# Patient Record
Sex: Female | Born: 1955 | Race: White | Hispanic: No | Marital: Single | Smoking: Never smoker
Health system: Southern US, Community
[De-identification: ages and names within clinical notes are randomized; demographics above are authoritative.]

## PROBLEM LIST (undated history)

## (undated) DIAGNOSIS — C801 Malignant (primary) neoplasm, unspecified: Secondary | ICD-10-CM

## (undated) DIAGNOSIS — R011 Cardiac murmur, unspecified: Secondary | ICD-10-CM

## (undated) DIAGNOSIS — IMO0001 Reserved for inherently not codable concepts without codable children: Secondary | ICD-10-CM

## (undated) DIAGNOSIS — IMO0002 Reserved for concepts with insufficient information to code with codable children: Secondary | ICD-10-CM

## (undated) DIAGNOSIS — N939 Abnormal uterine and vaginal bleeding, unspecified: Secondary | ICD-10-CM

## (undated) DIAGNOSIS — D219 Benign neoplasm of connective and other soft tissue, unspecified: Secondary | ICD-10-CM

## (undated) DIAGNOSIS — D649 Anemia, unspecified: Secondary | ICD-10-CM

## (undated) HISTORY — DX: Abnormal uterine and vaginal bleeding, unspecified: N93.9

## (undated) HISTORY — DX: Reserved for concepts with insufficient information to code with codable children: IMO0002

## (undated) HISTORY — DX: Anemia, unspecified: D64.9

## (undated) HISTORY — DX: Benign neoplasm of connective and other soft tissue, unspecified: D21.9

## (undated) HISTORY — DX: Cardiac murmur, unspecified: R01.1

## (undated) HISTORY — DX: Reserved for inherently not codable concepts without codable children: IMO0001

---

## 2002-11-13 ENCOUNTER — Other Ambulatory Visit: Admission: RE | Admit: 2002-11-13 | Discharge: 2002-11-13 | Payer: Self-pay | Admitting: Obstetrics and Gynecology

## 2004-01-20 ENCOUNTER — Other Ambulatory Visit: Admission: RE | Admit: 2004-01-20 | Discharge: 2004-01-20 | Payer: Self-pay | Admitting: Obstetrics and Gynecology

## 2005-02-03 ENCOUNTER — Other Ambulatory Visit: Admission: RE | Admit: 2005-02-03 | Discharge: 2005-02-03 | Payer: Self-pay | Admitting: Obstetrics and Gynecology

## 2007-10-08 ENCOUNTER — Encounter (INDEPENDENT_AMBULATORY_CARE_PROVIDER_SITE_OTHER): Payer: Self-pay | Admitting: *Deleted

## 2007-10-08 ENCOUNTER — Ambulatory Visit (HOSPITAL_COMMUNITY): Admission: RE | Admit: 2007-10-08 | Discharge: 2007-10-08 | Payer: Self-pay | Admitting: *Deleted

## 2009-06-06 HISTORY — PX: LAPAROSCOPIC GASTRIC SLEEVE RESECTION: SHX5895

## 2010-07-22 ENCOUNTER — Ambulatory Visit: Payer: Self-pay | Admitting: Bariatrics

## 2010-07-22 DIAGNOSIS — R9431 Abnormal electrocardiogram [ECG] [EKG]: Secondary | ICD-10-CM

## 2010-10-19 NOTE — Op Note (Signed)
NAMESURI, TAFOLLA NO.:  [id number]   MEDICAL RECORD NO.:  [id number]          PATIENT TYPE:  AMB   LOCATION:  ENDO                         FACILITY:  [facility]   PHYSICIAN:  Georgiana Spinner, M.D.    DATE OF BIRTH:  01/24/1956   DATE OF PROCEDURE:  10/08/2007  DATE OF DISCHARGE:                               OPERATIVE REPORT   PROCEDURE:  Colonoscopy.   INDICATIONS:  Colon cancer screening.   ANESTHESIA:  Fentanyl 75 mcg, Versed 5 mg.   PROCEDURE:  With the patient mildly sedated in the left lateral  decubitus position the Pentax videoscopic colonoscope was inserted in  the rectum and passed under direct vision with pressure applied to reach  the cecum, identified by ileocecal valve and base of cecum, both of  which were photographed. From this point the colonoscope was slowly  withdrawn, taking circumferential views of colonic mucosa, stopping only  at approximately 25 cm from the anal verge at which point polyps were  seen, photographed and removed.  Using hot biopsy forceps technique,  setting of 20/150 blended current.  We then withdrew all the way to the  rectum which appeared normal on direct and showed hemorrhoids on  retroflexed view. The endoscope was straightened and withdrawn.  The  patient's vital signs, pulse oximeter remained stable.  The patient  tolerated procedure well without apparent complications.   FINDINGS:  Small polyp at 25 cm from the anal verge.  Internal  hemorrhoids, otherwise unremarkable exam.   PLAN:  Await biopsy report.  The patient will call me for results and  follow-up with me as an outpatient.           ______________________________  Georgiana Spinner, M.D.     GMO/MEDQ  D:  10/08/2007  T:  10/08/2007  Job:  [id number]

## 2010-12-22 ENCOUNTER — Ambulatory Visit: Payer: Self-pay | Admitting: Bariatrics

## 2010-12-28 ENCOUNTER — Inpatient Hospital Stay: Payer: Self-pay | Admitting: Bariatrics

## 2010-12-31 LAB — PATHOLOGY REPORT

## 2011-01-24 ENCOUNTER — Ambulatory Visit: Payer: Self-pay | Admitting: Physician Assistant

## 2011-02-05 ENCOUNTER — Ambulatory Visit: Payer: Self-pay | Admitting: Physician Assistant

## 2011-07-19 ENCOUNTER — Other Ambulatory Visit: Payer: Self-pay | Admitting: Internal Medicine

## 2011-08-18 ENCOUNTER — Other Ambulatory Visit: Payer: Self-pay | Admitting: Dermatology

## 2014-06-19 ENCOUNTER — Encounter: Payer: Self-pay | Admitting: Certified Nurse Midwife

## 2014-06-19 ENCOUNTER — Ambulatory Visit (INDEPENDENT_AMBULATORY_CARE_PROVIDER_SITE_OTHER): Payer: Federal, State, Local not specified - PPO | Admitting: Certified Nurse Midwife

## 2014-06-19 VITALS — BP 110/70 | HR 68 | Resp 16 | Ht 60.75 in | Wt 230.0 lb

## 2014-06-19 DIAGNOSIS — Z124 Encounter for screening for malignant neoplasm of cervix: Secondary | ICD-10-CM

## 2014-06-19 DIAGNOSIS — N852 Hypertrophy of uterus: Secondary | ICD-10-CM

## 2014-06-19 DIAGNOSIS — Z01419 Encounter for gynecological examination (general) (routine) without abnormal findings: Secondary | ICD-10-CM

## 2014-06-19 DIAGNOSIS — N950 Postmenopausal bleeding: Secondary | ICD-10-CM

## 2014-06-19 NOTE — Progress Notes (Signed)
Reviewed personally.  M. Suzanne Dalis Beers, MD.  

## 2014-06-19 NOTE — Patient Instructions (Signed)

## 2014-06-19 NOTE — Progress Notes (Signed)
59 y.o. G0P0000 Single  Caucasian Fe here to establish care and for problem of vaginal bleeding post menopausal and for annual exam. Patient was menopausal at age 28. Patient had gastric sleeve at age 52 and had vaginal bleeding off and on 2-3 times yearly in the past 2 years. In 2015 she had slight spotting periodic, but in 9/15 passed a large clot, with bleeding and painful occurrence with one week duration of bleeding. No bleeding since that time. Sees Dr.Pang yearly and labs. Last visit 12/15, all normal.  History of small fibroid years ago. Previous patient of Dr. Quincy Simmonds.  Patient's last menstrual period was 02/04/2014.          Sexually active: No.  The current method of family planning is abstinence.    Exercising: No.  exercise Smoker:  no  Health Maintenance: Pap:  Unsure  5- 6 years ago? MMG:  2011? Colonoscopy:  2012 normal BMD:   unsure TDaP:  2012 Labs: none Self breast exam: not done   reports that she has never smoked. She does not have any smokeless tobacco history on file. She reports that she does not drink alcohol or use illicit drugs.  Past Medical History  Diagnosis Date  . Abnormal uterine bleeding   . Anemia   . Fibroid   . Heart murmur     under 5 yrs of age    Past Surgical History  Procedure Laterality Date  . Laparoscopic gastric sleeve resection      Current Outpatient Prescriptions  Medication Sig Dispense Refill  . meloxicam (MOBIC) 15 MG tablet   3  . hydrocortisone 2.5 % cream      No current facility-administered medications for this visit.    History reviewed. No pertinent family history.  ROS:  Pertinent items are noted in HPI.  Otherwise, a comprehensive ROS was negative.  Exam:   BP 110/70 mmHg  Pulse 68  Resp 16  Ht 5' 0.75" (1.543 m)  Wt 230 lb (104.327 kg)  BMI 43.82 kg/m2  LMP 02/04/2014 Height: 5' 0.75" (154.3 cm) Ht Readings from Last 3 Encounters:  06/19/14 5' 0.75" (1.543 m)    General appearance: alert, cooperative  and appears stated age Head: Normocephalic, without obvious abnormality, atraumatic Neck: no adenopathy, supple, symmetrical, trachea midline and thyroid normal to inspection and palpation Lungs: clear to auscultation bilaterally Breasts: normal appearance, no masses or tenderness, No nipple retraction or dimpling, No nipple discharge or bleeding, No axillary or supraclavicular adenopathy, scar in from left axilla to shoulder due to [facility] revision per patient Heart: regular rate and rhythm Abdomen: very firm to hard with mass slightly mobile, non tender Extremities: extremities normal, atraumatic, no cyanosis or edema Skin: Skin color, texture, turgor normal. No rashes or lesions Lymph nodes: Cervical, supraclavicular, and axillary nodes normal. No abnormal inguinal nodes palpated Neurologic: Grossly normal   Pelvic: External genitalia:  no lesions              Urethra:  normal appearing urethra with no masses, tenderness or lesions              Bartholin's and Skene's: normal                 Vagina: normal appearing vagina with normal color and discharge, no lesions              Cervix: anterior, non tender, no lesions, bleeding with pap  Pap taken: Yes.   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 Discussed enlarge uterus can be a concern with possible fibroid, she had history of years ago,  uterine mass or concerns with cancer. Patient aware could be uterine cancer, "because someone at work has it". Discussed need for PUS and endometrial biopsy. Patient agreeable, but would like to see Dr. Quincy Simmonds who she has past relationship with. Discussed with patient she will be called with insurance information and scheduled.  Encouraged to keep up with any other bleeding episodes and if they are excessive needs to call. Questions answered at length.  Continue follow up as indicated.  Pap smear taken today with HPVHR   counseled on breast self exam, mammography screening stressed and information given to schedule. menopause, adequate intake of calcium and vitamin D, diet and exercise  return annually or prn  An After Visit Summary was printed and given to the patient.

## 2014-06-20 ENCOUNTER — Other Ambulatory Visit: Payer: Self-pay

## 2014-06-20 ENCOUNTER — Telehealth: Payer: Self-pay | Admitting: Certified Nurse Midwife

## 2014-06-20 DIAGNOSIS — N950 Postmenopausal bleeding: Secondary | ICD-10-CM

## 2014-06-20 NOTE — Telephone Encounter (Signed)
Spoke with patient. Advised that per benefit quote received, she will be responsible to $150 copay when she comes in for PUS/EMB. Patient agreeable. Scheduled procedure. Advised patient of 72 hour cancellation policy and $732 cancellation fee. Patient agreeable.

## 2014-06-22 NOTE — Progress Notes (Signed)
I recommend proceeding with a CT scan beginning of this week due to her 34-36 week size mass.  If there are signs of malignancy, we will refer immediately to GYN ONC.  Cc- Dr. Edwinna Areola.

## 2014-06-23 ENCOUNTER — Other Ambulatory Visit: Payer: Self-pay | Admitting: *Deleted

## 2014-06-23 ENCOUNTER — Telehealth: Payer: Self-pay | Admitting: *Deleted

## 2014-06-23 ENCOUNTER — Other Ambulatory Visit: Payer: Self-pay | Admitting: Pediatrics

## 2014-06-23 DIAGNOSIS — R1900 Intra-abdominal and pelvic swelling, mass and lump, unspecified site: Secondary | ICD-10-CM

## 2014-06-23 LAB — IPS PAP TEST WITH HPV

## 2014-06-23 NOTE — Telephone Encounter (Signed)
Per Dr Sabra Heck, recommend patient had CT of abdomen and pelvis with contrast this week prior to pelvic ultrasound. Call to [facility], spoke to [place], appointment scheduled for Wednesday, 06-25-14 at 2:10 pm at [place] location. Will need to go by their office to pick up contrast.   Do you want to speak to the patient about this recommendation?

## 2014-06-23 NOTE — Telephone Encounter (Signed)
Imaging hold placed.

## 2014-06-23 NOTE — Telephone Encounter (Signed)
Reviewed with Debbi. Notify patient of [name] recommendation and appointment. Call to patient. Notified of recommendation and appointment information as well as instruction to pick up contrast. Patient agreeable.  Routing to provider for final review. Patient agreeable to disposition. Will close encounter   cc: Dr Mercy Moore Fast RN for imaging holds.

## 2014-06-25 ENCOUNTER — Telehealth: Payer: Self-pay | Admitting: *Deleted

## 2014-06-25 ENCOUNTER — Ambulatory Visit
Admission: RE | Admit: 2014-06-25 | Discharge: 2014-06-25 | Disposition: A | Discharge status: Home / Self Care | Payer: Federal, State, Local not specified - PPO | Source: Ambulatory Visit | Attending: Obstetrics & Gynecology | Admitting: Obstetrics & Gynecology

## 2014-06-25 DIAGNOSIS — R1900 Intra-abdominal and pelvic swelling, mass and lump, unspecified site: Secondary | ICD-10-CM

## 2014-06-25 MED ORDER — IOHEXOL 300 MG/ML  SOLN
125.0000 mL | Freq: Once | INTRAMUSCULAR | Status: AC | PRN
  Administered 2014-06-25: 125 mL via INTRAVENOUS

## 2014-06-25 NOTE — Telephone Encounter (Signed)
Patient had CT scan of abdomen and pelvic today and they are calling report which is now available in EPIC.  Patient is scheduled for ultrasound with endometrial biopsy next week 07-03-14 with Dr Sabra Heck. Will route call to Dr Sabra Heck for review but also to dr Quincy Simmonds to review this afternoon since Dr Sabra Heck is out of office today. Please advise on next step.

## 2014-06-25 NOTE — Telephone Encounter (Signed)
Opened in error. See previous encounter.  Routing to provider for final review. Patient agreeable to disposition. Will close encounter

## 2014-06-25 NOTE — Telephone Encounter (Signed)
Please have patient come in this week for an endometrial biopsy.

## 2014-06-25 NOTE — Telephone Encounter (Addendum)
Call to patient. Advised we have had cancellation and would like to move up her procedure scheuled for next week.  Endometrial biopsy scheduled for tomorrow 06-26-14 at 1045 with Dr Sabra Heck. Patient is agreeable.  Instructed to take Motrin 800 mg one hour prior with food. She states she does not have Motrin, only has ASA, advised not to take ASA but that we can give her Motrin when she arrives. Per Dr Quincy Simmonds, Dr Sabra Heck to determine if PUS is needed. For now, plan on endometrial biopsy and discussion of results. Appointments for 07-03-14 cancelled. No results given to patient. She did not ask any questions.   Routing to provider for final review. Patient agreeable to disposition. Will close encounter

## 2014-06-26 ENCOUNTER — Ambulatory Visit (INDEPENDENT_AMBULATORY_CARE_PROVIDER_SITE_OTHER): Payer: Federal, State, Local not specified - PPO | Admitting: Obstetrics & Gynecology

## 2014-06-26 VITALS — BP 124/80 | HR 60 | Resp 16 | Wt 228.0 lb

## 2014-06-26 DIAGNOSIS — N852 Hypertrophy of uterus: Secondary | ICD-10-CM

## 2014-06-26 DIAGNOSIS — R9389 Abnormal findings on diagnostic imaging of other specified body structures: Secondary | ICD-10-CM

## 2014-06-26 DIAGNOSIS — R938 Abnormal findings on diagnostic imaging of other specified body structures: Secondary | ICD-10-CM

## 2014-06-26 DIAGNOSIS — N950 Postmenopausal bleeding: Secondary | ICD-10-CM

## 2014-06-30 ENCOUNTER — Telehealth: Payer: Self-pay | Admitting: *Deleted

## 2014-06-30 DIAGNOSIS — N852 Hypertrophy of uterus: Secondary | ICD-10-CM

## 2014-06-30 NOTE — Telephone Encounter (Signed)
-----   Message from Lyman Speller, MD sent at 06/30/2014  9:31 AM EST ----- Please call pt.  Biopsy was non diagnostic.  Needs to be repeated.  If cannot get enough tissue, will need to do D&C but I would like to try in office one more time.  Thanks.

## 2014-06-30 NOTE — Telephone Encounter (Addendum)
Reviewed with Dr Sabra Heck. Call to patient and notified of results (see result note). Repeat Endometrial biopsy scheduled for 07-01-14 with Dr Sabra Heck. Patient declined appointment today since she is on her way into work. Reminded of instructions to take Motrin 800 mg one hour prior to procedure with food.

## 2014-07-01 ENCOUNTER — Ambulatory Visit (INDEPENDENT_AMBULATORY_CARE_PROVIDER_SITE_OTHER): Payer: Federal, State, Local not specified - PPO | Admitting: Obstetrics & Gynecology

## 2014-07-01 VITALS — BP 118/80 | HR 64 | Resp 16 | Wt 230.4 lb

## 2014-07-01 DIAGNOSIS — N852 Hypertrophy of uterus: Secondary | ICD-10-CM

## 2014-07-01 DIAGNOSIS — N950 Postmenopausal bleeding: Secondary | ICD-10-CM

## 2014-07-03 ENCOUNTER — Telehealth: Payer: Self-pay | Admitting: Certified Nurse Midwife

## 2014-07-03 ENCOUNTER — Other Ambulatory Visit: Payer: Federal, State, Local not specified - PPO | Admitting: Obstetrics & Gynecology

## 2014-07-03 ENCOUNTER — Encounter: Payer: Self-pay | Admitting: Obstetrics & Gynecology

## 2014-07-03 ENCOUNTER — Other Ambulatory Visit: Payer: Federal, State, Local not specified - PPO

## 2014-07-03 NOTE — Telephone Encounter (Signed)
Dr.Miller please review and advise results from 07/01/2014.

## 2014-07-03 NOTE — Telephone Encounter (Signed)
Pt is calling to see if her results are in.

## 2014-07-03 NOTE — Progress Notes (Signed)
Very nice 59 yo G0 SWF here for repeat endometrial biopsy due to inadequate tissue in sample obatined 06/26/14.  Procedure discussed again with pt.  All questions answered.  Reports she has bleed a little over the weekend since last biopsy but has done well and wasn't really concerned with the bleeding.  Verbal and written consent obtained.    Procedure:  Speculum placed.  Cervix visualized and cleansed with betadine prep.  A single toothed tenaculum was applied to the anterior lip of the cervix.  Endometrial pipelle was advanced through the cervix into the endometrial cavity without difficulty.  Pipelle passed to 9cm.  Suction applied and pipelle removed with blood in pipelle.  It did appear there was good tissue in this, however.  Pt did have a fair amount of bleeding after first pass with biopsy pipelle.  Then second pipelle obatined with syringe on the end.  Passed a second time with suction applied.  10cc blood/tissue obtained.  This was put with first sample and sent for pathology analysis.    Pt did experience significant bleeding after biopsy.  She was watched and eventually bleeding subsided.  Pt was asymptomatic throughout procedure and pt tolerated procedure well.  Assessment:  Enlarged uterus, PMP bleeding, thickened endometrium, one prior biopsy with inadequate tissue  Plan:  Biopsy pending.  If not enough tissue, will plan D&C in OR.

## 2014-07-03 NOTE — Telephone Encounter (Signed)
Call to patient. Advised of results of endometrial biopsy as directed by Dr. Sabra Heck and recommendation for [facility] at hospital. Patient feels her body has "woke up" after being on Depo-Provera and she has just had a normal period. Advised that evaluation of endometrial lining will indicate hormonal imbalance versus abnormal cells. Advised postmenopausal bleeding is always considered abnormal until proven otherwise and abnormal cells must be ruled out. Offered to proceed with 07-07-14 if desired and patient is agreeable.  Office surgical instruction sheet reviewed with patient. Unable to mail printed copy since procedure is Monday.  Routing to provider for final review. Patient agreeable to disposition. Will close encounter

## 2014-07-03 NOTE — Telephone Encounter (Signed)
Return call to patient, VM confirm phone number. Left message to call back and ask for [facility]. Results reviewed with Dr Sabra Heck. Endometrial biopsy non-diagnostic. Recommends proceed with D&C.

## 2014-07-03 NOTE — Progress Notes (Signed)
Subjective:     Patient ID: Erin Santiago, female   DOB: [birth year], 59 y.o.   MRN: [id number]  HPI Very nice 59 yo G0 SWF here for endometrial biopsy and disucssion of CT scan obtained 06/25/14.  Pt recently seen by NP, Debbi Hollice Espy, who noted enlarged uterus on physical exam.  Due to size felt by NP, felt CT was better test for additional evaluation, instead of PUS.  Pt has intermittent bleeding off and on over last two years.  In 9/15, she did have heavier bleeding and passed a large clot.  Has not had any evaluation.  Pt reports she has several theories about her bleeding and discussed those with me today, including possible yeast as a cause.  Reviewed CT with pt which showed enlarged 16 x 13 x 14cm uterus without distinct mass.  No adnexal lesions noted.  Bilateral pelvic sidewall adenopathy noted.  Cystic/solid masses within endometrial cavity noted.  Possibly submucosal fibroid, due to cervical stenosis, or endometrial cancer.D/W pt uterus is enlarged and endometrium appears abnormal and we need to proceed with biopsy.  She is comfortable with this.  Denies pelvic pain, pressure, urinary symptoms, bowel changes.  She has had weight loss due to a gastric sleeve but nothing else.    Pt does not appear/act worried at all by the above.  Review of Systems  All other systems reviewed and are negative.      Objective:   Physical Exam  Constitutional: She is oriented to person, place, and time. She appears well-developed and well-nourished.  Cardiovascular: Normal rate and regular rhythm.   Pulmonary/Chest: Effort normal and breath sounds normal.  Abdominal: Soft. Bowel sounds are normal. She exhibits mass (about 18 week sized uterus). She exhibits no distension. There is no tenderness. There is no rebound and no guarding.  Genitourinary: There is no rash, tenderness, lesion or injury on the right labia. There is no rash, tenderness, lesion or injury on the left labia. Uterus is not enlarged (about  18 weeks, mobile), not fixed and not tender. Cervix exhibits no motion tenderness and no discharge. Right adnexum displays no mass and no tenderness. Left adnexum displays no mass and no tenderness. There is bleeding (light, dark) in the vagina.  Musculoskeletal: Normal range of motion.  Lymphadenopathy:       Right: No inguinal adenopathy present.       Left: No inguinal adenopathy present.  Neurological: She is alert and oriented to person, place, and time.  Skin: Skin is warm and dry.   Endometrial biopsy recommended.  Discussed with patient.  Verbal and written consent obtained.   Procedure:  Speculum placed.  Cervix visualized and cleansed with betadine prep.  A single toothed tenaculum was applied to the anterior lip of the cervix.  Milex dilator required to dilate cervix initially as endometrial pipelle would not pass.  Blood noted with dilation of cervix, immediately.  Then, endometrial pipelle was advanced through the cervix into the endometrial cavity without difficulty.  Pipelle passed to 9cm.  Suction applied and pipelle removed with blood noted in sample.  Repeat biopsy obtained.  Pt did have a little bleeding with procedure that stopped quickly.   obtained.  Tenculum removed.  No bleeding noted.  Patient tolerated procedure well.      Assessment:     Enlarged uterus, endometrial mass, PMP bleeding     Plan:     Biopsy pending.  Pt will be called with results.

## 2014-07-04 ENCOUNTER — Inpatient Hospital Stay (HOSPITAL_COMMUNITY)
Admission: EM | Admit: 2014-07-04 | Discharge: 2014-07-05 | DRG: 761 | Disposition: A | Discharge status: Home / Self Care | Payer: Federal, State, Local not specified - PPO | Attending: Internal Medicine | Admitting: Internal Medicine

## 2014-07-04 ENCOUNTER — Ambulatory Visit (INDEPENDENT_AMBULATORY_CARE_PROVIDER_SITE_OTHER): Payer: Federal, State, Local not specified - PPO | Admitting: Obstetrics & Gynecology

## 2014-07-04 ENCOUNTER — Encounter (HOSPITAL_COMMUNITY): Payer: Self-pay | Admitting: Emergency Medicine

## 2014-07-04 ENCOUNTER — Telehealth: Payer: Self-pay | Admitting: Obstetrics & Gynecology

## 2014-07-04 ENCOUNTER — Encounter: Payer: Self-pay | Admitting: Obstetrics & Gynecology

## 2014-07-04 ENCOUNTER — Telehealth: Payer: Self-pay | Admitting: *Deleted

## 2014-07-04 DIAGNOSIS — D219 Benign neoplasm of connective and other soft tissue, unspecified: Secondary | ICD-10-CM | POA: Insufficient documentation

## 2014-07-04 DIAGNOSIS — N939 Abnormal uterine and vaginal bleeding, unspecified: Secondary | ICD-10-CM | POA: Diagnosis present

## 2014-07-04 DIAGNOSIS — M1990 Unspecified osteoarthritis, unspecified site: Secondary | ICD-10-CM | POA: Diagnosis present

## 2014-07-04 DIAGNOSIS — N852 Hypertrophy of uterus: Secondary | ICD-10-CM | POA: Diagnosis present

## 2014-07-04 DIAGNOSIS — D649 Anemia, unspecified: Secondary | ICD-10-CM | POA: Diagnosis present

## 2014-07-04 DIAGNOSIS — N950 Postmenopausal bleeding: Secondary | ICD-10-CM | POA: Diagnosis present

## 2014-07-04 DIAGNOSIS — Z90710 Acquired absence of both cervix and uterus: Secondary | ICD-10-CM

## 2014-07-04 DIAGNOSIS — D72829 Elevated white blood cell count, unspecified: Secondary | ICD-10-CM | POA: Insufficient documentation

## 2014-07-04 DIAGNOSIS — D62 Acute posthemorrhagic anemia: Secondary | ICD-10-CM | POA: Insufficient documentation

## 2014-07-04 LAB — CBC WITH DIFFERENTIAL/PLATELET
BASOS ABS: 0.1 10*3/uL (ref 0.0–0.1)
BASOS PCT: 0 % (ref 0–1)
EOS ABS: 0.6 10*3/uL (ref 0.0–0.7)
EOS PCT: 5 % (ref 0–5)
HCT: 36.9 % (ref 36.0–46.0)
Hemoglobin: 11.8 g/dL — ABNORMAL LOW (ref 12.0–15.0)
Lymphocytes Relative: 18 % (ref 12–46)
Lymphs Abs: 2.2 10*3/uL (ref 0.7–4.0)
MCH: 27.6 pg (ref 26.0–34.0)
MCHC: 32 g/dL (ref 30.0–36.0)
MCV: 86.2 fL (ref 78.0–100.0)
Monocytes Absolute: 0.7 10*3/uL (ref 0.1–1.0)
Monocytes Relative: 6 % (ref 3–12)
NEUTROS PCT: 71 % (ref 43–77)
Neutro Abs: 8.6 10*3/uL — ABNORMAL HIGH (ref 1.7–7.7)
PLATELETS: 312 10*3/uL (ref 150–400)
RBC: 4.28 MIL/uL (ref 3.87–5.11)
RDW: 14.6 % (ref 11.5–15.5)
WBC: 12.1 10*3/uL — ABNORMAL HIGH (ref 4.0–10.5)

## 2014-07-04 LAB — BASIC METABOLIC PANEL
ANION GAP: 7 (ref 5–15)
BUN: 18 mg/dL (ref 6–23)
CHLORIDE: 107 mmol/L (ref 96–112)
CO2: 26 mmol/L (ref 19–32)
CREATININE: 0.77 mg/dL (ref 0.50–1.10)
Calcium: 9 mg/dL (ref 8.4–10.5)
GFR calc Af Amer: >90 mL/min (ref >90)
GFR calc non Af Amer: >90 mL/min (ref >90)
GLUCOSE: 94 mg/dL (ref 70–99)
POTASSIUM: 3.7 mmol/L (ref 3.5–5.1)
SODIUM: 140 mmol/L (ref 135–145)

## 2014-07-04 LAB — PROTIME-INR
INR: 1.07 (ref 0.00–1.49)
PROTHROMBIN TIME: 14 s (ref 11.6–15.2)

## 2014-07-04 LAB — APTT: aPTT: 31 seconds (ref 24–37)

## 2014-07-04 MED ORDER — HEPARIN SODIUM (PORCINE) 5000 UNIT/ML IJ SOLN
5000.0000 [IU] | Freq: Three times a day (TID) | INTRAMUSCULAR | Status: DC
  Administered 2014-07-04 – 2014-07-05 (×2): 5000 [IU] via SUBCUTANEOUS
  Filled 2014-07-04 (×5): qty 1

## 2014-07-04 MED ORDER — HYDROCORTISONE 2.5 % RE CREA
1.0000 "application " | TOPICAL_CREAM | RECTAL | Status: DC | PRN
  Filled 2014-07-04: qty 28.35

## 2014-07-04 MED ORDER — SODIUM CHLORIDE 0.9 % IV SOLN
INTRAVENOUS | Status: DC
  Administered 2014-07-04: 22:00:00 via INTRAVENOUS

## 2014-07-04 MED ORDER — GUAIFENESIN 100 MG/5ML PO SYRP
100.0000 mg | ORAL_SOLUTION | Freq: Four times a day (QID) | ORAL | Status: DC | PRN
  Filled 2014-07-04: qty 5

## 2014-07-04 MED ORDER — ONDANSETRON HCL 4 MG PO TABS: 4.0000 mg | ORAL_TABLET | Freq: Four times a day (QID) | ORAL | Status: DC | PRN

## 2014-07-04 MED ORDER — ONDANSETRON HCL 4 MG/2ML IJ SOLN: 4.0000 mg | Freq: Four times a day (QID) | INTRAMUSCULAR | Status: DC | PRN

## 2014-07-04 NOTE — ED Notes (Signed)
Called to give report nurse unavailable will call back.  

## 2014-07-04 NOTE — H&P (Signed)
[facility] History and Physical  Erin Santiago WJX:[id number] DOB: 10-Feb-1956 DOA: 07/04/2014  Referring physician: ED physician PCP: Tommy Medal, MD  Specialists:   Chief Complaint: Vaginal bleeding  HPI: Erin Santiago is a 59 y.o. female with past medical history of anemia, fibroid, who presents with vaginal bleeding.  Patient reports that he had menopause at age of 30. After that she had vaginal bleeding in 02/2014, but did not seek for treatment. She noticed that her uterus is enlarged in 05/2014. She has had two attempted biopsies by Dr. Sabra Heck on 06/26/14 and 07/01/14, but both times did have not enough tissues collected to be diagnostic. She also had CT-abd/pelvis which was worrisome for endometrial carcinoma. Dr. Sabra Heck spoke with Dr. Denman George- rad/onc who recommended she comes to [facility] ED to be admitted by [facility]. She is no longer bleeding. She has mild abdominal cramp, but no nausea, vomiting, diarrhea, abdominal pain. She reports that her body weight increased slightly recently. Patient has mild cough with clear mucus production. No chest pain or shortness of breath. Patient denies fever, chills, fatigue, headaches, dysuria, urgency, frequency, hematuria, skin rashes, joint pain or leg swelling.  Work up in the ED demonstrates leukocytosis with WBC 12.1. Temperature normal. Hemoglobin 11.8. Patient is admitted to inpatient for the fusion to treatment. Dr. Denman George was consulted.  Review of Systems: As presented in the history of presenting illness, rest negative.  Where does patient live?  At home Can patient participate in ADLs? Yes  Allergy: No Known Allergies  Past Medical History  Diagnosis Date  . Abnormal uterine bleeding   . Anemia   . Fibroid   . Heart murmur     under 5 yrs of age    Past Surgical History  Procedure Laterality Date  . Laparoscopic gastric sleeve resection      Social History:  reports that she has never smoked. She does not have any  smokeless tobacco history on file. She reports that she does not drink alcohol or use illicit drugs.  Family History:  Family History  Problem Relation Age of Onset  . Hypertension Mother   . Diabetes Mother   . Thyroid disease Father   . Heart attack Father   . Other Father     enlarged heart     Prior to Admission medications   Medication Sig Start Date End Date Taking? Authorizing Provider  GUAIFENESIN PO Take 2 tablets by mouth 2 (two) times daily as needed (cough and cold).    Yes Historical Provider, MD  hydrocortisone 2.5 % cream Apply 1 application topically as needed (itch).  05/19/14  Yes Historical Provider, MD  ibuprofen (ADVIL,MOTRIN) 200 MG tablet Take 600 mg by mouth every 6 (six) hours as needed for moderate pain.   Yes Historical Provider, MD  meloxicam (MOBIC) 15 MG tablet Take 15 mg by mouth daily.  06/09/14  Yes Historical Provider, MD    Physical Exam: Filed Vitals:   07/04/14 1818 07/04/14 2051 07/04/14 2300 07/05/14 0509  BP: 139/73 127/83 125/60 139/81  Pulse: 84 78 71 72  Temp:  98.6 F (37 C) 98.8 F (37.1 C) 98.5 F (36.9 C)  TempSrc:  Oral Oral Oral  Resp: 16 16 16 16   SpO2: 96% 97% 97% 94%   General: Not in acute distress HEENT:       Eyes: PERRL, EOMI, no scleral icterus       ENT: No discharge from the ears and nose, no pharynx injection, no tonsillar enlargement.  Neck: No JVD, no bruit, no mass felt. Cardiac: S1/S2, RRR, No murmurs, No gallops or rubs Pulm: Good air movement bilaterally. Clear to auscultation bilaterally. No rales, wheezing, rhonchi or rubs. Abd: Soft, mildly distended, nontender, no rebound pain, no organomegaly, BS present Ext: No edema bilaterally. 2+DP/PT pulse bilaterally Musculoskeletal: No joint deformities, erythema, or stiffness, ROM full Skin: No rashes.  Neuro: Alert and oriented X3, cranial nerves II-XII grossly intact, muscle strength 5/5 in all extremeties, sensation to light touch intact.  Psych:  Patient is not psychotic, no suicidal or hemocidal ideation.  Labs on Admission:  Basic Metabolic Panel:  Recent Labs Lab 07/04/14 1833  NA 140  K 3.7  CL 107  CO2 26  GLUCOSE 94  BUN 18  CREATININE 0.77  CALCIUM 9.0   Liver Function Tests: No results for input(s): AST, ALT, ALKPHOS, BILITOT, PROT, ALBUMIN in the last 168 hours. No results for input(s): LIPASE, AMYLASE in the last 168 hours. No results for input(s): AMMONIA in the last 168 hours. CBC:  Recent Labs Lab 07/04/14 1833  WBC 12.1*  NEUTROABS 8.6*  HGB 11.8*  HCT 36.9  MCV 86.2  PLT 312   Cardiac Enzymes: No results for input(s): CKTOTAL, CKMB, CKMBINDEX, TROPONINI in the last 168 hours.  BNP (last 3 results) No results for input(s): PROBNP in the last 8760 hours. CBG: No results for input(s): GLUCAP in the last 168 hours.  Radiological Exams on Admission: No results found.  EKG: not done  Assessment/Plan Principal Problem:   Vaginal bleeding Active Problems:   Anemia   Fibroid   Postmenopausal vaginal bleeding  Postmenopausal pulse vaginal bleeding: She has had two attempted biopsies by Dr. Sabra Heck on 06/26/14 and 07/01/14, but both times did have not enough tissues collected to be diagnostic. She also had CT-abd/pelvis which was worrisome for endometrial carcinoma. Dr. Denman George was consulted. Hemoglobin 11.8. Hemodynamically stable. The patient is not actively bleeding currently. -will admit med-surg  Bed -NPO - NS: 75 cc/h -get INR/PTT -CBC daily -type and screen -follow up Dr. Alan Ripper recommendation.   Anemia: likely due to vaginal bleeding -check anemia panel  Indeterminate punctate (approximately 3 mm) nodule within in the left upper lobe: Showed on CT- abdomen/pelvis. -Patient has a low risk, no follow-up is needed usually if she did not have cancer, but given her current possible endometrial cancer, may need to rule out metastases. -follow up Dr. Alan Ripper recommendation.    DVT ppx: SQ  Heparin  Code Status: Full code Family Communication: Yes, patient's   friend    at bed side Disposition Plan: Admit to inpatient   Date of Service 07/05/2014    Ivor Costa [facility] Pager [phone]  If 7PM-7AM, please contact night-coverage [email or web address] Password [email or web address] 07/05/2014, 5:28 AM

## 2014-07-04 NOTE — Telephone Encounter (Signed)
Call from patient in waiting room of [facility] emergency department. States she has still not been seen, there are four patients ahead of her and multiple patients continued to arrive. She feels bleeding has slowed and she is wants to leave. Would like to proceed with outpatient D&C on Monday at [facility] as previously scheduled. Advised this is not recommended. Bleeding has likely slowed due to her decreased activity from sitting in waiting room. As soon as she goes home and resumes normal activity, bleeding will may become heavy again and she would have to return to ED and begin wait all over. We also don't want this to become an emergency situation. Stressed importance to stay at ED for evaluation and begin treatment plan as discussed by Dr Sabra Heck. Patient states she understands and says she will try to be more patient. Rolla Etienne, RN present in my office for phone call.  Routing to provider for final review. Patient agreeable to disposition. Will close encounter

## 2014-07-04 NOTE — Telephone Encounter (Signed)
Patient has a D&C scheduled Monday 07/07/14 and has bleeding, please call ASAP. Last seen 07/01/14. (No chart)

## 2014-07-04 NOTE — ED Notes (Addendum)
Patient c/o vaginal bleeding (going through 3-4 pads every 2 hours, has subsided at this time). Denies dizziness. Had vaginal bleeding in September ("last real period") and has not had another occurrence until now. Has had two attempted biopsies (unable to acquire enough tissue). Also has uterine enlargement. MD referred patient here and is "worried about uterine cancer." Wants radiology-oncology and OB-GYN to be consulted. No other complaints/concerns.

## 2014-07-04 NOTE — Telephone Encounter (Signed)
Spoke with patient. Patient states that she was told to go to the hospital. Is currently waiting for a ride at work. Advised patient that I have spoken with Dr.Miller and she recommends that the patient come directly to our office for evaluation. Patient is agreeable. Patient will head directly to our office. Will be here within the next hour. Placed patient on the schedule for 07/04/2014 at 1:30pm with Dr.Miller as a work in. [place] aware.  Routing to provider for final review. Patient agreeable to disposition. Will close encounter

## 2014-07-04 NOTE — Telephone Encounter (Signed)
Spoke with patient. Patient states that she is on the other line with the [facility] who is advising her to go to the emergency room. Patient states she has been bleeding heavily since 10 am with lots of blood clots. "I am having to change my pad every twenty minutes. She told me to go to the hospital and they may to the surgery today. I do not want to lose her call. Can I call you back?" Patient will return call after speaking with hospital.

## 2014-07-04 NOTE — Progress Notes (Signed)
59 yo G0 SWF with enlarged 18 week sized uterus and PMP bleeding with CT findings concerning for endometrial cancer presents to office with significant bleeding.  Attempt at biopsy was made both 06/26/14 and then again on 1/26.  Both specimens were non-diagnostic due to so much blood.  Pt has had some spotting since last attempted biopsy but today bleeding significantly increased and is now very heavy.  She is passing clots and changing large maxi pad every hour.  Not lightheaded or dizzy.  No SOB.  No palpitations.  Pt very nonchalant about how much bleeding she is having.  Significant CT findings from 06/25/14 showed:  Marked expansion of the endometrial canal by a complex, partially cystic, partially solid apparently endometrial based mass - while potentially representative of a solitary submucosal fibroid or retained blood products as a sequela of chronic cervical stenosis, the overall appearance is worrisome for endometrial carcinoma. Further evaluation with endometrial biopsy and/or pelvic MRI is recommended. 2. Bilateral pelvic sidewall adenopathy, while potentially reactive due to patient body habitus, metastatic disease is not excluded on the basis of this examination. Otherwise, no evidence of metastatic disease to the abdomen or pelvis. 3. Indeterminate punctate (approximately 3 mm) nodule within in the left upper lobe. If the patient is at high risk for bronchogenic carcinoma, follow-up chest CT at 1 year is recommended. If the patient is at low risk, no follow-up is needed.  Hb, fingerstick: 12.7 today.  Exam:  WNWD, obese WF, NAD CV:  Normal rate Lungs: CTA Gyn: NAEFG but significant blood present.  With placement of speculum, blood fills speculum.  Dark and with clots.  Speculum removed.  Called gyn/onc--Dr. Denman George who advised sending pt to [facility] ER for admission through [facility] and consultations to gyn/onc and radiation oncology.  Pt is a non-surgical candidate  and will need radiation for treatment.  [place] [facility] and spoke with triage RN to provide information.

## 2014-07-04 NOTE — ED Provider Notes (Signed)
CSN: [id number]     Arrival date & time 07/04/14  1405 History   First MD Initiated Contact with Patient 07/04/14 1719     Chief Complaint  Patient presents with  . Vaginal Bleeding     (Consider location/radiation/quality/duration/timing/severity/associated sxs/prior Treatment) HPI    PCP: PANG,RICHARD, MD Blood pressure 149/97, pulse 100, temperature 98.1 F (36.7 C), temperature source Oral, resp. rate 17, last menstrual period 02/04/2014, SpO2 100 %.  Erin Santiago is a 59 y.o.female with a significant PMH of abnormal uterine bleeding, anemia, fibroid, heart murmur presents to the ER with complaints of vaginal bleeding. She was seen by Lyman Speller, MD today regarding the bleeding. At this time the patient was actively passing blood. They are concerned that she may have uterine cancer due to enlarged uterus. She has had two attempted biopsies but both times not enough tissue was collected to be diagnostic. Her MD says she is not a surgical candidate, the patient request I perform hysterectomy. Dr. Sabra Heck spoke with Dr. Denman George- rad/onc who recommended she comes to [facility] ED to be admitted by [facility]. She is no longer bleeding. 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 Otherwise, declines currently having any medical complaints.    Past Medical History  Diagnosis Date  . Abnormal uterine bleeding   . Anemia   . Fibroid   . Heart murmur     under 5 yrs of age   Past Surgical History  Procedure Laterality Date  . Laparoscopic gastric sleeve resection     Family History  Problem Relation Age of Onset  . Hypertension Mother   . Diabetes Mother   . Thyroid disease Father   . Heart attack Father   . Other Father     enlarged heart   History  Substance Use Topics  . Smoking status: Never Smoker   . Smokeless tobacco: Not on file  . Alcohol Use: No   OB History    Gravida Para Term Preterm AB TAB SAB Ectopic  Multiple Living   0 0 0 0 0 0 0 0 0 0      Review of Systems  10 Systems reviewed and are negative for acute change except as noted in the HPI.    Allergies  Review of patient's allergies indicates no known allergies.  Home Medications   Prior to Admission medications   Medication Sig Start Date End Date Taking? Authorizing Provider  GUAIFENESIN PO Take 2 tablets by mouth 2 (two) times daily as needed (cough and cold).    Yes Historical Provider, MD  hydrocortisone 2.5 % cream Apply 1 application topically as needed (itch).  05/19/14  Yes Historical Provider, MD  ibuprofen (ADVIL,MOTRIN) 200 MG tablet Take 600 mg by mouth every 6 (six) hours as needed for moderate pain.   Yes Historical Provider, MD  meloxicam (MOBIC) 15 MG tablet Take 15 mg by mouth daily.  06/09/14  Yes Historical Provider, MD   BP 139/73 mmHg  Pulse 84  Temp(Src) 98.1 F (36.7 C) (Oral)  Resp 16  SpO2 96%  LMP 02/04/2014 Physical Exam  Constitutional: She appears well-developed and well-nourished. No distress.  HENT:  Head: Normocephalic and atraumatic.  Eyes: Pupils are equal, round, and reactive to light.  Neck: Normal range of motion. Neck supple.  Cardiovascular: Normal rate and regular rhythm.   Pulmonary/Chest: Effort normal.  Abdominal: Soft.  Neurological: She is alert.  Skin: Skin is  warm and dry.  Nursing note and vitals reviewed.   ED Course  Procedures (including critical care time) Labs Review Labs Reviewed  CBC WITH DIFFERENTIAL/PLATELET - Abnormal; Notable for the following:    WBC 12.1 (*)    Hemoglobin 11.8 (*)    Neutro Abs 8.6 (*)    All other components within normal limits  BASIC METABOLIC PANEL    Imaging Review No results found.   EKG Interpretation None      MDM   Final diagnoses:  Vaginal bleeding    7:45 pm I spoke directly with Dr. Everitt Amber regarding the patient, she does want admission for this patient. Her CT scan is highly suggestive of metastatic  uterine cancer. She will see her in the morning. Will admit when labs result. Patient agreeable to the plan and the admission.  Patient has remained stable in the ED with a hemoglobin of 12.1 and WNL vital signs.   8:12 PM- Dr. Ivor Costa with [facility] has agreed to admit patient, [facility] admits, medsurg  Filed Vitals:   07/04/14 1818  BP: 139/73  Pulse: 84  Temp:   Resp: [address], PA-C 07/04/14 2013  Linus Mako, PA-C 07/04/14 2023  Dorie Rank, MD 07/05/14 [phone]

## 2014-07-05 DIAGNOSIS — D62 Acute posthemorrhagic anemia: Secondary | ICD-10-CM | POA: Insufficient documentation

## 2014-07-05 DIAGNOSIS — C541 Malignant neoplasm of endometrium: Secondary | ICD-10-CM

## 2014-07-05 DIAGNOSIS — D72829 Elevated white blood cell count, unspecified: Secondary | ICD-10-CM

## 2014-07-05 LAB — BASIC METABOLIC PANEL
Anion gap: 7 (ref 5–15)
BUN: 16 mg/dL (ref 6–23)
CALCIUM: 8.5 mg/dL (ref 8.4–10.5)
CO2: 26 mmol/L (ref 19–32)
Chloride: 108 mmol/L (ref 96–112)
Creatinine, Ser: 0.79 mg/dL (ref 0.50–1.10)
GFR, EST NON AFRICAN AMERICAN: 89 mL/min — AB (ref >90)
Glucose, Bld: 101 mg/dL — ABNORMAL HIGH (ref 70–99)
Potassium: 4.1 mmol/L (ref 3.5–5.1)
Sodium: 141 mmol/L (ref 135–145)

## 2014-07-05 LAB — CBC
HCT: 33.6 % — ABNORMAL LOW (ref 36.0–46.0)
Hemoglobin: 10.6 g/dL — ABNORMAL LOW (ref 12.0–15.0)
MCH: 27.4 pg (ref 26.0–34.0)
MCHC: 31.5 g/dL (ref 30.0–36.0)
MCV: 86.8 fL (ref 78.0–100.0)
Platelets: 288 10*3/uL (ref 150–400)
RBC: 3.87 MIL/uL (ref 3.87–5.11)
RDW: 15 % (ref 11.5–15.5)
WBC: 8.8 10*3/uL (ref 4.0–10.5)

## 2014-07-05 LAB — TYPE AND SCREEN
ABO/RH(D): O POS
Antibody Screen: NEGATIVE

## 2014-07-05 LAB — ABO/RH: ABO/RH(D): O POS

## 2014-07-05 MED ORDER — FERROUS SULFATE 325 (65 FE) MG PO TABS: 325.0000 mg | ORAL_TABLET | Freq: Every day | ORAL | Status: DC

## 2014-07-05 NOTE — Progress Notes (Signed)
Pt discharged to home. DC instructions given . No concerns voiced. Prescription x 1 given for iron. Left unit in wheelchair pushed by this rn. Left in good condition. Picked up by caucasian female in red SUV. Vwilliams,rn.

## 2014-07-05 NOTE — Consult Note (Signed)
Consult Note: Gyn-Onc  Consult was requested by Dr. Blaine Hamper for the evaluation of Erin Santiago 59 y.o. female with heavy postmenopausal vaginal bleeding, uterine   CC:  Chief Complaint  Patient presents with  . Vaginal Bleeding    Assessment/Plan:  Erin. Erin Santiago  is a 60 y.o.  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  I performed a history, physical examination, and personally reviewed the patient's imaging films including the CT scan of the abdomen and pelvis.   I discussed with Erin Santiago that we do not have histologic confirmation of the cancer which is likely secondary to the biopsies yielding only necrotic tissue which is not uncommon in the setting of such a large mass. However, given our high suspicion from imaging findngs and the fact that she has such significant bleeding, I am recommending with proceeding with surgical intervention (total abdominal hysterectomy, BSO) prior to establishing that diagnosis. A frozen section can be performed on the endometrium at the time of hysterectomy to confirm the diagnosis, and, if confirmed, a debulking versus biopsy of lymph nodes can be attempted. I discussed that due to the bulky size of her uterus she is not a candidate for minimally invasive approaches, and will require a midline vertical laparotomy. I discussed operative risks including  bleeding, infection, damage to internal organs (such as bladder,ureters, bowels), blood clot, reoperation and rehospitalization. I discussed that she is at increased risk for these complications due to her morbid obesity, particularly for the risk of wound infection which can be as high as 50% after "open" hysterectomy.  Due to her recent bleeding history, I am recommending we expedite surgery sooner than can be accomplished here in [place], and I will explore options to have her hysterectomy be performed at [facility].   I believe her bleeding is stable and her hemoglobin and vitals are stable  enough for her to be discharged to home today. I recommend she be discharged on iron replacement therapy. We will notify the patient of her surgical appointments and plan by phone. Our office contact information is:   Dr. Everitt Amber at [phone]  HPI: Erin Santiago is a 59 year old G0 morbidly obese woman who is seen in consultation at the request of Dr Blaine Hamper and Dr Edwinna Areola for heavy vaginal bleeding, grossly enlarged uterus and lymphadenopathy.  The patient reports vaginal bleeding since September 2015. She has a remote history of menorrhagia (treated with depoprovera) but after natural menopause (at age 28) had no bleeding until last year. In January, 2016 she sought consultation with Dr Sabra Heck for the bleeding. Dr Sabra Heck performed 2 office biopsy procedures in order to establish a diagnosis of cancer, however, the biopsies were nondiagnostic.   A CT of the chest, abdomen and pelvis was performed on 06/25/14:  The endometrial canal is markedly expanded with an expansile ill-defined apparent endometrial based mass which is complex, partially cystic with peripheral enhancing nodular tissue and central calcifications. The uterus is enlarged measuring approximately 16.4 x 13.5 x 14.4 cm, though a definitive uterine mass is not identified. No discrete adnexal lesion. No free fluid in the pelvic cul-de-sac. Normal appearance of the urinary bladder given degree Distention. Bilateral pelvic sidewall adenopathy with index right iliac chain lymph node measuring 1 cm in greatest short axis diameter and an index left iliac chain lymph node measuring 1 cm. Shotty bilateral inguinal lymph nodes are individually not enlarged by size criteria and presumably reactive secondary to patient body Habitus. No definite  retroperitoneal or mesenteric adenopathy with scattered shot porta hepatis lymph nodes are presumably reactive in etiology with index porta hepatis node measuring 1.3 cm in greatest short axis diameter.  Scattered shotty retroperitoneal lymph nodes are individually not enlarged by size criteria with index left-sided periaortic lymph node measuring 0.7 cm. Left upper lobe noncalcified nodule (53mm)  The most recent biopsy was performed on 07/01/13 and after this biopsy the patient's light vaginal bleeding became much heavier, with her passing large clots and flooding pads. She was seen and evaluated by Dr Sabra Heck on 07/04/13 who recommended she be admitted for observation and management. In the subsequent 12 hours the patient's bleeding spontaneously improved to very light flow. Her Hb was relatively stable at 11.6-10.6. She has normal vital signs with no hemodynamic compromise and is asymptomatic for anemia.   She denies pain, change in bowel habit, LE edema.  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 She has never been pregnant. Her only major surgery was a gastric sleeve (laparoscopic) 3 years ago after which she lost 50lbs then plateaued. She is otherwise healthy with no DM, HTN, CAD (she had cardiac testing before her bariatric surgery). 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  Interval History: Her bleeding has substantially spontaneously lessened.   Current Meds: No medication comments found.   Medication List    ASK your doctor about these medications        GUAIFENESIN PO  Take 2 tablets by mouth 2 (two) times daily as needed (cough and cold).     hydrocortisone 2.5 % cream  Apply 1 application topically as needed (itch).     ibuprofen 200 MG tablet  Commonly known as:  ADVIL,MOTRIN  Take 600 mg by mouth every 6 (six) hours as needed for moderate pain.     meloxicam 15 MG tablet  Commonly known as:  MOBIC  Take 15 mg by mouth daily.        Allergy: No Known Allergies  Social Hx:   History   Social History  . Marital Status: Single    Spouse Name: N/A    Number of Children: N/A  . Years of  Education: N/A   Occupational History  . Not on file.   Social History Main Topics  . Smoking status: Never Smoker   . Smokeless tobacco: Not on file  . Alcohol Use: No  . Drug Use: No  . Sexual Activity: No   Other Topics Concern  . Not on file   Social History Narrative    Past Surgical Hx:  Past Surgical History  Procedure Laterality Date  . Laparoscopic gastric sleeve resection      Past Medical Hx:  Past Medical History  Diagnosis Date  . Abnormal uterine bleeding   . Anemia   . Fibroid   . Heart murmur     under 5 yrs of age    Past Gynecological History:  Menorrhagia, menopause age 79. G0.  Patient's last menstrual period was 02/04/2014.  Family Hx:  Family History  Problem Relation Age of Onset  . Hypertension Mother   . Diabetes Mother   . Thyroid disease Father   . Heart attack Father   . Other Father     enlarged heart    Review of Systems:  Constitutional  Feels well,    ENT Normal appearing ears and nares bilaterally Skin/Breast  No rash, sores, jaundice, itching, dryness  Cardiovascular  No chest pain, shortness of breath, or edema  Pulmonary  No cough or wheeze.  Gastro Intestinal  No nausea, vomitting, or diarrhoea. No bright red blood per rectum, no abdominal pain, change in bowel movement, or constipation.  Genito Urinary  No frequency, urgency, dysuria, see HPI Musculo Skeletal  No myalgia, arthralgia, joint swelling or pain  Neurologic  No weakness, numbness, change in gait,  Psychology  No depression, anxiety, insomnia.   Vitals:  Blood pressure 139/81, pulse 72, temperature 98.5 F (36.9 C), temperature source Oral, resp. rate 16, last menstrual period 02/04/2014, SpO2 94 %.  Physical Exam: WD in NAD Neck  Supple NROM, without any enlargements.  Lymph Node Survey No cervical supraclavicular or inguinal adenopathy Cardiovascular  Pulse normal rate, regularity and rhythm. S1 and S2 normal.  Lungs  Clear to  auscultation bilateraly, without wheezes/crackles/rhonchi. Good air movement.  Skin  No rash/lesions/breakdown  Psychiatry  Alert and oriented to person, place, and time  Abdomen  Normoactive bowel sounds, abdomen soft, non-tender and obese without evidence of hernia. Palpable uterine fundus above the level of the umbilicus.  Back No CVA tenderness Genito Urinary  Vulva/vagina: Normal external female genitalia.  No lesions. No discharge or bleeding.  Bladder/urethra:  No lesions or masses, well supported bladder  Vagina: palpably normal (speculum not used)  Cervix: small, smooth, no palpable lesions or involvement  Uterus:  Large - extends above the pelvis, fills abdomen to above umbilcus. Regular shape. Minimally mobile.  Adnexa: no discrete palpable masses. Rectal  Good tone, no masses no cul de sac nodularity.  Extremities  No bilateral cyanosis, clubbing or edema.  CBC    Component Value Date/Time   WBC 8.8 07/05/2014 0551   RBC 3.87 07/05/2014 0551   HGB 10.6* 07/05/2014 0551   HCT 33.6* 07/05/2014 0551   PLT 288 07/05/2014 0551   MCV 86.8 07/05/2014 0551   MCH 27.4 07/05/2014 0551   MCHC 31.5 07/05/2014 0551   RDW 15.0 07/05/2014 0551   LYMPHSABS 2.2 07/04/2014 1833   MONOABS 0.7 07/04/2014 1833   EOSABS 0.6 07/04/2014 1833   BASOSABS 0.1 07/04/2014 1833      Donaciano Eva, MD   07/05/2014, 11:21 AM   (Phone) [phone]

## 2014-07-05 NOTE — Discharge Instructions (Signed)
Abnormal Uterine Bleeding Abnormal uterine bleeding can affect women at various stages in life, including teenagers, women in their reproductive years, pregnant women, and women who have reached menopause. Several kinds of uterine bleeding are considered abnormal, including:  Bleeding or spotting between periods.   Bleeding after sexual intercourse.   Bleeding that is heavier or more than normal.   Periods that last longer than usual.  Bleeding after menopause.  Many cases of abnormal uterine bleeding are minor and simple to treat, while others are more serious. Any type of abnormal bleeding should be evaluated by your health care provider. Treatment will depend on the cause of the bleeding. HOME CARE INSTRUCTIONS Monitor your condition for any changes. The following actions may help to alleviate any discomfort you are experiencing:  Avoid the use of tampons and douches as directed by your health care provider.  Change your pads frequently. You should get regular pelvic exams and Pap tests. Keep all follow-up appointments for diagnostic tests as directed by your health care provider.  SEEK MEDICAL CARE IF:   Your bleeding lasts more than 1 week.   You feel dizzy at times.  SEEK IMMEDIATE MEDICAL CARE IF:   You pass out.   You are changing pads every 15 to 30 minutes.   You have abdominal pain.  You have a fever.   You become sweaty or weak.   You are passing large blood clots from the vagina.   You start to feel nauseous and vomit. MAKE SURE YOU:   Understand these instructions.  Will watch your condition.  Will get help right away if you are not doing well or get worse. Document Released: 05/23/2005 Document Revised: 05/28/2013 Document Reviewed: 12/20/2012 Exit[employer] Patient Information 2015 [employer]. This information is not intended to replace advice given to you by your health care provider. Make sure you discuss any questions you have with your  health care provider.  

## 2014-07-05 NOTE — Discharge Summary (Signed)
Physician Discharge Summary  Erin Santiago IOX:[id number] DOB: 1955-10-01 DOA: 07/04/2014  PCP: Tommy Medal, MD  Admit date: 07/04/2014 Discharge date: 07/05/2014  Recommendations for Outpatient Follow-up:  Patient arranged to have surgery at [facility] on Monday, Feb 8 with Dr. Marlaine Hind. [facility] to contact the patient about pre-op appt.  Discharge Diagnoses:  Principal Problem:   Postmenopausal vaginal bleeding Active Problems:   Anemia    Discharge Condition: stable   Diet recommendation: as tolerated   History of present illness:  59 y.o. female with past medical history of anemia, fibroid, who presented with vaginal bleeding. Her initial post menopausal bleeding was in 02/2014 at which time she did not seek medical care. In 05/2014 she noticed her uterus was more distended. She has had two attempted biopsies by Dr. Sabra Heck on 06/26/14 and 07/01/14, but both times did have not enough tissues collected to be diagnostic. Patient also had CT-abd/pelvis which was worrisome for endometrial carcinoma.  On admission, her bleeding has resolved. She did have mild abdominal tenderness to palpation but otherwise she felt ok and wanted to be discharged home.    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  - based on imaging studies there is a high suspicion for endometrial carcinoma and recommendation is to proceed with surgical intervention (total abdominal hysterectomy, BSO)  - Patient arranged to have surgery at [facility] on Monday, Feb 8 with Dr. Marlaine Hind. [facility] to contact the patient about pre-op appt.  SignedLeisa Lenz, MD  [facility] 07/05/2014, 1:08 PM  Pager #: [phone]  Procedures: None    Consultations: GYN surgery / oncology Dr. Everitt Amber  Discharge Exam: Filed Vitals:   07/05/14 0509  BP: 139/81  Pulse: 72  Temp: 98.5 F (36.9 C)  Resp: 16   Filed Vitals:   07/04/14 1818 07/04/14 2051 07/04/14 2300 07/05/14 0509  BP: 139/73 127/83 125/60 139/81  Pulse: 84 78 71 72  Temp:  98.6 F (37 C) 98.8 F (37.1 C) 98.5 F (36.9 C)  TempSrc:  Oral Oral Oral  Resp: 16 16 16 16   SpO2: 96% 97% 97% 94%    General: Pt is alert, follows commands appropriately, not in acute distress Cardiovascular: Regular rate and rhythm, S1/S2 +, no murmurs Respiratory: Clear to auscultation bilaterally, no wheezing, no crackles, no rhonchi Abdominal: md abdominal pain, non distended, bowel sounds +, no guarding Extremities: no edema, no cyanosis, pulses palpable bilaterally DP and PT Neuro: Grossly nonfocal  Discharge Instructions  Discharge Instructions    Call MD for:  difficulty breathing, headache or visual disturbances    Complete by:  As directed      Call MD for:  persistant dizziness or light-headedness    Complete by:  As directed      Call MD for:  persistant nausea and vomiting    Complete by:  As directed      Call MD for:  severe uncontrolled pain    Complete by:  As directed      Diet - low sodium heart healthy    Complete by:  As directed      Discharge instructions    Complete by:  As directed   Please follow up with Dr. Denman George for surgery per  their recommendations.     Increase activity slowly    Complete by:  As directed             Medication List    TAKE these medications        ferrous sulfate 325 (65 FE) MG tablet  Take 1 tablet (325 mg total) by mouth daily with breakfast.     GUAIFENESIN PO  Take 2 tablets by mouth 2 (two) times daily as needed (cough and cold).     hydrocortisone 2.5 % cream  Apply 1 application topically as needed (itch).     ibuprofen 200 MG tablet  Commonly known as:  ADVIL,MOTRIN  Take 600 mg by mouth every 6  (six) hours as needed for moderate pain.     meloxicam 15 MG tablet  Commonly known as:  MOBIC  Take 15 mg by mouth daily.           Follow-up Information    Follow up with Donaciano Eva, MD.   Specialty:  Obstetrics and Gynecology   Contact information:   [place] [address] [phone]        The results of significant diagnostics from this hospitalization (including imaging, microbiology, ancillary and laboratory) are listed below for reference.    Significant Diagnostic Studies: Ct Abdomen Pelvis W Contrast  06/25/2014   CLINICAL DATA:  Pelvic/uterine mass felt on physical examination. Vaginal bleeding for 4 months. Remote history of gastric sleeve surgery (2011).  EXAM: CT ABDOMEN AND PELVIS WITH CONTRAST  TECHNIQUE: Multidetector CT imaging of the abdomen and pelvis was performed using the standard protocol following bolus administration of intravenous contrast.  CONTRAST:  161mL OMNIPAQUE IOHEXOL 300 MG/ML  SOLN  COMPARISON:  None  FINDINGS: The endometrial canal is markedly expanded with an expansile ill-defined apparent endometrial based mass which is complex, partially cystic with peripheral enhancing nodular tissue and central calcifications. The uterus is enlarged measuring approximately 16.4 x 13.5 x 14.4 cm (as measured in greatest oblique sagittal - image 92, series 41 and axial- image 72, series 2) dimensions respectively, though a definitive uterine mass is not identified. No discrete adnexal lesion. No free fluid in the pelvic cul-de-sac. Normal appearance of the urinary bladder given degree distention.  Bilateral pelvic sidewall adenopathy with index right iliac chain lymph node measuring 1 cm in greatest short axis diameter (image 78, series 2 an index left iliac chain lymph node measuring 1 cm (image 77).  Shotty bilateral inguinal lymph nodes are individually not enlarged by size criteria and presumably reactive secondary to patient body habitus.   No definite retroperitoneal or mesenteric adenopathy with scattered shot porta hepatis lymph nodes are presumably reactive in etiology with index porta hepatis node measuring 1.3 cm in greatest short axis diameter (image 35, series 2). Scattered shotty retroperitoneal lymph nodes are individually not enlarged by size criteria with index left-sided periaortic lymph node measuring 0.7 cm (image 52, series 2).  ---------------------------------------------------------  Normal hepatic contour. There is a minimal amount of focal fatty infiltration adjacent to the fissure for ligamentum teres. No discrete hepatic lesions. Normal appearance of the gallbladder given degree of distention. No intra or extrahepatic biliary duct dilatation. No ascites.  There is symmetric enhancement and excretion of the bilateral kidneys. No definite renal stones on this postcontrast examination. No discrete renal lesions. No urinary obstruction or perinephric stranding. There is a very minimal amount of grossly symmetric bilateral perinephric stranding, likely body habitus related. Normal appearance of the  bilateral adrenal glands, pancreas and spleen.  Post surgical repair of the gastric fundus compatible with provided history of gastric sleeve surgery. Ingestion enteric contrast extends to the level of the splenic flexure of the colon. Scattered minimal colonic diverticulosis without evidence of diverticulitis. The bowel is otherwise normal in course and caliber without wall thickening or evidence of obstruction. Normal appearance of the appendix. No pneumoperitoneum, pneumatosis or portal venous gas.  Scattered minimal atherosclerotic plaque within a mildly tortuous but normal caliber abdominal aorta. The major branch vessels of the abdominal aorta appear widely patent on this non CTA examination.  Limited visualization of the lower thorax demonstrates minimal ground-glass atelectasis within the imaged bilateral lung bases. No focal  airspace opacities. No pleural effusion.  Note is made of a punctate (approximately 3 mm) noncalcified nodule within in the caudal aspect of the left upper lobe (image 1, series 3).  Normal heart size. Calcifications within the mitral valve annulus. No pericardial effusion.  No acute or aggressive osseus abnormalities. Mild to moderate multilevel lumbar spine DDD, worse at L2-L3 with disc space height loss, endplate irregularity and sclerosis.  A solitary shotty subcutaneous presumed lymph node is noted about the left flank measuring 0.6 cm in diameter (image 40, series 2), presumably reactive in etiology. Regional soft tissues appear otherwise normal.  IMPRESSION: 1. Marked expansion of the endometrial canal by a complex, partially cystic, partially solid apparently endometrial based mass - while potentially representative of a solitary submucosal fibroid or retained blood products as a sequela of chronic cervical stenosis, the overall appearance is worrisome for endometrial carcinoma. Further evaluation with endometrial biopsy and/or pelvic MRI is recommended. 2. Bilateral pelvic sidewall adenopathy, while potentially reactive due to patient body habitus, metastatic disease is not excluded on the basis of this examination. Otherwise, no evidence of metastatic disease to the abdomen or pelvis. 3. Indeterminate punctate (approximately 3 mm) nodule within in the left upper lobe. If the patient is at high risk for bronchogenic carcinoma, follow-up chest CT at 1 year is recommended. If the patient is at low risk, no follow-up is needed. This recommendation follows the consensus statement: Guidelines for Management of Small Pulmonary Nodules Detected on CT Scans: A Statement from the [employer] as published in Radiology 2005; 237:395-400. These results will be called to the ordering clinician or representative by the Radiologist Assistant, and communication documented in the PACS or zVision Dashboard.    Electronically Signed   By: Sandi Mariscal M.D.   On: 06/25/2014 15:53    Microbiology: No results found for this or any previous visit (from the past 240 hour(s)).   Labs: Basic Metabolic Panel:  Recent Labs Lab 07/04/14 1833 07/05/14 0551  NA 140 141  K 3.7 4.1  CL 107 108  CO2 26 26  GLUCOSE 94 101*  BUN 18 16  CREATININE 0.77 0.79  CALCIUM 9.0 8.5   Liver Function Tests: No results for input(s): AST, ALT, ALKPHOS, BILITOT, PROT, ALBUMIN in the last 168 hours. No results for input(s): LIPASE, AMYLASE in the last 168 hours. No results for input(s): AMMONIA in the last 168 hours. CBC:  Recent Labs Lab 07/04/14 1833 07/05/14 0551  WBC 12.1* 8.8  NEUTROABS 8.6*  --   HGB 11.8* 10.6*  HCT 36.9 33.6*  MCV 86.2 86.8  PLT 312 288   Cardiac Enzymes: No results for input(s): CKTOTAL, CKMB, CKMBINDEX, TROPONINI in the last 168 hours. BNP: BNP (last 3 results) No results for input(s): PROBNP in the last  8760 hours. CBG: No results for input(s): GLUCAP in the last 168 hours.  Time coordinating discharge: Over 30 minutes

## 2014-07-07 ENCOUNTER — Ambulatory Visit (HOSPITAL_COMMUNITY)
Admission: RE | Admit: 2014-07-07 | Payer: Federal, State, Local not specified - PPO | Source: Ambulatory Visit | Admitting: Obstetrics & Gynecology

## 2014-07-07 ENCOUNTER — Encounter (HOSPITAL_COMMUNITY): Admission: RE | Payer: Self-pay | Source: Ambulatory Visit

## 2014-07-07 HISTORY — PX: ABDOMINAL HYSTERECTOMY: SHX81

## 2014-07-07 SURGERY — DILATION AND CURETTAGE
Anesthesia: Choice

## 2014-07-08 ENCOUNTER — Encounter: Payer: Self-pay | Admitting: Gynecologic Oncology

## 2014-07-09 NOTE — Progress Notes (Signed)
Patient arranged to have surgery at [facility] on Monday, Feb 8 with Dr. Marlaine Hind.  [facility] to contact the patient about pre-op appt.

## 2014-07-23 ENCOUNTER — Ambulatory Visit: Payer: Federal, State, Local not specified - PPO | Attending: Gynecologic Oncology | Admitting: Gynecologic Oncology

## 2014-07-23 ENCOUNTER — Encounter: Payer: Self-pay | Admitting: Gynecologic Oncology

## 2014-07-23 VITALS — BP 131/74 | HR 75 | Temp 97.8°F | Resp 18 | Ht 60.75 in | Wt 228.6 lb

## 2014-07-23 DIAGNOSIS — C541 Malignant neoplasm of endometrium: Secondary | ICD-10-CM | POA: Insufficient documentation

## 2014-07-23 DIAGNOSIS — Z4802 Encounter for removal of sutures: Secondary | ICD-10-CM | POA: Diagnosis not present

## 2014-07-23 DIAGNOSIS — Z90722 Acquired absence of ovaries, bilateral: Secondary | ICD-10-CM | POA: Insufficient documentation

## 2014-07-23 DIAGNOSIS — Z90710 Acquired absence of both cervix and uterus: Secondary | ICD-10-CM | POA: Insufficient documentation

## 2014-07-23 DIAGNOSIS — Z79891 Long term (current) use of opiate analgesic: Secondary | ICD-10-CM | POA: Insufficient documentation

## 2014-07-23 DIAGNOSIS — Z7901 Long term (current) use of anticoagulants: Secondary | ICD-10-CM | POA: Diagnosis not present

## 2014-07-23 NOTE — Patient Instructions (Signed)
Please call for any issues with your incision.  Continue using your binder.  Plan to follow up on Monday with Dr. Denman George

## 2014-07-23 NOTE — Progress Notes (Signed)
Follow Up Note: Gyn-Onc  Erin Santiago 59 y.o. female  CC:  Chief Complaint  Patient presents with  . Routine Post Op    Follow up    HPI:  Erin Santiago is a 59 year old female initially seen by Dr. Denman George inpatient with heavy vaginal bleeding, grossly enlarged uterus and lymphadenopathy. The patient reports vaginal bleeding since September 2015. She has a remote history of menorrhagia and had no bleeding until last year. In January of this year she sought consultation for the bleeding and underwent a biopsy, however the biopsies were nondiagnostic. A CT scan was done which showed an enlarged uterus as well as bilateral sidewall adenopathy. In addition, there was some lymph nodes that were thought to be reactive in the left peri-aortic region as well as in the porta hepatis region.  On 07/14/14, she underwent a total abdominal hysterectomy, bilateral salpingo-oophorectomy, pelvic and para-aortic LN sampling/debulking, omental biopsy at [facility] by Dr. Marlaine Hind.  Operative findings include: 20wk size bulky but mobile uterus. Normal tubes, ovaries. Multiple enlarged pelvic LN palpated. Smaller firm <1cm PA nodes. 1cm firm omental nodule. Normal small and large bowel. No ascites, carcinomatosis. Intra-operative frozen section consistent with sarcoma with no epithelial component identified.  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  Histologic grade: High grade Tumor site: Posterior myometrium Tumor size (gross): 18 cm in greatest dimension  Serosal involvement: Not involved, tumor is 1 mm from the serosal surface Cervical involvement: Not involved Other involved sites: No other sites are involved Cervical margin and distance: Widely negative, at least 3.2 cm  Lymphovascular space invasion: Present   Regional lymph nodes (from parts C-E): Total number involved: 8  Total number examined: 45 Additional pathologic findings:  - Endometrium: Inactive appearing endometrium with cystic atrophy - Right ovary: Epithelial inclusion cysts with associated calcifications and physiologic changes - Left ovary: Epithelial inclusion cysts with associated calcifications and physiologic changes  - Right fallopian tube: Unremarkable fallopian tube with paratubal endosalpingiosis and W[place]ard nests (see comment) - Left fallopian tube: No specific pathologic abnormality  B: Cervix, trachelectomy  - Chronic cervicitis with squamous metaplasia and reactive epithelial changes  - No malignancy identified C: Lymph node, right pelvic, removal  - Two of thirteen lymph nodes positive for metastatic sarcoma (2/13) D: Lymph node, left pelvic, removal  - Six of twenty four lymph nodes positive for metastatic sarcoma (6/24) E: Lymph node, periaortic, removal  - Eight lymph nodes negative for metastatic sarcoma (0/8) F: Omentum, biopsy  - Omentum with no malignancy identified  Comment: Histologic examination reveals a large tumor arising in the posterior myometrium and composed of cells with diffuse moderate to severe atypia. Tumor cells are arranged in short fascicles, in storiform pattern, and in solid sheets. Necrosis is prominent and up to 26 mitotic figures/10 HPF are identified. Focal smooth muscle differentiation and myxoid change are identified morphologically (immunostains were not performed on the block with morphologic smooth muscle differentiation). Immunohistochemical stains were performed on block A12 in an attempt to further characterize the sarcoma. Tumor cells are strongly and diffusely positive for CD10, scattered cells are positive for smooth muscle actin, and scattered cells are positive for desmin. Tumor cells are negative for myogenin and cytokeratin AE1/AE3. Morphology and immunostaining pattern are  consistent with a high grade endometrial stromal sarcoma.  A cluster of benign glands, some with tubal  epithelium, surrounded by dense stroma are seen in the right paratubal tissue. Immunostain for CD10 is negative in the stroma and glands are thought to represent endosalpingiosis.   Interval History:  She presents today for staple removal.  She has an appointment with Dr. Denman George on Monday but she wanted to have her staples out sooner.  She reports doing well since surgery.  Tolerating diet with no nausea or emesis.  Ambulating without difficulty.  Voiding without difficulty and passing flatus.  Bowels moving adequately.  Minimal pain reported.  No vaginal bleeding or spotting reported.  She is worried about her incision opening up when her staples are removed.  No other concerns voiced.     Review of Systems  Constitutional: Feels well.  No fever, chills, early satiety, unintentional weight loss or gain.    Cardiovascular: No chest pain, shortness of breath, or edema.  Pulmonary: No cough or wheeze.  Gastrointestinal: No nausea, vomiting, or diarrhea. No bright red blood per rectum or change in bowel movement.  Genitourinary: No frequency, urgency, or dysuria. No vaginal bleeding or discharge.  Musculoskeletal: No myalgia or joint pain. Neurologic: No weakness, numbness, or change in gait.  Psychology: No depression, anxiety, or insomnia.  Current Meds:  Outpatient Encounter Prescriptions as of 07/23/2014  Medication Sig  . docusate sodium (COLACE) 100 MG capsule Take 100 mg by mouth.  . enoxaparin (LOVENOX) 40 MG/0.4ML injection Inject 40 mg into the skin.  . ferrous sulfate 325 (65 FE) MG tablet Take 1 tablet (325 mg total) by mouth daily with breakfast.  . GUAIFENESIN PO Take 2 tablets by mouth 2 (two) times daily as needed (cough and cold).   . hydrocortisone 2.5 % cream Apply 1 application topically as needed (itch).   Marland Kitchen ibuprofen (ADVIL,MOTRIN) 800 MG tablet Take 800 mg by mouth.  .  meloxicam (MOBIC) 15 MG tablet Take 15 mg by mouth daily.   Marland Kitchen oxyCODONE-acetaminophen (PERCOCET/ROXICET) 5-325 MG per tablet Take by mouth.  . [DISCONTINUED] ibuprofen (ADVIL,MOTRIN) 200 MG tablet Take 600 mg by mouth every 6 (six) hours as needed for moderate pain.    Allergy: No Known Allergies  Social Hx:   History   Social History  . Marital Status: Single    Spouse Name: N/A  . Number of Children: N/A  . Years of Education: N/A   Occupational History  . Not on file.   Social History Main Topics  . Smoking status: Never Smoker   . Smokeless tobacco: Not on file  . Alcohol Use: No  . Drug Use: No  . Sexual Activity: No   Other Topics Concern  . Not on file   Social History Narrative    Past Surgical Hx:  Past Surgical History  Procedure Laterality Date  . Laparoscopic gastric sleeve resection    . Abdominal hysterectomy  07/2014    at [facility]    Past Medical Hx:  Past Medical History  Diagnosis Date  . Abnormal uterine bleeding   . Anemia   . Fibroid   . Heart murmur     under 5 yrs of age    Family Hx:  Family History  Problem Relation Age of Onset  . Hypertension Mother   . Diabetes Mother   . Thyroid disease Father   . Heart attack Father   . Other Father     enlarged heart    Vitals:  Blood pressure 131/74, pulse 75, temperature 97.8 F (36.6 C), resp. rate 18, height 5' 0.75" (  1.543 m), weight 228 lb 9.6 oz (103.692 kg), last menstrual period 02/04/2014.  Physical Exam:  General: Well developed, well nourished female in no acute distress. Alert and oriented x 3.  Cardiovascular: Regular rate and rhythm. S1 and S2 normal.  Lungs: Clear to auscultation bilaterally. No wheezes/crackles/rhonchi noted.  Skin: No rashes or lesions present.  Scattered ecchymosis from lovenox injections. Back: No CVA tenderness.  Abdomen: Abdomen soft, non-tender and obese. Active bowel sounds in all quadrants. 25 staples removed without difficulty.  No drainage or  erythema present.  Incision intact with no evidence of wound separation after patient ambulating and getting dressed.  1/2 inch steri strips applied. Extremities: No bilateral cyanosis, edema, or clubbing.   Assessment/Plan:  59 year old s/p total abdominal hysterectomy, bilateral salpingo-oophorectomy, pelvic and para-aortic LN sampling/debulking, omental biopsy at [facility] by Dr. 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  She is to keep her appointment with Dr. Denman George on Monday to discuss pathology and recommendations.  She is doing well post-operatively.  Incision care discussed.  Reportable signs and symptoms reviewed.  She is advised to call for any questions or concerns.     CROSS, MELISSA DEAL, NP 07/23/2014, 1:25 PM

## 2014-07-28 ENCOUNTER — Encounter: Payer: Self-pay | Admitting: Gynecologic Oncology

## 2014-07-28 ENCOUNTER — Telehealth: Payer: Self-pay | Admitting: Gynecologic Oncology

## 2014-07-28 ENCOUNTER — Ambulatory Visit: Payer: Federal, State, Local not specified - PPO | Attending: Gynecologic Oncology | Admitting: Gynecologic Oncology

## 2014-07-28 VITALS — BP 130/60 | HR 75 | Temp 98.4°F | Resp 20 | Ht 60.0 in | Wt 227.2 lb

## 2014-07-28 DIAGNOSIS — C775 Secondary and unspecified malignant neoplasm of intrapelvic lymph nodes: Secondary | ICD-10-CM | POA: Insufficient documentation

## 2014-07-28 DIAGNOSIS — Z90710 Acquired absence of both cervix and uterus: Secondary | ICD-10-CM | POA: Insufficient documentation

## 2014-07-28 DIAGNOSIS — Z48816 Encounter for surgical aftercare following surgery on the genitourinary system: Secondary | ICD-10-CM | POA: Insufficient documentation

## 2014-07-28 DIAGNOSIS — Z483 Aftercare following surgery for neoplasm: Secondary | ICD-10-CM

## 2014-07-28 DIAGNOSIS — C541 Malignant neoplasm of endometrium: Secondary | ICD-10-CM | POA: Insufficient documentation

## 2014-07-28 DIAGNOSIS — Z90722 Acquired absence of ovaries, bilateral: Secondary | ICD-10-CM | POA: Diagnosis not present

## 2014-07-28 NOTE — Telephone Encounter (Signed)
, °

## 2014-07-28 NOTE — Patient Instructions (Signed)
Scheduling will call you with your appoint to see Dr. Marko Plume

## 2014-07-28 NOTE — Progress Notes (Signed)
POSTOPERATIVE FOLLOWUP VISIT  HPI:  Erin Santiago is a 59 y.o. year old [id number] initially seen in consultation on 07/05/14, referred by Dr Edwinna Areola for vaginal bleeding after biopsy.  She then underwent an exploratory laparotomy and TAH, BSO, pelvic and para-aortic lymphadenectomy and omentectomy on 10/11/30 without complications.  Her surgery was performed by [name] at [facility]. Her postoperative course was uncomplicated.  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 LVSI was present. The cervical margin and cervix were uninvolved. 8 of 45 lymph nodes (in the bilateral pelvic basins) were positive for metastatic disease. The para-aortic nodes were negative (8 sampled). The omentum was negative for tumor.  She is seen today for a postoperative check and to discuss her pathology results and ongoing plan.  Since discharge from the hospital, she is feeling well.  She has improving appetite, normal bowel and bladder function, and pain controlled with minimal PO medication. She has no other complaints today.    Review of systems: Constitutional:  She has no weight gain or weight loss. She has no fever or chills. Eyes: No blurred vision Ears, Nose, Mouth, Throat: No dizziness, headaches or changes in hearing. No mouth sores. Cardiovascular: No chest pain, palpitations or edema. Respiratory:  No shortness of breath, wheezing or cough Gastrointestinal: She has normal bowel movements without diarrhea or constipation. She denies any nausea or vomiting. She denies blood in her stool or heart burn. Genitourinary:  She denies pelvic pain, pelvic pressure or changes in her urinary function. She has no hematuria, dysuria, or incontinence. She has no irregular vaginal bleeding or vaginal discharge Musculoskeletal: Denies muscle weakness or joint pains.  Skin:  She has no skin changes, rashes or  itching Neurological:  Denies dizziness or headaches. No neuropathy, no numbness or tingling. Psychiatric:  She denies depression or anxiety. Hematologic/Lymphatic:   No easy bruising or bleeding   Physical Exam: Blood pressure 130/60, pulse 75, temperature 98.4 F (36.9 C), temperature source Oral, resp. rate 20, height 5' (1.524 m), weight 227 lb 3.2 oz (103.057 kg), last menstrual period 02/04/2014. General: Well dressed, well nourished in no apparent distress.   HEENT:  Normocephalic and atraumatic, no lesions.  Extraocular muscles intact. Sclerae anicteric. Pupils equal, round, reactive. No mouth sores or ulcers. Thyroid is normal size, not nodular, midline. Skin:  No lesions or rashes. Breasts:  Soft, symmetric.  No skin or nipple changes.  No palpable LN or masses. Lungs:  Clear to auscultation bilaterally.  No wheezes. Cardiovascular:  Regular rate and rhythm.  No murmurs or rubs. Abdomen:  Soft, nontender, nondistended.  No palpable masses.  No hepatosplenomegaly.  No ascites. Normal bowel sounds.  No hernias.  Incision is healing well. There is mild erythema in the inferior portion, but no drainage or purulence. Genitourinary: Normal EGBUS  Vaginal cuff intact.  No bleeding or discharge.  No cul de sac fullness. Extremities: No cyanosis, clubbing or edema.  No calf tenderness or erythema. No palpable cords. Psychiatric: Mood and affect are appropriate. Neurological: Awake, alert and oriented x 3. Sensation is intact, no neuropathy.  Musculoskeletal: No pain, normal strength and range of motion.  Assessment:    59 y.o. 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   S/p TAH, BSO, lymphadenectomy on 07/14/14.   Plan: - Pathology reports reviewed today - Treatment counseling - I discussed  that her case was reviewed at the [facility] tumor board conference and recommendations for the following were made:  1/  ER/PR assessment of the tumor (pending) 2/ PET CT imaging to rule out distant mets and residual measurable disease. 3/ consideration for systemic adjuvant therapy either  A) hormonal therapy with megace or and AI.  B) cytotoxic chemotherapy with either Gemzaar + Taxotere+ Adriamycin or Ifos/Cis/Doxorubicin. I would favor the former. 4/ consideration for whole pelvic RT after chemotherapy. I am favoring giving cytotoxic chemotherapy over hormonal therapies as first line adjuvant, given that she had such a large burden of primary and metastatic disease and the high grade nature of the cell type. I would consider then placing her on maintenance hormonal therapy (eg megace) after chemotherapy is complete. We will consider the role for WPRT after she has completed adjuvant chemotherapy. If her PET shows distant mets, I believe the toxicity of radiation out weighs the benefits.  We will facilitate a referral to Dr Evlyn Clines to explore her chemotherapy options further. She was given the opportunity to ask questions, which were answered to her satisfaction, and she is agreement with the above mentioned plan of care.  -  Return to clinic after completing chemotherapy for discussion regarding hormonal therapy maintenance vs radiation.  Donaciano Eva, MD

## 2014-07-30 ENCOUNTER — Telehealth: Payer: Self-pay | Admitting: Oncology

## 2014-07-30 NOTE — Telephone Encounter (Signed)
S/W PT IN REF TO NP APPT. ON 08/18/14@11 :00 CHEMO ED-08/11/14@10 :00

## 2014-08-01 ENCOUNTER — Ambulatory Visit (HOSPITAL_COMMUNITY)
Admission: RE | Admit: 2014-08-01 | Discharge: 2014-08-01 | Disposition: A | Discharge status: Home / Self Care | Payer: Federal, State, Local not specified - PPO | Source: Ambulatory Visit | Attending: Gynecologic Oncology | Admitting: Gynecologic Oncology

## 2014-08-01 DIAGNOSIS — C541 Malignant neoplasm of endometrium: Secondary | ICD-10-CM | POA: Insufficient documentation

## 2014-08-01 LAB — GLUCOSE, CAPILLARY: Glucose-Capillary: 83 mg/dL (ref 70–99)

## 2014-08-01 MED ORDER — FLUDEOXYGLUCOSE F - 18 (FDG) INJECTION
12.2000 | Freq: Once | INTRAVENOUS | Status: AC | PRN
  Administered 2014-08-01: 12.2 via INTRAVENOUS

## 2014-08-05 ENCOUNTER — Telehealth: Payer: Self-pay | Admitting: Gynecologic Oncology

## 2014-08-05 NOTE — Telephone Encounter (Signed)
Patient calling requesting PET scan results.  Informed of results and Dr. Serita Grit recommendations to proceed with chemotherapy.  Advised to call for any questions or concerns.

## 2014-08-11 ENCOUNTER — Other Ambulatory Visit: Payer: Federal, State, Local not specified - PPO

## 2014-08-17 ENCOUNTER — Other Ambulatory Visit: Payer: Self-pay | Admitting: Oncology

## 2014-08-17 DIAGNOSIS — C541 Malignant neoplasm of endometrium: Secondary | ICD-10-CM

## 2014-08-18 ENCOUNTER — Telehealth: Payer: Self-pay

## 2014-08-18 ENCOUNTER — Ambulatory Visit: Payer: Federal, State, Local not specified - PPO

## 2014-08-18 ENCOUNTER — Ambulatory Visit (HOSPITAL_BASED_OUTPATIENT_CLINIC_OR_DEPARTMENT_OTHER): Payer: Federal, State, Local not specified - PPO | Admitting: Oncology

## 2014-08-18 ENCOUNTER — Other Ambulatory Visit: Payer: Federal, State, Local not specified - PPO

## 2014-08-18 ENCOUNTER — Ambulatory Visit: Payer: Federal, State, Local not specified - PPO | Admitting: Oncology

## 2014-08-18 ENCOUNTER — Other Ambulatory Visit (HOSPITAL_BASED_OUTPATIENT_CLINIC_OR_DEPARTMENT_OTHER): Payer: Federal, State, Local not specified - PPO

## 2014-08-18 ENCOUNTER — Encounter: Payer: Self-pay | Admitting: Oncology

## 2014-08-18 VITALS — BP 132/63 | HR 75 | Temp 98.2°F | Resp 19 | Ht 60.0 in | Wt 227.9 lb

## 2014-08-18 DIAGNOSIS — I878 Other specified disorders of veins: Secondary | ICD-10-CM

## 2014-08-18 DIAGNOSIS — M170 Bilateral primary osteoarthritis of knee: Secondary | ICD-10-CM

## 2014-08-18 DIAGNOSIS — Z9884 Bariatric surgery status: Secondary | ICD-10-CM

## 2014-08-18 DIAGNOSIS — Z85828 Personal history of other malignant neoplasm of skin: Secondary | ICD-10-CM

## 2014-08-18 DIAGNOSIS — Z6841 Body Mass Index (BMI) 40.0 and over, adult: Secondary | ICD-10-CM

## 2014-08-18 DIAGNOSIS — L659 Nonscarring hair loss, unspecified: Secondary | ICD-10-CM

## 2014-08-18 DIAGNOSIS — D500 Iron deficiency anemia secondary to blood loss (chronic): Secondary | ICD-10-CM

## 2014-08-18 DIAGNOSIS — C541 Malignant neoplasm of endometrium: Secondary | ICD-10-CM

## 2014-08-18 LAB — CBC WITH DIFFERENTIAL/PLATELET
BASO%: 1.1 % (ref 0.0–2.0)
Basophils Absolute: 0.1 10*3/uL (ref 0.0–0.1)
EOS%: 11.7 % — AB (ref 0.0–7.0)
Eosinophils Absolute: 0.7 10*3/uL — ABNORMAL HIGH (ref 0.0–0.5)
HCT: 36.2 % (ref 34.8–46.6)
HGB: 11.3 g/dL — ABNORMAL LOW (ref 11.6–15.9)
LYMPH#: 1.2 10*3/uL (ref 0.9–3.3)
LYMPH%: 19.3 % (ref 14.0–49.7)
MCH: 24.5 pg — AB (ref 25.1–34.0)
MCHC: 31.2 g/dL — ABNORMAL LOW (ref 31.5–36.0)
MCV: 78.7 fL — AB (ref 79.5–101.0)
MONO#: 0.4 10*3/uL (ref 0.1–0.9)
MONO%: 6.8 % (ref 0.0–14.0)
NEUT#: 3.8 10*3/uL (ref 1.5–6.5)
NEUT%: 61.1 % (ref 38.4–76.8)
Platelets: 347 10*3/uL (ref 145–400)
RBC: 4.6 10*6/uL (ref 3.70–5.45)
RDW: 15.2 % — AB (ref 11.2–14.5)
WBC: 6.3 10*3/uL (ref 3.9–10.3)

## 2014-08-18 LAB — COMPREHENSIVE METABOLIC PANEL (CC13)
ALBUMIN: 3.7 g/dL (ref 3.5–5.0)
ALT: 19 U/L (ref 0–55)
ANION GAP: 9 meq/L (ref 3–11)
AST: 20 U/L (ref 5–34)
Alkaline Phosphatase: 64 U/L (ref 40–150)
BUN: 19.9 mg/dL (ref 7.0–26.0)
CO2: 24 mEq/L (ref 22–29)
Calcium: 9.4 mg/dL (ref 8.4–10.4)
Chloride: 106 mEq/L (ref 98–109)
Creatinine: 0.8 mg/dL (ref 0.6–1.1)
EGFR: 81 mL/min/{1.73_m2} — AB (ref >90)
GLUCOSE: 89 mg/dL (ref 70–140)
POTASSIUM: 4.6 meq/L (ref 3.5–5.1)
SODIUM: 139 meq/L (ref 136–145)
Total Bilirubin: 0.92 mg/dL (ref 0.20–1.20)
Total Protein: 7.4 g/dL (ref 6.4–8.3)

## 2014-08-18 LAB — IRON AND TIBC CHCC
%SAT: 6 % — ABNORMAL LOW (ref 21–57)
IRON: 24 ug/dL — AB (ref 41–142)
TIBC: 414 ug/dL (ref 236–444)
UIBC: 390 ug/dL — AB (ref 120–384)

## 2014-08-18 LAB — FERRITIN CHCC: Ferritin: 19 ng/ml (ref 9–269)

## 2014-08-18 MED ORDER — LORAZEPAM 1 MG PO TABS: ORAL_TABLET | ORAL | Status: DC

## 2014-08-18 MED ORDER — DEXAMETHASONE 4 MG PO TABS: ORAL_TABLET | ORAL | Status: DC

## 2014-08-18 MED ORDER — ONDANSETRON 8 MG PO TBDP: 8.0000 mg | ORAL_TABLET | Freq: Three times a day (TID) | ORAL | Status: DC | PRN

## 2014-08-18 MED ORDER — LIDOCAINE-PRILOCAINE 2.5-2.5 % EX CREA: TOPICAL_CREAM | CUTANEOUS | Status: AC

## 2014-08-18 NOTE — Progress Notes (Signed)
[place] NEW PATIENT EVALUATION   Name: Erin Santiago Date: August 18, 2014  MRN: [id number] DOB: May 01, 1956  REFERRING PHYSICIAN: Everitt Amber cc Tommy Medal, MD (PCP); [name]; [name], [name]    REASON FOR REFERRAL: recent diagnosis of high grade endometrial stromal sarcoma, for consideration of adjuvant treatment   HISTORY OF PRESENT ILLNESS:Erin Santiago is a 59 y.o. female who is seen in consultation, alone for visit, at the request of  [name]. Information is from this EMR,  MD review of [facility] records, and history now from patient.  Patient had been menopausal since age 58 until she had what seemed to her to be a menstrual period in Sept 2015, with large blood clot at completion of bleeding stopped then. She continued with lesser bleeding over next several months until she was seen in 06-2014 by [name]. Hemoglobin was 12.7 on 07-04-14. CT CAP 06-25-14 had uterus 16.4 x 13.5 x 14.4 cm with marked expansion of endometrial canal by heterogeneous mass, bilateral pelvic sidewall adenopathy, iliac adenopathy with no definite retroperitoneal or mesenteric adenopathy, no ascites, no liver mets.  Attempted endometrial sampling 06-26-14 and 07-03-14 was nondiagnostic; around that time the patient was passing clots and using one large maxipad hourly. She was seen by [name] 07-05-14, with uterine fundus palpable above umbilicus. She had surgery by [name] at [facility] on 07-14-14, which was exploratory laparotomy with TAH BSO, bilateral total pelvic lymphadenectomy and sampling of aortic and renal nodes. Pathology [facility] [phone]) found high grade endometrial stromal sarcoma with primary 18 cm, 8/45 nodes involved including 2 right pelvic and 6 left pelvic nodes; IHC for ER PR was to be done at [facility]. Surgery was uncomplicated and she was DC home on ~ POD #3 with lovenox x 28 days.  Case was presented at [facility] multidisciplinary conference 07-23-14, with recommendation for  PET CT to evaluate inguinal and portahepatis nodes, and to consider adjuvant gemzar taxotere and possibly follow with 4 cycles of adriamycin, then to consider whole pelvic RT. She saw [name] for post op follow up on 07-28-14, with recommendation for gemzar taxotere and adriamycin, whole pelvic RT after chemo and consideration of Megace maintenance after chemo.  PET 08-01-14 had some uptake in aortocaval and left paraaortic regions. Patient attended chemotherapy education class prior to my visit today.  Patient has been progressively recovering since surgery. She had constipation after surgery, better now with increased fiber in diet including good BM this morning. Appetite is good, no nausea or vomiting now. She denies fever or any bladder symptoms. Incision is healing well and she denies significant abdominal or pelvic pain. 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 She discontinued lovenox herself after ~ 2 weeks, no bleeding, no LE swelling or cords and no other symptoms of concern for blood clots; I discussed rationale for full course of post op lovenox with her.   REVIEW OF SYSTEMS as above, also: No increased SOB, no chest pain. IV access has already been difficult. Arthritis in both knees, with increased pain on right in past several days, no known trauma. Weight down 1 lb with surgery. Stopped caffeine pills after surgery, no frank caffeine withdrawal HA. No other HA. Post lasik surgery, no glasses now. No dental concerns tho does not have dentist. Thyroid has been "low normal". No other respiratory symptoms. No palpitations. Last mammograms were at Dr Elza Rafter previous practice ~ 8 years  ago, no noted changes in breasts. Occasional GERD related to diet. No peripheral neuropathy  Remainder of full 10 point review of systems negative.   ALLERGIES: Review of patient's allergies indicates no known allergies.  PAST MEDICAL/ SURGICAL HISTORY:     G0 Morbid obesity Lasik Gastric banding at [facility] 2012, weight loss 50 lbs afterwards Bilateral knee arthritis ETT prior to gastric bypass Hair transplant surgery No flu vaccine Multiple teeth pulled in childhood, has not gone regularly to dentist recently Skin cancer upper back removed, yearly exams by dermatology  CURRENT MEDICATIONS: reviewed as listed now in EMR. Will send scripts for zofran 8 mg q 8 hr prn, ativan 1 mg q 6 hr prn, decadron 8 mg bid with food x 3 days beginning day prior to taxotere, EMLA. She has ferrous sulfate at home tho has not been taking this recently, will try on empty stomach with OJ.   PHARMACY [facility]   SOCIAL HISTORY:  Originally from [place], [place] 2004. Lives alone in [place] with 40 sheep and 3 llamas. Never smoker tho second hand exposure in childhood. 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  FAMILY HISTORY:  Mother NHL, DM, HTN, died age 31 with decubitus Father smoker and ETOH, first MI age 46, hyperthyroid, died 65 of cardiac disease Sister healthy. One brother may be high functioning autistic (builds satellites) One brother ETOH and pacemaker          PHYSICAL EXAM:  height is 5' (1.524 m) and weight is 227 lb 14.4 oz (103.375 kg). Her oral temperature is 98.2 F (36.8 C). Her blood pressure is 132/63 and her pulse is 75. Her respiration is 19 and oxygen saturation is 100%.  Alert, pleasant, cooperative lady, looks stated age, obese,ambulating with some difficulty due to right knee. Good historian. Wears wig. HEENT:partial alopecia chronic. PERRL, not icteric. Oral mucosa moist and clear, no obvious acute dental problems, posterior pharynx clear. Neck supple without JVD or thyroid mass.  RESPIRATORY:lungs clear to  A and P  CARDIAC/ VASCULAR:heart RRR, no gallop, no murmur appreciated. Peripheral pulses symmetrical and intact.  ABDOMEN:obese, soft, not tender, not obviously distended. Cannot appreciate mass or organomegaly. Bowels sounds present and normally active. Surgical incision entirely closed, no tenderness or erythema.  LYMPH NODES:no cervical, supraclavicular, axillary or appreciable inguinal adenopathy   BREASTS:bilaterally without dominant mass, skin or nipple findings of concern.   NEUROLOGIC: CN, motor, sensory, cerebellar all nonfocal. PSYCH appropriate mood and affect  SKIN: without ecchymoses, rash, petechiae. Peripheral veins difficult to visualize. 2 cm well healed scar right upper back from excision of skin cancer, no evidence of local recurrence.  MUSCULOSKELETAL: LE without edema, cords, tenderness. No clubbing. Muscle mass symmetrical. Back not tender. Right knee without heat or erythema    LABORATORY DATA:  Results for orders placed or performed in visit on 08/18/14 (from the past 48 hour(s))  CBC with Differential     Status: Abnormal   Collection Time: 08/18/14 10:41 AM  Result Value Ref Range   WBC 6.3 3.9 - 10.3 10e3/uL   NEUT# 3.8 1.5 - 6.5 10e3/uL   HGB 11.3 (L) 11.6 - 15.9 g/dL   HCT 36.2 34.8 - 46.6 %   Platelets 347 145 - 400 10e3/uL   MCV 78.7 (L)  79.5 - 101.0 fL   MCH 24.5 (L) 25.1 - 34.0 pg   MCHC 31.2 (L) 31.5 - 36.0 g/dL   RBC 4.60 3.70 - 5.45 10e6/uL   RDW 15.2 (H) 11.2 - 14.5 %   lymph# 1.2 0.9 - 3.3 10e3/uL   MONO# 0.4 0.1 - 0.9 10e3/uL   Eosinophils Absolute 0.7 (H) 0.0 - 0.5 10e3/uL   Basophils Absolute 0.1 0.0 - 0.1 10e3/uL   NEUT% 61.1 38.4 - 76.8 %   LYMPH% 19.3 14.0 - 49.7 %   MONO% 6.8 0.0 - 14.0 %   EOS% 11.7 (H) 0.0 - 7.0 %   BASO% 1.1 0.0 - 2.0 %  Comprehensive metabolic panel (Cmet) - [facility]     Status: Abnormal   Collection Time: 08/18/14 10:41 AM  Result Value Ref Range   Sodium 139 136 - 145 mEq/L   Potassium 4.6 3.5 - 5.1 mEq/L    Chloride 106 98 - 109 mEq/L   CO2 24 22 - 29 mEq/L   Glucose 89 70 - 140 mg/dl   BUN 19.9 7.0 - 26.0 mg/dL   Creatinine 0.8 0.6 - 1.1 mg/dL   Total Bilirubin 0.92 0.20 - 1.20 mg/dL   Alkaline Phosphatase 64 40 - 150 U/L   AST 20 5 - 34 U/L   ALT 19 0 - 55 U/L   Total Protein 7.4 6.4 - 8.3 g/dL   Albumin 3.7 3.5 - 5.0 g/dL   Calcium 9.4 8.4 - 10.4 mg/dL   Anion Gap 9 3 - 11 mEq/L   EGFR 81 (L) >90 ml/min/1.73 m2    Comment: eGFR is calculated using the CKD-EPI Creatinine Equation (2009)  Iron and TIBC [facility]     Status: Abnormal   Collection Time: 08/18/14 10:41 AM  Result Value Ref Range   Iron 24 (L) 41 - 142 ug/dL   TIBC 414 236 - 444 ug/dL   UIBC 390 (H) 120 - 384 ug/dL   %SAT 6 (L) 21 - 57 %  Ferritin     Status: None   Collection Time: 08/18/14 10:41 AM  Result Value Ref Range   Ferritin 19 9 - 269 ng/ml      PATHOLOGY:  Surgical pathology exam2/01/2015  [place]  Specimen  Tissue   Result Narrative  Patient:     TALEA, MANGES MRN:         [id number] DOB (Age):   Aug 27, 1955 (Age: 1) Collected:   07/14/2014  Surgical Pathology Report   Addenda Present  Accession #:   [id number]  Diagnosis: A: Uterus, bilateral tubes and ovaries, hysterectomy and bilateral salpingo-oophorectomy  Histologic type:   Endometrial stromal sarcoma with focal smooth muscle differentiation and focal myxoid       change             Histologic grade:   High grade  Tumor site:   Posterior myometrium  Tumor size (gross):   18 cm in greatest dimension        Serosal involvement:   Not involved, tumor is 1 mm from the serosal surface                  Cervical involvement:   Not involved            Other involved sites:   No other sites are involved            Cervical margin and distance:   Widely negative, at least 3.2 cm  Lymphovascular space invasion:   Present   Regional lymph nodes (from parts C-E):      Total number involved:   8                 Total number examined:   45  Additional pathologic findings:      - Endometrium:   Inactive appearing endometrium with cystic atrophy - Right ovary:   Epithelial inclusion cysts with associated calcifications and physiologic changes - Left ovary:   Epithelial inclusion cysts with associated calcifications and physiologic changes  - Right fallopian tube:   Unremarkable fallopian tube with paratubal endosalpingiosis and Walthard nests (see       comment) - Left fallopian tube:   No specific pathologic abnormality                            AJCC Pathologic Stage:    pT1b  pN1 FIGO (2009 classification) Stage Grouping:    IIIC Note:  This pathologic stage assessment is based on information available at the time of this report, and is subject to change pending clinical review and additional information. Best block for future studies:     A12  B: Cervix, trachelectomy  -  Chronic cervicitis with squamous metaplasia and reactive epithelial changes  -  No malignancy identified  C: Lymph node, right pelvic, removal  -  Two of thirteen lymph nodes positive for metastatic sarcoma (2/13)  D: Lymph node, left pelvic, removal  -  Six of twenty four lymph nodes positive for metastatic sarcoma (6/24)  E: Lymph node, periaortic, removal  -  Eight lymph nodes negative for metastatic sarcoma (0/8)  F: Omentum, biopsy  -  Omentum with no malignancy identified   Comment: Histologic examination reveals a large tumor arising in the posterior myometrium and composed of cells with diffuse moderate to severe atypia.  Tumor cells are arranged in short fascicles, in storiform pattern, and in solid sheets.  Necrosis is prominent and up to 26 mitotic figures/10 HPF are identified.  Focal smooth muscle differentiation and myxoid change are identified morphologically (immunostains were  not performed on the block with morphologic smooth muscle differentiation).  Immunohistochemical stains were performed  on block A12 in an attempt to further characterize the sarcoma.  Tumor cells are strongly and diffusely positive for CD10, scattered cells are positive for smooth muscle actin, and scattered cells are positive for desmin.  Tumor cells are negative for myogenin and cytokeratin AE1/AE3.  Morphology and immunostaining pattern are consistent with a high grade endometrial stromal sarcoma.  A cluster of benign glands, some with tubal epithelium, surrounded by dense stroma are seen in the right paratubal tissue.  Immunostain for CD10 is negative in the stroma and glands are thought to represent endosalpingiosis.   Intraoperative Consult Diagnosis: A frozen section consultation was requested by Dr. Marlaine Santiago in OR #30 at 3:48 pm. Specimen delivery time is 3:53 pm.   FSA1: Uterus, representative sections of mass  - Sarcoma, no epithelial component identified on representative section  - Dr. Rayburn Go concurs  Drs. Nell Range on 07/14/2014 at 4:10 pm   Frozen Section Pathologist: Regis Bill, MD  Clinical History: 59 year old female with uterine mass and pelvic adenopathy, C/F, and concerning for endometrial cancer.   Gross Description: Received are six appropriately labeled containers.  Container A is additionally labeled "uterus, bilateral tubes and ovaries."   Adnexa:      present and attached  Weight:      1780 grams  Shape:      markedly distorted, bulbous  Dimensions:           height: 15.5 cm       anterior to posterior width: 12.0 cm       breadth at fundus: 14 cm  Serosa:     tan/brown, markedly distended, with delicate fibrous adhesions  Cervix:      Submitted as Specimen B.             Endomyometrium:           length of endometrial cavity: 7 cm       width of endometrial cavity at fundus: 5 cm   Tumor findings:                                         dimensions: 18 x 15 x 13 cm                 appearance: Cut section reveals a heterogeneous mass  consisting of approximately 75% necrosis, multifocal calcifications,            hemorrhage, and tan/pink peripheral tissue, which is relatively well circumscribed with smooth/lobulated borders.            location and extent:      posterior myometrium, grossly confined by the serosa   Adnexa:           Right ovary:                dimensions: 2.7 x 2.0 x 1.8 cm            external surface: tan/yellow and cerebriform            cut surface:      tan/pink and homogeneous       Right fallopian tube:                dimensions: 3.5 cm in length, 0.5 cm in diameter            other findings: The lumen is patent, the fimbria are unremarkable. There are multiple clear, fluid-filled paratubal cysts            measuring up to 0.2 cm in greatest dimension.        Left ovary:                dimensions: 3 x 2 x 1.3 cm            external surface: tan/brown, smooth, and cerebriform            cut surface:      tan/yellow and homogeneous       Left fallopian tube:                dimensions: 5.5 cm in length, 0.7 cm in diameter                 other findings: The lumen is patent, the fimbria are unremarkable.   Lymph nodes:     submitted as separate specimens      Addendum The following addendum is issued to report the results of estrogen and progesterone receptor immunohistochemical studies.  Results:  Estrogen receptor (Ventana, clone SP1):  Negative, 0%  Progesterone receptor (Ventana, clone 1E2):  Negative, 0%         RADIOGRAPHY:  EXAM: CT ABDOMEN AND PELVIS WITH CONTRAST   06-25-14  COMPARISON: None  FINDINGS: The endometrial canal is markedly expanded with an expansile ill-defined apparent endometrial based mass which is complex, partially cystic with peripheral enhancing nodular tissue and central calcifications. The uterus is enlarged measuring approximately 16.4 x 13.5 x 14.4 cm (as measured in greatest oblique sagittal - image 92, series 41 and axial- image 72,  series 2) dimensions respectively, though a definitive uterine mass is not identified. No discrete adnexal lesion. No free fluid in the pelvic cul-de-sac. Normal appearance of the urinary bladder given degree distention.  Bilateral pelvic sidewall adenopathy with index right iliac chain lymph node measuring 1 cm in greatest short axis diameter (image 78, series 2 an index left iliac chain lymph node measuring 1 cm (image 77).  Shotty bilateral inguinal lymph nodes are individually not enlarged by size criteria and presumably reactive secondary to patient body habitus.  No definite retroperitoneal or mesenteric adenopathy with scattered shot porta hepatis lymph nodes are presumably reactive in etiology with index porta hepatis node measuring 1.3 cm in greatest short axis diameter (image 35, series 2). Scattered shotty retroperitoneal lymph nodes are individually not enlarged by size criteria with index left-sided periaortic lymph node measuring 0.7 cm (image 52, series 2).  ---------------------------------------------------------  Normal hepatic contour. There is a minimal amount of focal fatty infiltration adjacent to the fissure for ligamentum teres. No discrete hepatic lesions. Normal appearance of the gallbladder given degree of distention. No intra or extrahepatic biliary duct dilatation. No ascites.  There is symmetric enhancement and excretion of the bilateral kidneys. No definite renal stones on this postcontrast examination. No discrete renal lesions. No urinary obstruction or perinephric stranding. There is a very minimal amount of grossly symmetric bilateral perinephric stranding, likely body habitus related. Normal appearance of the bilateral adrenal glands, pancreas and spleen.  Post surgical repair of the gastric fundus compatible with provided history of gastric sleeve surgery. Ingestion enteric contrast extends to the level of the splenic flexure of the  colon. Scattered minimal colonic diverticulosis without evidence of diverticulitis. The bowel is otherwise normal in course and caliber without wall thickening or evidence of obstruction. Normal appearance of the appendix. No pneumoperitoneum, pneumatosis or portal venous gas.  Scattered minimal atherosclerotic plaque within a mildly tortuous but normal caliber abdominal aorta. The major branch vessels of the abdominal aorta appear widely patent on this non CTA examination.  Limited visualization of the lower thorax demonstrates minimal ground-glass atelectasis within the imaged bilateral lung bases. No focal airspace opacities. No pleural effusion.  Note is made of a punctate (approximately 3 mm) noncalcified nodule within in the caudal aspect of the left upper lobe (image 1, series 3).  Normal heart size. Calcifications within the mitral valve annulus. No pericardial effusion.  No acute or aggressive osseus abnormalities. Mild to moderate multilevel lumbar spine DDD, worse at L2-L3 with disc space height loss, endplate irregularity and sclerosis.  A solitary shotty subcutaneous presumed lymph node is noted about the left flank measuring 0.6 cm in diameter (image 40, series 2), presumably reactive in etiology. Regional soft tissues appear otherwise normal.  IMPRESSION: 1. Marked expansion of the endometrial canal by a complex, partially cystic, partially solid apparently endometrial based mass - while potentially representative of a solitary submucosal fibroid or retained blood products as a sequela of chronic cervical stenosis, the overall appearance is worrisome for endometrial carcinoma. Further evaluation with endometrial biopsy and/or pelvic MRI is recommended. 2. Bilateral pelvic sidewall  adenopathy, while potentially reactive due to patient body habitus, metastatic disease is not excluded on the basis of this examination. Otherwise, no evidence of  metastatic disease to the abdomen or pelvis. 3. Indeterminate punctate (approximately 3 mm) nodule within in the left upper lobe. If the patient is at high risk for bronchogenic carcinoma, follow-up chest CT at 1 year is recommended.       NUCLEAR MEDICINE PET SKULL BASE TO THIGH   08-01-2014  COMPARISON: CT abdomen and pelvis dated 06/25/2014.  FINDINGS: NECK  No hypermetabolic lymph nodes in the neck. 1.6 cm right thyroid nodule shows no hypermetabolism on PET imaging.  CHEST  No hypermetabolic mediastinal or hilar nodes. No suspicious pulmonary nodules on the CT scan. There is a 3 mm right upper lobe pulmonary nodule seen on image 67 of series 4. Attention to this region on followup imaging is recommended.  ABDOMEN/PELVIS  9 x 12 mm aortocaval lymph node (image 141 series 4) is hypermetabolic with SUV max = 4.6. 7 x 12 mm left para-aortic lymph node seen on image 140 series 4 is also hypermetabolic with SUV max = 2.8. There is some ill-defined, somewhat diffuse FDG accumulation along both pelvic sidewalls, in the region of previous surgery. This probably represents granulation from the lymphadenectomy, but close attention on followup imaging is recommended.  6.3 x 3.8 cm water density fluid collection along the left pelvic sidewall shows no hypermetabolism and is compatible with postoperative seroma.  Linear uptake along the incision site compatible with granulation/healing of surgical scar.  There is a tiny amount of free fluid in the cul-de-sac.  SKELETON  No focal hypermetabolic activity to suggest skeletal metastasis.  IMPRESSION: Hypermetabolic lymph nodes identified in the lower retroperitoneal space of the abdomen compatible with metastatic disease. There is some increased FDG uptake along both pelvic sidewalls which is ill-defined and somewhat diffuse. Given that no underlying lymph nodes are seen in these regions, the pelvic sidewall  uptake is felt to be related to postsurgical granulation and attention on followup imaging is recommended.  3 mm right upper lobe pulmonary nodule likely reflects scarring. Attention on follow-up recommended to ensure stability        DISCUSSION: All of history reviewed to date, including presentation, work up, surgery done and pathology information, and postoperative recovery. I have explained that this is a high grade, aggressive sarcoma which is not the typical type of endometrial cancer, which is at risk to recur locally or distantly despite extent of surgery done. We have discussed rationale for systemic chemotherapy and recommendations for using chemotherapeutic agents which may have more effectiveness in high grade sarcomas than other chemotherapy generally used for nonsarcomatous endometrial malignancies. We have discussed usual treatment schedule for gemzar and taxotere, and mentioned both adriamycin, megace and radiation, tho have not done detailed discussion of those as yet. We have discussed PAC, which is necessary for gemzar, adriamycin and as her peripheral IV access is poor. She is glad to have PAC placed by IR. We have discussed antiemetics and steroids used around taxotere. I have told her that she should not drive herself home from chemotherapy treatments, or to office if she is drowsy from antiemetics or on steroids. We have discussed follow up blood counts and tolerance between treatments. She understands that she can call office at any time if needed between scheduled appointments.  All questions answered. Verbal consent obtained.    IMPRESSION / PLAN:  1.high grade endometrial stromal sarcoma with bilateral pelvic node involvement  at TAH BSO, pelvic lymphadenectomy and other node sampling at [facility] 07-14-14. She is stable for start of chemotherapy after PAC is placed. She will need echocardiogram for LV function prior to adriamycin (likely q 3 weeks x 4 cycles), which will follow  full course of gemzar taxotere (likely 6 cycles if tolerated). Radiation and maintenance megace would also be considerations after completion of chemotherapy. She prefers treatments on Thursdays, to begin ~ 08-28-14 after PAC placement.  2.poor peripheral IV access: power PAC by IR 3.morbid obesity, BMI 44.6 4.post gastric banding with initial weight loss 50 lbs 5.degenerative arthritis knees: right more symptomatic recently and she will call Dr [name] who has seen her previously. OK for injections as long as counts ok 6.overdue dental care tho no acute problems 7.patient stopped lovenox after ~ 2 of planned 4 week post op course. Clinically no evidence of DVT today 8.chronic alopecia such that hair loss with chemo is not concerning to her 9.no flu vaccine 10.history skin ca, apparently nonmelanoma 11,iron deficiency anemia related to gyn bleeding previously: oral iron and follow  Patient had questions answered to her satisfaction and is in agreement with plan above. She can contact this office for questions or concerns at any time prior to next scheduled visit.  Time spent  55 min , including >50% discussion and coordination of care. Chemo and neulasta orders entered. Financial care notified for prior auth.  Cc Drs Denman George, Josefa Half, MD 08/18/2014 2:13 PM

## 2014-08-18 NOTE — Telephone Encounter (Signed)
-----   Message from Gordy Levan, MD sent at 08/18/2014  2:29 PM EDT ----- Pharmacy   [facility]  [employer] fine  Ondansetron 8 mg q 8 hr prn nausea, will not make drowsy  #30 1 RF   Lorazepam 1 mg   1/2 - 1 SL or po q 6 hr prn nausea. Will make you drowsy.  #20  Decadron 4 mg   2 tablets with food bid x 3 days begin day prior to taxotere (second week of chemo)  #12  EMLA to PAC as directed to numb prior to access  #1 tube  1 RF  thanks

## 2014-08-18 NOTE — Progress Notes (Signed)
Checked in new pt with no financial concerns prior to seeing the dr.  Pt has Erin Santiago's card for any billing questions or concerns.  ° °

## 2014-08-20 ENCOUNTER — Other Ambulatory Visit: Payer: Self-pay | Admitting: Oncology

## 2014-08-20 ENCOUNTER — Telehealth: Payer: Self-pay | Admitting: Oncology

## 2014-08-20 ENCOUNTER — Encounter: Payer: Self-pay | Admitting: Oncology

## 2014-08-20 ENCOUNTER — Telehealth: Payer: Self-pay | Admitting: *Deleted

## 2014-08-20 DIAGNOSIS — D500 Iron deficiency anemia secondary to blood loss (chronic): Secondary | ICD-10-CM | POA: Insufficient documentation

## 2014-08-20 DIAGNOSIS — Z6841 Body Mass Index (BMI) 40.0 and over, adult: Secondary | ICD-10-CM

## 2014-08-20 DIAGNOSIS — Z9884 Bariatric surgery status: Secondary | ICD-10-CM | POA: Insufficient documentation

## 2014-08-20 DIAGNOSIS — I878 Other specified disorders of veins: Secondary | ICD-10-CM | POA: Insufficient documentation

## 2014-08-20 DIAGNOSIS — L659 Nonscarring hair loss, unspecified: Secondary | ICD-10-CM | POA: Insufficient documentation

## 2014-08-20 DIAGNOSIS — M170 Bilateral primary osteoarthritis of knee: Secondary | ICD-10-CM | POA: Insufficient documentation

## 2014-08-20 NOTE — Telephone Encounter (Signed)
Spoke with patient regarding next appointment for 08/28/14. per 3/14 pof f/u to be added after chemo appts scheduled. message to LL/desk nurse regarding missing orders for echo - port and care plan. pt aware I will call back with appointments for echo - port.

## 2014-08-20 NOTE — Telephone Encounter (Signed)
Orders entered for echo and port. Echo sent for pre-auth. Spoke with patient today regarding appointment for port placement for 3/22 @ 12:30pm @ [facility]. Pt aware I will call with echo after pre-auth obtaned.

## 2014-08-20 NOTE — Telephone Encounter (Signed)
Spoke with patient and she is aware of her added appointments and will view them on mychart when she gets home

## 2014-08-20 NOTE — Telephone Encounter (Signed)
Per Vaughan Basta no pre-auth needed for echo. Spoke with patient regarding echo for 3/21 @ 10am @ [facility]. Pt has all other appts per 3/14 pof.

## 2014-08-20 NOTE — Telephone Encounter (Signed)
Patient concerned because she can see she has an appointment for chemotherapy on 08/28/14, however she does not have an appointment to have her PAC put in or her "heart test."   Discussed with Erin Santiago scheduler and with Erin Merl RN for Dr. Marko Plume.  Per Erin Santiago:Patient needs actual order for both PAC and echo. Patient also needs Care Plan done for authorization purposes.  Erin Santiago will work on this, however Dr. Marko Plume is out of the office today so it may be Thursday or Friday before she is able to resolve this.  Called patient back and let her know Erin Santiago will be working on this and it may be Thursday or Friday before she gets a call back.  She appreciated the information and will wait to hear from [place].

## 2014-08-25 ENCOUNTER — Other Ambulatory Visit: Payer: Self-pay | Admitting: Radiology

## 2014-08-25 ENCOUNTER — Telehealth: Payer: Self-pay | Admitting: *Deleted

## 2014-08-25 ENCOUNTER — Ambulatory Visit (HOSPITAL_COMMUNITY)
Admission: RE | Admit: 2014-08-25 | Discharge: 2014-08-25 | Disposition: A | Discharge status: Home / Self Care | Payer: Federal, State, Local not specified - PPO | Source: Ambulatory Visit | Attending: Oncology | Admitting: Oncology

## 2014-08-25 DIAGNOSIS — I340 Nonrheumatic mitral (valve) insufficiency: Secondary | ICD-10-CM | POA: Diagnosis not present

## 2014-08-25 DIAGNOSIS — Z90710 Acquired absence of both cervix and uterus: Secondary | ICD-10-CM | POA: Insufficient documentation

## 2014-08-25 DIAGNOSIS — Z01818 Encounter for other preprocedural examination: Secondary | ICD-10-CM | POA: Diagnosis not present

## 2014-08-25 DIAGNOSIS — I517 Cardiomegaly: Secondary | ICD-10-CM | POA: Diagnosis not present

## 2014-08-25 DIAGNOSIS — C541 Malignant neoplasm of endometrium: Secondary | ICD-10-CM

## 2014-08-25 NOTE — Progress Notes (Signed)
  Echocardiogram 2D Echocardiogram has been performed.  Bobbye Charleston 08/25/2014, 9:56 AM

## 2014-08-25 NOTE — Telephone Encounter (Signed)
Catalina Gravel with [employer] Called to request that the patient's chemotherapy be provided by their mail order pharmacy.  Will take this to the Care Management Department.  Ms. Conception Chancy call back number is [phone] X [phone].

## 2014-08-26 ENCOUNTER — Ambulatory Visit (HOSPITAL_COMMUNITY)
Admission: RE | Admit: 2014-08-26 | Discharge: 2014-08-26 | Disposition: A | Discharge status: Home / Self Care | Payer: Federal, State, Local not specified - PPO | Source: Ambulatory Visit | Attending: Oncology | Admitting: Oncology

## 2014-08-26 ENCOUNTER — Encounter (HOSPITAL_COMMUNITY): Payer: Self-pay

## 2014-08-26 ENCOUNTER — Other Ambulatory Visit: Payer: Self-pay | Admitting: Oncology

## 2014-08-26 DIAGNOSIS — C541 Malignant neoplasm of endometrium: Secondary | ICD-10-CM | POA: Insufficient documentation

## 2014-08-26 DIAGNOSIS — Z7952 Long term (current) use of systemic steroids: Secondary | ICD-10-CM | POA: Insufficient documentation

## 2014-08-26 DIAGNOSIS — Z90710 Acquired absence of both cervix and uterus: Secondary | ICD-10-CM | POA: Diagnosis not present

## 2014-08-26 DIAGNOSIS — Z79899 Other long term (current) drug therapy: Secondary | ICD-10-CM | POA: Insufficient documentation

## 2014-08-26 DIAGNOSIS — Z791 Long term (current) use of non-steroidal anti-inflammatories (NSAID): Secondary | ICD-10-CM | POA: Insufficient documentation

## 2014-08-26 DIAGNOSIS — E669 Obesity, unspecified: Secondary | ICD-10-CM | POA: Diagnosis not present

## 2014-08-26 DIAGNOSIS — Z452 Encounter for adjustment and management of vascular access device: Secondary | ICD-10-CM | POA: Insufficient documentation

## 2014-08-26 LAB — BASIC METABOLIC PANEL
Anion gap: 8 (ref 5–15)
BUN: 24 mg/dL — AB (ref 6–23)
CO2: 27 mmol/L (ref 19–32)
CREATININE: 0.63 mg/dL (ref 0.50–1.10)
Calcium: 9.2 mg/dL (ref 8.4–10.5)
Chloride: 105 mmol/L (ref 96–112)
GFR calc Af Amer: >90 mL/min (ref >90)
GLUCOSE: 85 mg/dL (ref 70–99)
POTASSIUM: 4.4 mmol/L (ref 3.5–5.1)
Sodium: 140 mmol/L (ref 135–145)

## 2014-08-26 LAB — PROTIME-INR
INR: 0.95 (ref 0.00–1.49)
Prothrombin Time: 12.8 seconds (ref 11.6–15.2)

## 2014-08-26 LAB — CBC
HCT: 39.1 % (ref 36.0–46.0)
Hemoglobin: 12.1 g/dL (ref 12.0–15.0)
MCH: 24.7 pg — ABNORMAL LOW (ref 26.0–34.0)
MCHC: 30.9 g/dL (ref 30.0–36.0)
MCV: 80 fL (ref 78.0–100.0)
PLATELETS: 398 10*3/uL (ref 150–400)
RBC: 4.89 MIL/uL (ref 3.87–5.11)
RDW: 15.2 % (ref 11.5–15.5)
WBC: 8 10*3/uL (ref 4.0–10.5)

## 2014-08-26 LAB — APTT: aPTT: 27 seconds (ref 24–37)

## 2014-08-26 MED ORDER — HEPARIN SOD (PORK) LOCK FLUSH 100 UNIT/ML IV SOLN
INTRAVENOUS | Status: AC
  Filled 2014-08-26: qty 5

## 2014-08-26 MED ORDER — MIDAZOLAM HCL 2 MG/2ML IJ SOLN
INTRAMUSCULAR | Status: AC | PRN
  Administered 2014-08-26: 1 mg via INTRAVENOUS
  Administered 2014-08-26: 0.5 mg via INTRAVENOUS
  Administered 2014-08-26: 1 mg via INTRAVENOUS

## 2014-08-26 MED ORDER — MIDAZOLAM HCL 2 MG/2ML IJ SOLN
INTRAMUSCULAR | Status: AC
  Filled 2014-08-26: qty 6

## 2014-08-26 MED ORDER — CEFAZOLIN SODIUM-DEXTROSE 2-3 GM-% IV SOLR
2.0000 g | Freq: Once | INTRAVENOUS | Status: AC
  Administered 2014-08-26: 2 g via INTRAVENOUS

## 2014-08-26 MED ORDER — FENTANYL CITRATE 0.05 MG/ML IJ SOLN
INTRAMUSCULAR | Status: AC | PRN
  Administered 2014-08-26 (×2): 50 ug via INTRAVENOUS
  Administered 2014-08-26: 25 ug via INTRAVENOUS

## 2014-08-26 MED ORDER — CEFAZOLIN SODIUM-DEXTROSE 2-3 GM-% IV SOLR
INTRAVENOUS | Status: AC
  Filled 2014-08-26: qty 50

## 2014-08-26 MED ORDER — LIDOCAINE-EPINEPHRINE 2 %-1:100000 IJ SOLN
INTRAMUSCULAR | Status: AC
  Filled 2014-08-26: qty 1

## 2014-08-26 MED ORDER — FENTANYL CITRATE 0.05 MG/ML IJ SOLN
INTRAMUSCULAR | Status: AC
  Filled 2014-08-26: qty 6

## 2014-08-26 MED ORDER — SODIUM CHLORIDE 0.9 % IV SOLN
Freq: Once | INTRAVENOUS | Status: AC
  Administered 2014-08-26: 13:00:00 via INTRAVENOUS

## 2014-08-26 NOTE — Procedures (Signed)
Placement of right jugular port.  Tip in lower SVC and ready to use.  No immediate complication.   Minimal blood loss.

## 2014-08-26 NOTE — H&P (Signed)
Chief Complaint: "I'm having a port a cath put in"  Referring Physician(s): Livesay,Lennis P  History of Present Illness: Erin Santiago is a 59 y.o. female with recent diagnosis of high grade endometrial stromal sarcoma who presents today for Port-A-Cath placement for chemotherapy.  Past Medical History  Diagnosis Date  . Abnormal uterine bleeding   . Anemia   . Fibroid   . Heart murmur     under 5 yrs of age    Past Surgical History  Procedure Laterality Date  . Laparoscopic gastric sleeve resection    . Abdominal hysterectomy  07/2014    at [facility]    Allergies: Review of patient's allergies indicates no known allergies.  Medications: Prior to Admission medications   Medication Sig Start Date End Date Taking? Authorizing Provider  dexamethasone (DECADRON) 4 MG tablet Take 2 tabs with food twice a day x 3 days beginning day prior to Taxotere(Second week of Chemo) 08/18/14   Lennis Marion Downer, MD  docusate sodium (COLACE) 100 MG capsule Take 100 mg by mouth. 07/17/14   Historical Provider, MD  ferrous sulfate 325 (65 FE) MG tablet Take 1 tablet (325 mg total) by mouth daily with breakfast. 07/05/14   Robbie Lis, MD  hydrocortisone 2.5 % cream Apply 1 application topically as needed (itch).  05/19/14   Historical Provider, MD  ibuprofen (ADVIL,MOTRIN) 800 MG tablet Take 800 mg by mouth. 07/17/14   Historical Provider, MD  lidocaine-prilocaine (EMLA) cream Apply to Porta-cath 1-2 hrs prior to access. Cover with [employer]. 08/18/14   Lennis Marion Downer, MD  LORazepam (ATIVAN) 1 MG tablet Place 1/2-1 tablet under the tongue or swallow every 6 hrs as needed for nausea. Will make you drowsy. 08/18/14   Lennis Marion Downer, MD  meloxicam (MOBIC) 15 MG tablet Take 15 mg by mouth daily.  06/09/14   Historical Provider, MD  ondansetron (ZOFRAN ODT) 8 MG disintegrating tablet Take 1 tablet (8 mg total) by mouth every 8 (eight) hours as needed for nausea or vomiting. 08/18/14   Lennis Marion Downer, MD    oxyCODONE-acetaminophen (PERCOCET/ROXICET) 5-325 MG per tablet Take by mouth. 07/17/14   Historical Provider, MD  Psyllium 28.3 % POWD Take 1 packet by mouth daily.    Historical Provider, MD    Family History  Problem Relation Age of Onset  . Hypertension Mother   . Diabetes Mother   . Thyroid disease Father   . Heart attack Father   . Other Father     enlarged heart    History   Social History  . Marital Status: Single    Spouse Name: N/A  . Number of Children: N/A  . Years of Education: N/A   Social History Main Topics  . Smoking status: Never Smoker   . Smokeless tobacco: Not on file  . Alcohol Use: No  . Drug Use: No  . Sexual Activity: No   Other Topics Concern  . None   Social History Narrative      Review of Systems  Constitutional: Negative for fever and chills.  Respiratory: Negative for shortness of breath.        Occ cough  Cardiovascular: Negative for chest pain.  Gastrointestinal: Negative for nausea, vomiting, abdominal pain and blood in stool.  Genitourinary: Negative for dysuria and hematuria.  Musculoskeletal: Negative for back pain.  Neurological: Negative for headaches.  Hematological: Does not bruise/bleed easily.    Vital Signs: LMP 02/04/2014  blood pressure 159/91, temperature 98.1,  respirations 16, oxygen saturation 100% on room air heart rate 66  Physical Exam  Constitutional: She is oriented to person, place, and time. She appears well-developed and well-nourished.  Cardiovascular: Normal rate and regular rhythm.   Pulmonary/Chest: Effort normal and breath sounds normal.  Abdominal: Soft. Bowel sounds are normal.  obese  Musculoskeletal: Normal range of motion.  Neurological: She is alert and oriented to person, place, and time.    Imaging: Nm Pet Image Initial (pi) Skull Base To Thigh  08/01/2014   CLINICAL DATA:  Initial treatment strategy for high grade endometrial stromal sarcoma. 8 of 45 pelvic lymph nodes were positive  for tumor.  EXAM: NUCLEAR MEDICINE PET SKULL BASE TO THIGH  TECHNIQUE: 12.2 mCi F-18 FDG was injected intravenously. Full-ring PET imaging was performed from the skull base to thigh after the radiotracer. CT data was obtained and used for attenuation correction and anatomic localization.  FASTING BLOOD GLUCOSE:  Value: 83 mg/dl  COMPARISON:  CT abdomen and pelvis dated 06/25/2014.  FINDINGS: NECK  No hypermetabolic lymph nodes in the neck. 1.6 cm right thyroid nodule shows no hypermetabolism on PET imaging.  CHEST  No hypermetabolic mediastinal or hilar nodes. No suspicious pulmonary nodules on the CT scan. There is a 3 mm right upper lobe pulmonary nodule seen on image 67 of series 4. Attention to this region on followup imaging is recommended.  ABDOMEN/PELVIS  9 x 12 mm aortocaval lymph node (image 141 series 4) is hypermetabolic with SUV max = 4.6. 7 x 12 mm left para-aortic lymph node seen on image 140 series 4 is also hypermetabolic with SUV max = 2.8. There is some ill-defined, somewhat diffuse FDG accumulation along both pelvic sidewalls, in the region of previous surgery. This probably represents granulation from the lymphadenectomy, but close attention on followup imaging is recommended.  6.3 x 3.8 cm water density fluid collection along the left pelvic sidewall shows no hypermetabolism and is compatible with postoperative seroma.  Linear uptake along the incision site compatible with granulation/healing of surgical scar.  There is a tiny amount of free fluid in the cul-de-sac.  SKELETON  No focal hypermetabolic activity to suggest skeletal metastasis.  IMPRESSION: Hypermetabolic lymph nodes identified in the lower retroperitoneal space of the abdomen compatible with metastatic disease. There is some increased FDG uptake along both pelvic sidewalls which is ill-defined and somewhat diffuse. Given that no underlying lymph nodes are seen in these regions, the pelvic sidewall uptake is felt to be related to  postsurgical granulation and attention on followup imaging is recommended.  3 mm right upper lobe pulmonary nodule likely reflects scarring. Attention on follow-up recommended to ensure stability.   Electronically Signed   By: Misty Stanley M.D.   On: 08/01/2014 10:17    Labs:  CBC:  Recent Labs  07/04/14 1833 07/05/14 0551 08/18/14 1041 08/26/14 1300  WBC 12.1* 8.8 6.3 8.0  HGB 11.8* 10.6* 11.3* 12.1  HCT 36.9 33.6* 36.2 39.1  PLT 312 288 347 398    COAGS:  Recent Labs  07/04/14 2220  INR 1.07  APTT 31    BMP:  Recent Labs  07/04/14 1833 07/05/14 0551 08/18/14 1041 08/26/14 1300  NA 140 141 139 140  K 3.7 4.1 4.6 4.4  CL 107 108  --  105  CO2 26 26 24 27   GLUCOSE 94 101* 89 85  BUN 18 16 19.9 24*  CALCIUM 9.0 8.5 9.4 9.2  CREATININE 0.77 0.79 0.8 0.63  GFRNONAA >90 89*  --  >  90  GFRAA >90 >90  --  >90    LIVER FUNCTION TESTS:  Recent Labs  08/18/14 1041  BILITOT 0.92  AST 20  ALT 19  ALKPHOS 64  PROT 7.4  ALBUMIN 3.7    TUMOR MARKERS: No results for input(s): AFPTM, CEA, CA199, CHROMGRNA in the last 8760 hours.  Assessment and Plan: Zi Sek is a 59 y.o. female with recent diagnosis of high grade endometrial stromal sarcoma who presents today for Port-A-Cath placement for chemotherapy. Risks and benefits discussed with the patient including, but not limited to bleeding, infection, pneumothorax, or fibrin sheath development and need for additional procedures. All of the patient's questions were answered, patient is agreeable to proceed. Consent signed and in chart.   Signed: Autumn Messing 08/26/2014, 2:20 PM  I spent a total of 20 minutes face to face in clinical consultation, greater than 50% of which was counseling/coordinating care for Port-A-Cath placement

## 2014-08-26 NOTE — Discharge Instructions (Signed)
Leave dressing on for 24 hours.  You may shower after 24 hours.  Please remove the dressing before you shower.    ° °Conscious Sedation, Adult, Care After °Refer to this sheet in the next few weeks. These instructions provide you with information on caring for yourself after your procedure. Your health care provider may also give you more specific instructions. Your treatment has been planned according to current medical practices, but problems sometimes occur. Call your health care provider if you have any problems or questions after your procedure. °WHAT TO EXPECT AFTER THE PROCEDURE  °After your procedure: °· You may feel sleepy, clumsy, and have poor balance for several hours. °· Vomiting may occur if you eat too soon after the procedure. °HOME CARE INSTRUCTIONS °· Do not participate in any activities where you could become injured for at least 24 hours. Do not: °¨ Drive. °¨ Swim. °¨ Ride a bicycle. °¨ Operate heavy machinery. °¨ Blick. °¨ Use power tools. °¨ Climb ladders. °¨ Work from a high place. °· Do not make important decisions or sign legal documents until you are improved. °· If you vomit, drink water, juice, or soup when you can drink without vomiting. Make sure you have little or no nausea before eating solid foods. °· Only take over-the-counter or prescription medicines for pain, discomfort, or fever as directed by your health care provider. °· Make sure you and your family fully understand everything about the medicines given to you, including what side effects may occur. °· You should not drink alcohol, take sleeping pills, or take medicines that cause drowsiness for at least 24 hours. °· If you smoke, do not smoke without supervision. °· If you are feeling better, you may resume normal activities 24 hours after you were sedated. °· Keep all appointments with your health care provider. °SEEK MEDICAL CARE IF: °· Your skin is pale or bluish in color. °· You continue to feel nauseous or vomit. °· Your  pain is getting worse and is not helped by medicine. °· You have bleeding or swelling. °· You are still sleepy or feeling clumsy after 24 hours. °SEEK IMMEDIATE MEDICAL CARE IF: °· You develop a rash. °· You have difficulty breathing. °· You develop any type of allergic problem. °· You have a fever. °MAKE SURE YOU: °· Understand these instructions. °· Will watch your condition. °· Will get help right away if you are not doing well or get worse. °Document Released: 03/13/2013 Document Reviewed: 03/13/2013 °ExitCare® Patient Information ©2015 [employer]. This information is not intended to replace advice given to you by your health care provider. Make sure you discuss any questions you have with your health care provider. °Implanted Port Insertion, Care After °Refer to this sheet in the next few weeks. These instructions provide you with information on caring for yourself after your procedure. Your health care provider may also give you more specific instructions. Your treatment has been planned according to current medical practices, but problems sometimes occur. Call your health care provider if you have any problems or questions after your procedure. °WHAT TO EXPECT AFTER THE PROCEDURE °After your procedure, it is typical to have the following:  °· Discomfort at the port insertion site. Ice packs to the area will help. °· Bruising on the skin over the port. This will subside in 3-4 days. °HOME CARE INSTRUCTIONS °· After your port is placed, you will get a manufacturer's information card. The card has information about your port. Keep this card with you   at all times.   °· Know what kind of port you have. There are many types of ports available.   °· Wear a medical alert bracelet in case of an emergency. This can help alert health care workers that you have a port.   °· The port can stay in for as long as your health care provider believes it is necessary.   °· A home health care nurse may give medicines and take  care of the port.   °· You or a family member can get special training and directions for giving medicine and taking care of the port at home.   °SEEK MEDICAL CARE IF:  °· Your port does not flush or you are unable to get a blood return.   °· You have a fever or chills. °SEEK IMMEDIATE MEDICAL CARE IF: °· You have new fluid or pus coming from your incision.   °· You notice a bad smell coming from your incision site.   °· You have swelling, pain, or more redness at the incision or port site.   °· You have chest pain or shortness of breath. °Document Released: 03/13/2013 Document Revised: 05/28/2013 Document Reviewed: 03/13/2013 °ExitCare® Patient Information ©2015 [employer]. This information is not intended to replace advice given to you by your health care provider. Make sure you discuss any questions you have with your health care provider. ° °

## 2014-08-26 NOTE — Sedation Documentation (Signed)
Heparin flush

## 2014-08-27 ENCOUNTER — Other Ambulatory Visit: Payer: Self-pay | Admitting: *Deleted

## 2014-08-27 DIAGNOSIS — C541 Malignant neoplasm of endometrium: Secondary | ICD-10-CM

## 2014-08-28 ENCOUNTER — Ambulatory Visit (HOSPITAL_BASED_OUTPATIENT_CLINIC_OR_DEPARTMENT_OTHER): Payer: Federal, State, Local not specified - PPO

## 2014-08-28 ENCOUNTER — Other Ambulatory Visit (HOSPITAL_BASED_OUTPATIENT_CLINIC_OR_DEPARTMENT_OTHER): Payer: Federal, State, Local not specified - PPO

## 2014-08-28 DIAGNOSIS — C541 Malignant neoplasm of endometrium: Secondary | ICD-10-CM | POA: Diagnosis not present

## 2014-08-28 DIAGNOSIS — Z5111 Encounter for antineoplastic chemotherapy: Secondary | ICD-10-CM

## 2014-08-28 LAB — CBC WITH DIFFERENTIAL/PLATELET
BASO%: 1 % (ref 0.0–2.0)
Basophils Absolute: 0.1 10*3/uL (ref 0.0–0.1)
EOS%: 6.1 % (ref 0.0–7.0)
Eosinophils Absolute: 0.5 10*3/uL (ref 0.0–0.5)
HEMATOCRIT: 38.2 % (ref 34.8–46.6)
HGB: 11.8 g/dL (ref 11.6–15.9)
LYMPH%: 18.7 % (ref 14.0–49.7)
MCH: 24.3 pg — ABNORMAL LOW (ref 25.1–34.0)
MCHC: 30.9 g/dL — AB (ref 31.5–36.0)
MCV: 78.8 fL — AB (ref 79.5–101.0)
MONO#: 0.5 10*3/uL (ref 0.1–0.9)
MONO%: 6.1 % (ref 0.0–14.0)
NEUT#: 5.2 10*3/uL (ref 1.5–6.5)
NEUT%: 68.1 % (ref 38.4–76.8)
PLATELETS: 351 10*3/uL (ref 145–400)
RBC: 4.85 10*6/uL (ref 3.70–5.45)
RDW: 16.6 % — ABNORMAL HIGH (ref 11.2–14.5)
WBC: 7.6 10*3/uL (ref 3.9–10.3)
lymph#: 1.4 10*3/uL (ref 0.9–3.3)

## 2014-08-28 LAB — COMPREHENSIVE METABOLIC PANEL (CC13)
ALK PHOS: 65 U/L (ref 40–150)
ALT: 12 U/L (ref 0–55)
AST: 14 U/L (ref 5–34)
Albumin: 3.7 g/dL (ref 3.5–5.0)
Anion Gap: 10 mEq/L (ref 3–11)
BILIRUBIN TOTAL: 0.78 mg/dL (ref 0.20–1.20)
BUN: 19.6 mg/dL (ref 7.0–26.0)
CO2: 25 mEq/L (ref 22–29)
CREATININE: 0.7 mg/dL (ref 0.6–1.1)
Calcium: 9 mg/dL (ref 8.4–10.4)
Chloride: 106 mEq/L (ref 98–109)
EGFR: >90 mL/min/{1.73_m2} (ref >90)
GLUCOSE: 110 mg/dL (ref 70–140)
Potassium: 4.2 mEq/L (ref 3.5–5.1)
SODIUM: 141 meq/L (ref 136–145)
Total Protein: 7.2 g/dL (ref 6.4–8.3)

## 2014-08-28 MED ORDER — HEPARIN SOD (PORK) LOCK FLUSH 100 UNIT/ML IV SOLN
500.0000 [IU] | Freq: Once | INTRAVENOUS | Status: AC | PRN
  Administered 2014-08-28: 500 [IU]
  Filled 2014-08-28: qty 5

## 2014-08-28 MED ORDER — SODIUM CHLORIDE 0.9 % IV SOLN
Freq: Once | INTRAVENOUS | Status: AC
  Administered 2014-08-28: 14:00:00 via INTRAVENOUS

## 2014-08-28 MED ORDER — SODIUM CHLORIDE 0.9 % IV SOLN
900.0000 mg/m2 | Freq: Once | INTRAVENOUS | Status: AC
  Administered 2014-08-28: 1900 mg via INTRAVENOUS
  Filled 2014-08-28: qty 49.97

## 2014-08-28 MED ORDER — SODIUM CHLORIDE 0.9 % IV SOLN
Freq: Once | INTRAVENOUS | Status: AC
  Administered 2014-08-28: 14:00:00 via INTRAVENOUS
  Filled 2014-08-28: qty 4

## 2014-08-28 MED ORDER — SODIUM CHLORIDE 0.9 % IJ SOLN
10.0000 mL | INTRAMUSCULAR | Status: DC | PRN
  Administered 2014-08-28: 10 mL
  Filled 2014-08-28: qty 10

## 2014-08-28 NOTE — Patient Instructions (Signed)
[facility] Discharge Instructions for Patients Receiving Chemotherapy  Today you received the following chemotherapy agents Gemzar  To help prevent nausea and vomiting after your treatment, we encourage you to take your nausea medication as prescribed   If you develop nausea and vomiting that is not controlled by your nausea medication, call the clinic.   BELOW ARE SYMPTOMS THAT SHOULD BE REPORTED IMMEDIATELY:  *FEVER GREATER THAN 100.5 F  *CHILLS WITH OR WITHOUT FEVER  NAUSEA AND VOMITING THAT IS NOT CONTROLLED WITH YOUR NAUSEA MEDICATION  *UNUSUAL SHORTNESS OF BREATH  *UNUSUAL BRUISING OR BLEEDING  TENDERNESS IN MOUTH AND THROAT WITH OR WITHOUT PRESENCE OF ULCERS  *URINARY PROBLEMS  *BOWEL PROBLEMS  UNUSUAL RASH Items with * indicate a potential emergency and should be followed up as soon as possible.  Feel free to call the clinic you have any questions or concerns. The clinic phone number is [phone].  Please show the CHEMO ALERT CARD at check-in to the Emergency Department and triage nurse.   

## 2014-08-31 ENCOUNTER — Other Ambulatory Visit: Payer: Self-pay | Admitting: Oncology

## 2014-09-01 ENCOUNTER — Telehealth: Payer: Self-pay | Admitting: Oncology

## 2014-09-01 ENCOUNTER — Encounter: Payer: Self-pay | Admitting: Oncology

## 2014-09-01 ENCOUNTER — Ambulatory Visit (HOSPITAL_BASED_OUTPATIENT_CLINIC_OR_DEPARTMENT_OTHER): Payer: Federal, State, Local not specified - PPO | Admitting: Oncology

## 2014-09-01 VITALS — BP 118/74 | HR 81 | Temp 98.1°F | Resp 18 | Ht 60.0 in | Wt 230.0 lb

## 2014-09-01 DIAGNOSIS — C541 Malignant neoplasm of endometrium: Secondary | ICD-10-CM

## 2014-09-01 DIAGNOSIS — D500 Iron deficiency anemia secondary to blood loss (chronic): Secondary | ICD-10-CM | POA: Diagnosis not present

## 2014-09-01 DIAGNOSIS — Z95828 Presence of other vascular implants and grafts: Secondary | ICD-10-CM

## 2014-09-01 NOTE — Telephone Encounter (Signed)
Appointments made and avs printed for patient °

## 2014-09-01 NOTE — Progress Notes (Signed)
OFFICE PROGRESS NOTE   September 01, 2014   Physicians:Emma Ellyn Hack, Delfino Lovett, MD (PCP); [name]; [name], [name]  INTERVAL HISTORY:  Patient is seen   ONCOLOGIC HISTORY Patient had been menopausal since age 59 until she had what seemed to her to be a menstrual period in Sept 2015, with large blood clot at completion of bleeding stopped then. She continued with lesser bleeding over next several months until she was seen in 06-2014 by [name]. Hemoglobin was 12.7 on 07-04-14. CT CAP 06-25-14 had uterus 16.4 x 13.5 x 14.4 cm with marked expansion of endometrial canal by heterogeneous mass, bilateral pelvic sidewall adenopathy, iliac adenopathy with no definite retroperitoneal or mesenteric adenopathy, no ascites, no liver mets. Attempted endometrial sampling 06-26-14 and 07-03-14 was nondiagnostic; around that time the patient was passing clots and using one large maxipad hourly. She was seen by [name] 07-05-14, with uterine fundus palpable above umbilicus. She had surgery by [name] at [facility] on 07-14-14, which was exploratory laparotomy with TAH BSO, bilateral total pelvic lymphadenectomy and sampling of aortic and renal nodes. Pathology [facility] [phone]) found high grade endometrial stromal sarcoma with primary 18 cm, 8/45 nodes involved including 2 right pelvic and 6 left pelvic nodes; IHC for ER PR was to be done at [facility]. Surgery was uncomplicated and she was DC home on ~ POD #3 with lovenox x 28 days. Case was presented at [facility] multidisciplinary conference 07-23-14, with recommendation for PET CT to evaluate inguinal and portahepatis nodes, and to consider adjuvant gemzar taxotere and possibly follow with 4 cycles of adriamycin, then to consider whole pelvic RT. She saw [name] for post op follow up on 07-28-14, with recommendation for gemzar taxotere and adriamycin, whole pelvic RT after chemo and consideration of Megace maintenance after chemo. PET 08-01-14 had some uptake  in aortocaval and left paraaortic regions.     Review of systems as above, also:  Remainder of 10 point Review of Systems negative.  Objective:  Vital signs in last 24 hours:  BP 118/74 mmHg  Pulse 81  Temp(Src) 98.1 F (36.7 C) (Oral)  Resp 18  Ht 5' (1.524 m)  Wt 230 lb (104.327 kg)  BMI 44.92 kg/m2  LMP 02/04/2014  Alert, oriented and appropriate. Ambulatory without assistance difficulty.  Alopecia  HEENT:PERRL, sclerae not icteric. Oral mucosa moist without lesions, posterior pharynx clear.  Neck supple. No JVD.  Lymphatics:no cervical,suraclavicular, axillary or inguinal adenopathy Resp: clear to auscultation bilaterally and normal percussion bilaterally Cardio: regular rate and rhythm. No gallop. GI: soft, nontender, not distended, no mass or organomegaly. Normally active bowel sounds. Surgical incision not remarkable. Musculoskeletal/ Extremities: without pitting edema, cords, tenderness Neuro: no peripheral neuropathy. Otherwise nonfocal Skin without rash, ecchymosis, petechiae Breasts: without dominant mass, skin or nipple findings. Axillae benign. Portacath-without erythema or tenderness  Lab Results:  Results for orders placed or performed in visit on 08/28/14  CBC with Differential  Result Value Ref Range   WBC 7.6 3.9 - 10.3 10e3/uL   NEUT# 5.2 1.5 - 6.5 10e3/uL   HGB 11.8 11.6 - 15.9 g/dL   HCT 38.2 34.8 - 46.6 %   Platelets 351 145 - 400 10e3/uL   MCV 78.8 (L) 79.5 - 101.0 fL   MCH 24.3 (L) 25.1 - 34.0 pg   MCHC 30.9 (L) 31.5 - 36.0 g/dL   RBC 4.85 3.70 - 5.45 10e6/uL   RDW 16.6 (H) 11.2 - 14.5 %   lymph# 1.4 0.9 -  3.3 10e3/uL   MONO# 0.5 0.1 - 0.9 10e3/uL   Eosinophils Absolute 0.5 0.0 - 0.5 10e3/uL   Basophils Absolute 0.1 0.0 - 0.1 10e3/uL   NEUT% 68.1 38.4 - 76.8 %   LYMPH% 18.7 14.0 - 49.7 %   MONO% 6.1 0.0 - 14.0 %   EOS% 6.1 0.0 - 7.0 %   BASO% 1.0 0.0 - 2.0 %  Comprehensive metabolic panel (Cmet) - [facility]  Result Value Ref Range    Sodium 141 136 - 145 mEq/L   Potassium 4.2 3.5 - 5.1 mEq/L   Chloride 106 98 - 109 mEq/L   CO2 25 22 - 29 mEq/L   Glucose 110 70 - 140 mg/dl   BUN 19.6 7.0 - 26.0 mg/dL   Creatinine 0.7 0.6 - 1.1 mg/dL   Total Bilirubin 0.78 0.20 - 1.20 mg/dL   Alkaline Phosphatase 65 40 - 150 U/L   AST 14 5 - 34 U/L   ALT 12 0 - 55 U/L   Total Protein 7.2 6.4 - 8.3 g/dL   Albumin 3.7 3.5 - 5.0 g/dL   Calcium 9.0 8.4 - 10.4 mg/dL   Anion Gap 10 3 - 11 mEq/L   EGFR >90 >90 ml/min/1.73 m2     Studies/Results:  No results found.  Medications: I have reviewed the patient's current medications.  DISCUSSION  Assessment/Plan:  1.high grade endometrial stromal sarcoma with bilateral pelvic node involvement at TAH BSO, pelvic lymphadenectomy and other node sampling at [facility] 07-14-14. She is stable for start of chemotherapy after PAC is placed. She will need echocardiogram for LV function prior to adriamycin (likely q 3 weeks x 4 cycles), which will follow full course of gemzar taxotere (likely 6 cycles if tolerated). Radiation and maintenance megace would also be considerations after completion of chemotherapy. She prefers treatments on Thursdays, to begin ~ 08-28-14 after PAC placement.  2.poor peripheral IV access: power PAC by IR 3.morbid obesity, BMI 44.6 4.post gastric banding with initial weight loss 50 lbs 5.degenerative arthritis knees: right more symptomatic recently and she will call Dr [name] who has seen her previously. OK for injections as long as counts ok 6.overdue dental care tho no acute problems 7.patient stopped lovenox after ~ 2 of planned 4 week post op course. Clinically no evidence of DVT today 8.chronic alopecia such that hair loss with chemo is not concerning to her 9.no flu vaccine 10.history skin ca, apparently nonmelanoma 11,iron deficiency anemia related to gyn bleeding previously: oral iron and follow      Erin Santiago P, MD   09/01/2014, 9:42 AM

## 2014-09-02 NOTE — Progress Notes (Signed)
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 She had PAC placed by IR on 08-26-14.  Patient did well with day 1 cycle 1 gemzar, and is for day 8 cycle 1 gemzar + taxotere on 09-04-14.Marland Kitchen Plan is ~ 6 cycles of gemzar taxotere, then ~4 cycles of adriamycin, then consider radiation and maintenance megace after completion of chemotherapy.  She had no fever or aches and no nausea since the gemzar. She was constipated for ~ 2 days, better with increased fiber and stool softeners now.   PAC in    [place] Patient had been menopausal since age 63 until she had what seemed to her to be a menstrual period in Sept 2015, with large blood clot at completion of bleeding stopped then. She continued with lesser bleeding over next several months until she was seen in 06-2014 by [name]. Hemoglobin was 12.7 on 07-04-14. CT CAP 06-25-14 had uterus 16.4 x 13.5 x 14.4 cm with marked expansion of endometrial canal by heterogeneous mass, bilateral pelvic sidewall adenopathy, iliac adenopathy with no definite retroperitoneal or mesenteric adenopathy, no ascites, no liver mets. Attempted endometrial sampling 06-26-14 and 07-03-14 was nondiagnostic; around that time the patient was passing clots and using one large maxipad hourly. She was seen by [name] 07-05-14, with uterine fundus palpable above umbilicus. She had surgery by [name] at [facility] on 07-14-14, which was exploratory laparotomy with TAH BSO, bilateral total pelvic lymphadenectomy and sampling of aortic and renal nodes. Pathology [facility] [phone]) found high grade endometrial stromal sarcoma with primary 18 cm, 8/45 nodes involved including 2 right pelvic and 6 left pelvic nodes; IHC for ER PR was to be  done at [facility]. Surgery was uncomplicated and she was DC home on ~ POD #3 with lovenox x 28 days. Case was presented at [facility] multidisciplinary conference 07-23-14, with recommendation for PET CT to evaluate inguinal and portahepatis nodes, and to consider adjuvant gemzar taxotere and possibly follow with 4 cycles of adriamycin, then to consider whole pelvic RT. She saw [name] for post op follow up on 07-28-14, with recommendation for gemzar taxotere and adriamycin, whole pelvic RT after chemo and consideration of Megace maintenance after chemo. PET 08-01-14 had some uptake in aortocaval and left paraaortic regions. SHe had day 1 cycle 1 gemzar taxotere on 08-28-14.     Review of systems as above, also: Knees much better since fluid aspiration from right and steroid shots bilaterally by Dr [name] just prior to start of chemo. Taste ok. No abdominal or pelvic pain. Bladder ok. No SOB or other respiratory symptoms. Remainder of 10 point Review of Systems negative.  Objective:  Vital signs in last 24 hours:  BP 118/74 mmHg  Pulse 81  Temp(Src) 98.1 F (36.7 C) (Oral)  Resp 18  Ht 5' (1.524 m)  Wt 230 lb (104.327 kg)  BMI 44.92 kg/m2  LMP 02/04/2014 Weight is up 3 lbs. Alert, oriented and appropriate. Ambulatory without difficulty, able to get on and off exam table more easily Partial alopecia, which is chronic.  HEENT:PERRL, sclerae not icteric. Oral mucosa moist without lesions, posterior pharynx clear.  Neck supple. No JVD.  Lymphatics:no cervical,supraclavicular or inguinal adenopathy Resp: clear to auscultation bilaterally and normal percussion bilaterally Cardio: regular rate and rhythm.  No gallop. GI: abdomen obese, soft, nontender, not obviously distended, no appreciable mass or organomegaly. Normally active bowel sounds. Surgical incision closed, not tender Musculoskeletal/ Extremities: without pitting edema, cords, tenderness Neuro: no peripheral neuropathy. Otherwise  nonfocal Skin without rash, other ecchymosis, petechiae Portacath-left anterior chest, good position, resolving ecchymosis from placement, surgical glue in place.  Lab Results:  Results for orders placed or performed in visit on 08/28/14  CBC with Differential  Result Value Ref Range   WBC 7.6 3.9 - 10.3 10e3/uL   NEUT# 5.2 1.5 - 6.5 10e3/uL   HGB 11.8 11.6 - 15.9 g/dL   HCT 38.2 34.8 - 46.6 %   Platelets 351 145 - 400 10e3/uL   MCV 78.8 (L) 79.5 - 101.0 fL   MCH 24.3 (L) 25.1 - 34.0 pg   MCHC 30.9 (L) 31.5 - 36.0 g/dL   RBC 4.85 3.70 - 5.45 10e6/uL   RDW 16.6 (H) 11.2 - 14.5 %   lymph# 1.4 0.9 - 3.3 10e3/uL   MONO# 0.5 0.1 - 0.9 10e3/uL   Eosinophils Absolute 0.5 0.0 - 0.5 10e3/uL   Basophils Absolute 0.1 0.0 - 0.1 10e3/uL   NEUT% 68.1 38.4 - 76.8 %   LYMPH% 18.7 14.0 - 49.7 %   MONO% 6.1 0.0 - 14.0 %   EOS% 6.1 0.0 - 7.0 %   BASO% 1.0 0.0 - 2.0 %  Comprehensive metabolic panel (Cmet) - [facility]  Result Value Ref Range   Sodium 141 136 - 145 mEq/L   Potassium 4.2 3.5 - 5.1 mEq/L   Chloride 106 98 - 109 mEq/L   CO2 25 22 - 29 mEq/L   Glucose 110 70 - 140 mg/dl   BUN 19.6 7.0 - 26.0 mg/dL   Creatinine 0.7 0.6 - 1.1 mg/dL   Total Bilirubin 0.78 0.20 - 1.20 mg/dL   Alkaline Phosphatase 65 40 - 150 U/L   AST 14 5 - 34 U/L   ALT 12 0 - 55 U/L   Total Protein 7.2 6.4 - 8.3 g/dL   Albumin 3.7 3.5 - 5.0 g/dL   Calcium 9.0 8.4 - 10.4 mg/dL   Anion Gap 10 3 - 11 mEq/L   EGFR >90 >90 ml/min/1.73 m2     Studies/Results:  Echocardiogram 09-21-2014 with EF 55-60% and no wall motion abnormalities.  Medications: I have reviewed the patient's current medications.Recommended miralax or senokot S daily as needed to keep bowels moving daily. She is taking ferrous sulfate correctly, tolerating without apparent difficulty (tho note constipation this week). Medication interaction search shows minoxidil ok with chemo and antiemetics. Reviewed decadron 8 mg bid with food x 3d beginning day prior  to taxotere. She will have neulasta on 09-06-14, timing due to insurance requirements.   DISCUSSION: She is pleased that she has tolerated treatment well thus far and is in agreement with continuing as planned. She asks if she should resume caffeine tablets to counteract fatigue from chemo; I have told her that it is preferable just to rest when she feels fatigued during treatment rather than large doses of caffeine. Will use ice on fingernails during taxotere infusion. She understands that day 8 of each cycle may have more side effects associated, and knows to call if questions or problems prior to next scheduled visit.   Per notes from financial staff, her insurance has requested that her chemo be provided by their mail order pharmacy. I am not aware that this is acceptable to our pharmacy; appropriate [facility] staff are addressing this  insurance request.  Assessment/Plan:  1.high grade endometrial stromal sarcoma with bilateral pelvic node involvement at TAH BSO, pelvic lymphadenectomy and other node sampling at [facility] 07-14-14.  She will have day 8 cycle 1 gemzar and taxotere on 09-04-14, with neulasta on 09-06-14. I will see her with labs on 09-11-14. 2.PAC in 3.morbid obesity, BMI 44.6 4.post gastric banding with initial weight loss 50 lbs 5.degenerative arthritis knees: symptoms much better with interventions by orthopedics in past week 6.overdue dental care tho no acute problems 7.patient stopped lovenox after ~ 2 of planned 4 week post op course. Clinically no evidence of DVT today 8.chronic alopecia such that hair loss with chemo is not concerning to her 9.no flu vaccine 10.history skin ca, apparently nonmelanoma 11,iron deficiency anemia related to gyn bleeding previously: oral iron and follow   Chemo and neulasta orders confirmed. Time spent 30 min including >50% counseling and coordination of care.     LIVESAY,LENNIS P, MD   09/01/2014, 9:42 AM

## 2014-09-04 ENCOUNTER — Ambulatory Visit (HOSPITAL_BASED_OUTPATIENT_CLINIC_OR_DEPARTMENT_OTHER): Payer: Federal, State, Local not specified - PPO

## 2014-09-04 ENCOUNTER — Other Ambulatory Visit (HOSPITAL_BASED_OUTPATIENT_CLINIC_OR_DEPARTMENT_OTHER): Payer: Federal, State, Local not specified - PPO

## 2014-09-04 DIAGNOSIS — Z5111 Encounter for antineoplastic chemotherapy: Secondary | ICD-10-CM

## 2014-09-04 DIAGNOSIS — C541 Malignant neoplasm of endometrium: Secondary | ICD-10-CM

## 2014-09-04 LAB — CBC WITH DIFFERENTIAL/PLATELET
BASO%: 0.5 % (ref 0.0–2.0)
Basophils Absolute: 0 10*3/uL (ref 0.0–0.1)
EOS%: 0.1 % (ref 0.0–7.0)
Eosinophils Absolute: 0 10*3/uL (ref 0.0–0.5)
HCT: 37 % (ref 34.8–46.6)
HGB: 11.6 g/dL (ref 11.6–15.9)
LYMPH%: 7 % — AB (ref 14.0–49.7)
MCH: 24.6 pg — ABNORMAL LOW (ref 25.1–34.0)
MCHC: 31.4 g/dL — ABNORMAL LOW (ref 31.5–36.0)
MCV: 78.4 fL — ABNORMAL LOW (ref 79.5–101.0)
MONO#: 0.3 10*3/uL (ref 0.1–0.9)
MONO%: 3.3 % (ref 0.0–14.0)
NEUT%: 89.1 % — AB (ref 38.4–76.8)
NEUTROS ABS: 7.9 10*3/uL — AB (ref 1.5–6.5)
PLATELETS: 240 10*3/uL (ref 145–400)
RBC: 4.72 10*6/uL (ref 3.70–5.45)
RDW: 17.1 % — AB (ref 11.2–14.5)
WBC: 8.9 10*3/uL (ref 3.9–10.3)
lymph#: 0.6 10*3/uL — ABNORMAL LOW (ref 0.9–3.3)

## 2014-09-04 LAB — COMPREHENSIVE METABOLIC PANEL (CC13)
ALT: 22 U/L (ref 0–55)
ANION GAP: 12 meq/L — AB (ref 3–11)
AST: 18 U/L (ref 5–34)
Albumin: 3.7 g/dL (ref 3.5–5.0)
Alkaline Phosphatase: 74 U/L (ref 40–150)
BILIRUBIN TOTAL: 0.63 mg/dL (ref 0.20–1.20)
BUN: 22.1 mg/dL (ref 7.0–26.0)
CO2: 22 meq/L (ref 22–29)
Calcium: 9.3 mg/dL (ref 8.4–10.4)
Chloride: 107 mEq/L (ref 98–109)
Creatinine: 0.7 mg/dL (ref 0.6–1.1)
EGFR: >90 mL/min/{1.73_m2} (ref >90)
Glucose: 115 mg/dl (ref 70–140)
POTASSIUM: 4.3 meq/L (ref 3.5–5.1)
Sodium: 140 mEq/L (ref 136–145)
TOTAL PROTEIN: 7.4 g/dL (ref 6.4–8.3)

## 2014-09-04 MED ORDER — SODIUM CHLORIDE 0.9 % IV SOLN
Freq: Once | INTRAVENOUS | Status: AC
  Administered 2014-09-04: 13:00:00 via INTRAVENOUS
  Filled 2014-09-04: qty 8

## 2014-09-04 MED ORDER — SODIUM CHLORIDE 0.9 % IV SOLN
900.0000 mg/m2 | Freq: Once | INTRAVENOUS | Status: AC
  Administered 2014-09-04: 1900 mg via INTRAVENOUS
  Filled 2014-09-04: qty 49.97

## 2014-09-04 MED ORDER — HEPARIN SOD (PORK) LOCK FLUSH 100 UNIT/ML IV SOLN
500.0000 [IU] | Freq: Once | INTRAVENOUS | Status: AC | PRN
  Administered 2014-09-04: 500 [IU]
  Filled 2014-09-04: qty 5

## 2014-09-04 MED ORDER — DOCETAXEL CHEMO INJECTION 160 MG/16ML
98.0000 mg/m2 | Freq: Once | INTRAVENOUS | Status: AC
  Administered 2014-09-04: 200 mg via INTRAVENOUS
  Filled 2014-09-04: qty 20

## 2014-09-04 MED ORDER — SODIUM CHLORIDE 0.9 % IJ SOLN
10.0000 mL | INTRAMUSCULAR | Status: DC | PRN
  Administered 2014-09-04: 10 mL
  Filled 2014-09-04: qty 10

## 2014-09-04 MED ORDER — SODIUM CHLORIDE 0.9 % IV SOLN
Freq: Once | INTRAVENOUS | Status: AC
  Administered 2014-09-04: 13:00:00 via INTRAVENOUS

## 2014-09-04 NOTE — Patient Instructions (Signed)
[facility] Discharge Instructions for Patients Receiving Chemotherapy  Today you received the following chemotherapy agents: Gemzar, Taxotere  To help prevent nausea and vomiting after your treatment, we encourage you to take your nausea medication: Zofran 8 mg every 8 hours as needed.   If you develop nausea and vomiting that is not controlled by your nausea medication, call the clinic.   BELOW ARE SYMPTOMS THAT SHOULD BE REPORTED IMMEDIATELY:  *FEVER GREATER THAN 100.5 F  *CHILLS WITH OR WITHOUT FEVER  NAUSEA AND VOMITING THAT IS NOT CONTROLLED WITH YOUR NAUSEA MEDICATION  *UNUSUAL SHORTNESS OF BREATH  *UNUSUAL BRUISING OR BLEEDING  TENDERNESS IN MOUTH AND THROAT WITH OR WITHOUT PRESENCE OF ULCERS  *URINARY PROBLEMS  *BOWEL PROBLEMS  UNUSUAL RASH Items with * indicate a potential emergency and should be followed up as soon as possible.  Feel free to call the clinic you have any questions or concerns. The clinic phone number is [phone].  Please show the [facility] at check-in to the Emergency Department and triage nurse.

## 2014-09-06 ENCOUNTER — Other Ambulatory Visit: Payer: Self-pay | Admitting: *Deleted

## 2014-09-06 ENCOUNTER — Ambulatory Visit (HOSPITAL_BASED_OUTPATIENT_CLINIC_OR_DEPARTMENT_OTHER): Payer: Federal, State, Local not specified - PPO

## 2014-09-06 ENCOUNTER — Other Ambulatory Visit: Payer: Self-pay | Admitting: Oncology

## 2014-09-06 DIAGNOSIS — C541 Malignant neoplasm of endometrium: Secondary | ICD-10-CM

## 2014-09-06 DIAGNOSIS — Z5189 Encounter for other specified aftercare: Secondary | ICD-10-CM | POA: Diagnosis not present

## 2014-09-06 MED ORDER — MAGIC MOUTHWASH W/LIDOCAINE: ORAL | Status: DC

## 2014-09-06 MED ORDER — PEGFILGRASTIM INJECTION 6 MG/0.6ML ~~LOC~~
6.0000 mg | PREFILLED_SYRINGE | Freq: Once | SUBCUTANEOUS | Status: AC
  Administered 2014-09-06: 6 mg via SUBCUTANEOUS

## 2014-09-06 NOTE — Telephone Encounter (Signed)
Pt came in for neulasta post chemo and stated mouth and throat tenderness.  Per assessment noted areas of redness / no white patches .  Jaymes also states some minimal gum irritation with bleeding.  Plan is magic mouth wash will be called in for symptom management.

## 2014-09-06 NOTE — Progress Notes (Signed)
Pt c/o mouth soreness.  Instructed her to continue using Biotene as needed and called in Rx for Magic Mouthwash to her pharmacy per on call MD.  Pt verbalized understanding on using MMW and also to use Saline Rinses three to four times daily.  Pt verbalized understanding to call if her mouth sores get any worse.

## 2014-09-08 ENCOUNTER — Telehealth: Payer: Self-pay | Admitting: Nurse Practitioner

## 2014-09-08 ENCOUNTER — Other Ambulatory Visit: Payer: Self-pay

## 2014-09-08 ENCOUNTER — Encounter: Payer: Self-pay | Admitting: Nurse Practitioner

## 2014-09-08 ENCOUNTER — Ambulatory Visit (HOSPITAL_BASED_OUTPATIENT_CLINIC_OR_DEPARTMENT_OTHER): Payer: Federal, State, Local not specified - PPO | Admitting: Nurse Practitioner

## 2014-09-08 VITALS — BP 120/64 | HR 81 | Temp 98.7°F | Resp 20 | Wt 231.5 lb

## 2014-09-08 DIAGNOSIS — M791 Myalgia, unspecified site: Secondary | ICD-10-CM

## 2014-09-08 DIAGNOSIS — C541 Malignant neoplasm of endometrium: Secondary | ICD-10-CM

## 2014-09-08 DIAGNOSIS — K1231 Oral mucositis (ulcerative) due to antineoplastic therapy: Secondary | ICD-10-CM

## 2014-09-08 MED ORDER — TRAMADOL HCL 50 MG PO TABS: ORAL_TABLET | ORAL | Status: DC

## 2014-09-08 NOTE — Telephone Encounter (Signed)
Per 04/04 POF pt added on to NP/CB schedule pt in lobby.... KJ

## 2014-09-08 NOTE — Assessment & Plan Note (Signed)
Patient received her first Neulasta injection for growth factor support on Saturday, 09/06/2014.  Within 24 hours patient was experiencing some myalgia to her bilateral lower extremities from her thighs to her feet.  Patient denies any chest pain/pressure, shortness of breath or pain with inspiration.  Patient states that she took her Claritin only once on the day of the Neulasta injection only.  On exam-patient with no obvious tenderness with deep palpation to her bilateral legs; no specific calf tenderness.  There is no erythema, no warmth, no edema, and no red streaks.  All pulses are palpable extremities are warm.  Patient has a history of chronic bilateral knee arthritis; and states that she intends to have bilateral knee surgery when she is finished with all of her cancer treatments.  Advised patient that her bone aches/myalgia is most likely secondary to Neulasta injection.  Advised that she take Claritin for several days surrounding her next Neulasta injection.  Also gave patient a prescription for Ultram.  Patient states she is highly sensitive to most medications-so advised she try taking half of an Ultram tablet to see if this helps with her achiness.  She was also encouraged to drink plenty of fluids and remain as active as possible.

## 2014-09-08 NOTE — Assessment & Plan Note (Signed)
Patient completed cycle 1, day 8 of his gemcitabine/docetaxel chemotherapy regimen on 09/04/2014.  She received her first Neulasta injection on 09/06/2014.  She is scheduled to return for cycle 2, day 1 of the same regimen on 09/18/2014.  Patient is also scheduled for labs and a follow-up visit with Dr. Marko Plume on 09/11/2014.  Patient knows to call in the interim with any new worries or concerns whatsoever.

## 2014-09-08 NOTE — Assessment & Plan Note (Signed)
Patient called the [facility] on-call service the weekend with complaint of chemotherapy-induced mucositis.  She denies any oral lesions; but is complaining of some mild mouth sensitivity.  She was encouraged to continue with Biotene rinses and salt/baking soda rinses.  She was also prescribed Magic mouthwash with lidocaine.  Patient states she has been using the Magic mouthwash as directed; and her mouth sensitivity has greatly improved.

## 2014-09-08 NOTE — Progress Notes (Signed)
SYMPTOM MANAGEMENT CLINIC   HPI: Erin Santiago 59 y.o. 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  Patient is status post hysterectomy in February 2016.  Currently undergoing gemcitabine/docetaxel chemotherapy regimen.  Patient completed her first cycle of chemotherapy on 09/04/2014.  She received her first Neulasta injection for growth factor support on Saturday, 09/06/2014.  Within less than 24 hours patient began experiencing bilateral lower extremity achiness/myalgias.  Patient has a history of chronic bilateral knee pain due to severe arthritis; and states that she plans to have bilateral knee surgery once all of her cancer treatment is completed.  Patient denies any chest pain, chest pressure, shortness breath, or pain with inspiration.  She denies any recent fevers or chills.  Patient also called the cancer Center on call service over the weekend complaining of some very mild mucositis.  She states she has no oral lesions; but her oral mucosa is sensitive.  She was advised at that time to continue with Biotene mouth rinses and baking soda/salt rinses as well.  She was prescribed Magic mouthwash with lidocaine at that time.  Patient states that the Magic mouthwash formula has greatly improved follow for mouth sensitivity.   HPI  ROS  Past Medical History  Diagnosis Date  . Abnormal uterine bleeding   . Anemia   . Fibroid   . Heart murmur     under 5 yrs of age    Past Surgical History  Procedure Laterality Date  . Laparoscopic gastric sleeve resection    . Abdominal hysterectomy  07/2014    at [facility]    has Vaginal bleeding; Anemia; Fibroid; Postmenopausal vaginal bleeding; Acute blood loss anemia; Leukocytosis; Endometrial stromal sarcoma; Morbid obesity with BMI of 40.0-44.9, adult; Poor venous access; Hx of laparoscopic gastric banding; Iron deficiency anemia due to chronic blood loss; Alopecia; Arthritis of both knees; Myalgia; and Mucositis due to chemotherapy on  her problem list.    has No Known Allergies.    Medication List       This list is accurate as of: 09/08/14 10:13 AM.  Always use your most recent med list.               dexamethasone 4 MG tablet  Commonly known as:  DECADRON  Take 2 tabs with food twice a day x 3 days beginning day prior to Taxotere(Second week of Chemo)     docusate sodium 100 MG capsule  Commonly known as:  COLACE  Take 100 mg by mouth 2 (two) times daily.     ferrous sulfate 325 (65 FE) MG tablet  Take 1 tablet (325 mg total) by mouth daily with breakfast.     hydrocortisone 2.5 % cream  Apply 1 application topically as needed (itch).     lidocaine-prilocaine cream  Commonly known as:  EMLA  Apply to Porta-cath 1-2 hrs prior to access. Cover with [employer].     LORazepam 1 MG tablet  Commonly known as:  ATIVAN  Place 1/2-1 tablet under the tongue or swallow every 6 hrs as needed for nausea. Will make you drowsy.     magic mouthwash w/lidocaine Soln  QID prn for mouth/throat irriation- may swish swallow and or spit     meloxicam 15 MG tablet  Commonly known as:  MOBIC  Take 15 mg by mouth daily.     ondansetron 8 MG disintegrating tablet  Commonly known as:  ZOFRAN ODT  Take 1 tablet (8 mg total) by mouth every 8 (eight) hours as  needed for nausea or vomiting.     Psyllium 28.3 % Powd  Take 1 packet by mouth daily.     traMADol 50 MG tablet  Commonly known as:  ULTRAM  May take 25-50 mg PO BID PRN. (1/2 to 1 whole tab)         PHYSICAL EXAMINATION  Oncology Vitals 09/08/2014 09/06/2014 09/04/2014 09/04/2014 09/04/2014 09/04/2014 09/04/2014  Height - - - - - - -  Weight 105.008 kg - - - - - -  Weight (lbs) 231 lbs 8 oz - - - - - -  BMI (kg/m2) - - - - - - -  Temp 98.7 98.2 98.4 97.8 97.9 98.5 97.8  Pulse 81 66 73 82 74 71 73  Resp $Rem'20 18 18 18 18 18 18  'zgTM$ SpO2 - - 100 100 100 100 100  BSA (m2) - - - - - - -   BP Readings from Last 3 Encounters:  09/08/14 120/64  09/06/14 111/63  09/04/14  133/64    Physical Exam  Constitutional: She is oriented to person, place, and time. Vital signs are normal. She appears unhealthy.  Patient is wearing a wig today.  HENT:  Head: Normocephalic.  Mouth/Throat: Oropharynx is clear and moist. No oropharyngeal exudate.  Eyes: Conjunctivae and EOM are normal. Pupils are equal, round, and reactive to light. Right eye exhibits no discharge. Left eye exhibits no discharge. No scleral icterus.  Neck: Normal range of motion. Neck supple. No JVD present. No tracheal deviation present. No thyromegaly present.  Cardiovascular: Normal rate, regular rhythm, normal heart sounds and intact distal pulses.   Pulmonary/Chest: Effort normal and breath sounds normal. No respiratory distress. She has no wheezes. She has no rales. She exhibits no tenderness.  Abdominal: Soft. Bowel sounds are normal. She exhibits no distension and no mass. There is no tenderness. There is no rebound and no guarding.  Musculoskeletal: She exhibits no edema or tenderness.  Patient with no specific tenderness with deep palpation to bilateral legs.  However, patient does walk with an abnormal gait due to her chronic bilateral knee pain from arthritis.  Lymphadenopathy:    She has no cervical adenopathy.  Neurological: She is alert and oriented to person, place, and time.  Skin: Skin is warm and dry. No rash noted. No erythema. No pallor.  New right upper chest Port-A-Cath intact; with healing bruising.  No evidence of infection.  Also, patient has healing bruises to her right forearm from previous IV sticks.  Psychiatric: Affect normal.  Nursing note and vitals reviewed.   LABORATORY DATA:. No visits with results within 3 Day(s) from this visit. Latest known visit with results is:  Appointment on 09/04/2014  Component Date Value Ref Range Status  . WBC 09/04/2014 8.9  3.9 - 10.3 10e3/uL Final  . NEUT# 09/04/2014 7.9* 1.5 - 6.5 10e3/uL Final  . HGB 09/04/2014 11.6  11.6 - 15.9  g/dL Final  . HCT 09/04/2014 37.0  34.8 - 46.6 % Final  . Platelets 09/04/2014 240  145 - 400 10e3/uL Final  . MCV 09/04/2014 78.4* 79.5 - 101.0 fL Final  . MCH 09/04/2014 24.6* 25.1 - 34.0 pg Final  . MCHC 09/04/2014 31.4* 31.5 - 36.0 g/dL Final  . RBC 09/04/2014 4.72  3.70 - 5.45 10e6/uL Final  . RDW 09/04/2014 17.1* 11.2 - 14.5 % Final  . lymph# 09/04/2014 0.6* 0.9 - 3.3 10e3/uL Final  . MONO# 09/04/2014 0.3  0.1 - 0.9 10e3/uL Final  . Eosinophils Absolute  09/04/2014 0.0  0.0 - 0.5 10e3/uL Final  . Basophils Absolute 09/04/2014 0.0  0.0 - 0.1 10e3/uL Final  . NEUT% 09/04/2014 89.1* 38.4 - 76.8 % Final  . LYMPH% 09/04/2014 7.0* 14.0 - 49.7 % Final  . MONO% 09/04/2014 3.3  0.0 - 14.0 % Final  . EOS% 09/04/2014 0.1  0.0 - 7.0 % Final  . BASO% 09/04/2014 0.5  0.0 - 2.0 % Final  . Sodium 09/04/2014 140  136 - 145 mEq/L Final  . Potassium 09/04/2014 4.3  3.5 - 5.1 mEq/L Final  . Chloride 09/04/2014 107  98 - 109 mEq/L Final  . CO2 09/04/2014 22  22 - 29 mEq/L Final  . Glucose 09/04/2014 115  70 - 140 mg/dl Final  . BUN 09/04/2014 22.1  7.0 - 26.0 mg/dL Final  . Creatinine 09/04/2014 0.7  0.6 - 1.1 mg/dL Final  . Total Bilirubin 09/04/2014 0.63  0.20 - 1.20 mg/dL Final  . Alkaline Phosphatase 09/04/2014 74  40 - 150 U/L Final  . AST 09/04/2014 18  5 - 34 U/L Final  . ALT 09/04/2014 22  0 - 55 U/L Final  . Total Protein 09/04/2014 7.4  6.4 - 8.3 g/dL Final  . Albumin 09/04/2014 3.7  3.5 - 5.0 g/dL Final  . Calcium 09/04/2014 9.3  8.4 - 10.4 mg/dL Final  . Anion Gap 09/04/2014 12* 3 - 11 mEq/L Final  . EGFR 09/04/2014 >90  >90 ml/min/1.73 m2 Final   eGFR is calculated using the CKD-EPI Creatinine Equation (2009)     RADIOGRAPHIC STUDIES: No results found.  ASSESSMENT/PLAN:    Myalgia Patient received her first Neulasta injection for growth factor support on Saturday, 09/06/2014.  Within 24 hours patient was experiencing some myalgia to her bilateral lower extremities from her  thighs to her feet.  Patient denies any chest pain/pressure, shortness of breath or pain with inspiration.  Patient states that she took her Claritin only once on the day of the Neulasta injection only.  On exam-patient with no obvious tenderness with deep palpation to her bilateral legs; no specific calf tenderness.  There is no erythema, no warmth, no edema, and no red streaks.  All pulses are palpable extremities are warm.  Patient has a history of chronic bilateral knee arthritis; and states that she intends to have bilateral knee surgery when she is finished with all of her cancer treatments.  Advised patient that her bone aches/myalgia is most likely secondary to Neulasta injection.  Advised that she take Claritin for several days surrounding her next Neulasta injection.  Also gave patient a prescription for Ultram.  Patient states she is highly sensitive to most medications-so advised she try taking half of an Ultram tablet to see if this helps with her achiness.  She was also encouraged to drink plenty of fluids and remain as active as possible.   Mucositis due to chemotherapy Patient called the [facility] on-call service the weekend with complaint of chemotherapy-induced mucositis.  She denies any oral lesions; but is complaining of some mild mouth sensitivity.  She was encouraged to continue with Biotene rinses and salt/baking soda rinses.  She was also prescribed Magic mouthwash with lidocaine.  Patient states she has been using the Magic mouthwash as directed; and her mouth sensitivity has greatly improved.   Endometrial stromal sarcoma Patient completed cycle 1, day 8 of his gemcitabine/docetaxel chemotherapy regimen on 09/04/2014.  She received her first Neulasta injection on 09/06/2014.  She is scheduled to return for cycle  2, day 1 of the same regimen on 09/18/2014.  Patient is also scheduled for labs and a follow-up visit with Dr. Marko Plume on 09/11/2014.  Patient knows to call in  the interim with any new worries or concerns whatsoever.     Patient stated understanding of all instructions; and was in agreement with this plan of care. The patient knows to call the clinic with any problems, questions or concerns.   Review/collaboration with Dr. Marko Plume regarding all aspects of patient's visit today.   Total time spent with patient was 25 minutes;  with greater than 75 percent of that time spent in face to face counseling regarding patient's symptoms,  and coordination of care and follow up.  Disclaimer: This note was dictated with voice recognition software. Similar sounding words can inadvertently be transcribed and may not be corrected upon review.   Drue Second, NP 09/08/2014   Physical

## 2014-09-10 ENCOUNTER — Other Ambulatory Visit (HOSPITAL_BASED_OUTPATIENT_CLINIC_OR_DEPARTMENT_OTHER): Payer: Federal, State, Local not specified - PPO

## 2014-09-10 ENCOUNTER — Encounter (HOSPITAL_COMMUNITY): Payer: Self-pay | Admitting: *Deleted

## 2014-09-10 ENCOUNTER — Inpatient Hospital Stay (HOSPITAL_COMMUNITY)
Admission: AD | Admit: 2014-09-10 | Discharge: 2014-09-12 | DRG: 809 | Disposition: A | Discharge status: Home / Self Care | Payer: Federal, State, Local not specified - PPO | Source: Ambulatory Visit | Attending: Internal Medicine | Admitting: Internal Medicine

## 2014-09-10 ENCOUNTER — Ambulatory Visit (HOSPITAL_BASED_OUTPATIENT_CLINIC_OR_DEPARTMENT_OTHER): Payer: Federal, State, Local not specified - PPO | Admitting: Nurse Practitioner

## 2014-09-10 ENCOUNTER — Inpatient Hospital Stay (HOSPITAL_COMMUNITY): Payer: Federal, State, Local not specified - PPO

## 2014-09-10 ENCOUNTER — Telehealth: Payer: Self-pay | Admitting: *Deleted

## 2014-09-10 ENCOUNTER — Encounter: Payer: Self-pay | Admitting: Nurse Practitioner

## 2014-09-10 VITALS — BP 113/58 | HR 81 | Temp 99.4°F | Resp 18 | Wt 226.9 lb

## 2014-09-10 DIAGNOSIS — D500 Iron deficiency anemia secondary to blood loss (chronic): Secondary | ICD-10-CM | POA: Diagnosis present

## 2014-09-10 DIAGNOSIS — C541 Malignant neoplasm of endometrium: Secondary | ICD-10-CM | POA: Diagnosis present

## 2014-09-10 DIAGNOSIS — D696 Thrombocytopenia, unspecified: Secondary | ICD-10-CM

## 2014-09-10 DIAGNOSIS — C779 Secondary and unspecified malignant neoplasm of lymph node, unspecified: Secondary | ICD-10-CM | POA: Diagnosis present

## 2014-09-10 DIAGNOSIS — K1231 Oral mucositis (ulcerative) due to antineoplastic therapy: Secondary | ICD-10-CM

## 2014-09-10 DIAGNOSIS — Z9884 Bariatric surgery status: Secondary | ICD-10-CM

## 2014-09-10 DIAGNOSIS — R5081 Fever presenting with conditions classified elsewhere: Secondary | ICD-10-CM | POA: Diagnosis present

## 2014-09-10 DIAGNOSIS — M791 Myalgia, unspecified site: Secondary | ICD-10-CM

## 2014-09-10 DIAGNOSIS — E860 Dehydration: Secondary | ICD-10-CM | POA: Insufficient documentation

## 2014-09-10 DIAGNOSIS — Z6841 Body Mass Index (BMI) 40.0 and over, adult: Secondary | ICD-10-CM

## 2014-09-10 DIAGNOSIS — D6959 Other secondary thrombocytopenia: Secondary | ICD-10-CM | POA: Diagnosis present

## 2014-09-10 DIAGNOSIS — R509 Fever, unspecified: Secondary | ICD-10-CM | POA: Diagnosis present

## 2014-09-10 DIAGNOSIS — D709 Neutropenia, unspecified: Secondary | ICD-10-CM

## 2014-09-10 DIAGNOSIS — B370 Candidal stomatitis: Secondary | ICD-10-CM | POA: Diagnosis present

## 2014-09-10 DIAGNOSIS — D701 Agranulocytosis secondary to cancer chemotherapy: Secondary | ICD-10-CM | POA: Diagnosis not present

## 2014-09-10 DIAGNOSIS — M1990 Unspecified osteoarthritis, unspecified site: Secondary | ICD-10-CM | POA: Diagnosis present

## 2014-09-10 DIAGNOSIS — T451X5A Adverse effect of antineoplastic and immunosuppressive drugs, initial encounter: Secondary | ICD-10-CM | POA: Diagnosis present

## 2014-09-10 DIAGNOSIS — Z79899 Other long term (current) drug therapy: Secondary | ICD-10-CM

## 2014-09-10 DIAGNOSIS — R040 Epistaxis: Secondary | ICD-10-CM

## 2014-09-10 HISTORY — DX: Malignant (primary) neoplasm, unspecified: C80.1

## 2014-09-10 LAB — CBC WITH DIFFERENTIAL/PLATELET
BASO%: 0 % (ref 0.0–2.0)
Basophils Absolute: 0 10*3/uL (ref 0.0–0.1)
EOS%: 7.8 % — AB (ref 0.0–7.0)
Eosinophils Absolute: 0.1 10*3/uL (ref 0.0–0.5)
HCT: 34.7 % — ABNORMAL LOW (ref 34.8–46.6)
HEMOGLOBIN: 11.1 g/dL — AB (ref 11.6–15.9)
LYMPH%: 50 % — AB (ref 14.0–49.7)
MCH: 25 pg — AB (ref 25.1–34.0)
MCHC: 32 g/dL (ref 31.5–36.0)
MCV: 78.2 fL — AB (ref 79.5–101.0)
MONO#: 0.2 10*3/uL (ref 0.1–0.9)
MONO%: 17.6 % — AB (ref 0.0–14.0)
NEUT#: 0.3 10*3/uL — CL (ref 1.5–6.5)
NEUT%: 24.6 % — AB (ref 38.4–76.8)
Platelets: 75 10*3/uL — ABNORMAL LOW (ref 145–400)
RBC: 4.44 10*6/uL (ref 3.70–5.45)
RDW: 16.8 % — ABNORMAL HIGH (ref 11.2–14.5)
WBC: 1 10*3/uL — AB (ref 3.9–10.3)
lymph#: 0.5 10*3/uL — ABNORMAL LOW (ref 0.9–3.3)

## 2014-09-10 LAB — URINALYSIS, MICROSCOPIC - CHCC
BLOOD: NEGATIVE
Bilirubin (Urine): NEGATIVE
GLUCOSE UR CHCC: NEGATIVE mg/dL
Ketones: NEGATIVE mg/dL
Leukocyte Esterase: NEGATIVE
NITRITE: NEGATIVE
Protein: NEGATIVE mg/dL
RBC / HPF: NEGATIVE (ref 0–2)
Specific Gravity, Urine: 1.005 (ref 1.003–1.035)
Urobilinogen, UR: 0.2 mg/dL (ref 0.2–1)
pH: 8 (ref 4.6–8.0)

## 2014-09-10 LAB — COMPREHENSIVE METABOLIC PANEL (CC13)
ALT: 18 U/L (ref 0–55)
ANION GAP: 10 meq/L (ref 3–11)
AST: 16 U/L (ref 5–34)
Albumin: 3.2 g/dL — ABNORMAL LOW (ref 3.5–5.0)
Alkaline Phosphatase: 65 U/L (ref 40–150)
BILIRUBIN TOTAL: 1.25 mg/dL — AB (ref 0.20–1.20)
BUN: 12.1 mg/dL (ref 7.0–26.0)
CALCIUM: 9 mg/dL (ref 8.4–10.4)
CHLORIDE: 101 meq/L (ref 98–109)
CO2: 25 meq/L (ref 22–29)
Creatinine: 0.7 mg/dL (ref 0.6–1.1)
Glucose: 101 mg/dl (ref 70–140)
Potassium: 4.1 mEq/L (ref 3.5–5.1)
SODIUM: 136 meq/L (ref 136–145)
TOTAL PROTEIN: 6.7 g/dL (ref 6.4–8.3)

## 2014-09-10 LAB — LACTIC ACID, PLASMA: LACTIC ACID, VENOUS: 0.9 mmol/L (ref 0.5–2.0)

## 2014-09-10 MED ORDER — ONDANSETRON HCL 4 MG PO TABS: 4.0000 mg | ORAL_TABLET | Freq: Four times a day (QID) | ORAL | Status: DC | PRN

## 2014-09-10 MED ORDER — ACETAMINOPHEN 325 MG PO TABS: 650.0000 mg | ORAL_TABLET | Freq: Four times a day (QID) | ORAL | Status: DC | PRN

## 2014-09-10 MED ORDER — SODIUM CHLORIDE 0.9 % IV SOLN
INTRAVENOUS | Status: AC
  Administered 2014-09-10: 22:00:00 via INTRAVENOUS

## 2014-09-10 MED ORDER — VANCOMYCIN HCL IN DEXTROSE 750-5 MG/150ML-% IV SOLN
750.0000 mg | Freq: Two times a day (BID) | INTRAVENOUS | Status: DC
  Administered 2014-09-11 (×2): 750 mg via INTRAVENOUS
  Filled 2014-09-10 (×3): qty 150

## 2014-09-10 MED ORDER — ONDANSETRON HCL 4 MG/2ML IJ SOLN: 4.0000 mg | Freq: Four times a day (QID) | INTRAMUSCULAR | Status: DC | PRN

## 2014-09-10 MED ORDER — BIOTENE DRY MOUTH MT LIQD: 15.0000 mL | OROMUCOSAL | Status: DC | PRN

## 2014-09-10 MED ORDER — TRAMADOL HCL 50 MG PO TABS: 50.0000 mg | ORAL_TABLET | Freq: Four times a day (QID) | ORAL | Status: DC | PRN

## 2014-09-10 MED ORDER — MAGIC MOUTHWASH W/LIDOCAINE
5.0000 mL | Freq: Three times a day (TID) | ORAL | Status: DC
  Administered 2014-09-10 – 2014-09-12 (×5): 5 mL via ORAL
  Filled 2014-09-10 (×7): qty 5

## 2014-09-10 MED ORDER — VANCOMYCIN HCL 10 G IV SOLR
2000.0000 mg | INTRAVENOUS | Status: AC
  Administered 2014-09-10: 2000 mg via INTRAVENOUS
  Filled 2014-09-10: qty 2000

## 2014-09-10 MED ORDER — FERROUS SULFATE 325 (65 FE) MG PO TABS
325.0000 mg | ORAL_TABLET | Freq: Every day | ORAL | Status: DC
  Administered 2014-09-11 – 2014-09-12 (×2): 325 mg via ORAL
  Filled 2014-09-10 (×4): qty 1

## 2014-09-10 MED ORDER — FLUCONAZOLE IN SODIUM CHLORIDE 200-0.9 MG/100ML-% IV SOLN
200.0000 mg | INTRAVENOUS | Status: DC
  Administered 2014-09-10 – 2014-09-11 (×2): 200 mg via INTRAVENOUS
  Filled 2014-09-10 (×2): qty 100

## 2014-09-10 MED ORDER — MELOXICAM 15 MG PO TABS
15.0000 mg | ORAL_TABLET | Freq: Every day | ORAL | Status: DC
  Administered 2014-09-10 – 2014-09-12 (×3): 15 mg via ORAL
  Filled 2014-09-10 (×3): qty 1

## 2014-09-10 MED ORDER — PIPERACILLIN-TAZOBACTAM 3.375 G IVPB
3.3750 g | Freq: Three times a day (TID) | INTRAVENOUS | Status: DC
  Administered 2014-09-10 – 2014-09-12 (×5): 3.375 g via INTRAVENOUS
  Filled 2014-09-10 (×5): qty 50

## 2014-09-10 MED ORDER — ACETAMINOPHEN 650 MG RE SUPP: 650.0000 mg | Freq: Four times a day (QID) | RECTAL | Status: DC | PRN

## 2014-09-10 NOTE — Plan of Care (Signed)
[facility] Admissions paged at 1943 requesting admission orders for patient.  Awaiting confirmation of page and orders to be placed.  Petra Kuba RN

## 2014-09-10 NOTE — Progress Notes (Signed)
SYMPTOM MANAGEMENT CLINIC   HPI: Erin Santiago 59 y.o. 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  Patient is status post hysterectomy in February 2016.  Currently undergoing gemcitabine/docetaxel chemotherapy regimen.  Patient completed her first cycle of chemotherapy on 09/04/2014.  She received her first Neulasta injection for growth factor support on Saturday, 09/06/2014.  Within less than 24 hours patient began experiencing bilateral lower extremity achiness/myalgias.  Patient has a history of chronic bilateral knee pain due to severe arthritis; and states that she plans to have bilateral knee surgery once all of her cancer treatment is completed. Patient states that her myalgias have improved over the last few days.  Patient is also developed some fairly severe chemotherapy-there is no clubbinginduced mucositis in the past few days.  She is complaining of multiple oral lesions that are very tender.  She does feel slightly dehydrated today secondary to poor oral intake due to her mucositis pain.  She has been using the Magic mouthwash with nystatin and lidocaine only once per day.  She also uses Biotene mouth rinse as directed.  Also, patient reports that she awoke in the middle of the night with attempt of 102.1.  Her only new complaint other than mucositis is some very mild new onset nasal congestion.  She reports that she had a brief episode of throat pain; but that is cleared.  Patient states that she took Aleve at 2:00 this afternoon.  Patient denies any nausea, vomiting, diarrhea, or constipation.  Fever     Review of Systems  Constitutional: Positive for fever.    Past Medical History  Diagnosis Date  . Abnormal uterine bleeding   . Anemia   . Fibroid   . Heart murmur     under 5 yrs of age    Past Surgical History  Procedure Laterality Date  . Laparoscopic gastric sleeve resection    . Abdominal hysterectomy  07/2014    at [facility]    has Vaginal bleeding;  Anemia; Fibroid; Postmenopausal vaginal bleeding; Acute blood loss anemia; Leukocytosis; Endometrial stromal sarcoma; Morbid obesity with BMI of 40.0-44.9, adult; Poor venous access; Hx of laparoscopic gastric banding; Iron deficiency anemia due to chronic blood loss; Alopecia; Arthritis of both knees; Myalgia; Mucositis due to chemotherapy; Neutropenic fever; and Thrombocytopenia on her problem list.    has No Known Allergies.    Medication List       This list is accurate as of: 09/10/14  5:08 PM.  Always use your most recent med list.               dexamethasone 4 MG tablet  Commonly known as:  DECADRON  Take 2 tabs with food twice a day x 3 days beginning day prior to Taxotere(Second week of Chemo)     docusate sodium 100 MG capsule  Commonly known as:  COLACE  Take 100 mg by mouth 2 (two) times daily.     ferrous sulfate 325 (65 FE) MG tablet  Take 1 tablet (325 mg total) by mouth daily with breakfast.     hydrocortisone 2.5 % cream  Apply 1 application topically as needed (itch).     lidocaine-prilocaine cream  Commonly known as:  EMLA  Apply to Porta-cath 1-2 hrs prior to access. Cover with [employer].     LORazepam 1 MG tablet  Commonly known as:  ATIVAN  Place 1/2-1 tablet under the tongue or swallow every 6 hrs as needed for nausea. Will make you drowsy.  magic mouthwash w/lidocaine Soln  QID prn for mouth/throat irriation- may swish swallow and or spit     meloxicam 15 MG tablet  Commonly known as:  MOBIC  Take 15 mg by mouth daily.     ondansetron 8 MG disintegrating tablet  Commonly known as:  ZOFRAN ODT  Take 1 tablet (8 mg total) by mouth every 8 (eight) hours as needed for nausea or vomiting.     Psyllium 28.3 % Powd  Take 1 packet by mouth daily.     traMADol 50 MG tablet  Commonly known as:  ULTRAM  May take 25-50 mg PO BID PRN. (1/2 to 1 whole tab)         PHYSICAL EXAMINATION  Oncology Vitals 09/10/2014 09/08/2014 09/06/2014 09/04/2014  09/04/2014 09/04/2014 09/04/2014  Height - - - - - - -  Weight 102.921 kg 105.008 kg - - - - -  Weight (lbs) 226 lbs 14 oz 231 lbs 8 oz - - - - -  BMI (kg/m2) - - - - - - -  Temp 99.4 98.7 98.2 98.4 97.8 97.9 98.5  Pulse 81 81 66 73 82 74 71  Resp _0 SpO2 - - - 100 100 100 100  BSA (m2) - - - - - - -   BP Readings from Last 3 Encounters:  09/10/14 113/58  09/08/14 120/64  09/06/14 111/63    Physical Exam  Constitutional: She is oriented to person, place, and time. Vital signs are normal. She appears unhealthy.  Patient is wearing a wig today.  HENT:  Head: Normocephalic.  Mouth/Throat: No oropharyngeal exudate.  Mild nasal congestion on exam.  Patient with fairly severe mucositis; with increased erythema and edema to entire oral mucosa.  Also has multiple white lesions to the oral mucosa that are quite tender as well.  Eyes: Conjunctivae and EOM are normal. Pupils are equal, round, and reactive to light. Right eye exhibits no discharge. Left eye exhibits no discharge. No scleral icterus.  Neck: Normal range of motion. Neck supple. No JVD present. No tracheal deviation present. No thyromegaly present.  Cardiovascular: Normal rate, regular rhythm, normal heart sounds and intact distal pulses.   Pulmonary/Chest: Effort normal and breath sounds normal. No respiratory distress. She has no wheezes. She has no rales. She exhibits no tenderness.  Abdominal: Soft. Bowel sounds are normal. She exhibits no distension and no mass. There is no tenderness. There is no rebound and no guarding.  Musculoskeletal: She exhibits no edema or tenderness.  Patient with no specific tenderness with deep palpation to bilateral legs.  However, patient does walk with an abnormal gait due to her chronic bilateral knee pain from arthritis.  Lymphadenopathy:    She has no cervical adenopathy.  Neurological: She is alert and oriented to person, place, and time.  Skin: Skin is warm and dry. No  rash noted. No erythema. No pallor.  New right upper chest Port-A-Cath intact; with healing bruising.  No evidence of infection.  Also, patient has healing bruises to her right forearm from previous IV sticks.  Psychiatric: Affect normal.  Nursing note and vitals reviewed.   LABORATORY DATA:. Appointment on 09/10/2014  Component Date Value Ref Range Status  . WBC 09/10/2014 1.0* 3.9 - 10.3 10e3/uL Final  . NEUT# 09/10/2014 0.3* 1.5 - 6.5 10e3/uL Final  . HGB 09/10/2014 11.1* 11.6 - 15.9 g/dL Final  . HCT 09/10/2014 34.7* 34.8 - 46.6 % Final  . Platelets 09/10/2014  75* 145 - 400 10e3/uL Final  . MCV 09/10/2014 78.2* 79.5 - 101.0 fL Final  . MCH 09/10/2014 25.0* 25.1 - 34.0 pg Final  . MCHC 09/10/2014 32.0  31.5 - 36.0 g/dL Final  . RBC 09/10/2014 4.44  3.70 - 5.45 10e6/uL Final  . RDW 09/10/2014 16.8* 11.2 - 14.5 % Final  . lymph# 09/10/2014 0.5* 0.9 - 3.3 10e3/uL Final  . MONO# 09/10/2014 0.2  0.1 - 0.9 10e3/uL Final  . Eosinophils Absolute 09/10/2014 0.1  0.0 - 0.5 10e3/uL Final  . Basophils Absolute 09/10/2014 0.0  0.0 - 0.1 10e3/uL Final  . NEUT% 09/10/2014 24.6* 38.4 - 76.8 % Final  . LYMPH% 09/10/2014 50.0* 14.0 - 49.7 % Final  . MONO% 09/10/2014 17.6* 0.0 - 14.0 % Final  . EOS% 09/10/2014 7.8* 0.0 - 7.0 % Final  . BASO% 09/10/2014 0.0  0.0 - 2.0 % Final  . Sodium 09/10/2014 136  136 - 145 mEq/L Final  . Potassium 09/10/2014 4.1  3.5 - 5.1 mEq/L Final  . Chloride 09/10/2014 101  98 - 109 mEq/L Final  . CO2 09/10/2014 25  22 - 29 mEq/L Final  . Glucose 09/10/2014 101  70 - 140 mg/dl Final  . BUN 09/10/2014 12.1  7.0 - 26.0 mg/dL Final  . Creatinine 09/10/2014 0.7  0.6 - 1.1 mg/dL Final  . Total Bilirubin 09/10/2014 1.25* 0.20 - 1.20 mg/dL Final  . Alkaline Phosphatase 09/10/2014 65  40 - 150 U/L Final  . AST 09/10/2014 16  5 - 34 U/L Final  . ALT 09/10/2014 18  0 - 55 U/L Final  . Total Protein 09/10/2014 6.7  6.4 - 8.3 g/dL Final  . Albumin 09/10/2014 3.2* 3.5 - 5.0  g/dL Final  . Calcium 09/10/2014 9.0  8.4 - 10.4 mg/dL Final  . Anion Gap 09/10/2014 10  3 - 11 mEq/L Final  . EGFR 09/10/2014 >90  >90 ml/min/1.73 m2 Final   eGFR is calculated using the CKD-EPI Creatinine Equation (2009)  . Glucose 09/10/2014 Negative  Negative mg/dL Final  . Bilirubin (Urine) 09/10/2014 Negative  Negative Final  . Ketones 09/10/2014 Negative  Negative mg/dL Final  . Specific Gravity, Urine 09/10/2014 1.005  1.003 - 1.035 Final  . Blood 09/10/2014 Negative  Negative Final  . pH 09/10/2014 8.0  4.6 - 8.0 Final  . Protein 09/10/2014 Negative  Negative- <30 mg/dL Final  . Urobilinogen, UR 09/10/2014 0.2  0.2 - 1 mg/dL Final  . Nitrite 09/10/2014 Negative  Negative Final  . Leukocyte Esterase 09/10/2014 Negative  Negative Final  . RBC / HPF 09/10/2014 Negative  0 - 2 Final  . WBC, UA 09/10/2014 0-2  0 - 2 Final  . Bacteria, UA 09/10/2014 Trace  Negative- Trace Final  . Epithelial Cells 09/10/2014 Moderate  Negative- Few Final     RADIOGRAPHIC STUDIES: No results found.  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  Patient does report one episode of blowing her nose and producing some blood-tinged mucus.  At present, no obvious bleeding noted on exam.  Will continue to monitor closely.   Neutropenic fever Patient reports awakening in the middle the night with a temperature of 101.2.  Patient is complaining of some very mild nasal congestion; but no other new symptoms. Temperature while at the cancer Center today was 99.4. Patient states that she took Aleve at 2 PM today.    Labs today reveal a WBC of 1.0; and an ANC of 0.3.  Urinalysis with no obvious infection.  Urine culture and blood cultures 2 pending results.   On exam-very mild nasal congestion only.  Patient is experiencing fairly severe mucositis; with all oral mucosa red and edematous.  Patient also has multiple white lesions to her oromucosa.  All  oral lesions are very tender.  Fever could be secondary to new onset mild URI symptoms/viral illness; versus mucositis inflammatory response.  Patient will be direct admitted the the hospitalist program for diagnosis of neutropenic fever and mucositis.  Patient also appears fairly dehydrated today.  Brief history report was called to Dr. Algis Liming hospitalist prior to patient being transported to [place] bed per wheelchair via [place].     Myalgia Patient received her first Neulasta injection this past Saturday, 09/06/2014.  She presented to the cancer Center symptom management clinic on Monday, 09/08/2014 with complaint of myalgias to her bilateral lower extremities.  Most likely, some the myalgias was secondary to her Neulasta injection.  Patient states that her myalgias have slightly improved since that time.  Patient takes Ultram on a when necessary basis for her pain.   Mucositis due to chemotherapy Patient completed her first cycle of gemcitabine/docetaxel chemotherapy on 09/04/2014.  Patient has now developed some fairly severe chemotherapy therapy-induced mucositis.  On exam-entire oral mucosa with increased erythema and edema.  She also has multiple white lesions to her oral mucosa that are very tender.  Patient has already been prescribed Magic mouthwash with both nystatin and lidocaine in it; but patient states she's only using the Magic mouthwash typically once per day.  She also uses Biotene mouth rinse as well.   Endometrial stromal sarcoma Patient completed cycle 1, day 8 of his gemcitabine/docetaxel chemotherapy regimen on 09/04/2014.  She received her first Neulasta injection on 09/06/2014.  She is scheduled to return for cycle 2, day 1 of the same regimen on 09/18/2014.  Patient is also scheduled for labs and a follow-up visit with Dr. Marko Plume on 09/11/2014.  Patient knows to call in the interim with any new worries or concerns whatsoever.        Patient  stated understanding of all instructions; and was in agreement with this plan of care. The patient knows to call the clinic with any problems, questions or concerns.   Review/collaboration with Dr. Marko Plume regarding all aspects of patient's visit today.   Total time spent with patient was 40 minutes;  with greater than 75 percent of that time spent in face to face counseling regarding patient's symptoms,  and coordination of care and follow up.  Disclaimer: This note was dictated with voice recognition software. Similar sounding words can inadvertently be transcribed and may not be corrected upon review.   Drue Second, NP 09/10/2014

## 2014-09-10 NOTE — Telephone Encounter (Signed)
Called reporting she "could barely wake up today and still very tired.  "Bad reaction to neulasta injection.  The tylenol made the pain ebb and flow.  I hurt worse with tylenol than without.  Diarrhea yesterday.  My temperature at this time is 101.2.  I took imodium yesterday and no more diarrhea.  I feel terrible like I have a cold or something.  The stuff coming out of my nose is bloody, red.  I stayed out from work.   Will come in to symptom management clinic.  P.O.F. Generated.

## 2014-09-10 NOTE — Assessment & Plan Note (Signed)
Patient completed cycle 1, day 8 of his gemcitabine/docetaxel chemotherapy regimen on 09/04/2014.  She received her first Neulasta injection on 09/06/2014.  She is scheduled to return for cycle 2, day 1 of the same regimen on 09/18/2014.  Patient is also scheduled for labs and a follow-up visit with Dr. Marko Plume on 09/11/2014.  Patient knows to call in the interim with any new worries or concerns whatsoever.

## 2014-09-10 NOTE — H&P (Signed)
PCP: Tommy Medal, MD  Oncology [place]  Chief Complaint: Pain with eating and fever  HPI: Erin Santiago is a 59 y.o. female   has a past medical history of Abnormal uterine bleeding; Anemia; Fibroid; Heart murmur; and Cancer.   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 She have had her first infusion on on 09/04/2014 after her port has been placed. On  09/06/14, she received Neulasta on 04/02 as well. Patient developed chemotherapy related mucositis she has been using Biotine but got worse. She have had some health with Magic Mouth wash. She has not been eating or drinking much and feel dehydrated.  Patietn reports fever up to 101.2 at night. She reports mild sore throat. Denies nausea, vomiting. She have had 2-3 loose BM's. Patient reports stop using magic mouth wash because it made her sleepy but it did seem to help the pain. Patient reports not eating in the past 24 hours but has been drinking water.  Her ANC was noted to be down to 0.3 today and she was sent to [facility] for admission for neutropenic fever.   Hospitalist was called for admission for Neutropenic fever.   Review of Systems:    Pertinent positives include:  Fevers, chills, fatigue, mouth ulcers, diarrhea, Sore throat,   Constitutional:  No weight loss, night sweats, weight loss  HEENT:  No headaches, Difficulty swallowing,Tooth/dental problems, No sneezing, itching, ear ache, nasal congestion, post nasal drip,  Cardio-vascular:  No chest pain, Orthopnea, PND, anasarca, dizziness, palpitations.no Bilateral lower extremity swelling  GI:  No heartburn, indigestion, abdominal pain, nausea, vomiting,  change in bowel habits, loss of appetite, melena, blood in stool, hematemesis Resp:  no shortness of breath at rest. No dyspnea on exertion, No excess mucus, no productive cough, No non-productive cough, No coughing up of blood.No change in color of mucus.No  wheezing. Skin:  no rash or lesions. No jaundice GU:  no dysuria, change in color of urine, no urgency or frequency. No straining to urinate.  No flank pain.  Musculoskeletal:  No joint pain or no joint swelling. No decreased range of motion. No back pain.  Psych:  No change in mood or affect. No depression or anxiety. No memory loss.  Neuro: no localizing neurological complaints, no tingling, no weakness, no double vision, no gait abnormality, no slurred speech, no confusion  Otherwise ROS are negative except for above, 10 systems were reviewed  Past Medical History: Past Medical History  Diagnosis Date  . Abnormal uterine bleeding   . Anemia   . Fibroid   . Heart murmur     under 5 yrs of age  . Cancer     endometrial stromal sarcoma   Past Surgical History  Procedure Laterality Date  . Laparoscopic gastric sleeve resection    . Abdominal hysterectomy  07/2014    at [facility]     Medications: Prior to Admission medications   Medication Sig Start Date End Date Taking? Authorizing Provider  Alum & Mag Hydroxide-Simeth (MAGIC MOUTHWASH W/LIDOCAINE) SOLN QID prn for mouth/throat irriation- may swish swallow and or spit 09/06/14  Yes Chauncey Cruel, MD  dexamethasone (DECADRON) 4 MG tablet Take 2 tabs with food twice a day x 3 days beginning day prior to Taxotere(Second week of Chemo) 08/18/14  Yes Lennis Marion Downer, MD  ferrous sulfate 325 (65 FE) MG tablet Take 1 tablet (325 mg total) by mouth daily with breakfast. 07/05/14  Yes Robbie Lis, MD  hydrocortisone 2.5 % cream Apply 1 application topically as needed (itch/psoriasis.).  05/19/14  Yes Historical Provider, MD  meloxicam (MOBIC) 15 MG tablet Take 15 mg by mouth daily.  06/09/14  Yes Historical Provider, MD  ondansetron (ZOFRAN ODT) 8 MG disintegrating tablet Take 1 tablet (8 mg total) by mouth every 8 (eight) hours as needed for nausea or vomiting. 08/18/14  Yes Lennis Marion Downer, MD  PRESCRIPTION MEDICATION    Yes Historical  Provider, MD  docusate sodium (COLACE) 100 MG capsule Take 100 mg by mouth 2 (two) times daily.  07/17/14   Historical Provider, MD  lidocaine-prilocaine (EMLA) cream Apply to Porta-cath 1-2 hrs prior to access. Cover with [employer]. Patient not taking: Reported on 09/10/2014 08/18/14   Lennis Marion Downer, MD  LORazepam (ATIVAN) 1 MG tablet Place 1/2-1 tablet under the tongue or swallow every 6 hrs as needed for nausea. Will make you drowsy. Patient not taking: Reported on 09/01/2014 08/18/14   Gordy Levan, MD  Psyllium 28.3 % POWD Take 1 packet by mouth daily.    Historical Provider, MD  traMADol Veatrice Bourbon) 50 MG tablet May take 25-50 mg PO BID PRN. (1/2 to 1 whole tab) Patient not taking: Reported on 09/10/2014 09/08/14   Susanne Borders, NP    Allergies:  No Known Allergies  Social History:  Ambulatory  Independently  Lives at home alone,        reports that she has never smoked. She has never used smokeless tobacco. She reports that she does not drink alcohol or use illicit drugs.    Family History: family history includes Diabetes in her mother; Heart attack in her father; Hypertension in her mother; Other in her father; Thyroid disease in her father.    Physical Exam: Patient Vitals for the past 24 hrs:  BP Temp Temp src Pulse Resp SpO2 Height Weight  09/10/14 1754 136/64 mmHg 99.6 F (37.6 C) Oral 84 16 99 % 5' (1.524 m) 102.513 kg (226 lb)    1. General:  in No Acute distress 2. Psychological: Alert and  Oriented 3. Head/ENT:     Dry Mucous Membranes, mucosal ulcerations mildbucal mucosal white patches present                          Head Non traumatic, neck supple                          Normal   Dentition 4. SKIN:  decreased Skin turgor,  Skin clean Dry and intact no rash, slight redness around Port-A-Cath incision no drainage 5. Heart: Regular rate and rhythm no Murmur, Rub or gallop 6. Lungs:  no wheezes mild bibasilar crackles   7. Abdomen: Soft, non-tender, Non  distended 8. Lower extremities: no clubbing, cyanosis, or edema 9. Neurologically Grossly intact, moving all 4 extremities equally 10. MSK: Normal range of motion  body mass index is 44.14 kg/(m^2).   Labs on Admission:   Results for orders placed or performed in visit on 09/10/14 (from the past 24 hour(s))  Urinalysis with microscopic - [facility]     Status: None   Collection Time: 09/10/14  3:10 PM  Result Value Ref Range   Glucose Negative Negative mg/dL   Bilirubin (Urine) Negative Negative   Ketones Negative Negative mg/dL   Specific Gravity, Urine 1.005 1.003 - 1.035   Blood Negative Negative  pH 8.0 4.6 - 8.0   Protein Negative Negative- <30 mg/dL   Urobilinogen, UR 0.2 0.2 - 1 mg/dL   Nitrite Negative Negative   Leukocyte Esterase Negative Negative   RBC / HPF Negative 0 - 2   WBC, UA 0-2 0 - 2   Bacteria, UA Trace Negative- Trace   Epithelial Cells Moderate Negative- Few   Narrative   Performed At:  [facility].               [place], [place] [address]  CBC with Differential     Status: Abnormal   Collection Time: 09/10/14  3:12 PM  Result Value Ref Range   WBC 1.0 (L) 3.9 - 10.3 10e3/uL   NEUT# 0.3 (LL) 1.5 - 6.5 10e3/uL   HGB 11.1 (L) 11.6 - 15.9 g/dL   HCT 34.7 (L) 34.8 - 46.6 %   Platelets 75 (L) 145 - 400 10e3/uL   MCV 78.2 (L) 79.5 - 101.0 fL   MCH 25.0 (L) 25.1 - 34.0 pg   MCHC 32.0 31.5 - 36.0 g/dL   RBC 4.44 3.70 - 5.45 10e6/uL   RDW 16.8 (H) 11.2 - 14.5 %   lymph# 0.5 (L) 0.9 - 3.3 10e3/uL   MONO# 0.2 0.1 - 0.9 10e3/uL   Eosinophils Absolute 0.1 0.0 - 0.5 10e3/uL   Basophils Absolute 0.0 0.0 - 0.1 10e3/uL   NEUT% 24.6 (L) 38.4 - 76.8 %   LYMPH% 50.0 (H) 14.0 - 49.7 %   MONO% 17.6 (H) 0.0 - 14.0 %   EOS% 7.8 (H) 0.0 - 7.0 %   BASO% 0.0 0.0 - 2.0 %   Narrative   Performed At:  [facility]               [address]               [place], [place] [address]  Comprehensive metabolic panel (Cmet) - [facility]      Status: Abnormal   Collection Time: 09/10/14  3:14 PM  Result Value Ref Range   Sodium 136 136 - 145 mEq/L   Potassium 4.1 3.5 - 5.1 mEq/L   Chloride 101 98 - 109 mEq/L   CO2 25 22 - 29 mEq/L   Glucose 101 70 - 140 mg/dl   BUN 12.1 7.0 - 26.0 mg/dL   Creatinine 0.7 0.6 - 1.1 mg/dL   Total Bilirubin 1.25 (H) 0.20 - 1.20 mg/dL   Alkaline Phosphatase 65 40 - 150 U/L   AST 16 5 - 34 U/L   ALT 18 0 - 55 U/L   Total Protein 6.7 6.4 - 8.3 g/dL   Albumin 3.2 (L) 3.5 - 5.0 g/dL   Calcium 9.0 8.4 - 10.4 mg/dL   Anion Gap 10 3 - 11 mEq/L   EGFR >90 >90 ml/min/1.73 m2   Narrative   Performed At:  [facility]               [address]               [place], [place] [address]    UA ordered  No results found for: HGBA1C  Estimated Creatinine Clearance: 81.6 mL/min (by C-G formula based on Cr of 0.7).  BNP (last 3 results) No results for input(s): PROBNP in the last 8760 hours.  Other results:  I have  pearsonaly reviewed this: ECG not obtained  Filed Weights   09/10/14 1754  Weight: 102.513 kg (226 lb)    Cultures: No results found for: SDES, SPECREQUEST, CULT, REPTSTATUS   Radiological Exams on Admission: No results found.  Chart has been reviewed  Assessment/Plan  59 yo F with hx of Endometrial cancer here with dehydration chemo induced mucositis and neutropenic fever.   Present on Admission:  . Endometrial stromal sarcoma - as per oncology. Dr. Sheralyn Boatman is aware of the patient will come to see in the morning. Patient is status post Neulasta. We'll continue to monitor white blood cell count and neutrophils neutropenic percussions  . Mucositis due to chemotherapy - given the fever reveals started on fluconazole per pharmacy to treat for possible thrush, continue Magic well for some biotin supportive management otherwise  . Neutropenic fever - obtain blood cultures, chest x-ray, treated broad-spectrum antibiotic Zosyn and vancomycin mild redness around Port-A-Cath  incision unlikely to be the source . Thrombocytopenia - most likely chemotherapy induced we'll follow this point no indication for transfusion Dehydration - administer IV fluids  Prophylaxis: SCD    CODE STATUS:  FULL CODE   Other plan as per orders.  I have spent a total of 55 min on this admission  Anae Hams 09/10/2014, 8:06 PM  [facility]  Pager [phone]   after 2 AM please page floor coverage PA If 7AM-7PM, please contact the day team taking care of the patient  [email or web address]  Password [email or web address]

## 2014-09-10 NOTE — Assessment & Plan Note (Signed)
Patient reports awakening in the middle the night with a temperature of 101.2.  Patient is complaining of some very mild nasal congestion; but no other new symptoms. Temperature while at the cancer Center today was 99.4. Patient states that she took Aleve at 2 PM today.    Labs today reveal a WBC of 1.0; and an ANC of 0.3.  Urinalysis with no obvious infection.  Urine culture and blood cultures 2 pending results.   On exam-very mild nasal congestion only.  Patient is experiencing fairly severe mucositis; with all oral mucosa red and edematous.  Patient also has multiple white lesions to her oromucosa.  All oral lesions are very tender.  Fever could be secondary to new onset mild URI symptoms/viral illness; versus mucositis inflammatory response.  Patient will be direct admitted the the hospitalist program for diagnosis of neutropenic fever and mucositis.  Patient also appears fairly dehydrated today.  Brief history report was called to Dr. Algis Liming hospitalist prior to patient being transported to [place] bed per wheelchair via [place].

## 2014-09-10 NOTE — Assessment & Plan Note (Signed)
Patient completed her first cycle of gemcitabine/docetaxel chemotherapy on 09/04/2014.  Patient has now developed some fairly severe chemotherapy therapy-induced mucositis.  On exam-entire oral mucosa with increased erythema and edema.  She also has multiple white lesions to her oral mucosa that are very tender.  Patient has already been prescribed Magic mouthwash with both nystatin and lidocaine in it; but patient states she's only using the Magic mouthwash typically once per day.  She also uses Biotene mouth rinse as well.

## 2014-09-10 NOTE — Assessment & Plan Note (Signed)
Patient received her first Neulasta injection this past Saturday, 09/06/2014.  She presented to the cancer Center symptom management clinic on Monday, 09/08/2014 with complaint of myalgias to her bilateral lower extremities.  Most likely, some the myalgias was secondary to her Neulasta injection.  Patient states that her myalgias have slightly improved since that time.  Patient takes Ultram on a when necessary basis for her pain.

## 2014-09-10 NOTE — H&P (Signed)
Received called from [place] to admit Patient Erin Santiago to the hospital due to severe mucositis/thrush (with impaired ability for hydration) and also with neutropenic Fever. Patient with hx of endometrial stromal sarcoma, s/p hysterectomy and just finished first cycle of chemotherapy on 09/04/14. Of Note, she had Lunesta shot given on 09/06/14. Patient presented with an [place] of o.3 and temp of 101.4. Patient also [place] 99357 and Hgb 11.1.Marland KitchenMarland Kitchen Patient accepted for treatment of neutropenic fever. Will be admitted to med-surg, oncology floor ([facility]). Oncology (Dr. Marko Plume will consult on her).   Barton Dubois [phone]

## 2014-09-10 NOTE — Assessment & Plan Note (Signed)
Chemotherapy-induced thrombocytopenia; with platelets down to 75.  Patient does report one episode of blowing her nose and producing some blood-tinged mucus.  At present, no obvious bleeding noted on exam.  Will continue to monitor closely.

## 2014-09-10 NOTE — Progress Notes (Signed)
ANTIBIOTIC CONSULT NOTE - INITIAL  Pharmacy Consult for Vancomycin, Fluconazole Indication: Febrile Neutropenia  No Known Allergies  Patient Measurements: Height: 5' (152.4 cm) Weight: 226 lb (102.513 kg) IBW/kg (Calculated) : 45.5  Vital Signs: Temp: 100 F (37.8 C) (04/06 2020) Temp Source: Oral (04/06 2020) BP: 127/58 mmHg (04/06 2020) Pulse Rate: 90 (04/06 2020) Intake/Output from previous day:    Labs:  Recent Labs  09/10/14 1512 09/10/14 1514  WBC 1.0*  --   HGB 11.1*  --   PLT 75*  --   CREATININE  --  0.7   Estimated Creatinine Clearance: 81.6 mL/min (by C-G formula based on Cr of 0.7). No results for input(s): VANCOTROUGH, VANCOPEAK, VANCORANDOM, GENTTROUGH, GENTPEAK, GENTRANDOM, TOBRATROUGH, TOBRAPEAK, TOBRARND, AMIKACINPEAK, AMIKACINTROU, AMIKACIN in the last 72 hours.   Microbiology: No results found for this or any previous visit (from the past 720 hour(s)).  Medical History: Past Medical History  Diagnosis Date  . Abnormal uterine bleeding   . Anemia   . Fibroid   . Heart murmur     under 5 yrs of age  . Cancer     endometrial stromal sarcoma    Medications:  Scheduled:  . piperacillin-tazobactam (ZOSYN)  IV  3.375 g Intravenous 3 times per day   Infusions:    Assessment: 89 yoF admitted 4/6 with neutropenic fever and thrush.  PMH includes endometrial stromal sarcoma, s/p chemo given most recently on 3/31.  Zosyn per MD dosing.  Pharmacy is consulted to dose Vancomycin and Fluconazole.  4/6 >> Zosyn >> 4/6 >> Vanc >> 4/6 >> Fluconazole >>     Today, 09/10/2014  Tmax 100  WBC: 1, ANC 0.3  Renal: SCr 0.7, CrCl ~ 81 ml/min CG  Blood and urine cultures sent  Goal of Therapy:  Vancomycin trough level 15-20 mcg/ml Appropriate abx dosing, eradication of infection.  Plan:   Fluconazole 200mg  IV q24h  Vancomycin 2g IV once, then 750mg  IV q12h.  Measure Vanc trough at steady state.  Follow up renal fxn, culture results, and  clinical course.   Gretta Arab PharmD, BCPS Pager [phone] 09/10/2014 8:57 PM

## 2014-09-11 ENCOUNTER — Telehealth: Payer: Self-pay | Admitting: Oncology

## 2014-09-11 ENCOUNTER — Other Ambulatory Visit: Payer: Federal, State, Local not specified - PPO

## 2014-09-11 ENCOUNTER — Ambulatory Visit: Payer: Federal, State, Local not specified - PPO | Admitting: Oncology

## 2014-09-11 DIAGNOSIS — C541 Malignant neoplasm of endometrium: Secondary | ICD-10-CM

## 2014-09-11 DIAGNOSIS — D500 Iron deficiency anemia secondary to blood loss (chronic): Secondary | ICD-10-CM

## 2014-09-11 DIAGNOSIS — B370 Candidal stomatitis: Secondary | ICD-10-CM

## 2014-09-11 DIAGNOSIS — R5081 Fever presenting with conditions classified elsewhere: Secondary | ICD-10-CM

## 2014-09-11 DIAGNOSIS — D6959 Other secondary thrombocytopenia: Secondary | ICD-10-CM | POA: Insufficient documentation

## 2014-09-11 DIAGNOSIS — D701 Agranulocytosis secondary to cancer chemotherapy: Secondary | ICD-10-CM

## 2014-09-11 DIAGNOSIS — T451X5A Adverse effect of antineoplastic and immunosuppressive drugs, initial encounter: Secondary | ICD-10-CM

## 2014-09-11 DIAGNOSIS — D6481 Anemia due to antineoplastic chemotherapy: Secondary | ICD-10-CM

## 2014-09-11 LAB — DIFFERENTIAL
BASOS PCT: 1 % (ref 0–1)
Basophils Absolute: 0 10*3/uL (ref 0.0–0.1)
EOS ABS: 0.1 10*3/uL (ref 0.0–0.7)
Eosinophils Relative: 3 % (ref 0–5)
LYMPHS PCT: 28 % (ref 12–46)
Lymphs Abs: 0.6 10*3/uL — ABNORMAL LOW (ref 0.7–4.0)
MONO ABS: 0.5 10*3/uL (ref 0.1–1.0)
Monocytes Relative: 22 % — ABNORMAL HIGH (ref 3–12)
NEUTROS ABS: 1 10*3/uL — AB (ref 1.7–7.7)
Neutrophils Relative %: 46 % (ref 43–77)

## 2014-09-11 LAB — URINALYSIS, ROUTINE W REFLEX MICROSCOPIC
BILIRUBIN URINE: NEGATIVE
Glucose, UA: NEGATIVE mg/dL
Hgb urine dipstick: NEGATIVE
Ketones, ur: 15 mg/dL — AB
Leukocytes, UA: NEGATIVE
Nitrite: NEGATIVE
PROTEIN: NEGATIVE mg/dL
Specific Gravity, Urine: 1.02 (ref 1.005–1.030)
Urobilinogen, UA: 0.2 mg/dL (ref 0.0–1.0)
pH: 5 (ref 5.0–8.0)

## 2014-09-11 LAB — CBC
HCT: 29.8 % — ABNORMAL LOW (ref 36.0–46.0)
HEMOGLOBIN: 9.2 g/dL — AB (ref 12.0–15.0)
MCH: 24.4 pg — ABNORMAL LOW (ref 26.0–34.0)
MCHC: 30.9 g/dL (ref 30.0–36.0)
MCV: 79 fL (ref 78.0–100.0)
PLATELETS: 69 10*3/uL — AB (ref 150–400)
RBC: 3.77 MIL/uL — AB (ref 3.87–5.11)
RDW: 16.8 % — ABNORMAL HIGH (ref 11.5–15.5)
WBC: 2.2 10*3/uL — ABNORMAL LOW (ref 4.0–10.5)

## 2014-09-11 LAB — COMPREHENSIVE METABOLIC PANEL
ALBUMIN: 2.8 g/dL — AB (ref 3.5–5.2)
ALK PHOS: 51 U/L (ref 39–117)
ALT: 16 U/L (ref 0–35)
AST: 18 U/L (ref 0–37)
Anion gap: 9 (ref 5–15)
BILIRUBIN TOTAL: 1.2 mg/dL (ref 0.3–1.2)
BUN: 9 mg/dL (ref 6–23)
CHLORIDE: 103 mmol/L (ref 96–112)
CO2: 24 mmol/L (ref 19–32)
Calcium: 7.8 mg/dL — ABNORMAL LOW (ref 8.4–10.5)
Creatinine, Ser: 0.68 mg/dL (ref 0.50–1.10)
GFR calc non Af Amer: >90 mL/min (ref >90)
Glucose, Bld: 88 mg/dL (ref 70–99)
POTASSIUM: 3.7 mmol/L (ref 3.5–5.1)
Sodium: 136 mmol/L (ref 135–145)
Total Protein: 5.8 g/dL — ABNORMAL LOW (ref 6.0–8.3)

## 2014-09-11 LAB — PHOSPHORUS: PHOSPHORUS: 4 mg/dL (ref 2.3–4.6)

## 2014-09-11 LAB — TSH: TSH: 0.796 u[IU]/mL (ref 0.350–4.500)

## 2014-09-11 LAB — MAGNESIUM: Magnesium: 1.8 mg/dL (ref 1.5–2.5)

## 2014-09-11 LAB — URINE CULTURE

## 2014-09-11 MED ORDER — ENSURE ENLIVE PO LIQD
237.0000 mL | Freq: Two times a day (BID) | ORAL | Status: DC
  Administered 2014-09-11 – 2014-09-12 (×2): 237 mL via ORAL

## 2014-09-11 MED ORDER — SALINE SPRAY 0.65 % NA SOLN
1.0000 | NASAL | Status: DC | PRN
  Administered 2014-09-11: 1 via NASAL
  Filled 2014-09-11 (×2): qty 44

## 2014-09-11 NOTE — Progress Notes (Signed)
[facility] PROGRESS NOTE  Erin Santiago YIR:[id number] DOB: [birth year] DOA: 09/10/2014  PCP: Tommy Medal, MD  Brief HPI: 59 year old Caucasian female with a past medical history of high grade endometrial stromal sarcoma who underwent surgery February. Presented to the hospital with complaints of fever. She was found to be neutropenic. She was admitted to the hospital for further management.  Past medical history:  Past Medical History  Diagnosis Date  . Abnormal uterine bleeding   . Anemia   . Fibroid   . Heart murmur     under 5 yrs of age  . Cancer     endometrial stromal sarcoma    Consultants: Oncology  Procedures: None  Antibiotics: Vancomycin, Zosyn 4/7  Subjective: Patient feels better this morning. Denies any cough. No chest pain or shortness of breath. No nausea, vomiting. No diarrhea.  Objective: Vital Signs  Filed Vitals:   09/10/14 1754 09/10/14 2020 09/11/14 0010 09/11/14 0528  BP: 136/64 127/58 121/60 122/51  Pulse: 84 90 88 82  Temp: 99.6 F (37.6 C) 100 F (37.8 C) 99.8 F (37.7 C) 98.3 F (36.8 C)  TempSrc: Oral Oral Oral Oral  Resp: 16 16 16 16   Height: 5' (1.524 m)     Weight: 102.513 kg (226 lb)     SpO2: 99% 93% 95% 95%    Intake/Output Summary (Last 24 hours) at 09/11/14 0851 Last data filed at 09/11/14 0538  Gross per 24 hour  Intake 1868.33 ml  Output    575 ml  Net 1293.33 ml   Filed Weights   09/10/14 1754  Weight: 102.513 kg (226 lb)    General appearance: alert, cooperative, appears stated age and no distress Head: Normocephalic, without obvious abnormality, atraumatic. Few oral lesions. Resp: clear to auscultation bilaterally Cardio: regular rate and rhythm, S1, S2 normal, no murmur, click, rub or gallop GI: soft, non-tender; bowel sounds normal; no masses,  no organomegaly Extremities: extremities normal, atraumatic, no cyanosis or edema Neurologic: Alert and oriented 3. No focal deficit.  Lab  Results:  Basic Metabolic Panel:  Recent Labs Lab 09/04/14 1138 09/10/14 1514 09/11/14 0530  NA 140 136 136  K 4.3 4.1 3.7  CL  --   --  103  CO2 22 25 24   GLUCOSE 115 101 88  BUN 22.1 12.1 9  CREATININE 0.7 0.7 0.68  CALCIUM 9.3 9.0 7.8*  MG  --   --  1.8  PHOS  --   --  4.0   Liver Function Tests:  Recent Labs Lab 09/04/14 1138 09/10/14 1514 09/11/14 0530  AST 18 16 18   ALT 22 18 16   ALKPHOS 74 65 51  BILITOT 0.63 1.25* 1.2  PROT 7.4 6.7 5.8*  ALBUMIN 3.7 3.2* 2.8*   CBC:  Recent Labs Lab 09/04/14 1138 09/10/14 1512 09/11/14 0530  WBC 8.9 1.0* 2.2*  NEUTROABS 7.9* 0.3* 1.0*  HGB 11.6 11.1* 9.2*  HCT 37.0 34.7* 29.8*  MCV 78.4* 78.2* 79.0  PLT 240 75* 69*    Studies/Results: X-ray Chest Pa And Lateral  09/10/2014   CLINICAL DATA:  Neutropenic fever  EXAM: CHEST  2 VIEW  COMPARISON:  None.  FINDINGS: Linear opacities fairly symmetrically in the lower lungs, most consistent with atelectasis. No definitive cardiomegaly. Negative aortic and hilar contours. A right IJ porta catheter is in good position, tip at the distal SVC.  IMPRESSION: Bibasilar atelectasis.  No convincing pneumonia.   Electronically Signed   By: Monte Fantasia M.D.   On: 09/10/2014  21:16    Medications:  Scheduled: . ferrous sulfate  325 mg Oral Q breakfast  . fluconazole (DIFLUCAN) IV  200 mg Intravenous Q24H  . magic mouthwash w/lidocaine  5 mL Oral TID  . meloxicam  15 mg Oral Daily  . piperacillin-tazobactam (ZOSYN)  IV  3.375 g Intravenous 3 times per day  . vancomycin  750 mg Intravenous Q12H   Continuous:  VZS:[id number] **OR** acetaminophen, antiseptic oral rinse, ondansetron **OR** ondansetron (ZOFRAN) IV, sodium chloride, traMADol  Assessment/Plan:  Active Problems:   Endometrial stromal sarcoma   Mucositis due to chemotherapy   Neutropenic fever   Thrombocytopenia   Dehydration    Neutropenic fever  Cell counts are improving. No clear source for infection.  Blood cultures are pending. Fever appears to be subsiding. Continue broad-spectrum antibiotics for now.   Endometrial stromal sarcoma As per oncology. Dr. Sheralyn Boatman is following. Patient is supposed to start her second cycle of chemotherapy on. Patient is status post Neulasta.   Mucositis due to chemotherapy Continue Diflucan. Along with Magic mouthwash.  Thrombocytopenia Most likely chemotherapy induced. She did have some epistaxis this morning when she was blowing her nose. She was told to avoid doing so. No clear indication for platelet transfusion.   DVT Prophylaxis: SCDs    Code Status: Full code  Family Communication: Discussed with the patient  Disposition Plan: Not ready for discharge.    LOS: 1 day   [facility] Pager [phone] 09/11/2014, 8:51 AM  If 7PM-7AM, please contact night-coverage at [email or web address], password [email or web address]

## 2014-09-11 NOTE — Telephone Encounter (Signed)
Per kristin please cancel todays appointments as she is in the hospital

## 2014-09-11 NOTE — Progress Notes (Signed)
September 11, 2014, 7:49 AM  Hospital day 2 Antibiotics: zosyn, vanc,diflucan all begun 09-10-14 Chemotherapy: day 1 cycle 1 gemzar taxotere on 08-28-14, day 8 cycle 1 on 09-04-14 and neulasta on 09-06-14   Outpatient physicians Everitt Amber, Tommy Medal, MD (PCP); [name]; [name], [name]. L.Mahathi Pokorney  Discussed with oncology NP during [facility] symptom management visit on 09-10-14. EMR reviewed, patient seen and examined now.   Appreciate care from hospitalist team.  Subjective: Feeling a little better today, did sleep last PM, tolerating up to BR this morning. Mouth and throat not as sore, able to drink fluids and ate pancake, little po including fluids for few days PTA. BRB and some clots from nose when she blows - discussed patting nose only/ not blowing with thrombocytopenia. No other bleeding. No shaking chills. Last BM was 09-09-14, diarrhea then and took imodium, perirectal area not uncomfortable. No nausea. Neulasta aches were mostly in lower legs, have resolved. Denies SOB or cough. No bladder symptoms. PAC functioning well, not sore.   PAC in No flu vaccine   ONCOLOGIC HISTORY Patient had been menopausal since age 66 until she had what seemed to her to be a menstrual period in Sept 2015, with large blood clot at completion of bleeding stopped then. She continued with lesser bleeding over next several months until she was seen in 06-2014 by [name]. Hemoglobin was 12.7 on 07-04-14. CT CAP 06-25-14 had uterus 16.4 x 13.5 x 14.4 cm with marked expansion of endometrial canal by heterogeneous mass, bilateral pelvic sidewall adenopathy, iliac adenopathy with no definite retroperitoneal or mesenteric adenopathy, no ascites, no liver mets. Attempted endometrial sampling 06-26-14 and 07-03-14 was nondiagnostic; around that time the patient was passing clots and using one large maxipad hourly. She was seen by [name] 07-05-14, with uterine fundus palpable above umbilicus. She had  surgery by [name] at [facility] on 07-14-14, which was exploratory laparotomy with TAH BSO, bilateral total pelvic lymphadenectomy and sampling of aortic and renal nodes. Pathology [facility] [id number]) found high grade endometrial stromal sarcoma with primary 18 cm, 8/45 nodes involved including 2 right pelvic and 6 left pelvic nodes; IHC for ER PR was to be done at [facility]. Surgery was uncomplicated and she was DC home on ~ POD #3 with lovenox x 28 days. Case was presented at [facility] multidisciplinary conference 07-23-14, with recommendation for PET CT to evaluate inguinal and portahepatis nodes, and to consider adjuvant gemzar taxotere and possibly follow with 4 cycles of adriamycin, then to consider whole pelvic RT. She saw [name] for post op follow up on 07-28-14, with recommendation for gemzar taxotere and adriamycin, whole pelvic RT after chemo and consideration of Megace maintenance after chemo. PET 08-01-14 had some uptake in aortocaval and left paraaortic regions. SHe had day 1 cycle 1 gemzar taxotere on 08-28-14, day 8 cycle 1 on 09-04-14 and neulasta on 09-06-14. Admitted with neutropenic fever on day 14 cycle 1.    Objective: Vital signs in last 24 hours: Blood pressure 122/51, pulse 82, temperature 98.3 F (36.8 C), temperature source Oral, resp. rate 16, height 5' (1.524 m), weight 226 lb (102.513 kg), last menstrual period 02/04/2014, SpO2 95 %.   Intake/Output from previous day: 04/06 0701 - 04/07 0700 In: 1868.3 [P.O.:360; I.V.:908.3; IV Piggyback:600] Out: 575 [Urine:575] Intake/Output this shift:    Physical exam: awake, alert, fully oriented and appropriate, looks mildly ill sitting in bed. Lots of bloody kleenex at bedside, no frank epistaxis now. Respirations  not labored RA. Partial alopecia which is chronic. PERRL, not icteric. Oral mucosa moist, thrush mostly on buccal mucosa now, no ulcerations. Neck supple without JVD. Lungs clear to A and P. Heart RRR no gallop. PAC infusing without  difficulty, insertion site not quite healed, no significant erythema and no tenderness. Resolving ecchymosis right forearm from IV access with PAC placement, no erythema or heat. Abdomen obese, normally active BS, soft, not tender. LE with PAS on, no swelling or tenderness. Moves all extremities easily, speech fluent and appropriate, no focal deficits. Psych appropriate mood and affect  Lab Results:  Recent Labs  09/10/14 1512 09/11/14 0530  WBC 1.0* 2.2*  HGB 11.1* 9.2*  HCT 34.7* 29.8*  PLT 75* 69*  differential in process from blood this AM  BMET  Recent Labs  09/10/14 1514 09/11/14 0530  NA 136 136  K 4.1 3.7  CL  --  103  CO2 25 24  GLUCOSE 101 88  BUN 12.1 9  CREATININE 0.7 0.68  CALCIUM 9.0 7.8*   Remainder of CMET with T prot 5.8, alb 2.8. LFTs all fine  UA on admission not remarkable. Urine and blood cultures sent from [facility] PTA and pending.  Studies/Results: X-ray Chest Pa And Lateral  09/10/2014   CLINICAL DATA:  Neutropenic fever  EXAM: CHEST  2 VIEW  COMPARISON:  None.  FINDINGS: Linear opacities fairly symmetrically in the lower lungs, most consistent with atelectasis. No definitive cardiomegaly. Negative aortic and hilar contours. A right IJ porta catheter is in good position, tip at the distal SVC.  IMPRESSION: Bibasilar atelectasis.  No convincing pneumonia.   Electronically Signed   By: Monte Fantasia M.D.   On: 09/10/2014 21:16     Assessment/Plan: 1.neutropenic fever: counts likely at nadir from first cycle gemzar taxotere, despite neulasta. [place] pending today, with WBC up slightly from admission.  Continue neutropenic precautions until [place] >1.0. Continue broad spectrum antibiotics and diflucan. Follow cultures pending. 2. Chemo thrombocytopenia: no ongoing epistaxis now. Educated on patting nose only, will put saline nose spray at bedside to use frequently while awake. Transfuse platelets if significant bleeding 3.anemia from blood loss with gyn  bleeding and surgery previously, and chemo. Likely some dehydration on admission. Continue oral iron and follow 4.high grade endometrial stromal sarcoma: presented with 4-5 months postmenopausal bleeding in 06-2014, surgery at [facility] 07-14-14, PET with some uptake aortocaval and paraaortic nodes. Plan is gemzar taxotere then adriamycin, pelvic RT and possible megace maintenance. 5.oral thrush: improving already with diflucan, continue. Used steroids around day 8 taxotere 6.PAC in 7.morbid obesity. Post gastric banding  8.overdue dental care without acute symptoms 9.degenerative arthritis knees post injections by orthopedics prior to start of chemo, improved. 10.did not have flu vaccine. Symptoms do not suggest influenza 11.hx nonmelanoma skin cancer   WIll follow with you - thank you!  Please page me between my rounds if I can be of help   Pager [phone]  Elyce Zollinger P

## 2014-09-11 NOTE — Progress Notes (Signed)
PT Cancellation Note  Patient Details Name: Erin Santiago MRN: [id number] DOB: August 05, 1955   Cancelled Treatment:    Reason Eval/Treat Not Completed: Other (comment) (patient just got back to bed.)   [name] 09/11/2014, 5:02 PM

## 2014-09-11 NOTE — Progress Notes (Signed)
INITIAL NUTRITION ASSESSMENT  DOCUMENTATION CODES Per approved criteria  -Morbid Obesity   INTERVENTION: Provide Ensure Enlive po BID, each supplement provides 350 kcal and 20 grams of protein Provided "Muscositis Tips", "Sore Mouth" and "Diarrhea" nutrition therapy handouts. Encourage PO intake  NUTRITION DIAGNOSIS: Inadequate oral intake related to mucositis as evidenced by poor PO intake PTA.   Goal: Pt to meet >/= 90% of their estimated nutrition needs   Monitor:  PO intake, weight, labs, I/O's  Reason for Assessment: Consult for diet education  Admitting Dx: Pain with eating and fever  ASSESSMENT: 59 y.o. female   has a past medical history of Abnormal uterine bleeding; Anemia; Fibroid; Heart murmur; and Cancer. 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 PMHx of gastric sleeve.  Pt reports no changes in appetite as she feels hungry. She has problems tolerating foods in her mouth d/t pain (mucositis). Pt recently had episodes of diarrhea this past week. She continues to have loose stools. Pt has been ordered a soft diet.  Pt states she is tolerating liquids the best. Pt is willing to try Ensure supplements. RD to order. Pt is being provided Magic Mouthwash, states this helps with her mouth pain.  Provided pt with "Mucositis Tips" and "Sore Mouth" nutrition handouts. Also provided, "Diarrhea" nutrition therapy handout as well. Provided examples of soft foods for pt to try and encouraged the consumption of nutritional supplements during hospitalization and after discharge.   Nutrition focused physical exam shows no sign of depletion of muscle mass or body fat.  Labs reviewed: WNL   Height: Ht Readings from Last 1 Encounters:  09/10/14 5' (1.524 m)    Weight: Wt Readings from Last 1 Encounters:  09/10/14 226 lb (102.513 kg)    Ideal Body Weight: 100 lb  % Ideal Body Weight: 226%  Wt Readings from Last 10 Encounters:  09/10/14 226  lb (102.513 kg)  09/10/14 226 lb 14.4 oz (102.921 kg)  09/08/14 231 lb 8 oz (105.008 kg)  09/01/14 230 lb (104.327 kg)  08/18/14 227 lb 14.4 oz (103.375 kg)  07/28/14 227 lb 3.2 oz (103.057 kg)  07/23/14 228 lb 9.6 oz (103.692 kg)  07/01/14 230 lb 6.4 oz (104.509 kg)  06/26/14 228 lb (103.42 kg)  06/19/14 230 lb (104.327 kg)    Usual Body Weight: 230 lb  % Usual Body Weight: 98%  BMI:  Body mass index is 44.14 kg/(m^2).  Estimated Nutritional Needs: Kcal: 2000-2200 Protein: 90-100g Fluid: 2L/day  Skin: intact  Diet Order: DIET SOFT Room service appropriate?: Yes; Fluid consistency:: Thin  EDUCATION NEEDS: -Education needs addressed   Intake/Output Summary (Last 24 hours) at 09/11/14 1515 Last data filed at 09/11/14 1200  Gross per 24 hour  Intake 2608.33 ml  Output    575 ml  Net 2033.33 ml    Last BM: 4/5  Labs:   Recent Labs Lab 09/10/14 1514 09/11/14 0530  NA 136 136  K 4.1 3.7  CL  --  103  CO2 25 24  BUN 12.1 9  CREATININE 0.7 0.68  CALCIUM 9.0 7.8*  MG  --  1.8  PHOS  --  4.0  GLUCOSE 101 88    CBG (last 3)  No results for input(s): GLUCAP in the last 72 hours.  Scheduled Meds: . ferrous sulfate  325 mg Oral Q breakfast  . fluconazole (DIFLUCAN) IV  200 mg Intravenous Q24H  . magic mouthwash w/lidocaine  5 mL Oral TID  .  meloxicam  15 mg Oral Daily  . piperacillin-tazobactam (ZOSYN)  IV  3.375 g Intravenous 3 times per day  . vancomycin  750 mg Intravenous Q12H    Continuous Infusions:   Past Medical History  Diagnosis Date  . Abnormal uterine bleeding   . Anemia   . Fibroid   . Heart murmur     under 5 yrs of age  . Cancer     endometrial stromal sarcoma    Past Surgical History  Procedure Laterality Date  . Laparoscopic gastric sleeve resection    . Abdominal hysterectomy  07/2014    at [facility], [place], [place], LDN Pager: [phone] After Hours Pager: [phone]

## 2014-09-11 NOTE — Evaluation (Signed)
Occupational Therapy Evaluation Patient Details Name: Erin Santiago MRN: [id number] DOB: 07/28/1955 Today's Date: 09/11/2014    History of Present Illness 59 year old Caucasian female with a past medical history of high grade endometrial stromal sarcoma who underwent surgery February. Presented to the hospital with complaints of fever. She was found to be neutropenic. She was admitted to the hospital for further management.   Clinical Impression   Patient evaluated by Occupational Therapy with no further acute OT needs identified. All education has been completed and the patient has no further questions. Pt is modified independent with ADLs.  She feels she is at baseline.  Encouraged her to walk with nursing and to perform ADLs while in hospital to prevent deconditioning  See below for any follow-up Occupational Therapy or equipment needs. OT is signing off. Thank you for this referral.      Follow Up Recommendations  No OT follow up    Equipment Recommendations  None recommended by OT    Recommendations for Other Services       Precautions / Restrictions Precautions Precautions: None Restrictions Weight Bearing Restrictions: No      Mobility Bed Mobility Overal bed mobility: Independent                Transfers Overall transfer level: Modified independent                    Balance Overall balance assessment: No apparent balance deficits (not formally assessed)                                          ADL Overall ADL's : At baseline                                             Vision     Perception     Praxis      Pertinent Vitals/Pain Pain Assessment: No/denies pain     Hand Dominance Right   Extremity/Trunk Assessment Upper Extremity Assessment Upper Extremity Assessment: Overall WFL for tasks assessed   Lower Extremity Assessment Lower Extremity Assessment: Overall WFL for tasks assessed    Cervical / Trunk Assessment Cervical / Trunk Assessment: Normal   Communication Communication Communication: No difficulties   Cognition Arousal/Alertness: Awake/alert Behavior During Therapy: WFL for tasks assessed/performed Overall Cognitive Status: Within Functional Limits for tasks assessed                     General Comments       Exercises       Shoulder Instructions      Home Living Family/patient expects to be discharged to:: Private residence Living Arrangements: Alone Available Help at Discharge: Friend(s);Available PRN/intermittently Type of Home: Mobile home Home Access: Ramped entrance     Home Layout: One level     Bathroom Shower/Tub: Occupational psychologist: Standard     Home Equipment: Shower seat          Prior Functioning/Environment Level of Independence: Independent        Comments: Pt drives, performs all IADLs.  Works full time for [employer], but is out on leave due to chemo. 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     OT Diagnosis:  OT Problem List:     OT Treatment/Interventions:      OT Goals(Current goals can be found in the care plan section) Acute Rehab OT Goals OT Goal Formulation: All assessment and education complete, DC therapy  OT Frequency:     Barriers to D/C:            Co-evaluation              End of Session Nurse Communication: Mobility status  Activity Tolerance: Patient tolerated treatment well Patient left: in chair;with call bell/phone within reach   Time: 1315-1325 OT Time Calculation (min): 10 min Charges:  OT General Charges $OT Visit: 1 Procedure OT Evaluation $Initial OT Evaluation Tier I: 1 Procedure G-Codes:    Jarred Purtee M 2014-09-20, 1:32 PM

## 2014-09-12 ENCOUNTER — Other Ambulatory Visit: Payer: Self-pay | Admitting: Oncology

## 2014-09-12 ENCOUNTER — Telehealth: Payer: Self-pay | Admitting: Oncology

## 2014-09-12 DIAGNOSIS — C541 Malignant neoplasm of endometrium: Secondary | ICD-10-CM

## 2014-09-12 LAB — CBC WITH DIFFERENTIAL/PLATELET
Basophils Absolute: 0 10*3/uL (ref 0.0–0.1)
Basophils Relative: 0 % (ref 0–1)
EOS PCT: 2 % (ref 0–5)
Eosinophils Absolute: 0.1 10*3/uL (ref 0.0–0.7)
HCT: 30.7 % — ABNORMAL LOW (ref 36.0–46.0)
Hemoglobin: 9.5 g/dL — ABNORMAL LOW (ref 12.0–15.0)
Lymphocytes Relative: 15 % (ref 12–46)
Lymphs Abs: 1.1 10*3/uL (ref 0.7–4.0)
MCH: 24.5 pg — ABNORMAL LOW (ref 26.0–34.0)
MCHC: 30.9 g/dL (ref 30.0–36.0)
MCV: 79.1 fL (ref 78.0–100.0)
MONOS PCT: 22 % — AB (ref 3–12)
Monocytes Absolute: 1.6 10*3/uL — ABNORMAL HIGH (ref 0.1–1.0)
Neutro Abs: 4.5 10*3/uL (ref 1.7–7.7)
Neutrophils Relative %: 61 % (ref 43–77)
Platelets: 69 10*3/uL — ABNORMAL LOW (ref 150–400)
RBC: 3.88 MIL/uL (ref 3.87–5.11)
RDW: 16.7 % — ABNORMAL HIGH (ref 11.5–15.5)
WBC: 7.3 10*3/uL (ref 4.0–10.5)
nRBC: 1 /100 WBC — ABNORMAL HIGH

## 2014-09-12 LAB — URINE CULTURE
CULTURE: NO GROWTH
Colony Count: NO GROWTH

## 2014-09-12 LAB — BASIC METABOLIC PANEL
ANION GAP: 11 (ref 5–15)
BUN: 8 mg/dL (ref 6–23)
CO2: 24 mmol/L (ref 19–32)
CREATININE: 0.81 mg/dL (ref 0.50–1.10)
Calcium: 8 mg/dL — ABNORMAL LOW (ref 8.4–10.5)
Chloride: 104 mmol/L (ref 96–112)
GFR calc Af Amer: >90 mL/min (ref >90)
GFR calc non Af Amer: 78 mL/min — ABNORMAL LOW (ref >90)
Glucose, Bld: 174 mg/dL — ABNORMAL HIGH (ref 70–99)
POTASSIUM: 3.8 mmol/L (ref 3.5–5.1)
Sodium: 139 mmol/L (ref 135–145)

## 2014-09-12 MED ORDER — AMOXICILLIN-POT CLAVULANATE 875-125 MG PO TABS
1.0000 | ORAL_TABLET | Freq: Two times a day (BID) | ORAL | Status: DC
  Administered 2014-09-12: 1 via ORAL
  Filled 2014-09-12 (×2): qty 1

## 2014-09-12 MED ORDER — HEPARIN SOD (PORK) LOCK FLUSH 10 UNIT/ML IV SOLN
10.0000 [IU] | Freq: Once | INTRAVENOUS | Status: DC
  Filled 2014-09-12: qty 1

## 2014-09-12 MED ORDER — ACYCLOVIR 200 MG PO CAPS: 200.0000 mg | ORAL_CAPSULE | Freq: Every day | ORAL | Status: DC

## 2014-09-12 MED ORDER — HEPARIN SOD (PORK) LOCK FLUSH 100 UNIT/ML IV SOLN
500.0000 [IU] | INTRAVENOUS | Status: AC | PRN
  Administered 2014-09-12: 500 [IU]
  Filled 2014-09-12: qty 5

## 2014-09-12 MED ORDER — HEPARIN SOD (PORK) LOCK FLUSH 100 UNIT/ML IV SOLN: 500.0000 [IU] | INTRAVENOUS | Status: DC | PRN

## 2014-09-12 MED ORDER — AMOXICILLIN-POT CLAVULANATE 875-125 MG PO TABS: 1.0000 | ORAL_TABLET | Freq: Two times a day (BID) | ORAL | Status: DC

## 2014-09-12 MED ORDER — BIOTENE DRY MOUTH MT LIQD
15.0000 mL | Freq: Four times a day (QID) | OROMUCOSAL | Status: DC
Start: 2014-09-12 — End: 2015-07-09

## 2014-09-12 MED ORDER — FLUCONAZOLE 200 MG PO TABS: 200.0000 mg | ORAL_TABLET | Freq: Every day | ORAL | Status: DC

## 2014-09-12 MED ORDER — FLUCONAZOLE 200 MG PO TABS
200.0000 mg | ORAL_TABLET | Freq: Every day | ORAL | Status: DC
  Filled 2014-09-12: qty 1

## 2014-09-12 MED ORDER — ACYCLOVIR 200 MG PO CAPS
200.0000 mg | ORAL_CAPSULE | Freq: Every day | ORAL | Status: DC
  Administered 2014-09-12: 200 mg via ORAL
  Filled 2014-09-12 (×5): qty 1

## 2014-09-12 MED ORDER — BIOTENE DRY MOUTH MT LIQD
15.0000 mL | Freq: Four times a day (QID) | OROMUCOSAL | Status: DC
  Administered 2014-09-12: 15 mL via OROMUCOSAL

## 2014-09-12 MED ORDER — SALINE SPRAY 0.65 % NA SOLN: 1.0000 | NASAL | Status: DC | PRN

## 2014-09-12 NOTE — Progress Notes (Signed)
September 12, 2014, 7:58 AM  Hospital day 3 Anti-infectives: vanc, zosyn, diflucan; po acyclovir added Chemotherapy: cycle 1 gemzar taxotere given 08-28-14 and 09-04-14, with neulasta on 09-06-14  Outpatient physicians Everitt Amber, Tommy Medal, MD (PCP); [name]; [name], [name]. L.Temesha Queener  Subjective: Feeling better overall. Mouth improved tho areas posterior buccal mucosa still symptomatic such that she is eating soft foods only, throat ok. Blew "yellow and green" from left nares last pm, no epistaxis last pm or today; encouraged saline nose spray q 30-60 min while awake and not blowing while platelets low. No lower respiratory symptoms. Not SOB up in room.No other bleeding. Is hungry.  No bowel movement past 24 hrs, not uncomfortable.      ONCOLOGIC HISTORY Patient had been menopausal since age 41 until she had what seemed to her to be a menstrual period in Sept 2015, with large blood clot at completion of bleeding stopped then. She continued with lesser bleeding over next several months until she was seen in 06-2014 by [name]. Hemoglobin was 12.7 on 07-04-14. CT CAP 06-25-14 had uterus 16.4 x 13.5 x 14.4 cm with marked expansion of endometrial canal by heterogeneous mass, bilateral pelvic sidewall adenopathy, iliac adenopathy with no definite retroperitoneal or mesenteric adenopathy, no ascites, no liver mets. Attempted endometrial sampling 06-26-14 and 07-03-14 was nondiagnostic; around that time the patient was passing clots and using one large maxipad hourly. She was seen by [name] 07-05-14, with uterine fundus palpable above umbilicus. She had surgery by [name] at [facility] on 07-14-14, which was exploratory laparotomy with TAH BSO, bilateral total pelvic lymphadenectomy and sampling of aortic and renal nodes. 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 Surgery was uncomplicated and she was DC home on ~ POD #3 with lovenox x 28 days. Case was presented at [facility] multidisciplinary conference 07-23-14, with recommendation for PET CT to evaluate inguinal and portahepatis nodes, and to consider adjuvant gemzar taxotere and possibly follow with 4 cycles of adriamycin, then to consider whole pelvic RT. She saw [name] for post op follow up on 07-28-14, with recommendation for gemzar taxotere and adriamycin, whole pelvic RT after chemo and consideration of Megace maintenance after chemo. PET 08-01-14 had some uptake in aortocaval and left paraaortic regions. SHe had day 1 cycle 1 gemzar taxotere on 08-28-14, day 8 cycle 1 on 09-04-14 and neulasta on 09-06-14. Admitted with neutropenic fever on day 14 cycle 1, ANC 0.3.    Objective: Vital signs in last 24 hours: Blood pressure 145/75, pulse 78, temperature 98.2 F (36.8 C), temperature source Oral, resp. rate 20, height 5' (1.524 m), weight 226 lb (102.513 kg), last menstrual period 02/04/2014, SpO2 97 %.   Intake/Output from previous day: 04/07 0701 - 04/08 0700 In: 4401 [P.O.:1160; I.V.:1650; IV Piggyback:600] Out: 825 [Urine:825] Intake/Output this shift:    Physical exam: awake, alert, looks better today. PERRL, not icteric. Oral mucosa moist, no thrush visible now, but can tell ulcers on posterior buccal mucosa bilaterally at least 0.5cm diameter. No epistaxis. Lungs clear. PAC accessed, site fine. Heart RRR. Abdomen soft, not tender, quiet. LE no pitting edema. Ecchymosis right forearm unchanged. Moves easily, speech fluent and appropriate.   Lab Results:  Recent Labs  09/11/14 0530 09/12/14 0528  WBC 2.2* 7.3  HGB 9.2* 9.5*  HCT 29.8*  30.7*  PLT 69* 69*   BMET  Recent Labs  09/11/14 0530 09/12/14 0528  NA 136 139  K 3.7 3.8  CL 103 104  CO2 24 24  GLUCOSE 88 174*  BUN 9 8  CREATININE 0.68 0.81  CALCIUM 7.8* 8.0*   Urine culture from admission  negative Blood cultures from admission no growth to date Requested HSV culture of oral lesions  Studies/Results: X-ray Chest Pa And Lateral  09/10/2014   CLINICAL DATA:  Neutropenic fever  EXAM: CHEST  2 VIEW  COMPARISON:  None.  FINDINGS: Linear opacities fairly symmetrically in the lower lungs, most consistent with atelectasis. No definitive cardiomegaly. Negative aortic and hilar contours. A right IJ porta catheter is in good position, tip at the distal SVC.  IMPRESSION: Bibasilar atelectasis.  No convincing pneumonia.   Electronically Signed   By: Monte Fantasia M.D.   On: 09/10/2014 21:16     Assessment/Plan: 1.Neutropenic fever: WBC and ANC recovered. Suggest changing IV antibiotics to po to cover sinuses, to complete course. I will see her back at [facility] and follow up cultures/ recheck labs on 09-15-14. Likely stable for DC later today or in next 24 hrs. Source of fever may have been sinus or the mucositis. Oral candida much better, continue diflucan po to complete 7 days; oral ulcers visible since thrush resolved probably herpetic, culture and added po acyclovir now. 2. Chemo thrombocytopenia: no ongoing epistaxis or other bleeding. Recommended she use saline nose spray every 30-60 min while awake (at bedside). 3.anemia from blood loss with gyn bleeding and surgery previously, and chemo. Likely some dehydration on admission. Continue oral iron and follow 4.high grade endometrial stromal sarcoma: presented with 4-5 months postmenopausal bleeding in 06-2014, surgery at [facility] 07-14-14, PET with some uptake aortocaval and paraaortic nodes. Plan is gemzar taxotere then adriamycin, pelvic RT and possible megace maintenance. 5.PAC in 6.morbid obesity. Post gastric banding  8.overdue dental care. Added biotene mouthwash 8.degenerative arthritis knees post injections by orthopedics prior to start of chemo, improved. 9.did not have flu vaccine. Symptoms do not suggest influenza 10.hx nonmelanoma  skin cancer  Please page me if need to discuss   [phone].  Thank you. Azalyn Sliwa P

## 2014-09-12 NOTE — Plan of Care (Signed)
Problem: Phase II Progression Outcomes Goal: IV changed to normal saline lock Outcome: Completed/Met Date Met:  09/12/14 kvo fluids for meds

## 2014-09-12 NOTE — Progress Notes (Signed)
Patient refusing SCD's. Is up as tol in room. Given IS for use but currently refusing to use it.

## 2014-09-12 NOTE — Discharge Summary (Signed)
[facility]  Physician Discharge Summary   Patient ID: Erin Santiago MRN: [id number] DOB/AGE: 1955-12-17 59 y.o.  Admit date: 09/10/2014 Discharge date: 09/12/2014  PCP: Tommy Medal, MD  DISCHARGE DIAGNOSES:  Active Problems:   Endometrial stromal sarcoma   Mucositis due to chemotherapy   Neutropenic fever   Thrombocytopenia   Dehydration   Chemotherapy induced thrombocytopenia   RECOMMENDATIONS FOR OUTPATIENT FOLLOW UP: 1. Follow-up with Dr. Candace Gallus next week   DISCHARGE CONDITION: fair  Diet recommendation: Soft diet for now  [facility] Weights   09/10/14 1754  Weight: 102.513 kg (226 lb)    INITIAL HISTORY: 59 year old Caucasian female with a past medical history of high grade endometrial stromal sarcoma who underwent surgery February. Presented to the hospital with complaints of fever. She was found to be neutropenic. She was admitted to the hospital for further management.  Consultations:  Oncology  Procedures:  None  HOSPITAL COURSE:   Neutropenic fever  Patient was admitted to the hospital and placed on broad-spectrum antibiotics. She had received Neulasta last week. Her neutrophil count started improving. Today it is greater than 4500. Fever appears to be subsiding. Her UA was unremarkable as was chest x-ray. But she was complaining of pain in the mouth. Oral lesions were noted. There was thrush and possibly superimposed viral infection per oncology. She's been started on Diflucan and acyclovir. She also has some nasal drainage and could have had sinusitis as well. In view of this, she will be discharged on Augmentin. Blood cultures negative so far. Her oncology will pursue this as an outpatient.   Endometrial stromal sarcoma As per oncology. Dr. Sheralyn Boatman will follow patient in her office. Patient is supposed to start her second cycle of chemotherapy on April 14.   Mucositis due to chemotherapy Continue Diflucan and acyclovir. Along with Magic  mouthwash.  Thrombocytopenia Most likely chemotherapy induced. She did have some epistaxis when she was blowing her nose. She was told to avoid doing so. No clear indication for platelet transfusion. No further recurrence. Platelet counts are stable.  Overall improved. Patient is keen on going home. Fevers appears to have subsided. Seen by her oncologist who agrees with discharge. Since patient is improved, she can be discharged today. She will be ambulated prior to discharge.  PERTINENT LABS:  The results of significant diagnostics from this hospitalization (including imaging, microbiology, ancillary and laboratory) are listed below for reference.    Microbiology: Recent Results (from the past 240 hour(s))  Urine Culture     Status: None   Collection Time: 09/10/14  3:10 PM  Result Value Ref Range Status   Urine Culture, Routine Culture, Urine  Final    Comment: Final - ===== COLONY COUNT: ===== 30,000 COLONIES/ML Multiple bacterial morphotypes present, none predominant. Suggest appropriate recollection if  clinically indicated.   Culture, blood (routine x 2)     Status: None (Preliminary result)   Collection Time: 09/10/14  8:40 PM  Result Value Ref Range Status   Specimen Description BLOOD RIGHT HAND  Final   Special Requests BOTTLES DRAWN AEROBIC AND ANAEROBIC 5CC  Final   Culture   Final           BLOOD CULTURE RECEIVED NO GROWTH TO DATE CULTURE WILL BE HELD FOR 5 DAYS BEFORE ISSUING A FINAL NEGATIVE REPORT Performed at [facility]    Report Status PENDING  Incomplete  Culture, blood (routine x 2)     Status: None (Preliminary result)   Collection Time: 09/10/14  8:45 PM  Result Value Ref Range Status   Specimen Description BLOOD LEFT ARM  Final   Special Requests BOTTLES DRAWN AEROBIC AND ANAEROBIC 5CC  Final   Culture   Final           BLOOD CULTURE RECEIVED NO GROWTH TO DATE CULTURE WILL BE HELD FOR 5 DAYS BEFORE ISSUING A FINAL NEGATIVE REPORT Performed at  [facility]    Report Status PENDING  Incomplete     Labs: Basic Metabolic Panel:  Recent Labs Lab 09/10/14 1514 09/11/14 0530 09/12/14 0528  NA 136 136 139  K 4.1 3.7 3.8  CL  --  103 104  CO2 25 24 24   GLUCOSE 101 88 174*  BUN 12.1 9 8   CREATININE 0.7 0.68 0.81  CALCIUM 9.0 7.8* 8.0*  MG  --  1.8  --   PHOS  --  4.0  --    Liver Function Tests:  Recent Labs Lab 09/10/14 1514 09/11/14 0530  AST 16 18  ALT 18 16  ALKPHOS 65 51  BILITOT 1.25* 1.2  PROT 6.7 5.8*  ALBUMIN 3.2* 2.8*   CBC:  Recent Labs Lab 09/10/14 1512 09/11/14 0530 09/12/14 0528  WBC 1.0* 2.2* 7.3  NEUTROABS 0.3* 1.0* 4.5  HGB 11.1* 9.2* 9.5*  HCT 34.7* 29.8* 30.7*  MCV 78.2* 79.0 79.1  PLT 75* 69* 69*    IMAGING STUDIES X-ray Chest Pa And Lateral  09/10/2014   CLINICAL DATA:  Neutropenic fever  EXAM: CHEST  2 VIEW  COMPARISON:  None.  FINDINGS: Linear opacities fairly symmetrically in the lower lungs, most consistent with atelectasis. No definitive cardiomegaly. Negative aortic and hilar contours. A right IJ porta catheter is in good position, tip at the distal SVC.  IMPRESSION: Bibasilar atelectasis.  No convincing pneumonia.   Electronically Signed   By: Monte Fantasia M.D.   On: 09/10/2014 21:16    DISCHARGE EXAMINATION: Filed Vitals:   09/11/14 2050 09/11/14 2147 09/12/14 0245 09/12/14 0456  BP:  134/69 137/88 145/75  Pulse: 88 83 79 78  Temp:  99.3 F (37.4 C) 98.7 F (37.1 C) 98.2 F (36.8 C)  TempSrc:  Oral Oral Oral  Resp:  18 20 20   Height:      Weight:      SpO2:  92%  97%   General appearance: alert, cooperative, appears stated age and no distress Resp: clear to auscultation bilaterally Cardio: regular rate and rhythm, S1, S2 normal, no murmur, click, rub or gallop GI: soft, non-tender; bowel sounds normal; no masses,  no organomegaly  DISPOSITION: Home  Discharge Instructions    Call MD for:  difficulty breathing, headache or visual disturbances     Complete by:  As directed      Call MD for:  extreme fatigue    Complete by:  As directed      Call MD for:  persistant dizziness or light-headedness    Complete by:  As directed      Call MD for:  persistant nausea and vomiting    Complete by:  As directed      Call MD for:  severe uncontrolled pain    Complete by:  As directed      Call MD for:  temperature >100.4    Complete by:  As directed      Discharge instructions    Complete by:  As directed   Please follow up with Dr. Marko Plume next week. Soft diet for now till the lesions in  your mouth are healed.     Increase activity slowly    Complete by:  As directed            ALLERGIES: No Known Allergies   Current Discharge Medication List    START taking these medications   Details  acyclovir (ZOVIRAX) 200 MG capsule Take 1 capsule (200 mg total) by mouth 5 (five) times daily. Qty: 35 capsule, Refills: 0   Associated Diagnoses: Mucositis due to chemotherapy    amoxicillin-clavulanate (AUGMENTIN) 875-125 MG per tablet Take 1 tablet by mouth every 12 (twelve) hours. For 6 days starting tonight. Qty: 12 tablet, Refills: 0    antiseptic oral rinse (BIOTENE) LIQD 15 mLs by Mouth Rinse route 4 (four) times daily. Qty: 237 mL, Refills: 0   Associated Diagnoses: Mucositis due to chemotherapy    fluconazole (DIFLUCAN) 200 MG tablet Take 1 tablet (200 mg total) by mouth daily. Qty: 7 tablet, Refills: 0    sodium chloride (OCEAN) 0.65 % SOLN nasal spray Place 1 spray into both nostrils as needed for congestion. Qty: 30 mL, Refills: 0   Associated Diagnoses: Epistaxis; Chemotherapy induced thrombocytopenia      CONTINUE these medications which have NOT CHANGED   Details  Alum & Mag Hydroxide-Simeth (MAGIC MOUTHWASH W/LIDOCAINE) SOLN QID prn for mouth/throat irriation- may swish swallow and or spit Qty: 240 mL, Refills: 0    dexamethasone (DECADRON) 4 MG tablet Take 2 tabs with food twice a day x 3 days beginning day prior to  Taxotere(Second week of Chemo) Qty: 12 tablet, Refills: 0   Associated Diagnoses: Endometrial stromal sarcoma    ferrous sulfate 325 (65 FE) MG tablet Take 1 tablet (325 mg total) by mouth daily with breakfast. Qty: 30 tablet, Refills: 3    hydrocortisone 2.5 % cream Apply 1 application topically as needed (itch/psoriasis.).     meloxicam (MOBIC) 15 MG tablet Take 15 mg by mouth daily.  Refills: 3    ondansetron (ZOFRAN ODT) 8 MG disintegrating tablet Take 1 tablet (8 mg total) by mouth every 8 (eight) hours as needed for nausea or vomiting. Qty: 30 tablet, Refills: 1   Associated Diagnoses: Endometrial stromal sarcoma    PRESCRIPTION MEDICATION     docusate sodium (COLACE) 100 MG capsule Take 100 mg by mouth 2 (two) times daily.     lidocaine-prilocaine (EMLA) cream Apply to Porta-cath 1-2 hrs prior to access. Cover with [employer]. Qty: 30 g, Refills: 1   Associated Diagnoses: Endometrial stromal sarcoma    LORazepam (ATIVAN) 1 MG tablet Place 1/2-1 tablet under the tongue or swallow every 6 hrs as needed for nausea. Will make you drowsy. Qty: 20 tablet, Refills: 0   Associated Diagnoses: Endometrial stromal sarcoma    Psyllium 28.3 % POWD Take 1 packet by mouth daily.    traMADol (ULTRAM) 50 MG tablet May take 25-50 mg PO BID PRN. (1/2 to 1 whole tab) Qty: 20 tablet, Refills: 0   Associated Diagnoses: Endometrial stromal sarcoma       Follow-up Information    Follow up with Gordy Levan, MD.   Specialty:  Oncology   Why:  on 09/15/14   Contact information:   [address] [place] [id number] [phone]       TOTAL DISCHARGE TIME: 35 minutes  [facility] Pager [phone]  09/12/2014, 12:37 PM

## 2014-09-12 NOTE — Progress Notes (Signed)
PHARMACIST - PHYSICIAN COMMUNICATION DR:   [place] Pink CONCERNING: Antibiotic IV to Oral Route Change Policy  RECOMMENDATION: This patient is receiving Diflucan by the intravenous route.  Based on criteria approved by the Pharmacy and Therapeutics Committee, the antibiotic(s) is/are being converted to the equivalent oral dose form(s).   DESCRIPTION: These criteria include:  Patient being treated for a respiratory tract infection, urinary tract infection, cellulitis or clostridium difficile associated diarrhea if on metronidazole  The patient is not neutropenic and does not exhibit a GI malabsorption state  The patient is eating (either orally or via tube) and/or has been taking other orally administered medications for a least 24 hours  The patient is improving clinically and has a Tmax < 100.5  If you have questions about this conversion, please contact the Pharmacy Department  []   [phone] )  [facility] []   [phone] )  [facility]  []   [phone] )  [facility] [x]   [phone] )  [place], PharmD, [place] Pager [phone] 09/12/2014 10:49 AM

## 2014-09-12 NOTE — Plan of Care (Signed)
Problem: Phase I Progression Outcomes Goal: OOB as tolerated unless otherwise ordered Outcome: Completed/Met Date Met:  09/12/14 OOB In room on own. Hx of arthritis in knees.

## 2014-09-12 NOTE — Discharge Instructions (Signed)
Oral Mucositis Oral mucositis is a mouth condition that may develop from treatment used to cure cancer. With this condition, sores may appear on your lips, gums, tongue,and the roof or floor of your mouth. CAUSES  Oral mucositis can happen to anyone who is being treated with cancer therapies, including:  Cancer drugs (chemotherapy).  Radiation (X-ray or other high-energy rays) for head or neck cancer.  Bone marrow transplants and stem cell transplants. Oralmucositis is not caused by infection. However, the sores can become infected after they form. Infection can make oral mucositis worse. The following factors increase your risk of oral mucositis:  Poor oral hygiene.  Dental problems or oral diseases.  Smoking.  Chewing tobacco.  Drinking alcohol.  Having other diseases such as diabetes, human immunodeficiency virus (HIV), acquired immunodeficiency syndrome (AIDS), or kidney disease.  Not drinking enough water.  Having dentures that do not fit right.  Being a child. Children are more likely than adults to develop oral mucositis, but children usually heal more quickly.  Being elderly. Elderly adults are more likely to develop oral mucositis. SYMPTOMS  Symptoms vary. They may be mild or severe. Symptoms usually show up 7 to 10 days after starting treatment. Symptoms may include:   Sores in the mouth that bleed.  Color changes inside the mouth. Red, shiny areas appear.  White patches or pus in the mouth.  Pain in the mouth and throat.  Pain when talking.  Dryness and a burning feeling in the mouth.  Saliva that is dry and thick.  Trouble eating, drinking, and swallowing.  Weight loss and malnutrition. This happens because eating is a problem. DIAGNOSIS  A caregiver will check your mouth. Then, the condition is given a grade. This grading system will help your caregiver treat your condition:  Grade 1: The inside of the mouth is sore and red.  Grade 2: There  is redness in the mouth. Open sores are present. Swallowing food might be uncomfortable.  Grade 3: There are open sores. The mouth is very red. It is very hard to swallow food.  Grade 4: No food or drink can be swallowed. TREATMENT  Oral mucositis usually heals on its own. Sometimes, changes in the cancer treatment can help. Keeping the mouth as clean and germ free as possible is very important.  Medicine may ease the condition. Different types of medicine may be needed, such as:  An antibiotic to fight infection, if present.  Medicine to help mucosal cells heal more quickly.  A water-based moisturizer for your lips, if they are affected.  Methods to control pain may include:  Keeping your mouth moist. You may suck on ice chips or sugar-free frozen ice pops.  Pain relievers that are swished around in the mouth. They will make the mouth numb to ease the pain (topical anesthetics).  Specific mouth rinses.  Prescribed, medicated gels. The gel coats the mouth. This protects nerve endings and lowers pain.  Narcotic pain medicines. These are strong drugs. They may be used if pain is very bad.  Mouth care can keep the mouth as healthy as possible and help to prevent infection. Mouth care includes:  A dental checkup. Your dental caregiver will make sure you have no teeth problems that could cause infection. Try to have the dental checkup before you begin your treatment for cancer.  Brushing your teeth several times a day. Use a soft toothbrush. Change to a new brush often. Use only gentle toothpastes. Ask your caregiver what product would   be best for you. Make sure that you also floss your teeth.  Rinsing your mouth after every meal. Rinse again at bedtime. Do not use mouthwash that contains alcohol. Ask your caregiver what would be best for you. HOME CARE INSTRUCTIONS  Only take over-the-counter or prescription medicines for pain, discomfort, or fever as directed by your caregiver.  Follow the directions carefully.  Do not smoke.  Do not drink alcohol.  Eat only bland, soft foods until your mouth sores heal. Avoid sugary and acidic foods and drinks.  Ask your caregiver if you should add high-protein shakes to your diet to avoid malnutrition and weight loss.  Drink enough fluids to keep your urine clear or pale yellow.  If you have dentures, take them out often.  Continue to check your mouth every day for any signs of oral mucositis.  Keep all follow-up appointments. SEEK MEDICAL CARE IF:  You notice redness, soreness, or dryness in your mouth.  You have mouth or throat pain that makes it hard to swallow or speak. SEEK IMMEDIATE MEDICAL CARE IF:  Your pain in your mouth or throat gets worse and does not improve with pain medicine.  You have a lot of bleeding in your mouth.  You develop new, open sores in your mouth.  You notice patches of pus forming in your mouth.  You cannot swallow solid food or liquids.  You have a fever. Document Released: 01/07/2011 Document Revised: 08/15/2011 Document Reviewed: 01/07/2011 ExitCare Patient Information 2015 [employer]. This information is not intended to replace advice given to you by your health care provider. Make sure you discuss any questions you have with your health care provider.  

## 2014-09-12 NOTE — Progress Notes (Signed)
PT Cancellation Note  Patient Details Name: Erin Santiago MRN: [id number] DOB: [birth year]   Cancelled Treatment:    Reason Eval/Treat Not Completed: PT screened, no needs identified, will sign off (pt is independent with mobility). Pt agrees that PT isn't needed.    Blondell Reveal Kistler 09/12/2014, 12:03 PM [phone]

## 2014-09-12 NOTE — Telephone Encounter (Signed)
S/w pt confirming labs/ov per 04/08 POF.... Erin Santiago °

## 2014-09-15 ENCOUNTER — Ambulatory Visit (HOSPITAL_BASED_OUTPATIENT_CLINIC_OR_DEPARTMENT_OTHER): Payer: Federal, State, Local not specified - PPO | Admitting: Oncology

## 2014-09-15 ENCOUNTER — Encounter: Payer: Self-pay | Admitting: Oncology

## 2014-09-15 ENCOUNTER — Other Ambulatory Visit (HOSPITAL_BASED_OUTPATIENT_CLINIC_OR_DEPARTMENT_OTHER): Payer: Federal, State, Local not specified - PPO

## 2014-09-15 ENCOUNTER — Telehealth: Payer: Self-pay | Admitting: Oncology

## 2014-09-15 VITALS — BP 129/74 | HR 77 | Temp 98.2°F | Resp 18 | Ht 60.0 in | Wt 227.4 lb

## 2014-09-15 DIAGNOSIS — C775 Secondary and unspecified malignant neoplasm of intrapelvic lymph nodes: Secondary | ICD-10-CM

## 2014-09-15 DIAGNOSIS — Z95828 Presence of other vascular implants and grafts: Secondary | ICD-10-CM

## 2014-09-15 DIAGNOSIS — D709 Neutropenia, unspecified: Secondary | ICD-10-CM

## 2014-09-15 DIAGNOSIS — C541 Malignant neoplasm of endometrium: Secondary | ICD-10-CM

## 2014-09-15 DIAGNOSIS — T451X5A Adverse effect of antineoplastic and immunosuppressive drugs, initial encounter: Secondary | ICD-10-CM

## 2014-09-15 DIAGNOSIS — K1231 Oral mucositis (ulcerative) due to antineoplastic therapy: Secondary | ICD-10-CM | POA: Diagnosis not present

## 2014-09-15 DIAGNOSIS — D6959 Other secondary thrombocytopenia: Secondary | ICD-10-CM

## 2014-09-15 DIAGNOSIS — D500 Iron deficiency anemia secondary to blood loss (chronic): Secondary | ICD-10-CM

## 2014-09-15 DIAGNOSIS — D701 Agranulocytosis secondary to cancer chemotherapy: Secondary | ICD-10-CM

## 2014-09-15 DIAGNOSIS — B370 Candidal stomatitis: Secondary | ICD-10-CM

## 2014-09-15 LAB — COMPREHENSIVE METABOLIC PANEL (CC13)
ALK PHOS: 86 U/L (ref 40–150)
ALT: 24 U/L (ref 0–55)
AST: 29 U/L (ref 5–34)
Albumin: 3.3 g/dL — ABNORMAL LOW (ref 3.5–5.0)
Anion Gap: 10 mEq/L (ref 3–11)
BILIRUBIN TOTAL: 0.43 mg/dL (ref 0.20–1.20)
BUN: 17.3 mg/dL (ref 7.0–26.0)
CALCIUM: 8.9 mg/dL (ref 8.4–10.4)
CO2: 25 mEq/L (ref 22–29)
Chloride: 107 mEq/L (ref 98–109)
Creatinine: 0.8 mg/dL (ref 0.6–1.1)
EGFR: 81 mL/min/{1.73_m2} — ABNORMAL LOW (ref >90)
Glucose: 111 mg/dl (ref 70–140)
Potassium: 4.2 mEq/L (ref 3.5–5.1)
SODIUM: 142 meq/L (ref 136–145)
TOTAL PROTEIN: 6.7 g/dL (ref 6.4–8.3)

## 2014-09-15 LAB — CBC WITH DIFFERENTIAL/PLATELET
BASO%: 0.5 % (ref 0.0–2.0)
Basophils Absolute: 0.1 10*3/uL (ref 0.0–0.1)
EOS%: 0.9 % (ref 0.0–7.0)
Eosinophils Absolute: 0.1 10*3/uL (ref 0.0–0.5)
HCT: 34.4 % — ABNORMAL LOW (ref 34.8–46.6)
HGB: 10.7 g/dL — ABNORMAL LOW (ref 11.6–15.9)
LYMPH%: 10.5 % — ABNORMAL LOW (ref 14.0–49.7)
MCH: 24.7 pg — AB (ref 25.1–34.0)
MCHC: 31.1 g/dL — ABNORMAL LOW (ref 31.5–36.0)
MCV: 79.4 fL — ABNORMAL LOW (ref 79.5–101.0)
MONO#: 0.9 10*3/uL (ref 0.1–0.9)
MONO%: 6.2 % (ref 0.0–14.0)
NEUT#: 12.5 10*3/uL — ABNORMAL HIGH (ref 1.5–6.5)
NEUT%: 81.9 % — ABNORMAL HIGH (ref 38.4–76.8)
Platelets: 220 10*3/uL (ref 145–400)
RBC: 4.33 10*6/uL (ref 3.70–5.45)
RDW: 18 % — ABNORMAL HIGH (ref 11.2–14.5)
WBC: 15.3 10*3/uL — ABNORMAL HIGH (ref 3.9–10.3)
lymph#: 1.6 10*3/uL (ref 0.9–3.3)

## 2014-09-15 MED ORDER — FERROUS FUMARATE 325 (106 FE) MG PO TABS: ORAL_TABLET | ORAL | Status: DC

## 2014-09-15 MED ORDER — FLUCONAZOLE 100 MG PO TABS: ORAL_TABLET | ORAL | Status: DC

## 2014-09-15 NOTE — Patient Instructions (Signed)
You can stop acyclovir after another 1-2 days when the mouth ulcers have improved. You should have a few of these left at home on hand, to start if ulcers in mouth reoccur.  Stop Augmentin antibiotic now, as all cultures were negative in hospital  Continue biotene 3 x daily thru rest of chemo  Prescription to your pharmacy for diflucan (fluconazole) 100 mg to take one daily each day that you take steroids around taxotere chemotherapy, to decrease chance of thrush.  We will let your pharmacy know to give you either Hemocyte or the same iron in over the counter form as ferrous fumarate. Best to take iron on empty stomach with OJ or with Vitamin C tablet.   We will use the OnPro injector for neulasta shot 24 hrs after taxotere.

## 2014-09-15 NOTE — Progress Notes (Signed)
Enrolled pt in the Neulasta First Step program.  Faxed signed form and activated card today.  °

## 2014-09-15 NOTE — Progress Notes (Signed)
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 Cycle 1 gemzar taxotere was complicated by neutropenic fever day 14 despite neulasta (given day 10 due to insurance constraints)  She was hospitalized for the neutropenic fever from 4-6 thru 09-12-14; ANC was 0.3 and temperature 101. Source of fever appeared to be oral mucositis, clinically candida and HSV; blood and urine culture were negative. She was also thrombocytopenic without bleeding.  Mucositis has continued to improve since hospital discharge 2 days ago, tho still soreness posterior buccal areas bilaterally. She is tolerating soft foods and liquids without difficulty. She did not fill prescription for diflucan at hospital DC, has continued biotene mouthwash and oral acyclovir. She has also been on augmentin, which will be stopped now as cultures all negative. Sinus congestion has resolved. She has had no further fever and no chills. Bowels moved x 2 today, loose but not watery and she will let us know promptly if diarrhea. She is otherwise feeling much better, with no new or different concerns.  PAC in Has not had flu vaccine  She will need to remain out of work for duration of chemotherapy. Note written for her employer now, may need additional paperwork done.   ONCOLOGIC HISTORY Patient had been menopausal since age 24 until she had what seemed to her to be a menstrual period in Sept 2015, with large blood clot at completion of bleeding stopped then. She continued with lesser bleeding over next several months until she was seen in 06-2014 by [name]. Hemoglobin was 12.7 on 07-04-14. CT CAP 06-25-14 had uterus 16.4 x 13.5 x 14.4 cm with marked expansion of endometrial canal by heterogeneous mass,  bilateral pelvic sidewall adenopathy, iliac adenopathy with no definite retroperitoneal or mesenteric adenopathy, no ascites, no liver mets. Attempted endometrial sampling 06-26-14 and 07-03-14 was nondiagnostic; around that time the patient was passing clots and using one large maxipad hourly. She was seen by [name] 07-05-14, with uterine fundus palpable above umbilicus. She had surgery by [name] at [facility] on 07-14-14, which was exploratory laparotomy with TAH BSO, bilateral total pelvic lymphadenectomy and sampling of aortic and renal nodes. Pathology [facility] [phone]) found high grade endometrial stromal sarcoma with primary 18 cm, 8/45 nodes involved including 2 right pelvic and 6 left pelvic nodes; IHC for ER PR was to be done at [facility]. Surgery was uncomplicated and she was DC home on ~ POD #3 with lovenox x 28 days. Case was presented at [facility] multidisciplinary conference 07-23-14, with recommendation for PET CT to evaluate inguinal and portahepatis nodes, and to consider adjuvant gemzar taxotere and possibly follow with 4 cycles of adriamycin, then to consider whole pelvic RT. She saw [name] for post op follow up on 07-28-14, with recommendation for gemzar taxotere and adriamycin, whole pelvic RT after chemo and consideration of Megace maintenance after chemo. PET 08-01-14 had some uptake in aortocaval and left paraaortic regions. She had day 1 cycle 1 gemzar taxotere on 08-28-14, day 8 cycle 1 on 09-04-14 and neulasta on 09-06-14. Admitted with neutropenic fever on day 14 cycle 1.   Review of systems as above, also: No nausea or vomiting. No increased SOB or chest pain. Minimal LE swelling, no tenderness. Bladder ok. Throat not sore. No epistaxis, bruising, other bleeding. Remainder  of 10 point Review of Systems negative.  Objective:  Vital signs in last 24 hours:  BP 129/74 mmHg  Pulse 77  Temp(Src) 98.2 F (36.8 C) (Oral)  Resp 18  Ht 5' (1.524 m)  Wt 227 lb 6.4 oz (103.148 kg)  BMI 44.41  kg/m2  LMP 02/04/2014 Weight down 3 lbs Alert, oriented and appropriate. Ambulatory without assistance, some difficulty getting on and off exam table due to knees.  Partial alopecia  HEENT:PERRL, sclerae not icteric. Oral mucosa moist without thrush, superficial ulcerations posterior buccal mucosa bilaterally much improved but still ) .05 cm diameter bilaterally, posterior pharynx clear. No nasal congestion or bleeding. Neck supple. No JVD.  Lymphatics:no cervical,supraclavicular adenopathy Resp: clear to auscultation bilaterally and normal percussion bilaterally Cardio: regular rate and rhythm. No gallop. GI: abdomen obese soft, nontender, not distended, no mass or organomegaly. Normally active bowel sounds. Surgical incision not remarkable. Musculoskeletal/ Extremities: without pitting edema, cords, tenderness Neuro: no change peripheral neuropathy. Otherwise nonfocal. Psych appropriate mood and affect Skin without rash, ecchymosis, petechiae Portacath-without erythema or tenderness  Lab Results:  Results for orders placed or performed in visit on 09/15/14  CBC with Differential  Result Value Ref Range   WBC 15.3 (H) 3.9 - 10.3 10e3/uL   NEUT# 12.5 (H) 1.5 - 6.5 10e3/uL   HGB 10.7 (L) 11.6 - 15.9 g/dL   HCT 34.4 (L) 34.8 - 46.6 %   Platelets 220 145 - 400 10e3/uL   MCV 79.4 (L) 79.5 - 101.0 fL   MCH 24.7 (L) 25.1 - 34.0 pg   MCHC 31.1 (L) 31.5 - 36.0 g/dL   RBC 4.33 3.70 - 5.45 10e6/uL   RDW 18.0 (H) 11.2 - 14.5 %   lymph# 1.6 0.9 - 3.3 10e3/uL   MONO# 0.9 0.1 - 0.9 10e3/uL   Eosinophils Absolute 0.1 0.0 - 0.5 10e3/uL   Basophils Absolute 0.1 0.0 - 0.1 10e3/uL   NEUT% 81.9 (H) 38.4 - 76.8 %   LYMPH% 10.5 (L) 14.0 - 49.7 %   MONO% 6.2 0.0 - 14.0 %   EOS% 0.9 0.0 - 7.0 %   BASO% 0.5 0.0 - 2.0 %  Comprehensive metabolic panel (Cmet) - [facility]  Result Value Ref Range   Sodium 142 136 - 145 mEq/L   Potassium 4.2 3.5 - 5.1 mEq/L   Chloride 107 98 - 109 mEq/L   CO2 25 22 - 29  mEq/L   Glucose 111 70 - 140 mg/dl   BUN 17.3 7.0 - 26.0 mg/dL   Creatinine 0.8 0.6 - 1.1 mg/dL   Total Bilirubin 0.43 0.20 - 1.20 mg/dL   Alkaline Phosphatase 86 40 - 150 U/L   AST 29 5 - 34 U/L   ALT 24 0 - 55 U/L   Total Protein 6.7 6.4 - 8.3 g/dL   Albumin 3.3 (L) 3.5 - 5.0 g/dL   Calcium 8.9 8.4 - 10.4 mg/dL   Anion Gap 10 3 - 11 mEq/L   EGFR 81 (L) >90 ml/min/1.73 m2    Blood and urine cultures from 4-6 noted, negative HSV culture not done day of DC in hospital as ordered.  Studies/Results:  No results found.  Medications: I have reviewed the patient's current medications. WIll change oral iron to either Hemocyte or ferrous fumarate one daily on empty stomach with OJ or Vit C  DISCUSSION: Continue biotene for duration of chemo. Will use diflucan 100 mg daily on days of steroids around taxotere. Will take acyclovir for ~ 48 hrs  more, then have ~ 3 tablets remaining which she can start and call if mucositis recurs. Discussed using OnPro injector for neulasta so that administration can be at 24 hrs post day 8 rather than 48 hrs: patient agrees to [employer].   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 Cycle 1 given 3-24 and 09-04-14 with neulasta 09-06-14, then admitted with neutropenic fever day 14. Counts recovered rapidly, stable for day  1 cycle 2 on 09-18-14. WIll use OnPro neulasta home injection if possible, so that it is given on day 9. Interventions for mucositis as above. I will see her with day 8 treatment. 2. Mucositis: candida and clinically HSV. Continue biotene, ~ 48 hrs more acyclovir, then diflucan with steroids around day 8 and other as above. 3.morbid obesity, BMI 44.6 4.post gastric banding with initial weight loss 50 lbs 5.degenerative arthritis knees: symptoms better with interventions by orthopedics prior to start of chemo 6.overdue dental  care tho no acute problems 7.patient stopped lovenox after ~ 2 of planned 4 week post op course. Clinically no evidence of DVT today 8.chronic alopecia such that hair loss with chemo is not concerning to her 9.no flu vaccine 10.history skin ca, apparently nonmelanoma 11,iron deficiency anemia related to gyn bleeding previously: not tolerating ferrous sulfate. Will try ferrous fumarate/ Hemocyte. 12.PAC in  Chemo orders confirmed, ONPro requested.Patient understands all instructions and is in agreement with plans. Time spent 30 min including >50% counseling and coordination of care.   LIVESAY,LENNIS P, MD   09/15/2014, 7:30 PM

## 2014-09-15 NOTE — Telephone Encounter (Signed)
per pof to sch pt appt-gave pt copy of sch °

## 2014-09-16 ENCOUNTER — Other Ambulatory Visit: Payer: Self-pay | Admitting: Oncology

## 2014-09-16 DIAGNOSIS — D701 Agranulocytosis secondary to cancer chemotherapy: Secondary | ICD-10-CM | POA: Insufficient documentation

## 2014-09-16 DIAGNOSIS — Z95828 Presence of other vascular implants and grafts: Secondary | ICD-10-CM | POA: Insufficient documentation

## 2014-09-16 DIAGNOSIS — C541 Malignant neoplasm of endometrium: Secondary | ICD-10-CM

## 2014-09-16 DIAGNOSIS — B370 Candidal stomatitis: Secondary | ICD-10-CM | POA: Insufficient documentation

## 2014-09-16 DIAGNOSIS — T451X5A Adverse effect of antineoplastic and immunosuppressive drugs, initial encounter: Secondary | ICD-10-CM

## 2014-09-16 LAB — CULTURE, BLOOD (SINGLE)

## 2014-09-16 MED ORDER — DEXAMETHASONE 4 MG PO TABS: ORAL_TABLET | ORAL | Status: DC

## 2014-09-17 LAB — CULTURE, BLOOD (ROUTINE X 2)
CULTURE: NO GROWTH
Culture: NO GROWTH

## 2014-09-18 ENCOUNTER — Ambulatory Visit (HOSPITAL_BASED_OUTPATIENT_CLINIC_OR_DEPARTMENT_OTHER): Payer: Federal, State, Local not specified - PPO

## 2014-09-18 ENCOUNTER — Other Ambulatory Visit (HOSPITAL_BASED_OUTPATIENT_CLINIC_OR_DEPARTMENT_OTHER): Payer: Federal, State, Local not specified - PPO

## 2014-09-18 ENCOUNTER — Other Ambulatory Visit (HOSPITAL_COMMUNITY)
Admission: AD | Admit: 2014-09-18 | Discharge: 2014-09-18 | Disposition: A | Discharge status: Home / Self Care | Payer: Federal, State, Local not specified - PPO | Source: Ambulatory Visit | Attending: Oncology | Admitting: Oncology

## 2014-09-18 VITALS — BP 120/56 | HR 72 | Temp 98.6°F | Resp 18

## 2014-09-18 DIAGNOSIS — Z5111 Encounter for antineoplastic chemotherapy: Secondary | ICD-10-CM

## 2014-09-18 DIAGNOSIS — C541 Malignant neoplasm of endometrium: Secondary | ICD-10-CM

## 2014-09-18 LAB — COMPREHENSIVE METABOLIC PANEL
ALBUMIN: 3.8 g/dL (ref 3.5–5.2)
ALK PHOS: 82 U/L (ref 39–117)
ALT: 18 U/L (ref 0–35)
AST: 21 U/L (ref 0–37)
Anion gap: 5 (ref 5–15)
BILIRUBIN TOTAL: 0.3 mg/dL (ref 0.3–1.2)
BUN: 21 mg/dL (ref 6–23)
CHLORIDE: 108 mmol/L (ref 96–112)
CO2: 26 mmol/L (ref 19–32)
Calcium: 8.8 mg/dL (ref 8.4–10.5)
Creatinine, Ser: 0.76 mg/dL (ref 0.50–1.10)
GFR calc Af Amer: >90 mL/min (ref >90)
GFR calc non Af Amer: >90 mL/min (ref >90)
Glucose, Bld: 99 mg/dL (ref 70–99)
Potassium: 4.7 mmol/L (ref 3.5–5.1)
SODIUM: 139 mmol/L (ref 135–145)
Total Protein: 7.4 g/dL (ref 6.0–8.3)

## 2014-09-18 LAB — CBC WITH DIFFERENTIAL/PLATELET
BASO%: 0.7 % (ref 0.0–2.0)
BASOS ABS: 0.1 10*3/uL (ref 0.0–0.1)
EOS%: 0.4 % (ref 0.0–7.0)
Eosinophils Absolute: 0.1 10*3/uL (ref 0.0–0.5)
HCT: 34.4 % — ABNORMAL LOW (ref 34.8–46.6)
HEMOGLOBIN: 10.7 g/dL — AB (ref 11.6–15.9)
LYMPH%: 5.3 % — ABNORMAL LOW (ref 14.0–49.7)
MCH: 24.1 pg — ABNORMAL LOW (ref 25.1–34.0)
MCHC: 31.1 g/dL — ABNORMAL LOW (ref 31.5–36.0)
MCV: 77.4 fL — AB (ref 79.5–101.0)
MONO#: 0.3 10*3/uL (ref 0.1–0.9)
MONO%: 2.2 % (ref 0.0–14.0)
NEUT%: 91.4 % — ABNORMAL HIGH (ref 38.4–76.8)
NEUTROS ABS: 14 10*3/uL — AB (ref 1.5–6.5)
PLATELETS: 549 10*3/uL — AB (ref 145–400)
RBC: 4.44 10*6/uL (ref 3.70–5.45)
RDW: 18.9 % — AB (ref 11.2–14.5)
WBC: 15.4 10*3/uL — ABNORMAL HIGH (ref 3.9–10.3)
lymph#: 0.8 10*3/uL — ABNORMAL LOW (ref 0.9–3.3)

## 2014-09-18 MED ORDER — SODIUM CHLORIDE 0.9 % IV SOLN
900.0000 mg/m2 | Freq: Once | INTRAVENOUS | Status: AC
  Administered 2014-09-18: 1900 mg via INTRAVENOUS
  Filled 2014-09-18: qty 49.97

## 2014-09-18 MED ORDER — SODIUM CHLORIDE 0.9 % IV SOLN
Freq: Once | INTRAVENOUS | Status: AC
  Administered 2014-09-18: 12:00:00 via INTRAVENOUS
  Filled 2014-09-18: qty 4

## 2014-09-18 MED ORDER — SODIUM CHLORIDE 0.9 % IJ SOLN
10.0000 mL | INTRAMUSCULAR | Status: DC | PRN
  Administered 2014-09-18: 10 mL
  Filled 2014-09-18: qty 10

## 2014-09-18 MED ORDER — HEPARIN SOD (PORK) LOCK FLUSH 100 UNIT/ML IV SOLN
500.0000 [IU] | Freq: Once | INTRAVENOUS | Status: AC | PRN
  Administered 2014-09-18: 500 [IU]
  Filled 2014-09-18: qty 5

## 2014-09-18 MED ORDER — SODIUM CHLORIDE 0.9 % IV SOLN
Freq: Once | INTRAVENOUS | Status: AC
  Administered 2014-09-18: 12:00:00 via INTRAVENOUS

## 2014-09-18 NOTE — Patient Instructions (Signed)
[facility] Discharge Instructions for Patients Receiving Chemotherapy  Today you received the following chemotherapy agents Gemzar  To help prevent nausea and vomiting after your treatment, we encourage you to take your nausea medication as prescribed   If you develop nausea and vomiting that is not controlled by your nausea medication, call the clinic.   BELOW ARE SYMPTOMS THAT SHOULD BE REPORTED IMMEDIATELY:  *FEVER GREATER THAN 100.5 F  *CHILLS WITH OR WITHOUT FEVER  NAUSEA AND VOMITING THAT IS NOT CONTROLLED WITH YOUR NAUSEA MEDICATION  *UNUSUAL SHORTNESS OF BREATH  *UNUSUAL BRUISING OR BLEEDING  TENDERNESS IN MOUTH AND THROAT WITH OR WITHOUT PRESENCE OF ULCERS  *URINARY PROBLEMS  *BOWEL PROBLEMS  UNUSUAL RASH Items with * indicate a potential emergency and should be followed up as soon as possible.  Feel free to call the clinic you have any questions or concerns. The clinic phone number is [phone].  Please show the CHEMO ALERT CARD at check-in to the Emergency Department and triage nurse.   

## 2014-09-21 ENCOUNTER — Other Ambulatory Visit: Payer: Self-pay | Admitting: Oncology

## 2014-09-25 ENCOUNTER — Telehealth: Payer: Self-pay | Admitting: Oncology

## 2014-09-25 ENCOUNTER — Ambulatory Visit (HOSPITAL_BASED_OUTPATIENT_CLINIC_OR_DEPARTMENT_OTHER): Payer: Federal, State, Local not specified - PPO

## 2014-09-25 ENCOUNTER — Ambulatory Visit (HOSPITAL_BASED_OUTPATIENT_CLINIC_OR_DEPARTMENT_OTHER): Payer: Federal, State, Local not specified - PPO | Admitting: Oncology

## 2014-09-25 ENCOUNTER — Encounter: Payer: Self-pay | Admitting: Oncology

## 2014-09-25 ENCOUNTER — Other Ambulatory Visit (HOSPITAL_BASED_OUTPATIENT_CLINIC_OR_DEPARTMENT_OTHER): Payer: Federal, State, Local not specified - PPO

## 2014-09-25 VITALS — BP 134/73 | HR 77 | Temp 98.6°F | Resp 20 | Ht 60.0 in | Wt 228.5 lb

## 2014-09-25 DIAGNOSIS — D701 Agranulocytosis secondary to cancer chemotherapy: Secondary | ICD-10-CM

## 2014-09-25 DIAGNOSIS — Z5111 Encounter for antineoplastic chemotherapy: Secondary | ICD-10-CM

## 2014-09-25 DIAGNOSIS — D500 Iron deficiency anemia secondary to blood loss (chronic): Secondary | ICD-10-CM

## 2014-09-25 DIAGNOSIS — K1231 Oral mucositis (ulcerative) due to antineoplastic therapy: Secondary | ICD-10-CM

## 2014-09-25 DIAGNOSIS — C541 Malignant neoplasm of endometrium: Secondary | ICD-10-CM

## 2014-09-25 DIAGNOSIS — T451X5A Adverse effect of antineoplastic and immunosuppressive drugs, initial encounter: Secondary | ICD-10-CM

## 2014-09-25 DIAGNOSIS — Z95828 Presence of other vascular implants and grafts: Secondary | ICD-10-CM

## 2014-09-25 DIAGNOSIS — D6959 Other secondary thrombocytopenia: Secondary | ICD-10-CM

## 2014-09-25 LAB — COMPREHENSIVE METABOLIC PANEL (CC13)
ALBUMIN: 3.5 g/dL (ref 3.5–5.0)
ALK PHOS: 73 U/L (ref 40–150)
ALT: 24 U/L (ref 0–55)
ANION GAP: 13 meq/L — AB (ref 3–11)
AST: 23 U/L (ref 5–34)
BILIRUBIN TOTAL: 0.43 mg/dL (ref 0.20–1.20)
BUN: 22.2 mg/dL (ref 7.0–26.0)
CO2: 20 meq/L — AB (ref 22–29)
Calcium: 9.1 mg/dL (ref 8.4–10.4)
Chloride: 107 mEq/L (ref 98–109)
Creatinine: 0.7 mg/dL (ref 0.6–1.1)
EGFR: 90 mL/min/{1.73_m2} — AB (ref >90)
GLUCOSE: 130 mg/dL (ref 70–140)
Potassium: 4.2 mEq/L (ref 3.5–5.1)
Sodium: 140 mEq/L (ref 136–145)
Total Protein: 7.1 g/dL (ref 6.4–8.3)

## 2014-09-25 LAB — CBC WITH DIFFERENTIAL/PLATELET
BASO%: 0.1 % (ref 0.0–2.0)
BASOS ABS: 0 10*3/uL (ref 0.0–0.1)
EOS%: 0 % (ref 0.0–7.0)
Eosinophils Absolute: 0 10*3/uL (ref 0.0–0.5)
HCT: 33.3 % — ABNORMAL LOW (ref 34.8–46.6)
HEMOGLOBIN: 10.7 g/dL — AB (ref 11.6–15.9)
LYMPH#: 0.6 10*3/uL — AB (ref 0.9–3.3)
LYMPH%: 6.1 % — AB (ref 14.0–49.7)
MCH: 25.3 pg (ref 25.1–34.0)
MCHC: 32.1 g/dL (ref 31.5–36.0)
MCV: 78.7 fL — AB (ref 79.5–101.0)
MONO#: 0.4 10*3/uL (ref 0.1–0.9)
MONO%: 3.8 % (ref 0.0–14.0)
NEUT#: 9.1 10*3/uL — ABNORMAL HIGH (ref 1.5–6.5)
NEUT%: 90 % — AB (ref 38.4–76.8)
PLATELETS: 445 10*3/uL — AB (ref 145–400)
RBC: 4.23 10*6/uL (ref 3.70–5.45)
RDW: 18.3 % — ABNORMAL HIGH (ref 11.2–14.5)
WBC: 10.1 10*3/uL (ref 3.9–10.3)

## 2014-09-25 MED ORDER — SODIUM CHLORIDE 0.9 % IJ SOLN
10.0000 mL | INTRAMUSCULAR | Status: DC | PRN
  Administered 2014-09-25: 10 mL
  Filled 2014-09-25: qty 10

## 2014-09-25 MED ORDER — SODIUM CHLORIDE 0.9 % IV SOLN
Freq: Once | INTRAVENOUS | Status: AC
  Administered 2014-09-25: 14:00:00 via INTRAVENOUS
  Filled 2014-09-25: qty 8

## 2014-09-25 MED ORDER — SODIUM CHLORIDE 0.9 % IV SOLN
Freq: Once | INTRAVENOUS | Status: AC
  Administered 2014-09-25: 13:00:00 via INTRAVENOUS

## 2014-09-25 MED ORDER — GEMCITABINE HCL CHEMO INJECTION 1 GM/26.3ML
900.0000 mg/m2 | Freq: Once | INTRAVENOUS | Status: AC
  Administered 2014-09-25: 1900 mg via INTRAVENOUS
  Filled 2014-09-25: qty 49.97

## 2014-09-25 MED ORDER — HEPARIN SOD (PORK) LOCK FLUSH 100 UNIT/ML IV SOLN
500.0000 [IU] | Freq: Once | INTRAVENOUS | Status: AC | PRN
  Administered 2014-09-25: 500 [IU]
  Filled 2014-09-25: qty 5

## 2014-09-25 MED ORDER — PEGFILGRASTIM 6 MG/0.6ML ~~LOC~~ PSKT
6.0000 mg | PREFILLED_SYRINGE | Freq: Once | SUBCUTANEOUS | Status: AC
Start: 2014-09-25 — End: 2014-09-25
  Administered 2014-09-25: 6 mg via SUBCUTANEOUS
  Filled 2014-09-25: qty 0.6

## 2014-09-25 MED ORDER — DOCETAXEL CHEMO INJECTION 160 MG/16ML
95.0000 mg/m2 | Freq: Once | INTRAVENOUS | Status: AC
  Administered 2014-09-25: 200 mg via INTRAVENOUS
  Filled 2014-09-25: qty 20

## 2014-09-25 NOTE — Patient Instructions (Signed)
Start diflucan (fluconazole) 100 mg daily beginning today, for at least 3 days. If mouth sores by end of 3 days, call Dr Marko Plume and continue diflucan.  Start acyclovir (viral medicine) 200 mg 5x daily and call if any mouth sores begin  Continue biotene  Call tomorrow if any questions or concerns about the OnPro injector   With your first cycle of chemo, your blood counts were at their lowest on the Wednesday after gemzar taxotere. Call if you are very tired, if any mouth sores or if any fever (>=100.5) next week before scheduled visit.  [phone]

## 2014-09-25 NOTE — Patient Instructions (Signed)
[facility] Discharge Instructions for Patients Receiving Chemotherapy  Today you received the following chemotherapy agents: Gemzar and Taxotere.   To help prevent nausea and vomiting after your treatment, we encourage you to take your nausea medication as directed.    If you develop nausea and vomiting that is not controlled by your nausea medication, call the clinic.   BELOW ARE SYMPTOMS THAT SHOULD BE REPORTED IMMEDIATELY:  *FEVER GREATER THAN 100.5 F  *CHILLS WITH OR WITHOUT FEVER  NAUSEA AND VOMITING THAT IS NOT CONTROLLED WITH YOUR NAUSEA MEDICATION  *UNUSUAL SHORTNESS OF BREATH  *UNUSUAL BRUISING OR BLEEDING  TENDERNESS IN MOUTH AND THROAT WITH OR WITHOUT PRESENCE OF ULCERS  *URINARY PROBLEMS  *BOWEL PROBLEMS  UNUSUAL RASH Items with * indicate a potential emergency and should be followed up as soon as possible.  Feel free to call the clinic you have any questions or concerns. The clinic phone number is [phone].  Please show the [place] at check-in to the Emergency Department and triage nurse.

## 2014-09-25 NOTE — Progress Notes (Signed)
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 Cycle 1 was complicated by neutropenic fever (ANC 0.3 on day 14 despite neulasta). She is due day 8 cycle 2 today.  She will use OnPro for neulasta beginning present cycle 2, which will allow administration 24 hours earlier than we are able to give at this offic   Patient tolerated day 1 cycle 2 with no significant problems. 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  Mouth has felt "essentially normal" for past 2 days. She is using biotene mouthwash regularly, forgot to start diflucan with decadron but will do that today, has acyclovir on hand if any mucositis begins. She has had no nausea at all. Bowels are moving regularly. She has begun oral iron, has found preparation of ferrous fumarate that includes vit C. No bleeding. No fever. Slight SOB with exertion just after chemo, none now.   PAC in No flu vaccine   ONCOLOGIC HISTORY Patient had been menopausal since age 6 until she had what seemed to her to be a menstrual period in Sept 2015, with large blood clot at completion of bleeding stopped then. She continued with lesser bleeding over next several months until she was seen in 06-2014 by [name]. Hemoglobin was 12.7 on 07-04-14. CT CAP 06-25-14 had uterus 16.4 x 13.5 x 14.4 cm with marked expansion of endometrial canal by heterogeneous mass, bilateral pelvic sidewall adenopathy, iliac adenopathy with no definite retroperitoneal or mesenteric adenopathy, no ascites, no liver mets. Attempted endometrial sampling  06-26-14 and 07-03-14 was nondiagnostic; around that time the patient was passing clots and using one large maxipad hourly. She was seen by [name] 07-05-14, with uterine fundus palpable above umbilicus. She had surgery by [name] at [facility] on 07-14-14, which was exploratory laparotomy with TAH BSO, bilateral total pelvic lymphadenectomy and sampling of aortic and renal nodes. Pathology [facility] [phone]) found high grade endometrial stromal sarcoma with primary 18 cm, 8/45 nodes involved including 2 right pelvic and 6 left pelvic nodes; IHC for ER PR was to be done at [facility]. Surgery was uncomplicated and she was DC home on ~ POD #3 with lovenox x 28 days. Case was presented at [facility] multidisciplinary conference 07-23-14, with recommendation for PET CT to evaluate inguinal and portahepatis nodes, and to consider adjuvant gemzar taxotere and possibly follow with 4 cycles of adriamycin, then to consider whole pelvic RT. She saw [name] for post op follow up on 07-28-14, with recommendation for gemzar taxotere and adriamycin, whole pelvic RT after chemo and consideration of Megace maintenance after chemo. PET 08-01-14 had some uptake in aortocaval and left paraaortic regions. She had day 1 cycle 1 gemzar taxotere on 08-28-14, day 8 cycle 1 on 09-04-14 and neulasta on 09-06-14. Admitted with neutropenic fever on day 14 cycle 1.       Review of systems as above, also: No abdominal or pelvic pain. No increased swelling LE. Bladder ok. Appetite fine since mucositis resolved Remainder of 10 point Review of Systems negative.  Objective:  Vital signs in last 24 hours:  BP 134/73 mmHg  Pulse 77  Temp(Src) 98.6 F (37 C) (Oral)  Resp 20  Ht 5' (1.524 m)  Wt 228 lb 8 oz (103.647 kg)  BMI 44.63 kg/m2  SpO2 100%  LMP 02/04/2014  Alert, oriented and appropriate. Ambulatory with usual difficulty due to chronic knee problems. Alopecia  HEENT:PERRL, sclerae not icteric. Oral mucosa moist with minimal irritation  upper posterior buccal mucosa bilaterally without ulcerations or thrush, posterior pharynx clear.  Neck supple. No JVD.  Lymphatics:no cervical,supraclavicular or inguinal adenopathy Resp: clear to auscultation bilaterally and normal percussion bilaterally Cardio: regular rate and rhythm. No gallop. GI: abdomen obese, soft, nontender, not distended, no mass or organomegaly. Normally active bowel sounds. Surgical incision not remarkable. Musculoskeletal/ Extremities: without pitting edema, cords, tenderness Neuro: no peripheral neuropathy. Otherwise nonfocal Skin without rash, ecchymosis, petechiae  Portacath-without erythema or tenderness  Lab Results:  Results for orders placed or performed in visit on 09/25/14  CBC with Differential  Result Value Ref Range   WBC 10.1 3.9 - 10.3 10e3/uL   NEUT# 9.1 (H) 1.5 - 6.5 10e3/uL   HGB 10.7 (L) 11.6 - 15.9 g/dL   HCT 33.3 (L) 34.8 - 46.6 %   Platelets 445 (H) 145 - 400 10e3/uL   MCV 78.7 (L) 79.5 - 101.0 fL   MCH 25.3 25.1 - 34.0 pg   MCHC 32.1 31.5 - 36.0 g/dL   RBC 4.23 3.70 - 5.45 10e6/uL   RDW 18.3 (H) 11.2 - 14.5 %   lymph# 0.6 (L) 0.9 - 3.3 10e3/uL   MONO# 0.4 0.1 - 0.9 10e3/uL   Eosinophils Absolute 0.0 0.0 - 0.5 10e3/uL   Basophils Absolute 0.0 0.0 - 0.1 10e3/uL   NEUT% 90.0 (H) 38.4 - 76.8 %   LYMPH% 6.1 (L) 14.0 - 49.7 %   MONO% 3.8 0.0 - 14.0 %   EOS% 0.0 0.0 - 7.0 %   BASO% 0.1 0.0 - 2.0 %    CMET not remarkable  Studies/Results:  No results found.  Medications: I have reviewed the patient's current medications. Begin diflucan today for at least 3 days. Continue biotene regularly. Begin acyclovir and call if any mucositis. She will use OnPro for neulasta, which will allow that administration 24 hours earlier than we are able to give at this office.  DISCUSSION:Discussed timing of neutropenia cycle 1, meds as above. She knows to call next week if any problems prior to scheduled visit with labs on 4-28 (day 15). She is  to call if ANY concerns about the OnPro injector as she must have neulasta  Patient is concerned about thyroid disease, may want to see endocrinologist. PCP Dr Minna Antis is no longer in [place]; she has not been reassigned to another physician at that office.   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 Cycle 1 given 3-24 and 09-04-14 with neulasta 09-06-14, then admitted with neutropenic fever day 14. Will have day8 cycle 2 today, with OnPro neulasta home injection so that is given on day 9. I will see her with labs 4-28. 2. Mucositis with neutropenic fever cycle 1: candida and clinically HSV. Continue biotene,  diflucan with steroids around day 8 and other as above. 3.morbid obesity, BMI 44.6 4.post gastric banding with initial weight loss 50 lbs 5.degenerative arthritis knees: symptoms better with interventions by orthopedics prior to start of chemo 6.overdue dental care, no acute problems 7.patient stopped postop lovenox after ~ 2  of planned 4 week post op course. 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 Chemo and OnPro orders confirmed. Time spent 25 min including >50% counseling and coordination of care.   Gordy Levan, MD   09/25/2014, 12:29 PM

## 2014-09-25 NOTE — Telephone Encounter (Signed)
Appointments made and avs will be printed for patient in chemo °

## 2014-09-27 ENCOUNTER — Ambulatory Visit: Payer: Federal, State, Local not specified - PPO

## 2014-09-27 ENCOUNTER — Encounter: Payer: Self-pay | Admitting: Oncology

## 2014-09-27 ENCOUNTER — Other Ambulatory Visit: Payer: Self-pay | Admitting: Oncology

## 2014-09-27 DIAGNOSIS — Z6841 Body Mass Index (BMI) 40.0 and over, adult: Principal | ICD-10-CM

## 2014-09-29 ENCOUNTER — Telehealth: Payer: Self-pay | Admitting: Oncology

## 2014-09-29 NOTE — Telephone Encounter (Signed)
Faxed pt medical records to Dr. Mindi Curling [phone]

## 2014-09-29 NOTE — Telephone Encounter (Signed)
per pof to sch pt appt-referral-sent email to [place] records to fax to [phone]

## 2014-10-01 ENCOUNTER — Other Ambulatory Visit: Payer: Self-pay | Admitting: Oncology

## 2014-10-02 ENCOUNTER — Encounter: Payer: Self-pay | Admitting: Oncology

## 2014-10-02 ENCOUNTER — Ambulatory Visit (HOSPITAL_BASED_OUTPATIENT_CLINIC_OR_DEPARTMENT_OTHER): Payer: Federal, State, Local not specified - PPO | Admitting: Oncology

## 2014-10-02 ENCOUNTER — Telehealth: Payer: Self-pay | Admitting: Oncology

## 2014-10-02 ENCOUNTER — Other Ambulatory Visit (HOSPITAL_BASED_OUTPATIENT_CLINIC_OR_DEPARTMENT_OTHER): Payer: Federal, State, Local not specified - PPO

## 2014-10-02 VITALS — BP 133/70 | HR 99 | Temp 98.7°F | Resp 18 | Ht 60.0 in | Wt 225.7 lb

## 2014-10-02 DIAGNOSIS — D6959 Other secondary thrombocytopenia: Secondary | ICD-10-CM

## 2014-10-02 DIAGNOSIS — T451X5A Adverse effect of antineoplastic and immunosuppressive drugs, initial encounter: Secondary | ICD-10-CM

## 2014-10-02 DIAGNOSIS — C775 Secondary and unspecified malignant neoplasm of intrapelvic lymph nodes: Secondary | ICD-10-CM

## 2014-10-02 DIAGNOSIS — K1231 Oral mucositis (ulcerative) due to antineoplastic therapy: Secondary | ICD-10-CM

## 2014-10-02 DIAGNOSIS — C541 Malignant neoplasm of endometrium: Secondary | ICD-10-CM

## 2014-10-02 DIAGNOSIS — D701 Agranulocytosis secondary to cancer chemotherapy: Secondary | ICD-10-CM

## 2014-10-02 DIAGNOSIS — D500 Iron deficiency anemia secondary to blood loss (chronic): Secondary | ICD-10-CM

## 2014-10-02 DIAGNOSIS — Z95828 Presence of other vascular implants and grafts: Secondary | ICD-10-CM

## 2014-10-02 LAB — CBC WITH DIFFERENTIAL/PLATELET
BASO%: 0.6 % (ref 0.0–2.0)
Basophils Absolute: 0.1 10*3/uL (ref 0.0–0.1)
EOS%: 1.1 % (ref 0.0–7.0)
Eosinophils Absolute: 0.2 10*3/uL (ref 0.0–0.5)
HCT: 33.6 % — ABNORMAL LOW (ref 34.8–46.6)
HGB: 10.6 g/dL — ABNORMAL LOW (ref 11.6–15.9)
LYMPH%: 7.4 % — ABNORMAL LOW (ref 14.0–49.7)
MCH: 24.1 pg — ABNORMAL LOW (ref 25.1–34.0)
MCHC: 31.5 g/dL (ref 31.5–36.0)
MCV: 76.5 fL — ABNORMAL LOW (ref 79.5–101.0)
MONO#: 0.7 10*3/uL (ref 0.1–0.9)
MONO%: 4.1 % (ref 0.0–14.0)
NEUT#: 15.6 10*3/uL — ABNORMAL HIGH (ref 1.5–6.5)
NEUT%: 86.8 % — AB (ref 38.4–76.8)
Platelets: 91 10*3/uL — ABNORMAL LOW (ref 145–400)
RBC: 4.39 10*6/uL (ref 3.70–5.45)
RDW: 21.3 % — ABNORMAL HIGH (ref 11.2–14.5)
WBC: 18 10*3/uL — ABNORMAL HIGH (ref 3.9–10.3)
lymph#: 1.3 10*3/uL (ref 0.9–3.3)

## 2014-10-02 LAB — COMPREHENSIVE METABOLIC PANEL (CC13)
ALBUMIN: 3.3 g/dL — AB (ref 3.5–5.0)
ALT: 43 U/L (ref 0–55)
ANION GAP: 16 meq/L — AB (ref 3–11)
AST: 55 U/L — ABNORMAL HIGH (ref 5–34)
Alkaline Phosphatase: 131 U/L (ref 40–150)
BILIRUBIN TOTAL: 0.76 mg/dL (ref 0.20–1.20)
BUN: 10.9 mg/dL (ref 7.0–26.0)
CO2: 21 meq/L — AB (ref 22–29)
Calcium: 9.7 mg/dL (ref 8.4–10.4)
Chloride: 102 mEq/L (ref 98–109)
Creatinine: 0.9 mg/dL (ref 0.6–1.1)
EGFR: 75 mL/min/{1.73_m2} — ABNORMAL LOW (ref >90)
Glucose: 159 mg/dl — ABNORMAL HIGH (ref 70–140)
POTASSIUM: 4.2 meq/L (ref 3.5–5.1)
Sodium: 140 mEq/L (ref 136–145)
TOTAL PROTEIN: 6.6 g/dL (ref 6.4–8.3)

## 2014-10-02 MED ORDER — ACYCLOVIR 200 MG PO CAPS: 200.0000 mg | ORAL_CAPSULE | Freq: Three times a day (TID) | ORAL | Status: DC

## 2014-10-02 NOTE — Telephone Encounter (Signed)
gave and printed appt sched and for pt for May and JUNE

## 2014-10-02 NOTE — Progress Notes (Signed)
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 Cycle 1 was complicated by neutropenic fever day 14 with ANC 0.3, this despite neulasta given day 10 (= 48 hrs after day 8 taxotere, due to insurance constraints), with severe mucositis and some diarrhea then.  Patient is not feeling badly today. OnPro injector worked well at 24 hrs. She had aches on 4-25, improved with tramadol. Bowels moved 1-2z yesterday and x1 today somewhat loose but no frank diarrhea. Mouth has some mild discomfort, no esophagitis; she took diflucan ~ days of steroids around taxotere and is using biotene, but has not resumed acyclovir. She is SOB with exertion such as carrying hay bale to feed animals. She has erythema on face and rash first fingers and thumbs bilaterally just today, was outdoors with cap yesterday. She is tolerating Hemocyte well. She is somewhat fatigued, but able to do errands etc yesterday. Slight blood from nose tho tries not to blow, not using saline nose spray optimally- discussed.    PAC in No flu vaccine    ONCOLOGIC HISTORY Patient had been menopausal since age 41 until she had what seemed to her to be a menstrual period in Sept 2015, with large blood clot at completion of bleeding stopped then. She continued with lesser bleeding over next several months until she was seen in 06-2014 by [name]. Hemoglobin was 12.7 on 07-04-14. CT CAP 06-25-14 had uterus 16.4 x 13.5 x 14.4 cm with marked expansion of endometrial canal by heterogeneous mass, bilateral pelvic sidewall adenopathy, iliac adenopathy with no definite retroperitoneal or mesenteric adenopathy, no  ascites, no liver mets. Attempted endometrial sampling 06-26-14 and 07-03-14 was nondiagnostic; around that time the patient was passing clots and using one large maxipad hourly. She was seen by [name] 07-05-14, with uterine fundus palpable above umbilicus. She had surgery by [name] at [facility] on 07-14-14, which was exploratory laparotomy with TAH BSO, bilateral total pelvic lymphadenectomy and sampling of aortic and renal nodes. Pathology [facility] [phone]) found high grade endometrial stromal sarcoma with primary 18 cm, 8/45 nodes involved including 2 right pelvic and 6 left pelvic nodes; IHC for ER PR was to be done at [facility]. Surgery was uncomplicated and she was DC home on ~ POD #3 with lovenox x 28 days. Case was presented at [facility] multidisciplinary conference 07-23-14, with recommendation for PET CT to evaluate inguinal and portahepatis nodes, and to consider adjuvant gemzar taxotere and possibly follow with 4 cycles of adriamycin, then to consider whole pelvic RT. She saw [name] for post op follow up on 07-28-14, with recommendation for gemzar taxotere and adriamycin, whole pelvic RT after chemo and consideration of Megace maintenance after chemo. PET 08-01-14 had some uptake in aortocaval and left paraaortic regions. She had day 1 cycle 1 gemzar taxotere on 08-28-14, day 8 cycle 1 on 09-04-14 and neulasta on 09-06-14. Admitted with neutropenic fever on day 14 cycle 1, with oral mucositis and some diarrhea.    Review of systems as above, also: No bleeding. No cough or chest pain. Is eating and drinking fluids. No LE swelling. Chronic arthritis symptoms unchanged. Voiding ok. No abdominal or pelvic pain. No problems  with PAC Remainder of 10 point Review of Systems negative.  Objective:  Vital signs in last 24 hours:  BP 133/70 mmHg  Pulse 99  Temp(Src) 98.7 F (37.1 C) (Oral)  Resp 18  Ht 5' (1.524 m)  Wt 225 lb 11.2 oz (102.377 kg)  BMI 44.08 kg/m2  LMP 02/04/2014 Weight down 2.5 lbs Alert,  oriented and appropriate. Ambulatory without assistance.  Alopecia  HEENT:PERRL, sclerae not icteric. Oral mucosa moist without thrush, does have recurrent ulcerations on buccal mucosa at upper posterior molars bilaterally ~ 1.5 x 1 cm, posterior pharynx clear.  Neck supple. No JVD.  Lymphatics:no cervical,supraclavicular,  or inguinal adenopathy Resp: clear to auscultation bilaterally and normal percussion bilaterally Cardio: regular rate and rhythm. No gallop. GI: abdomen obese,, soft, nontender, not obviously distended, no mass or organomegaly. Normally active bowel sounds. Surgical incision not remarkable. Musculoskeletal/ Extremities: without pitting edema, cords, tenderness Neuro: no peripheral neuropathy. Otherwise nonfocal. PSYCH appropriate mood and affect Skin Face diffusely erythematous without rash, somewhat dry thruout, appears to be sunburn. Scalp without erythema. Facing sides of first fingers and thumbs bilaterally more darkly erythematous with scattered erythema dorsum of hands between 1st and thumb otherwise skin without ecchymosis, petechiae Portacath-without erythema or tenderness  Lab Results:  Results for orders placed or performed in visit on 10/02/14  CBC with Differential  Result Value Ref Range   WBC 18.0 (H) 3.9 - 10.3 10e3/uL   NEUT# 15.6 (H) 1.5 - 6.5 10e3/uL   HGB 10.6 (L) 11.6 - 15.9 g/dL   HCT 33.6 (L) 34.8 - 46.6 %   Platelets 91 (L) 145 - 400 10e3/uL   MCV 76.5 (L) 79.5 - 101.0 fL   MCH 24.1 (L) 25.1 - 34.0 pg   MCHC 31.5 31.5 - 36.0 g/dL   RBC 4.39 3.70 - 5.45 10e6/uL   RDW 21.3 (H) 11.2 - 14.5 %   lymph# 1.3 0.9 - 3.3 10e3/uL   MONO# 0.7 0.1 - 0.9 10e3/uL   Eosinophils Absolute 0.2 0.0 - 0.5 10e3/uL   Basophils Absolute 0.1 0.0 - 0.1 10e3/uL   NEUT% 86.8 (H) 38.4 - 76.8 %   LYMPH% 7.4 (L) 14.0 - 49.7 %   MONO% 4.1 0.0 - 14.0 %   EOS% 1.1 0.0 - 7.0 %   BASO% 0.6 0.0 - 2.0 %  Comprehensive metabolic panel (Cmet) - [facility]  Result Value Ref Range    Sodium 140 136 - 145 mEq/L   Potassium 4.2 3.5 - 5.1 mEq/L   Chloride 102 98 - 109 mEq/L   CO2 21 (L) 22 - 29 mEq/L   Glucose 159 (H) 70 - 140 mg/dl   BUN 10.9 7.0 - 26.0 mg/dL   Creatinine 0.9 0.6 - 1.1 mg/dL   Total Bilirubin 0.76 0.20 - 1.20 mg/dL   Alkaline Phosphatase 131 40 - 150 U/L   AST 55 (H) 5 - 34 U/L   ALT 43 0 - 55 U/L   Total Protein 6.6 6.4 - 8.3 g/dL   Albumin 3.3 (L) 3.5 - 5.0 g/dL   Calcium 9.7 8.4 - 10.4 mg/dL   Anion Gap 16 (H) 3 - 11 mEq/L   EGFR 75 (L) >90 ml/min/1.73 m2     Iron studies 08-18-14 serum iron 24 and %sat 6  CBC results noted, including chemo thrombocytopenia   Studies/Results:  No results found.  Medications: I have reviewed the patient's current medications. She is to resume acyclovir tid now and continue biotene. Continue ferrous fumarate with Vit  C  DISCUSSION: all of interval history reviewed. Increase treatment for mucositis as noted. Counts may still drop, but better thus far with neulasta given by OnPro on day 9 instead of at office on day 10.Increase interventions for mucositis. Hypoallergenic lotion to face, hydrocortisone cream to hands sparingly bid; sun block when outdoors + hat. Call if symptoms of infection, worsening of mucositis, marked fatigue prior to next scheduled visit.  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 Cycle 1 given 3-24 and 09-04-14 with neulasta 09-06-14, then admitted with neutropenic fever day 14. Cycle 2 given 4-14 and 4-21 with neulasta by OnPro on 4-22, counts ok day 15 today.  I will see her with day 1 cycle 3 on 10-09-14. 2. Mucositis with neutropenic fever cycle 1: candida and clinically HSV. Continue biotene,resume acyclovir now with tid dosing,  use diflucan with steroids around day 8. 3.morbid obesity, BMI 44.6 4.post gastric banding with initial weight loss 50  lbs 5.degenerative arthritis knees: symptoms better with interventions by orthopedics prior to start of chemo 6.sunburn on face and contact rash on hands: plan as above. 7.patient stopped postop lovenox after ~ 2 of planned 4 week post op course. 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 Consider IV iron if worsening anemia. Chemo thrombocytopenia. 12.PAC in  She knows to call if worsening mucositis, fever, significant bleeding, significant fatigue prior to next scheduled visit. Time spent 25 min including >50% counseling and coordination of care.    Erin Santiago P, MD   10/02/2014, 3:31 PM

## 2014-10-08 ENCOUNTER — Other Ambulatory Visit: Payer: Self-pay | Admitting: Oncology

## 2014-10-09 ENCOUNTER — Encounter: Payer: Self-pay | Admitting: Oncology

## 2014-10-09 ENCOUNTER — Ambulatory Visit (HOSPITAL_BASED_OUTPATIENT_CLINIC_OR_DEPARTMENT_OTHER): Payer: Federal, State, Local not specified - PPO

## 2014-10-09 ENCOUNTER — Other Ambulatory Visit (HOSPITAL_BASED_OUTPATIENT_CLINIC_OR_DEPARTMENT_OTHER): Payer: Federal, State, Local not specified - PPO

## 2014-10-09 ENCOUNTER — Telehealth: Payer: Self-pay | Admitting: Oncology

## 2014-10-09 ENCOUNTER — Ambulatory Visit (HOSPITAL_BASED_OUTPATIENT_CLINIC_OR_DEPARTMENT_OTHER): Payer: Federal, State, Local not specified - PPO | Admitting: Oncology

## 2014-10-09 VITALS — BP 134/64 | HR 82 | Temp 98.2°F | Resp 18 | Ht 60.0 in | Wt 233.1 lb

## 2014-10-09 DIAGNOSIS — D701 Agranulocytosis secondary to cancer chemotherapy: Secondary | ICD-10-CM

## 2014-10-09 DIAGNOSIS — Z5111 Encounter for antineoplastic chemotherapy: Secondary | ICD-10-CM | POA: Diagnosis not present

## 2014-10-09 DIAGNOSIS — D6959 Other secondary thrombocytopenia: Secondary | ICD-10-CM

## 2014-10-09 DIAGNOSIS — K1231 Oral mucositis (ulcerative) due to antineoplastic therapy: Secondary | ICD-10-CM

## 2014-10-09 DIAGNOSIS — D500 Iron deficiency anemia secondary to blood loss (chronic): Secondary | ICD-10-CM

## 2014-10-09 DIAGNOSIS — Z95828 Presence of other vascular implants and grafts: Secondary | ICD-10-CM

## 2014-10-09 DIAGNOSIS — C541 Malignant neoplasm of endometrium: Secondary | ICD-10-CM

## 2014-10-09 DIAGNOSIS — C775 Secondary and unspecified malignant neoplasm of intrapelvic lymph nodes: Secondary | ICD-10-CM | POA: Diagnosis not present

## 2014-10-09 DIAGNOSIS — R234 Changes in skin texture: Secondary | ICD-10-CM

## 2014-10-09 DIAGNOSIS — T451X5A Adverse effect of antineoplastic and immunosuppressive drugs, initial encounter: Secondary | ICD-10-CM

## 2014-10-09 DIAGNOSIS — R239 Unspecified skin changes: Secondary | ICD-10-CM

## 2014-10-09 LAB — COMPREHENSIVE METABOLIC PANEL (CC13)
ALK PHOS: 120 U/L (ref 40–150)
ALT: 23 U/L (ref 0–55)
AST: 29 U/L (ref 5–34)
Albumin: 3.4 g/dL — ABNORMAL LOW (ref 3.5–5.0)
Anion Gap: 11 mEq/L (ref 3–11)
BUN: 12.4 mg/dL (ref 7.0–26.0)
CALCIUM: 8.5 mg/dL (ref 8.4–10.4)
CO2: 22 mEq/L (ref 22–29)
Chloride: 109 mEq/L (ref 98–109)
Creatinine: 0.8 mg/dL (ref 0.6–1.1)
EGFR: 83 mL/min/{1.73_m2} — ABNORMAL LOW (ref >90)
Glucose: 105 mg/dl (ref 70–140)
POTASSIUM: 4.1 meq/L (ref 3.5–5.1)
SODIUM: 143 meq/L (ref 136–145)
Total Bilirubin: 0.63 mg/dL (ref 0.20–1.20)
Total Protein: 6.6 g/dL (ref 6.4–8.3)

## 2014-10-09 LAB — CBC WITH DIFFERENTIAL/PLATELET
BASO%: 0.5 % (ref 0.0–2.0)
Basophils Absolute: 0.1 10*3/uL (ref 0.0–0.1)
EOS ABS: 0.3 10*3/uL (ref 0.0–0.5)
EOS%: 1.3 % (ref 0.0–7.0)
HEMATOCRIT: 34.6 % — AB (ref 34.8–46.6)
HGB: 10.9 g/dL — ABNORMAL LOW (ref 11.6–15.9)
LYMPH%: 7.5 % — ABNORMAL LOW (ref 14.0–49.7)
MCH: 25.2 pg (ref 25.1–34.0)
MCHC: 31.5 g/dL (ref 31.5–36.0)
MCV: 80.1 fL (ref 79.5–101.0)
MONO#: 1.1 10*3/uL — AB (ref 0.1–0.9)
MONO%: 4.9 % (ref 0.0–14.0)
NEUT#: 20 10*3/uL — ABNORMAL HIGH (ref 1.5–6.5)
NEUT%: 85.8 % — ABNORMAL HIGH (ref 38.4–76.8)
PLATELETS: 343 10*3/uL (ref 145–400)
RBC: 4.32 10*6/uL (ref 3.70–5.45)
RDW: 22.7 % — AB (ref 11.2–14.5)
WBC: 23.3 10*3/uL — AB (ref 3.9–10.3)
lymph#: 1.8 10*3/uL (ref 0.9–3.3)

## 2014-10-09 MED ORDER — HEPARIN SOD (PORK) LOCK FLUSH 100 UNIT/ML IV SOLN
500.0000 [IU] | Freq: Once | INTRAVENOUS | Status: AC | PRN
  Administered 2014-10-09: 500 [IU]
  Filled 2014-10-09: qty 5

## 2014-10-09 MED ORDER — COLD PACK MISC ONCOLOGY
1.0000 | Freq: Once | Status: DC | PRN
  Filled 2014-10-09: qty 1

## 2014-10-09 MED ORDER — SODIUM CHLORIDE 0.9 % IV SOLN
900.0000 mg/m2 | Freq: Once | INTRAVENOUS | Status: AC
  Administered 2014-10-09: 1900 mg via INTRAVENOUS
  Filled 2014-10-09: qty 49.97

## 2014-10-09 MED ORDER — SODIUM CHLORIDE 0.9 % IV SOLN
Freq: Once | INTRAVENOUS | Status: AC
  Administered 2014-10-09: 12:00:00 via INTRAVENOUS

## 2014-10-09 MED ORDER — SODIUM CHLORIDE 0.9 % IJ SOLN
10.0000 mL | INTRAMUSCULAR | Status: DC | PRN
  Administered 2014-10-09: 10 mL
  Filled 2014-10-09: qty 10

## 2014-10-09 MED ORDER — SODIUM CHLORIDE 0.9 % IV SOLN
Freq: Once | INTRAVENOUS | Status: AC
  Administered 2014-10-09: 12:00:00 via INTRAVENOUS
  Filled 2014-10-09: qty 4

## 2014-10-09 NOTE — Telephone Encounter (Signed)
Gave avs & calendar for May & June °

## 2014-10-09 NOTE — Progress Notes (Signed)
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 Cycle 1 was complicated by neutropenic fever despite neulasta on day 3 (timing dictated by insurance); counts maintained thru cycle 2 with OnPro injector administering neulasta on day 2.  Patient has felt generally well in past several days, tho she is fatigued with increased exertion. She had one day with no oral ulcers and may have had some improvement in mouth/ definitely had improvement in nose with acyclovir after day 8 cycle 2. I have asked her to continue the acyclovir tid for duration of chemo. The skin reaction face and hands seems to be chemo related rather than sun or contact, as areas are desquamating now; will use ice packs to periorbital areas during chemo and have given samples of aquaphor. She has no new or different pain, bowels ok, no bleeding, no fever, no increased lacrimation.    PAC in No flu vaccine    ONCOLOGIC HISTORY Patient had been menopausal since age 59 until she had what seemed to her to be a menstrual period in Sept 2015, with large blood clot at completion of bleeding stopped then. She continued with lesser bleeding over next several months until she was seen in 06-2014 by [name]. Hemoglobin was 12.7 on 07-04-14. CT CAP 06-25-14 had uterus 16.4 x 13.5 x 14.4 cm with marked expansion of endometrial canal by heterogeneous mass, bilateral pelvic sidewall adenopathy, iliac adenopathy with no definite retroperitoneal or mesenteric adenopathy, no ascites, no liver mets. Attempted endometrial sampling 06-26-14 and 07-03-14 was nondiagnostic; around that time the patient was passing clots and using one large maxipad hourly. She was seen by [name]  07-05-14, with uterine fundus palpable above umbilicus. She had surgery by [name] at [facility] on 07-14-14, which was exploratory laparotomy with TAH BSO, bilateral total pelvic lymphadenectomy and sampling of aortic and renal nodes. 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 Surgery was uncomplicated and she was DC home on ~ POD #3 with lovenox x 28 days. Case was presented at [facility] multidisciplinary conference 07-23-14, with recommendation for PET CT to evaluate inguinal and portahepatis nodes, and to consider adjuvant gemzar taxotere and possibly follow with 4 cycles of adriamycin, then to consider whole pelvic RT. She saw [name] for post op follow up on 07-28-14, with recommendation for gemzar taxotere and adriamycin, whole pelvic RT after chemo and consideration of Megace maintenance after chemo. PET 08-01-14 had some uptake in aortocaval and left paraaortic regions. She had day 1 cycle 1 gemzar taxotere on 08-28-14, day 8 cycle 1 on 09-04-14 and neulasta on 09-06-14. Admitted with neutropenic fever on day 14 cycle 1, with oral mucositis and some diarrhea. Counts maintained cycle 2 using OnPro neulasta day 2.    Review of systems as above, also: No fever or symptoms of infection. No swelling LE. Blister on right 5th toe. No nail changes. No problems with PAC.  Remainder of 10 point Review of Systems negative.  Objective:  Vital signs in last 24 hours:  BP 134/64 mmHg  Pulse 82  Temp(Src) 98.2 F (36.8 C) (  Oral)  Resp 18  Ht 5' (1.524 m)  Wt 233 lb 1.6 oz (105.733 kg)  BMI 45.52 kg/m2  LMP 02/04/2014 Weight up 8 lbs. Alert, oriented and appropriate. Ambulatory without assistance difficulty.  Alopecia  HEENT:PERRL, sclerae not icteric. Oral mucosa moist, small areas of ulceration posterior buccal mucosa bilaterally, posterior pharynx clear.   Neck supple. No JVD.  Lymphatics:no cervical,supraclavicular adenopathy Resp: clear to auscultation bilaterally and normal percussion bilaterally Cardio: regular rate and rhythm. No gallop. GI: abdomen obese, soft, nontender, not distended, no appreciable mass or organomegaly. Normally active bowel sounds. Surgical incision not remarkable. Musculoskeletal/ Extremities: without pitting edema, cords, tenderness Neuro: no peripheral neuropathy. Otherwise nonfocal Skin without ecchymosis, petechiae. Minimal erythema face with dry desquamation, not nearly as pronounced in periorbital areas as at last visit. Blister broken at right 5th toe, does not appear infected. Nails without changes of taxotere. Thick desquamation now in area of previous rash at thumbs and first fingers bilaterally  Portacath-without erythema or tenderness  Lab Results:  Results for orders placed or performed in visit on 10/09/14  CBC with Differential  Result Value Ref Range   WBC 23.3 (H) 3.9 - 10.3 10e3/uL   NEUT# 20.0 (H) 1.5 - 6.5 10e3/uL   HGB 10.9 (L) 11.6 - 15.9 g/dL   HCT 34.6 (L) 34.8 - 46.6 %   Platelets 343 145 - 400 10e3/uL   MCV 80.1 79.5 - 101.0 fL   MCH 25.2 25.1 - 34.0 pg   MCHC 31.5 31.5 - 36.0 g/dL   RBC 4.32 3.70 - 5.45 10e6/uL   RDW 22.7 (H) 11.2 - 14.5 %   lymph# 1.8 0.9 - 3.3 10e3/uL   MONO# 1.1 (H) 0.1 - 0.9 10e3/uL   Eosinophils Absolute 0.3 0.0 - 0.5 10e3/uL   Basophils Absolute 0.1 0.0 - 0.1 10e3/uL   NEUT% 85.8 (H) 38.4 - 76.8 %   LYMPH% 7.5 (L) 14.0 - 49.7 %   MONO% 4.9 0.0 - 14.0 %   EOS% 1.3 0.0 - 7.0 %   BASO% 0.5 0.0 - 2.0 %  Comprehensive metabolic panel (Cmet) - [facility]  Result Value Ref Range   Sodium 143 136 - 145 mEq/L   Potassium 4.1 3.5 - 5.1 mEq/L   Chloride 109 98 - 109 mEq/L   CO2 22 22 - 29 mEq/L   Glucose 105 70 - 140 mg/dl   BUN 12.4 7.0 - 26.0 mg/dL   Creatinine 0.8 0.6 - 1.1 mg/dL   Total Bilirubin 0.63 0.20 - 1.20 mg/dL   Alkaline Phosphatase 120 40 - 150 U/L    AST 29 5 - 34 U/L   ALT 23 0 - 55 U/L   Total Protein 6.6 6.4 - 8.3 g/dL   Albumin 3.4 (L) 3.5 - 5.0 g/dL   Calcium 8.5 8.4 - 10.4 mg/dL   Anion Gap 11 3 - 11 mEq/L   EGFR 83 (L) >90 ml/min/1.73 m2     Studies/Results:  No results found.  Medications: I have reviewed the patient's current medications. She will continue acyclovir tid thru duration of chemo. Saline nose spray helpful, continue. Use antibiotic ointment to left 5th toe.   DISCUSSION Ice packs to periorbital areas with each chemo and also to hands at least with taxotere.  May be best to repeat scans after 4 cycles of gem taxotere given problems associated with treatment thus far.  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 Cycle 1 gemzar taxotere begun 08-28-14, course to date as noted. For day 1 cycle 3 today. She will have day 8 on 10-16-14 as long as [place] >=1.5 and plt >=100k; she needs OnPro injection on day 9. Ice packs with taxotere. I will see her with labs on 10-23-14.  2. Mucositis with neutropenic fever cycle 1: candida and clinically HSV. Continue biotene, acyclovir tid, use diflucan with steroids around day 8. This has not fully resolved. 3.morbid obesity, BMI 44.6 4.post gastric banding with initial weight loss 50 lbs 5.degenerative arthritis knees: symptoms better with interventions by orthopedics prior to start of chemo 6.skin reaction hands and face seems to be from taxotere: plan as above 7.patient stopped postop lovenox after ~ 2 of planned 4 week post op course. 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 Consider IV iron if worsening anemia. Chemo thrombocytopenia. 12.PAC in    All questions answered. Patient is in agreement with plans above and with continuing treatment. She will call if concerns prior to next scheduled visit. Chemo orders confirmed. Time spent 25 min including >50% counseling and coordination of care.   LIVESAY,LENNIS P, MD   10/09/2014, 11:26 AM

## 2014-10-09 NOTE — Patient Instructions (Signed)
[facility] Discharge Instructions for Patients Receiving Chemotherapy  Today you received the following chemotherapy agents Gemzar  To help prevent nausea and vomiting after your treatment, we encourage you to take your nausea medication as directed   If you develop nausea and vomiting that is not controlled by your nausea medication, call the clinic.   BELOW ARE SYMPTOMS THAT SHOULD BE REPORTED IMMEDIATELY:  *FEVER GREATER THAN 100.5 F  *CHILLS WITH OR WITHOUT FEVER  NAUSEA AND VOMITING THAT IS NOT CONTROLLED WITH YOUR NAUSEA MEDICATION  *UNUSUAL SHORTNESS OF BREATH  *UNUSUAL BRUISING OR BLEEDING  TENDERNESS IN MOUTH AND THROAT WITH OR WITHOUT PRESENCE OF ULCERS  *URINARY PROBLEMS  *BOWEL PROBLEMS  UNUSUAL RASH Items with * indicate a potential emergency and should be followed up as soon as possible.  Feel free to call the clinic you have any questions or concerns. The clinic phone number is [phone].    

## 2014-10-10 DIAGNOSIS — T451X5A Adverse effect of antineoplastic and immunosuppressive drugs, initial encounter: Secondary | ICD-10-CM | POA: Insufficient documentation

## 2014-10-10 DIAGNOSIS — R234 Changes in skin texture: Secondary | ICD-10-CM

## 2014-10-16 ENCOUNTER — Emergency Department (HOSPITAL_COMMUNITY)
Admission: EM | Admit: 2014-10-16 | Discharge: 2014-10-16 | Disposition: A | Discharge status: Home / Self Care | Payer: Federal, State, Local not specified - PPO | Attending: Emergency Medicine | Admitting: Emergency Medicine

## 2014-10-16 ENCOUNTER — Encounter (HOSPITAL_COMMUNITY): Payer: Self-pay

## 2014-10-16 ENCOUNTER — Other Ambulatory Visit: Payer: Self-pay | Admitting: Oncology

## 2014-10-16 ENCOUNTER — Other Ambulatory Visit (HOSPITAL_BASED_OUTPATIENT_CLINIC_OR_DEPARTMENT_OTHER): Payer: Federal, State, Local not specified - PPO

## 2014-10-16 ENCOUNTER — Ambulatory Visit: Payer: Federal, State, Local not specified - PPO

## 2014-10-16 ENCOUNTER — Encounter: Payer: Self-pay | Admitting: Oncology

## 2014-10-16 DIAGNOSIS — D6959 Other secondary thrombocytopenia: Secondary | ICD-10-CM

## 2014-10-16 DIAGNOSIS — H539 Unspecified visual disturbance: Secondary | ICD-10-CM

## 2014-10-16 DIAGNOSIS — D701 Agranulocytosis secondary to cancer chemotherapy: Secondary | ICD-10-CM | POA: Diagnosis not present

## 2014-10-16 DIAGNOSIS — Z86018 Personal history of other benign neoplasm: Secondary | ICD-10-CM | POA: Diagnosis not present

## 2014-10-16 DIAGNOSIS — C775 Secondary and unspecified malignant neoplasm of intrapelvic lymph nodes: Secondary | ICD-10-CM

## 2014-10-16 DIAGNOSIS — D649 Anemia, unspecified: Secondary | ICD-10-CM | POA: Insufficient documentation

## 2014-10-16 DIAGNOSIS — Z79899 Other long term (current) drug therapy: Secondary | ICD-10-CM | POA: Insufficient documentation

## 2014-10-16 DIAGNOSIS — Z8542 Personal history of malignant neoplasm of other parts of uterus: Secondary | ICD-10-CM | POA: Insufficient documentation

## 2014-10-16 DIAGNOSIS — C541 Malignant neoplasm of endometrium: Secondary | ICD-10-CM

## 2014-10-16 DIAGNOSIS — Z8742 Personal history of other diseases of the female genital tract: Secondary | ICD-10-CM | POA: Insufficient documentation

## 2014-10-16 DIAGNOSIS — R011 Cardiac murmur, unspecified: Secondary | ICD-10-CM | POA: Diagnosis not present

## 2014-10-16 DIAGNOSIS — H538 Other visual disturbances: Secondary | ICD-10-CM | POA: Insufficient documentation

## 2014-10-16 DIAGNOSIS — D500 Iron deficiency anemia secondary to blood loss (chronic): Secondary | ICD-10-CM

## 2014-10-16 DIAGNOSIS — Z791 Long term (current) use of non-steroidal anti-inflammatories (NSAID): Secondary | ICD-10-CM | POA: Insufficient documentation

## 2014-10-16 LAB — CBC WITH DIFFERENTIAL/PLATELET
BASO%: 0.1 % (ref 0.0–2.0)
Basophils Absolute: 0 10*3/uL (ref 0.0–0.1)
EOS ABS: 0 10*3/uL (ref 0.0–0.5)
EOS%: 0.1 % (ref 0.0–7.0)
HCT: 30.9 % — ABNORMAL LOW (ref 34.8–46.6)
HGB: 9.9 g/dL — ABNORMAL LOW (ref 11.6–15.9)
LYMPH%: 5.7 % — AB (ref 14.0–49.7)
MCH: 25.1 pg (ref 25.1–34.0)
MCHC: 32 g/dL (ref 31.5–36.0)
MCV: 78.2 fL — AB (ref 79.5–101.0)
MONO#: 0.3 10*3/uL (ref 0.1–0.9)
MONO%: 2.8 % (ref 0.0–14.0)
NEUT%: 91.3 % — ABNORMAL HIGH (ref 38.4–76.8)
NEUTROS ABS: 9.2 10*3/uL — AB (ref 1.5–6.5)
PLATELETS: 269 10*3/uL (ref 145–400)
RBC: 3.95 10*6/uL (ref 3.70–5.45)
RDW: 22.3 % — ABNORMAL HIGH (ref 11.2–14.5)
WBC: 10.1 10*3/uL (ref 3.9–10.3)
lymph#: 0.6 10*3/uL — ABNORMAL LOW (ref 0.9–3.3)

## 2014-10-16 LAB — COMPREHENSIVE METABOLIC PANEL (CC13)
ALK PHOS: 92 U/L (ref 40–150)
ALT: 60 U/L — ABNORMAL HIGH (ref 0–55)
AST: 51 U/L — ABNORMAL HIGH (ref 5–34)
Albumin: 3.4 g/dL — ABNORMAL LOW (ref 3.5–5.0)
Anion Gap: 9 mEq/L (ref 3–11)
BUN: 17.9 mg/dL (ref 7.0–26.0)
CHLORIDE: 108 meq/L (ref 98–109)
CO2: 24 meq/L (ref 22–29)
Calcium: 9 mg/dL (ref 8.4–10.4)
Creatinine: 0.8 mg/dL (ref 0.6–1.1)
EGFR: 80 mL/min/{1.73_m2} — ABNORMAL LOW (ref >90)
Glucose: 172 mg/dl — ABNORMAL HIGH (ref 70–140)
POTASSIUM: 4.1 meq/L (ref 3.5–5.1)
SODIUM: 141 meq/L (ref 136–145)
Total Bilirubin: 0.74 mg/dL (ref 0.20–1.20)
Total Protein: 6.7 g/dL (ref 6.4–8.3)

## 2014-10-16 MED ORDER — ATROPINE SULFATE 1 % OP SOLN
1.0000 [drp] | Freq: Once | OPHTHALMIC | Status: AC
  Administered 2014-10-16: 1 [drp] via OPHTHALMIC
  Filled 2014-10-16: qty 2

## 2014-10-16 MED ORDER — TETRACAINE HCL 0.5 % OP SOLN
1.0000 [drp] | Freq: Once | OPHTHALMIC | Status: AC
  Administered 2014-10-16: 1 [drp] via OPHTHALMIC
  Filled 2014-10-16: qty 2

## 2014-10-16 MED ORDER — HOMATROPINE HBR 2 % OP SOLN
1.0000 [drp] | Freq: Once | OPHTHALMIC | Status: DC
  Filled 2014-10-16: qty 5

## 2014-10-16 NOTE — ED Notes (Signed)
Pharmacy called regarding eyes drops

## 2014-10-16 NOTE — Progress Notes (Signed)
Medical Oncology  Patient at [facility] for day 8 cycle 3 gemzar taxotere. Reported to RN visual changes since ~ 10-13-14, with "halo" effect and flashing lights, tho she feels these are improving. No HA, no prior similar symptoms. She is known to Dr Lucita Ferrara for ophthalmology. Will hold chemo today. Needs to be seen as soon as possible by ophthalmology. RN to send information to that office.  Will not give additional chemo until that evaluation is complete and I see her back as scheduled on 10-23-14.  Godfrey Pick, MD

## 2014-10-16 NOTE — ED Notes (Signed)
Pt, sent by [facility], c/o intermittent vision changes x 2-3 days.  Pt reports seeing a "halo" in one eye and "flashes" in the other eye.

## 2014-10-16 NOTE — Progress Notes (Unsigned)
Pt reports "her version of migraine"  She states no pain but seeing a halo in right eye & seeing flashes of light in Left eye.  She states this started mon or tues but is getting better. She also mentions her eyes watering a little bit. Called Dr Marko Plume & she wants to make sure pt sees an ophthalmologist soon & hold treatment until she is seen again by Dr Marko Plume.  Pt reports seeing ? Optometrist in [place] but saw Dr. Carlis Abbott for lasik surgery.  She called Dr. Judithe Modest office & is waiting on return call for consult with another physician in that office.   Pt called Dr Judithe Modest office again & was told that she needs to go to the ED to be evaluated by the opthalmologist on call.  Reported to Dr Marko Plume & called ED Triage RN & given report of pt's symptoms.  Pt d/c to ED.

## 2014-10-16 NOTE — ED Notes (Signed)
Pt had chemo on Thursday.  Pt started having halo vision on Monday.  Pt was to go to treatment today but canceled d/t vision.  No other symptoms of anything.  Pt is on 3 rd set of chemo.  No radiation.  Pt has endometrial cancer.

## 2014-10-16 NOTE — ED Provider Notes (Signed)
CSN: [id number]     Arrival date & time 10/16/14  1419 History   None    Chief Complaint  Patient presents with  . Blurred Vision     (Consider location/radiation/quality/duration/timing/severity/associated sxs/prior Treatment) Patient is a 59 y.o. female presenting with eye problem. The history is provided by the patient. No language interpreter was used.  Eye Problem Location:  L eye Quality:  Aching Severity:  Moderate Onset quality:  Gradual Duration:  3 days Progression:  Worsening Chronicity:  New Relieved by:  Nothing Worsened by:  Nothing tried Associated symptoms: blurred vision   Associated symptoms: no redness   Risk factors: no conjunctival hemorrhage   Pt complains of seeing a halo 3 days ago.  Pt noticed a ballon like image with a tail that obscurred part of her vision yesterday,  Gone today but today she is seeing flashes n her left eye,  No headache and no weakness  Past Medical History  Diagnosis Date  . Abnormal uterine bleeding   . Anemia   . Fibroid   . Heart murmur     under 5 yrs of age  . Cancer     endometrial stromal sarcoma   Past Surgical History  Procedure Laterality Date  . Laparoscopic gastric sleeve resection    . Abdominal hysterectomy  07/2014    at [facility] History  Problem Relation Age of Onset  . Hypertension Mother   . Diabetes Mother   . Thyroid disease Father   . Heart attack Father   . Other Father     enlarged heart   History  Substance Use Topics  . Smoking status: Never Smoker   . Smokeless tobacco: Never Used  . Alcohol Use: No   OB History    Gravida Para Term Preterm AB TAB SAB Ectopic Multiple Living   0 0 0 0 0 0 0 0 0 0      Review of Systems  Eyes: Positive for blurred vision and visual disturbance. Negative for redness.  All other systems reviewed and are negative.     Allergies  Review of patient's allergies indicates no known allergies.  Home Medications   Prior to Admission medications    Medication Sig Start Date End Date Taking? Authorizing Provider  acyclovir (ZOVIRAX) 200 MG capsule Take 1 capsule (200 mg total) by mouth 3 (three) times daily. For mouth ulcers during chemo 10/02/14   Gordy Levan, MD  Alum & Mag Hydroxide-Simeth (MAGIC MOUTHWASH W/LIDOCAINE) SOLN QID prn for mouth/throat irriation- may swish swallow and or spit Patient not taking: Reported on 09/25/2014 09/06/14   Chauncey Cruel, MD  antiseptic oral rinse (BIOTENE) LIQD 15 mLs by Mouth Rinse route 4 (four) times daily. 09/12/14   Bonnielee Haff, MD  dexamethasone (DECADRON) 4 MG tablet Take 2 tabs with food twice a day x 3 days beginning day prior to Taxotere(Second week of Chemo) Patient not taking: Reported on 10/02/2014 09/16/14   Lennis Marion Downer, MD  docusate sodium (COLACE) 100 MG capsule Take 100 mg by mouth 2 (two) times daily.  07/17/14   Historical Provider, MD  ferrous fumarate (HEMOCYTE) 325 (106 FE) MG TABS tablet Take 1 tab daily on an empty stomach with OJ or Vitamin C 500 mg 09/15/14   Lennis Marion Downer, MD  fluconazole (DIFLUCAN) 100 MG tablet Take along with steroids aroud Taxotere Patient not taking: Reported on 10/09/2014 09/15/14   Lennis Marion Downer, MD  hydrocortisone 2.5 % cream Apply 1  application topically as needed (itch/psoriasis.).  05/19/14   Historical Provider, MD  lidocaine-prilocaine (EMLA) cream Apply to Porta-cath 1-2 hrs prior to access. Cover with [employer]. 08/18/14   Lennis Marion Downer, MD  LORazepam (ATIVAN) 1 MG tablet Place 1/2-1 tablet under the tongue or swallow every 6 hrs as needed for nausea. Will make you drowsy. Patient not taking: Reported on 09/01/2014 08/18/14   Gordy Levan, MD  meloxicam (MOBIC) 15 MG tablet Take 15 mg by mouth daily.  06/09/14   Historical Provider, MD  ondansetron (ZOFRAN ODT) 8 MG disintegrating tablet Take 1 tablet (8 mg total) by mouth every 8 (eight) hours as needed for nausea or vomiting. Patient not taking: Reported on 09/15/2014 08/18/14    Gordy Levan, MD  Psyllium 28.3 % POWD Take 1 packet by mouth daily.    Historical Provider, MD  sodium chloride (OCEAN) 0.65 % SOLN nasal spray Place 1 spray into both nostrils as needed for congestion. 09/12/14   Bonnielee Haff, MD  traMADol Veatrice Bourbon) 50 MG tablet May take 25-50 mg PO BID PRN. (1/2 to 1 whole tab) 09/08/14   Susanne Borders, NP   LMP 02/04/2014 Physical Exam  Constitutional: She appears well-developed and well-nourished.  HENT:  Head: Normocephalic.  Right Ear: External ear normal.  Left Ear: External ear normal.  Nose: Nose normal.  Eyes: Conjunctivae and EOM are normal. Pupils are equal, round, and reactive to light.  Pressure 17 left 18 right Visual accuity 25 bilat  Neck: Normal range of motion. Neck supple.  Cardiovascular: Normal rate and normal heart sounds.   Pulmonary/Chest: Effort normal.  Musculoskeletal: Normal range of motion.  Neurological: She is alert.  Skin: Skin is warm.  Psychiatric: She has a normal mood and affect.  Nursing note and vitals reviewed.   ED Course  Procedures (including critical care time) Labs Review Labs Reviewed - No data to display  Imaging Review No results found.   EKG Interpretation None      MDM  I spoke with Dr. Maylene Roes who requested pt come to his office now for evaluation   Final diagnoses:  Visual changes        Fransico Meadow, PA-C 10/16/14 [place], MD 10/16/14 1728

## 2014-10-16 NOTE — ED Notes (Signed)
Questions, concerns denied R/t dc. Pt given instruction to go to opthalmologist office

## 2014-10-16 NOTE — Discharge Instructions (Signed)
Blurred Vision °You have been seen today complaining of blurred vision. This means you have a loss of ability to see small details.  °CAUSES  °Blurred vision can be a symptom of underlying eye problems, such as: °· Aging of the eye (presbyopia). °· Glaucoma. °· Cataracts. °· Eye infection. °· Eye-related migraine. °· Diabetes mellitus. °· Fatigue. °· Migraine headaches. °· High blood pressure. °· Breakdown of the back of the eye (macular degeneration). °· Problems caused by some medications. °The most common cause of blurred vision is the need for eyeglasses or a new prescription. Today in the emergency department, no cause for your blurred vision can be found. °SYMPTOMS  °Blurred vision is the loss of visual sharpness and detail (acuity). °DIAGNOSIS  °Should blurred vision continue, you should see your caregiver. If your caregiver is your primary care physician, he or she may choose to refer you to another specialist.  °TREATMENT  °Do not ignore your blurred vision. Make sure to have it checked out to see if further treatment or referral is necessary. °SEEK MEDICAL CARE IF:  °You are unable to get into a specialist so we can help you with a referral. °SEEK IMMEDIATE MEDICAL CARE IF: °You have severe eye pain, severe headache, or sudden loss of vision. °MAKE SURE YOU:  °· Understand these instructions. °· Will watch your condition. °· Will get help right away if you are not doing well or get worse. °Document Released: 05/26/2003 Document Revised: 08/15/2011 Document Reviewed: 12/26/2007 °Exit[employer]® Patient Information ©2015 [employer]. This information is not intended to replace advice given to you by your health care provider. Make sure you discuss any questions you have with your health care provider. ° °

## 2014-10-17 ENCOUNTER — Other Ambulatory Visit: Payer: Self-pay | Admitting: Oncology

## 2014-10-17 ENCOUNTER — Telehealth: Payer: Self-pay | Admitting: *Deleted

## 2014-10-17 NOTE — Telephone Encounter (Signed)
Received VM from patient stating she saw the ophthalmologist yesterday and was medically cleared. Per Dr. Marko Plume, patient can receive chemo next week prior to seeing her on Thursday, 10/23/14. Chemo rescheduled to Monday, 10/20/14.   Called patient and let her know to start taking decadron again on Sunday, 10/19/14 prior to chemo (she has one more refill on decadron). Patient agreeable to this and confirms she will be at appt on Monday.

## 2014-10-20 ENCOUNTER — Other Ambulatory Visit: Payer: Self-pay | Admitting: Oncology

## 2014-10-20 ENCOUNTER — Other Ambulatory Visit (HOSPITAL_BASED_OUTPATIENT_CLINIC_OR_DEPARTMENT_OTHER): Payer: Federal, State, Local not specified - PPO

## 2014-10-20 ENCOUNTER — Ambulatory Visit (HOSPITAL_BASED_OUTPATIENT_CLINIC_OR_DEPARTMENT_OTHER): Payer: Federal, State, Local not specified - PPO

## 2014-10-20 ENCOUNTER — Telehealth: Payer: Self-pay | Admitting: Oncology

## 2014-10-20 VITALS — BP 162/77 | HR 93 | Temp 97.6°F | Resp 20

## 2014-10-20 DIAGNOSIS — C541 Malignant neoplasm of endometrium: Secondary | ICD-10-CM

## 2014-10-20 DIAGNOSIS — D701 Agranulocytosis secondary to cancer chemotherapy: Secondary | ICD-10-CM | POA: Diagnosis not present

## 2014-10-20 DIAGNOSIS — Z5111 Encounter for antineoplastic chemotherapy: Secondary | ICD-10-CM | POA: Diagnosis not present

## 2014-10-20 DIAGNOSIS — D500 Iron deficiency anemia secondary to blood loss (chronic): Secondary | ICD-10-CM | POA: Diagnosis not present

## 2014-10-20 DIAGNOSIS — Z5189 Encounter for other specified aftercare: Secondary | ICD-10-CM

## 2014-10-20 DIAGNOSIS — D6959 Other secondary thrombocytopenia: Secondary | ICD-10-CM | POA: Diagnosis not present

## 2014-10-20 LAB — CBC WITH DIFFERENTIAL/PLATELET
BASO%: 0.1 % (ref 0.0–2.0)
Basophils Absolute: 0 10*3/uL (ref 0.0–0.1)
EOS%: 0.1 % (ref 0.0–7.0)
Eosinophils Absolute: 0 10*3/uL (ref 0.0–0.5)
HCT: 32.8 % — ABNORMAL LOW (ref 34.8–46.6)
HGB: 10.5 g/dL — ABNORMAL LOW (ref 11.6–15.9)
LYMPH#: 0.7 10*3/uL — AB (ref 0.9–3.3)
LYMPH%: 5.2 % — ABNORMAL LOW (ref 14.0–49.7)
MCH: 25.5 pg (ref 25.1–34.0)
MCHC: 32 g/dL (ref 31.5–36.0)
MCV: 79.6 fL (ref 79.5–101.0)
MONO#: 0.4 10*3/uL (ref 0.1–0.9)
MONO%: 3 % (ref 0.0–14.0)
NEUT#: 11.5 10*3/uL — ABNORMAL HIGH (ref 1.5–6.5)
NEUT%: 91.6 % — AB (ref 38.4–76.8)
Platelets: 196 10*3/uL (ref 145–400)
RBC: 4.12 10*6/uL (ref 3.70–5.45)
RDW: 23.6 % — ABNORMAL HIGH (ref 11.2–14.5)
WBC: 12.5 10*3/uL — ABNORMAL HIGH (ref 3.9–10.3)

## 2014-10-20 LAB — COMPREHENSIVE METABOLIC PANEL (CC13)
ALT: 39 U/L (ref 0–55)
AST: 22 U/L (ref 5–34)
Albumin: 3.4 g/dL — ABNORMAL LOW (ref 3.5–5.0)
Alkaline Phosphatase: 73 U/L (ref 40–150)
Anion Gap: 11 mEq/L (ref 3–11)
BILIRUBIN TOTAL: 0.91 mg/dL (ref 0.20–1.20)
BUN: 16.5 mg/dL (ref 7.0–26.0)
CO2: 24 mEq/L (ref 22–29)
CREATININE: 0.8 mg/dL (ref 0.6–1.1)
Calcium: 8.9 mg/dL (ref 8.4–10.4)
Chloride: 106 mEq/L (ref 98–109)
EGFR: 84 mL/min/{1.73_m2} — ABNORMAL LOW (ref >90)
Glucose: 119 mg/dl (ref 70–140)
POTASSIUM: 4.3 meq/L (ref 3.5–5.1)
Sodium: 141 mEq/L (ref 136–145)
Total Protein: 6.4 g/dL (ref 6.4–8.3)

## 2014-10-20 MED ORDER — SODIUM CHLORIDE 0.9 % IV SOLN
Freq: Once | INTRAVENOUS | Status: AC
  Administered 2014-10-20: 11:00:00 via INTRAVENOUS

## 2014-10-20 MED ORDER — DOCETAXEL CHEMO INJECTION 160 MG/16ML
98.0000 mg/m2 | Freq: Once | INTRAVENOUS | Status: AC
  Administered 2014-10-20: 200 mg via INTRAVENOUS
  Filled 2014-10-20: qty 20

## 2014-10-20 MED ORDER — HEPARIN SOD (PORK) LOCK FLUSH 100 UNIT/ML IV SOLN
500.0000 [IU] | Freq: Once | INTRAVENOUS | Status: AC | PRN
  Administered 2014-10-20: 500 [IU]
  Filled 2014-10-20: qty 5

## 2014-10-20 MED ORDER — SODIUM CHLORIDE 0.9 % IV SOLN
Freq: Once | INTRAVENOUS | Status: AC
  Administered 2014-10-20: 11:00:00 via INTRAVENOUS
  Filled 2014-10-20: qty 8

## 2014-10-20 MED ORDER — SODIUM CHLORIDE 0.9 % IJ SOLN
10.0000 mL | INTRAMUSCULAR | Status: DC | PRN
  Administered 2014-10-20: 10 mL
  Filled 2014-10-20: qty 10

## 2014-10-20 MED ORDER — PEGFILGRASTIM 6 MG/0.6ML ~~LOC~~ PSKT
6.0000 mg | PREFILLED_SYRINGE | Freq: Once | SUBCUTANEOUS | Status: AC
  Administered 2014-10-20: 6 mg via SUBCUTANEOUS
  Filled 2014-10-20: qty 0.6

## 2014-10-20 MED ORDER — SODIUM CHLORIDE 0.9 % IV SOLN
900.0000 mg/m2 | Freq: Once | INTRAVENOUS | Status: AC
  Administered 2014-10-20: 1900 mg via INTRAVENOUS
  Filled 2014-10-20: qty 49.97

## 2014-10-20 NOTE — Telephone Encounter (Signed)
Called patient and she is aware of her lab today

## 2014-10-20 NOTE — Patient Instructions (Signed)
[facility] Discharge Instructions for Patients Receiving Chemotherapy  Today you received the following chemotherapy agents: Gemzar, Taxotere  To help prevent nausea and vomiting after your treatment, we encourage you to take your nausea medication as prescribed by your physician.   If you develop nausea and vomiting that is not controlled by your nausea medication, call the clinic.   BELOW ARE SYMPTOMS THAT SHOULD BE REPORTED IMMEDIATELY:  *FEVER GREATER THAN 100.5 F  *CHILLS WITH OR WITHOUT FEVER  NAUSEA AND VOMITING THAT IS NOT CONTROLLED WITH YOUR NAUSEA MEDICATION  *UNUSUAL SHORTNESS OF BREATH  *UNUSUAL BRUISING OR BLEEDING  TENDERNESS IN MOUTH AND THROAT WITH OR WITHOUT PRESENCE OF ULCERS  *URINARY PROBLEMS  *BOWEL PROBLEMS  UNUSUAL RASH Items with * indicate a potential emergency and should be followed up as soon as possible.  Feel free to call the clinic you have any questions or concerns. The clinic phone number is [phone].  Please show the [facility] at check-in to the Emergency Department and triage nurse.

## 2014-10-21 ENCOUNTER — Other Ambulatory Visit: Payer: Self-pay | Admitting: Oncology

## 2014-10-23 ENCOUNTER — Ambulatory Visit (HOSPITAL_COMMUNITY)
Admission: RE | Admit: 2014-10-23 | Discharge: 2014-10-23 | Disposition: A | Discharge status: Home / Self Care | Payer: Federal, State, Local not specified - PPO | Source: Ambulatory Visit | Attending: Oncology | Admitting: Oncology

## 2014-10-23 ENCOUNTER — Encounter: Payer: Self-pay | Admitting: Oncology

## 2014-10-23 ENCOUNTER — Other Ambulatory Visit (HOSPITAL_BASED_OUTPATIENT_CLINIC_OR_DEPARTMENT_OTHER): Payer: Federal, State, Local not specified - PPO

## 2014-10-23 ENCOUNTER — Ambulatory Visit (HOSPITAL_BASED_OUTPATIENT_CLINIC_OR_DEPARTMENT_OTHER): Payer: Federal, State, Local not specified - PPO | Admitting: Oncology

## 2014-10-23 VITALS — BP 130/66 | HR 82 | Temp 97.6°F | Resp 18 | Ht 60.0 in | Wt 238.1 lb

## 2014-10-23 DIAGNOSIS — K1231 Oral mucositis (ulcerative) due to antineoplastic therapy: Secondary | ICD-10-CM

## 2014-10-23 DIAGNOSIS — R0602 Shortness of breath: Secondary | ICD-10-CM | POA: Insufficient documentation

## 2014-10-23 DIAGNOSIS — D6959 Other secondary thrombocytopenia: Secondary | ICD-10-CM

## 2014-10-23 DIAGNOSIS — D500 Iron deficiency anemia secondary to blood loss (chronic): Secondary | ICD-10-CM | POA: Diagnosis not present

## 2014-10-23 DIAGNOSIS — Z6841 Body Mass Index (BMI) 40.0 and over, adult: Secondary | ICD-10-CM

## 2014-10-23 DIAGNOSIS — C541 Malignant neoplasm of endometrium: Secondary | ICD-10-CM

## 2014-10-23 DIAGNOSIS — Z85828 Personal history of other malignant neoplasm of skin: Secondary | ICD-10-CM

## 2014-10-23 DIAGNOSIS — R600 Localized edema: Secondary | ICD-10-CM

## 2014-10-23 DIAGNOSIS — Z95828 Presence of other vascular implants and grafts: Secondary | ICD-10-CM

## 2014-10-23 DIAGNOSIS — M7989 Other specified soft tissue disorders: Secondary | ICD-10-CM | POA: Insufficient documentation

## 2014-10-23 DIAGNOSIS — R239 Unspecified skin changes: Secondary | ICD-10-CM

## 2014-10-23 DIAGNOSIS — T451X5A Adverse effect of antineoplastic and immunosuppressive drugs, initial encounter: Secondary | ICD-10-CM

## 2014-10-23 DIAGNOSIS — R234 Changes in skin texture: Secondary | ICD-10-CM

## 2014-10-23 LAB — CBC WITH DIFFERENTIAL/PLATELET
BASO%: 0.5 % (ref 0.0–2.0)
Basophils Absolute: 0.1 10*3/uL (ref 0.0–0.1)
EOS%: 1.8 % (ref 0.0–7.0)
Eosinophils Absolute: 0.4 10*3/uL (ref 0.0–0.5)
HEMATOCRIT: 31.5 % — AB (ref 34.8–46.6)
HGB: 9.9 g/dL — ABNORMAL LOW (ref 11.6–15.9)
LYMPH%: 3.6 % — ABNORMAL LOW (ref 14.0–49.7)
MCH: 24.5 pg — ABNORMAL LOW (ref 25.1–34.0)
MCHC: 31.5 g/dL (ref 31.5–36.0)
MCV: 77.7 fL — ABNORMAL LOW (ref 79.5–101.0)
MONO#: 0.1 10*3/uL (ref 0.1–0.9)
MONO%: 0.5 % (ref 0.0–14.0)
NEUT#: 20.8 10*3/uL — ABNORMAL HIGH (ref 1.5–6.5)
NEUT%: 93.6 % — ABNORMAL HIGH (ref 38.4–76.8)
Platelets: 181 10*3/uL (ref 145–400)
RBC: 4.05 10*6/uL (ref 3.70–5.45)
RDW: 25.8 % — ABNORMAL HIGH (ref 11.2–14.5)
WBC: 22.2 10*3/uL — ABNORMAL HIGH (ref 3.9–10.3)
lymph#: 0.8 10*3/uL — ABNORMAL LOW (ref 0.9–3.3)

## 2014-10-23 LAB — COMPREHENSIVE METABOLIC PANEL (CC13)
ALK PHOS: 84 U/L (ref 40–150)
ALT: 82 U/L — ABNORMAL HIGH (ref 0–55)
ANION GAP: 10 meq/L (ref 3–11)
AST: 65 U/L — ABNORMAL HIGH (ref 5–34)
Albumin: 3.2 g/dL — ABNORMAL LOW (ref 3.5–5.0)
BILIRUBIN TOTAL: 1.08 mg/dL (ref 0.20–1.20)
BUN: 23.3 mg/dL (ref 7.0–26.0)
CO2: 25 mEq/L (ref 22–29)
CREATININE: 0.8 mg/dL (ref 0.6–1.1)
Calcium: 8.2 mg/dL — ABNORMAL LOW (ref 8.4–10.4)
Chloride: 105 mEq/L (ref 98–109)
EGFR: 86 mL/min/{1.73_m2} — ABNORMAL LOW (ref >90)
Glucose: 90 mg/dl (ref 70–140)
Potassium: 4.8 mEq/L (ref 3.5–5.1)
Sodium: 140 mEq/L (ref 136–145)
Total Protein: 5.8 g/dL — ABNORMAL LOW (ref 6.4–8.3)

## 2014-10-23 NOTE — Progress Notes (Signed)
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 Day 8 cycle 3 was delayed from 5-12 to 10-20-14 due to acute vision changes. SHe had neulasta injection by OnPro day after gem/taxotere. She was evaluated urgently by Dr Maylene Roes for ophthalmology on 10-16-14, apparently no acute retinal concerns, reportedly has cataracts. She was told to expect that the left eye would stay dilated for several days, which it has.  She has had new LE swelling just in past couple of days, without pain. She has noticed increased SOB with exertion such as walking, for past 7-10 days. She denies chest pain, cough. Facial flushing and periorbital swelling do not seem as bothersome with most recent cycle of chemo. She was too sedated with tramadol. She does not complain of nausea or fevers. She has had no overt bleeding. No mucositis symptoms.   PAC in No flu vaccine  ONCOLOGIC HISTORY Patient had been menopausal since age 65 until she had what seemed to her to be a menstrual period in Sept 2015, with large blood clot at completion of bleeding stopped then. She continued with lesser bleeding over next several months until she was seen in 06-2014 by [name]. Hemoglobin was 12.7 on 07-04-14. CT CAP 06-25-14 had uterus 16.4 x 13.5 x 14.4 cm with marked expansion of endometrial canal by heterogeneous mass, bilateral pelvic sidewall adenopathy, iliac adenopathy with no definite retroperitoneal or mesenteric adenopathy, no ascites, no liver mets. Attempted endometrial sampling 06-26-14 and 07-03-14 was nondiagnostic; around that time the patient was passing clots and using one large maxipad hourly. She was seen by [name] 07-05-14, with uterine fundus palpable above umbilicus. She had surgery by  [name] at [facility] on 07-14-14, which was exploratory laparotomy with TAH BSO, bilateral total pelvic lymphadenectomy and sampling of aortic and renal nodes. Pathology [facility] [phone]) found high grade endometrial stromal sarcoma with primary 18 cm, 8/45 nodes involved including 2 right pelvic and 6 left pelvic nodes; IHC for ER PR was to be done at [facility]. Surgery was uncomplicated and she was DC home on ~ POD #3 with lovenox x 28 days. Case was presented at [facility] multidisciplinary conference 07-23-14, with recommendation for PET CT to evaluate inguinal and portahepatis nodes, and to consider adjuvant gemzar taxotere and possibly follow with 4 cycles of adriamycin, then to consider whole pelvic RT. She saw [name] for post op follow up on 07-28-14, with recommendation for gemzar taxotere and adriamycin, whole pelvic RT after chemo and consideration of Megace maintenance after chemo. PET 08-01-14 had some uptake in aortocaval and left paraaortic regions. She had day 1 cycle 1 gemzar taxotere on 08-28-14, day 8 cycle 1 on 09-04-14 and neulasta on 09-06-14. Admitted with neutropenic fever on day 14 cycle 1, with oral mucositis and some diarrhea. Counts maintained cycle 2 using OnPro neulasta day 2.    Review of systems as above, also: No symptoms of infection. No problems with PAC. No complaints of increased lacrimation. Remainder of 10 point Review of Systems negative.  Objective:  Vital signs in last 24 hours:  BP 130/66 mmHg  Pulse 82  Temp(Src) 97.6 F (36.4 C) (Oral)  Resp 18  Ht 5' (1.524 m)  Wt 238 lb 1.6 oz (108.001 kg)  BMI 46.50 kg/m2  SpO2 100%  LMP 02/04/2014 Weight up 5 lbs. Alert, oriented and appropriate. Ambulatory without assistance. Respirations not labored at rest or with exertion in exam room. Alopecia  HEENT: left pupil dilated, right round and reactive, sclerae not icteric, no excessive tearing. Oral mucosa moist without lesions, posterior pharynx clear.  Neck supple. No JVD.   Lymphatics:no cervical,supraclavicular adenopathy Resp: clear to auscultation and normal percussion to bases bilaterally. No use of accessory muscles Cardio: regular rate and rhythm. No gallop. GI: abdomen obese, soft, nontender, not distended, no mass or organomegaly. Normally active bowel sounds. Surgical incision not remarkable. Musculoskeletal/ Extremities: 2+ swelling feet and lower legs bilaterally without cords, tenderness Neuro: no peripheral neuropathy. Otherwise nonfocal. PSYCH appropriate mood and affect Skin Face slightly erythematous without desquamation, otherwise without rash, ecchymosis, petechiae. No nail changes  Portacath-without erythema or tenderness  Lab Results:  Results for orders placed or performed in visit on 10/23/14  CBC with Differential  Result Value Ref Range   WBC 22.2 (H) 3.9 - 10.3 10e3/uL   NEUT# 20.8 (H) 1.5 - 6.5 10e3/uL   HGB 9.9 (L) 11.6 - 15.9 g/dL   HCT 31.5 (L) 34.8 - 46.6 %   Platelets 181 145 - 400 10e3/uL   MCV 77.7 (L) 79.5 - 101.0 fL   MCH 24.5 (L) 25.1 - 34.0 pg   MCHC 31.5 31.5 - 36.0 g/dL   RBC 4.05 3.70 - 5.45 10e6/uL   RDW 25.8 (H) 11.2 - 14.5 %   lymph# 0.8 (L) 0.9 - 3.3 10e3/uL   MONO# 0.1 0.1 - 0.9 10e3/uL   Eosinophils Absolute 0.4 0.0 - 0.5 10e3/uL   Basophils Absolute 0.1 0.0 - 0.1 10e3/uL   NEUT% 93.6 (H) 38.4 - 76.8 %   LYMPH% 3.6 (L) 14.0 - 49.7 %   MONO% 0.5 0.0 - 14.0 %   EOS% 1.8 0.0 - 7.0 %   BASO% 0.5 0.0 - 2.0 %  Comprehensive metabolic panel (Cmet) - [facility]  Result Value Ref Range   Sodium 140 136 - 145 mEq/L   Potassium 4.8 3.5 - 5.1 mEq/L   Chloride 105 98 - 109 mEq/L   CO2 25 22 - 29 mEq/L   Glucose 90 70 - 140 mg/dl   BUN 23.3 7.0 - 26.0 mg/dL   Creatinine 0.8 0.6 - 1.1 mg/dL   Total Bilirubin 1.08 0.20 - 1.20 mg/dL   Alkaline Phosphatase 84 40 - 150 U/L   AST 65 (H) 5 - 34 U/L   ALT 82 (H) 0 - 55 U/L   Total Protein 5.8 (L) 6.4 - 8.3 g/dL   Albumin 3.2 (L) 3.5 - 5.0 g/dL   Calcium 8.2 (L) 8.4 -  10.4 mg/dL   Anion Gap 10 3 - 11 mEq/L   EGFR 86 (L) >90 ml/min/1.73 m2     Studies/Results:  Patient sent from office for LE venous dopplers, negative for DVT bilaterally. CXR also done after visit without obvious pleural effusion or pulmonary edema to my review, final report pending.   NOTE echocardiogram 08-25-14 with EF 55-60%  Medications: I have reviewed the patient's current medications. Prophylatic acyclovir has resolved the ulcerations in nose. Continue diflucan with steroids around taxotere.  DISCUSSION: She will begin cycle 4 on schedule 10-30-14 as long as ANC >=1.5 and plt >=100k. I will see her with day 8 cycle 4 on 11-06-14. May need to hold or decrease taxotere that day for this fluid retention.  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 Cycle 1 gemzar taxotere begun 08-28-14, course to date as noted. OnPro injection day 9 has maintained counts and ice packs to face with taxotere seem helpful most recent cycle. LE swelling may be taxotere related fluid retention despite decadron, may need to change taxotere with cycle 4.  Plan scans after cycle 4. 2. Mucositis with neutropenic fever cycle 1: candida and clinically HSV. Continue biotene, acyclovir tid, use diflucan with steroids around day 8.  3.morbid obesity, BMI 44.6 4.post gastric banding with initial weight loss 50 lbs 5.degenerative arthritis knees: symptoms better with interventions by orthopedics prior to start of chemo 6.skin reaction hands and face from taxotere: better with ice packs during infusion this cycle 7.acute vision changes 10-16-14: appreciate help from ophthalmology then, no urgent concerns.  8.chronic alopecia such that hair loss with chemo is not concerning to her 9.no flu vaccine 10.history skin ca, apparently nonmelanoma 11,iron deficiency anemia related to gyn bleeding:  on ferrous fumarate in preparation that includes vit C, hgb stable at 10.6, iron stores documented low. Consider IV iron if worsening anemia. Chemo thrombocytopenia. 12.PAC in 13. Increased SOB: follow up report of CXR pending. Could be symptomatic anemia.  14. EF good by echo 08-25-14  All questions answered. Chemo orders for day 1 cycle 4 confirmed. Patient knows to call if needed prior to next scheduled visit   Time spent 30 min including >50% counseling and coordination of care.    Gordy Levan, MD   10/23/2014, 5:23 PM

## 2014-10-23 NOTE — Progress Notes (Addendum)
*  Preliminary Results* Bilateral lower extremity venous duplex completed. Bilateral lower extremities are negative for deep vein thrombosis. Venous flow is pulsatile in the right popliteal vein, suggestive of possible elevated right sided heart pressure. There is no evidence of Baker's cyst bilaterally.  Preliminary results discussed with Tammy of Dr.Livesay's office.  10/23/2014  Maudry Mayhew, RVT, RDCS, RDMS

## 2014-10-24 ENCOUNTER — Telehealth: Payer: Self-pay | Admitting: *Deleted

## 2014-10-24 MED ORDER — FUROSEMIDE 20 MG PO TABS: ORAL_TABLET | ORAL | Status: DC

## 2014-10-24 NOTE — Telephone Encounter (Signed)
-----   Message from Gordy Levan, MD sent at 10/24/2014  9:22 AM EDT ----- LE venous dopplers no DVT on 10-23-14. CXR no fluid in or around lungs.  Note recent echocardiogram good EF  Please tell her that the swelling in legs is likely from taxotere, as fluid retention can happen sometimes despite taking steroids (steroids are supposed to lessen chance of fluid retention, but sometimes it still happens).   Probably SOB with exertion is mostly from the anemia.  If still much LE swelling today, can try 1/2 of 20 mg lasix once today and the other 1/2 tablet if needed once this weekend. If swelling is better today, would not add diuretic Please have her call RN early next week to let us know how swelling and SOB are.  Can prescribe lasix 20 mEq #3 (three) tablets if still much swelling today.

## 2014-10-24 NOTE — Telephone Encounter (Signed)
Pt notified of message below. Verbalized understanding. RN will [facility].

## 2014-10-30 ENCOUNTER — Other Ambulatory Visit (HOSPITAL_BASED_OUTPATIENT_CLINIC_OR_DEPARTMENT_OTHER): Payer: Federal, State, Local not specified - PPO

## 2014-10-30 ENCOUNTER — Ambulatory Visit (HOSPITAL_BASED_OUTPATIENT_CLINIC_OR_DEPARTMENT_OTHER): Payer: Federal, State, Local not specified - PPO

## 2014-10-30 DIAGNOSIS — D6959 Other secondary thrombocytopenia: Secondary | ICD-10-CM | POA: Diagnosis not present

## 2014-10-30 DIAGNOSIS — C541 Malignant neoplasm of endometrium: Secondary | ICD-10-CM | POA: Diagnosis not present

## 2014-10-30 DIAGNOSIS — D500 Iron deficiency anemia secondary to blood loss (chronic): Secondary | ICD-10-CM

## 2014-10-30 DIAGNOSIS — Z5111 Encounter for antineoplastic chemotherapy: Secondary | ICD-10-CM

## 2014-10-30 LAB — CBC WITH DIFFERENTIAL/PLATELET
BASO%: 0.4 % (ref 0.0–2.0)
Basophils Absolute: 0.1 10*3/uL (ref 0.0–0.1)
EOS ABS: 0.2 10*3/uL (ref 0.0–0.5)
EOS%: 0.9 % (ref 0.0–7.0)
HCT: 33.1 % — ABNORMAL LOW (ref 34.8–46.6)
HGB: 10.3 g/dL — ABNORMAL LOW (ref 11.6–15.9)
LYMPH#: 1.7 10*3/uL (ref 0.9–3.3)
LYMPH%: 7.4 % — ABNORMAL LOW (ref 14.0–49.7)
MCH: 25.2 pg (ref 25.1–34.0)
MCHC: 31.1 g/dL — AB (ref 31.5–36.0)
MCV: 81.1 fL (ref 79.5–101.0)
MONO#: 2.3 10*3/uL — ABNORMAL HIGH (ref 0.1–0.9)
MONO%: 9.7 % (ref 0.0–14.0)
NEUT%: 81.6 % — ABNORMAL HIGH (ref 38.4–76.8)
NEUTROS ABS: 19.2 10*3/uL — AB (ref 1.5–6.5)
Platelets: 150 10*3/uL (ref 145–400)
RBC: 4.08 10*6/uL (ref 3.70–5.45)
RDW: 25.4 % — AB (ref 11.2–14.5)
WBC: 23.5 10*3/uL — ABNORMAL HIGH (ref 3.9–10.3)

## 2014-10-30 LAB — COMPREHENSIVE METABOLIC PANEL (CC13)
ALT: 54 U/L (ref 0–55)
AST: 60 U/L — ABNORMAL HIGH (ref 5–34)
Albumin: 3.3 g/dL — ABNORMAL LOW (ref 3.5–5.0)
Alkaline Phosphatase: 110 U/L (ref 40–150)
Anion Gap: 9 mEq/L (ref 3–11)
BUN: 15.2 mg/dL (ref 7.0–26.0)
CALCIUM: 8.5 mg/dL (ref 8.4–10.4)
CO2: 25 mEq/L (ref 22–29)
Chloride: 106 mEq/L (ref 98–109)
Creatinine: 1 mg/dL (ref 0.6–1.1)
EGFR: 61 mL/min/{1.73_m2} — AB (ref >90)
Glucose: 101 mg/dl (ref 70–140)
Potassium: 4.4 mEq/L (ref 3.5–5.1)
SODIUM: 141 meq/L (ref 136–145)
TOTAL PROTEIN: 6.1 g/dL — AB (ref 6.4–8.3)
Total Bilirubin: 1.11 mg/dL (ref 0.20–1.20)

## 2014-10-30 MED ORDER — SODIUM CHLORIDE 0.9 % IV SOLN
Freq: Once | INTRAVENOUS | Status: AC
  Administered 2014-10-30: 14:00:00 via INTRAVENOUS

## 2014-10-30 MED ORDER — SODIUM CHLORIDE 0.9 % IJ SOLN
10.0000 mL | INTRAMUSCULAR | Status: DC | PRN
  Administered 2014-10-30: 10 mL
  Filled 2014-10-30: qty 10

## 2014-10-30 MED ORDER — SODIUM CHLORIDE 0.9 % IV SOLN
Freq: Once | INTRAVENOUS | Status: AC
  Administered 2014-10-30: 14:00:00 via INTRAVENOUS
  Filled 2014-10-30: qty 4

## 2014-10-30 MED ORDER — HEPARIN SOD (PORK) LOCK FLUSH 100 UNIT/ML IV SOLN
500.0000 [IU] | Freq: Once | INTRAVENOUS | Status: AC | PRN
  Administered 2014-10-30: 500 [IU]
  Filled 2014-10-30: qty 5

## 2014-10-30 MED ORDER — SODIUM CHLORIDE 0.9 % IV SOLN
900.0000 mg/m2 | Freq: Once | INTRAVENOUS | Status: AC
  Administered 2014-10-30: 1900 mg via INTRAVENOUS
  Filled 2014-10-30: qty 49.97

## 2014-10-30 NOTE — Patient Instructions (Signed)
[facility] Discharge Instructions for Patients Receiving Chemotherapy  Today you received the following chemotherapy agents Gemzar.  To help prevent nausea and vomiting after your treatment, we encourage you to take your nausea medication.   If you develop nausea and vomiting that is not controlled by your nausea medication, call the clinic.   BELOW ARE SYMPTOMS THAT SHOULD BE REPORTED IMMEDIATELY:  *FEVER GREATER THAN 100.5 F  *CHILLS WITH OR WITHOUT FEVER  NAUSEA AND VOMITING THAT IS NOT CONTROLLED WITH YOUR NAUSEA MEDICATION  *UNUSUAL SHORTNESS OF BREATH  *UNUSUAL BRUISING OR BLEEDING  TENDERNESS IN MOUTH AND THROAT WITH OR WITHOUT PRESENCE OF ULCERS  *URINARY PROBLEMS  *BOWEL PROBLEMS  UNUSUAL RASH Items with * indicate a potential emergency and should be followed up as soon as possible.  Feel free to call the clinic you have any questions or concerns. The clinic phone number is [phone].  Please show the CHEMO ALERT CARD at check-in to the Emergency Department and triage nurse.   

## 2014-11-05 ENCOUNTER — Other Ambulatory Visit: Payer: Self-pay | Admitting: Oncology

## 2014-11-06 ENCOUNTER — Other Ambulatory Visit (HOSPITAL_BASED_OUTPATIENT_CLINIC_OR_DEPARTMENT_OTHER): Payer: Federal, State, Local not specified - PPO

## 2014-11-06 ENCOUNTER — Other Ambulatory Visit: Payer: Self-pay | Admitting: *Deleted

## 2014-11-06 ENCOUNTER — Ambulatory Visit (HOSPITAL_BASED_OUTPATIENT_CLINIC_OR_DEPARTMENT_OTHER): Payer: Federal, State, Local not specified - PPO | Admitting: Oncology

## 2014-11-06 ENCOUNTER — Telehealth: Payer: Self-pay | Admitting: Oncology

## 2014-11-06 ENCOUNTER — Encounter: Payer: Self-pay | Admitting: Oncology

## 2014-11-06 ENCOUNTER — Ambulatory Visit: Payer: Federal, State, Local not specified - PPO

## 2014-11-06 VITALS — BP 140/70 | HR 80 | Temp 97.8°F | Resp 19 | Ht 60.0 in | Wt 243.8 lb

## 2014-11-06 DIAGNOSIS — R600 Localized edema: Secondary | ICD-10-CM | POA: Diagnosis not present

## 2014-11-06 DIAGNOSIS — C541 Malignant neoplasm of endometrium: Secondary | ICD-10-CM

## 2014-11-06 DIAGNOSIS — D6481 Anemia due to antineoplastic chemotherapy: Secondary | ICD-10-CM

## 2014-11-06 DIAGNOSIS — C775 Secondary and unspecified malignant neoplasm of intrapelvic lymph nodes: Secondary | ICD-10-CM

## 2014-11-06 DIAGNOSIS — D500 Iron deficiency anemia secondary to blood loss (chronic): Secondary | ICD-10-CM

## 2014-11-06 DIAGNOSIS — R0602 Shortness of breath: Secondary | ICD-10-CM

## 2014-11-06 DIAGNOSIS — T451X5A Adverse effect of antineoplastic and immunosuppressive drugs, initial encounter: Secondary | ICD-10-CM

## 2014-11-06 LAB — CBC WITH DIFFERENTIAL/PLATELET
BASO%: 0 % (ref 0.0–2.0)
Basophils Absolute: 0 10*3/uL (ref 0.0–0.1)
EOS%: 0 % (ref 0.0–7.0)
Eosinophils Absolute: 0 10*3/uL (ref 0.0–0.5)
HCT: 30 % — ABNORMAL LOW (ref 34.8–46.6)
HGB: 9.5 g/dL — ABNORMAL LOW (ref 11.6–15.9)
LYMPH%: 5.8 % — ABNORMAL LOW (ref 14.0–49.7)
MCH: 24.8 pg — ABNORMAL LOW (ref 25.1–34.0)
MCHC: 31.8 g/dL (ref 31.5–36.0)
MCV: 78.1 fL — ABNORMAL LOW (ref 79.5–101.0)
MONO#: 1.2 10*3/uL — AB (ref 0.1–0.9)
MONO%: 7.6 % (ref 0.0–14.0)
NEUT#: 13.8 10*3/uL — ABNORMAL HIGH (ref 1.5–6.5)
NEUT%: 86.6 % — ABNORMAL HIGH (ref 38.4–76.8)
Platelets: 306 10*3/uL (ref 145–400)
RBC: 3.84 10*6/uL (ref 3.70–5.45)
RDW: 27.3 % — ABNORMAL HIGH (ref 11.2–14.5)
WBC: 15.9 10*3/uL — ABNORMAL HIGH (ref 3.9–10.3)
lymph#: 0.9 10*3/uL (ref 0.9–3.3)

## 2014-11-06 LAB — COMPREHENSIVE METABOLIC PANEL (CC13)
ALBUMIN: 2.9 g/dL — AB (ref 3.5–5.0)
ALT: 97 U/L — AB (ref 0–55)
AST: 74 U/L — AB (ref 5–34)
Alkaline Phosphatase: 65 U/L (ref 40–150)
Anion Gap: 7 mEq/L (ref 3–11)
BILIRUBIN TOTAL: 0.62 mg/dL (ref 0.20–1.20)
BUN: 20.7 mg/dL (ref 7.0–26.0)
CALCIUM: 8.5 mg/dL (ref 8.4–10.4)
CO2: 25 mEq/L (ref 22–29)
Chloride: 107 mEq/L (ref 98–109)
Creatinine: 0.8 mg/dL (ref 0.6–1.1)
EGFR: 82 mL/min/{1.73_m2} — AB (ref >90)
Glucose: 138 mg/dl (ref 70–140)
POTASSIUM: 4.3 meq/L (ref 3.5–5.1)
SODIUM: 138 meq/L (ref 136–145)
TOTAL PROTEIN: 5.8 g/dL — AB (ref 6.4–8.3)

## 2014-11-06 MED ORDER — NYSTATIN 100000 UNIT/GM EX POWD: CUTANEOUS | Status: DC

## 2014-11-06 NOTE — Telephone Encounter (Signed)
Appointments made and avs printed for patient °

## 2014-11-06 NOTE — Progress Notes (Signed)
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 She had day 1 cycle 4 gemzar on 10-30-14 and is due day 8 gemzar taxotere today, which will be held due to persistent LE swelling.   LE swelling began following cycle 3 chemotherapy, with venous dopplers negative on 10-23-14, CXR 5-20 without CHF or pleural effusions, and no improvement with lasix x2 doses. Echo 08-25-14 had good EF. Last imaging of abdomen pelvis was CT 06-2014 and PET 07-2014. She is SOB with minimal activity such as showering or feeding her dogs inside the house, and legs are heavy, making ambulation more difficult.  Differential is taxotere fluid retention vs progression in pelvis.   Skin including periorbital areas did better with ice packs used during chemo; hands are desquamating. She had diarrhea x 2-3 days beginning after I saw her on 5-19, drank pedialyte, imodium not helpful. She had 1 BM yesterday and 2 today. She has not had C diff tested. She is eating and drinking fluids. She denies cough or chest pain. No abdominal or pelvic pain.  She did begin premed steroids for taxotere yesterday as planned.   PAC in  [place] Patient had been menopausal since age 59 until she had what seemed to her to be a menstrual period in Sept 2015, with large blood clot at completion of bleeding stopped then. She continued with lesser bleeding over next several months until she was seen in 06-2014 by [name]. Hemoglobin was 12.7 on 07-04-14. CT CAP 06-25-14 had uterus 16.4 x 13.5 x 14.4 cm with marked expansion of endometrial canal by heterogeneous mass, bilateral pelvic sidewall adenopathy, iliac adenopathy with no definite retroperitoneal or mesenteric adenopathy, no ascites, no  liver mets. Attempted endometrial sampling 06-26-14 and 07-03-14 was nondiagnostic; around that time the patient was passing clots and using one large maxipad hourly. She was seen by [name] 07-05-14, with uterine fundus palpable above umbilicus. She had surgery by [name] at [facility] on 07-14-14, which was exploratory laparotomy with TAH BSO, bilateral total pelvic lymphadenectomy and sampling of aortic and renal nodes. Pathology [facility] [phone]) found high grade endometrial stromal sarcoma with primary 18 cm, 8/45 nodes involved including 2 right pelvic and 6 left pelvic nodes; IHC for ER PR was to be done at [facility]. Surgery was uncomplicated and she was DC home on ~ POD #3 with lovenox x 28 days. Case was presented at [facility] multidisciplinary conference 07-23-14, with recommendation for PET CT to evaluate inguinal and portahepatis nodes, and to consider adjuvant gemzar taxotere and possibly follow with 4 cycles of adriamycin, then to consider whole pelvic RT. She saw [name] for post op follow up on 07-28-14, with recommendation for gemzar taxotere and adriamycin, whole pelvic RT after chemo and consideration of Megace maintenance after chemo. PET 08-01-14 had some uptake in aortocaval and left paraaortic regions. She had day 1 cycle 1 gemzar taxotere on 08-28-14, day 8 cycle 1 on 09-04-14 and neulasta on 09-06-14. Admitted with neutropenic fever on day 14 cycle 1, with oral mucositis and some diarrhea. Counts maintained cycle 2 using OnPro neulasta day 2.    Review of systems as above, also: No fever or symptoms of infection. Is voiding. Sleeps in recliner, as she has done for back x years, does  not try to lie supine. No mucositis, now diligent with mouth care. Remainder of 10 point Review of Systems negative.  Objective:  Vital signs in last 24 hours: Weight up 5 lbs from 5-19 to 243. BP 140/70. HR 80 regular. Resp 19 slightly labored getting onto exam table. Temp 97.8. O2 sat 100%  Alert, oriented and  appropriate, NAD, very pleasant. Ambulatory with some difficulty with swelling in legs..  Alopecia  HEENT:PERRL, sclerae not icteric. Oral mucosa moist without lesions, posterior pharynx clear.  Neck supple. No JVD.  Lymphatics:no cervical,spuraclavicular adenopathy Resp: clear to auscultation bilaterally and normal percussion bilaterally Cardio: regular rate and rhythm. No gallop. GI: soft, nontender, not distended, no mass or organomegaly. Normally active bowel sounds. Surgical incision not remarkable Musculoskeletal/ Extremities: LE  2-3+ swelling without cords, tenderness Neuro: no peripheral neuropathy. Otherwise nonfocal Skin without rash, ecchymosis, petechiae. Slight erythema face with dryness, no periorbital swelling. Hands with dry desquamation.  Portacath-without erythema or tenderness  Lab Results:  Results for orders placed or performed in visit on 10/30/14  CBC with Differential  Result Value Ref Range   WBC 23.5 (H) 3.9 - 10.3 10e3/uL   NEUT# 19.2 (H) 1.5 - 6.5 10e3/uL   HGB 10.3 (L) 11.6 - 15.9 g/dL   HCT 33.1 (L) 34.8 - 46.6 %   Platelets 150 145 - 400 10e3/uL   MCV 81.1 79.5 - 101.0 fL   MCH 25.2 25.1 - 34.0 pg   MCHC 31.1 (L) 31.5 - 36.0 g/dL   RBC 4.08 3.70 - 5.45 10e6/uL   RDW 25.4 (H) 11.2 - 14.5 %   lymph# 1.7 0.9 - 3.3 10e3/uL   MONO# 2.3 (H) 0.1 - 0.9 10e3/uL   Eosinophils Absolute 0.2 0.0 - 0.5 10e3/uL   Basophils Absolute 0.1 0.0 - 0.1 10e3/uL   NEUT% 81.6 (H) 38.4 - 76.8 %   LYMPH% 7.4 (L) 14.0 - 49.7 %   MONO% 9.7 0.0 - 14.0 %   EOS% 0.9 0.0 - 7.0 %   BASO% 0.4 0.0 - 2.0 %  Comprehensive metabolic panel (Cmet) - [facility]  Result Value Ref Range   Sodium 141 136 - 145 mEq/L   Potassium 4.4 3.5 - 5.1 mEq/L   Chloride 106 98 - 109 mEq/L   CO2 25 22 - 29 mEq/L   Glucose 101 70 - 140 mg/dl   BUN 15.2 7.0 - 26.0 mg/dL   Creatinine 1.0 0.6 - 1.1 mg/dL   Total Bilirubin 1.11 0.20 - 1.20 mg/dL   Alkaline Phosphatase 110 40 - 150 U/L   AST 60 (H) 5 - 34  U/L   ALT 54 0 - 55 U/L   Total Protein 6.1 (L) 6.4 - 8.3 g/dL   Albumin 3.3 (L) 3.5 - 5.0 g/dL   Calcium 8.5 8.4 - 10.4 mg/dL   Anion Gap 9 3 - 11 mEq/L   EGFR 61 (L) >90 ml/min/1.73 m2     Studies/Results:  No results found.  CT AP ordered  Medications: I have reviewed the patient's current medications. Doubt further lasix will be helpful  DISCUSSION: If LE edema is taxotere related, or certainly if progression in pelvis, not appropriate to continue gemzar taxotere. Will not treat day 8 cycle 4 today (or neulasta following) and will get CT AP to reassess. I will see her back after the CT information is available. She is to call prior if anything worse or other concerns  Assessment/Plan:  1.high grade endometrial stromal sarcoma with bilateral pelvic node  involvement at [facility], pelvic lymphadenectomy and other node sampling at [facility] 07-14-14. Cycle 1 gemzar taxotere begun 08-28-14, thru day 1 cycle 4 on 10-30-14. Repeat CT AP prior to further treatment.  2.increasing LE edema: venous dopplers negative 5-19. Last taxotere 10-20-14. Recent good EF. Will not give taxotere today, CT as above 3.SOB and fatigue likely multifactorial with gemzar fatigue, anemia, LE edema. Adjust activity now. 4.morbid obesity, BMI 44.6. post gastric banding with initial weight loss 50 lbs 5.degenerative arthritis knees: symptoms better with interventions by orthopedics prior to start of chemo 6.skin reaction hands and face seems to be from taxotere: plan as above 7.PAC in 8.chronic alopecia such that hair loss with chemo is not concerning to her 9.no flu vaccine 10.history skin ca, apparently nonmelanoma 11,iron deficiency anemia related to gyn bleeding: on ferrous fumarate in preparation that includes vit C, hgb lower at 9.5 today, iron stores documented low. Consider IV iron     Patient understands discussion and is in agreement with plans as noted. Time spent 25 min including >50% counseling and coordination  of care.    LIVESAY,LENNIS P, MD   11/06/2014, 11:03 AM

## 2014-11-07 ENCOUNTER — Encounter: Payer: Self-pay | Admitting: *Deleted

## 2014-11-07 NOTE — Progress Notes (Signed)
RECEIVED A FAX FROM [facility] [place] CONCERNING A REFILL REQUEST FOR MELOXICAM. SPOKE TO DR.LIVESY'S NURSE, LOUISE ARCHAMBAULT,RN. NOTIFIED [facility] TO SEND REFILL REQUEST TO PT.'S PRIMARY CARE PHYSICIAN, DR.PANG.

## 2014-11-08 DIAGNOSIS — R0602 Shortness of breath: Secondary | ICD-10-CM | POA: Insufficient documentation

## 2014-11-08 DIAGNOSIS — T451X5A Adverse effect of antineoplastic and immunosuppressive drugs, initial encounter: Secondary | ICD-10-CM

## 2014-11-08 DIAGNOSIS — R600 Localized edema: Secondary | ICD-10-CM | POA: Insufficient documentation

## 2014-11-08 DIAGNOSIS — D6481 Anemia due to antineoplastic chemotherapy: Secondary | ICD-10-CM | POA: Insufficient documentation

## 2014-11-10 ENCOUNTER — Ambulatory Visit (HOSPITAL_COMMUNITY)
Admission: RE | Admit: 2014-11-10 | Discharge: 2014-11-10 | Disposition: A | Discharge status: Home / Self Care | Payer: Federal, State, Local not specified - PPO | Source: Ambulatory Visit | Attending: Oncology | Admitting: Oncology

## 2014-11-10 ENCOUNTER — Other Ambulatory Visit: Payer: Self-pay

## 2014-11-10 ENCOUNTER — Telehealth: Payer: Self-pay

## 2014-11-10 ENCOUNTER — Encounter (HOSPITAL_COMMUNITY): Payer: Self-pay

## 2014-11-10 DIAGNOSIS — C541 Malignant neoplasm of endometrium: Secondary | ICD-10-CM

## 2014-11-10 DIAGNOSIS — R0602 Shortness of breath: Secondary | ICD-10-CM | POA: Diagnosis present

## 2014-11-10 MED ORDER — IOHEXOL 300 MG/ML  SOLN
100.0000 mL | Freq: Once | INTRAMUSCULAR | Status: AC | PRN
  Administered 2014-11-10: 100 mL via INTRAVENOUS

## 2014-11-10 NOTE — Telephone Encounter (Signed)
Reviewed information below with Dr. Marko Plume. Able to schedule CT A/P for today at 1430 at [facility].  Added a stat CXR. Npo after 1030.  Drink contrast at 1230 and 1330. Told Ms. Porche that Dr. Marko Plume needs to see if there is anything going on in pelvis to explain fluid retention.  Will notify her of the results after Dr. Marko Plume has reviewed reports. If any worsening in symptoms, she needs to go to the ED to be evaluated.  Pt. Verbalized understanding.

## 2014-11-10 NOTE — Telephone Encounter (Signed)
Erin Santiago states that the LE leg swelling has increased over the weekend.  Her weight is up to 249 lbs.  She was 243 labs on 11-06-14 at visit. She is more  SOB with exertion.  She is not able to take care of her animals outside but able to ambulate in the house. Afebrile. She is drinking fluids.  Not urinating as much as she is taking in she states. What does Dr. Marko Plume recommend

## 2014-11-11 ENCOUNTER — Telehealth: Payer: Self-pay | Admitting: Oncology

## 2014-11-11 ENCOUNTER — Other Ambulatory Visit: Payer: Self-pay | Admitting: Oncology

## 2014-11-11 DIAGNOSIS — C541 Malignant neoplasm of endometrium: Secondary | ICD-10-CM

## 2014-11-11 DIAGNOSIS — M7989 Other specified soft tissue disorders: Secondary | ICD-10-CM

## 2014-11-11 NOTE — Telephone Encounter (Signed)
Medical Oncology  Unable to reach patient x2 re CT from 11-10-14, did try to LM on cell VM for her to call back to office.  If RN speaks with her, please tell her that the CTs show increased adenopathy in abdomen and pelvis, which is what is causing swelling in her legs. Dr Marko Plume will discuss with [name], and will need either to change chemo now or we may ask radiation oncology to see her as next step.  Dr Marko Plume is glad to speak with patient by phone if needed - RN please let me know if so.  Godfrey Pick, MD

## 2014-11-11 NOTE — Telephone Encounter (Signed)
Medical Oncology  Reached patient by phone to discuss progressive pelvic and abdominal adenopathy on CT AP 11-10-14, despite gemzar taxotere. I have explained that cancer has progressed, with adenopathy, not taxotere  causing LE swelling. We will discontinue gemzar and taxotere. I will discuss change in chemo vs RT with [name], however with rapid increase in LE swelling I will request consultation with RT now, for that input also.   Patient has gained another pound since yesterday, no increased SOB (CXR ok 6-6), no leg pain. Discussed keeping legs elevated as possible.  Request for radiation oncology consult done now.  Godfrey Pick, MD

## 2014-11-12 ENCOUNTER — Telehealth: Payer: Self-pay

## 2014-11-12 NOTE — Telephone Encounter (Signed)
Pt inquired if Dr. Marko Plume has spoken to Dr. Denman George as there may be a surgical alternative for her.  She is will to do radiation but is very interested in surgery if that is an option. Told ms. Beckles that this question will be given to Dr. Marko Plume upon her return to the office on 11-13-14.

## 2014-11-12 NOTE — Telephone Encounter (Signed)
Received refill request for meloxicam 15 mg daily as needed.  Dr. Minna Antis PCP moved away and she does not have another PCP assigned to her at this time.  Suggested that she have one assigned to take care of issues not related to her cancer. Pt. stated that she has not been using the mobic as she has not had any pain especially in her knees.  Told her that the Mobic will not be refilled at this time by Dr. Marko Plume.  Pt verbalized understanding. [facility] [place] [facility].

## 2014-11-13 ENCOUNTER — Other Ambulatory Visit: Payer: Federal, State, Local not specified - PPO

## 2014-11-13 ENCOUNTER — Ambulatory Visit: Payer: Federal, State, Local not specified - PPO | Admitting: Oncology

## 2014-11-14 NOTE — Progress Notes (Signed)
GYN Location of Tumor / Histology: high grade endometrial stromal sarcoma  Erin Santiago had been menopausal since age 59 until she had what seemed to her to be a menstrual period in Sept 2015, with large blood clot at completion of bleeding stopped then. She continued with lesser bleeding over next several months until she was seen in 06-2014 by [name].   Biopsies revealed:   07/14/14 Diagnosis:  A: Uterus, bilateral tubes and ovaries, hysterectomy and bilateral  salpingo-oophorectomy    Histologic type: Endometrial stromal sarcoma with focal smooth muscle  differentiation and focal myxoid   change     Histologic grade: High grade    Tumor site: Posterior myometrium    Tumor size (gross): 18 cm in greatest dimension     Serosal involvement: Not involved, tumor is 1 mm from the serosal surface      Cervical involvement: Not involved    Other involved sites: No other sites are involved    Cervical margin and distance: Widely negative, at least 3.2 cm     Lymphovascular space invasion: Present     Regional lymph nodes (from parts C-E):  Total number involved: 8  Total number examined: 45    Additional pathologic findings:  - Endometrium: Inactive appearing endometrium with cystic atrophy  - Right ovary: Epithelial inclusion cysts with associated calcifications and  physiologic changes  - Left ovary: Epithelial inclusion cysts with associated calcifications and  physiologic changes   - Right fallopian tube: Unremarkable fallopian tube with paratubal  endosalpingiosis and Walthard nests (see   comment)  - Left fallopian tube: No specific pathologic abnormality     AJCC Pathologic Stage:pT1bpN1  FIGO (2009 classification) Stage Grouping:IIIC  Note:This pathologic stage  assessment is based on information available at the  time of this report, and is subject to change pending clinical review and  additional information.  Best block for future studies:A12    B: Cervix, trachelectomy   -Chronic cervicitis with squamous metaplasia and reactive epithelial changes   -No malignancy identified    C: Lymph node, right pelvic, removal   -Two of thirteen lymph nodes positive for metastatic sarcoma (2/13)    D: Lymph node, left pelvic, removal   -Six of twenty four lymph nodes positive for metastatic sarcoma (6/24)    E: Lymph node, periaortic, removal   -Eight lymph nodes negative for metastatic sarcoma (0/8)    F: Omentum, biopsy   -Omentum with no malignancy identified    07/01/14 Diagnosis Endometrium, biopsy - BLOOD, FIBRIN AND SCATTERED INFLAMMATORY CELLS. - ENDOMETRIAL TISSUE IS NOT IDENTIFIED. - SEE COMMENT.  06/26/14 Diagnosis Endometrium, biopsy - THE SPECIMEN CONSISTS ALMOST ENTIRELY OF MUCIN AND BLOOD, A NONDIAGNOSTIC PICTURE. - SEE COMMENT.  Past/Anticipated interventions by Gyn/Onc surgery, if any: TAH BSO, pelvic lymphadenectomy and other node sampling at [facility] 07-14-14  Past/Anticipated interventions by medical oncology, if any: Cycle 1 gemzar taxotere begun 08-28-14, thru day 1 cycle 4 on 10-30-14.  Weight changes, if any: has gained 25 lbs since 09/25/14.  Bowel/Bladder complaints, if any: denies bowel and bladder issues.  Nausea/Vomiting, if any: no  Pain issues, if any:  Has arthritis in both knees.  SAFETY ISSUES:  Prior radiation? no  Pacemaker/ICD? no  Possible current pregnancy? no  Is the patient on methotrexate? no  Current Complaints / other details:  CT of abdomen/pelvis from 11/10/14 shows increased adenopathy in abdomen and pelvis, which is what is causing swelling in her legs.  Per Dr. Marko Plume, [place] and  taxotere has been discontinued.   Reports having weight gain from fluid in  her legs.  She reports having to swing her legs out to walk.  She has edema in both legs, left worse than right.  BP 120/69 mmHg  Pulse 76  Temp(Src) 98.1 F (36.7 C) (Oral)  Resp 20  Ht 5' (1.524 m)  Wt 253 lb 11.2 oz (115.078 kg)  BMI 49.55 kg/m2  SpO2 100%  LMP 02/04/2014

## 2014-11-18 ENCOUNTER — Encounter: Payer: Self-pay | Admitting: Radiation Oncology

## 2014-11-18 ENCOUNTER — Ambulatory Visit
Admission: RE | Admit: 2014-11-18 | Discharge: 2014-11-18 | Disposition: A | Discharge status: Home / Self Care | Payer: Federal, State, Local not specified - PPO | Source: Ambulatory Visit | Attending: Radiation Oncology | Admitting: Radiation Oncology

## 2014-11-18 VITALS — BP 120/69 | HR 76 | Temp 98.1°F | Resp 20 | Ht 60.0 in | Wt 253.7 lb

## 2014-11-18 DIAGNOSIS — C772 Secondary and unspecified malignant neoplasm of intra-abdominal lymph nodes: Secondary | ICD-10-CM | POA: Diagnosis not present

## 2014-11-18 DIAGNOSIS — C541 Malignant neoplasm of endometrium: Secondary | ICD-10-CM | POA: Insufficient documentation

## 2014-11-18 NOTE — Progress Notes (Signed)
Radiation Oncology         [id number]) [phone] ________________________________  Initial outpatient Consultation  Name: Erin Santiago MRN: [id number]  Date: 11/18/2014  DOB: 1955/09/26  OE:VOJJ,KKXFGHW, MD  Gordy Levan, MD   REFERRING PHYSICIAN: Gordy Levan, MD  DIAGNOSIS: Endometrial stromal sarcoma, stage III-C (T2b, N1, M0)  HISTORY OF PRESENT ILLNESS::Erin Santiago is a 59 y.o. female who is who is seen out courtesy of Dr. Marko Plume for an opinion concerning radiation therapy as part of management of patient's progressive endometrial stromal sarcoma. The patient initially presented with postmenopausal vaginal bleeding. Patient ultimately underwent a CT scan which revealed an enlarged uterus at 16.4 x 13.5 x 14.4 cm. Attempted endometrial sampling was nondiagnostic. The patient ultimately underwent TAH/BSO bilateral total pelvic lymphadenectomy and sampling of the aortic and renal nodes. Patient was found to have high-grade endometrial stromal sarcoma with primary 18 cm in size, 8 out of 45 lymph nodes showed metastasis to within the right pelvis and 6 within the left pelvis. Patient had an uneventful postoperative course and proceeded to undergo chemotherapy as recommended by the [facility] multidisciplinary conference. More recently the patient is been developing swelling in her lower extremities. A repeat CT scan shows progressive lymphadenopathy for which the patient is now seen to consider palliative radiation therapy.  PREVIOUS RADIATION THERAPY: No  PAST MEDICAL HISTORY:  has a past medical history of Abnormal uterine bleeding; Anemia; Fibroid; Heart murmur; and Cancer (dx'd 07/2014).    PAST SURGICAL HISTORY: Past Surgical History  Procedure Laterality Date  . Laparoscopic gastric sleeve resection    . Abdominal hysterectomy  07/2014    at [facility]: family history includes Diabetes in her mother; Heart attack in her father; Hypertension in her mother; Non-Hodgkin's  lymphoma (age of onset: 60) in her mother; Other in her father; Thyroid disease in her father.  SOCIAL HISTORY:  reports that she has never smoked. She has never used smokeless tobacco. She reports that she does not drink alcohol or use illicit drugs.  ALLERGIES: Review of patient's allergies indicates no known allergies.  MEDICATIONS:  Current Outpatient Prescriptions  Medication Sig Dispense Refill  . hydrocortisone 2.5 % cream Apply 1 application topically as needed (itch/psoriasis.).     Marland Kitchen lidocaine-prilocaine (EMLA) cream Apply to Porta-cath 1-2 hrs prior to access. Cover with [employer]. 30 g 1  . naproxen sodium (ANAPROX) 220 MG tablet Take 220 mg by mouth 2 (two) times daily with a meal.    . nystatin (MYCOSTATIN/NYSTOP) 100000 UNIT/GM POWD To yeast skin rash twice a day 1 Bottle 2  . sodium chloride (OCEAN) 0.65 % SOLN nasal spray Place 1 spray into both nostrils as needed for congestion. 30 mL 0  . acyclovir (ZOVIRAX) 200 MG capsule Take 1 capsule (200 mg total) by mouth 3 (three) times daily. For mouth ulcers during chemo (Patient not taking: Reported on 11/18/2014) 60 capsule 1  . Alum & Mag Hydroxide-Simeth (MAGIC MOUTHWASH W/LIDOCAINE) SOLN QID prn for mouth/throat irriation- may swish swallow and or spit (Patient not taking: Reported on 09/25/2014) 240 mL 0  . antiseptic oral rinse (BIOTENE) LIQD 15 mLs by Mouth Rinse route 4 (four) times daily. (Patient not taking: Reported on 11/18/2014) 237 mL 0  . dexamethasone (DECADRON) 4 MG tablet Take 2 tabs with food twice a day x 3 days beginning day prior to Taxotere(Second week of Chemo) (Patient not taking: Reported on 11/18/2014) 12 tablet 2  . docusate sodium (COLACE) 100 MG  capsule Take 100 mg by mouth 2 (two) times daily.     . ferrous fumarate (HEMOCYTE) 325 (106 FE) MG TABS tablet Take 1 tab daily on an empty stomach with OJ or Vitamin C 500 mg (Patient not taking: Reported on 11/18/2014) 30 each 3  . fluconazole (DIFLUCAN) 100 MG  tablet Take along with steroids aroud Taxotere (Patient not taking: Reported on 11/18/2014) 12 tablet 1  . furosemide (LASIX) 20 MG tablet Take 1/2 tablet today for LE swelling, can take 1/2 tablet this weekend if swelling is not better. (Patient not taking: Reported on 11/18/2014) 3 tablet 0  . LORazepam (ATIVAN) 1 MG tablet Place 1/2-1 tablet under the tongue or swallow every 6 hrs as needed for nausea. Will make you drowsy. (Patient not taking: Reported on 09/01/2014) 20 tablet 0  . meloxicam (MOBIC) 15 MG tablet Take 15 mg by mouth daily.   3  . ondansetron (ZOFRAN ODT) 8 MG disintegrating tablet Take 1 tablet (8 mg total) by mouth every 8 (eight) hours as needed for nausea or vomiting. (Patient not taking: Reported on 09/15/2014) 30 tablet 1  . PRESCRIPTION MEDICATION     . Psyllium 28.3 % POWD Take 1 packet by mouth daily as needed.     . traMADol (ULTRAM) 50 MG tablet May take 25-50 mg PO BID PRN. (1/2 to 1 whole tab) (Patient not taking: Reported on 11/18/2014) 20 tablet 0   No current facility-administered medications for this encounter.    REVIEW OF SYSTEMS:  A 15 point review of systems is documented in the electronic medical record. This was obtained by the nursing staff. However, I reviewed this with the patient to discuss relevant findings and make appropriate changes. Progressive swelling in both lower extremities. This is making it difficult for the patient to ambulate and is causing discomfort in both lower extremities. Patient denies any vaginal bleeding or rectal bleeding.   PHYSICAL EXAM:  height is 5' (1.524 m) and weight is 253 lb 11.2 oz (115.078 kg). Her oral temperature is 98.1 F (36.7 C). Her blood pressure is 120/69 and her pulse is 76. Her respiration is 20 and oxygen saturation is 100%.   BP 120/69 mmHg  Pulse 76  Temp(Src) 98.1 F (36.7 C) (Oral)  Resp 20  Ht 5' (1.524 m)  Wt 253 lb 11.2 oz (115.078 kg)  BMI 49.55 kg/m2  SpO2 100%  LMP 02/04/2014  General  Appearance:    Alert, cooperative, no distress, appears stated age  Head:    Normocephalic, without obvious abnormality, atraumatic  Eyes:    PERRL, conjunctiva/corneas clear, EOM's intact,      Ears:    Normal TM's and external ear canals, both ears  Nose:   Nares normal, septum midline, mucosa normal, no drainage    or sinus tenderness  Throat:   Lips, mucosa, and tongue normal; teeth and gums normal  Neck:   Supple, symmetrical, trachea midline, no adenopathy;    thyroid:  no enlargement/tenderness/nodules; no carotid   bruit or JVD  Back:     Symmetric, no curvature, ROM normal, no CVA tenderness  Lungs:     Clear to auscultation bilaterally, respirations unlabored  Chest Wall:    No tenderness or deformity   Heart:    Regular rate and rhythm, S1 and S2 normal, no murmur, rub   or gallop     Abdomen:     Soft, non-tender, bowel sounds active all four quadrants,    no masses, no  organomegaly, long vertical scar. Small scars present from prior gastric surgery for morbid obesity   Genitalia:    Normal female without lesion, discharge or tenderness, on speculum exam no lesions are noted within the vaginal vault.      Extremities:   Extremities normal, atraumatic, no cyanosis , pitting edema in both lower extremities. No obvious signs of infection.   Pulses:   2+ and symmetric all extremities  Skin:   Skin color, texture, turgor normal, no rashes or lesions  Lymph nodes:   Cervical, supraclavicular, and axillary nodes normal  Neurologic:   normal strength, sensation and reflexes    throughout     ECOG = 1  0 - Asymptomatic (Fully active, able to carry on all predisease activities without restriction)    LABORATORY DATA:  Lab Results  Component Value Date   WBC 15.9* 11/06/2014   HGB 9.5* 11/06/2014   HCT 30.0* 11/06/2014   MCV 78.1* 11/06/2014   PLT 306 11/06/2014   NEUTROABS 13.8* 11/06/2014   Lab Results  Component Value Date   NA 138 11/06/2014   K 4.3 11/06/2014    CL 108 09/18/2014   CO2 25 11/06/2014   GLUCOSE 138 11/06/2014   CREATININE 0.8 11/06/2014   CALCIUM 8.5 11/06/2014      RADIOGRAPHY: Dg Chest 2 View  11/10/2014   CLINICAL DATA:  Endometrial stromal sarcoma.  Shortness of breath.  EXAM: CHEST  2 VIEW  COMPARISON:  Chest x-rays dated 10/23/2014 and 07/22/2010 and PET-CT dated 08/01/2014  FINDINGS: Power port appears in good position, unchanged. Heart size and pulmonary vascularity are normal. No infiltrates or effusions. Minimal linear scarring the lingula. 2 mm nodule seen in the left midzone laterally is unchanged since 2012.  No effusions.  No osseous abnormality.  IMPRESSION: No active cardiopulmonary disease. No evidence of metastatic disease.   Electronically Signed   By: Lorriane Shire M.D.   On: 11/10/2014 14:43   Dg Chest 2 View  10/24/2014   CLINICAL DATA:  Shortness of breath with exertion, BILATERAL leg swelling, endometrial stromal sarcoma undergoing chemotherapy  EXAM: CHEST  2 VIEW  COMPARISON:  09/10/2014 chest radiograph; correlation PET-CT 08/01/2014  FINDINGS: RIGHT jugular Port-A-Cath with tip projecting over mid SVC.  Mild enlargement of cardiac silhouette.  Mediastinal contours and pulmonary vascularity normal.  Minimal peribronchial thickening without infiltrate, pleural effusion or pneumothorax.  No acute osseous findings.  Tiny nodular focus lateral mid LEFT lung, unchanged from prior PET-CT, question tiny granuloma.  IMPRESSION: No acute abnormalities.  Mild enlargement of cardiac silhouette.   Electronically Signed   By: Lavonia Dana M.D.   On: 10/24/2014 08:16   Ct Abdomen Pelvis W Contrast  11/10/2014   CLINICAL DATA:  Restaging endometrial sarcoma, ongoing chemotherapy. Lower extremity swelling.  EXAM: CT ABDOMEN AND PELVIS WITH CONTRAST  TECHNIQUE: Multidetector CT imaging of the abdomen and pelvis was performed using the standard protocol following bolus administration of intravenous contrast.  CONTRAST:  166mL OMNIPAQUE  IOHEXOL 300 MG/ML  SOLN  COMPARISON:  PET 08/01/2014 and CT abdomen pelvis 06/25/2014.  FINDINGS: Lower chest: Lung bases show no acute findings. Scattered pulmonary parenchymal scarring. Heart is at the upper limits of normal in size. No pericardial or pleural effusion.  Hepatobiliary: Liver and gallbladder are unremarkable. No biliary ductal dilatation.  Pancreas: Negative.  Spleen: Negative.  Adrenals/Urinary Tract: Adrenal glands are unremarkable. Right kidney appears non rotated. Kidneys are otherwise unremarkable. Ureters are decompressed. Bladder is unremarkable.  Stomach/Bowel: Postoperative changes  of gastric sleeve are noted. Small bowel, appendix and colon are unremarkable.  Vascular/Lymphatic: Minimal atherosclerotic calcification of the arterial vasculature. Aortocaval adenopathy appears necrotic, measuring up to 2.8 x 2.9 cm (series 2, image 49), previously 5 mm in short axis. Adenopathy extends along the iliac chains bilaterally, left greater than right. Index left common iliac lymph node measures 1.8 cm (series 2, image 58), previously 7 mm. Index left internal iliac lymph node measures approximately 1.5 x 2.6 cm (series 2, image 75), previously 10 mm in short axis.  Reproductive: Hysterectomy.  Ovaries are not readily visualized.  Other: A bilobed fluid collection in the left anatomic pelvis measures 7.2 x 10.3 cm, new. Small amount of fluid is seen adjacent to the cecum (series 2, image 74). Mesenteries and peritoneum are otherwise unremarkable. Postoperative changes along the midline ventral abdominal pelvic wall.  Musculoskeletal: No worrisome lytic or sclerotic lesions. Degenerative changes are seen in the spine.  IMPRESSION: 1. Interval hysterectomy with progressive metastatic abdominal and pelvic retroperitoneal adenopathy. 2. Large loculated fluid collection in the left anatomic pelvis may represent a postoperative seroma. Peritoneal metastatic disease is not excluded. 3. Small amount of  fluid adjacent to the cecum, likely postoperative.   Electronically Signed   By: Lorin Picket M.D.   On: 11/10/2014 15:57      IMPRESSION: Endometrial stromal sarcoma, stage III-C (T2b, N1, M0). Patient has progressive disease as documented above. The lymph node progression in the pelvic nodal regions may be causing the patient's lymphedema in her lower extremities. Patient would be a candidate for palliative radiation therapy directed at these areas of recurrence. I discussed the treatment course side effects and potential toxicities of radiation therapy in this situation with the patient. She appears to understand and wishes to proceed with planned course of treatment.  PLAN: Simulation and planning in the near future with treatments to begin soon afterwards. Anticipate approximately 5 weeks of radiation therapy directed at the nodal areas of recurrence. The patient will be treated with IMRT to limit dose to small bowel and pelvic bony areas.  ------------------------------------------------  Blair Promise, PhD, MD

## 2014-11-18 NOTE — Progress Notes (Signed)
Please see the Nurse Progress Note in the MD Initial Consult Encounter for this patient. 

## 2014-11-19 ENCOUNTER — Ambulatory Visit
Admission: RE | Admit: 2014-11-19 | Discharge: 2014-11-19 | Disposition: A | Discharge status: Home / Self Care | Payer: Federal, State, Local not specified - PPO | Source: Ambulatory Visit | Attending: Radiation Oncology | Admitting: Radiation Oncology

## 2014-11-19 VITALS — BP 123/55 | HR 80 | Temp 98.5°F | Resp 12 | Wt 252.6 lb

## 2014-11-19 DIAGNOSIS — C541 Malignant neoplasm of endometrium: Secondary | ICD-10-CM

## 2014-11-19 MED ORDER — SODIUM CHLORIDE 0.9 % IJ SOLN: 10.0000 mL | INTRAMUSCULAR | Status: DC | PRN

## 2014-11-19 MED ORDER — SODIUM CHLORIDE 0.9 % IJ SOLN
10.0000 mL | INTRAMUSCULAR | Status: DC | PRN
  Administered 2014-11-19 (×2): 10 mL via INTRAVENOUS

## 2014-11-19 MED ORDER — HEPARIN SOD (PORK) LOCK FLUSH 100 UNIT/ML IV SOLN
500.0000 [IU] | Freq: Once | INTRAVENOUS | Status: AC
  Administered 2014-11-19: 500 [IU] via INTRAVENOUS

## 2014-11-24 ENCOUNTER — Other Ambulatory Visit: Payer: Self-pay | Admitting: Oncology

## 2014-11-25 DIAGNOSIS — C541 Malignant neoplasm of endometrium: Secondary | ICD-10-CM | POA: Diagnosis not present

## 2014-11-27 ENCOUNTER — Encounter: Payer: Self-pay | Admitting: Oncology

## 2014-11-27 ENCOUNTER — Ambulatory Visit (HOSPITAL_BASED_OUTPATIENT_CLINIC_OR_DEPARTMENT_OTHER): Payer: Federal, State, Local not specified - PPO | Admitting: Oncology

## 2014-11-27 ENCOUNTER — Other Ambulatory Visit (HOSPITAL_BASED_OUTPATIENT_CLINIC_OR_DEPARTMENT_OTHER): Payer: Federal, State, Local not specified - PPO

## 2014-11-27 VITALS — BP 128/70 | HR 91 | Temp 98.2°F | Resp 18 | Ht 60.0 in | Wt 247.3 lb

## 2014-11-27 DIAGNOSIS — R600 Localized edema: Secondary | ICD-10-CM

## 2014-11-27 DIAGNOSIS — C541 Malignant neoplasm of endometrium: Secondary | ICD-10-CM

## 2014-11-27 DIAGNOSIS — D500 Iron deficiency anemia secondary to blood loss (chronic): Secondary | ICD-10-CM | POA: Diagnosis not present

## 2014-11-27 DIAGNOSIS — R0602 Shortness of breath: Secondary | ICD-10-CM

## 2014-11-27 DIAGNOSIS — D6481 Anemia due to antineoplastic chemotherapy: Secondary | ICD-10-CM

## 2014-11-27 DIAGNOSIS — Z6841 Body Mass Index (BMI) 40.0 and over, adult: Secondary | ICD-10-CM

## 2014-11-27 DIAGNOSIS — C772 Secondary and unspecified malignant neoplasm of intra-abdominal lymph nodes: Secondary | ICD-10-CM

## 2014-11-27 DIAGNOSIS — Z95828 Presence of other vascular implants and grafts: Secondary | ICD-10-CM

## 2014-11-27 DIAGNOSIS — T451X5A Adverse effect of antineoplastic and immunosuppressive drugs, initial encounter: Secondary | ICD-10-CM

## 2014-11-27 LAB — COMPREHENSIVE METABOLIC PANEL (CC13)
ALBUMIN: 3.5 g/dL (ref 3.5–5.0)
ALK PHOS: 60 U/L (ref 40–150)
ALT: 20 U/L (ref 0–55)
AST: 27 U/L (ref 5–34)
Anion Gap: 8 mEq/L (ref 3–11)
BUN: 13.2 mg/dL (ref 7.0–26.0)
CALCIUM: 8.9 mg/dL (ref 8.4–10.4)
CHLORIDE: 107 meq/L (ref 98–109)
CO2: 28 meq/L (ref 22–29)
Creatinine: 0.7 mg/dL (ref 0.6–1.1)
GLUCOSE: 95 mg/dL (ref 70–140)
POTASSIUM: 4.4 meq/L (ref 3.5–5.1)
SODIUM: 142 meq/L (ref 136–145)
TOTAL PROTEIN: 6.3 g/dL — AB (ref 6.4–8.3)
Total Bilirubin: 1.31 mg/dL — ABNORMAL HIGH (ref 0.20–1.20)

## 2014-11-27 LAB — CBC WITH DIFFERENTIAL/PLATELET
BASO%: 1.4 % (ref 0.0–2.0)
Basophils Absolute: 0.1 10*3/uL (ref 0.0–0.1)
EOS%: 9.1 % — ABNORMAL HIGH (ref 0.0–7.0)
Eosinophils Absolute: 0.6 10*3/uL — ABNORMAL HIGH (ref 0.0–0.5)
HCT: 36.9 % (ref 34.8–46.6)
HGB: 11.3 g/dL — ABNORMAL LOW (ref 11.6–15.9)
LYMPH#: 1 10*3/uL (ref 0.9–3.3)
LYMPH%: 15 % (ref 14.0–49.7)
MCH: 26.2 pg (ref 25.1–34.0)
MCHC: 30.6 g/dL — AB (ref 31.5–36.0)
MCV: 85.4 fL (ref 79.5–101.0)
MONO#: 0.6 10*3/uL (ref 0.1–0.9)
MONO%: 9.2 % (ref 0.0–14.0)
NEUT#: 4.3 10*3/uL (ref 1.5–6.5)
NEUT%: 65.3 % (ref 38.4–76.8)
Platelets: 348 10*3/uL (ref 145–400)
RBC: 4.32 10*6/uL (ref 3.70–5.45)
RDW: 21.5 % — ABNORMAL HIGH (ref 11.2–14.5)
WBC: 6.6 10*3/uL (ref 3.9–10.3)

## 2014-11-27 NOTE — Progress Notes (Signed)
OFFICE PROGRESS NOTE   November 27, 2014   Physicians:Emma Ellyn Hack, Delfino Lovett, MD (PCP); [name]; [name], [name]  [facility] pharmacist also present at this visit.  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 Day 8 cycle 4 gem taxotere was held on 11-06-14 due to progressive LE swelling. CT AP 11-10-14 documented progressive adenopathy and pelvic fluid. She is to begin IMRT by Dr Sondra Come on 12-01-14. [name] has been updated on situation, is in agreement with the IMRT due to symptomatic LE swelling, would consider additional second line systemic treatment after radiation if patient wants to continue treatment. Patient would like consultation at sarcoma clinic at tertiary care in this area, which we will help arrange.  Patient has felt stronger and has had less SOB in past week, with slight improvement in LE swelling. She is able to do more care for her animals with improvement in symptoms. She has some increased discomfort at knees with swelling, but otherwise no pain. Bowels are moving and she does not have nausea or vomiting. She has had no bleeding.      PAC in LE venous dopplers 10-23-14 negative for DVT Echocardiogram 08-25-14 had EF 55-60%.   ONCOLOGIC HISTORY Patient had been menopausal since age 37 until she had what seemed to her to be a menstrual period in Sept 2015, with large blood clot at completion of bleeding stopped then. She continued with lesser bleeding over next several months until she was seen in 06-2014 by [name]. Hemoglobin was 12.7 on 07-04-14. CT CAP 06-25-14 had uterus 16.4 x 13.5 x 14.4 cm with marked expansion of endometrial canal by heterogeneous mass, bilateral pelvic sidewall adenopathy, iliac adenopathy with no definite retroperitoneal or mesenteric adenopathy, no ascites, no liver mets. Attempted endometrial sampling 06-26-14 and 07-03-14  was nondiagnostic; around that time the patient was passing clots and using one large maxipad hourly. She was seen by [name] 07-05-14, with uterine fundus palpable above umbilicus. She had surgery by [name] at [facility] on 07-14-14, which was exploratory laparotomy with TAH BSO, bilateral total pelvic lymphadenectomy and sampling of aortic and renal nodes. Pathology [facility] [phone]) found high grade endometrial stromal sarcoma with primary 18 cm, 8/45 nodes involved including 2 right pelvic and 6 left pelvic nodes; IHC for ER PR was to be done at [facility]. Surgery was uncomplicated and she was DC home on ~ POD #3 with lovenox x 28 days. Case was presented at [facility] multidisciplinary conference 07-23-14, with recommendation for PET CT to evaluate inguinal and portahepatis nodes, and to consider adjuvant gemzar taxotere and possibly follow with 4 cycles of adriamycin, then to consider whole pelvic RT. She saw [name] for post op follow up on 07-28-14, with recommendation for gemzar taxotere and adriamycin, whole pelvic RT after chemo and consideration of Megace maintenance after chemo. PET 08-01-14 had some uptake in aortocaval and left paraaortic regions. She had day 1 cycle 1 gemzar taxotere on 08-28-14, day 8 cycle 1 on 09-04-14 and neulasta on 09-06-14. Admitted with neutropenic fever on day 14 cycle 1, with oral mucositis and some diarrhea. Counts maintained cycle 2 using OnPro neulasta day 9. She had progressive LE swelling after cycle 3, with venous dopplers negative 10-23-14, then LE swelling and SOB so marked after day 1 cycle 4 that chemo was held. Restaging CT AP + CXR 11-10-14 had stable tiny left pulmonary nodule/ no pleural  effusion and normal heart size; CT had necrotic aortocaval adenopathy, iliac adenopathy L>R and 7x10 cm fluid collection in pelvis. IMRT to begin 12-01-14.     Review of systems as above, also: No fever or symptoms of infection. Facial and periorbital swelling and erythema resolved. Bladder  ok. No cough. No bleeding. No problems with PAC. She wants to begin [employer], which she feels will be better nutrition than present and may allow gradual weight loss. Remainder of 10 point Review of Systems negative.  Objective:  Vital signs in last 24 hours:  BP 128/70 mmHg  Pulse 91  Temp(Src) 98.2 F (36.8 C) (Oral)  Resp 18  Ht 5' (1.524 m)  Wt 247 lb 4.8 oz (112.175 kg)  BMI 48.30 kg/m2  SpO2 100%  LMP 02/04/2014  weight down 5 lbs from 11-06-14.  Alert, oriented and appropriate. Ambulatory with some difficulty, but overall better than at last exam. Alopecia  HEENT:PERRL, sclerae not icteric. Oral mucosa moist without lesions, posterior pharynx clear.  Neck supple. No JVD.  Lymphatics:supraclavicular adenopathy Resp: clear to auscultation bilaterally and normal percussion bilaterally Cardio: regular rate and rhythm. No gallop. Clear heart sounds MW:NUUVOZD obese, soft, nontender, not obviously distended, no appreciable mass or organomegaly. Normally active bowel sounds. Surgical incision not remarkable. Musculoskeletal/ Extremities: LE with 3+ swelling but no longer tight, without cords or discreet tenderness Neuro: no change peripheral neuropathy. Otherwise nonfocal. Psych appropriate mood and affect Skin without rash, ecchymosis, petechiae. Face without erythema, desquamation or periorbital swelling/ discoloration. Hands improved.  Portacath-without erythema or tenderness  Lab Results:  Results for orders placed or performed in visit on 11/27/14  CBC with Differential  Result Value Ref Range   WBC 6.6 3.9 - 10.3 10e3/uL   NEUT# 4.3 1.5 - 6.5 10e3/uL   HGB 11.3 (L) 11.6 - 15.9 g/dL   HCT 36.9 34.8 - 46.6 %   Platelets 348 145 - 400 10e3/uL   MCV 85.4 79.5 - 101.0 fL   MCH 26.2 25.1 - 34.0 pg   MCHC 30.6 (L) 31.5 - 36.0 g/dL   RBC 4.32 3.70 - 5.45 10e6/uL   RDW 21.5 (H) 11.2 - 14.5 %   lymph# 1.0 0.9 - 3.3 10e3/uL   MONO# 0.6 0.1 - 0.9 10e3/uL   Eosinophils  Absolute 0.6 (H) 0.0 - 0.5 10e3/uL   Basophils Absolute 0.1 0.0 - 0.1 10e3/uL   NEUT% 65.3 38.4 - 76.8 %   LYMPH% 15.0 14.0 - 49.7 %   MONO% 9.2 0.0 - 14.0 %   EOS% 9.1 (H) 0.0 - 7.0 %   BASO% 1.4 0.0 - 2.0 %  Comprehensive metabolic panel (Cmet) - [facility]  Result Value Ref Range   Sodium 142 136 - 145 mEq/L   Potassium 4.4 3.5 - 5.1 mEq/L   Chloride 107 98 - 109 mEq/L   CO2 28 22 - 29 mEq/L   Glucose 95 70 - 140 mg/dl   BUN 13.2 7.0 - 26.0 mg/dL   Creatinine 0.7 0.6 - 1.1 mg/dL   Total Bilirubin 1.31 (H) 0.20 - 1.20 mg/dL   Alkaline Phosphatase 60 40 - 150 U/L   AST 27 5 - 34 U/L   ALT 20 0 - 55 U/L   Total Protein 6.3 (L) 6.4 - 8.3 g/dL   Albumin 3.5 3.5 - 5.0 g/dL   Calcium 8.9 8.4 - 10.4 mg/dL   Anion Gap 8 3 - 11 mEq/L   EGFR >90 >90 ml/min/1.73 m2     Studies/Results:  EXAM: CT ABDOMEN AND PELVIS WITH CONTRAST   11-10-14  TECHNIQUE: Multidetector CT imaging of the abdomen and pelvis was performed using the standard protocol following bolus administration of intravenous contrast.  CONTRAST: 147mL OMNIPAQUE IOHEXOL 300 MG/ML SOLN  COMPARISON: PET 08/01/2014 and CT abdomen pelvis 06/25/2014.  FINDINGS: Lower chest: Lung bases show no acute findings. Scattered pulmonary parenchymal scarring. Heart is at the upper limits of normal in size. No pericardial or pleural effusion.  Hepatobiliary: Liver and gallbladder are unremarkable. No biliary ductal dilatation.  Pancreas: Negative.  Spleen: Negative.  Adrenals/Urinary Tract: Adrenal glands are unremarkable. Right kidney appears non rotated. Kidneys are otherwise unremarkable. Ureters are decompressed. Bladder is unremarkable.  Stomach/Bowel: Postoperative changes of gastric sleeve are noted. Small bowel, appendix and colon are unremarkable.  Vascular/Lymphatic: Minimal atherosclerotic calcification of the arterial vasculature. Aortocaval adenopathy appears necrotic, measuring up to 2.8 x 2.9 cm  (series 2, image 49), previously 5 mm in short axis. Adenopathy extends along the iliac chains bilaterally, left greater than right. Index left common iliac lymph node measures 1.8 cm (series 2, image 58), previously 7 mm. Index left internal iliac lymph node measures approximately 1.5 x 2.6 cm (series 2, image 75), previously 10 mm in short axis.  Reproductive: Hysterectomy. Ovaries are not readily visualized.  Other: A bilobed fluid collection in the left anatomic pelvis measures 7.2 x 10.3 cm, new. Small amount of fluid is seen adjacent to the cecum (series 2, image 74). Mesenteries and peritoneum are otherwise unremarkable. Postoperative changes along the midline ventral abdominal pelvic wall.  Musculoskeletal: No worrisome lytic or sclerotic lesions. Degenerative changes are seen in the spine.  IMPRESSION: 1. Interval hysterectomy with progressive metastatic abdominal and pelvic retroperitoneal adenopathy. 2. Large loculated fluid collection in the left anatomic pelvis may represent a postoperative seroma. Peritoneal metastatic disease is not excluded. 3. Small amount of fluid adjacent to the cecum, likely postoperative.    CHEST 2 VIEW   11-10-14  COMPARISON: Chest x-rays dated 10/23/2014 and 07/22/2010 and PET-CT dated 08/01/2014  FINDINGS: Power port appears in good position, unchanged. Heart size and pulmonary vascularity are normal. No infiltrates or effusions. Minimal linear scarring the lingula. 2 mm nodule seen in the left midzone laterally is unchanged since 2012.  No effusions. No osseous abnormality.  IMPRESSION: No active cardiopulmonary disease. No evidence of metastatic disease.   Medications: I have reviewed the patient's current medications. She will use Aleve instead of previous Mobic, each dose with food.   DISCUSSION: I had spoken with patient by phone re CT findings after that scan. I have told her that progression during the gemzar  and taxotere is certainly worrisome. She understands that the radiation to involved nodal areas is in attempt to improve the LE swelling particularly. We have mentioned possible further systemic treatment after RT. She is interested in second opinion consultation with sarcoma specialist clinic at one of the universities; I will be in touch with those clinics and make referral as appropriate. She understands that there may be studies or treatments that would need to be done at the tertiary care center, or it may be that she can continue treatment in [place] depending on recommendations.   I have cautioned her that she should let MD know if diarrhea with radiation, and needs to stay well hydrated if so. Depending on timing of consultation visit with sarcoma clinic, I may need to see her here with labs during RT.  PAC should be flushed every 6-8 weeks if not  otherwise used.     Assessment/Plan:  1.high grade endometrial stromal sarcoma with bilateral pelvic node involvement at TAH BSO, pelvic lymphadenectomy and other node sampling at [facility] 07-14-14. Cycle 1 gemzar taxotere begun 08-28-14, thru day 1 cycle 4 on 10-30-14. Repeat CT AP due to progressive LE swelling has increased aortocaval and bilateral iliac adenopathy as well as fluid in left pelvis. Appreciate Dr Clabe Seal help with IMRT. Referral to tertiary care sarcoma clinic for second opinion. Follow up here depending on this. 2. LE edema: venous dopplers negative 5-19. Last taxotere 10-20-14. Recent good EF. Appears primarily from the progressive adenopathy, tho may have had some fluid retention from taxotere, and possibly some from Mobic. Swelling is minimally better now, even prior to start of RT. 3.SOB and fatigue likely multifactorial with gemzar fatigue, anemia, LE edema. Also a little better since out further from chemo and with Hgb up to 11.3 today. 4.morbid obesity, BMI 44.6. post gastric banding with initial weight loss 50 lbs 5.degenerative  arthritis knees: symptoms better with interventions by orthopedics prior to start of chemo. LE swelling has made symptoms more dramatic 6.skin reaction hands and face seems to be from taxotere: much better 7.PAC in 8.chronic alopecia such that hair loss with chemo is not concerning to her 9.no flu vaccine 10.history skin ca, apparently nonmelanoma 11,iron deficiency anemia related to gyn bleeding: on ferrous fumarate in preparation that includes vit C, hgb better at 11.3 today, iron stores documented low. Continue oral iron  All questions answered. I do not think patient had understood phone conversation as well as our discussion today, tho I was not aware of that previously. Time spent 30 min including >50% counseling and coordination of care.   Gordy Levan, MD   11/27/2014, 8:32 PM

## 2014-11-29 DIAGNOSIS — C772 Secondary and unspecified malignant neoplasm of intra-abdominal lymph nodes: Secondary | ICD-10-CM | POA: Insufficient documentation

## 2014-12-01 ENCOUNTER — Other Ambulatory Visit: Payer: Self-pay | Admitting: Oncology

## 2014-12-01 ENCOUNTER — Ambulatory Visit
Admission: RE | Admit: 2014-12-01 | Discharge: 2014-12-01 | Disposition: A | Discharge status: Home / Self Care | Payer: Federal, State, Local not specified - PPO | Source: Ambulatory Visit | Attending: Radiation Oncology | Admitting: Radiation Oncology

## 2014-12-01 ENCOUNTER — Telehealth: Payer: Self-pay | Admitting: Oncology

## 2014-12-01 DIAGNOSIS — C541 Malignant neoplasm of endometrium: Secondary | ICD-10-CM | POA: Diagnosis not present

## 2014-12-01 NOTE — Telephone Encounter (Signed)
Medical Oncology  Second opinion consultation set up at [place] clinic with Dr Budd PalmerIrish Elders for 12-09-14 @ 1130. Records to be faxed and imaging discs sent by [employer] prior, at their request. Message with all details to RN now  L.Marko Plume, MD

## 2014-12-02 ENCOUNTER — Ambulatory Visit
Admission: RE | Admit: 2014-12-02 | Discharge: 2014-12-02 | Disposition: A | Discharge status: Home / Self Care | Payer: Federal, State, Local not specified - PPO | Source: Ambulatory Visit | Attending: Radiation Oncology | Admitting: Radiation Oncology

## 2014-12-02 ENCOUNTER — Telehealth: Payer: Self-pay

## 2014-12-02 ENCOUNTER — Encounter: Payer: Self-pay | Admitting: Radiation Oncology

## 2014-12-02 VITALS — BP 131/73 | HR 80 | Resp 18 | Wt 241.7 lb

## 2014-12-02 DIAGNOSIS — C772 Secondary and unspecified malignant neoplasm of intra-abdominal lymph nodes: Secondary | ICD-10-CM

## 2014-12-02 DIAGNOSIS — C541 Malignant neoplasm of endometrium: Secondary | ICD-10-CM | POA: Diagnosis not present

## 2014-12-02 NOTE — Telephone Encounter (Signed)
Radiation appointment for 12-09-14 was r/s to [phone] so not to conflict with [facility] appointment as noted below.  Pt aware of date and time of [facility] appointment. She will contact their office for directions. L/M in cell voice mail to p/u  radiology CD of scans at front desk at [facility] Radiology tomorrow after her radiation treatment at  1245 to bring to [facility] do not arrive in time for her appointment.

## 2014-12-02 NOTE — Progress Notes (Signed)
  Radiation Oncology         [phone]   Name: Erin Santiago MRN: [id number]   Date: 12/02/2014  DOB: 1955/10/23   Weekly Radiation Therapy Management    ICD-9-CM ICD-10-CM   1. 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 Denies pain. Denies dysuria or hematuria. Reports urine is darker in color than normal but, without odor. Reports edema of lower extremities has improved and her weight reflects that. Pitting edema worse at plus 3 in left leg and plus 2 in right. Patient denies pain in lower extremities. Denies vaginal odor, itching or discharge. Reports bowels are moving. Patient denies nausea or vomiting. Scheduled for consultation at [place] clinic on 7/5. 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 Reports SOB with exertion has improved. Reports warm sensation in her abdomen during treatment. The patient is without complaint. Set-up films were reviewed. The chart was checked.  Physical Findings  weight is 241 lb 11.2 oz (109.634 kg). Her blood pressure is 131/73 and her pulse is 80. Her respiration is 18. . Weight essentially stable.  No significant changes.  Impression The patient is tolerating radiation.  Plan Continue treatment as planned.     This document serves as a record of services personally performed by Tyler Pita, MD. It was created on his behalf by Arlyce Harman, a trained medical scribe. The creation of this record is based on the scribe's personal observations and the provider's statements to them. This document has been checked and approved by the attending provider.       Sheral Apley Tammi Klippel, M.D.

## 2014-12-02 NOTE — Progress Notes (Addendum)
Vitals stable. Denies pain. Denies dysuria or hematuria. Reports urine is darker in color than normal but, without odor. Reports edema of lower extremities has improved and her weight reflects that. Pitting edema worse at plus 3 in left leg and plus 2 in right. Patient denies pain in lower extremities. Denies vaginal odor, itching or discharge. Reports bowels are moving. Patient denies nausea or vomiting. Scheduled for consultation at [place] clinic on 7/5. 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 Reports SOB with exertion has improved.  BP 131/73 mmHg  Pulse 80  Resp 18  Wt 241 lb 11.2 oz (109.634 kg)  LMP 02/04/2014 Wt Readings from Last 3 Encounters:  12/02/14 241 lb 11.2 oz (109.634 kg)  11/27/14 247 lb 4.8 oz (112.175 kg)  11/19/14 252 lb 9.6 oz (114.579 kg)

## 2014-12-02 NOTE — Telephone Encounter (Addendum)
Spoke with Tedra Coupe in [facility] Radiology file room and requested a disc to be sent FED EX as noted below along with a copy for Ms. Laforte to pick up tomorrow at radiology front desk,after her radiation treatment. Pathology done at [facility].

## 2014-12-02 NOTE — Telephone Encounter (Signed)
-----   Message from Gordy Levan, MD sent at 12/01/2014 11:09 AM EDT ----- Note patient starts RT this week. If time of [facility] visit 7-5 conflicts with RT, please let RT know that she needs to keep [facility] consultation if possible.   Please let patient know that I have set her up for second opinion consultation with the sarcoma medical oncologist at [facility], Dr Tonye Becket. Appointment Tues December 08 1128. Needs to check in on ground floor of [facility] cancer center at 1115.  We need to fax records to [facility]  Fax # [phone]  Need to Fed Ex the scan discs/ + I would like patient to hand carry discs CT 11-10-14 and PET 08-01-14  Fed ex # [id number]  [place] [address], [place] [address]

## 2014-12-03 ENCOUNTER — Ambulatory Visit
Admission: RE | Admit: 2014-12-03 | Discharge: 2014-12-03 | Disposition: A | Discharge status: Home / Self Care | Payer: Federal, State, Local not specified - PPO | Source: Ambulatory Visit | Attending: Radiation Oncology | Admitting: Radiation Oncology

## 2014-12-03 DIAGNOSIS — C541 Malignant neoplasm of endometrium: Secondary | ICD-10-CM | POA: Diagnosis not present

## 2014-12-03 NOTE — Telephone Encounter (Signed)
Records faxed today to [facility] at [phone] as requested below by Dr. Marko Plume.

## 2014-12-04 ENCOUNTER — Encounter: Payer: Self-pay | Admitting: *Deleted

## 2014-12-04 ENCOUNTER — Ambulatory Visit
Admission: RE | Admit: 2014-12-04 | Discharge: 2014-12-04 | Disposition: A | Discharge status: Home / Self Care | Payer: Federal, State, Local not specified - PPO | Source: Ambulatory Visit | Attending: Radiation Oncology | Admitting: Radiation Oncology

## 2014-12-04 DIAGNOSIS — C541 Malignant neoplasm of endometrium: Secondary | ICD-10-CM | POA: Diagnosis not present

## 2014-12-04 NOTE — Progress Notes (Signed)
[facility] Psychosocial Distress Screening Clinical Social Work  Clinical Social Work was referred by distress screening protocol.  The patient scored a 5 on the Psychosocial Distress Thermometer which indicates moderate distress. Clinical Social Worker contacted patient by phone to assess for distress and other psychosocial needs. Ms. Zuver shared she feels that she needs counseling to deal with current situation.  CSW scheduled to meet with patient briefly before tomorrow' radiation appointment.  CSW will schedule future counseling appointment with patient or refer to community.  ONCBCN DISTRESS SCREENING 11/18/2014  Screening Type Initial Screening  Distress experienced in past week (1-10) 5  Emotional problem type Adjusting to illness;Boredom;Adjusting to appearance changes  Spiritual/Religous concerns type Facing my mortality  Information Concerns Type Lack of info about diagnosis;Lack of info about treatment;Lack of info about complementary therapy choices  Physical Problem type Getting around;Swollen arms/legs  Physician notified of physical symptoms Yes    Clinical Social Worker follow up needed: Yes.    If yes, follow up plan: Will meet on 12/05/14 at 2:30.  Polo Riley, MSW, LCSW, OSW-C Clinical Social Worker [facility] [phone]

## 2014-12-05 ENCOUNTER — Ambulatory Visit
Admission: RE | Admit: 2014-12-05 | Discharge: 2014-12-05 | Disposition: A | Discharge status: Home / Self Care | Payer: Federal, State, Local not specified - PPO | Source: Ambulatory Visit | Attending: Radiation Oncology | Admitting: Radiation Oncology

## 2014-12-05 DIAGNOSIS — C541 Malignant neoplasm of endometrium: Secondary | ICD-10-CM | POA: Diagnosis not present

## 2014-12-05 NOTE — Progress Notes (Signed)
.  Pt here for patient teaching.  Pt given Radiation and You booklet and skin care instructions. Reviewed areas of pertinence such as diarrhea, fatigue, nausea and vomiting and skin changes . Pt able to give teach back of to pat skin, use unscented/gentle soap, use baby wipes, have Imodium on hand and drink plenty of water,avoid applying anything to skin within 4 hours of treatment. Pt demonstrated understanding and verbalizes understanding of information given and will contact nursing with any questions or concerns.

## 2014-12-09 ENCOUNTER — Ambulatory Visit
Admission: RE | Admit: 2014-12-09 | Discharge: 2014-12-09 | Disposition: A | Discharge status: Home / Self Care | Payer: Federal, State, Local not specified - PPO | Source: Ambulatory Visit | Attending: Radiation Oncology | Admitting: Radiation Oncology

## 2014-12-09 ENCOUNTER — Encounter: Payer: Self-pay | Admitting: Radiation Oncology

## 2014-12-09 VITALS — BP 138/75 | HR 84 | Temp 98.5°F | Resp 20 | Ht 60.0 in | Wt 235.8 lb

## 2014-12-09 DIAGNOSIS — C541 Malignant neoplasm of endometrium: Secondary | ICD-10-CM

## 2014-12-09 DIAGNOSIS — Z510 Encounter for antineoplastic radiation therapy: Secondary | ICD-10-CM | POA: Diagnosis present

## 2014-12-09 DIAGNOSIS — C772 Secondary and unspecified malignant neoplasm of intra-abdominal lymph nodes: Secondary | ICD-10-CM

## 2014-12-09 MED ORDER — RADIAPLEXRX EX GEL
Freq: Once | CUTANEOUS | Status: AC
  Administered 2014-12-09: 17:00:00 via TOPICAL

## 2014-12-09 NOTE — Progress Notes (Signed)
  Radiation Oncology         [phone] ________________________________  Name: Erin Santiago MRN: [id number]  Date: 12/09/2014  DOB: 1956/03/25  Weekly Radiation Therapy Management  DIAGNOSIS: Endometrial stromal sarcoma, stage III-C (T2b, N1, M0)  Current Dose: 10.8 Gy     Planned Dose:  45 Gy  Narrative . . . . . . . . The patient presents for routine under treatment assessment.                                   The patient is without complaint. She denies any nausea or problems with diarrhea.  She has noticed a burning sensation along her laparotomy scar after her radiation treatment for approximately a half-hour to 45 minutes.                                 Set-up films were reviewed.                                 The chart was checked. Physical Findings. . .  height is 5' (1.524 m) and weight is 235 lb 12.8 oz (106.958 kg). Her oral temperature is 98.5 F (36.9 C). Her blood pressure is 138/75 and her pulse is 84. Her respiration is 20. . The lungs are clear. The heart has a regular rhythm and rate. The abdomen is soft and nontender with normal bowel sounds. The laparotomy scar is well-healed.  No signs of infection. Impression . . . . . . . The patient is tolerating radiation. Plan . . . . . . . . . . . . Continue treatment as planned. Patient did see the medical oncologist Willeen Niece) at [facility] earlier today as a second opinion concerning her management given the fact that she did not respond well to to first line chemotherapy. She will be speaking with her surgeon Dr. Marlaine Hind to see if the patient may be a candidate for additional surgery given this situation.  ________________________________   Blair Promise, PhD, MD

## 2014-12-09 NOTE — Addendum Note (Signed)
Encounter addended by: Jacqulyn Liner, RN on: 12/09/2014  5:02 PM<BR>     Documentation filed: Inpatient MAR

## 2014-12-09 NOTE — Progress Notes (Signed)
Erin Santiago has completed 6 fractions to her pelvis.  She denies pain, bladder issues, bowel issues, vaginal bleeding/discharge and fatigue.  She has lost 6 lbs from last week and said she thinks it is fluid.    BP 138/75 mmHg  Pulse 84  Temp(Src) 98.5 F (36.9 C) (Oral)  Resp 20  Ht 5' (1.524 m)  Wt 235 lb 12.8 oz (106.958 kg)  BMI 46.05 kg/m2  LMP 02/04/2014

## 2014-12-09 NOTE — Addendum Note (Signed)
Encounter addended by: Jacqulyn Liner, RN on: 12/09/2014  5:01 PM<BR>     Documentation filed: Visit Diagnoses, Dx Association, Medications, Orders

## 2014-12-10 ENCOUNTER — Ambulatory Visit
Admission: RE | Admit: 2014-12-10 | Discharge: 2014-12-10 | Disposition: A | Discharge status: Home / Self Care | Payer: Federal, State, Local not specified - PPO | Source: Ambulatory Visit | Attending: Radiation Oncology | Admitting: Radiation Oncology

## 2014-12-10 DIAGNOSIS — C541 Malignant neoplasm of endometrium: Secondary | ICD-10-CM | POA: Diagnosis not present

## 2014-12-11 ENCOUNTER — Telehealth: Payer: Self-pay | Admitting: Oncology

## 2014-12-11 ENCOUNTER — Ambulatory Visit
Admission: RE | Admit: 2014-12-11 | Discharge: 2014-12-11 | Disposition: A | Discharge status: Home / Self Care | Payer: Federal, State, Local not specified - PPO | Source: Ambulatory Visit | Attending: Radiation Oncology | Admitting: Radiation Oncology

## 2014-12-11 ENCOUNTER — Other Ambulatory Visit: Payer: Self-pay | Admitting: Oncology

## 2014-12-11 DIAGNOSIS — C541 Malignant neoplasm of endometrium: Secondary | ICD-10-CM | POA: Diagnosis not present

## 2014-12-11 NOTE — Telephone Encounter (Signed)
Medical Oncology   Phone call earlier today from Dr [name] at [facility] sarcoma clinic, who saw patient in consultation. She has reviewed CT with [name], who does not recommend additional surgery. She discussed with Dr Teryl Lucy and is in agreement with radiation, which should be able to encompass majority of obvious recurrence.   She suggests close observation after radiation until clearly progresses again, with CT CAP for surveillance.  Discussed NCI Match Trial based on genetic testing. Plans molecular testing at [facility], is hoping that patient is already registered for this from the time of surgery. No trials available now, however Dr Faythe Ghee also is responsible for the [facility] phase I trials, so will be glad to see what is available when patient does progress.  Could consider adriamycin at progression, tho may get radiation recall; best to delay the adria as long as possible after radiation and would not use it if significant problems (bowel perf etc) from radiation.  IMRT scheduled 12-11-14 thru 01-05-15. POF sent for appointment with this MD + lab on 12-30-14.   Godfrey Pick, MD

## 2014-12-12 ENCOUNTER — Ambulatory Visit
Admission: RE | Admit: 2014-12-12 | Discharge: 2014-12-12 | Disposition: A | Discharge status: Home / Self Care | Payer: Federal, State, Local not specified - PPO | Source: Ambulatory Visit | Attending: Radiation Oncology | Admitting: Radiation Oncology

## 2014-12-12 DIAGNOSIS — C541 Malignant neoplasm of endometrium: Secondary | ICD-10-CM | POA: Diagnosis not present

## 2014-12-15 ENCOUNTER — Telehealth: Payer: Self-pay | Admitting: Oncology

## 2014-12-15 ENCOUNTER — Ambulatory Visit
Admission: RE | Admit: 2014-12-15 | Discharge: 2014-12-15 | Disposition: A | Discharge status: Home / Self Care | Payer: Federal, State, Local not specified - PPO | Source: Ambulatory Visit | Attending: Radiation Oncology | Admitting: Radiation Oncology

## 2014-12-15 DIAGNOSIS — C541 Malignant neoplasm of endometrium: Secondary | ICD-10-CM | POA: Diagnosis not present

## 2014-12-15 NOTE — Telephone Encounter (Signed)
lvm for pt regarding to July appt....mailed pt appt sched and letter °

## 2014-12-16 ENCOUNTER — Ambulatory Visit
Admission: RE | Admit: 2014-12-16 | Discharge: 2014-12-16 | Disposition: A | Discharge status: Home / Self Care | Payer: Federal, State, Local not specified - PPO | Source: Ambulatory Visit | Attending: Radiation Oncology | Admitting: Radiation Oncology

## 2014-12-16 ENCOUNTER — Encounter: Payer: Self-pay | Admitting: Radiation Oncology

## 2014-12-16 VITALS — BP 135/75 | HR 85 | Temp 97.9°F | Resp 20 | Ht 60.0 in | Wt 235.8 lb

## 2014-12-16 DIAGNOSIS — C541 Malignant neoplasm of endometrium: Secondary | ICD-10-CM | POA: Diagnosis not present

## 2014-12-16 NOTE — Progress Notes (Signed)
Erin Santiago has completed 11 fractions to her pelvis.  She denies pain.  She did have intermittent gas pains on Saturday for 3-4 hours.  She reports having 3 loose bowel movements in the mornings.  She has not taken Imodium yet.  She denies nausea.  She reports her skin in intact.  She denies having vaginal bleeding.  She reports fatigue.  BP 135/75 mmHg  Pulse 85  Temp(Src) 97.9 F (36.6 C) (Oral)  Resp 20  Ht 5' (1.524 m)  Wt 235 lb 12.8 oz (106.958 kg)  BMI 46.05 kg/m2  LMP 02/04/2014

## 2014-12-16 NOTE — Progress Notes (Signed)
  Radiation Oncology         (336) [phone] ________________________________  Name: Erin Santiago MRN: [id number]  Date: 12/16/2014  DOB: [birth year]     Weekly Radiation Therapy Management  DIAGNOSIS: Endometrial stromal sarcoma, stage III-C (T2b, N1, M0)  Current Dose: 19.6 Gy     Planned Dose:  45 Gy  Narrative . . . . . . . . The patient presents for routine under treatment assessment.                                   The patient is without complaint.  She denies pain. She did have intermittent gas pains on Saturday for 3-4 hours. She reports having 3 loose bowel movements in the mornings. She has not taken Imodium yet. She denies nausea. She reports her skin in intact. She denies having vaginal bleeding. She reports fatigue.                                 Set-up films were reviewed.                                 The chart was checked. Physical Findings. . .  height is 5' (1.524 m) and weight is 235 lb 12.8 oz (106.958 kg). Her oral temperature is 97.9 F (36.6 C). Her blood pressure is 135/75 and her pulse is 85. Her respiration is 20. . The lungs are clear. The heart has regular rhythm and rate. The abdomen is soft and nontender with normal bowel sounds. Patient's lower extremity edema seems to be improved compared to my last exam. This may be possibly related to her gemcitabine  chemotherapy. Impression . . . . . . . The patient is tolerating radiation. Plan . . . . . . . . . . . . Continue treatment as planned.  ________________________________   Blair Promise, PhD, MD

## 2014-12-17 ENCOUNTER — Encounter: Payer: Self-pay | Admitting: Oncology

## 2014-12-17 ENCOUNTER — Ambulatory Visit
Admission: RE | Admit: 2014-12-17 | Discharge: 2014-12-17 | Disposition: A | Discharge status: Home / Self Care | Payer: Federal, State, Local not specified - PPO | Source: Ambulatory Visit | Attending: Radiation Oncology | Admitting: Radiation Oncology

## 2014-12-17 DIAGNOSIS — C541 Malignant neoplasm of endometrium: Secondary | ICD-10-CM | POA: Diagnosis not present

## 2014-12-18 ENCOUNTER — Ambulatory Visit
Admission: RE | Admit: 2014-12-18 | Discharge: 2014-12-18 | Disposition: A | Discharge status: Home / Self Care | Payer: Federal, State, Local not specified - PPO | Source: Ambulatory Visit | Attending: Radiation Oncology | Admitting: Radiation Oncology

## 2014-12-18 DIAGNOSIS — C541 Malignant neoplasm of endometrium: Secondary | ICD-10-CM | POA: Diagnosis not present

## 2014-12-19 ENCOUNTER — Ambulatory Visit
Admission: RE | Admit: 2014-12-19 | Discharge: 2014-12-19 | Disposition: A | Discharge status: Home / Self Care | Payer: Federal, State, Local not specified - PPO | Source: Ambulatory Visit | Attending: Radiation Oncology | Admitting: Radiation Oncology

## 2014-12-19 DIAGNOSIS — C541 Malignant neoplasm of endometrium: Secondary | ICD-10-CM | POA: Diagnosis not present

## 2014-12-22 ENCOUNTER — Ambulatory Visit
Admission: RE | Admit: 2014-12-22 | Discharge: 2014-12-22 | Disposition: A | Discharge status: Home / Self Care | Payer: Federal, State, Local not specified - PPO | Source: Ambulatory Visit | Attending: Radiation Oncology | Admitting: Radiation Oncology

## 2014-12-22 DIAGNOSIS — C541 Malignant neoplasm of endometrium: Secondary | ICD-10-CM | POA: Diagnosis not present

## 2014-12-23 ENCOUNTER — Encounter: Payer: Self-pay | Admitting: Radiation Oncology

## 2014-12-23 ENCOUNTER — Ambulatory Visit
Admission: RE | Admit: 2014-12-23 | Discharge: 2014-12-23 | Disposition: A | Discharge status: Home / Self Care | Payer: Federal, State, Local not specified - PPO | Source: Ambulatory Visit | Attending: Radiation Oncology | Admitting: Radiation Oncology

## 2014-12-23 VITALS — BP 158/85 | HR 82 | Temp 97.5°F | Resp 20 | Wt 231.7 lb

## 2014-12-23 DIAGNOSIS — C541 Malignant neoplasm of endometrium: Secondary | ICD-10-CM

## 2014-12-23 NOTE — Progress Notes (Signed)
  Radiation Oncology         [phone] ________________________________  Name: Erin Santiago MRN: [id number]  Date: 12/23/2014  DOB: 01/02/1956  Weekly Radiation Therapy Management    ICD-9-CM ICD-10-CM   1. Endometrial stromal sarcoma 182.0 C54.1      Current Dose: 28.8 Gy     Planned Dose:  45 Gy  Narrative . . . . . . . . The patient presents for routine under treatment assessment.                                   The patient is without complaint. She has mild fatigue. She has noticed some loose stools but no diarrhea. Patient denies any further problems with swelling in her lower extremities.                                 Set-up films were reviewed.                                 The chart was checked. Physical Findings. . .  weight is 231 lb 11.2 oz (105.098 kg). Her oral temperature is 97.5 F (36.4 C). Her blood pressure is 158/85 and her pulse is 82. Her respiration is 20. . The lungs are clear. The heart has a regular rhythm and rate. The abdomen is soft and nontender with normal bowel sounds. Less edema is noted in the lower extremities. Impression . . . . . . . The patient is tolerating radiation. Plan . . . . . . . . . . . . Continue treatment as planned.  ________________________________   Blair Promise, PhD, MD

## 2014-12-23 NOTE — Progress Notes (Signed)
Weekly rad txs pelvis 16/25  Completed, no skin changes, no nausea, pain, 2-3 loose stools daily, no discharge or bleeding ,has mild fatigue,appetite good 2:25 PM BP 158/85 mmHg  Pulse 82  Temp(Src) 97.5 F (36.4 C) (Oral)  Resp 20  Wt 231 lb 11.2 oz (105.098 kg)  LMP 02/04/2014  Wt Readings from Last 3 Encounters:  12/23/14 231 lb 11.2 oz (105.098 kg)  12/16/14 235 lb 12.8 oz (106.958 kg)  12/09/14 235 lb 12.8 oz (106.958 kg)

## 2014-12-24 ENCOUNTER — Telehealth: Payer: Self-pay | Admitting: Oncology

## 2014-12-24 ENCOUNTER — Ambulatory Visit
Admission: RE | Admit: 2014-12-24 | Discharge: 2014-12-24 | Disposition: A | Discharge status: Home / Self Care | Payer: Federal, State, Local not specified - PPO | Source: Ambulatory Visit | Attending: Radiation Oncology | Admitting: Radiation Oncology

## 2014-12-24 DIAGNOSIS — C541 Malignant neoplasm of endometrium: Secondary | ICD-10-CM | POA: Diagnosis not present

## 2014-12-24 NOTE — Telephone Encounter (Signed)
Per 7/13 staff message from [place] - see EMR message she received from patient re conflict on 7/39 with LL and Kinard. Per LL she will see patient in her lunch break. Moved lab/LL to 12 pm and 12:30 pm. Spoke with patient she is aware.

## 2014-12-25 ENCOUNTER — Ambulatory Visit
Admission: RE | Admit: 2014-12-25 | Discharge: 2014-12-25 | Disposition: A | Discharge status: Home / Self Care | Payer: Federal, State, Local not specified - PPO | Source: Ambulatory Visit | Attending: Radiation Oncology | Admitting: Radiation Oncology

## 2014-12-25 DIAGNOSIS — C541 Malignant neoplasm of endometrium: Secondary | ICD-10-CM | POA: Diagnosis not present

## 2014-12-26 ENCOUNTER — Ambulatory Visit
Admission: RE | Admit: 2014-12-26 | Discharge: 2014-12-26 | Disposition: A | Discharge status: Home / Self Care | Payer: Federal, State, Local not specified - PPO | Source: Ambulatory Visit | Attending: Radiation Oncology | Admitting: Radiation Oncology

## 2014-12-26 DIAGNOSIS — C541 Malignant neoplasm of endometrium: Secondary | ICD-10-CM | POA: Diagnosis not present

## 2014-12-28 ENCOUNTER — Other Ambulatory Visit: Payer: Self-pay | Admitting: Oncology

## 2014-12-29 ENCOUNTER — Ambulatory Visit
Admission: RE | Admit: 2014-12-29 | Discharge: 2014-12-29 | Disposition: A | Discharge status: Home / Self Care | Payer: Federal, State, Local not specified - PPO | Source: Ambulatory Visit | Attending: Radiation Oncology | Admitting: Radiation Oncology

## 2014-12-29 DIAGNOSIS — C541 Malignant neoplasm of endometrium: Secondary | ICD-10-CM | POA: Diagnosis not present

## 2014-12-30 ENCOUNTER — Ambulatory Visit
Admission: RE | Admit: 2014-12-30 | Discharge: 2014-12-30 | Disposition: A | Discharge status: Home / Self Care | Payer: Federal, State, Local not specified - PPO | Source: Ambulatory Visit | Attending: Radiation Oncology | Admitting: Radiation Oncology

## 2014-12-30 ENCOUNTER — Ambulatory Visit (HOSPITAL_BASED_OUTPATIENT_CLINIC_OR_DEPARTMENT_OTHER): Payer: Federal, State, Local not specified - PPO

## 2014-12-30 ENCOUNTER — Other Ambulatory Visit (HOSPITAL_BASED_OUTPATIENT_CLINIC_OR_DEPARTMENT_OTHER): Payer: Federal, State, Local not specified - PPO

## 2014-12-30 ENCOUNTER — Encounter: Payer: Self-pay | Admitting: Radiation Oncology

## 2014-12-30 ENCOUNTER — Ambulatory Visit (HOSPITAL_BASED_OUTPATIENT_CLINIC_OR_DEPARTMENT_OTHER): Payer: Federal, State, Local not specified - PPO | Admitting: Oncology

## 2014-12-30 ENCOUNTER — Telehealth: Payer: Self-pay | Admitting: Oncology

## 2014-12-30 ENCOUNTER — Encounter: Payer: Self-pay | Admitting: Oncology

## 2014-12-30 VITALS — BP 127/73 | HR 78 | Temp 98.3°F | Resp 18 | Ht 60.0 in | Wt 229.6 lb

## 2014-12-30 VITALS — BP 139/67 | HR 73 | Temp 98.0°F | Resp 16 | Ht 60.0 in | Wt 229.6 lb

## 2014-12-30 DIAGNOSIS — Z95828 Presence of other vascular implants and grafts: Secondary | ICD-10-CM

## 2014-12-30 DIAGNOSIS — C541 Malignant neoplasm of endometrium: Secondary | ICD-10-CM

## 2014-12-30 DIAGNOSIS — Z6841 Body Mass Index (BMI) 40.0 and over, adult: Secondary | ICD-10-CM

## 2014-12-30 DIAGNOSIS — R197 Diarrhea, unspecified: Secondary | ICD-10-CM

## 2014-12-30 DIAGNOSIS — T451X5A Adverse effect of antineoplastic and immunosuppressive drugs, initial encounter: Secondary | ICD-10-CM

## 2014-12-30 DIAGNOSIS — Z452 Encounter for adjustment and management of vascular access device: Secondary | ICD-10-CM | POA: Diagnosis not present

## 2014-12-30 DIAGNOSIS — D500 Iron deficiency anemia secondary to blood loss (chronic): Secondary | ICD-10-CM

## 2014-12-30 DIAGNOSIS — C775 Secondary and unspecified malignant neoplasm of intrapelvic lymph nodes: Secondary | ICD-10-CM | POA: Diagnosis not present

## 2014-12-30 DIAGNOSIS — R8290 Unspecified abnormal findings in urine: Secondary | ICD-10-CM

## 2014-12-30 DIAGNOSIS — N852 Hypertrophy of uterus: Secondary | ICD-10-CM | POA: Diagnosis not present

## 2014-12-30 DIAGNOSIS — D6481 Anemia due to antineoplastic chemotherapy: Secondary | ICD-10-CM

## 2014-12-30 DIAGNOSIS — C772 Secondary and unspecified malignant neoplasm of intra-abdominal lymph nodes: Secondary | ICD-10-CM

## 2014-12-30 DIAGNOSIS — R600 Localized edema: Secondary | ICD-10-CM

## 2014-12-30 LAB — CBC WITH DIFFERENTIAL/PLATELET
BASO%: 0.4 % (ref 0.0–2.0)
BASOS ABS: 0 10*3/uL (ref 0.0–0.1)
EOS%: 13.6 % — AB (ref 0.0–7.0)
Eosinophils Absolute: 0.7 10*3/uL — ABNORMAL HIGH (ref 0.0–0.5)
HEMATOCRIT: 37.8 % (ref 34.8–46.6)
HEMOGLOBIN: 12.3 g/dL (ref 11.6–15.9)
LYMPH%: 6.5 % — AB (ref 14.0–49.7)
MCH: 27.5 pg (ref 25.1–34.0)
MCHC: 32.5 g/dL (ref 31.5–36.0)
MCV: 84.6 fL (ref 79.5–101.0)
MONO#: 0.4 10*3/uL (ref 0.1–0.9)
MONO%: 8.1 % (ref 0.0–14.0)
NEUT#: 3.6 10*3/uL (ref 1.5–6.5)
NEUT%: 71.4 % (ref 38.4–76.8)
Platelets: 223 10*3/uL (ref 145–400)
RBC: 4.47 10*6/uL (ref 3.70–5.45)
RDW: 15.2 % — AB (ref 11.2–14.5)
WBC: 5.1 10*3/uL (ref 3.9–10.3)
lymph#: 0.3 10*3/uL — ABNORMAL LOW (ref 0.9–3.3)

## 2014-12-30 LAB — COMPREHENSIVE METABOLIC PANEL (CC13)
ALBUMIN: 3.7 g/dL (ref 3.5–5.0)
ALK PHOS: 72 U/L (ref 40–150)
ALT: 16 U/L (ref 0–55)
AST: 20 U/L (ref 5–34)
Anion Gap: 9 mEq/L (ref 3–11)
BILIRUBIN TOTAL: 0.68 mg/dL (ref 0.20–1.20)
BUN: 15.1 mg/dL (ref 7.0–26.0)
CHLORIDE: 108 meq/L (ref 98–109)
CO2: 25 mEq/L (ref 22–29)
CREATININE: 0.7 mg/dL (ref 0.6–1.1)
Calcium: 9.6 mg/dL (ref 8.4–10.4)
EGFR: >90 mL/min/{1.73_m2} (ref >90)
GLUCOSE: 91 mg/dL (ref 70–140)
Potassium: 4.4 mEq/L (ref 3.5–5.1)
SODIUM: 142 meq/L (ref 136–145)
TOTAL PROTEIN: 7.1 g/dL (ref 6.4–8.3)

## 2014-12-30 LAB — URINALYSIS, MICROSCOPIC - CHCC
BILIRUBIN (URINE): NEGATIVE
Blood: NEGATIVE
Glucose: NEGATIVE mg/dL
Ketones: NEGATIVE mg/dL
Nitrite: NEGATIVE
PROTEIN: NEGATIVE mg/dL
SPECIFIC GRAVITY, URINE: 1.01 (ref 1.003–1.035)
Urobilinogen, UR: 0.2 mg/dL (ref 0.2–1)
pH: 7.5 (ref 4.6–8.0)

## 2014-12-30 MED ORDER — SODIUM CHLORIDE 0.9 % IJ SOLN
10.0000 mL | INTRAMUSCULAR | Status: DC | PRN
  Administered 2014-12-30: 10 mL via INTRAVENOUS
  Filled 2014-12-30: qty 10

## 2014-12-30 MED ORDER — HEPARIN SOD (PORK) LOCK FLUSH 100 UNIT/ML IV SOLN
500.0000 [IU] | Freq: Once | INTRAVENOUS | Status: AC
  Administered 2014-12-30: 500 [IU] via INTRAVENOUS
  Filled 2014-12-30: qty 5

## 2014-12-30 NOTE — Telephone Encounter (Signed)
Appointments made and avs printed for patient °

## 2014-12-30 NOTE — Progress Notes (Signed)
Erin Santiago has completed 21 fractions to her pelvis.  She denies pain except for chronic knee pain.  She denies urinary frequency, dysuria and hematuria.  She does report her urine has smelled "funny" the past few days.  She reports having diarrhea 2-3 times since Saturday.  She is going to start taking Imodium.  She reports a good appetite and denies having any nausea.  She has lost 2 lbs since last week.  She denies having any vaginal bleeding and skin irritation.  She reports feeling slight fatigue.  BP 139/67 mmHg  Pulse 73  Temp(Src) 98 F (36.7 C) (Oral)  Resp 16  Ht 5' (1.524 m)  Wt 229 lb 9.6 oz (104.146 kg)  BMI 44.84 kg/m2  LMP 02/04/2014

## 2014-12-30 NOTE — Progress Notes (Signed)
  Radiation Oncology         [phone] ________________________________  Name: Erin Santiago MRN: [id number]  Date: 12/30/2014  DOB: 09-Apr-1956  Weekly Radiation Therapy Management    ICD-9-CM ICD-10-CM   1. Endometrial stromal sarcoma 182.0 C54.1     Current Dose: 37.8 Gy     Planned Dose:  45 Gy  Narrative . . . . . . . . The patient presents for routine under treatment assessment.                                   The patient is without complaint. She denies pain except for chronic knee pain. She denies urinary frequency, dysuria, cloudy urine, and hematuria. She does report her urine has smelled "funny" the past few days. She reports having diarrhea 2-3 times since Saturday. She is going to start taking Imodium. She reports a good appetite and denies having any nausea. She has lost 2 lbs since last week. She denies having any vaginal bleeding and skin irritation. She reports feeling slight fatigue.                                 Set-up films were reviewed.                                 The chart was checked. Physical Findings. . .  height is 5' (1.524 m) and weight is 229 lb 9.6 oz (104.146 kg). Her oral temperature is 98 F (36.7 C). Her blood pressure is 139/67 and her pulse is 73. Her respiration is 16. . The lungs are clear. The heart has a regular rhythm and rate. The abdomen is soft and nontender with normal bowel sounds.  Impression . . . . . . . The patient is tolerating radiation. Plan . . . . . . . . . . . . Continue treatment as planned. Ordered a urinalysis and culture today. No plans for additional surgery at this time.  This document serves as a record of services personally performed by [name], MD. It was created on his behalf by Arlyce Harman, a trained medical scribe. The creation of this record is based on the scribe's personal observations and the provider's statements to them. This document has been checked and approved by the attending  provider. ________________________________   Blair Promise, PhD, MD

## 2014-12-30 NOTE — Patient Instructions (Signed)
Imodium   2 tablets after each loose stool up to 8 tablets in 24 hrs.  If still having diarrhea with that dose of imodium, let radiation oncology know.

## 2014-12-30 NOTE — Progress Notes (Signed)
OFFICE PROGRESS NOTE   December 30, 2014   Physicians:Emma Rossi/ Nila Nephew, Delfino Lovett, MD no longer at [facility], does not know new PCP); [name]; [name], [name], [name], [name] [facility] sarcoma clinic)  [facility] sarcoma clinic notes reviewed, and I also spoke directly with Dr Faythe Ghee following that visit.   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 She began IMRT by Dr Sondra Come to pelvic nodal regions in early July, this planned thru 01-05-15. LE swelling is already improved since start of radiation. She saw Dr Faythe Ghee in consultation 12-09-14, who discussed situation also with Dr Marlaine Hind. Dr Faythe Ghee recommends close observation after radiation until obvious progression, at which time would resume systemic treatment, on or off study depending on options then; molecular testing is underway at [facility]. Patient does not have scheduled follow up with Dr Faythe Ghee, but will be seen back prn.  Patient is feeling better overall since start of radiation, tho she is having some diarrhea (not using imodium optimally, discussed) and general fatigue. 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 She denies nausea or SOB now.    PAC in, flushed today LE venous dopplers 10-23-14 negative for DVT Echocardiogram 08-25-14 had EF 55-60%.  ONCOLOGIC HISTORY Patient had been menopausal since age 86 until she had what seemed to her to be a menstrual period in Sept 2015, with large blood clot at completion of bleeding stopped then. She continued with lesser bleeding over next several months until she was seen in 06-2014 by [name]. Hemoglobin was 12.7 on 07-04-14. CT CAP 06-25-14 had uterus 16.4 x 13.5 x 14.4 cm with marked expansion of endometrial canal by  heterogeneous mass, bilateral pelvic sidewall adenopathy, iliac adenopathy with no definite retroperitoneal or mesenteric adenopathy, no ascites, no liver mets. Attempted endometrial sampling 06-26-14 and 07-03-14 was nondiagnostic; around that time the patient was passing clots and using one large maxipad hourly. She was seen by [name] 07-05-14, with uterine fundus palpable above umbilicus. She had surgery by [name] at [facility] on 07-14-14, which was exploratory laparotomy with TAH BSO, bilateral total pelvic lymphadenectomy and sampling of aortic and renal nodes. Pathology [facility] [phone]) found high grade endometrial stromal sarcoma with primary 18 cm, 8/45 nodes involved including 2 right pelvic and 6 left pelvic nodes; IHC for ER PR was to be done at [facility]. Surgery was uncomplicated and she was DC home on ~ POD #3 with lovenox x 28 days. Case was presented at [facility] multidisciplinary conference 07-23-14, with recommendation for PET CT to evaluate inguinal and portahepatis nodes, and to consider adjuvant gemzar taxotere and possibly follow with 4 cycles of adriamycin, then to consider whole pelvic RT. She saw [name] for post op follow up on 07-28-14, with recommendation for gemzar taxotere and adriamycin, whole pelvic RT after chemo and consideration of Megace maintenance after chemo. PET 08-01-14 had some uptake in aortocaval and left paraaortic regions. She had day 1 cycle 1 gemzar taxotere on 08-28-14, day 8 cycle 1 on 09-04-14 and neulasta on 09-06-14. Admitted with neutropenic fever on day 14 cycle 1, with oral mucositis and some diarrhea. Counts maintained cycle 2 using OnPro neulasta day 9. She had progressive LE swelling after cycle 3, with venous dopplers negative 10-23-14, then LE swelling and SOB so marked after day  1 cycle 4 that chemo was held. Restaging CT AP + CXR 11-10-14 had stable tiny left pulmonary nodule/ no pleural effusion and normal heart size; CT had necrotic aortocaval adenopathy, iliac  adenopathy L>R and 7x10 cm fluid collection in pelvis. IMRT planned thru 01-05-15.     Review of systems as above, also: No fever, no abdominal or pelvic pain. Voiding without difficulty. Sleeping well. Appetite better. Energy better Remainder of 10 point Review of Systems negative.  Objective:  Vital signs in last 24 hours:  BP 127/73 mmHg  Pulse 78  Temp(Src) 98.3 F (36.8 C) (Oral)  Resp 18  Ht 5' (1.524 m)  Wt 229 lb 9.6 oz (104.146 kg)  BMI 44.84 kg/m2  LMP 02/04/2014 Weight down 18 lbs from 11-27-14. Alert, oriented and appropriate. Ambulatory without assistance. Respirations not labored RA.  Hair starting to grow back.  HEENT:PERRL, sclerae not icteric. Oral mucosa moist without lesions, posterior pharynx clear.  Neck supple. No JVD.  Lymphatics:no cervical,supraclavicular, axillary or inguinal adenopathy Resp: clear to auscultation bilaterally and normal percussion bilaterally Cardio: regular rate and rhythm. No gallop. GI: abdomen obese, soft, nontender, not distended, cannot appreciate mass or organomegaly. Normally active bowel sounds. Surgical incision not remarkable. Musculoskeletal/ Extremities: Swelling still 1-2+ in LE but not tight, without cords, tenderness Neuro: speech fluent and appropriate, moves all extremities easily. PSYCH mood and affect much brighter Skin without rash, ecchymosis, petechiae Portacath-without erythema or tenderness  Lab Results:  Results for orders placed or performed in visit on 12/30/14  CBC with Differential  Result Value Ref Range   WBC 5.1 3.9 - 10.3 10e3/uL   NEUT# 3.6 1.5 - 6.5 10e3/uL   HGB 12.3 11.6 - 15.9 g/dL   HCT 37.8 34.8 - 46.6 %   Platelets 223 145 - 400 10e3/uL   MCV 84.6 79.5 - 101.0 fL   MCH 27.5 25.1 - 34.0 pg   MCHC 32.5 31.5 - 36.0 g/dL   RBC 4.47 3.70 - 5.45 10e6/uL   RDW 15.2 (H) 11.2 - 14.5 %   lymph# 0.3 (L) 0.9 - 3.3 10e3/uL   MONO# 0.4 0.1 - 0.9 10e3/uL   Eosinophils Absolute 0.7 (H) 0.0 - 0.5  10e3/uL   Basophils Absolute 0.0 0.0 - 0.1 10e3/uL   NEUT% 71.4 38.4 - 76.8 %   LYMPH% 6.5 (L) 14.0 - 49.7 %   MONO% 8.1 0.0 - 14.0 %   EOS% 13.6 (H) 0.0 - 7.0 %   BASO% 0.4 0.0 - 2.0 %  Comprehensive metabolic panel (Cmet) - [facility]  Result Value Ref Range   Sodium 142 136 - 145 mEq/L   Potassium 4.4 3.5 - 5.1 mEq/L   Chloride 108 98 - 109 mEq/L   CO2 25 22 - 29 mEq/L   Glucose 91 70 - 140 mg/dl   BUN 15.1 7.0 - 26.0 mg/dL   Creatinine 0.7 0.6 - 1.1 mg/dL   Total Bilirubin 0.68 0.20 - 1.20 mg/dL   Alkaline Phosphatase 72 40 - 150 U/L   AST 20 5 - 34 U/L   ALT 16 0 - 55 U/L   Total Protein 7.1 6.4 - 8.3 g/dL   Albumin 3.7 3.5 - 5.0 g/dL   Calcium 9.6 8.4 - 10.4 mg/dL   Anion Gap 9 3 - 11 mEq/L   EGFR >90 >90 ml/min/1.73 m2     Studies/Results:  No results found.  Medications: I have reviewed the patient's current medications.  DISCUSSION: discussed increasing imodium to 2 tablets after  each loose stool not to excees 8 in 24 hrs, and letting MD know if this is not sufficient for diarrhea from radiation. Push fluids any time diarrhea.  Discussed improvement in LE swelling off chemo/ with RT. She is so pleased that this is better and that she has been able to work with her animals again. Discussed recommendation to follow with close observation including repeat scans after RT, but no additional systemic treatment until such time as we can tell progression of disease. She understands that treatment is in attempt to control symptoms and will not be curative.  She is glad to keep PAC, understands that it needs to be flushed q 6-8 weeks.  Assessment/Plan:  1.high grade endometrial stromal sarcoma with bilateral pelvic node involvement at TAH BSO, pelvic lymphadenectomy and node sampling at [facility] 07-14-14. Cycle 1 gemzar taxotere begun 08-28-14, thru day 1 cycle 4 on 10-30-14. Repeat CT AP due to progressive LE swelling has increased aortocaval and bilateral iliac adenopathy as well as fluid  in left pelvis. Appreciate Dr Clabe Seal help with IMRT and consultation by Dr Faythe Ghee. She will have CT early Sept and I will see her back shortly after that scan.  2. LE edema after taxotere 10-20-14, venous dopplers negative and good EF. Appears primarily from progressive adenopathy, tho may have had some fluid retention from taxotere, and possibly some from Mobic. Swelling improved since start of RT. 3.SOB and fatigue likely multifactorial with gemzar fatigue, anemia, LE edema. Also better 4.diarrhea likely from RT. Plan as above. (has not had C diff testing, no recent antibiotics, but could do this also if symptoms worsen). 5.morbid obesity, BMI 44. post gastric banding with initial weight loss 50 lbs 6.degenerative arthritis knees: symptoms better with interventions by orthopedics prior to start of chemo. Less symptomatic with improvement in LE swelling. 7.PAC in 8.chronic alopecia such that hair loss with chemo is not concerning to her 9.no flu vaccine 10.history skin ca, apparently nonmelanoma 11,iron deficiency anemia related to gyn bleeding: on ferrous fumarate in preparation that includes vit C, hgb better at 11.3 today, iron stores documented low. Continue oral iron    All questions answered and patient is in agreement with plans as above. Time spent 30 min including >50% counseling and coordination of care.   Claudy Abdallah P, MD   12/30/2014, 12:56 PM

## 2014-12-30 NOTE — Patient Instructions (Signed)

## 2014-12-31 ENCOUNTER — Other Ambulatory Visit: Payer: Self-pay | Admitting: Oncology

## 2014-12-31 ENCOUNTER — Encounter: Payer: Self-pay | Admitting: Oncology

## 2014-12-31 ENCOUNTER — Ambulatory Visit
Admission: RE | Admit: 2014-12-31 | Discharge: 2014-12-31 | Disposition: A | Discharge status: Home / Self Care | Payer: Federal, State, Local not specified - PPO | Source: Ambulatory Visit | Attending: Radiation Oncology | Admitting: Radiation Oncology

## 2014-12-31 DIAGNOSIS — R8290 Unspecified abnormal findings in urine: Secondary | ICD-10-CM

## 2014-12-31 DIAGNOSIS — C541 Malignant neoplasm of endometrium: Secondary | ICD-10-CM | POA: Diagnosis not present

## 2014-12-31 NOTE — Progress Notes (Signed)
I placed disability forms at front desk for patient to pick up.

## 2015-01-01 ENCOUNTER — Ambulatory Visit
Admission: RE | Admit: 2015-01-01 | Discharge: 2015-01-01 | Disposition: A | Discharge status: Home / Self Care | Payer: Federal, State, Local not specified - PPO | Source: Ambulatory Visit | Attending: Radiation Oncology | Admitting: Radiation Oncology

## 2015-01-01 DIAGNOSIS — C541 Malignant neoplasm of endometrium: Secondary | ICD-10-CM | POA: Diagnosis not present

## 2015-01-01 LAB — URINE CULTURE

## 2015-01-02 ENCOUNTER — Ambulatory Visit
Admission: RE | Admit: 2015-01-02 | Discharge: 2015-01-02 | Disposition: A | Discharge status: Home / Self Care | Payer: Federal, State, Local not specified - PPO | Source: Ambulatory Visit | Attending: Radiation Oncology | Admitting: Radiation Oncology

## 2015-01-02 ENCOUNTER — Telehealth: Payer: Self-pay | Admitting: Oncology

## 2015-01-02 DIAGNOSIS — C541 Malignant neoplasm of endometrium: Secondary | ICD-10-CM | POA: Diagnosis not present

## 2015-01-02 NOTE — Telephone Encounter (Signed)
Called Erin Santiago and informed her of her good urine culture results per Dr. Sondra Come.  [name] verbalized understanding.

## 2015-01-05 ENCOUNTER — Encounter: Payer: Self-pay | Admitting: Radiation Oncology

## 2015-01-05 ENCOUNTER — Ambulatory Visit
Admission: RE | Admit: 2015-01-05 | Discharge: 2015-01-05 | Disposition: A | Discharge status: Home / Self Care | Payer: Federal, State, Local not specified - PPO | Source: Ambulatory Visit | Attending: Radiation Oncology | Admitting: Radiation Oncology

## 2015-01-05 VITALS — BP 146/88 | HR 77 | Temp 98.6°F | Wt 228.6 lb

## 2015-01-05 DIAGNOSIS — C541 Malignant neoplasm of endometrium: Secondary | ICD-10-CM | POA: Diagnosis not present

## 2015-01-05 NOTE — Progress Notes (Signed)
Department of Radiation Oncology  Phone:  [phone] Fax:        [phone]  Weekly Treatment Note    Name: Erin Santiago Date: 01/05/2015 MRN: [id number] DOB: 27-Apr-1956   Current dose: 45 Gy  Current fraction:25   MEDICATIONS: Current Outpatient Prescriptions  Medication Sig Dispense Refill  . docusate sodium (COLACE) 100 MG capsule Take 100 mg by mouth 2 (two) times daily.     . hyaluronate sodium (RADIAPLEXRX) GEL Apply 1 application topically 2 (two) times daily.    Marland Kitchen lidocaine-prilocaine (EMLA) cream Apply to Porta-cath 1-2 hrs prior to access. Cover with [employer]. 30 g 1  . loperamide (IMODIUM) 2 MG capsule Take 2-4 mg by mouth 4 (four) times daily as needed for diarrhea or loose stools.    . naproxen sodium (ANAPROX) 220 MG tablet Take 220 mg by mouth 2 (two) times daily with a meal.    . acyclovir (ZOVIRAX) 200 MG capsule Take 1 capsule (200 mg total) by mouth 3 (three) times daily. For mouth ulcers during chemo (Patient not taking: Reported on 11/18/2014) 60 capsule 1  . Alum & Mag Hydroxide-Simeth (MAGIC MOUTHWASH W/LIDOCAINE) SOLN QID prn for mouth/throat irriation- may swish swallow and or spit (Patient not taking: Reported on 09/25/2014) 240 mL 0  . antiseptic oral rinse (BIOTENE) LIQD 15 mLs by Mouth Rinse route 4 (four) times daily. (Patient not taking: Reported on 11/18/2014) 237 mL 0  . dexamethasone (DECADRON) 4 MG tablet Take 2 tabs with food twice a day x 3 days beginning day prior to Taxotere(Second week of Chemo) (Patient not taking: Reported on 11/18/2014) 12 tablet 2  . ferrous fumarate (HEMOCYTE) 325 (106 FE) MG TABS tablet Take 1 tab daily on an empty stomach with OJ or Vitamin C 500 mg (Patient not taking: Reported on 11/27/2014) 30 each 3  . fluconazole (DIFLUCAN) 100 MG tablet Take along with steroids aroud Taxotere (Patient not taking: Reported on 11/19/2014) 12 tablet 1  . furosemide (LASIX) 20 MG tablet Take 1/2 tablet today for LE swelling, can take  1/2 tablet this weekend if swelling is not better. (Patient not taking: Reported on 11/18/2014) 3 tablet 0  . hydrocortisone 2.5 % cream Apply 1 application topically as needed (itch/psoriasis.).     Marland Kitchen LORazepam (ATIVAN) 1 MG tablet Place 1/2-1 tablet under the tongue or swallow every 6 hrs as needed for nausea. Will make you drowsy. (Patient not taking: Reported on 11/27/2014) 20 tablet 0  . meloxicam (MOBIC) 15 MG tablet Take 15 mg by mouth daily.   3  . ondansetron (ZOFRAN ODT) 8 MG disintegrating tablet Take 1 tablet (8 mg total) by mouth every 8 (eight) hours as needed for nausea or vomiting. (Patient not taking: Reported on 09/15/2014) 30 tablet 1  . Psyllium 28.3 % POWD Take 1 packet by mouth daily as needed.     Marland Kitchen Resveratrol 250 MG CAPS Take 250 mg by mouth 2 (two) times daily.    . sodium chloride (OCEAN) 0.65 % SOLN nasal spray Place 1 spray into both nostrils as needed for congestion. (Patient not taking: Reported on 12/09/2014) 30 mL 0  . traMADol (ULTRAM) 50 MG tablet May take 25-50 mg PO BID PRN. (1/2 to 1 whole tab) (Patient not taking: Reported on 11/18/2014) 20 tablet 0   No current facility-administered medications for this encounter.     ALLERGIES: Review of patient's allergies indicates no known allergies.   LABORATORY DATA:  Lab Results  Component Value Date  WBC 5.1 12/30/2014   HGB 12.3 12/30/2014   HCT 37.8 12/30/2014   MCV 84.6 12/30/2014   PLT 223 12/30/2014   Lab Results  Component Value Date   NA 142 12/30/2014   K 4.4 12/30/2014   CL 108 09/18/2014   CO2 25 12/30/2014   Lab Results  Component Value Date   ALT 16 12/30/2014   AST 20 12/30/2014   ALKPHOS 72 12/30/2014   BILITOT 0.68 12/30/2014     NARRATIVE: Erin Santiago was seen today for weekly treatment management. The chart was checked and the patient's films were reviewed. Patient completes 25 of 25 treatments to pelvis.Denies pain.States she has no skin issues.Informed patient radiation is active  2 more weeks and then she will start to heal.Knows to call if any skin problems develop.Given one month follow up card. Urine culture from 12/30/14 had insignificant growth.  PHYSICAL EXAMINATION: weight is 228 lb 9.6 oz (103.692 kg). Her temperature is 98.6 F (37 C). Her blood pressure is 146/88 and her pulse is 77. Her oxygen saturation is 100%.     ASSESSMENT: The patient did satisfactorily with treatment.  She completed her course of radiation treatment today.  PLAN: The patient will follow-up in our clinic in 1 month.    ------------------------------------------------  Jodelle Gross, MD, PhD  This document serves as a record of services personally performed by Kyung Rudd, MD. It was created on his behalf by Derek Mound, a trained medical scribe. The creation of this record is based on the scribe's personal observations and the provider's statements to them. This document has been checked and approved by the attending provider.

## 2015-01-05 NOTE — Progress Notes (Signed)
BP 146/88 mmHg  Pulse 77  Temp(Src) 98.6 F (37 C)  Wt 228 lb 9.6 oz (103.692 kg)  SpO2 100%  LMP 02/04/2014  Patient completes 25 of 25 treatments to pelvis.Denies pain.States she has no skin issues.Informed patient radiation is active 2 more weeks and then she will start to heal.Knows to call if any skin problems develop.Given one month follow up card.urine culture from  12/30/14 had insignificant growth.

## 2015-01-14 NOTE — Progress Notes (Signed)
  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   She did experience some fatigue as well as some mild problems with diarrhea. Her swelling in lower extremities improved during the course of her treatments area  Plan: The patient has completed radiation treatment. The patient will return to radiation oncology clinic for routine followup in one month. I advised them to call or return sooner if they have any questions or concerns related to their recovery or treatment.  -----------------------------------  Blair Promise, PhD, MD

## 2015-01-29 ENCOUNTER — Other Ambulatory Visit: Payer: Self-pay | Admitting: Oncology

## 2015-01-29 ENCOUNTER — Encounter: Payer: Self-pay | Admitting: Oncology

## 2015-01-29 DIAGNOSIS — C541 Malignant neoplasm of endometrium: Secondary | ICD-10-CM

## 2015-01-30 ENCOUNTER — Other Ambulatory Visit: Payer: Self-pay

## 2015-01-30 DIAGNOSIS — C541 Malignant neoplasm of endometrium: Secondary | ICD-10-CM

## 2015-01-30 NOTE — Telephone Encounter (Signed)
Called Erin Santiago and told her that Erin Santiago has an  order in the computer for Scans to be done hopefully on 02-02-15.  The scans need prior authorization before they can be scheduled.  This process can take a few days.  If she has not heard from central scheduling by midday  Tuesday to call and leave a message for Erin Santiago nurse to follow up.  Erin Santiago appreciated return call and information.

## 2015-02-01 ENCOUNTER — Other Ambulatory Visit: Payer: Self-pay | Admitting: Oncology

## 2015-02-01 DIAGNOSIS — C541 Malignant neoplasm of endometrium: Secondary | ICD-10-CM

## 2015-02-02 ENCOUNTER — Other Ambulatory Visit: Payer: Federal, State, Local not specified - PPO

## 2015-02-02 ENCOUNTER — Telehealth: Payer: Self-pay | Admitting: Oncology

## 2015-02-02 NOTE — Telephone Encounter (Signed)
Left message to return call to confirm appointment/instructions for CT scan.

## 2015-02-03 ENCOUNTER — Ambulatory Visit (HOSPITAL_COMMUNITY)
Admission: RE | Admit: 2015-02-03 | Discharge: 2015-02-03 | Disposition: A | Discharge status: Home / Self Care | Payer: Federal, State, Local not specified - PPO | Source: Ambulatory Visit | Attending: Oncology | Admitting: Oncology

## 2015-02-03 ENCOUNTER — Other Ambulatory Visit (HOSPITAL_BASED_OUTPATIENT_CLINIC_OR_DEPARTMENT_OTHER): Payer: Federal, State, Local not specified - PPO

## 2015-02-03 ENCOUNTER — Ambulatory Visit (HOSPITAL_BASED_OUTPATIENT_CLINIC_OR_DEPARTMENT_OTHER): Payer: Federal, State, Local not specified - PPO

## 2015-02-03 DIAGNOSIS — C541 Malignant neoplasm of endometrium: Secondary | ICD-10-CM

## 2015-02-03 DIAGNOSIS — Z08 Encounter for follow-up examination after completed treatment for malignant neoplasm: Secondary | ICD-10-CM | POA: Insufficient documentation

## 2015-02-03 DIAGNOSIS — R918 Other nonspecific abnormal finding of lung field: Secondary | ICD-10-CM | POA: Insufficient documentation

## 2015-02-03 DIAGNOSIS — I998 Other disorder of circulatory system: Secondary | ICD-10-CM | POA: Insufficient documentation

## 2015-02-03 DIAGNOSIS — Z452 Encounter for adjustment and management of vascular access device: Secondary | ICD-10-CM | POA: Diagnosis not present

## 2015-02-03 DIAGNOSIS — D500 Iron deficiency anemia secondary to blood loss (chronic): Secondary | ICD-10-CM

## 2015-02-03 DIAGNOSIS — Z90710 Acquired absence of both cervix and uterus: Secondary | ICD-10-CM | POA: Diagnosis not present

## 2015-02-03 DIAGNOSIS — Z90722 Acquired absence of ovaries, bilateral: Secondary | ICD-10-CM | POA: Diagnosis not present

## 2015-02-03 DIAGNOSIS — C772 Secondary and unspecified malignant neoplasm of intra-abdominal lymph nodes: Secondary | ICD-10-CM | POA: Diagnosis not present

## 2015-02-03 DIAGNOSIS — Z95828 Presence of other vascular implants and grafts: Secondary | ICD-10-CM

## 2015-02-03 LAB — COMPREHENSIVE METABOLIC PANEL (CC13)
ALT: 16 U/L (ref 0–55)
AST: 19 U/L (ref 5–34)
Albumin: 3.7 g/dL (ref 3.5–5.0)
Alkaline Phosphatase: 79 U/L (ref 40–150)
Anion Gap: 10 mEq/L (ref 3–11)
BUN: 18.9 mg/dL (ref 7.0–26.0)
CO2: 25 meq/L (ref 22–29)
CREATININE: 0.7 mg/dL (ref 0.6–1.1)
Calcium: 9.6 mg/dL (ref 8.4–10.4)
Chloride: 106 mEq/L (ref 98–109)
EGFR: 90 mL/min/{1.73_m2} — ABNORMAL LOW (ref >90)
GLUCOSE: 83 mg/dL (ref 70–140)
Potassium: 4.3 mEq/L (ref 3.5–5.1)
Sodium: 141 mEq/L (ref 136–145)
Total Bilirubin: 0.78 mg/dL (ref 0.20–1.20)
Total Protein: 7.3 g/dL (ref 6.4–8.3)

## 2015-02-03 LAB — CBC WITH DIFFERENTIAL/PLATELET
BASO%: 0.4 % (ref 0.0–2.0)
Basophils Absolute: 0 10*3/uL (ref 0.0–0.1)
EOS%: 8.3 % — AB (ref 0.0–7.0)
Eosinophils Absolute: 0.4 10*3/uL (ref 0.0–0.5)
HEMATOCRIT: 38.4 % (ref 34.8–46.6)
HGB: 12.7 g/dL (ref 11.6–15.9)
LYMPH#: 0.5 10*3/uL — AB (ref 0.9–3.3)
LYMPH%: 9.5 % — AB (ref 14.0–49.7)
MCH: 26.8 pg (ref 25.1–34.0)
MCHC: 33.1 g/dL (ref 31.5–36.0)
MCV: 81 fL (ref 79.5–101.0)
MONO#: 0.4 10*3/uL (ref 0.1–0.9)
MONO%: 7.5 % (ref 0.0–14.0)
NEUT%: 74.3 % (ref 38.4–76.8)
NEUTROS ABS: 3.7 10*3/uL (ref 1.5–6.5)
Platelets: 236 10*3/uL (ref 145–400)
RBC: 4.74 10*6/uL (ref 3.70–5.45)
RDW: 14.5 % (ref 11.2–14.5)
WBC: 4.9 10*3/uL (ref 3.9–10.3)

## 2015-02-03 MED ORDER — IOHEXOL 300 MG/ML  SOLN
100.0000 mL | Freq: Once | INTRAMUSCULAR | Status: AC | PRN
  Administered 2015-02-03: 100 mL via INTRAVENOUS

## 2015-02-03 MED ORDER — SODIUM CHLORIDE 0.9 % IJ SOLN
10.0000 mL | INTRAMUSCULAR | Status: DC | PRN
  Administered 2015-02-03: 10 mL via INTRAVENOUS
  Filled 2015-02-03: qty 10

## 2015-02-03 NOTE — Patient Instructions (Signed)
Pt to have port needle deaccessed after CT scan today 02/03/15 before leaving [facility]/ [facility].  Implanted [facility] Guide An implanted port is a type of central line that is placed under the skin. Central lines are used to provide IV access when treatment or nutrition needs to be given through a person's veins. Implanted ports are used for long-term IV access. An implanted port may be placed because:   You need IV medicine that would be irritating to the small veins in your hands or arms.   You need long-term IV medicines, such as antibiotics.   You need IV nutrition for a long period.   You need frequent blood draws for lab tests.   You need dialysis.  Implanted ports are usually placed in the chest area, but they can also be placed in the upper arm, the abdomen, or the leg. An implanted port has two main parts:   Reservoir. The reservoir is round and will appear as a small, raised area under your skin. The reservoir is the part where a needle is inserted to give medicines or draw blood.   Catheter. The catheter is a thin, flexible tube that extends from the reservoir. The catheter is placed into a large vein. Medicine that is inserted into the reservoir goes into the catheter and then into the vein.  HOW WILL I CARE FOR MY INCISION SITE? Do not get the incision site wet. Bathe or shower as directed by your health care provider.  HOW IS MY PORT ACCESSED? Special steps must be taken to access the port:   Before the port is accessed, a numbing cream can be placed on the skin. This helps numb the skin over the port site.   Your health care provider uses a sterile technique to access the port.  Your health care provider must put on a mask and sterile gloves.  The skin over your port is cleaned carefully with an antiseptic and allowed to dry.  The port is gently pinched between sterile gloves, and a needle is inserted into the port.  Only "non-coring" port needles should be  used to access the port. Once the port is accessed, a blood return should be checked. This helps ensure that the port is in the vein and is not clogged.   If your port needs to remain accessed for a constant infusion, a clear (transparent) bandage will be placed over the needle site. The bandage and needle will need to be changed every week, or as directed by your health care provider.   Keep the bandage covering the needle clean and dry. Do not get it wet. Follow your health care provider's instructions on how to take a shower or bath while the port is accessed.   If your port does not need to stay accessed, no bandage is needed over the port.  WHAT IS FLUSHING? Flushing helps keep the port from getting clogged. Follow your health care provider's instructions on how and when to flush the port. Ports are usually flushed with saline solution or a medicine called heparin. The need for flushing will depend on how the port is used.   If the port is used for intermittent medicines or blood draws, the port will need to be flushed:   After medicines have been given.   After blood has been drawn.   As part of routine maintenance.   If a constant infusion is running, the port may not need to be flushed.  HOW LONG  WILL MY PORT STAY IMPLANTED? The port can stay in for as long as your health care provider thinks it is needed. When it is time for the port to come out, surgery will be done to remove it. The procedure is similar to the one performed when the port was put in.  WHEN SHOULD I SEEK IMMEDIATE MEDICAL CARE? When you have an implanted port, you should seek immediate medical care if:   You notice a bad smell coming from the incision site.   You have swelling, redness, or drainage at the incision site.   You have more swelling or pain at the port site or the surrounding area.   You have a fever that is not controlled with medicine. Document Released: 05/23/2005 Document Revised:  03/13/2013 Document Reviewed: 01/28/2013 [facility] Patient Information 2015 [place], [place]. This information is not intended to replace advice given to you by your health care provider. Make sure you discuss any questions you have with your health care provider.

## 2015-02-04 ENCOUNTER — Encounter: Payer: Self-pay | Admitting: Oncology

## 2015-02-05 ENCOUNTER — Ambulatory Visit
Admission: RE | Admit: 2015-02-05 | Discharge: 2015-02-05 | Disposition: A | Discharge status: Home / Self Care | Payer: Federal, State, Local not specified - PPO | Source: Ambulatory Visit | Attending: Radiation Oncology | Admitting: Radiation Oncology

## 2015-02-05 ENCOUNTER — Encounter: Payer: Self-pay | Admitting: Oncology

## 2015-02-05 ENCOUNTER — Telehealth: Payer: Self-pay | Admitting: Oncology

## 2015-02-05 ENCOUNTER — Ambulatory Visit (HOSPITAL_BASED_OUTPATIENT_CLINIC_OR_DEPARTMENT_OTHER): Payer: Federal, State, Local not specified - PPO | Admitting: Oncology

## 2015-02-05 VITALS — BP 135/65 | HR 80 | Temp 98.0°F | Resp 20 | Ht 60.0 in | Wt 222.2 lb

## 2015-02-05 VITALS — BP 110/62 | HR 78 | Temp 97.9°F | Resp 18 | Ht 60.0 in | Wt 225.5 lb

## 2015-02-05 DIAGNOSIS — C772 Secondary and unspecified malignant neoplasm of intra-abdominal lymph nodes: Secondary | ICD-10-CM

## 2015-02-05 DIAGNOSIS — D500 Iron deficiency anemia secondary to blood loss (chronic): Secondary | ICD-10-CM | POA: Diagnosis not present

## 2015-02-05 DIAGNOSIS — Z6841 Body Mass Index (BMI) 40.0 and over, adult: Secondary | ICD-10-CM

## 2015-02-05 DIAGNOSIS — C775 Secondary and unspecified malignant neoplasm of intrapelvic lymph nodes: Secondary | ICD-10-CM

## 2015-02-05 DIAGNOSIS — C541 Malignant neoplasm of endometrium: Secondary | ICD-10-CM

## 2015-02-05 DIAGNOSIS — Z85828 Personal history of other malignant neoplasm of skin: Secondary | ICD-10-CM

## 2015-02-05 DIAGNOSIS — Z95828 Presence of other vascular implants and grafts: Secondary | ICD-10-CM

## 2015-02-05 DIAGNOSIS — R600 Localized edema: Secondary | ICD-10-CM

## 2015-02-05 DIAGNOSIS — C7989 Secondary malignant neoplasm of other specified sites: Secondary | ICD-10-CM

## 2015-02-05 NOTE — Progress Notes (Signed)
Follow up s/p radiation endometrial  Ca 12/01/14-01/05/15, no bleeding, or discharge stated, constipated every 3-4 days, taking psyllim otc for this; no bladder problems, woke up with pain the other night, had CT scan 02/03/15  Will see Dr. Marko Plume at 1pm today,  Appetite fine, energy level moderate better, 11:15 AM BP 135/65 mmHg  Pulse 80  Temp(Src) 98 F (36.7 C) (Oral)  Resp 20  Ht 5' (1.524 m)  Wt 222 lb 3.2 oz (100.789 kg)  BMI 43.40 kg/m2  LMP 02/04/2014  Wt Readings from Last 3 Encounters:  02/05/15 222 lb 3.2 oz (100.789 kg)  01/05/15 228 lb 9.6 oz (103.692 kg)  12/30/14 229 lb 9.6 oz (104.146 kg)

## 2015-02-05 NOTE — Progress Notes (Signed)
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  CT CAP 02-02-15 shows some decrease in retroperitoneal and left pelvic nodes as well as nodule at left oophorectomy site and the bilobed cystic lesion at left iliac vessels since radiation, with no progressive or new areas of concern including lungs. She saw Dr Sondra Come earlier today and will see him again in 3 months. We will keep Dr Faythe Ghee apprised of situation.  Plan is to wait until clear progressive disease before additional treatment, likely with adriamycin vs on study. She is on waiting list for [facility] equivalent of [facility] program.  Patient is feeling much better overall, back to usual activities since LE swelling resolved. She had constipation last week, then bowels moved well after CT contrast and abdomen feels much better now. She has been using fiber supplement only, may need to add miralax or senokot S etc. Appetite is good, no vomiting. No new or different pain. No bleeding. No SOB or chest discomfort. No problems with PAC.   PAC in, flushed today  She has begun application to retire. 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 She continued with lesser bleeding over next several months until she was seen in  06-2014 by [name]. Hemoglobin was 12.7 on 07-04-14. CT CAP 06-25-14 had uterus 16.4 x 13.5 x 14.4 cm with marked expansion of endometrial canal by heterogeneous mass, bilateral pelvic sidewall adenopathy, iliac adenopathy with no definite retroperitoneal or mesenteric adenopathy, no ascites, no liver mets. Attempted endometrial sampling 06-26-14 and 07-03-14 was nondiagnostic; around that time the patient was passing clots and using one large maxipad hourly. She was seen by [name] 07-05-14, with uterine fundus palpable above umbilicus. She had surgery by [name] at [facility] on 07-14-14, which was exploratory laparotomy with TAH BSO, bilateral total pelvic lymphadenectomy and sampling of aortic and renal nodes. Pathology [facility] [phone]) found high grade endometrial stromal sarcoma with primary 18 cm, 8/45 nodes involved including 2 right pelvic and 6 left pelvic nodes,  ER PR negative. Case was presented at [facility] multidisciplinary conference 07-23-14, with recommendation for PET CT to evaluate inguinal and portahepatis nodes, and to consider adjuvant gemzar taxotere and possibly follow with 4 cycles of adriamycin, then to consider whole pelvic RT. She saw [name] for post op follow up on 07-28-14, with recommendation for gemzar taxotere and adriamycin, whole pelvic RT after chemo and consideration of Megace maintenance after chemo (possibly prior to negative ER PR information). PET 08-01-14 had some uptake in aortocaval and left paraaortic regions. She had day 1 cycle 1 gemzar taxotere on 08-28-14, day 8 cycle 1 on 09-04-14 and neulasta on 09-06-14. Admitted with neutropenic fever on day 14 cycle 1, with oral mucositis and some diarrhea. Counts maintained cycle 2 using OnPro neulasta day 9. She had progressive LE  swelling after cycle 3, with venous dopplers negative 10-23-14, then LE swelling and SOB so marked after day 1 cycle 4 that chemo was held. Restaging CT AP + CXR 11-10-14 had stable tiny left pulmonary nodule/ no  pleural effusion and normal heart size; CT had necrotic aortocaval adenopathy, iliac adenopathy L>R and 7x10 cm fluid collection in pelvis. She received IMRT 45 gray in 25 fractions to pelvis from 6-27 thru 01-05-15, with resolution of LE swelling by completion of course. CT CAP showed improvement in retroperitoneal and pelvic involvement, tho still measurable areas.    Review of systems as above, also: No fever or symptoms of infection. No bladder symptoms.  Remainder of 10 point Review of Systems negative.  Objective:  Vital signs in last 24 hours:  BP 110/62 mmHg  Pulse 78  Temp(Src) 97.9 F (36.6 C) (Oral)  Resp 18  Ht 5' (1.524 m)  Wt 225 lb 8 oz (102.286 kg)  BMI 44.04 kg/m2  SpO2 99%  LMP 02/04/2014 Weight down 4 lbs with improvement in LE swelling. Looks comfortable, very pleasant as always, seems more energetic Alert, oriented and appropriate. Ambulatory without difficulty.  Hair is growing back.  HEENT:PERRL, sclerae not icteric. Oral mucosa moist without lesions, posterior pharynx clear.  Neck supple. No JVD.  Lymphatics:no cervical,supraclavicular, axillary or inguinal adenopathy Resp: clear to auscultation bilaterally and normal percussion bilaterally Cardio: regular rate and rhythm. No gallop. GI: abdomen obese, soft, nontender, not distended, cannot appreciate mass or organomegaly. Normally active bowel sounds. Surgical incision not remarkable. Musculoskeletal/ Extremities: LE without pitting edema, cords, tenderness Neuro:  nonfocal   PSYCH  Appropriate mood and affect Skin without rash, ecchymosis, petechiae Portacath-without erythema or tenderness  Lab Results:  Results for orders placed or performed in visit on 02/03/15  CBC with Differential  Result Value Ref Range   WBC 4.9 3.9 - 10.3 10e3/uL   NEUT# 3.7 1.5 - 6.5 10e3/uL   HGB 12.7 11.6 - 15.9 g/dL   HCT 13.4 87.5 - 03.2 %   Platelets 236 145 - 400 10e3/uL   MCV 81.0 79.5 - 101.0 fL   MCH 26.8 25.1 -  34.0 pg   MCHC 33.1 31.5 - 36.0 g/dL   RBC 5.60 1.29 - 4.20 10e6/uL   RDW 14.5 11.2 - 14.5 %   lymph# 0.5 (L) 0.9 - 3.3 10e3/uL   MONO# 0.4 0.1 - 0.9 10e3/uL   Eosinophils Absolute 0.4 0.0 - 0.5 10e3/uL   Basophils Absolute 0.0 0.0 - 0.1 10e3/uL   NEUT% 74.3 38.4 - 76.8 %   LYMPH% 9.5 (L) 14.0 - 49.7 %   MONO% 7.5 0.0 - 14.0 %   EOS% 8.3 (H) 0.0 - 7.0 %   BASO% 0.4 0.0 - 2.0 %  Comprehensive metabolic panel (Cmet) - [facility]  Result Value Ref Range   Sodium 141 136 - 145 mEq/L   Potassium 4.3 3.5 - 5.1 mEq/L   Chloride 106 98 - 109 mEq/L   CO2 25 22 - 29 mEq/L   Glucose 83 70 - 140 mg/dl   BUN 58.4 7.0 - 38.8 mg/dL   Creatinine 0.7 0.6 - 1.1 mg/dL   Total Bilirubin 3.20 0.20 - 1.20 mg/dL   Alkaline Phosphatase 79 40 - 150 U/L   AST 19 5 - 34 U/L   ALT 16 0 - 55 U/L   Total Protein 7.3 6.4 - 8.3 g/dL   Albumin 3.7 3.5 - 5.0 g/dL   Calcium 9.6 8.4 - 95.0 mg/dL  Anion Gap 10 3 - 11 mEq/L   EGFR 90 (L) >90 ml/min/1.73 m2     Studies/Results: CT CHEST, ABDOMEN, AND PELVIS WITH CONTRAST  02-03-15  TECHNIQUE: Multidetector CT imaging of the chest, abdomen and pelvis was performed following the standard protocol during bolus administration of intravenous contrast.  CONTRAST: 163mL OMNIPAQUE IOHEXOL 300 MG/ML SOLN  COMPARISON: CT 11/10/2014, PET-CT 08/01/2014  FINDINGS: CT CHEST FINDINGS  Mediastinum/Nodes: No axillary adenopathy. Nodule within the RIGHT lobe of thyroid gland measures 18 mm RIGHT not changed from comparison PET-CT scan. Lesion was not hypermetabolic at that time. No mediastinal lymphadenopathy. No pericardial fluid. No central pulmonary embolism. Esophagus normal  Lungs/Pleura: Small 3 mm nodule anterior to the LEFT oblique fissure on image 21, series 5 is unchanged from prior. Calcified nodule the RIGHT upper lobe on image 15.No new pulmonary nodules.  CT ABDOMEN AND PELVIS FINDINGS  Hepatobiliary: No focal hepatic lesion. No biliary duct  dilatation. Gallbladder is normal. Common bile duct is normal.  Pancreas: Pancreas is normal. No ductal dilatation. No pancreatic inflammation.  Spleen: Normal spleen  Adrenals/urinary tract: Adrenal glands and kidneys are normal. The ureters and bladder normal.  Stomach/Bowel: Post gastric bypass surgery. Small bowel and appendix are normal. Colon and rectosigmoid colon are normal.  Vascular/Lymphatic: Abdominal aorta is normal caliber.  Retroperitoneal lymph node position between the aorta and IVC measures 26 x 24 mm compared to 28 x 27 mm. LEFT common iliac lymph node measures 18 mm short axis compared to 18 mm. Nodule adjacent to the LEFT oophorectomy site measures 13 mm on image 98, series 2 decreased from 25 mm.  There is again demonstrated a bilobed cystic lesion along the LEFT iliac vessels measuring 9.6 x 6.5 cm compared to 10.2 x 7.2 cm.  No new adenopathy in the abdomen or pelvis. No peritoneal disease or omental studding.  Reproductive: Post hysterectomy and oophorectomy.  Musculoskeletal: No aggressive osseous lesion.  Other: midline ventral incision without evidence of abscess or infection.  IMPRESSION: Chest Impression:  1. No evidence of thoracic metastasis. 2. Stable small pulmonary nodules appear benign.  Abdomen / Pelvis Impression:  1. 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 2. Mild decrease in volume of cystic lesion along the LEFT iliac vessels. 3. No evidence disease progression the abdomen or pelvis. 4. Postsurgical change consistent with hysterectomy and oophorectomy.  PACs images reviewed by MD   Medications: I have reviewed the patient's current medications. Fine to use laxative daily or prn to keep bowels moving daily  DISCUSSION: Reviewed interval history and CT results with patient. Symptomatically much better since radiation. Patient understands recommendation to  watch closely until we can tell disease is progressive, then resume some treatment in palliative attempt, either adriamycin (prefer this out as far from radiation as possible) or on study.  NOTE ER PR negative on initial path, so hormonal "maintenance" therapy not discussed.   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 Cycle 1 gemzar taxotere begun 08-28-14, thru day 1 cycle 4 on 10-30-14.  CT AP 11-10-14 due to progressive LE swelling has increased aortocaval and bilateral iliac adenopathy as well as fluid in left pelvis. Clinically much better since radiation, CT CAP 02-02-15 as above. Continue close observation  with options for treatment as above when can tell progression. Appreciate assistance from Drs Sondra Come and Faythe Ghee. I will see her in 6-8 weeks coordinating with PAC flush. Repeat CT if any concerns or at ~ 3 months. 2. LE edema after taxotere 10-20-14, venous dopplers negative and good EF. Resolved with RT improvement in adenopathy 3.SOB and fatigue likely multifactorial with gemzar fatigue, anemia, LE edema. Also better 4.RT diarrhea resolved, now some constipation: prn laxatives 5.morbid obesity, BMI 44. post gastric banding with initial weight loss 50 lbs 6.degenerative arthritis knees: symptoms better with interventions by orthopedics prior to start of chemo. Less symptomatic with improvement in LE swelling. 7.PAC in 8.chronic alopecia  9.no flu vaccine 2015. Would be best to do that this fall if she agrees 10.history skin ca, apparently nonmelanoma 11,iron deficiency anemia related to gyn bleeding initially: hemoglobin back in good range with normal MCV. Follow off iron now  All questions answered and patient knows to call if concerns  prior to next scheduled visit. Time spent 25 min including >50% counseling and coordination of care. CC Drs Sherry Ruffing (and PCP if assigned since Dr Minna Antis no longer at [facility])    Gordy Levan, MD   02/05/2015, 9:04 PM

## 2015-02-05 NOTE — Progress Notes (Signed)
Per Dr. Clabe Seal order provided patient with small plus dilator, medium dilator, and samples of water based lubricant. Educated patient reference use. Patient verbalized understanding.

## 2015-02-05 NOTE — Progress Notes (Signed)
Radiation Oncology         [phone]) [phone] ________________________________  Name: Erin Santiago MRN: [id number]  Date: 02/05/2015  DOB: 07-Jun-1955  Follow-Up Visit Note  CC: Tommy Medal, MD  Gordy Levan, MD  No diagnosis found.  Diagnosis: Endometrial stromal sarcoma, stage III-C (T2b, N1, M0) , progression on chemotherapy and lower extremity swelling   Interval Since Last Radiation:  1  months   Radiation Treatments: The patient's radiation treatment dates extended from  12/01/2014 to 01/05/2015. The site and dose included Pelvis at 45 gray in 25 fractions. The beams and energy used include intensity modulated radiation therapy, helical at 6 megavoltage photons.  Narrative:  The patient returns today for routine follow-up appointment with radiation oncology. She denies symptoms of bleeding, nausea, fatigue, bladder issues or discharge. She reports constipation every 3-4 days and is taking psyllim as needed for this. This Monday night and Tuesday morning, she reported extreme stomach, gas, and "poopey" pain. The pain was strong enough to wake her from a state of sleep. "The contrast cleaned me out and I felt much better.," she also stated. All charts checked and scans reviewed. The results of her most recent CT scan were discussed. She was excited about applying for retirement and has previously filed the paperwork.  ALLERGIES:  has No Known Allergies.  Meds: Current Outpatient Prescriptions  Medication Sig Dispense Refill  . acyclovir (ZOVIRAX) 200 MG capsule Take 1 capsule (200 mg total) by mouth 3 (three) times daily. For mouth ulcers during chemo 60 capsule 1  . Alum & Mag Hydroxide-Simeth (MAGIC MOUTHWASH W/LIDOCAINE) SOLN QID prn for mouth/throat irriation- may swish swallow and or spit 240 mL 0  . antiseptic oral rinse (BIOTENE) LIQD 15 mLs by Mouth Rinse route 4 (four) times daily. 237 mL 0  . dexamethasone (DECADRON) 4 MG tablet Take 2 tabs with food twice a day x 3 days  beginning day prior to Taxotere(Second week of Chemo) 12 tablet 2  . hydrocortisone 2.5 % cream Apply 1 application topically as needed (itch/psoriasis.).     Marland Kitchen lidocaine-prilocaine (EMLA) cream Apply to Porta-cath 1-2 hrs prior to access. Cover with [employer]. 30 g 1  . naproxen sodium (ANAPROX) 220 MG tablet Take 220 mg by mouth 2 (two) times daily with a meal.    . Resveratrol 250 MG CAPS Take 250 mg by mouth 2 (two) times daily.    Marland Kitchen docusate sodium (COLACE) 100 MG capsule Take 100 mg by mouth 2 (two) times daily.     . ferrous fumarate (HEMOCYTE) 325 (106 FE) MG TABS tablet Take 1 tab daily on an empty stomach with OJ or Vitamin C 500 mg (Patient not taking: Reported on 11/27/2014) 30 each 3  . fluconazole (DIFLUCAN) 100 MG tablet Take along with steroids aroud Taxotere (Patient not taking: Reported on 11/19/2014) 12 tablet 1  . furosemide (LASIX) 20 MG tablet Take 1/2 tablet today for LE swelling, can take 1/2 tablet this weekend if swelling is not better. (Patient not taking: Reported on 11/18/2014) 3 tablet 0  . hyaluronate sodium (RADIAPLEXRX) GEL Apply 1 application topically 2 (two) times daily.    Marland Kitchen loperamide (IMODIUM) 2 MG capsule Take 2-4 mg by mouth 4 (four) times daily as needed for diarrhea or loose stools.    Marland Kitchen LORazepam (ATIVAN) 1 MG tablet Place 1/2-1 tablet under the tongue or swallow every 6 hrs as needed for nausea. Will make you drowsy. (Patient not taking: Reported on 11/27/2014) 20 tablet 0  .  meloxicam (MOBIC) 15 MG tablet Take 15 mg by mouth daily.   3  . ondansetron (ZOFRAN ODT) 8 MG disintegrating tablet Take 1 tablet (8 mg total) by mouth every 8 (eight) hours as needed for nausea or vomiting. (Patient not taking: Reported on 09/15/2014) 30 tablet 1  . Psyllium 28.3 % POWD Take 1 packet by mouth daily as needed.     . sodium chloride (OCEAN) 0.65 % SOLN nasal spray Place 1 spray into both nostrils as needed for congestion. (Patient not taking: Reported on 12/09/2014) 30 mL 0    . traMADol (ULTRAM) 50 MG tablet May take 25-50 mg PO BID PRN. (1/2 to 1 whole tab) (Patient not taking: Reported on 11/18/2014) 20 tablet 0   No current facility-administered medications for this encounter.    Physical Findings: The patient is in no acute distress. Patient is alert and oriented x3.  height is 5' (1.524 m) and weight is 222 lb 3.2 oz (100.789 kg). Her oral temperature is 98 F (36.7 C). Her blood pressure is 135/65 and her pulse is 80. Her respiration is 20.  The patient displayed no significant changes in the status of overall health to be noted. Lungs are clear. Heart has regular rate and rhythm. No palpable cervical, supraclavicular, or axillary adenopathy.   Lab Findings: Lab Results  Component Value Date   WBC 4.9 02/03/2015   HGB 12.7 02/03/2015   HCT 38.4 02/03/2015   MCV 81.0 02/03/2015   PLT 236 02/03/2015    Radiographic Findings: Ct Chest W Contrast  02/03/2015   CLINICAL DATA:  Restaging endometrial stromal sarcoma diagnosed October 2016. Patient status post hysterectomy and oophorectomy. Chemotherapy completed June 2016. Radiation therapy followed chemotherapy.  EXAM: CT CHEST, ABDOMEN, AND PELVIS WITH CONTRAST  TECHNIQUE: Multidetector CT imaging of the chest, abdomen and pelvis was performed following the standard protocol during bolus administration of intravenous contrast.  CONTRAST:  152mL OMNIPAQUE IOHEXOL 300 MG/ML  SOLN  COMPARISON:  CT 11/10/2014, PET-CT 08/01/2014  FINDINGS: CT CHEST FINDINGS  Mediastinum/Nodes: No axillary adenopathy. Nodule within the RIGHT lobe of thyroid gland measures 18 mm RIGHT not changed from comparison PET-CT scan. Lesion was not hypermetabolic at that time. No mediastinal lymphadenopathy. No pericardial fluid. No central pulmonary embolism. Esophagus normal  Lungs/Pleura: Small 3 mm nodule anterior to the LEFT oblique fissure on image 21, series 5 is unchanged from prior. Calcified nodule the RIGHT upper lobe on image 15.No  new pulmonary nodules.  CT ABDOMEN AND PELVIS FINDINGS  Hepatobiliary: No focal hepatic lesion. No biliary duct dilatation. Gallbladder is normal. Common bile duct is normal.  Pancreas: Pancreas is normal. No ductal dilatation. No pancreatic inflammation.  Spleen: Normal spleen  Adrenals/urinary tract: Adrenal glands and kidneys are normal. The ureters and bladder normal.  Stomach/Bowel: Post gastric bypass surgery. Small bowel and appendix are normal. Colon and rectosigmoid colon are normal.  Vascular/Lymphatic:  Abdominal aorta is normal caliber.  Retroperitoneal lymph node position between the aorta and IVC measures 26 x 24 mm compared to 28 x 27 mm. LEFT common iliac lymph node measures 18 mm short axis compared to 18 mm. Nodule adjacent to the LEFT oophorectomy site measures 13 mm on image 98, series 2 decreased from 25 mm.  There is again demonstrated a bilobed cystic lesion along the LEFT iliac vessels measuring 9.6 x 6.5 cm compared to 10.2 x 7.2 cm.  No new adenopathy in the abdomen or pelvis. No peritoneal disease or omental studding.  Reproductive: Post  hysterectomy and oophorectomy.  Musculoskeletal: No aggressive osseous lesion.  Other: midline ventral incision without evidence of abscess or infection.  IMPRESSION: Chest Impression:  1. No evidence of thoracic metastasis. 2. Stable small pulmonary nodules appear benign.  Abdomen / Pelvis Impression:  1. 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 2. Mild decrease in volume of cystic lesion along the LEFT iliac vessels. 3. No evidence disease progression the abdomen or pelvis. 4. Postsurgical change consistent with hysterectomy and oophorectomy.   Electronically Signed   By: Suzy Bouchard M.D.   On: 02/03/2015 16:02   Ct Abdomen Pelvis W Contrast  02/03/2015   CLINICAL DATA:  Restaging endometrial stromal sarcoma diagnosed October 2016. Patient status post hysterectomy and oophorectomy. Chemotherapy  completed June 2016. Radiation therapy followed chemotherapy.  EXAM: CT CHEST, ABDOMEN, AND PELVIS WITH CONTRAST  TECHNIQUE: Multidetector CT imaging of the chest, abdomen and pelvis was performed following the standard protocol during bolus administration of intravenous contrast.  CONTRAST:  16mL OMNIPAQUE IOHEXOL 300 MG/ML  SOLN  COMPARISON:  CT 11/10/2014, PET-CT 08/01/2014  FINDINGS: CT CHEST FINDINGS  Mediastinum/Nodes: No axillary adenopathy. Nodule within the RIGHT lobe of thyroid gland measures 18 mm RIGHT not changed from comparison PET-CT scan. Lesion was not hypermetabolic at that time. No mediastinal lymphadenopathy. No pericardial fluid. No central pulmonary embolism. Esophagus normal  Lungs/Pleura: Small 3 mm nodule anterior to the LEFT oblique fissure on image 21, series 5 is unchanged from prior. Calcified nodule the RIGHT upper lobe on image 15.No new pulmonary nodules.  CT ABDOMEN AND PELVIS FINDINGS  Hepatobiliary: No focal hepatic lesion. No biliary duct dilatation. Gallbladder is normal. Common bile duct is normal.  Pancreas: Pancreas is normal. No ductal dilatation. No pancreatic inflammation.  Spleen: Normal spleen  Adrenals/urinary tract: Adrenal glands and kidneys are normal. The ureters and bladder normal.  Stomach/Bowel: Post gastric bypass surgery. Small bowel and appendix are normal. Colon and rectosigmoid colon are normal.  Vascular/Lymphatic:  Abdominal aorta is normal caliber.  Retroperitoneal lymph node position between the aorta and IVC measures 26 x 24 mm compared to 28 x 27 mm. LEFT common iliac lymph node measures 18 mm short axis compared to 18 mm. Nodule adjacent to the LEFT oophorectomy site measures 13 mm on image 98, series 2 decreased from 25 mm.  There is again demonstrated a bilobed cystic lesion along the LEFT iliac vessels measuring 9.6 x 6.5 cm compared to 10.2 x 7.2 cm.  No new adenopathy in the abdomen or pelvis. No peritoneal disease or omental studding.   Reproductive: Post hysterectomy and oophorectomy.  Musculoskeletal: No aggressive osseous lesion.  Other: midline ventral incision without evidence of abscess or infection.  IMPRESSION: Chest Impression:  1. No evidence of thoracic metastasis. 2. Stable small pulmonary nodules appear benign.  Abdomen / Pelvis Impression:  1. 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 2. Mild decrease in volume of cystic lesion along the LEFT iliac vessels. 3. No evidence disease progression the abdomen or pelvis. 4. Postsurgical change consistent with hysterectomy and oophorectomy.   Electronically Signed   By: Suzy Bouchard M.D.   On: 02/03/2015 16:02    Impression:  Erin Santiago is a 59 year old female presenting to clinic in regards to her endometrial stromal sarcoma, stage III-C (T2b, N1, M0) , progression on chemotherapy and lower extremity swelling. The patient is recovering from the effects of radiation and  her most recent surgery. She reports not following up with her surgeon in [place], [place]. She is successfully partaking in healthy and recommended methods of management in regards to the recovery of his disease. There was no evidence for recurrence of disease observed via clinical exam. The patient understood and was pleased with the results of her most recent CT scan. She was provided with a vaginal dilator and proper instructions of use.  Plan:  She was recommended a variety of options to address her bowl movement issues of occasional constapation, including stool softeners. She was more comfortable pursing "natural" stool softeners. She has also been encouraged to stay well hydrated in light of these symptoms. Education and instructions on the proper use and benefits of using the provided vaginal dilator was reviewed. The patient is aware of her follow up appointment with Dr. Marko Plume at 1:00pm today (02/05/2015). The patient is advised of her routine  follow-up appointment with radiation oncology to take place in three months. All vocalized questions and concerns have been addressed. If the patient develops and further questions or concerns in regards to her treatment or recovery, she has been encouraged to contact Dr. Sondra Come, MD, PhD.   This document serves as a record of services personally performed by [name], MD. It was created on his behalf by Lenn Cal, a trained medical scribe. The creation of this record is based on the scribe's personal observations and the provider's statements to them. This document has been checked and approved by the attending provider.    ____________________________________   Blair Promise, PhD, MD

## 2015-02-05 NOTE — Telephone Encounter (Signed)
Gave avs & calendar for October. °

## 2015-02-07 ENCOUNTER — Other Ambulatory Visit: Payer: Self-pay | Admitting: Oncology

## 2015-02-07 DIAGNOSIS — C7989 Secondary malignant neoplasm of other specified sites: Secondary | ICD-10-CM | POA: Insufficient documentation

## 2015-02-07 DIAGNOSIS — Z95828 Presence of other vascular implants and grafts: Secondary | ICD-10-CM | POA: Insufficient documentation

## 2015-03-19 ENCOUNTER — Encounter: Payer: Self-pay | Admitting: Oncology

## 2015-03-27 ENCOUNTER — Encounter: Payer: Self-pay | Admitting: Oncology

## 2015-03-30 ENCOUNTER — Telehealth: Payer: Self-pay | Admitting: *Deleted

## 2015-03-30 NOTE — Telephone Encounter (Signed)
Message     My plan was to get CT at 3 months, which will be late Nov, or sooner if any concerns.    Is she having any problems now specifically?    If not, we will look her over and discuss timing of scan when I see her on 10-27        No particular concerns at this time. Will see Dr Marko Plume on 10/27

## 2015-04-01 ENCOUNTER — Other Ambulatory Visit: Payer: Self-pay | Admitting: Oncology

## 2015-04-01 ENCOUNTER — Other Ambulatory Visit: Payer: Self-pay | Admitting: *Deleted

## 2015-04-01 DIAGNOSIS — C541 Malignant neoplasm of endometrium: Secondary | ICD-10-CM

## 2015-04-01 MED ORDER — SODIUM CHLORIDE 0.9 % IJ SOLN
10.0000 mL | Freq: Once | INTRAMUSCULAR | Status: DC
  Filled 2015-04-01: qty 10

## 2015-04-01 MED ORDER — HEPARIN SOD (PORK) LOCK FLUSH 100 UNIT/ML IV SOLN
500.0000 [IU] | Freq: Once | INTRAVENOUS | Status: DC
  Filled 2015-04-01: qty 5

## 2015-04-02 ENCOUNTER — Ambulatory Visit (HOSPITAL_BASED_OUTPATIENT_CLINIC_OR_DEPARTMENT_OTHER): Payer: Federal, State, Local not specified - PPO

## 2015-04-02 ENCOUNTER — Telehealth: Payer: Self-pay | Admitting: Oncology

## 2015-04-02 ENCOUNTER — Encounter: Payer: Self-pay | Admitting: Oncology

## 2015-04-02 ENCOUNTER — Other Ambulatory Visit: Payer: Self-pay | Admitting: *Deleted

## 2015-04-02 ENCOUNTER — Ambulatory Visit (HOSPITAL_BASED_OUTPATIENT_CLINIC_OR_DEPARTMENT_OTHER): Payer: Federal, State, Local not specified - PPO | Admitting: Oncology

## 2015-04-02 VITALS — BP 111/71 | HR 71 | Temp 98.0°F | Resp 18 | Ht 60.0 in | Wt 224.0 lb

## 2015-04-02 DIAGNOSIS — C541 Malignant neoplasm of endometrium: Secondary | ICD-10-CM

## 2015-04-02 DIAGNOSIS — C772 Secondary and unspecified malignant neoplasm of intra-abdominal lymph nodes: Secondary | ICD-10-CM

## 2015-04-02 DIAGNOSIS — Z6841 Body Mass Index (BMI) 40.0 and over, adult: Secondary | ICD-10-CM

## 2015-04-02 DIAGNOSIS — Z23 Encounter for immunization: Secondary | ICD-10-CM | POA: Diagnosis not present

## 2015-04-02 DIAGNOSIS — Z452 Encounter for adjustment and management of vascular access device: Secondary | ICD-10-CM | POA: Diagnosis not present

## 2015-04-02 DIAGNOSIS — C775 Secondary and unspecified malignant neoplasm of intrapelvic lymph nodes: Secondary | ICD-10-CM | POA: Diagnosis not present

## 2015-04-02 DIAGNOSIS — Z95828 Presence of other vascular implants and grafts: Secondary | ICD-10-CM

## 2015-04-02 LAB — CBC WITH DIFFERENTIAL/PLATELET
BASO%: 0.4 % (ref 0.0–2.0)
Basophils Absolute: 0 10e3/uL (ref 0.0–0.1)
EOS%: 5.2 % (ref 0.0–7.0)
Eosinophils Absolute: 0.2 10e3/uL (ref 0.0–0.5)
HCT: 37 % (ref 34.8–46.6)
HGB: 12.1 g/dL (ref 11.6–15.9)
LYMPH%: 12.1 % — ABNORMAL LOW (ref 14.0–49.7)
MCH: 27.3 pg (ref 25.1–34.0)
MCHC: 32.7 g/dL (ref 31.5–36.0)
MCV: 83.5 fL (ref 79.5–101.0)
MONO#: 0.4 10e3/uL (ref 0.1–0.9)
MONO%: 9.1 % (ref 0.0–14.0)
NEUT#: 3.4 10e3/uL (ref 1.5–6.5)
NEUT%: 73.2 % (ref 38.4–76.8)
Platelets: 235 10e3/uL (ref 145–400)
RBC: 4.43 10e6/uL (ref 3.70–5.45)
RDW: 15.8 % — ABNORMAL HIGH (ref 11.2–14.5)
WBC: 4.6 10e3/uL (ref 3.9–10.3)
lymph#: 0.6 10e3/uL — ABNORMAL LOW (ref 0.9–3.3)

## 2015-04-02 LAB — COMPREHENSIVE METABOLIC PANEL (CC13)
ALT: 13 U/L (ref 0–55)
AST: 16 U/L (ref 5–34)
Albumin: 3.7 g/dL (ref 3.5–5.0)
Alkaline Phosphatase: 72 U/L (ref 40–150)
Anion Gap: 6 mEq/L (ref 3–11)
BILIRUBIN TOTAL: 0.92 mg/dL (ref 0.20–1.20)
BUN: 25.3 mg/dL (ref 7.0–26.0)
CO2: 25 meq/L (ref 22–29)
Calcium: 9.2 mg/dL (ref 8.4–10.4)
Chloride: 110 mEq/L — ABNORMAL HIGH (ref 98–109)
Creatinine: 0.8 mg/dL (ref 0.6–1.1)
EGFR: 85 mL/min/{1.73_m2} — ABNORMAL LOW (ref >90)
GLUCOSE: 87 mg/dL (ref 70–140)
POTASSIUM: 3.9 meq/L (ref 3.5–5.1)
SODIUM: 141 meq/L (ref 136–145)
Total Protein: 6.9 g/dL (ref 6.4–8.3)

## 2015-04-02 MED ORDER — SODIUM CHLORIDE 0.9 % IJ SOLN
10.0000 mL | Freq: Once | INTRAMUSCULAR | Status: DC
  Administered 2015-04-02: 10 mL
  Filled 2015-04-02: qty 10

## 2015-04-02 MED ORDER — HEPARIN SOD (PORK) LOCK FLUSH 100 UNIT/ML IV SOLN
500.0000 [IU] | Freq: Once | INTRAVENOUS | Status: DC
  Administered 2015-04-02: 500 [IU]
  Filled 2015-04-02: qty 5

## 2015-04-02 MED ORDER — PNEUMOCOCCAL 13-VAL CONJ VACC IM SUSP
0.5000 mL | Freq: Once | INTRAMUSCULAR | Status: AC
  Administered 2015-04-02: 0.5 mL via INTRAMUSCULAR
  Filled 2015-04-02: qty 0.5

## 2015-04-02 NOTE — Patient Instructions (Signed)
Radiology should use your portacath for the CT  (Let Dr Mariana Kaufman RN know if they do NOT use portacath for CT, as we will need to move flush sooner if not)

## 2015-04-02 NOTE — Progress Notes (Signed)
OFFICE PROGRESS NOTE   April 02, 2015   Physicians:Emma Rossi/ [name],; Merrilee Seashore (PCP [facility]), [name]; [name], [name], [name], [name] [facility])  INTERVAL HISTORY:  Patient is seen, alone for visit, in continuing attention to high grade endometrial stromal sarcoma, which progressed following surgery while on gemzar taxotere, but did improve with radiation given thru 01-05-15. She continues observation, will have 3 month follow up CT in late Nov as long as clinically stable until then. Plan is to wait until clear progressive disease before additional treatment, likely with adriamycin vs on study. She is on waiting list [facility] program. She will have PE by new PCP Dr Ashby Dawes on 05-25-15. Last imaging CT CAP 02-03-15.  Patient has felt progressively better since radiation was completed, with no LE swelling, better energy and better activity tolerance, tho she is fatigued after working 4-5 hours daily now back part time with her previous [employer] job. She denies abdominal or pelvic pain and has no SOB or cough.  Meloxicam is very helpful for degenerative arthritis in knees. Bowels are moving well with extra fiber. No bleeding. No problems with PAC  PAC in Flu vaccine 03-11-15. Pneumococcal -13 done 04-02-15; should have -23 in 8 weeks per [facility] pharmacist.  ONCOLOGIC HISTORY Patient had been menopausal since age 77 until she had what seemed to her to be a menstrual period in Sept 2015, with large blood clot at completion of bleeding stopped then. She continued with lesser bleeding over next several months until she was seen in 06-2014 by [name]. Hemoglobin was 12.7 on 07-04-14. CT CAP 06-25-14 had uterus 16.4 x 13.5 x 14.4 cm with marked expansion of endometrial canal by heterogeneous mass, bilateral pelvic sidewall adenopathy, iliac adenopathy with no definite retroperitoneal or mesenteric  adenopathy, no ascites, no liver mets. Attempted endometrial sampling 06-26-14 and 07-03-14 was nondiagnostic; around that time the patient was passing clots and using one large maxipad hourly. She was seen by [name] 07-05-14, with uterine fundus palpable above umbilicus. She had surgery by [name] at [facility] on 07-14-14, which was exploratory laparotomy with TAH BSO, bilateral total pelvic lymphadenectomy and sampling of aortic and renal nodes. Pathology [facility] [phone]) found high grade endometrial stromal sarcoma with primary 18 cm, 8/45 nodes involved including 2 right pelvic and 6 left pelvic nodes, ER PR negative. Case was presented at [facility] multidisciplinary conference 07-23-14, with recommendation for PET CT to evaluate inguinal and portahepatis nodes, and to consider adjuvant gemzar taxotere and possibly follow with 4 cycles of adriamycin, then to consider whole pelvic RT. She saw [name] for post op follow up on 07-28-14, with recommendation for gemzar taxotere and adriamycin, whole pelvic RT after chemo and consideration of Megace maintenance after chemo (possibly prior to negative ER PR information). PET 08-01-14 had some uptake in aortocaval and left paraaortic regions. She had day 1 cycle 1 gemzar taxotere on 08-28-14, day 8 cycle 1 on 09-04-14 and neulasta on 09-06-14. Admitted with neutropenic fever on day 14 cycle 1, with oral mucositis and some diarrhea. Counts maintained cycle 2 using OnPro neulasta day 9. She had progressive LE swelling after cycle 3, with venous dopplers negative 10-23-14, then LE swelling and SOB so marked after day 1 cycle 4 that chemo was held. Restaging CT AP + CXR 11-10-14 had stable tiny left pulmonary nodule/ no pleural effusion and normal heart size; CT had necrotic aortocaval adenopathy, iliac adenopathy L>R  and 7x10 cm fluid collection in pelvis. She received IMRT 45 gray in 25 fractions to pelvis from 6-27 thru 01-05-15, with resolution of LE swelling by completion of course.  CT CAP showed improvement in retroperitoneal and pelvic involvement, tho still measurable areas.     Review of systems as above, also: No fever or symptoms of infection. Good appetite. Is able to sleep.Bladder ok. Remainder of 10 point Review of Systems negative.  Objective:  Vital signs in last 24 hours:  BP 111/71 mmHg  Pulse 71  Temp(Src) 98 F (36.7 C) (Oral)  Resp 18  Ht 5' (1.524 m)  Wt 224 lb (101.606 kg)  BMI 43.75 kg/m2  SpO2 100%  LMP 02/04/2014 Weight down 1.5 lbs Alert, oriented and appropriate, looks entirely comfortable, very pleasant as always. Ambulatory without assistance. Hair has grown back  HEENT:PERRL, sclerae not icteric. Oral mucosa moist without lesions, posterior pharynx clear.  Neck supple. No JVD.  Lymphatics:no cervical,spraclavicular, axillary or inguinal adenopathy Resp: clear to auscultation bilaterally and normal percussion bilaterally Cardio: regular rate and rhythm. No gallop. GI: abdomen obese, soft, nontender, not obviously distended, cannot appreciate mass or organomegaly. Normally active bowel sounds. Surgical incision not remarkable. Musculoskeletal/ Extremities: without pitting edema, cords, tenderness Neuro: no peripheral neuropathy. Otherwise nonfocal. PSYCH appropriate mood and affect Skin without rash, ecchymosis, petechiae  Portacath-without erythema or tenderness, flushed with good blood return today  Lab Results:  Results for orders placed or performed in visit on 04/02/15  CBC with Differential  Result Value Ref Range   WBC 4.6 3.9 - 10.3 10e3/uL   NEUT# 3.4 1.5 - 6.5 10e3/uL   HGB 12.1 11.6 - 15.9 g/dL   HCT 37.0 34.8 - 46.6 %   Platelets 235 145 - 400 10e3/uL   MCV 83.5 79.5 - 101.0 fL   MCH 27.3 25.1 - 34.0 pg   MCHC 32.7 31.5 - 36.0 g/dL   RBC 4.43 3.70 - 5.45 10e6/uL   RDW 15.8 (H) 11.2 - 14.5 %   lymph# 0.6 (L) 0.9 - 3.3 10e3/uL   MONO# 0.4 0.1 - 0.9 10e3/uL   Eosinophils Absolute 0.2 0.0 - 0.5 10e3/uL    Basophils Absolute 0.0 0.0 - 0.1 10e3/uL   NEUT% 73.2 38.4 - 76.8 %   LYMPH% 12.1 (L) 14.0 - 49.7 %   MONO% 9.1 0.0 - 14.0 %   EOS% 5.2 0.0 - 7.0 %   BASO% 0.4 0.0 - 2.0 %  Comprehensive metabolic panel (Cmet) - [facility]  Result Value Ref Range   Sodium 141 136 - 145 mEq/L   Potassium 3.9 3.5 - 5.1 mEq/L   Chloride 110 (H) 98 - 109 mEq/L   CO2 25 22 - 29 mEq/L   Glucose 87 70 - 140 mg/dl   BUN 25.3 7.0 - 26.0 mg/dL   Creatinine 0.8 0.6 - 1.1 mg/dL   Total Bilirubin 0.92 0.20 - 1.20 mg/dL   Alkaline Phosphatase 72 40 - 150 U/L   AST 16 5 - 34 U/L   ALT 13 0 - 55 U/L   Total Protein 6.9 6.4 - 8.3 g/dL   Albumin 3.7 3.5 - 5.0 g/dL   Calcium 9.2 8.4 - 10.4 mg/dL   Anion Gap 6 3 - 11 mEq/L   EGFR 85 (L) >90 ml/min/1.73 m2     Studies/Results:  No results found.  Medications: I have reviewed the patient's current medications. Pneumonia vaccine (-13) given today; per discussion with [facility] pharmacist and my review of guidelines, should  have (-23) in ~ 8 weeks.  DISCUSSION: all of interval history reviewed. Patient understands plan for CT 3 months from last imaging as long as clinically stable until then, as she is today.  Discussed pneumovax, will be given today due to previous pneumonia and as she is anticipated to resume chemotherapy in future, with associated immunocompromise.   Assessment/Plan:  1.high grade endometrial stromal sarcoma with bilateral pelvic node involvement at TAH BSO, pelvic lymphadenectomy and node sampling at [facility] 07-14-14. Cycle 1 gemzar taxotere begun 08-28-14, thru day 1 cycle 4 on 10-30-14, with CT AP 11-10-14 then showing aortocaval and bilateral iliac adenopathy as well as fluid in left pelvis. Radiation helpful, clinically doing well.   Plan CT 3 months from last scan (02-03-15). Options for treatment as noted, known to Dr Faythe Ghee. 2. LE edema after taxotere 10-20-14, venous dopplers negative and good EF. Resolved with RT improvement in adenopathy 3.PAC  in 4.morbid obesity, BMI 44. post gastric banding with initial weight loss 50 lbs 5.degenerative arthritis knees: symptoms better with meloxicam. 6.flu vaccine 03-11-15. Pneumonia vaccine -13 today; needs -23 in 8 weeks. 7.chronic alopecia, tho hair has grown back well now  8.history nonmelanoma skin ca   All questions answered and patient is in agreement with recommendations and plans. Cc Dr Ashby Dawes, Dr Budd PalmerJeannine Kitten. TIme spent 25 min including >50% counseling and coordination of care.   Gordy Levan, MD   04/02/2015, 4:26 PM

## 2015-04-02 NOTE — Telephone Encounter (Signed)
Appointments made and avs printed for patient,contrast given °

## 2015-05-01 ENCOUNTER — Encounter: Payer: Self-pay | Admitting: Oncology

## 2015-05-05 ENCOUNTER — Telehealth: Payer: Self-pay | Admitting: *Deleted

## 2015-05-05 NOTE — Telephone Encounter (Signed)
Received phone call from patient inquiring if CT scan ordered by Dr. Marko Plume has been scheduled yet. I told patient it has not and that I would get it scheduled and call her back.  Scan scheduled for Thursday, 05/07/15, with 1:45pm arrival time. Called patient back and she is agreeable to time. Pt states she already has the oral contrast at home. Reviewed instructions including NPO 4 hours prior to scan and that she needs to drink one bottle of contrast at 12pm and the other at 1pm the day of the scan. Patient wrote down instructions and verbalized understanding.  Spoke with Vaughan Basta in managed care and patient's insurance does not require prior-auth for scan.

## 2015-05-07 ENCOUNTER — Ambulatory Visit (HOSPITAL_COMMUNITY)
Admission: RE | Admit: 2015-05-07 | Discharge: 2015-05-07 | Disposition: A | Discharge status: Home / Self Care | Payer: Federal, State, Local not specified - PPO | Source: Ambulatory Visit | Attending: Oncology | Admitting: Oncology

## 2015-05-07 DIAGNOSIS — R918 Other nonspecific abnormal finding of lung field: Secondary | ICD-10-CM | POA: Diagnosis not present

## 2015-05-07 DIAGNOSIS — Z90722 Acquired absence of ovaries, bilateral: Secondary | ICD-10-CM | POA: Diagnosis not present

## 2015-05-07 DIAGNOSIS — C7951 Secondary malignant neoplasm of bone: Secondary | ICD-10-CM | POA: Diagnosis not present

## 2015-05-07 DIAGNOSIS — K5730 Diverticulosis of large intestine without perforation or abscess without bleeding: Secondary | ICD-10-CM | POA: Insufficient documentation

## 2015-05-07 DIAGNOSIS — Z90710 Acquired absence of both cervix and uterus: Secondary | ICD-10-CM | POA: Diagnosis not present

## 2015-05-07 DIAGNOSIS — Z9221 Personal history of antineoplastic chemotherapy: Secondary | ICD-10-CM | POA: Insufficient documentation

## 2015-05-07 DIAGNOSIS — R188 Other ascites: Secondary | ICD-10-CM | POA: Diagnosis not present

## 2015-05-07 DIAGNOSIS — C541 Malignant neoplasm of endometrium: Secondary | ICD-10-CM | POA: Diagnosis not present

## 2015-05-07 DIAGNOSIS — R590 Localized enlarged lymph nodes: Secondary | ICD-10-CM | POA: Diagnosis not present

## 2015-05-07 DIAGNOSIS — E041 Nontoxic single thyroid nodule: Secondary | ICD-10-CM | POA: Insufficient documentation

## 2015-05-07 DIAGNOSIS — Z923 Personal history of irradiation: Secondary | ICD-10-CM | POA: Insufficient documentation

## 2015-05-07 MED ORDER — IOHEXOL 300 MG/ML  SOLN
100.0000 mL | Freq: Once | INTRAMUSCULAR | Status: AC | PRN
  Administered 2015-05-07: 100 mL via INTRAVENOUS

## 2015-05-08 ENCOUNTER — Telehealth: Payer: Self-pay | Admitting: Oncology

## 2015-05-08 ENCOUNTER — Other Ambulatory Visit: Payer: Self-pay | Admitting: Oncology

## 2015-05-08 NOTE — Telephone Encounter (Signed)
Medical Oncology  CT CAP done 05-07-15 shows progressive disease, including at T4.  Spoke with patient by phone: no pain that seems related to the T4 findings, is taking meloxicam daily as she has been.  Told patient that CT shows progression of the malignancy. She is glad to see this MD to discuss on 12-5 @ 2:00, and agrees to see Dr Sondra Come as this MD has set up with radiation oncology for 05-13-15 arrive 2:15 for 2:30 appointment.  POF sent urgently to schedulers.  No lab with MD on 12-5.  Godfrey Pick, MD

## 2015-05-08 NOTE — Telephone Encounter (Signed)
Per pof patient is aware of her new appointment 12/5

## 2015-05-10 ENCOUNTER — Other Ambulatory Visit: Payer: Self-pay | Admitting: Oncology

## 2015-05-11 ENCOUNTER — Encounter: Payer: Self-pay | Admitting: Oncology

## 2015-05-11 ENCOUNTER — Ambulatory Visit (HOSPITAL_BASED_OUTPATIENT_CLINIC_OR_DEPARTMENT_OTHER): Payer: Federal, State, Local not specified - PPO | Admitting: Oncology

## 2015-05-11 VITALS — BP 130/79 | HR 77 | Temp 98.5°F | Resp 18 | Ht 60.0 in | Wt 220.6 lb

## 2015-05-11 DIAGNOSIS — C541 Malignant neoplasm of endometrium: Secondary | ICD-10-CM | POA: Diagnosis not present

## 2015-05-11 DIAGNOSIS — C775 Secondary and unspecified malignant neoplasm of intrapelvic lymph nodes: Secondary | ICD-10-CM | POA: Diagnosis not present

## 2015-05-11 DIAGNOSIS — Z85828 Personal history of other malignant neoplasm of skin: Secondary | ICD-10-CM | POA: Diagnosis not present

## 2015-05-11 DIAGNOSIS — Z6841 Body Mass Index (BMI) 40.0 and over, adult: Secondary | ICD-10-CM

## 2015-05-11 DIAGNOSIS — C7951 Secondary malignant neoplasm of bone: Secondary | ICD-10-CM

## 2015-05-11 DIAGNOSIS — Z95828 Presence of other vascular implants and grafts: Secondary | ICD-10-CM

## 2015-05-11 DIAGNOSIS — C772 Secondary and unspecified malignant neoplasm of intra-abdominal lymph nodes: Secondary | ICD-10-CM

## 2015-05-11 NOTE — Progress Notes (Signed)
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 lIn addition, there is a probable mesenteric implant in the right lower quadrant of the abdomen."   Past/Anticipated chemotherapy by medical oncology, if any: Cycle 1 gemzar taxotere begun 08-28-14, thru day 1 cycle 4 on 10-30-14, with CT AP 11-10-14    Pain on a scale of 0-10 is: 7/10 between her shoulder blades   If Spine Met(s), symptoms, if any, include:  Bowel/Bladder retention or incontinence (please describe): no  Numbness or weakness in extremities (please describe): tingling occasionally in her arms that started a week ago  Current Decadron regimen, if applicable: no  Ambulatory status? Walker? Wheelchair?: ambulatory  SAFETY ISSUES:  Prior radiation? 12/01/14-01/05/15 - pelvis 45 gray   Pacemaker/ICD? no  Possible current pregnancy? no  Is the patient on methotrexate? no  Current Complaints / other details:  Patient reports she would like to start treatment as soon as possible.  BP 138/64 mmHg  Pulse 79  Temp(Src) 98.3 F (36.8 C) (Oral)  Resp 16  Ht 5' (1.524 m)  Wt 221 lb 9.6 oz (100.517 kg)  BMI 43.28 kg/m2  SpO2 100%  LMP 02/04/2014   Wt Readings from Last 3 Encounters:  05/13/15 221 lb 9.6 oz (100.517 kg)  05/11/15 220 lb 9.6 oz (100.064 kg)  04/02/15 224 lb (101.606 kg)

## 2015-05-11 NOTE — Progress Notes (Signed)
OFFICE PROGRESS NOTE   May 11, 2015   Physicians:Emma Rossi/ Andrew Au, , [name], [name] [facility]),Ajith Sutherland (PCP [facility]), [name]; [name], [name]  INTERVAL HISTORY:  Patient is seen, alone for visit and earlier than initially planned, now with evidence of progressive metastatic endometrial stromal sarcoma by CT CAP done 05-07-15 as 3 month follow up. She does not seem symptomatic, however with new involvement at T4 she is to see Dr Sondra Come on 05-13-15. Last treatment was RT to pelvis completed 01-05-15; she previously progressed while on adjuvant gemcitabine taxotere. Plan since last scans 02-03-15 was to follow with close observation until clear evidence of progression, then consider adriamycin vs trial if available. She is already scheduled for follow up with Dr Faythe Ghee on 05-26-15. She has been on waiting list for [facility] trial, which recently resumed enrollment. PAC in, flushed with CT 05-07-15. Echocardiogram 08-2014 with EF 55-60%.  Patient missed appointment for labs with PCP today due to present situation; she has not met new PCP Dr Ashby Dawes  Patient denies any upper back pain when I spoke with her by phone after the CT, however since that conversation has been aware of some mild discomfort in upper back, with no weakness or sensory deficits. Energy and appetite are at baseline, no abdominal or pelvic discomfort, bowels moving regularly, no increased SOB or cough. No swelling LE bilaterally. No bleeding. No problems with PAC. No other new or different pain. No fever or symptoms of infection. Single lesion consistent with psoriasis left forearm, first that she has had since chemo. She has been trying to lose weight, dieting easier as she has disliked sweets since chemo. Remainder of 10 point Review of Systems negative/ unchanged.  PAC in Flu vaccine 03-11-15. Pneumococcal -13 done 04-02-15; for  pneumovax -23 05-28-15 (= 8 weeks)    ONCOLOGIC HISTORY Patient had been menopausal since age 64 until she had what seemed to her to be a menstrual period in Sept 2015, with large blood clot at completion of bleeding stopped then. She continued with lesser bleeding over next several months until she was seen in 06-2014 by [name]. Hemoglobin was 12.7 on 07-04-14. CT CAP 06-25-14 had uterus 16.4 x 13.5 x 14.4 cm with marked expansion of endometrial canal by heterogeneous mass, bilateral pelvic sidewall adenopathy, iliac adenopathy with no definite retroperitoneal or mesenteric adenopathy, no ascites, no liver mets. Attempted endometrial sampling 06-26-14 and 07-03-14 was nondiagnostic; around that time the patient was passing clots and using one large maxipad hourly. She was seen by [name] 07-05-14, with uterine fundus palpable above umbilicus. She had surgery by [name] at [facility] on 07-14-14, which was exploratory laparotomy with TAH BSO, bilateral total pelvic lymphadenectomy and sampling of aortic and renal nodes. Pathology [facility] [phone]) found high grade endometrial stromal sarcoma with primary 18 cm, 8/45 nodes involved including 2 right pelvic and 6 left pelvic nodes, ER PR negative. Case was presented at [facility] multidisciplinary conference 07-23-14, with recommendation for PET CT to evaluate inguinal and portahepatis nodes, and to consider adjuvant gemzar taxotere and possibly follow with 4 cycles of adriamycin, then to consider whole pelvic RT. She saw [name] for post op follow up on 07-28-14, with recommendation for gemzar taxotere and adriamycin, whole pelvic RT after chemo and consideration of Megace maintenance after chemo (possibly prior to negative ER PR information). PET 08-01-14 had some uptake in aortocaval and left paraaortic regions. She had day 1 cycle  1 gemzar taxotere on 08-28-14, day 8 cycle 1 on 09-04-14 and neulasta on 09-06-14. Admitted with neutropenic fever on day 14 cycle 1, with  oral mucositis and some diarrhea. Counts maintained cycle 2 using OnPro neulasta day 9. She had progressive LE swelling after cycle 3, with venous dopplers negative 10-23-14, then LE swelling and SOB so marked after day 1 cycle 4 that chemo was held. Restaging CT AP + CXR 11-10-14 had stable tiny left pulmonary nodule/ no pleural effusion and normal heart size; CT had necrotic aortocaval adenopathy, iliac adenopathy L>R and 7x10 cm fluid collection in pelvis. She received IMRT 45 gray in 25 fractions to pelvis from 6-27 thru 01-05-15, with resolution of LE swelling by completion of course. CT CAP 02-03-15 showed improvement in retroperitoneal and pelvic involvement. She went onto observation after RT, plan to wait until clear progressive disease before resuming treatment, possibly adriamycin vs on study. CT CAP 05-07-15 showed new lytic lesion at T4, stable 2-3 mm pulmonary nodules, increased left pelvic and retroperitoneal nodes and RLQ peritoneal nodule.   Objective:  Vital signs in last 24 hours:  BP 130/79 mmHg  Pulse 77  Temp(Src) 98.5 F (36.9 C) (Oral)  Resp 18  Ht 5' (1.524 m)  Wt 220 lb 9.6 oz (100.064 kg)  BMI 43.08 kg/m2  SpO2 98%  LMP 02/04/2014 Weight down 3.5 lbs, intentional. Alert, oriented and appropriate, looks comfortable. Respirations not labored RA. Ambulatory without assistance, with effort due to morbid obesity and degenerative arthritis knees.   HEENT:PERRL, sclerae not icteric. Oral mucosa moist without lesions, posterior pharynx clear.  Neck supple. No JVD.  Lymphatics:no cervical,supraclavicular or inguinal adenopathy Resp: clear to auscultation bilaterally and normal percussion bilaterally Cardio: regular rate and rhythm. No gallop. GI: abdomen obese, soft, nontender, not distended, cannot appreciate mass or organomegaly. Normal bowel sounds. Surgical incision not remarkable. Musculoskeletal/ Extremities: Minimally tender to palpation mid T spine.  LE without pitting  edema, cords, tenderness Neuro: no peripheral neuropathy. Otherwise nonfocal. PSYCH tearful at one point during discussion, but mood and affect still ok. Skin 1 cm scaly circular area left forearm suggests psoriasis, otherwise without rash, ecchymosis, petechiae Portacath-without erythema or tenderness  Lab Results: Labs not repeated today. No marker Results for orders placed or performed in visit on 04/02/15  CBC with Differential  Result Value Ref Range   WBC 4.6 3.9 - 10.3 10e3/uL   NEUT# 3.4 1.5 - 6.5 10e3/uL   HGB 12.1 11.6 - 15.9 g/dL   HCT 37.0 34.8 - 46.6 %   Platelets 235 145 - 400 10e3/uL   MCV 83.5 79.5 - 101.0 fL   MCH 27.3 25.1 - 34.0 pg   MCHC 32.7 31.5 - 36.0 g/dL   RBC 4.43 3.70 - 5.45 10e6/uL   RDW 15.8 (H) 11.2 - 14.5 %   lymph# 0.6 (L) 0.9 - 3.3 10e3/uL   MONO# 0.4 0.1 - 0.9 10e3/uL   Eosinophils Absolute 0.2 0.0 - 0.5 10e3/uL   Basophils Absolute 0.0 0.0 - 0.1 10e3/uL   NEUT% 73.2 38.4 - 76.8 %   LYMPH% 12.1 (L) 14.0 - 49.7 %   MONO% 9.1 0.0 - 14.0 %   EOS% 5.2 0.0 - 7.0 %   BASO% 0.4 0.0 - 2.0 %  Comprehensive metabolic panel (Cmet) - [facility]  Result Value Ref Range   Sodium 141 136 - 145 mEq/L   Potassium 3.9 3.5 - 5.1 mEq/L   Chloride 110 (H) 98 - 109 mEq/L   CO2 25  22 - 29 mEq/L   Glucose 87 70 - 140 mg/dl   BUN 25.3 7.0 - 26.0 mg/dL   Creatinine 0.8 0.6 - 1.1 mg/dL   Total Bilirubin 0.92 0.20 - 1.20 mg/dL   Alkaline Phosphatase 72 40 - 150 U/L   AST 16 5 - 34 U/L   ALT 13 0 - 55 U/L   Total Protein 6.9 6.4 - 8.3 g/dL   Albumin 3.7 3.5 - 5.0 g/dL   Calcium 9.2 8.4 - 10.4 mg/dL   Anion Gap 6 3 - 11 mEq/L   EGFR 85 (L) >90 ml/min/1.73 m2     Studies/Results: CT CHEST, ABDOMEN, AND PELVIS WITH CONTRAST  TECHNIQUE: Multidetector CT imaging of the chest, abdomen and pelvis was performed following the standard protocol during bolus administration of intravenous contrast.  CONTRAST: 162mL OMNIPAQUE IOHEXOL 300 MG/ML SOLN  COMPARISON: CT  of the chest, abdomen and pelvis 02/03/2015.  FINDINGS: CT CHEST FINDINGS  Mediastinum/Nodes: Heart size is borderline enlarged. There is no significant pericardial fluid, thickening or pericardial calcification. Severe mitral annular calcifications. Right internal jugular single-lumen porta cath with tip terminating at the superior cavoatrial junction. No pathologically enlarged mediastinal or hilar lymph nodes. Esophagus is unremarkable in appearance. No axillary lymphadenopathy. Heterogeneous appearance of the thyroid gland, most notable for a 17 mm nodule in the right lobe of the gland which is partially calcified (unchanged and nonspecific).  Lungs/Pleura: 3 mm right upper lobe pulmonary nodule (image 17 of series 5) is unchanged, as is a 2 mm right middle lobe pulmonary nodule (image 36 of series 5), and a 3 mm left upper lobe nodule (image 22 of series 5). No acute consolidative airspace disease. No pleural effusions. Mild scarring in the medial aspect of the right lower lobe adjacent to several prominent osteophytes is unchanged compared to numerous prior examinations.  Musculoskeletal: New lytic lesion in the right side of the T4 vertebral body (image 16 of series 2) with some soft tissue component which slightly impinges upon the right side of the central spinal canal, compatible with a new metastatic lesion.  CT ABDOMEN PELVIS FINDINGS  Hepatobiliary: No cystic or solid hepatic lesions. No intra or extrahepatic biliary ductal dilatation. Gallbladder is normal in appearance.  Pancreas: No pancreatic mass. No pancreatic ductal dilatation. No pancreatic or peripancreatic fluid or inflammatory changes.  Spleen: Unremarkable.  Adrenals/Urinary Tract: Bilateral adrenal glands and bilateral kidneys are normal in appearance. No hydroureteronephrosis. Urinary bladder is normal in appearance.  Stomach/Bowel: Postoperative changes of prior sleeve gastrectomy  are noted. No pathologic dilatation of small bowel or colon. A few scattered colonic diverticulae are noted, most evident in the proximal sigmoid colon, without surrounding inflammatory changes to suggest an acute diverticulitis at this time. Normal appendix.  Vascular/Lymphatic: Mild atherosclerosis in the abdominal and pelvic vasculature, without evidence of aneurysm or dissection. Significant worsening lymphadenopathy along the left pelvic sidewall and in the retroperitoneum. Specific examples include a 31 mm short axis aortocaval lymph node (image 73 of series 2, which previously measured 25 mm in short axis, 21 mm short axis left common iliac lymph node (image 81 of series 2, which previously measured 18 mm in short axis, new left common iliac lymph node measuring 14 mm in short axis (previously nonenlarged) on image 84 of series 2, and left internal iliac lymph node which measures 23 mm in short axis (image 99 of series 2), previously only 13 mm. Borderline enlarged hepatoduodenal ligament lymph node measuring 13 mm in short axis is  also incidentally noted, but unchanged compared to the prior study.  Reproductive: Status post total abdominal hysterectomy and bilateral salpingo-oophorectomy.  Other: New soft tissue lesion in the right lower quadrant of the abdomen (image 92 of series 2), concerning for peritoneal implant, measuring 2.3 x 1.5 cm. Previously noted cystic lesion along the left pelvic sidewall has significantly decreased in size and become more sessile in appearance, currently measuring only 15 x 44 mm (image 93 of series 2), likely a resolving postoperative seroma. Small volume of ascites in the low anatomic pelvis. No pneumoperitoneum.  Musculoskeletal: There are no aggressive appearing lytic or blastic lesions noted in the visualized portions of the skeleton.  IMPRESSION: 1. Today's study demonstrates progression of metastatic disease, with worsening  left pelvic and retroperitoneal lymphadenopathy, and a new bone lesion in the right side of the T4 vertebral body, with soft tissue extension slightly impinging upon the central spinal canal. In addition, there is a probable mesenteric implant in the right lower quadrant of the abdomen (image 92 of series 2). 2. Decreased size of cystic lesion along left pelvic sidewall, which may reflect a positive response to therapy, or could simply represent a resolving postoperative seroma. 3. Trace volume of ascites. 4. Additional incidental findings, as above.  PACs images reviewed with patient now.   Medications: I have reviewed the patient's current medications. She does not need pain medication now.  DISCUSSION CT information discussed as above. She understands that most immediate concern is the involvement at T4, which fortunately is essentially asymptomatic; she will keep appointment with Dr Sondra Come as scheduled on 05-13-15. I will let Dr Faythe Ghee know situation, as patient will need to begin systemic treatment after radiation completes. I have clearly explained that goal of treatment is to keep disease controlled with as few symptoms as possible for as long as possible, but that we do not expect to cure this progressive metastatic disease.  I will adjust my next appointment as appropriate when we know radiation schedule. Echocardiogram from 08-2014 is ok to begin adriamycin if that is the choice.   Assessment/Plan: 1.high grade endometrial stromal sarcoma with bilateral pelvic node involvement at TAH BSO, pelvic lymphadenectomy and node sampling at [facility] 07-14-14. Gemzar taxotere begun 08-28-14, thru day 1 cycle 4 on 10-30-14, with CT AP 11-10-14 then showing progressive aortocaval and bilateral iliac adenopathy, treated with RT. CT CAP 05-07-15 with further progression including met to T4. Dr Sondra Come to see 05-13-15 and I will let Dr Faythe Ghee know, in case other options available that would be  preferable to adriamycin after anticipated RT completes to T spine.She is already scheduled back to Dr Faythe Ghee on 05-26-15.  2. LE edema after taxotere 10-20-14, venous dopplers negative and good EF. Resolved with RT improvement in adenopathy, not recurrent now tho scan shows some progression adenopathy. 3.PAC in, used for CT 05-07-15 4.morbid obesity, BMI 43,  post gastric banding with initial weight loss 50 lbs 5.degenerative arthritis knees: symptoms better with meloxicam. 6.flu vaccine 03-11-15. Pneumonia vaccine -13 03-21-15; due -23 on 05-28-15 per [facility]. 7.chronic alopecia preceding chemo 8.history nonmelanoma skin ca 9.history psoriasis, minimally recurrent on UE 10. No advance directives per EMR information  All questions answered. Cc Drs Letta Pate. Time spent 30 min including >50% counseling and coordination of care.       Liviah Cake P, MD   05/11/2015, 3:19 PM

## 2015-05-12 DIAGNOSIS — C7951 Secondary malignant neoplasm of bone: Secondary | ICD-10-CM | POA: Insufficient documentation

## 2015-05-13 ENCOUNTER — Ambulatory Visit
Admission: RE | Admit: 2015-05-13 | Discharge: 2015-05-13 | Disposition: A | Discharge status: Home / Self Care | Payer: Federal, State, Local not specified - PPO | Source: Ambulatory Visit | Attending: Radiation Oncology | Admitting: Radiation Oncology

## 2015-05-13 ENCOUNTER — Encounter: Payer: Self-pay | Admitting: Radiation Oncology

## 2015-05-13 VITALS — BP 138/64 | HR 79 | Temp 98.3°F | Resp 16 | Ht 60.0 in | Wt 221.6 lb

## 2015-05-13 DIAGNOSIS — C7951 Secondary malignant neoplasm of bone: Secondary | ICD-10-CM | POA: Diagnosis present

## 2015-05-13 DIAGNOSIS — C541 Malignant neoplasm of endometrium: Secondary | ICD-10-CM | POA: Diagnosis present

## 2015-05-13 DIAGNOSIS — Z510 Encounter for antineoplastic radiation therapy: Secondary | ICD-10-CM | POA: Diagnosis present

## 2015-05-13 NOTE — Progress Notes (Signed)
Radiation Oncology         [phone]) [phone] ________________________________  Name: Erin Santiago MRN: [id number]  Date: 05/13/2015  DOB: 07-29-1955     Follow-Up Visit Note  CC: Merrilee Seashore, MD  Gordy Levan, MD    ICD-9-CM ICD-10-CM   1. 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 The site and dose included the Pelvis at 45 gray in 25 fractions.   Narrative:  The patient returns today for reconsultation at the courtesy of Dr. Marko Plume after CAP CT (05/07/15) evidence of progressive metastatic endometrial stromal sarcoma. A new bone lesion in the right side of the T4 vertebral body, with soft tissue extension slightly impinging upon the central spinal canal was seen on this recent scan. The patient has been on waiting list for [facility] trial, which recently resumed enrollment.   Patient has pain in upper back "feels like a boil". Numbess in hands upper extremities but no weakness in  lower extremities. No vaginal bleeding. S/p chemotherapy June 2016.                               ALLERGIES:  has No Known Allergies.  Meds: Current Outpatient Prescriptions  Medication Sig Dispense Refill  . lidocaine-prilocaine (EMLA) cream Apply to Porta-cath 1-2 hrs prior to access. Cover with [employer]. 30 g 1  . meloxicam (MOBIC) 15 MG tablet Take 15 mg by mouth daily.   3  . Psyllium 28.3 % POWD Take 1 packet by mouth daily as needed.     Marland Kitchen Resveratrol 250 MG CAPS Take 250 mg by mouth 2 (two) times daily.    . Alum & Mag Hydroxide-Simeth (MAGIC MOUTHWASH W/LIDOCAINE) SOLN QID prn for mouth/throat irriation- may swish swallow and or spit (Patient not taking: Reported on 05/13/2015) 240 mL 0  . antiseptic oral rinse (BIOTENE) LIQD 15 mLs by Mouth Rinse route 4 (four)  times daily. (Patient not taking: Reported on 05/13/2015) 237 mL 0  . dexamethasone (DECADRON) 4 MG tablet Take 2 tabs with food twice a day x 3 days beginning day prior to Taxotere(Second week of Chemo) (Patient not taking: Reported on 05/13/2015) 12 tablet 2  . docusate sodium (COLACE) 100 MG capsule Take 100 mg by mouth 2 (two) times daily.     . ferrous fumarate (HEMOCYTE) 325 (106 FE) MG TABS tablet Take 1 tab daily on an empty stomach with OJ or Vitamin C 500 mg (Patient not taking: Reported on 05/13/2015) 30 each 3  . fluconazole (DIFLUCAN) 100 MG tablet Take along with steroids aroud Taxotere (Patient not taking: Reported on 05/13/2015) 12 tablet 1  . furosemide (LASIX) 20 MG tablet Take 1/2 tablet today for LE swelling, can take 1/2 tablet this weekend if swelling is not better. (Patient not taking: Reported on 05/13/2015) 3 tablet 0  . hyaluronate sodium (RADIAPLEXRX) GEL Apply 1 application topically 2 (two) times daily.    . hydrocortisone 2.5 % cream Apply 1 application topically as needed (itch/psoriasis.).     Marland Kitchen loperamide (IMODIUM) 2 MG capsule Take 2-4 mg by mouth 4 (four) times daily as needed for diarrhea or loose stools.    Marland Kitchen LORazepam (ATIVAN) 1 MG tablet Place 1/2-1 tablet under the tongue or  swallow every 6 hrs as needed for nausea. Will make you drowsy. (Patient not taking: Reported on 05/13/2015) 20 tablet 0  . ondansetron (ZOFRAN ODT) 8 MG disintegrating tablet Take 1 tablet (8 mg total) by mouth every 8 (eight) hours as needed for nausea or vomiting. (Patient not taking: Reported on 05/13/2015) 30 tablet 1  . sodium chloride (OCEAN) 0.65 % SOLN nasal spray Place 1 spray into both nostrils as needed for congestion. (Patient not taking: Reported on 05/13/2015) 30 mL 0  . traMADol (ULTRAM) 50 MG tablet May take 25-50 mg PO BID PRN. (1/2 to 1 whole tab) (Patient not taking: Reported on 05/13/2015) 20 tablet 0   No current facility-administered medications for this encounter.     Physical Findings: The patient is in no acute distress. Patient is alert and oriented.  height is 5' (1.524 m) and weight is 221 lb 9.6 oz (100.517 kg). Her oral temperature is 98.3 F (36.8 C). Her blood pressure is 138/64 and her pulse is 79. Her respiration is 16 and oxygen saturation is 100%. .  No significant changes. General: Well-developed, in no acute distress HEENT: Normocephalic, atraumatic; oral cavity clear Neck: Supple without any lymphadenopathy Cardiovascular: Regular rate and rhythm Respiratory: Clear to auscultation bilaterally GI: Soft, nontender, normal bowel sounds Extremities: No edema present Neuro: No focal deficits - Upper back pain upon palpation, thoracic spine region  Lab Findings: Lab Results  Component Value Date   WBC 4.6 04/02/2015   HGB 12.1 04/02/2015   HCT 37.0 04/02/2015   MCV 83.5 04/02/2015   PLT 235 04/02/2015    Radiographic Findings: Ct Chest W Contrast  05/07/2015  CLINICAL DATA:  Subsequent evaluation of a 59 year old with history of endometrial stromal sarcoma diagnosed in February 2016, status post chemotherapy completed in June 2016, and status post radiation therapy completed in August 2016. Currently asymptomatic EXAM: CT CHEST, ABDOMEN, AND PELVIS WITH CONTRAST TECHNIQUE: Multidetector CT imaging of the chest, abdomen and pelvis was performed following the standard protocol during bolus administration of intravenous contrast. CONTRAST:  132mL OMNIPAQUE IOHEXOL 300 MG/ML  SOLN COMPARISON:  CT of the chest, abdomen and pelvis 02/03/2015. FINDINGS: CT CHEST FINDINGS Mediastinum/Nodes: Heart size is borderline enlarged. There is no significant pericardial fluid, thickening or pericardial calcification. Severe mitral annular calcifications. Right internal jugular single-lumen porta cath with tip terminating at the superior cavoatrial junction. No pathologically enlarged mediastinal or hilar lymph nodes. Esophagus is unremarkable in  appearance. No axillary lymphadenopathy. Heterogeneous appearance of the thyroid gland, most notable for a 17 mm nodule in the right lobe of the gland which is partially calcified (unchanged and nonspecific). Lungs/Pleura: 3 mm right upper lobe pulmonary nodule (image 17 of series 5) is unchanged, as is a 2 mm right middle lobe pulmonary nodule (image 36 of series 5), and a 3 mm left upper lobe nodule (image 22 of series 5). No acute consolidative airspace disease. No pleural effusions. Mild scarring in the medial aspect of the right lower lobe adjacent to several prominent osteophytes is unchanged compared to numerous prior examinations. Musculoskeletal: New lytic lesion in the right side of the T4 vertebral body (image 16 of series 2) with some soft tissue component which slightly impinges upon the right side of the central spinal canal, compatible with a new metastatic lesion. CT ABDOMEN PELVIS FINDINGS Hepatobiliary: No cystic or solid hepatic lesions. No intra or extrahepatic biliary ductal dilatation. Gallbladder is normal in appearance. Pancreas: No pancreatic mass. No pancreatic ductal dilatation. No pancreatic or  peripancreatic fluid or inflammatory changes. Spleen: Unremarkable. Adrenals/Urinary Tract: Bilateral adrenal glands and bilateral kidneys are normal in appearance. No hydroureteronephrosis. Urinary bladder is normal in appearance. 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 No pathologic dilatation of small bowel or colon. A few scattered colonic diverticulae are noted, most evident in the proximal sigmoid colon, without surrounding inflammatory changes to suggest an acute diverticulitis at this time. Normal appendix. Vascular/Lymphatic: Mild atherosclerosis in the abdominal and pelvic vasculature, without evidence of aneurysm or dissection. Significant worsening lymphadenopathy along the left pelvic sidewall and in the retroperitoneum. Specific examples include a  31 mm short axis aortocaval lymph node (image 73 of series 2, which previously measured 25 mm in short axis, 21 mm short axis left common iliac lymph node (image 81 of series 2, which previously measured 18 mm in short axis, new left common iliac lymph node measuring 14 mm in short axis (previously nonenlarged) on image 84 of series 2, and left internal iliac lymph node which measures 23 mm in short axis (image 99 of series 2), previously only 13 mm. Borderline enlarged hepatoduodenal ligament lymph node measuring 13 mm in short axis is also incidentally noted, but unchanged compared to the prior study. Reproductive: Status post total abdominal hysterectomy and bilateral salpingo-oophorectomy. Other: New soft tissue lesion in the right lower quadrant of the abdomen (image 92 of series 2), concerning for peritoneal implant, measuring 2.3 x 1.5 cm. Previously noted cystic lesion along the left pelvic sidewall has significantly decreased in size and become more sessile in appearance, currently measuring only 15 x 44 mm (image 93 of series 2), likely a resolving postoperative seroma. Small volume of ascites in the low anatomic pelvis. No pneumoperitoneum. Musculoskeletal: There are no aggressive appearing lytic or blastic lesions noted in the visualized portions of the skeleton. IMPRESSION: 1. Today's study demonstrates progression of metastatic disease, with worsening left pelvic and retroperitoneal lymphadenopathy, and a new bone lesion in the right side of the T4 vertebral body, with soft tissue extension slightly impinging upon the central spinal canal. In addition, there is a probable mesenteric implant in the right lower quadrant of the abdomen (image 92 of series 2). 2. Decreased size of cystic lesion along left pelvic sidewall, which may reflect a positive response to therapy, or could simply represent a resolving postoperative seroma. 3. Trace volume of ascites. 4. Additional incidental findings, as above.  Electronically Signed   By: Vinnie Langton M.D.   On: 05/07/2015 17:09   Ct Abdomen Pelvis W Contrast  05/07/2015  CLINICAL DATA:  Subsequent evaluation of a 59 year old with history of endometrial stromal sarcoma diagnosed in February 2016, status post chemotherapy completed in June 2016, and status post radiation therapy completed in August 2016. Currently asymptomatic EXAM: CT CHEST, ABDOMEN, AND PELVIS WITH CONTRAST TECHNIQUE: Multidetector CT imaging of the chest, abdomen and pelvis was performed following the standard protocol during bolus administration of intravenous contrast. CONTRAST:  135mL OMNIPAQUE IOHEXOL 300 MG/ML  SOLN COMPARISON:  CT of the chest, abdomen and pelvis 02/03/2015. FINDINGS: CT CHEST FINDINGS Mediastinum/Nodes: Heart size is borderline enlarged. There is no significant pericardial fluid, thickening or pericardial calcification. Severe mitral annular calcifications. Right internal jugular single-lumen porta cath with tip terminating at the superior cavoatrial junction. No pathologically enlarged mediastinal or hilar lymph nodes. Esophagus is unremarkable in appearance. No axillary lymphadenopathy. Heterogeneous appearance of the thyroid gland, most notable for a 17 mm nodule in the right lobe of the gland which is partially calcified (  unchanged and nonspecific). Lungs/Pleura: 3 mm right upper lobe pulmonary nodule (image 17 of series 5) is unchanged, as is a 2 mm right middle lobe pulmonary nodule (image 36 of series 5), and a 3 mm left upper lobe nodule (image 22 of series 5). No acute consolidative airspace disease. No pleural effusions. Mild scarring in the medial aspect of the right lower lobe adjacent to several prominent osteophytes is unchanged compared to numerous prior examinations. Musculoskeletal: New lytic lesion in the right side of the T4 vertebral body (image 16 of series 2) with some soft tissue component which slightly impinges upon the right side of the central  spinal canal, compatible with a new metastatic lesion. CT ABDOMEN PELVIS FINDINGS Hepatobiliary: No cystic or solid hepatic lesions. No intra or extrahepatic biliary ductal dilatation. Gallbladder is normal in appearance. Pancreas: No pancreatic mass. No pancreatic ductal dilatation. No pancreatic or peripancreatic fluid or inflammatory changes. Spleen: Unremarkable. Adrenals/Urinary Tract: Bilateral adrenal glands and bilateral kidneys are normal in appearance. No hydroureteronephrosis. Urinary bladder is normal in appearance. 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 No pathologic dilatation of small bowel or colon. A few scattered colonic diverticulae are noted, most evident in the proximal sigmoid colon, without surrounding inflammatory changes to suggest an acute diverticulitis at this time. Normal appendix. Vascular/Lymphatic: Mild atherosclerosis in the abdominal and pelvic vasculature, without evidence of aneurysm or dissection. Significant worsening lymphadenopathy along the left pelvic sidewall and in the retroperitoneum. Specific examples include a 31 mm short axis aortocaval lymph node (image 73 of series 2, which previously measured 25 mm in short axis, 21 mm short axis left common iliac lymph node (image 81 of series 2, which previously measured 18 mm in short axis, new left common iliac lymph node measuring 14 mm in short axis (previously nonenlarged) on image 84 of series 2, and left internal iliac lymph node which measures 23 mm in short axis (image 99 of series 2), previously only 13 mm. Borderline enlarged hepatoduodenal ligament lymph node measuring 13 mm in short axis is also incidentally noted, but unchanged compared to the prior study. Reproductive: Status post total abdominal hysterectomy and bilateral salpingo-oophorectomy. Other: New soft tissue lesion in the right lower quadrant of the abdomen (image 92 of series 2), concerning for peritoneal implant,  measuring 2.3 x 1.5 cm. Previously noted cystic lesion along the left pelvic sidewall has significantly decreased in size and become more sessile in appearance, currently measuring only 15 x 44 mm (image 93 of series 2), likely a resolving postoperative seroma. Small volume of ascites in the low anatomic pelvis. No pneumoperitoneum. Musculoskeletal: There are no aggressive appearing lytic or blastic lesions noted in the visualized portions of the skeleton. IMPRESSION: 1. Today's study demonstrates progression of metastatic disease, with worsening left pelvic and retroperitoneal lymphadenopathy, and a new bone lesion in the right side of the T4 vertebral body, with soft tissue extension slightly impinging upon the central spinal canal. In addition, there is a probable mesenteric implant in the right lower quadrant of the abdomen (image 92 of series 2). 2. Decreased size of cystic lesion along left pelvic sidewall, which may reflect a positive response to therapy, or could simply represent a resolving postoperative seroma. 3. Trace volume of ascites. 4. Additional incidental findings, as above. Electronically Signed   By: Vinnie Langton M.D.   On: 05/07/2015 17:09    Impression:  The patient has been diagnosed with metastatic cancer to the spine. Recent CT scan has shown  a new bone lesion in the right side of the T4 vertebral body, with soft tissue extension slightly impinging upon the central spinal canal. The patient is experiencing pain in the upper back, consistent with this new finding. The patient would be a good candidate to receive palliative radiation treatment to the thoracic spine.   Plan:  We discussed the possible side effects and risks of treatment in addition to the possible benefits of treatment. We discussed the protocol for radiation treatment.  All of the patient's questions were answered. The patient does wish to proceed with this treatment. A simulation will be scheduled tomorrow such that  we can proceed with treatment planning. Anticipate between 10 and 14 radiation treatments   -----------------------------------  Blair Promise, PhD, MD  This document serves as a record of services personally performed by [name], MD. It was created on his behalf by Derek Mound, a trained medical scribe. The creation of this record is based on the scribe's personal observations and the provider's statements to them. This document has been checked and approved by the attending provider.

## 2015-05-13 NOTE — Progress Notes (Signed)
Please see the Nurse Progress Note in the MD Initial Consult Encounter for this patient. 

## 2015-05-14 ENCOUNTER — Ambulatory Visit
Admission: RE | Admit: 2015-05-14 | Discharge: 2015-05-14 | Disposition: A | Discharge status: Home / Self Care | Payer: Federal, State, Local not specified - PPO | Source: Ambulatory Visit | Attending: Radiation Oncology | Admitting: Radiation Oncology

## 2015-05-14 ENCOUNTER — Telehealth: Payer: Self-pay

## 2015-05-14 ENCOUNTER — Telehealth: Payer: Self-pay | Admitting: Oncology

## 2015-05-14 DIAGNOSIS — C541 Malignant neoplasm of endometrium: Secondary | ICD-10-CM

## 2015-05-14 DIAGNOSIS — Z510 Encounter for antineoplastic radiation therapy: Secondary | ICD-10-CM | POA: Diagnosis not present

## 2015-05-14 NOTE — Telephone Encounter (Signed)
Medical Oncology  LM for Dr Faythe Ghee at clinic on 05-12-15 LM on cell for her now, requesting return call [phone]  L.Marko Plume, MD

## 2015-05-14 NOTE — Telephone Encounter (Signed)
Ms. Selner came by the office to let Dr. Marko Plume know that she was able to get an earlier appointment with Dr. Faythe Ghee.  It is on 05-19-15 at 4 pm. Maylon Cos in [place] to have a disc burned of recent CT and previous one to bring to Dr. Orion Crook appointment on 05-19-15.   Pt. Can pick up the CD in [facility] radiology  after radiation appointment this morning.

## 2015-05-14 NOTE — Progress Notes (Signed)
  Radiation Oncology         [phone] ________________________________  Name: Erin Santiago MRN: [id number]  Date: 05/14/2015  DOB: 1955-11-12  SIMULATION AND TREATMENT PLANNING NOTE  No diagnosis found.  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  Identity was confirmed.  All relevant records and images related to the planned course of therapy were reviewed.  The patient freely provided informed written consent to proceed with treatment after reviewing the details related to the planned course of therapy. The consent form was witnessed and verified by the simulation staff.  Then, the patient was set-up in a stable reproducible  supine position for radiation therapy.  CT images were obtained.  Surface markings were placed.  The CT images were loaded into the planning software.  Then the target and avoidance structures were contoured.  Treatment planning then occurred.  The radiation prescription was entered and confirmed.  Then, I designed and supervised the construction of a total of 4 medically necessary complex treatment devices.  I have requested : 3D Simulation  I have requested a DVH of the following structures: GTV, PTV, spinal cord, esophagus.  I have ordered:dose calc.  PLAN:  The patient will receive 35 Gy in 14 fractions.  ________________________________   Special treatment procedure  Additional time was taken today in reviewing sodium of patient's treatment. She has had previous treatments to the peri-aortic and pelvis region. Additional time was taken in reviewing her previous treatment as it relates to her current set up and the potential for overlapping fields. In light of this special treatment procedure occurred.    Blair Promise, PhD, MD  This document serves as a record of services  personally performed by [name], MD. It was created on his behalf by Darcus Austin, a trained medical scribe. The creation of this record is based on the scribe's personal observations and the provider's statements to them. This document has been checked and approved by the attending provider.

## 2015-05-17 DIAGNOSIS — Z510 Encounter for antineoplastic radiation therapy: Secondary | ICD-10-CM | POA: Diagnosis not present

## 2015-05-18 ENCOUNTER — Ambulatory Visit
Admission: RE | Admit: 2015-05-18 | Discharge: 2015-05-18 | Disposition: A | Discharge status: Home / Self Care | Payer: Federal, State, Local not specified - PPO | Source: Ambulatory Visit | Attending: Radiation Oncology | Admitting: Radiation Oncology

## 2015-05-18 DIAGNOSIS — Z510 Encounter for antineoplastic radiation therapy: Secondary | ICD-10-CM | POA: Diagnosis not present

## 2015-05-19 ENCOUNTER — Ambulatory Visit
Admission: RE | Admit: 2015-05-19 | Discharge: 2015-05-19 | Disposition: A | Discharge status: Home / Self Care | Payer: Federal, State, Local not specified - PPO | Source: Ambulatory Visit | Attending: Radiation Oncology | Admitting: Radiation Oncology

## 2015-05-19 ENCOUNTER — Encounter: Payer: Self-pay | Admitting: Radiation Oncology

## 2015-05-19 ENCOUNTER — Ambulatory Visit: Payer: Federal, State, Local not specified - PPO | Admitting: Radiation Oncology

## 2015-05-19 VITALS — BP 121/58 | HR 77 | Temp 98.3°F | Resp 16 | Ht 60.0 in | Wt 220.8 lb

## 2015-05-19 DIAGNOSIS — C7951 Secondary malignant neoplasm of bone: Secondary | ICD-10-CM

## 2015-05-19 DIAGNOSIS — Z510 Encounter for antineoplastic radiation therapy: Secondary | ICD-10-CM | POA: Diagnosis not present

## 2015-05-19 DIAGNOSIS — C541 Malignant neoplasm of endometrium: Secondary | ICD-10-CM

## 2015-05-19 MED ORDER — RADIAPLEXRX EX GEL
Freq: Once | CUTANEOUS | Status: AC
  Administered 2015-05-19: 10:00:00 via TOPICAL

## 2015-05-19 NOTE — Progress Notes (Signed)
  Radiation Oncology         [phone] ________________________________  Name: Erin Santiago MRN: [id number]  Date: 05/19/2015  DOB: November 10, 1955  Weekly Radiation Therapy Management    ICD-9-CM ICD-10-CM   1. Endometrial stromal sarcoma ([facility]) 182.0 C54.1   2. Metastatic cancer to spine ([facility]) 198.5 C79.51      Current Dose: 5 Gy     Planned Dose:  35 Gy  Narrative . . . . . . . . The patient presents for routine under treatment assessment.                                   The patient is without complaint. No swallowing difficulties or fatigue.                                  Set-up films were reviewed.                                 The chart was checked. Physical Findings. . .  height is 5' (1.524 m) and weight is 220 lb 12.8 oz (100.154 kg). Her oral temperature is 98.3 F (36.8 C). Her blood pressure is 121/58 and her pulse is 77. Her respiration is 16. . The lungs are clear. The heart has a regular rhythm and rate  Impression . . . . . . . The patient is tolerating radiation. Plan . . . . . . . . . . . . Continue treatment as planned.  ________________________________   Blair Promise, PhD, MD

## 2015-05-19 NOTE — Progress Notes (Signed)
Erin Santiago has completed 2 fractions to her T spine.  She reports pain at a 1/10 in her right upper back.  She denies trouble swallowing.  She reports her appetite has decreased. She will see a oncology specialist and [facility] today.  BP 121/58 mmHg  Pulse 77  Temp(Src) 98.3 F (36.8 C) (Oral)  Resp 16  Ht 5' (1.524 m)  Wt 220 lb 12.8 oz (100.154 kg)  BMI 43.12 kg/m2  LMP 02/04/2014   Wt Readings from Last 3 Encounters:  05/19/15 220 lb 12.8 oz (100.154 kg)  05/13/15 221 lb 9.6 oz (100.517 kg)  05/11/15 220 lb 9.6 oz (100.064 kg)

## 2015-05-19 NOTE — Progress Notes (Signed)
Pt here for patient teaching.  Pt given Radiation and You booklet and Radiaplex gel. Pt reports they have not watched the Radiation Therapy Education video and was given the link to the video to watch at home.  Reviewed areas of pertinence such as fatigue, skin changes and throat changes . Pt able to give teach back of to pat skin and use unscented/gentle soap,apply Radiaplex bid and avoid applying anything to skin within 4 hours of treatment. Pt demonstrated understanding and verbalizes understanding of information given and will contact nursing with any questions or concerns.     [email or web address]/whattoexpect/index

## 2015-05-20 ENCOUNTER — Ambulatory Visit
Admission: RE | Admit: 2015-05-20 | Discharge: 2015-05-20 | Disposition: A | Discharge status: Home / Self Care | Payer: Federal, State, Local not specified - PPO | Source: Ambulatory Visit | Attending: Radiation Oncology | Admitting: Radiation Oncology

## 2015-05-20 ENCOUNTER — Telehealth: Payer: Self-pay | Admitting: Oncology

## 2015-05-20 ENCOUNTER — Other Ambulatory Visit: Payer: Self-pay | Admitting: Radiation Oncology

## 2015-05-20 DIAGNOSIS — Z510 Encounter for antineoplastic radiation therapy: Secondary | ICD-10-CM | POA: Diagnosis not present

## 2015-05-20 MED ORDER — SUCRALFATE 1 GM/10ML PO SUSP: 1.0000 g | Freq: Three times a day (TID) | ORAL | Status: DC

## 2015-05-20 NOTE — Telephone Encounter (Signed)
Left a message for Erin Santiago advising her that a prescription for Carafate had been send to her pharmacy.

## 2015-05-21 ENCOUNTER — Ambulatory Visit
Admission: RE | Admit: 2015-05-21 | Discharge: 2015-05-21 | Disposition: A | Discharge status: Home / Self Care | Payer: Federal, State, Local not specified - PPO | Source: Ambulatory Visit | Attending: Radiation Oncology | Admitting: Radiation Oncology

## 2015-05-21 ENCOUNTER — Ambulatory Visit: Payer: Federal, State, Local not specified - PPO | Admitting: Radiation Oncology

## 2015-05-21 ENCOUNTER — Telehealth: Payer: Self-pay | Admitting: Oncology

## 2015-05-21 DIAGNOSIS — Z510 Encounter for antineoplastic radiation therapy: Secondary | ICD-10-CM | POA: Diagnosis not present

## 2015-05-21 NOTE — Telephone Encounter (Signed)
pt came in to r/s appt-gave pt updated time & date of appt-refused print out

## 2015-05-22 ENCOUNTER — Ambulatory Visit: Payer: Federal, State, Local not specified - PPO

## 2015-05-22 ENCOUNTER — Ambulatory Visit
Admission: RE | Admit: 2015-05-22 | Discharge: 2015-05-22 | Disposition: A | Discharge status: Home / Self Care | Payer: Federal, State, Local not specified - PPO | Source: Ambulatory Visit | Attending: Radiation Oncology | Admitting: Radiation Oncology

## 2015-05-22 DIAGNOSIS — Z510 Encounter for antineoplastic radiation therapy: Secondary | ICD-10-CM | POA: Diagnosis not present

## 2015-05-25 ENCOUNTER — Ambulatory Visit
Admission: RE | Admit: 2015-05-25 | Discharge: 2015-05-25 | Disposition: A | Discharge status: Home / Self Care | Payer: Federal, State, Local not specified - PPO | Source: Ambulatory Visit | Attending: Radiation Oncology | Admitting: Radiation Oncology

## 2015-05-25 ENCOUNTER — Ambulatory Visit (HOSPITAL_BASED_OUTPATIENT_CLINIC_OR_DEPARTMENT_OTHER): Payer: Federal, State, Local not specified - PPO

## 2015-05-25 ENCOUNTER — Other Ambulatory Visit: Payer: Self-pay | Admitting: Oncology

## 2015-05-25 VITALS — BP 117/92 | HR 74 | Temp 98.5°F

## 2015-05-25 DIAGNOSIS — Z23 Encounter for immunization: Secondary | ICD-10-CM

## 2015-05-25 DIAGNOSIS — Z510 Encounter for antineoplastic radiation therapy: Secondary | ICD-10-CM | POA: Diagnosis not present

## 2015-05-25 MED ORDER — PNEUMOCOCCAL VAC POLYVALENT 25 MCG/0.5ML IJ INJ
0.5000 mL | INJECTION | Freq: Once | INTRAMUSCULAR | Status: AC
  Administered 2015-05-25: 0.5 mL via INTRAMUSCULAR
  Filled 2015-05-25: qty 0.5

## 2015-05-26 ENCOUNTER — Ambulatory Visit
Admission: RE | Admit: 2015-05-26 | Discharge: 2015-05-26 | Disposition: A | Discharge status: Home / Self Care | Payer: Federal, State, Local not specified - PPO | Source: Ambulatory Visit | Attending: Radiation Oncology | Admitting: Radiation Oncology

## 2015-05-26 ENCOUNTER — Telehealth: Payer: Self-pay | Admitting: Oncology

## 2015-05-26 ENCOUNTER — Encounter: Payer: Self-pay | Admitting: Radiation Oncology

## 2015-05-26 VITALS — BP 114/64 | HR 84 | Temp 98.0°F | Resp 18 | Ht 60.0 in | Wt 218.5 lb

## 2015-05-26 DIAGNOSIS — C7951 Secondary malignant neoplasm of bone: Secondary | ICD-10-CM

## 2015-05-26 DIAGNOSIS — Z510 Encounter for antineoplastic radiation therapy: Secondary | ICD-10-CM | POA: Diagnosis not present

## 2015-05-26 NOTE — Progress Notes (Signed)
Erin Santiago has completed 7 fractions to her T spine.  She reports having arthritis pain in her knees and occasional sharp pains in her right breast that started on Sunday.  She denies trouble swallowing but does report pain when she has to belch.   She is taking Tums for this which help.  She has not started taking Carafate yet.  She will see a specialist at [facility] on 1/9 for immunotherapy. Her skin is intact on back.  She denies having fatigue.  BP 114/64 mmHg  Pulse 84  Temp(Src) 98 F (36.7 C) (Oral)  Resp 18  Ht 5' (1.524 m)  Wt 218 lb 8 oz (99.111 kg)  BMI 42.67 kg/m2  LMP 02/04/2014

## 2015-05-26 NOTE — Telephone Encounter (Signed)
Patient aware of added appointment 12/22

## 2015-05-26 NOTE — Progress Notes (Signed)
  Radiation Oncology         [phone] ________________________________  Name: Erin Santiago MRN: [id number]  Date: 05/26/2015  DOB: [birth year]  Weekly Radiation Therapy Management    ICD-9-CM ICD-10-CM   1. Metastatic cancer to spine ([facility]) 198.5 C79.51      Current Dose: 17.5 Gy     Planned Dose:  35 Gy  Narrative . . . . . . . . The patient presents for routine under treatment assessment.                                   The patient is without complaint. She is having some mild esophageal symptoms. She has Carafate but has not started taking this medication she reports continued pain in the upper back with some radiation into the right scapula area                                 Set-up films were reviewed.                                 The chart was checked. Physical Findings. . .  height is 5' (1.524 m) and weight is 218 lb 8 oz (99.111 kg). Her oral temperature is 98 F (36.7 C). Her blood pressure is 114/64 and her pulse is 84. Her respiration is 18. . Weight essentially stable.  No significant changes. The lungs are clear. The heart has a regular rhythm and rate. No significant radiation reaction noted in the back area. Impression . . . . . . . The patient is tolerating radiation. Plan . . . . . . . . . . . . Continue treatment as planned.  ________________________________   Blair Promise, PhD, MD

## 2015-05-27 ENCOUNTER — Ambulatory Visit
Admission: RE | Admit: 2015-05-27 | Discharge: 2015-05-27 | Disposition: A | Discharge status: Home / Self Care | Payer: Federal, State, Local not specified - PPO | Source: Ambulatory Visit | Attending: Radiation Oncology | Admitting: Radiation Oncology

## 2015-05-27 ENCOUNTER — Other Ambulatory Visit: Payer: Self-pay | Admitting: Oncology

## 2015-05-27 DIAGNOSIS — Z510 Encounter for antineoplastic radiation therapy: Secondary | ICD-10-CM | POA: Diagnosis not present

## 2015-05-28 ENCOUNTER — Ambulatory Visit (HOSPITAL_BASED_OUTPATIENT_CLINIC_OR_DEPARTMENT_OTHER): Payer: Federal, State, Local not specified - PPO | Admitting: Oncology

## 2015-05-28 ENCOUNTER — Ambulatory Visit: Payer: Federal, State, Local not specified - PPO

## 2015-05-28 ENCOUNTER — Other Ambulatory Visit: Payer: Self-pay | Admitting: *Deleted

## 2015-05-28 ENCOUNTER — Encounter: Payer: Self-pay | Admitting: Oncology

## 2015-05-28 ENCOUNTER — Ambulatory Visit
Admission: RE | Admit: 2015-05-28 | Discharge: 2015-05-28 | Disposition: A | Discharge status: Home / Self Care | Payer: Federal, State, Local not specified - PPO | Source: Ambulatory Visit | Attending: Radiation Oncology | Admitting: Radiation Oncology

## 2015-05-28 VITALS — BP 131/73 | HR 80 | Temp 98.4°F | Resp 18 | Ht 60.0 in | Wt 215.9 lb

## 2015-05-28 DIAGNOSIS — C541 Malignant neoplasm of endometrium: Secondary | ICD-10-CM | POA: Diagnosis not present

## 2015-05-28 DIAGNOSIS — C775 Secondary and unspecified malignant neoplasm of intrapelvic lymph nodes: Secondary | ICD-10-CM | POA: Diagnosis not present

## 2015-05-28 DIAGNOSIS — Z510 Encounter for antineoplastic radiation therapy: Secondary | ICD-10-CM | POA: Diagnosis not present

## 2015-05-28 DIAGNOSIS — C7951 Secondary malignant neoplasm of bone: Secondary | ICD-10-CM

## 2015-05-28 DIAGNOSIS — Z95828 Presence of other vascular implants and grafts: Secondary | ICD-10-CM

## 2015-05-28 DIAGNOSIS — C772 Secondary and unspecified malignant neoplasm of intra-abdominal lymph nodes: Secondary | ICD-10-CM

## 2015-05-28 NOTE — Progress Notes (Signed)
OFFICE PROGRESS NOTE   May 28, 2015   Physicians:Emma Rossi/ Stark Jock, , Antony Blackbird, Dawson Bills [facility] sarcoma clinic),Ajith Nicholos Johns (PCP [facility]), M.Suzanne Hyacinth Meeker; Rise Paganini (ortho), Donzetta Starch  INTERVAL HISTORY:  Patient is seen, alone for visit and at her request, to discuss treatment options for recently progressive metastatic endometrial stromal sarcoma. Radiation to T4 is in process, planned thru 06-05-15. Last systemic treatment was adjuvant gemzar taxotere given 08-28-14 thru day 1 cycle 4 on 10-30-14, with progression of aortocaval and iliac adenopathy during that chemotherapy. She completed radiation to involved areas 01-05-15.  Most recent imaging was 3 month follow up CT CAP 05-07-15, showing new lytic lesion at T4, stable 2-3 mm pulmonary nodules, increased left pelvic and retroperitoneal nodes and RLQ peritoneal nodule.    Patient had follow up consultation with Dr Loree Fee on 05-19-15. She was consented for [facility]) and likely will have IR biopsy at [facility] for [facility] trial after 14 days post completion of RT. She is to see Dr Loree Fee again on 06-14-14. Per my conversation with Dr Loree Fee and information in [facility], scans have been reviewed by IR at [facility] and patient is felt good candidate for biopsy.  If not candidate for study, or if elects not to proceed on study, recommendation would be adriamycin (or doxil if unable to tolerate adria). Echocardiogram 08-2014 had EF 55-60%.    Patient notices some intermittenrt discomfort right lateral chest to right axilla, not clearly positional, no rash, has not needed medication for this. She is otherwise feeling well, with energy good for caring for animals, continuing to work part time for a few more weeks before officially retiring. Still no swelling LE. No SOB, cough, other chest symptoms. Some apparent GERD, has not tried carafate but will start that.  Bowels fine. No fever or  symptoms of infection. Bladder ok. No weakness. No problems with PAC. Watching diet and portions, slight intentional weight loss.Chronic arthritic symptoms in knees. Remainder of 10 point Review of Systems negative   PAC in, flushed 05-07-15 Flu vaccine 03-11-15. Pneumococcal -13 done 04-02-15; for pneumovax -23 05-28-15 (= 8 weeks)  ONCOLOGIC HISTORY Patient had been menopausal since age 22 until she had what seemed to her to be a menstrual period in Sept 2015, with large blood clot at completion of bleeding stopped then. She continued with lesser bleeding over next several months until she was seen in 06-2014 by Dr Rondel Baton. Hemoglobin was 12.7 on 07-04-14. CT CAP 06-25-14 had uterus 16.4 x 13.5 x 14.4 cm with marked expansion of endometrial canal by heterogeneous mass, bilateral pelvic sidewall adenopathy, iliac adenopathy with no definite retroperitoneal or mesenteric adenopathy, no ascites, no liver mets. Attempted endometrial sampling 06-26-14 and 07-03-14 was nondiagnostic; around that time the patient was passing clots and using one large maxipad hourly. She was seen by Dr Andrey Farmer 07-05-14, with uterine fundus palpable above umbilicus. She had surgery by Dr Stark Jock at [facility] on 07-14-14, which was exploratory laparotomy with TAH BSO, bilateral total pelvic lymphadenectomy and sampling of aortic and renal nodes. Pathology [facility] [phone]) found high grade endometrial stromal sarcoma with primary 18 cm, 8/45 nodes involved including 2 right pelvic and 6 left pelvic nodes, ER PR negative. Case was presented at [facility] multidisciplinary conference 07-23-14, with recommendation for PET CT to evaluate inguinal and portahepatis nodes, and to consider adjuvant gemzar taxotere and possibly follow with 4 cycles of adriamycin, then to consider whole pelvic RT. She saw Dr Andrey Farmer  for post op follow up on 07-28-14, with recommendation for gemzar taxotere and adriamycin, whole pelvic RT after chemo and consideration of Megace  maintenance after chemo (possibly prior to negative ER PR information). PET 08-01-14 had some uptake in aortocaval and left paraaortic regions. She had day 1 cycle 1 gemzar taxotere on 08-28-14, day 8 cycle 1 on 09-04-14 and neulasta on 09-06-14. Admitted with neutropenic fever on day 14 cycle 1, with oral mucositis and some diarrhea. Counts maintained cycle 2 using OnPro neulasta day 9. She had progressive LE swelling after cycle 3, with venous dopplers negative 10-23-14, then LE swelling and SOB so marked after day 1 cycle 4 that chemo was held. Restaging CT AP + CXR 11-10-14 had stable tiny left pulmonary nodule/ no pleural effusion and normal heart size; CT had necrotic aortocaval adenopathy, iliac adenopathy L>R and 7x10 cm fluid collection in pelvis. She received IMRT 45 gray in 25 fractions to pelvis from 6-27 thru 01-05-15, with resolution of LE swelling by completion of course. CT CAP 02-03-15 showed improvement in retroperitoneal and pelvic involvement. She went onto observation after RT, plan to wait until clear progressive disease before resuming treatment, possibly adriamycin vs on study. CT CAP 05-07-15 showed new lytic lesion at T4, stable 2-3 mm pulmonary nodules, increased left pelvic and retroperitoneal nodes and RLQ peritoneal nodule.   Objective:  Vital signs in last 24 hours:  BP 131/73 mmHg  Pulse 80  Temp(Src) 98.4 F (36.9 C) (Oral)  Resp 18  Ht 5' (1.524 m)  Wt 215 lb 14.4 oz (97.932 kg)  BMI 42.17 kg/m2  SpO2 100%  LMP 02/04/2014 Weight down 3 lb. Alert, oriented and appropriate. Ambulatory with some difficulty due to knees, unchanged.   HEENT:PERRL, sclerae not icteric. Oral mucosa moist without lesions, posterior pharynx clear.  Neck supple. No JVD.  Lymphatics:no cervical,supraclavicular, axillary or inguinal adenopathy Resp: clear to auscultation bilaterally and normal percussion bilaterally Cardio: regular rate and rhythm. No gallop. ZO:XWRUEAV obese,  soft, nontender,  not distended, cannot appreciate mass or organomegaly. Normally active bowel sounds. Surgical incision not remarkable. Musculoskeletal/ Extremities: Spine not tender to palpation. Right back not tender. LE without pitting edema, cords, tenderness Neuro: no increased peripheral neuropathy. Otherwise nonfocal. PSYCH appropriate mood and affect Skin without rash including right back to axilla, and without ecchymosis or petechiae Portacath-without erythema or tenderness  Lab Results:  Results for orders placed or performed in visit on 04/02/15  CBC with Differential  Result Value Ref Range   WBC 4.6 3.9 - 10.3 10e3/uL   NEUT# 3.4 1.5 - 6.5 10e3/uL   HGB 12.1 11.6 - 15.9 g/dL   HCT 37.0 34.8 - 46.6 %   Platelets 235 145 - 400 10e3/uL   MCV 83.5 79.5 - 101.0 fL   MCH 27.3 25.1 - 34.0 pg   MCHC 32.7 31.5 - 36.0 g/dL   RBC 4.43 3.70 - 5.45 10e6/uL   RDW 15.8 (H) 11.2 - 14.5 %   lymph# 0.6 (L) 0.9 - 3.3 10e3/uL   MONO# 0.4 0.1 - 0.9 10e3/uL   Eosinophils Absolute 0.2 0.0 - 0.5 10e3/uL   Basophils Absolute 0.0 0.0 - 0.1 10e3/uL   NEUT% 73.2 38.4 - 76.8 %   LYMPH% 12.1 (L) 14.0 - 49.7 %   MONO% 9.1 0.0 - 14.0 %   EOS% 5.2 0.0 - 7.0 %   BASO% 0.4 0.0 - 2.0 %  Comprehensive metabolic panel (Cmet) - [facility]  Result Value Ref Range   Sodium  141 136 - 145 mEq/L   Potassium 3.9 3.5 - 5.1 mEq/L   Chloride 110 (H) 98 - 109 mEq/L   CO2 25 22 - 29 mEq/L   Glucose 87 70 - 140 mg/dl   BUN 25.3 7.0 - 26.0 mg/dL   Creatinine 0.8 0.6 - 1.1 mg/dL   Total Bilirubin 0.92 0.20 - 1.20 mg/dL   Alkaline Phosphatase 72 40 - 150 U/L   AST 16 5 - 34 U/L   ALT 13 0 - 55 U/L   Total Protein 6.9 6.4 - 8.3 g/dL   Albumin 3.7 3.5 - 5.0 g/dL   Calcium 9.2 8.4 - 10.4 mg/dL   Anion Gap 6 3 - 11 mEq/L   EGFR 85 (L) >90 ml/min/1.73 m2     Studies/Results:  No results found.  Medications: I have reviewed the patient's current medications. Using mobic daily in AM with food. Has not tried carafate yet for GERD,  which I have encouraged. She is to let us know if not sufficiently helpful by mid next week, in which case would give protonix 40 mg daily. She does not feel that she needs any pain medication available, knows to call if right back discomfort worsens.  DISCUSSION Explained that all of the  multiple arms of MATCH trial are available at each participating institution, fact that biopsy for genetic testing needs to be at least 14 days after completion of all treatment including radiation, then time to process. Patient is concerned that she will not be treated during that period, but understands that it is possible that directed therapy could be more beneficial than standard chemotherapeutic agents available otherwise. She is concerned that biopsy might obtain only necrotic tissue which would not be adequate, wonders if path confirmation of viable tissue might be possible during biopsy procedure and will discuss this with Dr Faythe Ghee or with IR. She is reassured to learn that Dr Faythe Ghee has reviewed scans with IR, and we expect that she will discuss this with patient at upcoming visit 06-15-15.  I have told her that I am comfortable with time required for possible treatment on MATCH trial, as is Dr Faythe Ghee, and that we can certainly proceed directly with alternate standard chemotherapy if she is not eligible for MATCH study. Patient is comfortable with this plan by conclusion of visit.   Discussed fact that weight loss is acceptable and may be beneficial including with knees if due to improved, healthy diet and increased exercise.  Assessment/Plan:  1.high grade endometrial stromal sarcoma with bilateral pelvic node involvement at TAH BSO, pelvic lymphadenectomy and node sampling at [facility] 07-14-14. Gemzar taxotere begun 08-28-14, thru day 1 cycle 4 on 10-30-14, with CT AP 11-10-14 then showing progressive aortocaval and bilateral iliac adenopathy, treated with RT. CT CAP 05-07-15 with further  progression including met to T4. RT in process to T4 planned thru 06-05-15, minimally symptomatic.She will see Dr Faythe Ghee 06-15-15, likely IR biopsy for MATCH trial when out 14 days from this radiation. Off study would try adriamycin, but study preferable for this resistant and aggressive malignancy if possible.  She has not had zometa. No other bone involvement known. I do not know status of zometa with MATCH trial so will not do that now. Did not discuss zometa with patient at this visit. 2. LE edema after taxotere 10-20-14, venous dopplers negative and good EF. Resolved with RT improvement in adenopathy, not recurrent now tho scan shows some progression adenopathy. 3.PAC in, used for  CT 05-07-15 4.morbid obesity, BMI 43, post gastric banding with initial weight loss 50 lbs 5.degenerative arthritis knees: symptoms better with meloxicam. 6.flu vaccine 03-11-15. Pneumonia vaccine -13 03-21-15; due -23 on 05-28-15 per [place]. 7.chronic partial alopecia preceding chemo 8.history nonmelanoma skin ca 9.history psoriasis, minimally recurrent on UE 10. No advance directives per EMR information    All questions answered and patient knows to call if other questions or concerns prior to next visit. Time spent 35 min including >50% counseling and coordination of care. Cc Dr Faythe Ghee, [name], Dr Sondra Come, Dr Maia Plan, MD   05/28/2015, 1:14 PM

## 2015-05-29 ENCOUNTER — Ambulatory Visit
Admission: RE | Admit: 2015-05-29 | Discharge: 2015-05-29 | Disposition: A | Discharge status: Home / Self Care | Payer: Federal, State, Local not specified - PPO | Source: Ambulatory Visit | Attending: Radiation Oncology | Admitting: Radiation Oncology

## 2015-05-29 DIAGNOSIS — Z510 Encounter for antineoplastic radiation therapy: Secondary | ICD-10-CM | POA: Diagnosis not present

## 2015-06-02 ENCOUNTER — Ambulatory Visit
Admission: RE | Admit: 2015-06-02 | Discharge: 2015-06-02 | Disposition: A | Discharge status: Home / Self Care | Payer: Federal, State, Local not specified - PPO | Source: Ambulatory Visit | Attending: Radiation Oncology | Admitting: Radiation Oncology

## 2015-06-02 ENCOUNTER — Encounter: Payer: Self-pay | Admitting: *Deleted

## 2015-06-02 ENCOUNTER — Encounter: Payer: Self-pay | Admitting: Radiation Oncology

## 2015-06-02 VITALS — BP 146/74 | HR 74 | Temp 97.5°F | Ht 60.0 in | Wt 220.3 lb

## 2015-06-02 DIAGNOSIS — Z510 Encounter for antineoplastic radiation therapy: Secondary | ICD-10-CM | POA: Diagnosis not present

## 2015-06-02 DIAGNOSIS — C7951 Secondary malignant neoplasm of bone: Secondary | ICD-10-CM

## 2015-06-02 NOTE — Progress Notes (Signed)
[place] Psychosocial Distress Screening Clinical Social Work  Clinical Social Work was referred by distress screening protocol.  The patient scored a 7 on the Psychosocial Distress Thermometer which indicates severe distress. Clinical Social Worker reviewed chart and phoned pt to assess for distress and other psychosocial needs. CSW left vm and brief message explaining role of support team/csw and how to contact for support.   ONCBCN DISTRESS SCREENING 05/13/2015  Screening Type Initial Screening  Distress experienced in past week (1-10) 7  Emotional problem type Depression;Isolation/feeling alone;Feeling hopeless  Spiritual/Religous concerns type Facing my mortality  Information Concerns Type Lack of info about diagnosis;Lack of info about treatment  Physical Problem type Pain  Physician notified of physical symptoms Yes  Referral to clinical social work Yes    Clinical Social Worker follow up needed: Yes.    If yes, follow up plan: Awaits return call Erin Santiago, [place]  [facility] Phone: [phone] Fax: [phone]

## 2015-06-02 NOTE — Progress Notes (Signed)
Weekly Management Note Current Dose: 27.5 Gy  Projected Dose: 35 Gy   Narrative:  The patient presents for routine under treatment assessment.  CBCT/MVCT images/Port film x-rays were reviewed.  The chart was checked.  Erin Santiago is here for her 11th fraction of radiation to her T-spine. She admits to being slightly fatigued, but it is not overwhelming. She will take a nap if needed in the afternoon. She denies any pain, except for an occasional twinge which runs from her Right Scapula to her axilla and then to her Right arm. She denies any bowel, bladder, or nausea problems. She reports a decrease in heartburn issues since starting radiation. She has no increase of pain or difficulty swallowing. She has no other complaints at this time. She is anxious to find out about a research program she wants to get into at [facility].   Physical Findings:  Unchanged. Alert and oriented x3.  Vitals:  Filed Vitals:   06/02/15 1154  BP: 146/74  Pulse: 74  Temp: 97.5 F (36.4 C)   Weight:  Wt Readings from Last 3 Encounters:  06/02/15 220 lb 4.8 oz (99.927 kg)  05/28/15 215 lb 14.4 oz (97.932 kg)  05/26/15 218 lb 8 oz (99.111 kg)   Lab Results  Component Value Date   WBC 4.6 04/02/2015   HGB 12.1 04/02/2015   HCT 37.0 04/02/2015   MCV 83.5 04/02/2015   PLT 235 04/02/2015   Lab Results  Component Value Date   CREATININE 0.8 04/02/2015   BUN 25.3 04/02/2015   NA 141 04/02/2015   K 3.9 04/02/2015   CL 108 09/18/2014   CO2 25 04/02/2015     Impression:  The patient is tolerating radiation.  Plan:  Continue treatment as planned.  This document serves as a record of services personally performed by Thea Silversmith, MD. It was created on her behalf by Darcus Austin, a trained medical scribe. The creation of this record is based on the scribe's personal observations and the provider's statements to them. This document has been checked and approved by the attending provider.

## 2015-06-02 NOTE — Progress Notes (Signed)
Erin Santiago is here for her 11th fraction of radiation to her T-spine. She admits to being slightly fatigued, but it is not overwhelming. She will take a nap if needed in the afternoon. She denies any pain except an occasional twinge which runs from her Right Scapula to her axilla and then to her Right arm. She denies any bowel, bladder, or nausea problems. She reports a decrease in heartburn issues since starting radiation. She has no other complaints at this time. She is anxious to find out about a research program she wants to get into at [facility].   BP 146/74 mmHg  Pulse 74  Temp(Src) 97.5 F (36.4 C)  Ht 5' (1.524 m)  Wt 220 lb 4.8 oz (99.927 kg)  BMI 43.02 kg/m2  LMP 02/04/2014   Wt Readings from Last 3 Encounters:  06/02/15 220 lb 4.8 oz (99.927 kg)  05/28/15 215 lb 14.4 oz (97.932 kg)  05/26/15 218 lb 8 oz (99.111 kg)

## 2015-06-03 ENCOUNTER — Ambulatory Visit
Admission: RE | Admit: 2015-06-03 | Discharge: 2015-06-03 | Disposition: A | Discharge status: Home / Self Care | Payer: Federal, State, Local not specified - PPO | Source: Ambulatory Visit | Attending: Radiation Oncology | Admitting: Radiation Oncology

## 2015-06-03 DIAGNOSIS — Z510 Encounter for antineoplastic radiation therapy: Secondary | ICD-10-CM | POA: Diagnosis not present

## 2015-06-04 ENCOUNTER — Ambulatory Visit
Admission: RE | Admit: 2015-06-04 | Discharge: 2015-06-04 | Disposition: A | Discharge status: Home / Self Care | Payer: Federal, State, Local not specified - PPO | Source: Ambulatory Visit | Attending: Radiation Oncology | Admitting: Radiation Oncology

## 2015-06-04 DIAGNOSIS — Z510 Encounter for antineoplastic radiation therapy: Secondary | ICD-10-CM | POA: Diagnosis not present

## 2015-06-05 ENCOUNTER — Ambulatory Visit
Admission: RE | Admit: 2015-06-05 | Discharge: 2015-06-05 | Disposition: A | Discharge status: Home / Self Care | Payer: Federal, State, Local not specified - PPO | Source: Ambulatory Visit | Attending: Radiation Oncology | Admitting: Radiation Oncology

## 2015-06-05 ENCOUNTER — Ambulatory Visit: Payer: Federal, State, Local not specified - PPO

## 2015-06-05 DIAGNOSIS — Z510 Encounter for antineoplastic radiation therapy: Secondary | ICD-10-CM | POA: Diagnosis not present

## 2015-06-09 ENCOUNTER — Ambulatory Visit: Payer: Federal, State, Local not specified - PPO

## 2015-06-10 ENCOUNTER — Ambulatory Visit: Payer: Federal, State, Local not specified - PPO

## 2015-06-11 ENCOUNTER — Ambulatory Visit: Payer: Federal, State, Local not specified - PPO

## 2015-06-15 ENCOUNTER — Encounter: Payer: Self-pay | Admitting: Radiation Oncology

## 2015-06-15 NOTE — Progress Notes (Signed)
  Radiation Oncology         [phone]) [phone] ________________________________  Name: Erin Santiago MRN: [id number]  Date: 06/15/2015  DOB: 24-Feb-1956  End of Treatment Note  Diagnosis:   Endometrial stromal sarcoma, stage III-C (T2b, N1, M0), now with recurrence in the thoracic spine area.   Indication for treatment:   A new bone lesion in the right side of the T4 vertebral body, with soft tissue extension slightly impinging upon the central spinal canal, some pain from this lesion  Radiation treatment dates:   05/18/2015 through 06/05/2015  Site/dose:   T4 thoracic spine area, 35 gray in 14 fractions  Beams/energy:   3-D conformal  Narrative: The patient tolerated radiation treatment relatively well.   She developed some fatigue during the course for treatment with no significant esophageal symptoms. Pain somewhat improved  Plan: The patient has completed radiation treatment. The patient will return to radiation oncology clinic for routine followup in one month. I advised them to call or return sooner if they have any questions or concerns related to their recovery or treatment.  -----------------------------------  Blair Promise, PhD, MD

## 2015-06-23 ENCOUNTER — Telehealth: Payer: Self-pay | Admitting: *Deleted

## 2015-06-23 DIAGNOSIS — C541 Malignant neoplasm of endometrium: Secondary | ICD-10-CM

## 2015-06-23 NOTE — Telephone Encounter (Signed)
Re apt LL this week I can see her at 1630 on Thurs 1-19

## 2015-06-23 NOTE — Telephone Encounter (Signed)
"  I have an appointment to see Dr. Marko Plume 07-02-2015.  [facility] told me to get this moved up and be seen sooner for a trial at [facility].  I'd like to see her this week.  I'm having abdominal pain may be why.  A biopsy is scheduled 06-26-2015.  Abdominal pain is new and started 06-19-2015 to the lower right abdomen.  Intermittent pain that also occurs with movement.  Abdomen is tender to touch.  No swelling or bloating.  I eat and drink with no N/V.  Bowels move well with use of Gwendolyn Lima.  Have not tried anything OTC for pain relief."    Will notify Dr. Marko Plume of request to move appointment.  Offered [facility] visit.  "The pain is better today.  If the appointment can't be moved, the pain is better."

## 2015-06-23 NOTE — Telephone Encounter (Signed)
Spoke with Ms. Kohler and scheduled her for visit on 06-25-15 at 66 with Dr. Marko Plume. Scheduled lab for 1545 on 06-25-15 if needed. Ms. Fonda stated that she had her PAC flushed today 06-23-15 with Labs at [facility] for procedure-BX on 06-26-15.

## 2015-06-24 ENCOUNTER — Other Ambulatory Visit: Payer: Self-pay | Admitting: Oncology

## 2015-06-25 ENCOUNTER — Encounter: Payer: Self-pay | Admitting: Oncology

## 2015-06-25 ENCOUNTER — Other Ambulatory Visit: Payer: Federal, State, Local not specified - PPO

## 2015-06-25 ENCOUNTER — Other Ambulatory Visit (HOSPITAL_BASED_OUTPATIENT_CLINIC_OR_DEPARTMENT_OTHER): Payer: Federal, State, Local not specified - PPO

## 2015-06-25 ENCOUNTER — Ambulatory Visit (HOSPITAL_BASED_OUTPATIENT_CLINIC_OR_DEPARTMENT_OTHER): Payer: Federal, State, Local not specified - PPO | Admitting: Oncology

## 2015-06-25 VITALS — BP 126/58 | HR 67 | Temp 98.0°F | Resp 17 | Ht 60.0 in | Wt 217.1 lb

## 2015-06-25 DIAGNOSIS — C772 Secondary and unspecified malignant neoplasm of intra-abdominal lymph nodes: Secondary | ICD-10-CM | POA: Diagnosis not present

## 2015-06-25 DIAGNOSIS — C541 Malignant neoplasm of endometrium: Secondary | ICD-10-CM

## 2015-06-25 DIAGNOSIS — Z85828 Personal history of other malignant neoplasm of skin: Secondary | ICD-10-CM | POA: Diagnosis not present

## 2015-06-25 DIAGNOSIS — C775 Secondary and unspecified malignant neoplasm of intrapelvic lymph nodes: Secondary | ICD-10-CM | POA: Diagnosis not present

## 2015-06-25 DIAGNOSIS — C7951 Secondary malignant neoplasm of bone: Secondary | ICD-10-CM | POA: Diagnosis not present

## 2015-06-25 DIAGNOSIS — K5732 Diverticulitis of large intestine without perforation or abscess without bleeding: Secondary | ICD-10-CM

## 2015-06-25 LAB — COMPREHENSIVE METABOLIC PANEL
ALBUMIN: 3.5 g/dL (ref 3.5–5.0)
ALT: 11 U/L (ref 0–55)
ANION GAP: 8 meq/L (ref 3–11)
AST: 13 U/L (ref 5–34)
Alkaline Phosphatase: 78 U/L (ref 40–150)
BUN: 18.4 mg/dL (ref 7.0–26.0)
CO2: 27 mEq/L (ref 22–29)
Calcium: 9.4 mg/dL (ref 8.4–10.4)
Chloride: 106 mEq/L (ref 98–109)
Creatinine: 0.8 mg/dL (ref 0.6–1.1)
EGFR: 77 mL/min/{1.73_m2} — AB (ref >90)
GLUCOSE: 81 mg/dL (ref 70–140)
POTASSIUM: 4 meq/L (ref 3.5–5.1)
SODIUM: 141 meq/L (ref 136–145)
Total Bilirubin: 0.65 mg/dL (ref 0.20–1.20)
Total Protein: 7.2 g/dL (ref 6.4–8.3)

## 2015-06-25 LAB — CBC WITH DIFFERENTIAL/PLATELET
BASO%: 0.6 % (ref 0.0–2.0)
Basophils Absolute: 0 10*3/uL (ref 0.0–0.1)
EOS%: 7.6 % — ABNORMAL HIGH (ref 0.0–7.0)
Eosinophils Absolute: 0.4 10*3/uL (ref 0.0–0.5)
HCT: 38 % (ref 34.8–46.6)
HGB: 12.6 g/dL (ref 11.6–15.9)
LYMPH%: 8 % — AB (ref 14.0–49.7)
MCH: 28.3 pg (ref 25.1–34.0)
MCHC: 33.2 g/dL (ref 31.5–36.0)
MCV: 85.2 fL (ref 79.5–101.0)
MONO#: 0.5 10*3/uL (ref 0.1–0.9)
MONO%: 9.3 % (ref 0.0–14.0)
NEUT#: 4 10*3/uL (ref 1.5–6.5)
NEUT%: 74.5 % (ref 38.4–76.8)
Platelets: 225 10*3/uL (ref 145–400)
RBC: 4.46 10*6/uL (ref 3.70–5.45)
RDW: 13 % (ref 11.2–14.5)
WBC: 5.4 10*3/uL (ref 3.9–10.3)
lymph#: 0.4 10*3/uL — ABNORMAL LOW (ref 0.9–3.3)

## 2015-06-25 NOTE — Progress Notes (Signed)
OFFICE PROGRESS NOTE   June 25, 2015   Physicians:Emma Rossi/ Andrew Au, , [name], [name] [facility] sarcoma clinic),Ajith Ashby Dawes (PCP [facility]), [name]; [name], [name]  INTERVAL HISTORY:  Patient is seen as work in today, alone for visit at her request and recommendation of [facility] due to several days of RLQ abdominal pain in past week, which has now essentially resolved.  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 Patient is scheduled for biopsy at [facility] on 06-26-15 for [facility] trial. She understands that results of the biopsy will take 12-14 days, then, if eligible for any of the study arms, it will be ~ 4 additional weeks to start of study treatment. Patient tells me she is "excited to be doing this".  The RLQ pain began ~ 06-20-15 around time that she had been more constipated since stopping fiber supplement. She had no fever, no vomiting, still able to eat and drink fluids, no new or different bone pain. She added miralax and colace, RLQ pain improved after bowels moved. The discomfort is barely noticeable now, and not tender to touch. Bowels are moving better. Note diverticular disease seen on CTs and brother/ other family members with history of diverticulitis.   Otherwise, she has some minimal soreness to direct pressure in area of T4, no radiating pain. Energy is improved. She is enjoying recent retirement. She denies SOB, LE swelling, new neurologic symptoms, bladder changes. No problems with PAC   PAC in, flushed 05-07-15 Flu vaccine 03-11-15. Pneumococcal -13 done 04-02-15; pneumovax -23 05-25-15   ONCOLOGIC HISTORY Patient had been menopausal since age 89 until she had what seemed to her to be a menstrual period in Sept 2015, with large blood clot at completion of bleeding stopped then. She continued with lesser bleeding over next several months until she was  seen in 06-2014 by [name]. Hemoglobin was 12.7 on 07-04-14. CT CAP 06-25-14 had uterus 16.4 x 13.5 x 14.4 cm with marked expansion of endometrial canal by heterogeneous mass, bilateral pelvic sidewall adenopathy, iliac adenopathy with no definite retroperitoneal or mesenteric adenopathy, no ascites, no liver mets. Attempted endometrial sampling 06-26-14 and 07-03-14 was nondiagnostic; around that time the patient was passing clots and using one large maxipad hourly. She was seen by [name] 07-05-14, with uterine fundus palpable above umbilicus. She had surgery by [name] at [facility] on 07-14-14, which was exploratory laparotomy with TAH BSO, bilateral total pelvic lymphadenectomy and sampling of aortic and renal nodes. 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 Case was presented at [facility] multidisciplinary conference 07-23-14, with recommendation for PET CT to evaluate inguinal and portahepatis nodes, and to consider adjuvant gemzar taxotere and possibly follow with 4 cycles of adriamycin, then to consider whole pelvic RT. She saw [name] for post op follow up on 07-28-14, with recommendation for gemzar taxotere and adriamycin, whole pelvic RT after chemo and consideration of Megace maintenance after chemo (possibly prior to negative ER PR information). PET 08-01-14 had some uptake in aortocaval and left paraaortic regions. She had day 1 cycle 1 gemzar taxotere on 08-28-14, day 8 cycle 1 on 09-04-14 and neulasta on 09-06-14. Admitted with neutropenic fever on day 14 cycle 1, with oral mucositis and some diarrhea. Counts maintained cycle 2 using OnPro neulasta day 9. She had progressive LE  swelling after cycle 3, with venous dopplers negative 10-23-14, then LE swelling and SOB so marked after day 1 cycle 4 that chemo was held. Restaging CT AP + CXR 11-10-14 had stable tiny left pulmonary  nodule/ no pleural effusion and normal heart size; CT had necrotic aortocaval adenopathy, iliac adenopathy L>R and 7x10 cm fluid collection in pelvis. She received IMRT 45 gray in 25 fractions to pelvis from 6-27 thru 01-05-15, with resolution of LE swelling by completion of course. CT CAP 02-03-15 showed improvement in retroperitoneal and pelvic involvement. She went onto observation after RT, plan to wait until clear progressive disease before resuming treatment, possibly adriamycin vs on study. CT CAP 05-07-15 showed new lytic lesion at T4, stable 2-3 mm pulmonary nodules, increased left pelvic and retroperitoneal nodes and RLQ peritoneal nodule. Radiation therapy summary as follows: Indication for treatment: A new bone lesion in the right side of the T4 vertebral body, with soft tissue extension slightly impinging upon the central spinal canal, some pain from this lesion Radiation treatment dates: 05/18/2015 through 06/05/2015 Site/dose: T4 thoracic spine area, 35 gray in 14 fractions   Objective:  Vital signs in last 24 hours:  BP 126/58 mmHg  Pulse 67  Temp(Src) 98 F (36.7 C) (Oral)  Resp 17  Ht 5' (1.524 m)  Wt 217 lb 1.6 oz (98.476 kg)  BMI 42.40 kg/m2  SpO2 100%  LMP 02/04/2014 Weight down 3 lbs. Alert, oriented and appropriate. Ambulatory with difficulty as baseline, due to orthopedic problems with knees. Able to get on and off exam table with effort. Looks comfortable otherwise, very pleasant as always. NAD. Respirations not labored RA No alopecia  HEENT:PERRL, sclerae not icteric. Oral mucosa moist without lesions, posterior pharynx clear.  Neck supple. No JVD.  Lymphatics:no cervical,supraclavicular adenopathy Resp: clear to auscultation bilaterally and normal percussion bilaterally Cardio: regular rate and rhythm. No gallop. GI: abdomen obese, soft, nontender including RLQ, not distended, no appreciable mass or organomegaly. Normally active bowel sounds. Surgical incision  not remarkable. Musculoskeletal/ Extremities: Minimal tenderness to palpation in area of T4. LE without pitting edema, cords, tenderness Neuro: no change peripheral neuropathy. Otherwise nonfocal. PSYCH appropriate mood and affect Skin without rash, ecchymosis, petechiae Portacath-without erythema or tenderness  Lab Results:  Results for orders placed or performed in visit on 06/25/15  CBC with Differential  Result Value Ref Range   WBC 5.4 3.9 - 10.3 10e3/uL   NEUT# 4.0 1.5 - 6.5 10e3/uL   HGB 12.6 11.6 - 15.9 g/dL   HCT 38.0 34.8 - 46.6 %   Platelets 225 145 - 400 10e3/uL   MCV 85.2 79.5 - 101.0 fL   MCH 28.3 25.1 - 34.0 pg   MCHC 33.2 31.5 - 36.0 g/dL   RBC 4.46 3.70 - 5.45 10e6/uL   RDW 13.0 11.2 - 14.5 %   lymph# 0.4 (L) 0.9 - 3.3 10e3/uL   MONO# 0.5 0.1 - 0.9 10e3/uL   Eosinophils Absolute 0.4 0.0 - 0.5 10e3/uL   Basophils Absolute 0.0 0.0 - 0.1 10e3/uL   NEUT% 74.5 38.4 - 76.8 %   LYMPH% 8.0 (L) 14.0 - 49.7 %   MONO% 9.3 0.0 - 14.0 %   EOS% 7.6 (H) 0.0 - 7.0 %   BASO% 0.6 0.0 - 2.0 %  Comprehensive metabolic panel  Result Value Ref Range   Sodium 141 136 - 145 mEq/L   Potassium 4.0 3.5 - 5.1 mEq/L   Chloride 106 98 - 109 mEq/L   CO2 27 22 -  29 mEq/L   Glucose 81 70 - 140 mg/dl   BUN 18.4 7.0 - 26.0 mg/dL   Creatinine 0.8 0.6 - 1.1 mg/dL   Total Bilirubin 0.65 0.20 - 1.20 mg/dL   Alkaline Phosphatase 78 40 - 150 U/L   AST 13 5 - 34 U/L   ALT 11 0 - 55 U/L   Total Protein 7.2 6.4 - 8.3 g/dL   Albumin 3.5 3.5 - 5.0 g/dL   Calcium 9.4 8.4 - 10.4 mg/dL   Anion Gap 8 3 - 11 mEq/L   EGFR 77 (L) >90 ml/min/1.73 m2     Studies/Results:  No results found.  CT AP 05-07-15 report reviewed in regards to recent RLQ pain  Medications: I have reviewed the patient's current medications, no changes  DISCUSSION RLQ symptoms suggest slight diverticular disease which has improved. She will let MD know if symptoms worsen. Resume fiber supplement and keep bowels moving well  daily.  Rationale for MATCH trial reviewed. She will let us know what she hears from [facility] trial eligibility, probably in ~ 2 weeks after biopsy; I am sure we will also hear from Dr Faythe Ghee / MATCH coordinator Waldemar Dickens. My next apt to be scheduled depending on those plans.  [facility] likely will use PAC for biopsy tomorrow. If not, needs flush by Jan 26. Since patient seen today, I have cancelled my visit (and PAC flush) previously scheduled for 07-02-15.     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 Gemzar taxotere begun 08-28-14, thru day 1 cycle 4 on 10-30-14, with CT AP 11-10-14 showing progressive aortocaval and bilateral iliac adenopathy, treated with RT. CT CAP 05-07-15 with further progression including met to T4, minimally symptomatic. RT completed to T4 as of 06-05-15.Dr Faythe Ghee seeing for possible MATCH trial,  IR biopsy for MATCH trial at [facility] 06-26-15 (when out 14 days from radiation).  Off study would try adriamycin (or doxil), but study is preferred for this resistant and aggressive malignancy if possible.  She has not had zometa. No other bone involvement known. I do not know status of zometa with MATCH trial so have not added that at least now. Have not discussed zometa with patient.  LE edema after taxotere 10-20-14, venous dopplers negative and good EF. Resolved with RT improvement in adenopathy, not recurrent now tho most recent scan shows some progression adenopathy. 2.RLQ pain earlier this week possibly related to symptomatic diverticular changes aggravated by stopping fiber supplement and some constipation. Improved with bowel regimen now, continue and let MD know if further problems. Last CT 05-07-15 3.PAC in, used for CT 05-07-15. Will need flush by 07-02-15 if not used for biopsy at [facility] 06-26-15. 4.morbid obesity, BMI 43, post gastric banding  with initial weight loss 50 lbs 5.degenerative arthritis knees: symptoms better with meloxicam but mobility limited 6.flu vaccine 03-11-15. 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 No advance directives per EMR information    All questions answered. My scheduled apt 07-02-15 cancelled, will set up next apt here and follow up PAC flushes pending [facility] information. She knows to call if concerns or to update [place]. Cc Dr Faythe Ghee, Dr Ashby Dawes, Dr Sondra Come. Time spent 25 min including >50% counseling and coordination of care.    Gordy Levan, MD  06/25/2015, 5:13 PM

## 2015-06-26 ENCOUNTER — Telehealth: Payer: Self-pay | Admitting: Oncology

## 2015-06-26 NOTE — Telephone Encounter (Signed)
Per 1/19 pof 1/26 appts have been cancelled

## 2015-06-27 DIAGNOSIS — K5732 Diverticulitis of large intestine without perforation or abscess without bleeding: Secondary | ICD-10-CM | POA: Insufficient documentation

## 2015-07-02 ENCOUNTER — Ambulatory Visit: Payer: Federal, State, Local not specified - PPO | Admitting: Oncology

## 2015-07-02 ENCOUNTER — Other Ambulatory Visit: Payer: Federal, State, Local not specified - PPO

## 2015-07-08 ENCOUNTER — Encounter: Payer: Self-pay | Admitting: Oncology

## 2015-07-09 ENCOUNTER — Ambulatory Visit
Admission: RE | Admit: 2015-07-09 | Discharge: 2015-07-09 | Disposition: A | Discharge status: Home / Self Care | Payer: Federal, State, Local not specified - PPO | Source: Ambulatory Visit | Attending: Radiation Oncology | Admitting: Radiation Oncology

## 2015-07-09 ENCOUNTER — Encounter: Payer: Self-pay | Admitting: Radiation Oncology

## 2015-07-09 VITALS — BP 128/76 | HR 68 | Temp 98.2°F | Resp 16 | Ht 60.0 in | Wt 215.9 lb

## 2015-07-09 DIAGNOSIS — C541 Malignant neoplasm of endometrium: Secondary | ICD-10-CM

## 2015-07-09 NOTE — Progress Notes (Signed)
Radiation Oncology         [phone]) [phone] ________________________________  Name: Erin Santiago MRN: [id number]  Date: 07/09/2015  DOB: 1956/05/20  Follow-Up Visit Note  CC: Merrilee Seashore, MD  Gordy Levan, MD    Diagnosis:   Endometrial stromal sarcoma, stage III-c (T2b, N1, M0), now with recurrence in the thoracic spine area.  Interval Since Last Radiation:  1 month. Completed 05/18/2015-06/05/2015 to T4 thoracic spine area with 35 Gy in 14 fractions.  Narrative:  The patient returns today for routine follow-up. She reports occasional pain between her shoulder blades. She denies having a sore throat or trouble swallowing. She reports that she has tingling/numbness under both arms that is occuring less often. She denies having any skin irritation in the treatment area. She reports her energy level is improving. She had a biopsy for DNA sequencing of her tumor for [facility] study on 06/26/15. She has recently retired. She mentions that her pain has improved since radiation and is gradually getting better. She is currently taking Dulcolax for her constipation.  ALLERGIES:  has No Known Allergies.  Meds: Current Outpatient Prescriptions  Medication Sig Dispense Refill  . calcium carbonate (TUMS - DOSED IN MG ELEMENTAL CALCIUM) 500 MG chewable tablet Chew 1 tablet by mouth daily.    Marland Kitchen docusate sodium (COLACE) 100 MG capsule Take 100 mg by mouth 2 (two) times daily. Reported on 05/28/2015    . hydrocortisone 2.5 % cream Apply 1 application topically as needed (itch/psoriasis.). Reported on 06/02/2015    . lidocaine-prilocaine (EMLA) cream Apply to Porta-cath 1-2 hrs prior to access. Cover with [employer]. 30 g 1  . meloxicam (MOBIC) 15 MG tablet Take 15 mg by mouth daily.   3  . Psyllium 28.3 % POWD Take 1 packet by mouth daily as needed. Reported on 05/26/2015    . Resveratrol 250 MG CAPS Take 250 mg by mouth 2 (two) times daily. Reported on 05/26/2015    . ferrous fumarate (HEMOCYTE)  325 (106 FE) MG TABS tablet Take 1 tab daily on an empty stomach with OJ or Vitamin C 500 mg (Patient not taking: Reported on 07/09/2015) 30 each 3  . loperamide (IMODIUM) 2 MG capsule Take 2-4 mg by mouth 4 (four) times daily as needed for diarrhea or loose stools. Reported on 07/09/2015    . sodium chloride (OCEAN) 0.65 % SOLN nasal spray Place 1 spray into both nostrils as needed for congestion. (Patient not taking: Reported on 07/09/2015) 30 mL 0  . sucralfate (CARAFATE) 1 GM/10ML suspension Take 10 mLs (1 g total) by mouth 4 (four) times daily -  with meals and at bedtime. (Patient not taking: Reported on 07/09/2015) 420 mL 0  . traMADol (ULTRAM) 50 MG tablet May take 25-50 mg PO BID PRN. (1/2 to 1 whole tab) (Patient not taking: Reported on 07/09/2015) 20 tablet 0   No current facility-administered medications for this encounter.    Physical Findings: The patient is in no acute distress. Patient is alert and oriented.  height is 5' (1.524 m) and weight is 215 lb 14.4 oz (97.932 kg). Her oral temperature is 98.2 F (36.8 C). Her blood pressure is 128/76 and her pulse is 68. Her respiration is 16 and oxygen saturation is 100%. .  No significant changes. The lungs are clear to auscultation. The heart has a regular rhythm and rate. No palpable subclavicular or axillary adenopathy. Mild tenderness in the upper thoracic spine.  Lab Findings: Lab Results  Component Value Date  WBC 5.4 06/25/2015   HGB 12.6 06/25/2015   HCT 38.0 06/25/2015   MCV 85.2 06/25/2015   PLT 225 06/25/2015    Radiographic Findings: No results found.  Impression:  The patient is recovering from the effects of radiation.  Plan:  She will follow up with me on an as needed basis. She will continue to follow up with Dr. Marko Plume. If she has any further questions or concerns about her recovery and treatment, she has been encouraged to contact Dr. Sondra Come, MD,  PhD.  ____________________________________ -----------------------------------  Blair Promise, PhD, MD    This document serves as a record of services personally performed by [name], MD. It was created on his behalf by Lendon Collar, a trained medical scribe. The creation of this record is based on the scribe's personal observations and the provider's statements to them. This document has been checked and approved by the attending provider.

## 2015-07-09 NOTE — Progress Notes (Signed)
Erin Santiago here for follow up.  She reports occasional pain between her shoulder blades.  She denies having a sore throat or trouble swallowing.  She reports that she has tingling/numbness under both arms that is occuring less often.  She denies having any skin irritation in the treatment area.  She reports her energy level is improving.  She had a biopsy for DNA sequencing of her tumor for [facility] study on 06/26/15.    BP 128/76 mmHg  Pulse 68  Temp(Src) 98.2 F (36.8 C) (Oral)  Resp 16  Ht 5' (1.524 m)  Wt 215 lb 14.4 oz (97.932 kg)  BMI 42.17 kg/m2  SpO2 100%  LMP 02/04/2014   Wt Readings from Last 3 Encounters:  07/09/15 215 lb 14.4 oz (97.932 kg)  06/25/15 217 lb 1.6 oz (98.476 kg)  06/02/15 220 lb 4.8 oz (99.927 kg)

## 2015-07-10 ENCOUNTER — Ambulatory Visit: Payer: Federal, State, Local not specified - PPO | Admitting: Certified Nurse Midwife

## 2015-07-15 ENCOUNTER — Telehealth: Payer: Self-pay | Admitting: Oncology

## 2015-07-15 ENCOUNTER — Other Ambulatory Visit: Payer: Self-pay | Admitting: Oncology

## 2015-07-15 DIAGNOSIS — C541 Malignant neoplasm of endometrium: Secondary | ICD-10-CM

## 2015-07-15 NOTE — Telephone Encounter (Signed)
Spoke to patient and she is aware of 2/10 appointment date/time per 2/8 pof

## 2015-07-15 NOTE — Telephone Encounter (Signed)
MEDICAL ONCOLOGY  Phone call from Dr Karel Jarvis [place], to let me know that patient does not have any of the findings being tested on [facility] trial. Dr Faythe Ghee has spoken with Ms Hedinger, recommending off study that she be treated either with adria + olaratumab as more aggressive approach, vs doxil as less aggressive approach. Dr Faythe Ghee will check with [facility] in case in trials available with Dr Caleen Jobs and will let us know if so. Otherwise she is glad to see Ms Treminio if progression on next regimen, in case other options available then.  POF sent for appointment with this MD 07-17-15  L.Marko Plume, MD

## 2015-07-17 ENCOUNTER — Encounter: Payer: Self-pay | Admitting: Oncology

## 2015-07-17 ENCOUNTER — Other Ambulatory Visit: Payer: Self-pay | Admitting: Oncology

## 2015-07-17 ENCOUNTER — Ambulatory Visit (HOSPITAL_BASED_OUTPATIENT_CLINIC_OR_DEPARTMENT_OTHER): Payer: Federal, State, Local not specified - PPO

## 2015-07-17 ENCOUNTER — Ambulatory Visit (HOSPITAL_BASED_OUTPATIENT_CLINIC_OR_DEPARTMENT_OTHER): Payer: Federal, State, Local not specified - PPO | Admitting: Oncology

## 2015-07-17 VITALS — BP 144/89 | HR 63 | Temp 97.8°F | Resp 18 | Ht 60.0 in | Wt 217.4 lb

## 2015-07-17 DIAGNOSIS — C541 Malignant neoplasm of endometrium: Secondary | ICD-10-CM | POA: Diagnosis not present

## 2015-07-17 DIAGNOSIS — Z452 Encounter for adjustment and management of vascular access device: Secondary | ICD-10-CM

## 2015-07-17 DIAGNOSIS — C775 Secondary and unspecified malignant neoplasm of intrapelvic lymph nodes: Secondary | ICD-10-CM

## 2015-07-17 DIAGNOSIS — C7951 Secondary malignant neoplasm of bone: Secondary | ICD-10-CM | POA: Diagnosis not present

## 2015-07-17 DIAGNOSIS — C772 Secondary and unspecified malignant neoplasm of intra-abdominal lymph nodes: Secondary | ICD-10-CM

## 2015-07-17 DIAGNOSIS — Z79899 Other long term (current) drug therapy: Secondary | ICD-10-CM

## 2015-07-17 DIAGNOSIS — Z95828 Presence of other vascular implants and grafts: Secondary | ICD-10-CM

## 2015-07-17 LAB — COMPREHENSIVE METABOLIC PANEL
ALBUMIN: 3.5 g/dL (ref 3.5–5.0)
ALK PHOS: 87 U/L (ref 40–150)
ALT: 12 U/L (ref 0–55)
ANION GAP: 9 meq/L (ref 3–11)
AST: 18 U/L (ref 5–34)
BILIRUBIN TOTAL: 0.85 mg/dL (ref 0.20–1.20)
BUN: 18.4 mg/dL (ref 7.0–26.0)
CALCIUM: 9.2 mg/dL (ref 8.4–10.4)
CO2: 23 mEq/L (ref 22–29)
CREATININE: 0.8 mg/dL (ref 0.6–1.1)
Chloride: 108 mEq/L (ref 98–109)
EGFR: 79 mL/min/{1.73_m2} — ABNORMAL LOW (ref >90)
Glucose: 93 mg/dl (ref 70–140)
Potassium: 4.1 mEq/L (ref 3.5–5.1)
Sodium: 140 mEq/L (ref 136–145)
TOTAL PROTEIN: 7.1 g/dL (ref 6.4–8.3)

## 2015-07-17 LAB — CBC WITH DIFFERENTIAL/PLATELET
BASO%: 1.1 % (ref 0.0–2.0)
Basophils Absolute: 0.1 10*3/uL (ref 0.0–0.1)
EOS%: 6.4 % (ref 0.0–7.0)
Eosinophils Absolute: 0.3 10*3/uL (ref 0.0–0.5)
HCT: 37.9 % (ref 34.8–46.6)
HEMOGLOBIN: 12.3 g/dL (ref 11.6–15.9)
LYMPH%: 10.2 % — AB (ref 14.0–49.7)
MCH: 27.2 pg (ref 25.1–34.0)
MCHC: 32.5 g/dL (ref 31.5–36.0)
MCV: 83.7 fL (ref 79.5–101.0)
MONO#: 0.4 10*3/uL (ref 0.1–0.9)
MONO%: 6.9 % (ref 0.0–14.0)
NEUT%: 75.4 % (ref 38.4–76.8)
NEUTROS ABS: 3.9 10*3/uL (ref 1.5–6.5)
PLATELETS: 245 10*3/uL (ref 145–400)
RBC: 4.53 10*6/uL (ref 3.70–5.45)
RDW: 13.6 % (ref 11.2–14.5)
WBC: 5.2 10*3/uL (ref 3.9–10.3)
lymph#: 0.5 10*3/uL — ABNORMAL LOW (ref 0.9–3.3)

## 2015-07-17 MED ORDER — HEPARIN SOD (PORK) LOCK FLUSH 100 UNIT/ML IV SOLN
500.0000 [IU] | Freq: Once | INTRAVENOUS | Status: AC
Start: 2015-07-17 — End: 2015-07-17
  Administered 2015-07-17: 500 [IU] via INTRAVENOUS
  Filled 2015-07-17: qty 5

## 2015-07-17 MED ORDER — SODIUM CHLORIDE 0.9% FLUSH
10.0000 mL | INTRAVENOUS | Status: DC | PRN
  Administered 2015-07-17: 10 mL via INTRAVENOUS
  Filled 2015-07-17: qty 10

## 2015-07-17 NOTE — Progress Notes (Signed)
OFFICE PROGRESS NOTE   July 17, 2015   Physicians:Emma Rossi/ Andrew Au, , [name], [name] [facility]),Ajith Ashby Dawes (PCP [facility]), [name]; [name], [name]  INTERVAL HISTORY:   Patient is seen, alone for visit following phone conversation with Dr Faythe Ghee earlier this week to let me know that patient does not have tumor findings to allow treatment on any of the arms of the [facility] trial.  Off study, Dr Faythe Ghee recommends either adriamycin + olaratumab, or doxil. Patient has come today for discussion of these options. Dr Faythe Ghee has also told me of her experience with the olaratumab, as this has only recently been approved. She has kindly offered to see patient again if progression on next regimen, in case other trials available at that time.   Patient has felt much better just in past week since completing radiation to T4 06-05-15. She has some minimal, positional discomfort just lateral to spine at ~ T4 area, no other pain. Swelling in feet and lower legs has also seemed some better recently. Appetite is good. RLQ pain thought possibly diverticular symptoms resolved,  bowels now moving regularly, no abdominal or pelvic discomfort. No bladder symptoms, no bleeding, no problems with PAC. No fever or symptoms of infection. She felt well enough to go to a livestock show earlier this week.  Pateint reminds me that she had significant aches with neulasta but no N/V and did not need any prn antiemetics with gemzar taxotere  Remainder of 10 point Review of Systems negative   PAC in, flushed 07-17-15 Flu vaccine 03-11-15. Pneumococcal -13 done 04-02-15; pneumovax -23 05-25-15   ONCOLOGIC HISTORY Patient had been menopausal since age 60 until she had what seemed to her to be a menstrual period in Sept 2015, with large blood clot at completion of bleeding stopped then. She continued with lesser bleeding over next  several months until she was seen in 06-2014 by [name]. Hemoglobin was 12.7 on 07-04-14. CT CAP 06-25-14 had uterus 16.4 x 13.5 x 14.4 cm with marked expansion of endometrial canal by heterogeneous mass, bilateral pelvic sidewall adenopathy, iliac adenopathy with no definite retroperitoneal or mesenteric adenopathy, no ascites, no liver mets. Attempted endometrial sampling 06-26-14 and 07-03-14 was nondiagnostic; around that time the patient was passing clots and using one large maxipad hourly. She was seen by [name] 07-05-14, with uterine fundus palpable above umbilicus. She had surgery by [name] at [facility] on 07-14-14, which was exploratory laparotomy with TAH BSO, bilateral total pelvic lymphadenectomy and sampling of aortic and renal nodes. 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 Case was presented at [facility] multidisciplinary conference 07-23-14, with recommendation for PET CT to evaluate inguinal and portahepatis nodes, and to consider adjuvant gemzar taxotere and possibly follow with 4 cycles of adriamycin, then to consider whole pelvic RT. She saw [name] for post op follow up on 07-28-14, with recommendation for gemzar taxotere and adriamycin, whole pelvic RT after chemo and consideration of Megace maintenance after chemo (possibly prior to negative ER PR information). PET 08-01-14 had some uptake in aortocaval and left paraaortic regions. She had day 1 cycle 1 gemzar taxotere on 08-28-14, day 8 cycle 1 on 09-04-14 and neulasta on 09-06-14. Admitted with neutropenic fever on day 14 cycle 1, with oral mucositis and some diarrhea. Counts maintained cycle 2 using  OnPro neulasta day 9. She had progressive LE swelling after cycle 3, with venous dopplers negative 10-23-14, then LE swelling and SOB so marked after day 1 cycle 4 that chemo was held. Restaging CT AP + CXR 11-10-14 had  stable tiny left pulmonary nodule/ no pleural effusion and normal heart size; CT had necrotic aortocaval adenopathy, iliac adenopathy L>R and 7x10 cm fluid collection in pelvis. She received IMRT 45 gray in 25 fractions to pelvis from 6-27 thru 01-05-15, with resolution of LE swelling by completion of course. CT CAP 02-03-15 showed improvement in retroperitoneal and pelvic involvement. She went onto observation after RT, plan to wait until clear progressive disease before resuming treatment, possibly adriamycin vs on study. CT CAP 05-07-15 showed new lytic lesion at T4, stable 2-3 mm pulmonary nodules, increased left pelvic and retroperitoneal nodes and RLQ peritoneal nodule. Radiation therapy summary as follows: Indication for treatment: A new bone lesion in the right side of the T4 vertebral body, with soft tissue extension slightly impinging upon the central spinal canal, some pain from this lesion Radiation treatment dates: 05/18/2015 through 06/05/2015 Site/dose: T4 thoracic spine area, 35 gray in 14 fractions   Objective:  Vital signs in last 24 hours:  BP 144/89 mmHg  Pulse 63  Temp(Src) 97.8 F (36.6 C) (Oral)  Resp 18  Ht 5' (1.524 m)  Wt 217 lb 6.4 oz (98.612 kg)  BMI 42.46 kg/m2  SpO2 100%  LMP 02/04/2014 Weight stable Alert, oriented and appropriate. Ambulatory with some difficulty due to chronic orthopedic problems Partial alopecia  HEENT:PERRL, sclerae not icteric. Oral mucosa moist without lesions, posterior pharynx clear.  Neck supple. No JVD.  Lymphatics:no cervical,supraclavicular, axillary or inguinal adenopathy Resp: clear to auscultation bilaterally and normal percussion bilaterally Cardio: regular rate and rhythm. No gallop. GI: abdomen obese, soft, nontender, not distended, no appreciable mass or organomegaly. Normally active bowel sounds. Surgical incision not remarkable. Musculoskeletal/ Extremities: without pitting edema, cords, tenderness Neuro: slight  residual peripheral neuropathy. Otherwise nonfocal. PSYCH appropriate mood and affect Skin without rash, ecchymosis, petechiae Portacath-without erythema or tenderness  Lab Results:  Results for orders placed or performed in visit on 07/17/15  CBC with Differential  Result Value Ref Range   WBC 5.2 3.9 - 10.3 10e3/uL   NEUT# 3.9 1.5 - 6.5 10e3/uL   HGB 12.3 11.6 - 15.9 g/dL   HCT 37.9 34.8 - 46.6 %   Platelets 245 145 - 400 10e3/uL   MCV 83.7 79.5 - 101.0 fL   MCH 27.2 25.1 - 34.0 pg   MCHC 32.5 31.5 - 36.0 g/dL   RBC 4.53 3.70 - 5.45 10e6/uL   RDW 13.6 11.2 - 14.5 %   lymph# 0.5 (L) 0.9 - 3.3 10e3/uL   MONO# 0.4 0.1 - 0.9 10e3/uL   Eosinophils Absolute 0.3 0.0 - 0.5 10e3/uL   Basophils Absolute 0.1 0.0 - 0.1 10e3/uL   NEUT% 75.4 38.4 - 76.8 %   LYMPH% 10.2 (L) 14.0 - 49.7 %   MONO% 6.9 0.0 - 14.0 %   EOS% 6.4 0.0 - 7.0 %   BASO% 1.1 0.0 - 2.0 %  Comprehensive metabolic panel  Result Value Ref Range   Sodium 140 136 - 145 mEq/L   Potassium 4.1 3.5 - 5.1 mEq/L   Chloride 108 98 - 109 mEq/L   CO2 23 22 - 29 mEq/L   Glucose 93 70 - 140 mg/dl   BUN 18.4 7.0 - 26.0 mg/dL   Creatinine 0.8 0.6 - 1.1 mg/dL  Total Bilirubin 0.85 0.20 - 1.20 mg/dL   Alkaline Phosphatase 87 40 - 150 U/L   AST 18 5 - 34 U/L   ALT 12 0 - 55 U/L   Total Protein 7.1 6.4 - 8.3 g/dL   Albumin 3.5 3.5 - 5.0 g/dL   Calcium 9.2 8.4 - 10.4 mg/dL   Anion Gap 9 3 - 11 mEq/L   EGFR 79 (L) >90 ml/min/1.73 m2     Studies/Results:  Will repeat CT CAP for baseline for starting treatment, as last imaging was CT 05-07-15  With recent RT to T4, will also repeat echocardiogram, last 08-2014 with EF 55-60%   Information from [facility] trial immunohistochemistry received and will be scanned into this EMR, summarized: PTEN positive     retained expression MLH1 positive     retained expression MSH2 positive      retained expression Keratin cocktail ppositive    AE1/AE3, CAM5.2, MNF116, keratin 8+18 CD10    Positive Smooth muscle actin  Scattered positive tumor cells  Medications: I have reviewed the patient's current medications. She will let us know if she does not have antiemetics at home (zofran,ativan,may need compazine, message to [facility] pharmacists to clarify decadron 8 mg day after chemo then 8 mg bid x 2 days). On pro neulasta preferable due to travel distance if possible  DISCUSSION Discussed fact that she is not eligible for [facility] trial based on specifics of this tumor. Discussed options of adriamycin + olaratumab as more aggressive and possibly more beneficial treatment, vs doxil as less likely to have significant side effects and less frequent treatments. I have talked with her about the phase  2 trials with olaratumab + adria vs single agent adriamycin, this in 133 patients and showing increased OS of 11-18 months in the combination arm. We have discussed possible side effects of adriamycin, and I have talked with her about listed possible side effects of olaratumab including fatigue, neuropathy, HA, alopecia, hyperglycemia, hypo K/Mg/phos, nausea, mucositis, diarrhea, neutropenia and lymphopenia, pain, infusion reactions. She has been given written information on both adriamycin and olaratumab; she has had teaching by RN also. Patient would like to try treatment with the combination if insurance allows. Per [place], drug available.  Verbal consent for treatment obtained.  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 Gemzar taxotere begun 08-28-14, thru day 1 cycle 4 on 10-30-14, with CT AP 11-10-14 showing progressive aortocaval and bilateral iliac adenopathy, treated with RT. CT CAP 05-07-15 with further progression including met to T4, minimally symptomatic. RT completed to T4 as of 06-05-15.No mutations eligible for MATCH trial,from biopsy done at [facility] 06-26-15 (14  days out from radiation). Will repeat CT CAP for baseline, repeat echocardiogram due to T4 radiation, request preauthorization for adriamycin + olaratumab and begin treatment in ~ 7-10 days if possible. Olaratumab will be days 1 and 8 every 21 days, with adriamycin day 1 every 21 days. She has not had zometa. No other bone involvement known.  Have not discussed zometa with patient. LE edema after taxotere 10-20-14, venous dopplers negative and good EF. Resolved with RT improvement in adenopathy, not recurrent now tho most recent scan shows some progression adenopathy. 2.RLQ pain a few weeks ago possibly related to symptomatic diverticular disease aggravated by stopping fiber supplement and some constipation. Resolved. Continue bowel regimen 3.PAC in 4.morbid obesity, BMI 43, post gastric banding with initial weight loss 50 lbs  5.degenerative arthritis knees: symptoms better with meloxicam but mobility limited 6.flu vaccine 03-11-15. 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 No advance directives per EMR information   All questions answered. Chemotherapy and olaratumab orders placed, will have pharmacist review. Request for these + on pro neulasta to managed care. We will let patient know when treatment and treatment dates have been confirmed. TIme spent 45 min including >50% counseling and coordination of care. Cc Drs Denman George, Thurman Coyer- Jeannine Kitten, Ramachandran   Gordy Levan, MD   07/17/2015, 8:01 PM

## 2015-07-17 NOTE — Patient Instructions (Signed)

## 2015-07-20 ENCOUNTER — Encounter: Payer: Self-pay | Admitting: Oncology

## 2015-07-20 ENCOUNTER — Telehealth: Payer: Self-pay

## 2015-07-20 NOTE — Telephone Encounter (Signed)
Spoke with patient and she is aware of her new appointments and aware that we will be calling with an echo,echo sent to [place] for precert and [name] dale has been cc on this as well       Erin Santiago

## 2015-07-21 ENCOUNTER — Ambulatory Visit (HOSPITAL_COMMUNITY)
Admission: RE | Admit: 2015-07-21 | Discharge: 2015-07-21 | Disposition: A | Discharge status: Home / Self Care | Payer: Federal, State, Local not specified - PPO | Source: Ambulatory Visit | Attending: Oncology | Admitting: Oncology

## 2015-07-21 DIAGNOSIS — C541 Malignant neoplasm of endometrium: Secondary | ICD-10-CM | POA: Insufficient documentation

## 2015-07-21 DIAGNOSIS — R590 Localized enlarged lymph nodes: Secondary | ICD-10-CM | POA: Insufficient documentation

## 2015-07-21 DIAGNOSIS — C7951 Secondary malignant neoplasm of bone: Secondary | ICD-10-CM | POA: Diagnosis not present

## 2015-07-21 DIAGNOSIS — C772 Secondary and unspecified malignant neoplasm of intra-abdominal lymph nodes: Secondary | ICD-10-CM

## 2015-07-21 MED ORDER — IOHEXOL 300 MG/ML  SOLN
100.0000 mL | Freq: Once | INTRAMUSCULAR | Status: AC | PRN
  Administered 2015-07-21: 100 mL via INTRAVENOUS

## 2015-07-22 ENCOUNTER — Telehealth: Payer: Self-pay | Admitting: Oncology

## 2015-07-22 MED ORDER — ONDANSETRON HCL 8 MG PO TABS: 8.0000 mg | ORAL_TABLET | Freq: Three times a day (TID) | ORAL | Status: DC | PRN

## 2015-07-22 MED ORDER — LORAZEPAM 1 MG PO TABS: ORAL_TABLET | ORAL | Status: DC

## 2015-07-22 NOTE — Telephone Encounter (Signed)
Called patient and set up Echo for 07-23-15 at [address]. Register at [place]    Erin Santiago has ativan 1 mg tabs and Zofran 8 mg tabs on hand for nausea. This are goo through 08-19-15.  She will then need new prescriptions.

## 2015-07-22 NOTE — Telephone Encounter (Signed)
Patient called office to confirm next appointment date/times

## 2015-07-23 ENCOUNTER — Other Ambulatory Visit: Payer: Federal, State, Local not specified - PPO

## 2015-07-23 ENCOUNTER — Ambulatory Visit (HOSPITAL_COMMUNITY)
Admission: RE | Admit: 2015-07-23 | Discharge: 2015-07-23 | Disposition: A | Discharge status: Home / Self Care | Payer: Federal, State, Local not specified - PPO | Source: Ambulatory Visit | Attending: Oncology | Admitting: Oncology

## 2015-07-23 DIAGNOSIS — C541 Malignant neoplasm of endometrium: Secondary | ICD-10-CM | POA: Diagnosis not present

## 2015-07-23 DIAGNOSIS — Z79899 Other long term (current) drug therapy: Secondary | ICD-10-CM | POA: Diagnosis not present

## 2015-07-23 NOTE — Progress Notes (Signed)
*  PRELIMINARY RESULTS* Echocardiogram 2D Echocardiogram has been performed.  Erin Santiago 07/23/2015, 12:44 PM

## 2015-07-24 ENCOUNTER — Other Ambulatory Visit: Payer: Self-pay | Admitting: Oncology

## 2015-07-24 ENCOUNTER — Telehealth: Payer: Self-pay | Admitting: Oncology

## 2015-07-24 ENCOUNTER — Telehealth: Payer: Self-pay

## 2015-07-24 NOTE — Telephone Encounter (Signed)
MEDICAL ONCOLOGY  [place] pharmacist let me know that we do not have full dose of olaratumab available for 07-27-15, tho expect this can be available on 07-28-15. Note preauthorization obtained for olaratumab and doxorubicin.  Spoke with infusion scheduler: cancelled treatment 07-27-15, rescheduled for 07-30-15 lab at 1215 and Rx 1315.  Spoke with patient by phone. Told her results of echocardiogram and CT CAP, and explained rescheduling due to drug availability as above. Patient understands all of this information and is in agreement with new date and times.  She has had no new or different problems since seen last.   Patient thanked me for the call.  Godfrey Pick, MD

## 2015-07-24 NOTE — Telephone Encounter (Signed)
-----   Message from Gordy Levan, MD sent at 07/24/2015  1:00 PM EST ----- Labs seen and need follow up: see note re echocardiogram also.  Please let her know chest stable with no increase or new lung nodules, does have some slight increase in size of lymph nodes abdomen pelvis and area in RLQ abdomen. We will follow these on chemo, and I am glad we did get the new CT for baseline

## 2015-07-24 NOTE — Telephone Encounter (Signed)
Dr. Marko Plume spoke directly with Ms. Andes regarding results of the scan and echo.

## 2015-07-24 NOTE — Telephone Encounter (Signed)
Echocardiogram from 07-23-15 shows good heart function per Dr. Marko Plume.

## 2015-07-27 ENCOUNTER — Ambulatory Visit: Payer: Federal, State, Local not specified - PPO

## 2015-07-27 ENCOUNTER — Telehealth: Payer: Self-pay

## 2015-07-27 ENCOUNTER — Other Ambulatory Visit: Payer: Federal, State, Local not specified - PPO

## 2015-07-27 MED ORDER — PROCHLORPERAZINE MALEATE 10 MG PO TABS: 10.0000 mg | ORAL_TABLET | Freq: Four times a day (QID) | ORAL | Status: DC | PRN

## 2015-07-27 MED ORDER — ONDANSETRON HCL 8 MG PO TABS
8.0000 mg | ORAL_TABLET | Freq: Two times a day (BID) | ORAL | Status: DC | PRN
Start: 2015-07-27 — End: 2015-07-27

## 2015-07-27 MED ORDER — DEXAMETHASONE 4 MG PO TABS: ORAL_TABLET | ORAL | Status: DC

## 2015-07-27 NOTE — Telephone Encounter (Signed)
-----   Message from Gordy Levan, MD sent at 07/24/2015  4:55 PM EST ----- I signed pretreatment orders for her upcoming cycle 1 adria olaratumab, including decadron after chemo and no zofran until day 3 due to aloxi.  I did not tell her about these med changes in my phone call 2-17.  RN please let her know about the decadron and the different zofran dosing due to aloxi, and I believe also compazine is in that order set.  Pharmacy please go back over zofran instructions with her when she comes for treatment 2-23 @ 1:15, as she has not had aloxi before.  Thank you

## 2015-07-27 NOTE — Telephone Encounter (Signed)
Zofran, ativan, decadron, and compazine sent to patient's pharmacy.  Reviewed use of these meds after treatment. Told Erin Santiago to bring meds to treatment appointment.  The pharmacist will review how to use them after her treatment 07-30-15. Erin Santiago has plenty of EMLA cream and a RF left on current prescription.

## 2015-07-27 NOTE — Telephone Encounter (Signed)
1. Erin Santiago wanted to know if she should keep her appointment with Dr. Marko Plume on 08-03-15 or R/S visit to 08-06-15 as her D8 C1 treatment will be   Thursday 08-06-15. 2. Chemotherapy appointment for 08-03-15 needs to be R/S to 08-06-15. 3. The On Pro injector needs to be ordered as well.

## 2015-07-28 ENCOUNTER — Other Ambulatory Visit: Payer: Self-pay | Admitting: Oncology

## 2015-07-28 NOTE — Telephone Encounter (Signed)
Re treatment scheduling POF done now for day 8 cycle 1 on 08-06-15 Keep LL 2-27 since just starting new regimen I need to see her prior to day 8 in case changes needed.  thanks

## 2015-07-28 NOTE — Telephone Encounter (Signed)
Told Erin Santiago to keep appointment with Dr. Marko Plume as scheduled for 08-03-15.  Treatment will be rescheduled to 08-06-15.  Dr. Marko Plume sent orders to schedulers to arrange appointments.

## 2015-07-29 ENCOUNTER — Ambulatory Visit: Payer: Federal, State, Local not specified - PPO

## 2015-07-30 ENCOUNTER — Ambulatory Visit: Payer: Federal, State, Local not specified - PPO

## 2015-07-30 ENCOUNTER — Inpatient Hospital Stay (HOSPITAL_COMMUNITY)
Admission: EM | Admit: 2015-07-30 | Discharge: 2015-08-27 | DRG: 393 | Disposition: A | Discharge status: Other Acute Inpatient Hospital | Payer: Federal, State, Local not specified - PPO | Attending: Internal Medicine | Admitting: Internal Medicine

## 2015-07-30 ENCOUNTER — Emergency Department (HOSPITAL_COMMUNITY): Payer: Federal, State, Local not specified - PPO

## 2015-07-30 ENCOUNTER — Other Ambulatory Visit: Payer: Self-pay

## 2015-07-30 ENCOUNTER — Other Ambulatory Visit: Payer: Federal, State, Local not specified - PPO

## 2015-07-30 ENCOUNTER — Telehealth: Payer: Self-pay

## 2015-07-30 ENCOUNTER — Encounter (HOSPITAL_COMMUNITY): Payer: Self-pay | Admitting: Emergency Medicine

## 2015-07-30 ENCOUNTER — Telehealth: Payer: Self-pay | Admitting: Oncology

## 2015-07-30 DIAGNOSIS — D500 Iron deficiency anemia secondary to blood loss (chronic): Secondary | ICD-10-CM | POA: Diagnosis present

## 2015-07-30 DIAGNOSIS — C7951 Secondary malignant neoplasm of bone: Secondary | ICD-10-CM | POA: Diagnosis not present

## 2015-07-30 DIAGNOSIS — M170 Bilateral primary osteoarthritis of knee: Secondary | ICD-10-CM | POA: Diagnosis present

## 2015-07-30 DIAGNOSIS — E876 Hypokalemia: Secondary | ICD-10-CM | POA: Diagnosis present

## 2015-07-30 DIAGNOSIS — Z833 Family history of diabetes mellitus: Secondary | ICD-10-CM

## 2015-07-30 DIAGNOSIS — Z9221 Personal history of antineoplastic chemotherapy: Secondary | ICD-10-CM

## 2015-07-30 DIAGNOSIS — E440 Moderate protein-calorie malnutrition: Secondary | ICD-10-CM | POA: Diagnosis present

## 2015-07-30 DIAGNOSIS — K1230 Oral mucositis (ulcerative), unspecified: Secondary | ICD-10-CM | POA: Diagnosis present

## 2015-07-30 DIAGNOSIS — Z923 Personal history of irradiation: Secondary | ICD-10-CM

## 2015-07-30 DIAGNOSIS — B9620 Unspecified Escherichia coli [E. coli] as the cause of diseases classified elsewhere: Secondary | ICD-10-CM | POA: Diagnosis present

## 2015-07-30 DIAGNOSIS — R188 Other ascites: Secondary | ICD-10-CM | POA: Diagnosis present

## 2015-07-30 DIAGNOSIS — N1330 Unspecified hydronephrosis: Secondary | ICD-10-CM | POA: Diagnosis not present

## 2015-07-30 DIAGNOSIS — E872 Acidosis: Secondary | ICD-10-CM | POA: Diagnosis present

## 2015-07-30 DIAGNOSIS — Z6841 Body Mass Index (BMI) 40.0 and over, adult: Secondary | ICD-10-CM

## 2015-07-30 DIAGNOSIS — R509 Fever, unspecified: Secondary | ICD-10-CM

## 2015-07-30 DIAGNOSIS — K651 Peritoneal abscess: Secondary | ICD-10-CM | POA: Diagnosis present

## 2015-07-30 DIAGNOSIS — K632 Fistula of intestine: Secondary | ICD-10-CM | POA: Diagnosis not present

## 2015-07-30 DIAGNOSIS — E860 Dehydration: Secondary | ICD-10-CM | POA: Diagnosis present

## 2015-07-30 DIAGNOSIS — Z905 Acquired absence of kidney: Secondary | ICD-10-CM | POA: Diagnosis not present

## 2015-07-30 DIAGNOSIS — D649 Anemia, unspecified: Secondary | ICD-10-CM | POA: Diagnosis present

## 2015-07-30 DIAGNOSIS — D6481 Anemia due to antineoplastic chemotherapy: Secondary | ICD-10-CM | POA: Diagnosis present

## 2015-07-30 DIAGNOSIS — B370 Candidal stomatitis: Secondary | ICD-10-CM | POA: Diagnosis not present

## 2015-07-30 DIAGNOSIS — K631 Perforation of intestine (nontraumatic): Secondary | ICD-10-CM | POA: Diagnosis present

## 2015-07-30 DIAGNOSIS — L409 Psoriasis, unspecified: Secondary | ICD-10-CM | POA: Diagnosis present

## 2015-07-30 DIAGNOSIS — Z9884 Bariatric surgery status: Secondary | ICD-10-CM | POA: Diagnosis not present

## 2015-07-30 DIAGNOSIS — Z90710 Acquired absence of both cervix and uterus: Secondary | ICD-10-CM

## 2015-07-30 DIAGNOSIS — C775 Secondary and unspecified malignant neoplasm of intrapelvic lymph nodes: Secondary | ICD-10-CM | POA: Diagnosis not present

## 2015-07-30 DIAGNOSIS — C541 Malignant neoplasm of endometrium: Secondary | ICD-10-CM | POA: Diagnosis present

## 2015-07-30 DIAGNOSIS — Z8249 Family history of ischemic heart disease and other diseases of the circulatory system: Secondary | ICD-10-CM

## 2015-07-30 DIAGNOSIS — K630 Abscess of intestine: Secondary | ICD-10-CM | POA: Diagnosis not present

## 2015-07-30 DIAGNOSIS — C772 Secondary and unspecified malignant neoplasm of intra-abdominal lymph nodes: Secondary | ICD-10-CM | POA: Diagnosis not present

## 2015-07-30 DIAGNOSIS — R1110 Vomiting, unspecified: Secondary | ICD-10-CM

## 2015-07-30 DIAGNOSIS — N74 Female pelvic inflammatory disorders in diseases classified elsewhere: Secondary | ICD-10-CM | POA: Diagnosis not present

## 2015-07-30 DIAGNOSIS — Z807 Family history of other malignant neoplasms of lymphoid, hematopoietic and related tissues: Secondary | ICD-10-CM | POA: Diagnosis not present

## 2015-07-30 DIAGNOSIS — R109 Unspecified abdominal pain: Secondary | ICD-10-CM | POA: Diagnosis present

## 2015-07-30 DIAGNOSIS — R112 Nausea with vomiting, unspecified: Secondary | ICD-10-CM

## 2015-07-30 DIAGNOSIS — D72829 Elevated white blood cell count, unspecified: Secondary | ICD-10-CM

## 2015-07-30 DIAGNOSIS — T451X5A Adverse effect of antineoplastic and immunosuppressive drugs, initial encounter: Secondary | ICD-10-CM | POA: Diagnosis present

## 2015-07-30 DIAGNOSIS — L8990 Pressure ulcer of unspecified site, unspecified stage: Secondary | ICD-10-CM | POA: Insufficient documentation

## 2015-07-30 DIAGNOSIS — N739 Female pelvic inflammatory disease, unspecified: Secondary | ICD-10-CM | POA: Diagnosis not present

## 2015-07-30 LAB — COMPREHENSIVE METABOLIC PANEL
ALK PHOS: 60 U/L (ref 38–126)
ALT: 13 U/L — ABNORMAL LOW (ref 14–54)
ANION GAP: 12 (ref 5–15)
AST: 23 U/L (ref 15–41)
Albumin: 3.9 g/dL (ref 3.5–5.0)
BUN: 29 mg/dL — ABNORMAL HIGH (ref 6–20)
CALCIUM: 8.5 mg/dL — AB (ref 8.9–10.3)
CO2: 19 mmol/L — AB (ref 22–32)
Chloride: 102 mmol/L (ref 101–111)
Creatinine, Ser: 1.06 mg/dL — ABNORMAL HIGH (ref 0.44–1.00)
GFR, EST NON AFRICAN AMERICAN: 56 mL/min — AB (ref >60)
Glucose, Bld: 125 mg/dL — ABNORMAL HIGH (ref 65–99)
Potassium: 3.5 mmol/L (ref 3.5–5.1)
SODIUM: 133 mmol/L — AB (ref 135–145)
TOTAL PROTEIN: 7.5 g/dL (ref 6.5–8.1)
Total Bilirubin: 3.4 mg/dL — ABNORMAL HIGH (ref 0.3–1.2)

## 2015-07-30 LAB — URINE MICROSCOPIC-ADD ON

## 2015-07-30 LAB — URINALYSIS, ROUTINE W REFLEX MICROSCOPIC
Bilirubin Urine: NEGATIVE
Glucose, UA: NEGATIVE mg/dL
Ketones, ur: 15 mg/dL — AB
LEUKOCYTES UA: NEGATIVE
NITRITE: NEGATIVE
PROTEIN: 30 mg/dL — AB
pH: 5.5 (ref 5.0–8.0)

## 2015-07-30 LAB — I-STAT CG4 LACTIC ACID, ED
LACTIC ACID, VENOUS: 2.67 mmol/L — AB (ref 0.5–2.0)
Lactic Acid, Venous: 0.93 mmol/L (ref 0.5–2.0)

## 2015-07-30 LAB — CBC
HCT: 42.5 % (ref 36.0–46.0)
HEMOGLOBIN: 13.8 g/dL (ref 12.0–15.0)
MCH: 27.6 pg (ref 26.0–34.0)
MCHC: 32.5 g/dL (ref 30.0–36.0)
MCV: 85 fL (ref 78.0–100.0)
Platelets: 191 10*3/uL (ref 150–400)
RBC: 5 MIL/uL (ref 3.87–5.11)
RDW: 14.4 % (ref 11.5–15.5)
WBC: 11.1 10*3/uL — AB (ref 4.0–10.5)

## 2015-07-30 LAB — LIPASE, BLOOD: Lipase: 18 U/L (ref 11–51)

## 2015-07-30 LAB — PROTIME-INR
INR: 1.49 (ref 0.00–1.49)
PROTHROMBIN TIME: 17.5 s — AB (ref 11.6–15.2)

## 2015-07-30 LAB — MAGNESIUM: MAGNESIUM: 1.7 mg/dL (ref 1.7–2.4)

## 2015-07-30 LAB — MRSA PCR SCREENING: MRSA by PCR: NEGATIVE

## 2015-07-30 LAB — TYPE AND SCREEN
ABO/RH(D): O POS
ANTIBODY SCREEN: NEGATIVE

## 2015-07-30 MED ORDER — KETOROLAC TROMETHAMINE 15 MG/ML IJ SOLN
15.0000 mg | Freq: Four times a day (QID) | INTRAMUSCULAR | Status: DC | PRN
  Administered 2015-08-01 – 2015-08-03 (×4): 15 mg via INTRAVENOUS
  Filled 2015-07-30 (×4): qty 1

## 2015-07-30 MED ORDER — PIPERACILLIN-TAZOBACTAM 3.375 G IVPB
3.3750 g | Freq: Three times a day (TID) | INTRAVENOUS | Status: DC
  Administered 2015-07-30 – 2015-08-07 (×23): 3.375 g via INTRAVENOUS
  Filled 2015-07-30 (×23): qty 50

## 2015-07-30 MED ORDER — SODIUM CHLORIDE 0.9 % IV SOLN: INTRAVENOUS | Status: DC

## 2015-07-30 MED ORDER — MORPHINE SULFATE (PF) 4 MG/ML IV SOLN
4.0000 mg | Freq: Once | INTRAVENOUS | Status: AC
  Administered 2015-07-30: 4 mg via INTRAVENOUS
  Filled 2015-07-30: qty 1

## 2015-07-30 MED ORDER — ACETAMINOPHEN 650 MG RE SUPP: 650.0000 mg | Freq: Four times a day (QID) | RECTAL | Status: DC | PRN

## 2015-07-30 MED ORDER — IOHEXOL 300 MG/ML  SOLN
50.0000 mL | Freq: Once | INTRAMUSCULAR | Status: DC | PRN
  Administered 2015-07-30: 50 mL via ORAL
  Administered 2015-08-24: 5 mL via ORAL
  Filled 2015-07-30 (×2): qty 50

## 2015-07-30 MED ORDER — HYDROMORPHONE HCL 1 MG/ML IJ SOLN: 1.0000 mg | INTRAMUSCULAR | Status: DC | PRN

## 2015-07-30 MED ORDER — ACETAMINOPHEN 325 MG PO TABS
650.0000 mg | ORAL_TABLET | Freq: Four times a day (QID) | ORAL | Status: DC | PRN
  Administered 2015-08-01 – 2015-08-03 (×2): 650 mg via ORAL
  Filled 2015-07-30 (×2): qty 2

## 2015-07-30 MED ORDER — ONDANSETRON HCL 4 MG/2ML IJ SOLN
4.0000 mg | Freq: Four times a day (QID) | INTRAMUSCULAR | Status: DC | PRN
  Administered 2015-07-31 – 2015-08-03 (×4): 4 mg via INTRAVENOUS
  Filled 2015-07-30 (×4): qty 2

## 2015-07-30 MED ORDER — PIPERACILLIN-TAZOBACTAM 3.375 G IVPB 30 MIN
3.3750 g | Freq: Once | INTRAVENOUS | Status: AC
  Administered 2015-07-30: 3.375 g via INTRAVENOUS
  Filled 2015-07-30: qty 50

## 2015-07-30 MED ORDER — MAGNESIUM SULFATE 4 GM/100ML IV SOLN
4.0000 g | Freq: Once | INTRAVENOUS | Status: AC
  Administered 2015-07-30: 4 g via INTRAVENOUS
  Filled 2015-07-30: qty 100

## 2015-07-30 MED ORDER — BISACODYL 10 MG RE SUPP: 10.0000 mg | Freq: Every day | RECTAL | Status: DC | PRN

## 2015-07-30 MED ORDER — SODIUM CHLORIDE 0.9 % IV BOLUS (SEPSIS)
1000.0000 mL | Freq: Once | INTRAVENOUS | Status: AC
  Administered 2015-07-30: 1000 mL via INTRAVENOUS

## 2015-07-30 MED ORDER — SODIUM CHLORIDE 0.9 % IV BOLUS (SEPSIS)
2000.0000 mL | Freq: Once | INTRAVENOUS | Status: AC
  Administered 2015-07-30: 2000 mL via INTRAVENOUS

## 2015-07-30 MED ORDER — HYDROCORTISONE 1 % EX CREA
TOPICAL_CREAM | CUTANEOUS | Status: DC | PRN
  Filled 2015-07-30: qty 28

## 2015-07-30 MED ORDER — ONDANSETRON HCL 4 MG PO TABS: 4.0000 mg | ORAL_TABLET | Freq: Four times a day (QID) | ORAL | Status: DC | PRN

## 2015-07-30 MED ORDER — MORPHINE SULFATE (PF) 2 MG/ML IV SOLN
2.0000 mg | INTRAVENOUS | Status: DC | PRN
  Administered 2015-07-30 – 2015-08-03 (×8): 2 mg via INTRAVENOUS
  Filled 2015-07-30 (×8): qty 1

## 2015-07-30 MED ORDER — LORAZEPAM 2 MG/ML IJ SOLN
0.5000 mg | Freq: Four times a day (QID) | INTRAMUSCULAR | Status: DC | PRN
  Administered 2015-08-14: 0.5 mg via INTRAVENOUS
  Administered 2015-08-26: 1 mg via INTRAVENOUS
  Filled 2015-07-30 (×2): qty 1

## 2015-07-30 MED ORDER — SODIUM CHLORIDE 0.9 % IV SOLN
INTRAVENOUS | Status: DC
  Administered 2015-07-30 – 2015-08-01 (×2): via INTRAVENOUS
  Filled 2015-07-30 (×7): qty 1000

## 2015-07-30 MED ORDER — IOHEXOL 300 MG/ML  SOLN
100.0000 mL | Freq: Once | INTRAMUSCULAR | Status: AC | PRN
  Administered 2015-07-30: 100 mL via INTRAVENOUS

## 2015-07-30 MED ORDER — ALBUTEROL SULFATE (2.5 MG/3ML) 0.083% IN NEBU: 2.5000 mg | INHALATION_SOLUTION | RESPIRATORY_TRACT | Status: DC | PRN

## 2015-07-30 MED ORDER — ONDANSETRON 8 MG PO TBDP
8.0000 mg | ORAL_TABLET | Freq: Once | ORAL | Status: AC
  Administered 2015-07-30: 8 mg via ORAL
  Filled 2015-07-30: qty 1

## 2015-07-30 MED ORDER — ONDANSETRON HCL 4 MG/2ML IJ SOLN: 4.0000 mg | Freq: Three times a day (TID) | INTRAMUSCULAR | Status: DC | PRN

## 2015-07-30 MED ORDER — MORPHINE SULFATE (PF) 2 MG/ML IV SOLN
2.0000 mg | INTRAVENOUS | Status: DC | PRN
Start: 2015-07-30 — End: 2015-07-30

## 2015-07-30 MED ORDER — LIDOCAINE-PRILOCAINE 2.5-2.5 % EX CREA: TOPICAL_CREAM | Freq: Once | CUTANEOUS | Status: DC

## 2015-07-30 MED ORDER — HYDROMORPHONE HCL 1 MG/ML IJ SOLN
1.0000 mg | Freq: Once | INTRAMUSCULAR | Status: AC
  Administered 2015-07-30: 1 mg via INTRAVENOUS
  Filled 2015-07-30: qty 1

## 2015-07-30 MED ORDER — SODIUM CHLORIDE 0.9% FLUSH
3.0000 mL | Freq: Two times a day (BID) | INTRAVENOUS | Status: DC
  Administered 2015-07-31 – 2015-08-26 (×24): 3 mL via INTRAVENOUS

## 2015-07-30 MED ORDER — HYDROCORTISONE 2.5 % EX CREA: 1.0000 "application " | TOPICAL_CREAM | CUTANEOUS | Status: DC | PRN

## 2015-07-30 NOTE — ED Notes (Signed)
Stick attempted, sample clotted

## 2015-07-30 NOTE — ED Notes (Signed)
Patient transported to CT 

## 2015-07-30 NOTE — ED Notes (Addendum)
Pt with Hx of endometrial cancer and diverticulosis c/o abdominal pain and sensation of coldness, medium-colored blood in diarrhea, urinary incontinence. Last radiation tx in December. Last chemo was march 2016, pt scheduled for chemo today.

## 2015-07-30 NOTE — ED Notes (Signed)
Patient vomiting, diareah

## 2015-07-30 NOTE — Progress Notes (Signed)
Pharmacy Antibiotic Note  Erin Santiago is a 60 y.o. female admitted on 07/30/2015 with intra-abdominal infection.  PMH signficant for endometrial stromal sarcoma with bilateral pelvic node involvement, diverticulosis, gastric sleeve at [facility] and hysterectomy and oophorectomy in 2016. She is status post chemotherapy and radiation.  Presents with abdominal pain, fevers, N/V/D.  LA elevated.  CT of abdomen and pelvis revealed bowel perforation with a large 9.4cm complex fluid collection in the RLQ, possible erosion of tumor.  Pharmacy has been consulted for Zosyn dosing.  Plan: Zosyn 3.375g IV q8h (4 hour infusion).     Temp (24hrs), Avg:98.4 F (36.9 C), Min:98.4 F (36.9 C), Max:98.4 F (36.9 C)   Recent Labs Lab 07/30/15 1251 07/30/15 1305 07/30/15 1431  WBC 11.1*  --   --   CREATININE 1.06*  --   --   LATICACIDVEN  --  2.67* 0.93    Estimated Creatinine Clearance: 59.4 mL/min (by C-G formula based on Cr of 1.06).    No Known Allergies  Antimicrobials this admission: 2/23 Zosyn >>   Dose adjustments this admission: -  Microbiology results: 2/23 BCx: collected 2/23 UCx: collected   Thank you for allowing pharmacy to be a part of this patient's care.  Hershal Coria 07/30/2015 5:56 PM

## 2015-07-30 NOTE — Consult Note (Signed)
Reason for Consult: bowel perforation Referring Physician: Dr. 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 in 2016.  She is status post chemotherapy and radiation to the pelvis and back.  She was due to start chemotherapy today due to progression of disease involcing left common iliac adenopathy, 2 masses in the RLQ seen on CT scan on 07/21/15.  She presents today with abdominal pain.  First developed suddenly at [place] on Wednesday night.  It was more epigastric, radiated to the lower abdomen.  The pain progressively worsened.  Then developed fevers, chills, sweats, nausea and vomiting.  She also developed diarrhea today and hematochezia.  Last oral intake was this morning, consisted of pudding which she threw up.  ED work up revealed normal renal function, lactic acid of 2.67, WBC of 11.1k.  CT of abdomen and pelvis revealed bowel perforation with a large 9.4cm complex fluid collection in the RLQ, possible erosion of tumor.  We have therefore been asked to evaluate.   Past Medical History  Diagnosis Date  . Abnormal uterine bleeding   . Anemia   . Fibroid   . Heart murmur     under 5 yrs of age  . Cancer ([place]) dx'd 07/2014    endometrial stromal sarcoma  . Radiation 12/01/14-01/05/15    pelvis 45 gray  . Radiation 05/18/2015-06/05/15    T4 thoracic spine area 35 gray    Past Surgical History  Procedure Laterality Date  . Laparoscopic gastric sleeve resection  2011  . Abdominal hysterectomy  07/2014    at [facility] History  Problem Relation Age of Onset  . Hypertension Mother   . Diabetes Mother   . Thyroid disease Father   . Heart attack Father   . Other Father     enlarged heart  . Non-Hodgkin's lymphoma Mother 73    Social History:  reports that she has never smoked. She has never  used smokeless tobacco. She reports that she does not drink alcohol or use illicit drugs.  Allergies: No Known Allergies  Medications:  Scheduled Meds:  Continuous Infusions: . piperacillin-tazobactam 3.375 g (07/30/15 1615)   PRN Meds:.iohexol   Results for orders placed or performed during the hospital encounter of 07/30/15 (from the past 48 hour(s))  Lipase, blood     Status: None   Collection Time: 07/30/15 12:51 PM  Result Value Ref Range   Lipase 18 11 - 51 U/L  Comprehensive metabolic panel     Status: Abnormal   Collection Time: 07/30/15 12:51 PM  Result Value Ref Range   Sodium 133 (L) 135 - 145 mmol/L   Potassium 3.5 3.5 - 5.1 mmol/L   Chloride 102 101 - 111 mmol/L   CO2 19 (L) 22 - 32 mmol/L   Glucose, Bld 125 (H) 65 - 99 mg/dL   BUN 29 (H) 6 - 20 mg/dL   Creatinine, Ser 1.06 (H) 0.44 - 1.00 mg/dL   Calcium 8.5 (L) 8.9 - 10.3 mg/dL   Total Protein 7.5 6.5 - 8.1 g/dL   Albumin 3.9 3.5 - 5.0 g/dL   AST 23 15 - 41 U/L   ALT 13 (L) 14 - 54 U/L   Alkaline Phosphatase 60 38 - 126 U/L   Total Bilirubin 3.4 (H) 0.3 - 1.2 mg/dL   GFR calc  non Af Amer 56 (L) >60 mL/min   GFR calc Af Amer >60 >60 mL/min    Comment: (NOTE) The eGFR has been calculated using the CKD EPI equation. This calculation has not been validated in all clinical situations. eGFR's persistently <60 mL/min signify possible Chronic Kidney Disease.    Anion gap 12 5 - 15  CBC     Status: Abnormal   Collection Time: 07/30/15 12:51 PM  Result Value Ref Range   WBC 11.1 (H) 4.0 - 10.5 K/uL   RBC 5.00 3.87 - 5.11 MIL/uL   Hemoglobin 13.8 12.0 - 15.0 g/dL   HCT 92.1 78.3 - 75.4 %   MCV 85.0 78.0 - 100.0 fL   MCH 27.6 26.0 - 34.0 pg   MCHC 32.5 30.0 - 36.0 g/dL   RDW 23.7 02.3 - 01.7 %   Platelets 191 150 - 400 K/uL  Type and screen [facility]     Status: None   Collection Time: 07/30/15 12:51 PM  Result Value Ref Range   ABO/RH(D) O POS    Antibody Screen NEG    Sample  Expiration 08/02/2015   I-Stat CG4 Lactic Acid, ED     Status: Abnormal   Collection Time: 07/30/15  1:05 PM  Result Value Ref Range   Lactic Acid, Venous 2.67 (HH) 0.5 - 2.0 mmol/L   Comment NOTIFIED PHYSICIAN   I-Stat CG4 Lactic Acid, ED     Status: None   Collection Time: 07/30/15  2:31 PM  Result Value Ref Range   Lactic Acid, Venous 0.93 0.5 - 2.0 mmol/L    Ct Abdomen Pelvis W Contrast  07/30/2015  CLINICAL DATA:  Acute onset of abdominal pain today. Diarrhea. History of metastatic endometrial carcinoma. EXAM: CT ABDOMEN AND PELVIS WITH CONTRAST TECHNIQUE: Multidetector CT imaging of the abdomen and pelvis was performed using the standard protocol following bolus administration of intravenous contrast. CONTRAST:  OMNIPAQUE IOHEXOL 300 MG/ML  SOLN COMPARISON:  07/21/2015. FINDINGS: Lower chest: Significant bibasilar atelectasis. No pleural effusion. No pericardial effusion. The distal esophagus is grossly normal. 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 Hepatobiliary: No focal hepatic lesions or intrahepatic biliary dilatation. The gallbladder is normal. No common bile duct dilatation. Pancreas: No mass, inflammation or ductal dilatation. Spleen: Normal size.  No focal lesions. Adrenals/Urinary Tract: The adrenal glands and kidneys are unremarkable and stable. Stomach/Bowel: Surgical changes involving the stomach with gastric sleeve procedure. The duodenum is unremarkable. The proximal and mid small bowel is unremarkable. There is moderate inflammation in falling the terminal and distal ileum. On the prior CT scan there was a 5 cm "Mass" near the cecum. This now contains air and contrast suggesting that there has been erosion into the small bowel or a walled-off small bowel perforation. In retrospect I am not sure that at the pericecal lesion was actually nodal disease and may have been a complex fluid collection at that point that has now fistualized. Also, if this was a solid  mass that had necrosis and eroded into the small bowel I would not expect to see air throughout the entire area. Either way, this is a bowel perforation and abscess and need to be drained. There is also moderate fluid in the small amount of air in the pelvis which may communicate. Vascular/Lymphatic: The nodal disease in the abdomen/pelvis appears relatively stable since 2 weeks ago. The periduodenal lesion on image 30 measures 12 mm. Retroperitoneal lesion on image 45 measures 37.5 mm. The left-sided  retroperitoneal nodal lesion on image number 52 measures 21.5 mm. Left pelvic nodal lesion on image number 72 measures 20.5 mm. Other: Status post hysterectomy.  No inguinal adenopathy. Musculoskeletal: No significant bony findings. IMPRESSION: CT findings consistent with a bowel perforation. There is a large (9 x 4.5 cm) complex right lower quadrant and pelvic abscess containing a small amount of contrast suggesting communication with the distal ileum. This could be due to tumor erosion as discussed above. Stable adenopathy in the abdomen/pelvis. These results were called by telephone at the time of interpretation on 07/30/2015 at 3:41 pm to Dr. Margarita Mail , who verbally acknowledged these results. Electronically Signed   By: Marijo Sanes M.D.   On: 07/30/2015 15:41   Dg Abd Acute W/chest  07/30/2015  CLINICAL DATA:  Shortness of breath today, history endometrial cancer, diverticulosis, abdominal pain, cold sensation, blood in diarrhea, urinary incontinence, post chemotherapy and radiation therapy EXAM: DG ABDOMEN ACUTE W/ 1V CHEST COMPARISON:  Chest radiograph 11/10/2014, CT chest abdomen and pelvis 07/21/2015 FINDINGS: RIGHT jugular Port-A-Cath with tip projecting over SVC above cavoatrial junction. Minimal enlargement of cardiac silhouette. Mild tortuosity of thoracic aorta. Mediastinal contours and pulmonary vascularity otherwise normal. Bibasilar atelectasis. Lungs otherwise clear. No pleural effusion or  pneumothorax. Retained contrast within a few small bowel loops in the mid abdomen which appear prominent, raising question of small bowel obstruction. These small bowel loops appear more prominent than were seen on the recent CT. No definite free intraperitoneal air. Prior gastric surgery. Bones demineralized. IMPRESSION: Dilated small bowel loops containing residual contrast in the mid abdomen cold most likely representing a component of small bowel obstruction. Bibasilar atelectasis. Electronically Signed   By: Lavonia Dana M.D.   On: 07/30/2015 13:16    Review of Systems  Constitutional: Positive for fever, chills, malaise/fatigue and diaphoresis. Negative for weight loss.  Eyes: Negative for blurred vision, double vision, photophobia, pain, discharge and redness.  Respiratory: Positive for shortness of breath. Negative for cough, hemoptysis, sputum production and wheezing.   Cardiovascular: Negative for chest pain, palpitations, orthopnea, claudication, leg swelling and PND.  Gastrointestinal: Positive for nausea, vomiting, abdominal pain, diarrhea and blood in stool. Negative for constipation.  Genitourinary: Negative for dysuria, urgency, frequency, hematuria and flank pain.       Incontinence   Neurological: Negative for dizziness, tingling, tremors, sensory change, speech change, focal weakness, seizures, loss of consciousness, weakness and headaches.   Blood pressure 122/84, pulse 114, temperature 98.4 F (36.9 C), temperature source Oral, resp. rate 18, last menstrual period 02/04/2014, SpO2 90 %. Physical Exam  Constitutional: She is oriented to person, place, and time. She appears well-developed and well-nourished. No distress.  Cardiovascular: Normal rate, regular rhythm, normal heart sounds and intact distal pulses.  Exam reveals no gallop and no friction rub.   No murmur heard. Respiratory: Effort normal and breath sounds normal. No respiratory distress. She has no wheezes. She has  no rales. She exhibits no tenderness.  GI:  +BS, abdomen is soft, but diffusely tender with voluntary guarding.   Musculoskeletal: Normal range of motion. She exhibits no edema or tenderness.  Neurological: She is alert and oriented to person, place, and time.  Skin: Skin is warm and dry. No rash noted. She is not diaphoretic. No erythema. No pallor.  Psychiatric: She has a normal mood and affect. Her behavior is normal. Judgment and thought content normal.    Assessment/Plan: 60 year old female with a history of endometrial stromal sarcoma with bilateral  pelvic node involvement, diverticulosis, gastric sleeve at [facility] and hysterectomy and oophorectomy at [facility] in 2016.  Bowel perforation Complex 9x4cm fluid collection Will ask if IR can percutaneously drain the fluid collection.  Recommend medical admission, step down admission to watch closely for developing peritonitis. Start Zosyn, keep NPO, IV hydration and pain control.  She understands should she develop over symptoms of peritonitis, she may require a laparotomy, colostomy.  She would be a high risk surgical candidate given advanced disease, pelvic radiation and previous surgeries.  Will follow along closely.   Fanny Agan ANP-BC 07/30/2015, 4:22 PM

## 2015-07-30 NOTE — Telephone Encounter (Signed)
Erin Santiago call to let Dr. Marko Plume know that she was going to go to the ED. The abdominal pain and diarrhea is not from eating 3 doughnuts the day before with her gastric banding as she thought yesterday when she call the office to cancel chemotherapy. The pain is better than yesterday but she thinks it is not from the consumption of the doughnuts.  She is going to the ED as instructed yesterday if the abdominal pin got worse or not improved.

## 2015-07-30 NOTE — ED Provider Notes (Signed)
CSN: [id number]     Arrival date & time 07/30/15  1024 History   First MD Initiated Contact with Patient 07/30/15 1130     Chief Complaint  Patient presents with  . GI Bleeding  . Cancer     (Consider location/radiation/quality/duration/timing/severity/associated sxs/prior Treatment) HPI   Erin Santiago Is a 60-year-old female who presents to the emergency department with chief complaint of abdominal pain, nausea, vomiting and diarrhea. She has a history of this endometrial stromal sarcoma. She currently has a recurrence of the cancer in her abdomen and was scheduled for her first dose of chemotherapy and immune therapy today. Patient states that yesterday she developed diffuse abdominal pain and had multiple episodes of watery diarrhea. Patient states that she slept most of the day. Today when she woke, she continued to have diarrhea and vomiting. She states she is extremely dehydrated, has very little saliva. She complains of diffuse and severe abdominal pain. She did not have abdominal pain prior to today. She denies fevers. She has no previous abdominal surgeries except for a hysterectomy.  Past Medical History  Diagnosis Date  . Abnormal uterine bleeding   . Anemia   . Fibroid   . Heart murmur     under 5 yrs of age  . Cancer ([place]) dx'd 07/2014    endometrial stromal sarcoma  . Radiation 12/01/14-01/05/15    pelvis 45 gray  . Radiation 05/18/2015-06/05/15    T4 thoracic spine area 35 gray   Past Surgical History  Procedure Laterality Date  . Laparoscopic gastric sleeve resection    . Abdominal hysterectomy  07/2014    at [facility] History  Problem Relation Age of Onset  . Hypertension Mother   . Diabetes Mother   . Thyroid disease Father   . Heart attack Father   . Other Father     enlarged heart  . Non-Hodgkin's lymphoma Mother 27   Social History  Substance Use Topics  . Smoking status: Never Smoker   . Smokeless tobacco: Never Used  . Alcohol Use: No   OB  History    Gravida Para Term Preterm AB TAB SAB Ectopic Multiple Living   0 0 0 0 0 0 0 0 0 0      Review of Systems  Ten systems reviewed and are negative for acute change, except as noted in the HPI.    Allergies  Review of patient's allergies indicates no known allergies.  Home Medications   Prior to Admission medications   Medication Sig Start Date End Date Taking? Authorizing Provider  calcium carbonate (TUMS - DOSED IN MG ELEMENTAL CALCIUM) 500 MG chewable tablet Chew 1 tablet by mouth daily.    Historical Provider, MD  dexamethasone (DECADRON) 4 MG tablet Take 2 tablets by mouth once a day on the day after chemotherapy and then take 2 tablets two times a day for 2 days. Take with food. 07/27/15   Lennis Marion Downer, MD  docusate sodium (COLACE) 100 MG capsule Take 100 mg by mouth 2 (two) times daily. Reported on 05/28/2015 07/17/14   Historical Provider, MD  ferrous fumarate (HEMOCYTE) 325 (106 FE) MG TABS tablet Take 1 tab daily on an empty stomach with OJ or Vitamin C 500 mg Patient not taking: Reported on 07/09/2015 09/15/14   Gordy Levan, MD  hydrocortisone 2.5 % cream Apply 1 application topically as needed (itch/psoriasis.). Reported on 07/17/2015 05/19/14   Historical Provider, MD  lidocaine-prilocaine (EMLA) cream Apply to Porta-cath 1-2 hrs  prior to access. Cover with [employer]. 08/18/14   Lennis Marion Downer, MD  loperamide (IMODIUM) 2 MG capsule Take 2-4 mg by mouth 4 (four) times daily as needed for diarrhea or loose stools. Reported on 07/09/2015    Historical Provider, MD  LORazepam (ATIVAN) 1 MG tablet Place 1/2 to 1 tablet under the tongue or swallow every 6 hours as needed for nausea 07/22/15   Lennis Marion Downer, MD  meloxicam (MOBIC) 15 MG tablet Take 15 mg by mouth daily.  06/09/14   Historical Provider, MD  ondansetron (ZOFRAN) 8 MG tablet Take 1 tablet (8 mg total) by mouth every 8 (eight) hours as needed for nausea or vomiting. 07/22/15   Lennis Marion Downer, MD   prochlorperazine (COMPAZINE) 10 MG tablet Take 1 tablet (10 mg total) by mouth every 6 (six) hours as needed (Nausea or vomiting). 07/27/15   Lennis Marion Downer, MD  Psyllium 28.3 % POWD Take 1 packet by mouth daily as needed. Reported on 05/26/2015    Historical Provider, MD  Resveratrol 250 MG CAPS Take 250 mg by mouth 2 (two) times daily. Reported on 05/26/2015    Historical Provider, MD  sodium chloride (OCEAN) 0.65 % SOLN nasal spray Place 1 spray into both nostrils as needed for congestion. Patient not taking: Reported on 07/17/2015 09/12/14   Bonnielee Haff, MD  sucralfate (CARAFATE) 1 GM/10ML suspension Take 10 mLs (1 g total) by mouth 4 (four) times daily -  with meals and at bedtime. Patient not taking: Reported on 07/09/2015 05/20/15   [name], MD  traMADol Veatrice Bourbon) 50 MG tablet May take 25-50 mg PO BID PRN. (1/2 to 1 whole tab) Patient not taking: Reported on 07/09/2015 09/08/14   Susanne Borders, NP   BP 142/95 mmHg  Pulse 103  Temp(Src) 98.4 F (36.9 C) (Oral)  Resp 20  SpO2 96%  LMP 02/04/2014 Physical Exam  Constitutional: She is oriented to person, place, and time. She appears well-developed and well-nourished. No distress.  HENT:  Head: Normocephalic and atraumatic.  Eyes: Conjunctivae are normal. No scleral icterus.  Neck: Normal range of motion.  Cardiovascular: Normal rate, regular rhythm and normal heart sounds.  Exam reveals no gallop and no friction rub.   No murmur heard. Pulmonary/Chest: Breath sounds normal. No respiratory distress.  Shallow effort   Abdominal: Soft. There is tenderness. There is guarding.  Patient with exquisite tenderness. +peritoneal signs pain with any movement or breathing. +acute abdominal exam  Neurological: She is alert and oriented to person, place, and time.  Skin: Skin is warm and dry. She is not diaphoretic.  Nursing note and vitals reviewed.   ED Course  .Critical Care Performed by: Margarita Mail Authorized by: Margarita Mail Total critical care time: 60 minutes Critical care time was exclusive of separately billable procedures and treating other patients and teaching time. Critical care was necessary to treat or prevent imminent or life-threatening deterioration of the following conditions: acute abdomen, perforated bowel and intraabdominal abscess. Critical care was time spent personally by me on the following activities: development of treatment plan with patient or surrogate, discussions with consultants, interpretation of cardiac output measurements, evaluation of patient's response to treatment, examination of patient, obtaining history from patient or surrogate, ordering and performing treatments and interventions, ordering and review of laboratory studies, ordering and review of radiographic studies, pulse oximetry, re-evaluation of patient's condition and review of old charts.   (including critical care time) Labs Review Labs Reviewed  COMPREHENSIVE METABOLIC PANEL -  Abnormal; Notable for the following:    Sodium 133 (*)    CO2 19 (*)    Glucose, Bld 125 (*)    BUN 29 (*)    Creatinine, Ser 1.06 (*)    Calcium 8.5 (*)    ALT 13 (*)    Total Bilirubin 3.4 (*)    GFR calc non Af Amer 56 (*)    All other components within normal limits  CBC - Abnormal; Notable for the following:    WBC 11.1 (*)    All other components within normal limits  I-STAT CG4 LACTIC ACID, ED - Abnormal; Notable for the following:    Lactic Acid, Venous 2.67 (*)    All other components within normal limits  CULTURE, BLOOD (ROUTINE X 2)  CULTURE, BLOOD (ROUTINE X 2)  LIPASE, BLOOD  URINALYSIS, ROUTINE W REFLEX MICROSCOPIC (NOT AT [facility])  I-STAT CG4 LACTIC ACID, ED  POC OCCULT BLOOD, ED  TYPE AND SCREEN    Imaging Review No results found. I have personally reviewed and evaluated these images and lab results as part of my medical decision-making.   EKG Interpretation None      MDM   Final diagnoses:  Abscess  of abdominal cavity (HCC)  Bowel perforation (HCC)    Patient with lactic acidosis, resolved with fluids. She has  Leukocytosis. She does not fit Sepsis criteria. Her CT shows a large  Abscess with bowel perforation. I have ordered Zosyn Surgery has consulted and will send the patient to IR for drain placement. I have consulted with Dr. Grandville Silos of [facility] who will admit the patient to stepdown. She is stable here in the ED.     Margarita Mail, PA-C 07/30/15 [place], MD 07/31/15 [phone]

## 2015-07-30 NOTE — H&P (Signed)
[facility] History and Physical  Erin Santiago [id number] DOB: 1955/08/24 DOA: 07/30/2015  Referring physician: Margarita Mail, PA PCP: Merrilee Seashore, MD   Chief Complaint: Abdominal pain  HPI: Erin Santiago is a 60 y.o. 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 Patient is status post chemotherapy and radiation to the pelvis and back and was due to start chemotherapy on the day of admission due to progression of disease involving the left abdominal pain. Patient had presented to the ED with complaints of 1-2 day history of diffuse abdominal pain. Patient stated that she woke up around 2 AM one day prior to admission with significant diffuse abdominal pain and when she stood up to go through her bathroom she lost control of her bladder due to probable incontinence. Patient stated that the whole day prior to admission was lethargic and very sleepy and slept all day. Patient does endorse subjective fevers, chills, nausea, emesis, generalized weakness, diarrhea. Patient also endorses some hematochezia. Patient denies chest pain, no constipation, no dysuria, no hematemesis, no melena. Patient does endorse an episode of hematemesis. Patient denies any cough. Patient was seen in the emergency room comprehensive metabolic profile done at a sodium of 133 bicarbonate of 19 BUN of 29 creatinine of 1.06 glucose of 125 AST of 13 ALT of 23 protein of 7.5 bilirubin of 3.4 otherwise is within normal limits. Initial lactic acid level is elevated at 2.67 and repeat lactic acid level was down to 0.93. CBC had a white count of 11.1 with no other abnormalities. Urinalysis pending. Acute abdominal series showed dilated small bowel loops containing residual contrast in the midabdomen most likely represent a component of small bowel obstruction. Bibasilar  atelectasis. CT abdomen and pelvis did show bowel perforation. Large 9 x 4.5 cm complex right lower quadrant and pelvic abscess containing a small amount of contrast suggesting communication with the distal ileum. This could be due to tumor erosion. Stable adenopathy in the abdomen and pelvis. Patient was seen in consultation by general surgery who recommended initially nonoperative percutaneous drainage and admission per hospitalist.    Review of Systems: As per history of present illness otherwise negative. Constitutional:  No weight loss, night sweats, Fevers, chills, fatigue.  HEENT:  No headaches, Difficulty swallowing,Tooth/dental problems,Sore throat,  No sneezing, itching, ear ache, nasal congestion, post nasal drip,  Cardio-vascular:  No chest pain, Orthopnea, PND, swelling in lower extremities, anasarca, dizziness, palpitations  GI:  No heartburn, indigestion, abdominal pain, nausea, vomiting, diarrhea, change in bowel habits, loss of appetite  Resp:  No shortness of breath with exertion or at rest. No excess mucus, no productive cough, No non-productive cough, No coughing up of blood.No change in color of mucus.No wheezing.No chest wall deformity  Skin:  no rash or lesions.  GU:  no dysuria, change in color of urine, no urgency or frequency. No flank pain.  Musculoskeletal:  No joint pain or swelling. No decreased range of motion. No back pain.  Psych:  No change in mood or affect. No depression or anxiety. No memory loss.   Past Medical History  Diagnosis Date  . Abnormal uterine bleeding   . Anemia   . Fibroid   . Heart murmur     under 5 yrs of age  . Cancer ([place]) dx'd 07/2014    endometrial stromal sarcoma  . Radiation 12/01/14-01/05/15    pelvis 45 gray  .  Radiation 05/18/2015-06/05/15    T4 thoracic spine area 35 gray   Past Surgical History  Procedure Laterality Date  . Laparoscopic gastric sleeve resection  2011  . Abdominal hysterectomy  07/2014    at [place] History:  reports that she has never smoked. She has never used smokeless tobacco. She reports that she does not drink alcohol or use illicit drugs.  No Known Allergies  Family History  Problem Relation Age of Onset  . Hypertension Mother   . Diabetes Mother   . Thyroid disease Father   . Heart attack Father   . Other Father     enlarged heart  . Non-Hodgkin's lymphoma Mother 40   father deceased age 34 from coronary artery disease. Mother deceased age 68 beats non-Hodgkin's lymphoma and also noted to have a stage IV decubitus ulcer.  Prior to Admission medications   Medication Sig Start Date End Date Taking? Authorizing Provider  calcium carbonate (TUMS - DOSED IN MG ELEMENTAL CALCIUM) 500 MG chewable tablet Chew 2 tablets by mouth daily.    Yes Historical Provider, MD  dexamethasone (DECADRON) 4 MG tablet Take 2 tablets by mouth once a day on the day after chemotherapy and then take 2 tablets two times a day for 2 days. Take with food. 07/27/15  Yes Lennis Marion Downer, MD  docusate sodium (COLACE) 100 MG capsule Take 100 mg by mouth 2 (two) times daily as needed for mild constipation. Reported on 05/28/2015 07/17/14  Yes Historical Provider, MD  hydrocortisone 2.5 % cream Apply 1 application topically as needed (itch/psoriasis.). Reported on 07/17/2015 05/19/14  Yes Historical Provider, MD  lidocaine-prilocaine (EMLA) cream Apply to Porta-cath 1-2 hrs prior to access. Cover with [employer]. 08/18/14  Yes Lennis Marion Downer, MD  LORazepam (ATIVAN) 1 MG tablet Place 1/2 to 1 tablet under the tongue or swallow every 6 hours as needed for nausea 07/22/15  Yes Lennis P Livesay, MD  meloxicam (MOBIC) 15 MG tablet Take 15 mg by mouth daily.  06/09/14  Yes Historical Provider, MD  ondansetron (ZOFRAN) 8 MG tablet Take 1 tablet (8 mg total) by mouth every 8 (eight) hours as needed for nausea or vomiting. 07/22/15  Yes Lennis Marion Downer, MD  prochlorperazine (COMPAZINE) 10 MG tablet Take 1 tablet (10  mg total) by mouth every 6 (six) hours as needed (Nausea or vomiting). 07/27/15  Yes Lennis Marion Downer, MD  Psyllium 28.3 % POWD Take 1 packet by mouth daily as needed (for fiber). Reported on 05/26/2015   Yes Historical Provider, MD  Resveratrol 250 MG CAPS Take 250 mg by mouth 2 (two) times daily. Reported on 05/26/2015   Yes Historical Provider, MD  ferrous fumarate (HEMOCYTE) 325 (106 FE) MG TABS tablet Take 1 tab daily on an empty stomach with OJ or Vitamin C 500 mg Patient not taking: Reported on 07/09/2015 09/15/14   Lennis Marion Downer, MD  sucralfate (CARAFATE) 1 GM/10ML suspension Take 10 mLs (1 g total) by mouth 4 (four) times daily -  with meals and at bedtime. Patient not taking: Reported on 07/30/2015 05/20/15   [name], MD   Physical Exam: Filed Vitals:   07/30/15 1309 07/30/15 1540 07/30/15 1600 07/30/15 1717  BP: 118/65 114/70 122/84 108/71  Pulse: 98 114  80  Temp:      TempSrc:      Resp: 17 18  16   SpO2: 92% 90%  92%    Wt Readings from Last 3 Encounters:  07/17/15 98.612 kg (217 lb 6.4 oz)  07/09/15 97.932 kg (215 lb 14.4 oz)  06/25/15 98.476 kg (217 lb 1.6 oz)    General:  Appears calm and comfortable, laying on gurney in no acute cardiopulmonary distress. Eyes: PERRLA, EOMI, normal lids, irises & conjunctiva ENT: grossly normal hearing, lips & tongue. Dry mucous membranes. Neck: no LAD, masses or thyromegaly Cardiovascular: RRR, no m/r/g. No LE edema. Telemetry: SR, no arrhythmias  Respiratory: CTA bilaterally, no w/r/r. Normal respiratory effort. Abdomen: soft, hypoactive bowel sounds, diffuse tenderness to palpation, mildly distended. Skin: no rash or induration seen on limited exam Musculoskeletal: grossly normal tone BUE/BLE Psychiatric: grossly normal mood and affect, speech fluent and appropriate Neurologic: Alert and oriented 3. CN 2 through 12 grossly intact. No focal deficits. grossly non-focal.          Labs on Admission:  Basic Metabolic  Panel:  Recent Labs Lab 07/30/15 1251  NA 133*  K 3.5  CL 102  CO2 19*  GLUCOSE 125*  BUN 29*  CREATININE 1.06*  CALCIUM 8.5*   Liver Function Tests:  Recent Labs Lab 07/30/15 1251  AST 23  ALT 13*  ALKPHOS 60  BILITOT 3.4*  PROT 7.5  ALBUMIN 3.9    Recent Labs Lab 07/30/15 1251  LIPASE 18   No results for input(s): AMMONIA in the last 168 hours. CBC:  Recent Labs Lab 07/30/15 1251  WBC 11.1*  HGB 13.8  HCT 42.5  MCV 85.0  PLT 191   Cardiac Enzymes: No results for input(s): CKTOTAL, CKMB, CKMBINDEX, TROPONINI in the last 168 hours.  BNP (last 3 results) No results for input(s): BNP in the last 8760 hours.  ProBNP (last 3 results) No results for input(s): PROBNP in the last 8760 hours.  CBG: No results for input(s): GLUCAP in the last 168 hours.  Radiological Exams on Admission: Ct Abdomen Pelvis W Contrast  07/30/2015  CLINICAL DATA:  Acute onset of abdominal pain today. Diarrhea. History of metastatic endometrial carcinoma. EXAM: CT ABDOMEN AND PELVIS WITH CONTRAST TECHNIQUE: Multidetector CT imaging of the abdomen and pelvis was performed using the standard protocol following bolus administration of intravenous contrast. CONTRAST:  172mL OMNIPAQUE IOHEXOL 300 MG/ML  SOLN COMPARISON:  07/21/2015. FINDINGS: Lower chest: Significant bibasilar atelectasis. No pleural effusion. No pericardial effusion. The distal esophagus is grossly normal. 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 Hepatobiliary: No focal hepatic lesions or intrahepatic biliary dilatation. The gallbladder is normal. No common bile duct dilatation. Pancreas: No mass, inflammation or ductal dilatation. Spleen: Normal size.  No focal lesions. Adrenals/Urinary Tract: The adrenal glands and kidneys are unremarkable and stable. Stomach/Bowel: Surgical changes involving the stomach with gastric sleeve procedure. The duodenum is unremarkable. The proximal and mid small bowel is  unremarkable. There is moderate inflammation in falling the terminal and distal ileum. On the prior CT scan there was a 5 cm "Mass" near the cecum. This now contains air and contrast suggesting that there has been erosion into the small bowel or a walled-off small bowel perforation. In retrospect I am not sure that at the pericecal lesion was actually nodal disease and may have been a complex fluid collection at that point that has now fistualized. Also, if this was a solid mass that had necrosis and eroded into the small bowel I would not expect to see air throughout the entire area. Either way, this is a bowel perforation and abscess and need to be drained. There is also moderate fluid in the  small amount of air in the pelvis which may communicate. Vascular/Lymphatic: The nodal disease in the abdomen/pelvis appears relatively stable since 2 weeks ago. The periduodenal lesion on image 30 measures 12 mm. Retroperitoneal lesion on image 45 measures 37.5 mm. The left-sided retroperitoneal nodal lesion on image number 52 measures 21.5 mm. Left pelvic nodal lesion on image number 72 measures 20.5 mm. Other: Status post hysterectomy.  No inguinal adenopathy. Musculoskeletal: No significant bony findings. IMPRESSION: CT findings consistent with a bowel perforation. There is a large (9 x 4.5 cm) complex right lower quadrant and pelvic abscess containing a small amount of contrast suggesting communication with the distal ileum. This could be due to tumor erosion as discussed above. Stable adenopathy in the abdomen/pelvis. These results were called by telephone at the time of interpretation on 07/30/2015 at 3:41 pm to Dr. Margarita Mail , who verbally acknowledged these results. Electronically Signed   By: Marijo Sanes M.D.   On: 07/30/2015 15:41   Dg Abd Acute W/chest  07/30/2015  CLINICAL DATA:  Shortness of breath today, history endometrial cancer, diverticulosis, abdominal pain, cold sensation, blood in diarrhea,  urinary incontinence, post chemotherapy and radiation therapy EXAM: DG ABDOMEN ACUTE W/ 1V CHEST COMPARISON:  Chest radiograph 11/10/2014, CT chest abdomen and pelvis 07/21/2015 FINDINGS: RIGHT jugular Port-A-Cath with tip projecting over SVC above cavoatrial junction. Minimal enlargement of cardiac silhouette. Mild tortuosity of thoracic aorta. Mediastinal contours and pulmonary vascularity otherwise normal. Bibasilar atelectasis. Lungs otherwise clear. No pleural effusion or pneumothorax. Retained contrast within a few small bowel loops in the mid abdomen which appear prominent, raising question of small bowel obstruction. These small bowel loops appear more prominent than were seen on the recent CT. No definite free intraperitoneal air. Prior gastric surgery. Bones demineralized. IMPRESSION: Dilated small bowel loops containing residual contrast in the mid abdomen cold most likely representing a component of small bowel obstruction. Bibasilar atelectasis. Electronically Signed   By: Lavonia Dana M.D.   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 CT scan does like perforation may be contained a walled off in the right lower quadrant. Check blood cultures 2. Keep nothing by mouth. Please empirically on IV Zosyn. Patient has been seen in consultation by general surgery was recommended and initially nonoperative percutaneous drainage placement. Interventional radiology tomorrow morning. If patient shows any signs of deterioration patient will likely require emergency laparotomy.  Continue IV fluids, supportive care. General surgery following and appreciate the input recommendations.  #2 leukocytosis Likely secondary to problem #1. Check blood cultures 2. Placed empirically on IV Zosyn. Follow.  #3 history of iron deficiency anemia Follow H&H.  #4 dehydration IV fluids.  #5 lactic acidosis Likely secondary to problem #1. Repeat lactic acid level has improved.  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 Patient status post chemotherapy and radiation to the pelvis. Patient was to start chemotherapy on the day of admission due to progression of disease involving the left, iliac adenopathy, 2  masses in the right lower quadrant seen on CT scan on 07/21/2015. Will inform oncology, Dr. Marko Plume of patient's admission.  #7 prophylaxis SCDs for DVT prophylaxis.  Code Status: Full DVT Prophylaxis: SCDs. Family Communication: Updated patient. No family at bedside. Disposition Plan: Admit to SDU.  Time spent: [facility] Pager [phone]

## 2015-07-30 NOTE — Telephone Encounter (Signed)
R/s appt per 2/23 pof

## 2015-07-30 NOTE — ED Notes (Signed)
Pt cannot use restroom at this time, aware urine specimen is needed.  

## 2015-07-31 ENCOUNTER — Encounter (HOSPITAL_COMMUNITY): Payer: Self-pay | Admitting: Radiology

## 2015-07-31 ENCOUNTER — Inpatient Hospital Stay (HOSPITAL_COMMUNITY): Payer: Federal, State, Local not specified - PPO

## 2015-07-31 ENCOUNTER — Telehealth: Payer: Self-pay

## 2015-07-31 ENCOUNTER — Ambulatory Visit: Payer: Federal, State, Local not specified - PPO

## 2015-07-31 DIAGNOSIS — K631 Perforation of intestine (nontraumatic): Principal | ICD-10-CM

## 2015-07-31 DIAGNOSIS — C541 Malignant neoplasm of endometrium: Secondary | ICD-10-CM

## 2015-07-31 DIAGNOSIS — K630 Abscess of intestine: Secondary | ICD-10-CM

## 2015-07-31 LAB — COMPREHENSIVE METABOLIC PANEL
ALT: 11 U/L — ABNORMAL LOW (ref 14–54)
AST: 16 U/L (ref 15–41)
Albumin: 2.7 g/dL — ABNORMAL LOW (ref 3.5–5.0)
Alkaline Phosphatase: 48 U/L (ref 38–126)
Anion gap: 8 (ref 5–15)
BILIRUBIN TOTAL: 2.4 mg/dL — AB (ref 0.3–1.2)
BUN: 16 mg/dL (ref 6–20)
CALCIUM: 7.6 mg/dL — AB (ref 8.9–10.3)
CO2: 22 mmol/L (ref 22–32)
Chloride: 106 mmol/L (ref 101–111)
Creatinine, Ser: 0.72 mg/dL (ref 0.44–1.00)
GFR calc Af Amer: >60 mL/min (ref >60)
GLUCOSE: 96 mg/dL (ref 65–99)
Potassium: 3.3 mmol/L — ABNORMAL LOW (ref 3.5–5.1)
Sodium: 136 mmol/L (ref 135–145)
TOTAL PROTEIN: 5.6 g/dL — AB (ref 6.5–8.1)

## 2015-07-31 LAB — URINE CULTURE: Culture: 3000

## 2015-07-31 LAB — MAGNESIUM: Magnesium: 2.5 mg/dL — ABNORMAL HIGH (ref 1.7–2.4)

## 2015-07-31 LAB — POTASSIUM: Potassium: 4.1 mmol/L (ref 3.5–5.1)

## 2015-07-31 LAB — APTT: aPTT: 32 seconds (ref 24–37)

## 2015-07-31 LAB — LACTIC ACID, PLASMA: LACTIC ACID, VENOUS: 0.7 mmol/L (ref 0.5–2.0)

## 2015-07-31 LAB — OCCULT BLOOD X 1 CARD TO LAB, STOOL: FECAL OCCULT BLD: POSITIVE — AB

## 2015-07-31 LAB — CBC
HCT: 31.8 % — ABNORMAL LOW (ref 36.0–46.0)
Hemoglobin: 10.5 g/dL — ABNORMAL LOW (ref 12.0–15.0)
MCH: 28.2 pg (ref 26.0–34.0)
MCHC: 33 g/dL (ref 30.0–36.0)
MCV: 85.3 fL (ref 78.0–100.0)
Platelets: 181 10*3/uL (ref 150–400)
RBC: 3.73 MIL/uL — ABNORMAL LOW (ref 3.87–5.11)
RDW: 14.5 % (ref 11.5–15.5)
WBC: 7.7 10*3/uL (ref 4.0–10.5)

## 2015-07-31 LAB — PROTIME-INR
INR: 1.41 (ref 0.00–1.49)
PROTHROMBIN TIME: 16.8 s — AB (ref 11.6–15.2)

## 2015-07-31 MED ORDER — MIDAZOLAM HCL 2 MG/2ML IJ SOLN
INTRAMUSCULAR | Status: AC
  Filled 2015-07-31: qty 2

## 2015-07-31 MED ORDER — FENTANYL CITRATE (PF) 100 MCG/2ML IJ SOLN
INTRAMUSCULAR | Status: AC | PRN
  Administered 2015-07-31: 50 ug via INTRAVENOUS

## 2015-07-31 MED ORDER — POTASSIUM CHLORIDE 10 MEQ/100ML IV SOLN
10.0000 meq | INTRAVENOUS | Status: AC
  Administered 2015-07-31 (×5): 10 meq via INTRAVENOUS
  Filled 2015-07-31 (×6): qty 100

## 2015-07-31 MED ORDER — MIDAZOLAM HCL 2 MG/2ML IJ SOLN
INTRAMUSCULAR | Status: AC | PRN
  Administered 2015-07-31: 1 mg via INTRAVENOUS

## 2015-07-31 MED ORDER — FLUMAZENIL 0.5 MG/5ML IV SOLN
INTRAVENOUS | Status: AC
  Filled 2015-07-31: qty 5

## 2015-07-31 MED ORDER — NALOXONE HCL 0.4 MG/ML IJ SOLN
INTRAMUSCULAR | Status: AC
  Filled 2015-07-31: qty 1

## 2015-07-31 MED ORDER — FENTANYL CITRATE (PF) 100 MCG/2ML IJ SOLN
INTRAMUSCULAR | Status: AC
  Filled 2015-07-31: qty 2

## 2015-07-31 NOTE — Telephone Encounter (Signed)
Patient called waiting to hear from Dr. Marko Plume or her nurse regarding her perforated bowel and pending surgery.  Writer spoke with Barbaraann Share, RN and she will contact the patient.

## 2015-07-31 NOTE — Telephone Encounter (Signed)
Spoke with [name] and told her that Dr. Marko Plume was notified of her admission by the Hospitalist. Dr. Marko Plume is seeing patients but will be over to see her today. [name] appreciated the call.

## 2015-07-31 NOTE — Progress Notes (Signed)
Patient would like MD to call her sister, Gerrit Heck, and update her on her condition. Her sister lives in [place].

## 2015-07-31 NOTE — Sedation Documentation (Signed)
Patient denies pain or discomfort at this time.   

## 2015-07-31 NOTE — Progress Notes (Signed)
OT Cancellation Note  Patient Details Name: Carmyn Spoonamore MRN: [id number] DOB: 1956-05-16   Cancelled Treatment:    Reason Eval/Treat Not Completed: Medical issues which prohibited therapy - Pt awaiting percutaneous drainage.  Will reattempt.   Darlina Rumpf [place], OTR/L [id number]  07/31/2015, 9:53 AM

## 2015-07-31 NOTE — Consult Note (Signed)
Chief Complaint: Patient was seen in consultation today for CT-guided pelvic abscess drainage Chief Complaint  Patient presents with  . GI Bleeding  . Cancer    Referring Physician(s): [facility]CS  Shick, Trevor  History of Present Illness: Erin Santiago is a 60 y.o. female with past medical history of metastatic endometrial stromal sarcoma and prior hysterectomy /oophorectomy with chemoradiation, laparoscopic gastric sleeve resection, and diverticulosis .  She recently presented to the hospital with abdominal pain along with fever ,chills nausea , vomiting and diarrhea.  CT scan of the abdomen and pelvis on 2/23 revealed findings consistent with bowel perforation with a large complex right lower quadrant and pelvic abscess containing a small amount of contrast suggest communication with distal ileum. Findings could be due to tumor erosion from previously noted mass in region. Patient was evaluated by CCS; request now received for CT guided drainage of the right pelvic abscess. Patient currently denies fever, chest pain, vomiting or abnormal bleeding. She does have persistent diffuse abdominal pain as well as intermittent nausea and a few loose stools.   Past Medical History  Diagnosis Date  . Abnormal uterine bleeding   . Anemia   . Fibroid   . Heart murmur     under 5 yrs of age  . Cancer ([place]) dx'd 07/2014    endometrial stromal sarcoma  . Radiation 12/01/14-01/05/15    pelvis 45 gray  . Radiation 05/18/2015-06/05/15    T4 thoracic spine area 35 gray    Past Surgical History  Procedure Laterality Date  . Laparoscopic gastric sleeve resection  2011  . Abdominal hysterectomy  07/2014    at [facility]    Allergies: Review of patient's allergies indicates no known allergies.  Medications: Prior to Admission medications   Medication Sig Start Date End Date Taking? Authorizing Provider  calcium carbonate (TUMS - DOSED IN MG ELEMENTAL CALCIUM) 500 MG chewable tablet Chew 2 tablets by  mouth daily.    Yes Historical Provider, MD  dexamethasone (DECADRON) 4 MG tablet Take 2 tablets by mouth once a day on the day after chemotherapy and then take 2 tablets two times a day for 2 days. Take with food. 07/27/15  Yes Lennis Marion Downer, MD  docusate sodium (COLACE) 100 MG capsule Take 100 mg by mouth 2 (two) times daily as needed for mild constipation. Reported on 05/28/2015 07/17/14  Yes Historical Provider, MD  hydrocortisone 2.5 % cream Apply 1 application topically as needed (itch/psoriasis.). Reported on 07/17/2015 05/19/14  Yes Historical Provider, MD  lidocaine-prilocaine (EMLA) cream Apply to Porta-cath 1-2 hrs prior to access. Cover with [employer]. 08/18/14  Yes Lennis Marion Downer, MD  LORazepam (ATIVAN) 1 MG tablet Place 1/2 to 1 tablet under the tongue or swallow every 6 hours as needed for nausea 07/22/15  Yes Lennis P Livesay, MD  meloxicam (MOBIC) 15 MG tablet Take 15 mg by mouth daily.  06/09/14  Yes Historical Provider, MD  ondansetron (ZOFRAN) 8 MG tablet Take 1 tablet (8 mg total) by mouth every 8 (eight) hours as needed for nausea or vomiting. 07/22/15  Yes Lennis Marion Downer, MD  prochlorperazine (COMPAZINE) 10 MG tablet Take 1 tablet (10 mg total) by mouth every 6 (six) hours as needed (Nausea or vomiting). 07/27/15  Yes Lennis Marion Downer, MD  Psyllium 28.3 % POWD Take 1 packet by mouth daily as needed (for fiber). Reported on 05/26/2015   Yes Historical Provider, MD  Resveratrol 250 MG CAPS Take 250 mg by mouth  2 (two) times daily. Reported on 05/26/2015   Yes Historical Provider, MD  ferrous fumarate (HEMOCYTE) 325 (106 FE) MG TABS tablet Take 1 tab daily on an empty stomach with OJ or Vitamin C 500 mg Patient not taking: Reported on 07/09/2015 09/15/14   Lennis Marion Downer, MD  sucralfate (CARAFATE) 1 GM/10ML suspension Take 10 mLs (1 g total) by mouth 4 (four) times daily -  with meals and at bedtime. Patient not taking: Reported on 07/30/2015 05/20/15   [name], MD     Family  History  Problem Relation Age of Onset  . Hypertension Mother   . Diabetes Mother   . Thyroid disease Father   . Heart attack Father   . Other Father     enlarged heart  . Non-Hodgkin's lymphoma Mother 72    Social History   Social History  . Marital Status: Single    Spouse Name: N/A  . Number of Children: 0  . Years of Education: N/A   Occupational History  . secretarial    Social History Main Topics  . Smoking status: Never Smoker   . Smokeless tobacco: Never Used  . Alcohol Use: No  . Drug Use: No  . Sexual Activity: No   Other Topics Concern  . None   Social History Narrative      Review of Systems see above  Vital Signs: BP 112/66 mmHg  Pulse 103  Temp(Src) 99.2 F (37.3 C) (Oral)  Resp 25  Ht 5' (1.524 m)  Wt 221 lb 12.5 oz (100.6 kg)  BMI 43.31 kg/m2  SpO2 93%  LMP 02/04/2014  Physical Exama awake, alert. Chest with slightly diminished breath sounds at bases. Clean, intact right chest wall Port-A-Cath ;heart tachycardic but regular rhythm. Abdomen obese, soft, diffusely tender to palpation , few bowel sounds ; extremities with no significant edema   Mallampati Score:     Imaging: Ct Chest W Contrast  07/21/2015  CLINICAL DATA:  Restaging endometrial stromal sarcoma. EXAM: CT CHEST, ABDOMEN, AND PELVIS WITH CONTRAST TECHNIQUE: Multidetector CT imaging of the chest, abdomen and pelvis was performed following the standard protocol during bolus administration of intravenous contrast. CONTRAST:  125mL OMNIPAQUE IOHEXOL 300 MG/ML  SOLN COMPARISON:  05/07/2015 FINDINGS: CT CHEST Mediastinum: Low-attenuation nodule and right lobe of thyroid gland containing calcification is unchanged measuring 1.5 cm. No enlarged mediastinal or hilar adenopathy. No pericardial effusion. The trachea appears patent and is midline. Unremarkable appearance of the esophagus. No axillary or supraclavicular adenopathy. Lungs/Pleura: No pleural fluid. Stable 3 mm right upper lobe  lung nodule, image number 15 of series 4. 3 mm nodule within the left upper lobe is stable, image 21 of series 4. Musculoskeletal: Large lytic lesion involving the T4 vertebra is again noted and appears unchanged from previous exam, image number 45 of series 602. No new lytic lesion identified. CT ABDOMEN AND PELVIS Hepatobiliary: No focal liver abnormality. The gallbladder appears normal. Unremarkable appearance of the common bile duct. No intrahepatic bile duct dilatation. Pancreas: The pancreas is unremarkable. Spleen: The spleen is normal. Adrenals/Urinary Tract: The adrenal glands are normal. Normal appearance of the left kidney. The right kidney appears mile rotated. The urinary bladder appears normal. Stomach/Bowel: Postoperative appearance of the stomach. The small bowel loops have a normal course and caliber. No obstruction. Normal appearance of the colon. Vascular/Lymphatic: Normal appearance of the abdominal aorta. The aorta scratch set enlarged abdominal and pelvic adenopathy is again noted. Index aortocaval lymph node measures 3.3 cm, image  74 series 2. Previously 3.1 cm. The left common iliac lymph node measures 2.1 cm, image 81 of series 2. Adjacent left common iliac lymph node measures 1.6 x 3.4 cm, image 84 of series 2. Previously 1.4 x 1.9 cm. Previously this measured the same. Left pelvic sidewall lymph node measures 2.3 cm, image 99 of series 2. Previously this measured the same. Reproductive: Previous hysterectomy. Other: Right lower quadrant mass measures 5.0 x 5.1 x 5.7 cm. On the previous exam this measured 1.5 x 2.3 x 3.5 cm. Within the right iliac fossa there is a soft tissue nodule measuring 2.2 cm, image 43 of series 602. Previously this measured 1.4 cm. Musculoskeletal: No aggressive lytic or sclerotic bone lesions. IMPRESSION: 1. Overall there has been interval progression of disease. 2. There has been interval enlargement of left common iliac adenopathy. Additionally, there are 2  masses within the right lower quadrant and iliac fossa which demonstrate significant interval increase in size. 3. Stable appearance of retroperitoneal adenopathy and lytic metastasis within the thoracic spine. Electronically Signed   By: Kerby Moors M.D.   On: 07/21/2015 14:05   Ct Abdomen Pelvis W Contrast  07/30/2015  CLINICAL DATA:  Acute onset of abdominal pain today. Diarrhea. History of metastatic endometrial carcinoma. EXAM: CT ABDOMEN AND PELVIS WITH CONTRAST TECHNIQUE: Multidetector CT imaging of the abdomen and pelvis was performed using the standard protocol following bolus administration of intravenous contrast. CONTRAST:  123mL OMNIPAQUE IOHEXOL 300 MG/ML  SOLN COMPARISON:  07/21/2015. FINDINGS: Lower chest: Significant bibasilar atelectasis. No pleural effusion. No pericardial effusion. The distal esophagus is grossly normal. 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 Hepatobiliary: No focal hepatic lesions or intrahepatic biliary dilatation. The gallbladder is normal. No common bile duct dilatation. Pancreas: No mass, inflammation or ductal dilatation. Spleen: Normal size.  No focal lesions. Adrenals/Urinary Tract: The adrenal glands and kidneys are unremarkable and stable. Stomach/Bowel: Surgical changes involving the stomach with gastric sleeve procedure. The duodenum is unremarkable. The proximal and mid small bowel is unremarkable. There is moderate inflammation in falling the terminal and distal ileum. On the prior CT scan there was a 5 cm "Mass" near the cecum. This now contains air and contrast suggesting that there has been erosion into the small bowel or a walled-off small bowel perforation. In retrospect I am not sure that at the pericecal lesion was actually nodal disease and may have been a complex fluid collection at that point that has now fistualized. Also, if this was a solid mass that had necrosis and eroded into the small bowel I would not expect to see air  throughout the entire area. Either way, this is a bowel perforation and abscess and need to be drained. There is also moderate fluid in the small amount of air in the pelvis which may communicate. Vascular/Lymphatic: The nodal disease in the abdomen/pelvis appears relatively stable since 2 weeks ago. The periduodenal lesion on image 30 measures 12 mm. Retroperitoneal lesion on image 45 measures 37.5 mm. The left-sided retroperitoneal nodal lesion on image number 52 measures 21.5 mm. Left pelvic nodal lesion on image number 72 measures 20.5 mm. Other: Status post hysterectomy.  No inguinal adenopathy. Musculoskeletal: No significant bony findings. IMPRESSION: CT findings consistent with a bowel perforation. There is a large (9 x 4.5 cm) complex right lower quadrant and pelvic abscess containing a small amount of contrast suggesting communication with the distal ileum. This could be due to tumor erosion as discussed above. Stable adenopathy in the  abdomen/pelvis. These results were called by telephone at the time of interpretation on 07/30/2015 at 3:41 pm to Dr. Margarita Mail , who verbally acknowledged these results. Electronically Signed   By: Marijo Sanes M.D.   On: 07/30/2015 15:41   Ct Abdomen Pelvis W Contrast  07/21/2015  CLINICAL DATA:  Restaging endometrial stromal sarcoma. EXAM: CT CHEST, ABDOMEN, AND PELVIS WITH CONTRAST TECHNIQUE: Multidetector CT imaging of the chest, abdomen and pelvis was performed following the standard protocol during bolus administration of intravenous contrast. CONTRAST:  189mL OMNIPAQUE IOHEXOL 300 MG/ML  SOLN COMPARISON:  05/07/2015 FINDINGS: CT CHEST Mediastinum: Low-attenuation nodule and right lobe of thyroid gland containing calcification is unchanged measuring 1.5 cm. No enlarged mediastinal or hilar adenopathy. No pericardial effusion. The trachea appears patent and is midline. Unremarkable appearance of the esophagus. No axillary or supraclavicular adenopathy.  Lungs/Pleura: No pleural fluid. Stable 3 mm right upper lobe lung nodule, image number 15 of series 4. 3 mm nodule within the left upper lobe is stable, image 21 of series 4. Musculoskeletal: Large lytic lesion involving the T4 vertebra is again noted and appears unchanged from previous exam, image number 45 of series 602. No new lytic lesion identified. CT ABDOMEN AND PELVIS Hepatobiliary: No focal liver abnormality. The gallbladder appears normal. Unremarkable appearance of the common bile duct. No intrahepatic bile duct dilatation. Pancreas: The pancreas is unremarkable. Spleen: The spleen is normal. Adrenals/Urinary Tract: The adrenal glands are normal. Normal appearance of the left kidney. The right kidney appears mile rotated. The urinary bladder appears normal. Stomach/Bowel: Postoperative appearance of the stomach. The small bowel loops have a normal course and caliber. No obstruction. Normal appearance of the colon. Vascular/Lymphatic: Normal appearance of the abdominal aorta. The aorta scratch set enlarged abdominal and pelvic adenopathy is again noted. Index aortocaval lymph node measures 3.3 cm, image 74 series 2. Previously 3.1 cm. The left common iliac lymph node measures 2.1 cm, image 81 of series 2. Adjacent left common iliac lymph node measures 1.6 x 3.4 cm, image 84 of series 2. Previously 1.4 x 1.9 cm. Previously this measured the same. Left pelvic sidewall lymph node measures 2.3 cm, image 99 of series 2. Previously this measured the same. Reproductive: Previous hysterectomy. Other: Right lower quadrant mass measures 5.0 x 5.1 x 5.7 cm. On the previous exam this measured 1.5 x 2.3 x 3.5 cm. Within the right iliac fossa there is a soft tissue nodule measuring 2.2 cm, image 43 of series 602. Previously this measured 1.4 cm. Musculoskeletal: No aggressive lytic or sclerotic bone lesions. IMPRESSION: 1. Overall there has been interval progression of disease. 2. There has been interval enlargement of  left common iliac adenopathy. Additionally, there are 2 masses within the right lower quadrant and iliac fossa which demonstrate significant interval increase in size. 3. Stable appearance of retroperitoneal adenopathy and lytic metastasis within the thoracic spine. Electronically Signed   By: Kerby Moors M.D.   On: 07/21/2015 14:05   Dg Abd Acute W/chest  07/30/2015  CLINICAL DATA:  Shortness of breath today, history endometrial cancer, diverticulosis, abdominal pain, cold sensation, blood in diarrhea, urinary incontinence, post chemotherapy and radiation therapy EXAM: DG ABDOMEN ACUTE W/ 1V CHEST COMPARISON:  Chest radiograph 11/10/2014, CT chest abdomen and pelvis 07/21/2015 FINDINGS: RIGHT jugular Port-A-Cath with tip projecting over SVC above cavoatrial junction. Minimal enlargement of cardiac silhouette. Mild tortuosity of thoracic aorta. Mediastinal contours and pulmonary vascularity otherwise normal. Bibasilar atelectasis. Lungs otherwise clear. No pleural effusion or pneumothorax. Retained  contrast within a few small bowel loops in the mid abdomen which appear prominent, raising question of small bowel obstruction. These small bowel loops appear more prominent than were seen on the recent CT. No definite free intraperitoneal air. Prior gastric surgery. Bones demineralized. IMPRESSION: Dilated small bowel loops containing residual contrast in the mid abdomen cold most likely representing a component of small bowel obstruction. Bibasilar atelectasis. Electronically Signed   By: Lavonia Dana M.D.   On: 07/30/2015 13:16    Labs:  CBC:  Recent Labs  06/25/15 1548 07/17/15 1058 07/30/15 1251 07/31/15 0428  WBC 5.4 5.2 11.1* 7.7  HGB 12.6 12.3 13.8 10.5*  HCT 38.0 37.9 42.5 31.8*  PLT 225 245 191 181    COAGS:  Recent Labs  08/26/14 1300 07/30/15 2242 07/31/15 0428  INR 0.95 1.49 1.41  APTT 27  --  32    BMP:  Recent Labs  09/12/14 0528  09/18/14 1039  06/25/15 1548  07/17/15 1058 07/30/15 1251 07/31/15 0428  NA 139  < > 139  < > 141 140 133* 136  K 3.8  < > 4.7  < > 4.0 4.1 3.5 3.3*  CL 104  --  108  --   --   --  102 106  CO2 24  < > 26  < > 27 23 19* 22  GLUCOSE 174*  < > 99  < > 81 93 125* 96  BUN 8  < > 21  < > 18.4 18.4 29* 16  CALCIUM 8.0*  < > 8.8  < > 9.4 9.2 8.5* 7.6*  CREATININE 0.81  < > 0.76  < > 0.8 0.8 1.06* 0.72  GFRNONAA 78*  --  >90  --   --   --  56* >60  GFRAA >90  --  >90  --   --   --  >60 >60  < > = values in this interval not displayed.  LIVER FUNCTION TESTS:  Recent Labs  06/25/15 1548 07/17/15 1058 07/30/15 1251 07/31/15 0428  BILITOT 0.65 0.85 3.4* 2.4*  AST 13 18 23 16   ALT 11 12 13* 11*  ALKPHOS 78 87 60 48  PROT 7.2 7.1 7.5 5.6*  ALBUMIN 3.5 3.5 3.9 2.7*    TUMOR MARKERS: No results for input(s): AFPTM, CEA, CA199, CHROMGRNA in the last 8760 hours.  Assessment and Plan:  Patient with history of metastatic endometrial stromal sarcoma and prior chemoradiation along with hysterectomy and oophorectomy ; presenting now with progressive/persistent abdominal pain and findings of bowel perforation with large complex right lower quadrant/pelvic abscess, possibly representing tumor erosion . Request now received from CCS for CT guided  drainage of the right lower quadrant abscess. Imaging studies have been reviewed by Dr. Annamaria Boots. Details/risks of procedure, including but not limited to, inability to safely drain abscess, infection/sepsis, internal bleeding, injury to adjacent organs, possible surgery, discussed with patient with her understanding and consent. Procedure scheduled for later today.    Thank you for this interesting consult.  I greatly enjoyed meeting Daelyn Becher and look forward to participating in their care.  A copy of this report was sent to the requesting provider on this date.  Electronically Signed: D. Rowe Robert 07/31/2015, 11:05 AM   I spent a total of 20 minutes in face to face in clinical  consultation, greater than 50% of which was counseling/coordinating care for CT-guided pelvic abscess drainage

## 2015-07-31 NOTE — Care Management Note (Signed)
Case Management Note  Patient Details  Name: Erin Santiago MRN: [id number] Date of Birth: 01/03/1956  Subjective/Objective:         Gi bleed and hypotension           Action/Plan:Date: July 31, 2015 Chart reviewed for concurrent status and case management needs. Will continue to follow patient for changes and needs: Velva Harman, BSN, RN, [place]   [phone]   Expected Discharge Date:   (UNKNOWN)               Expected Discharge Plan:  Home/Self Care  In-House Referral:  NA  Discharge planning Services  CM Consult  Post Acute Care Choice:  NA Choice offered to:  NA  DME Arranged:    DME Agency:     HH Arranged:    HH Agency:     Status of Service:  In process, will continue to follow  Medicare Important Message Given:    Date Medicare IM Given:    Medicare IM give by:    Date Additional Medicare IM Given:    Additional Medicare Important Message give by:     If discussed at [place] of Stay Meetings, dates discussed:    Additional Comments:  Leeroy Cha, RN 07/31/2015, 8:02 AM

## 2015-07-31 NOTE — Progress Notes (Signed)
Patient ID: Erin Santiago, female   DOB: 06-Jul-1955, 60 y.o.   MRN: [id number]     [facility]      [address] [place], [place] [address]    Phone: [phone] FAX: [phone]     Subjective: Overall, the patient reports her pain has improved. WBC has normalized.  Afebrile.  Heart rate is borderline.  Blood pressure is stable. She is having diarrhea, no hematochezia. Reports intermittent nausea.   Objective:  Vital signs:  Filed Vitals:   07/31/15 0500 07/31/15 0600 07/31/15 0700 07/31/15 0800  BP: 107/51 106/56 112/66   Pulse: 99 98 103   Temp:    99.2 F (37.3 C)  TempSrc:    Oral  Resp: '21 20 25   '$ Height:      Weight:      SpO2: 96% 97% 93%     Last BM Date: 07/30/15  Intake/Output   Yesterday:  02/23 0701 - 02/24 0700 In: 2537.1 [I.V.:1337.1; IV Piggyback:200] Out: 125 [Urine:125] This shift: .I/O last 3 completed shifts: In: 2537.1 [I.V.:1337.1; Other:1000; IV Piggyback:200] Out: 125 [Urine:125]     Physical Exam: General: Pt awake/alert/oriented x4 in no  acute distress Abdomen: Soft.  Nondistended.  Diffuse tenderness without guarding.  No evidence of peritonitis.  No incarcerated hernias.    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)   Metastatic cancer to spine [facility])    Results:   Labs: Results for orders placed or performed during the hospital encounter of 07/30/15 (from the past 48 hour(s))  Lipase, blood     Status: None   Collection Time: 07/30/15 12:51 PM  Result Value Ref Range   Lipase 18 11 - 51 U/L  Comprehensive metabolic panel     Status: Abnormal   Collection Time: 07/30/15 12:51 PM  Result Value Ref  Range   Sodium 133 (L) 135 - 145 mmol/L   Potassium 3.5 3.5 - 5.1 mmol/L   Chloride 102 101 - 111 mmol/L   CO2 19 (L) 22 - 32 mmol/L   Glucose, Bld 125 (H) 65 - 99 mg/dL   BUN 29 (H) 6 - 20 mg/dL   Creatinine, Ser 1.06 (H) 0.44 - 1.00 mg/dL   Calcium 8.5 (L) 8.9 - 10.3 mg/dL   Total Protein 7.5 6.5 - 8.1 g/dL   Albumin 3.9 3.5 - 5.0 g/dL   AST 23 15 - 41 U/L   ALT 13 (L) 14 - 54 U/L   Alkaline Phosphatase 60 38 - 126 U/L   Total Bilirubin 3.4 (H) 0.3 - 1.2 mg/dL   GFR calc non Af Amer 56 (L) >60 mL/min   GFR calc Af Amer >60 >60 mL/min    Comment: (NOTE) The eGFR has been calculated using the CKD EPI equation. This calculation has not been validated in all clinical situations. eGFR's persistently <60 mL/min signify possible Chronic Kidney Disease.    Anion gap 12 5 - 15  CBC     Status: Abnormal   Collection Time: 07/30/15 12:51 PM  Result Value Ref Range   WBC 11.1 (H) 4.0 - 10.5 K/uL   RBC 5.00 3.87 - 5.11 MIL/uL   Hemoglobin 13.8  12.0 - 15.0 g/dL   HCT 42.5 36.0 - 46.0 %   MCV 85.0 78.0 - 100.0 fL   MCH 27.6 26.0 - 34.0 pg   MCHC 32.5 30.0 - 36.0 g/dL   RDW 14.4 11.5 - 15.5 %   Platelets 191 150 - 400 K/uL  Type and screen [place]     Status: None   Collection Time: 07/30/15 12:51 PM  Result Value Ref Range   ABO/RH(D) O POS    Antibody Screen NEG    Sample Expiration 08/02/2015   I-Stat CG4 Lactic Acid, ED     Status: Abnormal   Collection Time: 07/30/15  1:05 PM  Result Value Ref Range   Lactic Acid, Venous 2.67 (HH) 0.5 - 2.0 mmol/L   Comment NOTIFIED PHYSICIAN   I-Stat CG4 Lactic Acid, ED     Status: None   Collection Time: 07/30/15  2:31 PM  Result Value Ref Range   Lactic Acid, Venous 0.93 0.5 - 2.0 mmol/L  Magnesium     Status: None   Collection Time: 07/30/15  6:17 PM  Result Value Ref Range   Magnesium 1.7 1.7 - 2.4 mg/dL  Urinalysis, Routine w reflex microscopic (not at [facility])     Status: Abnormal   Collection Time: 07/30/15   7:36 PM  Result Value Ref Range   Color, Urine AMBER (A) YELLOW    Comment: BIOCHEMICALS MAY BE AFFECTED BY COLOR   APPearance CLEAR CLEAR   Specific Gravity, Urine >1.046 (H) 1.005 - 1.030   pH 5.5 5.0 - 8.0   Glucose, UA NEGATIVE NEGATIVE mg/dL   Hgb urine dipstick TRACE (A) NEGATIVE   Bilirubin Urine NEGATIVE NEGATIVE   Ketones, ur 15 (A) NEGATIVE mg/dL   Protein, ur 30 (A) NEGATIVE mg/dL   Nitrite NEGATIVE NEGATIVE   Leukocytes, UA NEGATIVE NEGATIVE  Urine microscopic-add on     Status: Abnormal   Collection Time: 07/30/15  7:36 PM  Result Value Ref Range   Squamous Epithelial / LPF 0-5 (A) NONE SEEN   WBC, UA 6-30 0 - 5 WBC/hpf   RBC / HPF 6-30 0 - 5 RBC/hpf   Bacteria, UA FEW (A) NONE SEEN  MRSA PCR Screening     Status: None   Collection Time: 07/30/15  8:11 PM  Result Value Ref Range   MRSA by PCR NEGATIVE NEGATIVE    Comment:        The GeneXpert MRSA Assay (FDA approved for NASAL specimens only), is one component of a comprehensive MRSA colonization surveillance program. It is not intended to diagnose MRSA infection nor to guide or monitor treatment for MRSA infections.   Protime-INR     Status: Abnormal   Collection Time: 07/30/15 10:42 PM  Result Value Ref Range   Prothrombin Time 17.5 (H) 11.6 - 15.2 seconds   INR 1.49 0.00 - 1.49  Comprehensive metabolic panel     Status: Abnormal   Collection Time: 07/31/15  4:28 AM  Result Value Ref Range   Sodium 136 135 - 145 mmol/L   Potassium 3.3 (L) 3.5 - 5.1 mmol/L   Chloride 106 101 - 111 mmol/L   CO2 22 22 - 32 mmol/L   Glucose, Bld 96 65 - 99 mg/dL   BUN 16 6 - 20 mg/dL   Creatinine, Ser 0.72 0.44 - 1.00 mg/dL   Calcium 7.6 (L) 8.9 - 10.3 mg/dL   Total Protein 5.6 (L) 6.5 - 8.1 g/dL   Albumin 2.7 (L)  3.5 - 5.0 g/dL   AST 16 15 - 41 U/L   ALT 11 (L) 14 - 54 U/L   Alkaline Phosphatase 48 38 - 126 U/L   Total Bilirubin 2.4 (H) 0.3 - 1.2 mg/dL   GFR calc non Af Amer >60 >60 mL/min   GFR calc Af Amer >60  >60 mL/min    Comment: (NOTE) The eGFR has been calculated using the CKD EPI equation. This calculation has not been validated in all clinical situations. eGFR's persistently <60 mL/min signify possible Chronic Kidney Disease.    Anion gap 8 5 - 15  CBC     Status: Abnormal   Collection Time: 07/31/15  4:28 AM  Result Value Ref Range   WBC 7.7 4.0 - 10.5 K/uL   RBC 3.73 (L) 3.87 - 5.11 MIL/uL   Hemoglobin 10.5 (L) 12.0 - 15.0 g/dL    Comment: REPEATED TO VERIFY DELTA CHECK NOTED    HCT 31.8 (L) 36.0 - 46.0 %   MCV 85.3 78.0 - 100.0 fL   MCH 28.2 26.0 - 34.0 pg   MCHC 33.0 30.0 - 36.0 g/dL   RDW 14.5 11.5 - 15.5 %   Platelets 181 150 - 400 K/uL  Protime-INR     Status: Abnormal   Collection Time: 07/31/15  4:28 AM  Result Value Ref Range   Prothrombin Time 16.8 (H) 11.6 - 15.2 seconds   INR 1.41 0.00 - 1.49  APTT     Status: None   Collection Time: 07/31/15  4:28 AM  Result Value Ref Range   aPTT 32 24 - 37 seconds  Magnesium     Status: Abnormal   Collection Time: 07/31/15  4:28 AM  Result Value Ref Range   Magnesium 2.5 (H) 1.7 - 2.4 mg/dL  Lactic acid, plasma     Status: None   Collection Time: 07/31/15  4:28 AM  Result Value Ref Range   Lactic Acid, Venous 0.7 0.5 - 2.0 mmol/L    Imaging / Studies: Ct Abdomen Pelvis W Contrast  07/30/2015  CLINICAL DATA:  Acute onset of abdominal pain today. Diarrhea. History of metastatic endometrial carcinoma. EXAM: CT ABDOMEN AND PELVIS WITH CONTRAST TECHNIQUE: Multidetector CT imaging of the abdomen and pelvis was performed using the standard protocol following bolus administration of intravenous contrast. CONTRAST:  133m OMNIPAQUE IOHEXOL 300 MG/ML  SOLN COMPARISON:  07/21/2015. FINDINGS: Lower chest: Significant bibasilar atelectasis. No pleural effusion. No pericardial effusion. The distal esophagus is grossly normal. 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 Hepatobiliary: No focal hepatic lesions or intrahepatic  biliary dilatation. The gallbladder is normal. No common bile duct dilatation. Pancreas: No mass, inflammation or ductal dilatation. Spleen: Normal size.  No focal lesions. Adrenals/Urinary Tract: The adrenal glands and kidneys are unremarkable and stable. Stomach/Bowel: Surgical changes involving the stomach with gastric sleeve procedure. The duodenum is unremarkable. The proximal and mid small bowel is unremarkable. There is moderate inflammation in falling the terminal and distal ileum. On the prior CT scan there was a 5 cm "Mass" near the cecum. This now contains air and contrast suggesting that there has been erosion into the small bowel or a walled-off small bowel perforation. In retrospect I am not sure that at the pericecal lesion was actually nodal disease and may have been a complex fluid collection at that point that has now fistualized. Also, if this was a solid mass that had necrosis and eroded into the small bowel I would not expect to see  air throughout the entire area. Either way, this is a bowel perforation and abscess and need to be drained. There is also moderate fluid in the small amount of air in the pelvis which may communicate. Vascular/Lymphatic: The nodal disease in the abdomen/pelvis appears relatively stable since 2 weeks ago. The periduodenal lesion on image 30 measures 12 mm. Retroperitoneal lesion on image 45 measures 37.5 mm. The left-sided retroperitoneal nodal lesion on image number 52 measures 21.5 mm. Left pelvic nodal lesion on image number 72 measures 20.5 mm. Other: Status post hysterectomy.  No inguinal adenopathy. Musculoskeletal: No significant bony findings. IMPRESSION: CT findings consistent with a bowel perforation. There is a large (9 x 4.5 cm) complex right lower quadrant and pelvic abscess containing a small amount of contrast suggesting communication with the distal ileum. This could be due to tumor erosion as discussed above. Stable adenopathy in the abdomen/pelvis.  These results were called by telephone at the time of interpretation on 07/30/2015 at 3:41 pm to Dr. Margarita Mail , who verbally acknowledged these results. Electronically Signed   By: Marijo Sanes M.D.   On: 07/30/2015 15:41   Dg Abd Acute W/chest  07/30/2015  CLINICAL DATA:  Shortness of breath today, history endometrial cancer, diverticulosis, abdominal pain, cold sensation, blood in diarrhea, urinary incontinence, post chemotherapy and radiation therapy EXAM: DG ABDOMEN ACUTE W/ 1V CHEST COMPARISON:  Chest radiograph 11/10/2014, CT chest abdomen and pelvis 07/21/2015 FINDINGS: RIGHT jugular Port-A-Cath with tip projecting over SVC above cavoatrial junction. Minimal enlargement of cardiac silhouette. Mild tortuosity of thoracic aorta. Mediastinal contours and pulmonary vascularity otherwise normal. Bibasilar atelectasis. Lungs otherwise clear. No pleural effusion or pneumothorax. Retained contrast within a few small bowel loops in the mid abdomen which appear prominent, raising question of small bowel obstruction. These small bowel loops appear more prominent than were seen on the recent CT. No definite free intraperitoneal air. Prior gastric surgery. Bones demineralized. IMPRESSION: Dilated small bowel loops containing residual contrast in the mid abdomen cold most likely representing a component of small bowel obstruction. Bibasilar atelectasis. Electronically Signed   By: Lavonia Dana M.D.   On: 07/30/2015 13:16    Medications / Allergies:  Scheduled Meds: . piperacillin-tazobactam (ZOSYN)  IV  3.375 g Intravenous 3 times per day  . potassium chloride  10 mEq Intravenous Q1 Hr x 5  . sodium chloride flush  3 mL Intravenous Q12H   Continuous Infusions: . sodium chloride 0.9 % 1,000 mL infusion Stopped (07/31/15 0605)   PRN Meds:.acetaminophen **OR** acetaminophen, albuterol, bisacodyl, hydrocortisone cream, iohexol, ketorolac, LORazepam, morphine injection, ondansetron **OR** ondansetron  (ZOFRAN) IV  Antibiotics: Anti-infectives    Start     Dose/Rate Route Frequency Ordered Stop   07/30/15 1645  piperacillin-tazobactam (ZOSYN) IVPB 3.375 g     3.375 g 12.5 mL/hr over 240 Minutes Intravenous 3 times per day 07/30/15 1634     07/30/15 1545  piperacillin-tazobactam (ZOSYN) IVPB 3.375 g     3.375 g 100 mL/hr over 30 Minutes Intravenous  Once 07/30/15 1541 07/30/15 1757        Assessment/Plan 60 year old female with a history of 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  Bowel perforation Complex 9x4cm fluid collection Leukocytosis has resolved.  Afebrile.  Hemodynamically stable.  Remains diffusely tender, improved when compared to last evening.   Will proceed with percutaneous drainage if amendable.  If unsuccessful, then  we will further discuss surgical options. FEN-NPO, IVF, pain control  ID-zosyn    Erby Pian, [facility] Pager [phone]([id number])   07/31/2015 8:27 AM

## 2015-07-31 NOTE — Progress Notes (Signed)
[facility] PROGRESS NOTE  Erin Santiago [id number] DOB: 1955-09-21 DOA: 07/30/2015 PCP: Merrilee Seashore, MD  Assessment/Plan: #1 bowel perforation, complex intra-abdominal fluid collection 9 x 4.5 cm right lower quadrant and pelvic Questionable etiology. Possible invasion of cancer. Per CT abdomen and pelvis looks like perforation may have been walled off and contained. Blood cultures pending. Patient currently afebrile. Leukocytosis trending down. Continue empiric IV Zosyn. Patient has been seen in consultation by general surgery will recommending nonoperative percutaneous drainage placement. Interventional radiology. IR evaluated for possible drain placement. If patient shows any signs of deterioration patient likely needs emergency laparotomy. General surgery following.  #2 leukocytosis Likely secondary to problem #1. Blood cultures pending. Urinalysis nitrite negative leukocytes negative. WBC trending down. Continue empiric IV Zosyn.  #3 history of iron deficiency anemia Hemoglobin dropped to 10.5 from 13.8 on admission. Likely dilutional component as patient on IV fluids. Follow.  #4 dehydration Decrease IV fluids to 125 mL per hour.  #5 hypokalemia Replete.  #6 lactic acidosis Likely secondary to problem #1 and dehydration. Lactic acid levels trending down. Patient has been pancultured. Continue empiric IV Zosyn.  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 Patient status post chemotherapy and radiation to the pelvis. Patient was to start chemotherapy on the day of admission due to progression of disease involving the left, iliac adenopathy, 2 masses in the right lower quadrant seen on CT scan on 07/21/2015. Will inform oncology, Dr. Marko Plume of patient's admission.  #8 prophylaxis SCDs for DVT prophylaxis.   Code Status: Full Family  Communication: Updated patient. No family at bedside. Disposition Plan: Remain in the step down unit.   Consultants:  Gen. surgery: Dr. Excell Seltzer 07/30/2015  Procedures:  CT abdomen and pelvis 07/30/2015  Acute abdominal series 07/30/2015  Antibiotics:  IV Zosyn 07/30/2015  HPI/Subjective: Patient denies any shortness of breath. Patient denies any chest pain. Patient stated had a loose stool. Patient denies any hematochezia. Patient states abdominal pain improving since admission.  Objective: Filed Vitals:   07/31/15 0700 07/31/15 0800  BP: 112/66   Pulse: 103   Temp:  99.2 F (37.3 C)  Resp: 25     Intake/Output Summary (Last 24 hours) at 07/31/15 0936 Last data filed at 07/31/15 [id number]  Gross per 24 hour  Intake 2537.08 ml  Output    125 ml  Net 2412.08 ml   Filed Weights   07/30/15 2000  Weight: 100.6 kg (221 lb 12.5 oz)    Exam:   General:  NAD  Cardiovascular: Tachycardia  Respiratory: CTAB. Shallow breaths.  Abdomen: Soft, diffuse tenderness to palpation right side greater than left, hypoactive bowel sounds, mild distention.  Musculoskeletal: No clubbing cyanosis or edema.   Data Reviewed: Basic Metabolic Panel:  Recent Labs Lab 07/30/15 1251 07/30/15 1817 07/31/15 0428  NA 133*  --  136  K 3.5  --  3.3*  CL 102  --  106  CO2 19*  --  22  GLUCOSE 125*  --  96  BUN 29*  --  16  CREATININE 1.06*  --  0.72  CALCIUM 8.5*  --  7.6*  MG  --  1.7 2.5*   Liver Function Tests:  Recent Labs Lab 07/30/15 1251 07/31/15 0428  AST 23 16  ALT 13* 11*  ALKPHOS 60 48  BILITOT 3.4* 2.4*  PROT 7.5 5.6*  ALBUMIN 3.9 2.7*    Recent Labs Lab 07/30/15  1251  LIPASE 18   No results for input(s): AMMONIA in the last 168 hours. CBC:  Recent Labs Lab 07/30/15 1251 07/31/15 0428  WBC 11.1* 7.7  HGB 13.8 10.5*  HCT 42.5 31.8*  MCV 85.0 85.3  PLT 191 181   Cardiac Enzymes: No results for input(s): CKTOTAL, CKMB, CKMBINDEX, TROPONINI in  the last 168 hours. BNP (last 3 results) No results for input(s): BNP in the last 8760 hours.  ProBNP (last 3 results) No results for input(s): PROBNP in the last 8760 hours.  CBG: No results for input(s): GLUCAP in the last 168 hours.  Recent Results (from the past 240 hour(s))  MRSA PCR Screening     Status: None   Collection Time: 07/30/15  8:11 PM  Result Value Ref Range Status   MRSA by PCR NEGATIVE NEGATIVE Final    Comment:        The GeneXpert MRSA Assay (FDA approved for NASAL specimens only), is one component of a comprehensive MRSA colonization surveillance program. It is not intended to diagnose MRSA infection nor to guide or monitor treatment for MRSA infections.      Studies: Ct Abdomen Pelvis W Contrast  07/30/2015  CLINICAL DATA:  Acute onset of abdominal pain today. Diarrhea. History of metastatic endometrial carcinoma. EXAM: CT ABDOMEN AND PELVIS WITH CONTRAST TECHNIQUE: Multidetector CT imaging of the abdomen and pelvis was performed using the standard protocol following bolus administration of intravenous contrast. CONTRAST:  180mL OMNIPAQUE IOHEXOL 300 MG/ML  SOLN COMPARISON:  07/21/2015. FINDINGS: Lower chest: Significant bibasilar atelectasis. No pleural effusion. No pericardial effusion. The distal esophagus is grossly normal. 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 Hepatobiliary: No focal hepatic lesions or intrahepatic biliary dilatation. The gallbladder is normal. No common bile duct dilatation. Pancreas: No mass, inflammation or ductal dilatation. Spleen: Normal size.  No focal lesions. Adrenals/Urinary Tract: The adrenal glands and kidneys are unremarkable and stable. Stomach/Bowel: Surgical changes involving the stomach with gastric sleeve procedure. The duodenum is unremarkable. The proximal and mid small bowel is unremarkable. There is moderate inflammation in falling the terminal and distal ileum. On the prior CT scan there was a 5 cm  "Mass" near the cecum. This now contains air and contrast suggesting that there has been erosion into the small bowel or a walled-off small bowel perforation. In retrospect I am not sure that at the pericecal lesion was actually nodal disease and may have been a complex fluid collection at that point that has now fistualized. Also, if this was a solid mass that had necrosis and eroded into the small bowel I would not expect to see air throughout the entire area. Either way, this is a bowel perforation and abscess and need to be drained. There is also moderate fluid in the small amount of air in the pelvis which may communicate. Vascular/Lymphatic: The nodal disease in the abdomen/pelvis appears relatively stable since 2 weeks ago. The periduodenal lesion on image 30 measures 12 mm. Retroperitoneal lesion on image 45 measures 37.5 mm. The left-sided retroperitoneal nodal lesion on image number 52 measures 21.5 mm. Left pelvic nodal lesion on image number 72 measures 20.5 mm. Other: Status post hysterectomy.  No inguinal adenopathy. Musculoskeletal: No significant bony findings. IMPRESSION: CT findings consistent with a bowel perforation. There is a large (9 x 4.5 cm) complex right lower quadrant and pelvic abscess containing a small amount of contrast suggesting communication with the distal ileum. This could be due to tumor erosion as discussed above.  Stable adenopathy in the abdomen/pelvis. These results were called by telephone at the time of interpretation on 07/30/2015 at 3:41 pm to Dr. Margarita Mail , who verbally acknowledged these results. Electronically Signed   By: Marijo Sanes M.D.   On: 07/30/2015 15:41   Dg Abd Acute W/chest  07/30/2015  CLINICAL DATA:  Shortness of breath today, history endometrial cancer, diverticulosis, abdominal pain, cold sensation, blood in diarrhea, urinary incontinence, post chemotherapy and radiation therapy EXAM: DG ABDOMEN ACUTE W/ 1V CHEST COMPARISON:  Chest radiograph  11/10/2014, CT chest abdomen and pelvis 07/21/2015 FINDINGS: RIGHT jugular Port-A-Cath with tip projecting over SVC above cavoatrial junction. Minimal enlargement of cardiac silhouette. Mild tortuosity of thoracic aorta. Mediastinal contours and pulmonary vascularity otherwise normal. Bibasilar atelectasis. Lungs otherwise clear. No pleural effusion or pneumothorax. Retained contrast within a few small bowel loops in the mid abdomen which appear prominent, raising question of small bowel obstruction. These small bowel loops appear more prominent than were seen on the recent CT. No definite free intraperitoneal air. Prior gastric surgery. Bones demineralized. IMPRESSION: Dilated small bowel loops containing residual contrast in the mid abdomen cold most likely representing a component of small bowel obstruction. Bibasilar atelectasis. Electronically Signed   By: Lavonia Dana M.D.   On: 07/30/2015 13:16    Scheduled Meds: . piperacillin-tazobactam (ZOSYN)  IV  3.375 g Intravenous 3 times per day  . potassium chloride  10 mEq Intravenous Q1 Hr x 5  . sodium chloride flush  3 mL Intravenous Q12H   Continuous Infusions: . 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 If 7PM-7AM, please contact night-coverage at [email or web address], password [facility] 07/31/2015, 9:36 AM  LOS: 1 day

## 2015-07-31 NOTE — Progress Notes (Signed)
MEDICAL ONCOLOGY July 31, 2015, 11:40 AM  Hospital day 2 Antibiotics: zosyn Chemotherapy: last gemzar taxotere 10-2014  Outpatient Physicians:Emma Rossi/ Andrew Au, , [name], [name] [facility]),Ajith Ashby Dawes (PCP [facility]), [name]; [name], [name]   Appreciate general surgery and Dr Irine Seal letting me know of admission on 07-30-15 for bowel perforation with RLQ/ pelvic abscess. 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 We had planned to begin adriamycin with olaratumab in salvage attempt this week, but did not start due to abdominal pain beginning 2-22.   Patient seen, no visitors here, ICU RN present.  Subjective: Pain in mid abdomen some better with present pain medication, worse with palpation of abdomen. Some nausea,  with vomiting on arrival to ED yesterday. Small bowel movement today not grossly melanotic but heme + per RN. "Thirsty". Has voided.   ONCOLOGIC HISTORY Patient had been menopausal since age 89 until she had what seemed to her to be a menstrual period in Sept 2015, with large blood clot at completion of bleeding stopped then. She continued with lesser bleeding over next several months until she was seen in 06-2014 by [name]. Hemoglobin was 12.7 on 07-04-14. CT CAP 06-25-14 had uterus 16.4 x 13.5 x 14.4 cm with marked expansion of endometrial canal by heterogeneous mass, bilateral pelvic sidewall adenopathy, iliac adenopathy with no definite retroperitoneal or mesenteric adenopathy, no ascites, no liver mets. Attempted endometrial sampling 06-26-14 and 07-03-14 was nondiagnostic; around that time the patient was passing clots and using one large maxipad hourly. She was seen by [name] 07-05-14, with uterine fundus palpable above umbilicus. She had surgery by [name] at [facility] on 07-14-14, which was exploratory laparotomy with TAH BSO,  bilateral total pelvic lymphadenectomy and sampling of aortic and renal nodes. Pathology [facility] [phone]) found high grade endometrial stromal sarcoma with primary 18 cm, 8/45 nodes involved including 2 right pelvic and 6 left pelvic nodes, ER PR negative. Case was presented at [facility] multidisciplinary conference 07-23-14, with recommendation for PET CT to evaluate inguinal and portahepatis nodes, and to consider adjuvant gemzar taxotere and possibly follow with 4 cycles of adriamycin, then to consider whole pelvic RT. She saw [name] for post op follow up on 07-28-14, with recommendation for gemzar taxotere and adriamycin, whole pelvic RT after chemo and consideration of Megace maintenance after chemo (possibly prior to negative ER PR information). PET 08-01-14 had some uptake in aortocaval and left paraaortic regions. She had day 1 cycle 1 gemzar taxotere on 08-28-14, day 8 cycle 1 on 09-04-14 and neulasta on 09-06-14. Admitted with neutropenic fever on day 14 cycle 1, with oral mucositis and some diarrhea. Counts maintained cycle 2 using OnPro neulasta day 9. She had progressive LE swelling after cycle 3, with venous dopplers negative 10-23-14, then LE swelling and SOB so marked after day 1 cycle 4 10-30-14 that chemo was held. Restaging CT AP + CXR 11-10-14 had stable tiny left pulmonary nodule/ no pleural effusion and normal heart size; CT had necrotic aortocaval adenopathy, iliac adenopathy L>R and 7x10 cm fluid collection in pelvis. She received IMRT 45 gray in 25 fractions to pelvis from 6-27 thru 01-05-15, with resolution of LE swelling by completion of course. CT CAP 02-03-15 showed improvement in retroperitoneal and pelvic involvement. She went onto observation after RT, plan to wait until clear progressive disease before resuming treatment, possibly adriamycin vs on study. CT CAP 05-07-15 showed new lytic  lesion at T4, stable 2-3 mm pulmonary nodules, increased left pelvic and retroperitoneal nodes and RLQ peritoneal  nodule. Radiation therapy summary as follows: Indication for treatment: A new bone lesion in the right side of the T4 vertebral body, with soft tissue extension slightly impinging upon the central spinal canal, some pain from this lesion Radiation treatment dates: 05/18/2015 through 06/05/2015 Site/dose: T4 thoracic spine area, 35 gray in 14 fractions  Social: originally from [place], lives alone in [place] with sheep, llamas, dogs. Just retired from [employer]. Sister in [place] is Therapist, sports. No tobacco.   Objective: Vital signs in last 24 hours: Blood pressure 123/44, pulse 99, temperature 99.2 F (37.3 C), temperature source Oral, resp. rate 23, height 5' (1.524 m), weight 221 lb 12.5 oz (100.6 kg), last menstrual period 02/04/2014, SpO2 95 %. Awake, alert, talkative, respirations not labored at 30 degrees on RA. Does not appear in acute discomfort. Oral mucosa moist and clear. Face a little flushed. PAC site ok, infusing maintenance IVF at 125 cc/hr (+ potassium run now). Lungs clear anteriorly laterally. Heart RRR no gallop, clear heart sounds. Abdomen soft, quiet, obese, not clearly more distended. LE trace edema, no cords. Moves all extremities, feet warm.  Intake/Output from previous day: 02/23 0701 - 02/24 0700 In: 2537.1 [I.V.:1337.1; IV Piggyback:200] Out: 125 [Urine:125] Intake/Output this shift: Total I/O In: 350 [I.V.:150; IV Piggyback:200] Out: -   Physical exam:  Lab Results:  Recent Labs  07/30/15 1251 07/31/15 0428  WBC 11.1* 7.7  HGB 13.8 10.5*  HCT 42.5 31.8*  PLT 191 181   BMET  Recent Labs  07/30/15 1251 07/31/15 0428  NA 133* 136  K 3.5 3.3*  CL 102 106  CO2 19* 22  GLUCOSE 125* 96  BUN 29* 16  CREATININE 1.06* 0.72  CALCIUM 8.5* 7.6*    Studies/Results: Ct Abdomen Pelvis W Contrast  07/30/2015  CLINICAL DATA:  Acute onset of abdominal pain today. Diarrhea. History of metastatic endometrial carcinoma. EXAM: CT ABDOMEN AND PELVIS WITH CONTRAST  TECHNIQUE: Multidetector CT imaging of the abdomen and pelvis was performed using the standard protocol following bolus administration of intravenous contrast. CONTRAST:  181mL OMNIPAQUE IOHEXOL 300 MG/ML  SOLN COMPARISON:  07/21/2015. FINDINGS: Lower chest: Significant bibasilar atelectasis. No pleural effusion. No pericardial effusion. The distal esophagus is grossly normal. 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 Hepatobiliary: No focal hepatic lesions or intrahepatic biliary dilatation. The gallbladder is normal. No common bile duct dilatation. Pancreas: No mass, inflammation or ductal dilatation. Spleen: Normal size.  No focal lesions. Adrenals/Urinary Tract: The adrenal glands and kidneys are unremarkable and stable. Stomach/Bowel: Surgical changes involving the stomach with gastric sleeve procedure. The duodenum is unremarkable. The proximal and mid small bowel is unremarkable. There is moderate inflammation in falling the terminal and distal ileum. On the prior CT scan there was a 5 cm "Mass" near the cecum. This now contains air and contrast suggesting that there has been erosion into the small bowel or a walled-off small bowel perforation. In retrospect I am not sure that at the pericecal lesion was actually nodal disease and may have been a complex fluid collection at that point that has now fistualized. Also, if this was a solid mass that had necrosis and eroded into the small bowel I would not expect to see air throughout the entire area. Either way, this is a bowel perforation and abscess and need to be drained. There is also moderate fluid in the small amount of air in  the pelvis which may communicate. Vascular/Lymphatic: The nodal disease in the abdomen/pelvis appears relatively stable since 2 weeks ago. The periduodenal lesion on image 30 measures 12 mm. Retroperitoneal lesion on image 45 measures 37.5 mm. The left-sided retroperitoneal nodal lesion on image number 52 measures 21.5  mm. Left pelvic nodal lesion on image number 72 measures 20.5 mm. Other: Status post hysterectomy.  No inguinal adenopathy. Musculoskeletal: No significant bony findings. IMPRESSION: CT findings consistent with a bowel perforation. There is a large (9 x 4.5 cm) complex right lower quadrant and pelvic abscess containing a small amount of contrast suggesting communication with the distal ileum. This could be due to tumor erosion as discussed above. Stable adenopathy in the abdomen/pelvis. These results were called by telephone at the time of interpretation on 07/30/2015 at 3:41 pm to Dr. Margarita Mail , who verbally acknowledged these results. Electronically Signed   By: Marijo Sanes M.D.   On: 07/30/2015 15:41   Dg Abd Acute W/chest  07/30/2015  CLINICAL DATA:  Shortness of breath today, history endometrial cancer, diverticulosis, abdominal pain, cold sensation, blood in diarrhea, urinary incontinence, post chemotherapy and radiation therapy EXAM: DG ABDOMEN ACUTE W/ 1V CHEST COMPARISON:  Chest radiograph 11/10/2014, CT chest abdomen and pelvis 07/21/2015 FINDINGS: RIGHT jugular Port-A-Cath with tip projecting over SVC above cavoatrial junction. Minimal enlargement of cardiac silhouette. Mild tortuosity of thoracic aorta. Mediastinal contours and pulmonary vascularity otherwise normal. Bibasilar atelectasis. Lungs otherwise clear. No pleural effusion or pneumothorax. Retained contrast within a few small bowel loops in the mid abdomen which appear prominent, raising question of small bowel obstruction. These small bowel loops appear more prominent than were seen on the recent CT. No definite free intraperitoneal air. Prior gastric surgery. Bones demineralized. IMPRESSION: Dilated small bowel loops containing residual contrast in the mid abdomen cold most likely representing a component of small bowel obstruction. Bibasilar atelectasis. Electronically Signed   By: Lavonia Dana M.D.   On: 07/30/2015 13:16      DISCUSSION:  All of interval events discussed. She is appreciative of care by surgery and hospitalist service   Assessment/Plan: 1.bowel perforation/ abscess: possible tumor invasion into bowel. General surgery following, for drain by IR later today. Holding start of chemo. 2.high grade endometrial stromal sarcoma: initial surgery [facility] 07-14-14, progression by start of cycle 4 adjuvant gemzar taxotere in 10-2014. Radiation to symptomatic aortocaval and bilateral iliac adenopathy. Further progression recently including T4, post radiation there. Not eligible for any of the arms of MATCH trial. Plan had been to start adriamycin + [facility]atumab at [facility] this week, held with bowel perforation. 3. 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 Does not have advance directives.  Please page me if I can help between my rounds [phone], or med onc on call LIVESAY,LENNIS P  MD

## 2015-07-31 NOTE — Procedures (Signed)
Successful RLQ 10FR ABDOMINAL ABSCESS DRAIN WITH CT No comp Stable Bloody exudative fld aspirated 50cc Full report in PACS

## 2015-08-01 LAB — CBC WITH DIFFERENTIAL/PLATELET
BASOS ABS: 0 10*3/uL (ref 0.0–0.1)
BASOS PCT: 0 %
Basophils Absolute: 0 10*3/uL (ref 0.0–0.1)
Basophils Relative: 0 %
EOS ABS: 0.3 10*3/uL (ref 0.0–0.7)
EOS PCT: 3 %
EOS PCT: 4 %
Eosinophils Absolute: 0.4 10*3/uL (ref 0.0–0.7)
HCT: 29.8 % — ABNORMAL LOW (ref 36.0–46.0)
HEMATOCRIT: 29.9 % — AB (ref 36.0–46.0)
Hemoglobin: 9.7 g/dL — ABNORMAL LOW (ref 12.0–15.0)
Hemoglobin: 9.8 g/dL — ABNORMAL LOW (ref 12.0–15.0)
LYMPHS ABS: 0.4 10*3/uL — AB (ref 0.7–4.0)
LYMPHS PCT: 4 %
Lymphocytes Relative: 3 %
Lymphs Abs: 0.3 10*3/uL — ABNORMAL LOW (ref 0.7–4.0)
MCH: 28 pg (ref 26.0–34.0)
MCH: 28 pg (ref 26.0–34.0)
MCHC: 32.4 g/dL (ref 30.0–36.0)
MCHC: 32.9 g/dL (ref 30.0–36.0)
MCV: 86.4 fL (ref 78.0–100.0)
MCV: 85.1 fL (ref 78.0–100.0)
MONO ABS: 0.5 10*3/uL (ref 0.1–1.0)
MONO ABS: 0.7 10*3/uL (ref 0.1–1.0)
MONOS PCT: 5 %
Monocytes Relative: 7 %
Neutro Abs: 8 10*3/uL — ABNORMAL HIGH (ref 1.7–7.7)
Neutro Abs: 8.9 10*3/uL — ABNORMAL HIGH (ref 1.7–7.7)
Neutrophils Relative %: 89 %
Neutrophils Relative %: 85 %
PLATELETS: 225 10*3/uL (ref 150–400)
Platelets: 196 10*3/uL (ref 150–400)
RBC: 3.46 MIL/uL — ABNORMAL LOW (ref 3.87–5.11)
RBC: 3.5 MIL/uL — ABNORMAL LOW (ref 3.87–5.11)
RDW: 14.6 % (ref 11.5–15.5)
RDW: 14.4 % (ref 11.5–15.5)
WBC: 9.1 10*3/uL (ref 4.0–10.5)
WBC: 10.4 10*3/uL (ref 4.0–10.5)

## 2015-08-01 LAB — COMPREHENSIVE METABOLIC PANEL
ALBUMIN: 2.4 g/dL — AB (ref 3.5–5.0)
ALT: 9 U/L — AB (ref 14–54)
AST: 13 U/L — AB (ref 15–41)
Alkaline Phosphatase: 50 U/L (ref 38–126)
Anion gap: 10 (ref 5–15)
BILIRUBIN TOTAL: 2.9 mg/dL — AB (ref 0.3–1.2)
BUN: 13 mg/dL (ref 6–20)
CO2: 18 mmol/L — ABNORMAL LOW (ref 22–32)
CREATININE: 0.68 mg/dL (ref 0.44–1.00)
Calcium: 7.9 mg/dL — ABNORMAL LOW (ref 8.9–10.3)
Chloride: 110 mmol/L (ref 101–111)
GFR calc Af Amer: >60 mL/min (ref >60)
GFR calc non Af Amer: >60 mL/min (ref >60)
GLUCOSE: 68 mg/dL (ref 65–99)
POTASSIUM: 3.8 mmol/L (ref 3.5–5.1)
Sodium: 138 mmol/L (ref 135–145)
TOTAL PROTEIN: 5.5 g/dL — AB (ref 6.5–8.1)

## 2015-08-01 MED ORDER — STERILE WATER FOR INJECTION IV SOLN
INTRAVENOUS | Status: DC
  Administered 2015-08-01 (×2): via INTRAVENOUS
  Filled 2015-08-01 (×3): qty 850

## 2015-08-01 NOTE — Progress Notes (Signed)
[facility] PROGRESS NOTE  Coralis Trusty [id number] DOB: [birth year] DOA: 07/30/2015 PCP: Merrilee Seashore, MD  Assessment/Plan: #1 bowel perforation, complex intra-abdominal fluid collection 9 x 4.5 cm right lower quadrant and pelvic Questionable etiology.  Clinical improvement.Possible invasion of cancer/ metastatic endometrial cancer into bowel. Per CT abdomen and pelvis looks like perforation may have been walled off and contained. Blood cultures pending. Patient currently afebrile. Leukocytosis trending down. Continue empiric IV Zosyn. Patient has been seen in consultation by general surgery will recommending nonoperative percutaneous drainage placement. S/p RLQ percutaneous drain 07/31/2015 per IR.  Wound/abscess cultures pending. Patient has been started on clear liquids per general surgery. If patient shows any signs of deterioration patient likely needs emergency laparotomy. General surgery following.  #2 leukocytosis Likely secondary to problem #1. Blood cultures pending. Urinalysis nitrite negative leukocytes negative. WBC trending down. Continue empiric IV Zosyn.  #3 history of iron deficiency anemia Hemoglobin dropped to 10.5 from 13.8 on admission. Likely dilutional component as patient on IV fluids.  Hemoglobin currently at 9.7. Follow.  #4 dehydration/meetabolic acidosis  Change IV fluids to sodium bicarbonate as patient with a acidosis.   #5 hypokalemia Repleted.  #6 lactic acidosis Likely secondary to problem #1 and dehydration. Lactic acid levels trending down. Patient has been pancultured. Continue empiric IV Zosyn.  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 Patient status post chemotherapy and radiation to the pelvis. Patient was to start chemotherapy on the day of admission due to progression of disease involving the left, iliac  adenopathy, 2 masses in the right lower quadrant seen on CT scan on 07/21/2015.  Oncology following.  #8 prophylaxis SCDs for DVT prophylaxis.   Code Status: Full Family Communication: Updated patient. No family at bedside. Disposition Plan: Remain in the step down unit.   Consultants:  Gen. surgery: Dr. Excell Seltzer 07/30/2015   oncology: Dr. Marko Plume 07/31/2015  Procedures:  CT abdomen and pelvis 07/30/2015  Acute abdominal series 07/30/2015  Antibiotics:  IV Zosyn 07/30/2015  HPI/Subjective: Patient  Sitting up in chair. Patient states abdominal pain improving. Positive flatus. No bowel movement today.  Objective: Filed Vitals:   08/01/15 0700 08/01/15 0800  BP: 120/63 114/43  Pulse: 89 84  Temp: 98.5 F (36.9 C)   Resp: 19 21    Intake/Output Summary (Last 24 hours) at 08/01/15 0859 Last data filed at 08/01/15 0800  Gross per 24 hour  Intake   3750 ml  Output    895 ml  Net   2855 ml   Filed Weights   07/30/15 2000 08/01/15 0500  Weight: 100.6 kg (221 lb 12.5 oz) 103.1 kg (227 lb 4.7 oz)    Exam:   General:  NAD  Cardiovascular: RRR  Respiratory: CTAB. Shallow breaths.  Abdomen: Soft, less diffuse tenderness to palpation right side greater than left, + bowel sounds, mild distention. JP drain with serosanginous fluid.  Musculoskeletal: No clubbing cyanosis or edema.   Data Reviewed: Basic Metabolic Panel:  Recent Labs Lab 07/30/15 1251 07/30/15 1817 07/31/15 0428 07/31/15 1630 08/01/15 0509  NA 133*  --  136  --  138  K 3.5  --  3.3* 4.1 3.8  CL 102  --  106  --  110  CO2 19*  --  22  --  18*  GLUCOSE 125*  --  96  --  68  BUN 29*  --  16  --  13  CREATININE 1.06*  --  0.72  --  0.68  CALCIUM 8.5*  --  7.6*  --  7.9*  MG  --  1.7 2.5*  --   --    Liver Function Tests:  Recent Labs Lab 07/30/15 1251 07/31/15 0428 08/01/15 0509  AST 23 16 13*  ALT 13* 11* 9*  ALKPHOS 60 48 50  BILITOT 3.4* 2.4* 2.9*  PROT 7.5 5.6* 5.5*   ALBUMIN 3.9 2.7* 2.4*    Recent Labs Lab 07/30/15 1251  LIPASE 18   No results for input(s): AMMONIA in the last 168 hours. CBC:  Recent Labs Lab 07/30/15 1251 07/31/15 0428 08/01/15 0509  WBC 11.1* 7.7 9.1  NEUTROABS  --   --  8.0*  HGB 13.8 10.5* 9.7*  HCT 42.5 31.8* 29.9*  MCV 85.0 85.3 86.4  PLT 191 181 196   Cardiac Enzymes: No results for input(s): CKTOTAL, CKMB, CKMBINDEX, TROPONINI in the last 168 hours. BNP (last 3 results) No results for input(s): BNP in the last 8760 hours.  ProBNP (last 3 results) No results for input(s): PROBNP in the last 8760 hours.  CBG: No results for input(s): GLUCAP in the last 168 hours.  Recent Results (from the past 240 hour(s))  Blood culture (routine x 2)     Status: None (Preliminary result)   Collection Time: 07/30/15  6:31 PM  Result Value Ref Range Status   Specimen Description BLOOD RIGHT PORT  Final   Special Requests BOTTLES DRAWN AEROBIC AND ANAEROBIC 5CC EACH  Final   Culture   Final    NO GROWTH < 24 HOURS Performed at [facility]    Report Status PENDING  Incomplete  Blood culture (routine x 2)     Status: None (Preliminary result)   Collection Time: 07/30/15  6:33 PM  Result Value Ref Range Status   Specimen Description BLOOD LEFT WRIST  Final   Special Requests BOTTLES DRAWN AEROBIC AND ANAEROBIC 5CC EACH  Final   Culture   Final    NO GROWTH < 24 HOURS Performed at [facility]    Report Status PENDING  Incomplete  Culture, Urine     Status: None   Collection Time: 07/30/15  7:36 PM  Result Value Ref Range Status   Specimen Description URINE, RANDOM  Final   Special Requests NONE  Final   Culture   Final    3,000 COLONIES/mL INSIGNIFICANT GROWTH Performed at [facility]    Report Status 07/31/2015 FINAL  Final  MRSA PCR Screening     Status: None   Collection Time: 07/30/15  8:11 PM  Result Value Ref Range Status   MRSA by PCR NEGATIVE NEGATIVE Final    Comment:         The GeneXpert MRSA Assay (FDA approved for NASAL specimens only), is one component of a comprehensive MRSA colonization surveillance program. It is not intended to diagnose MRSA infection nor to guide or monitor treatment for MRSA infections.      Studies: Ct Abdomen Pelvis W Contrast  07/30/2015  CLINICAL DATA:  Acute onset of abdominal pain today. Diarrhea. History of metastatic endometrial carcinoma. EXAM: CT ABDOMEN AND PELVIS WITH CONTRAST TECHNIQUE: Multidetector CT imaging of the abdomen and pelvis was performed using the standard protocol following bolus administration of intravenous contrast. CONTRAST:  182mL OMNIPAQUE IOHEXOL 300 MG/ML  SOLN COMPARISON:  07/21/2015. FINDINGS: Lower chest: Significant bibasilar atelectasis. No pleural effusion. No pericardial effusion. The distal esophagus is grossly  normal. 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 Hepatobiliary: No focal hepatic lesions or intrahepatic biliary dilatation. The gallbladder is normal. No common bile duct dilatation. Pancreas: No mass, inflammation or ductal dilatation. Spleen: Normal size.  No focal lesions. Adrenals/Urinary Tract: The adrenal glands and kidneys are unremarkable and stable. Stomach/Bowel: Surgical changes involving the stomach with gastric sleeve procedure. The duodenum is unremarkable. The proximal and mid small bowel is unremarkable. There is moderate inflammation in falling the terminal and distal ileum. On the prior CT scan there was a 5 cm "Mass" near the cecum. This now contains air and contrast suggesting that there has been erosion into the small bowel or a walled-off small bowel perforation. In retrospect I am not sure that at the pericecal lesion was actually nodal disease and may have been a complex fluid collection at that point that has now fistualized. Also, if this was a solid mass that had necrosis and eroded into the small bowel I would not expect to see air throughout the  entire area. Either way, this is a bowel perforation and abscess and need to be drained. There is also moderate fluid in the small amount of air in the pelvis which may communicate. Vascular/Lymphatic: The nodal disease in the abdomen/pelvis appears relatively stable since 2 weeks ago. The periduodenal lesion on image 30 measures 12 mm. Retroperitoneal lesion on image 45 measures 37.5 mm. The left-sided retroperitoneal nodal lesion on image number 52 measures 21.5 mm. Left pelvic nodal lesion on image number 72 measures 20.5 mm. Other: Status post hysterectomy.  No inguinal adenopathy. Musculoskeletal: No significant bony findings. IMPRESSION: CT findings consistent with a bowel perforation. There is a large (9 x 4.5 cm) complex right lower quadrant and pelvic abscess containing a small amount of contrast suggesting communication with the distal ileum. This could be due to tumor erosion as discussed above. Stable adenopathy in the abdomen/pelvis. These results were called by telephone at the time of interpretation on 07/30/2015 at 3:41 pm to Dr. Margarita Mail , who verbally acknowledged these results. Electronically Signed   By: Marijo Sanes M.D.   On: 07/30/2015 15:41   Dg Abd Acute W/chest  07/30/2015  CLINICAL DATA:  Shortness of breath today, history endometrial cancer, diverticulosis, abdominal pain, cold sensation, blood in diarrhea, urinary incontinence, post chemotherapy and radiation therapy EXAM: DG ABDOMEN ACUTE W/ 1V CHEST COMPARISON:  Chest radiograph 11/10/2014, CT chest abdomen and pelvis 07/21/2015 FINDINGS: RIGHT jugular Port-A-Cath with tip projecting over SVC above cavoatrial junction. Minimal enlargement of cardiac silhouette. Mild tortuosity of thoracic aorta. Mediastinal contours and pulmonary vascularity otherwise normal. Bibasilar atelectasis. Lungs otherwise clear. No pleural effusion or pneumothorax. Retained contrast within a few small bowel loops in the mid abdomen which appear  prominent, raising question of small bowel obstruction. These small bowel loops appear more prominent than were seen on the recent CT. No definite free intraperitoneal air. Prior gastric surgery. Bones demineralized. IMPRESSION: Dilated small bowel loops containing residual contrast in the mid abdomen cold most likely representing a component of small bowel obstruction. Bibasilar atelectasis. Electronically Signed   By: Lavonia Dana M.D.   On: 07/30/2015 13:16   Ct Image Guided Drainage By Percutaneous Catheter  07/31/2015  INDICATION: Right lower quadrant abdominal abscess, bowel perforation EXAM: CT GUIDED DRAINAGE OF RIGHT LOWER QUADRANT ABDOMINAL ABSCESS MEDICATIONS: The patient is currently admitted to the hospital and receiving intravenous antibiotics. The antibiotics were administered within an appropriate time frame prior to the initiation of the  procedure. ANESTHESIA/SEDATION: 1.0 mg IV Versed 50 mcg IV Fentanyl Moderate Sedation Time:  24 MINUTES The patient was continuously monitored during the procedure by the interventional radiology nurse under my direct supervision. COMPLICATIONS: None immediate. TECHNIQUE: Informed written consent was obtained from the patient after a thorough discussion of the procedural risks, benefits and alternatives. All questions were addressed. Maximal Sterile Barrier Technique was utilized including caps, mask, sterile gowns, sterile gloves, sterile drape, hand hygiene and skin antiseptic. A timeout was performed prior to the initiation of the procedure. PROCEDURE: The right lower quadrant was prepped with ChloraPrep in a sterile fashion, and a sterile drape was applied covering the operative field. A sterile gown and sterile gloves were used for the procedure. Local anesthesia was provided with 1% Lidocaine. Previous imaging reviewed. Patient positioned supine. Noncontrast localization CT performed. The right lower quadrant air-fluid collection adjacent to the terminal  ileum was localized. Under sterile conditions and local anesthesia, an 18 gauge 15 cm access needle was advanced from a right lower quadrant lateral approach into the air-fluid collection. Guidewire inserted. position confirmed with serial CT. Tract dilatation performed to insert a 10 [place] drain. Catheter position confirmed with CT. Syringe aspiration yielded bloody exudative malodorous fluid. Sample sent for Gram stain and culture. Catheter secured with a Prolene suture and connected to external suction bulb. No immediate complication. Patient tolerated the procedure well. Sterile dressing applied. FINDINGS: CT imaging confirms needle access of the right lower quadrant abscess for drain insertion IMPRESSION: Successful CT-guided right lower quadrant abscess drain insertion as above. Electronically Signed   By: Jerilynn Mages.  Shick M.D.   On: 07/31/2015 16:16    Scheduled Meds: . piperacillin-tazobactam (ZOSYN)  IV  3.375 g Intravenous 3 times per day  . sodium chloride flush  3 mL Intravenous Q12H   Continuous Infusions: .  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 If 7PM-7AM, please contact night-coverage at [email or web address], password [facility] 08/01/2015, 8:59 AM  LOS: 2 days

## 2015-08-01 NOTE — Progress Notes (Signed)
Patient ID: Erin Santiago, female   DOB: 03-17-1956, 60 y.o.   MRN: [id number]    Subjective: Feels better. Less pain. Up out of bed. Had successful percutaneous drain placed yesterday.  Objective: Vital signs in last 24 hours: Temp:  [97.5 F (36.4 C)-99.4 F (37.4 C)] 98.7 F (37.1 C) (02/25 0400) Pulse Rate:  [87-104] 89 (02/25 0700) Resp:  [18-35] 19 (02/25 0700) BP: (104-127)/(44-81) 120/63 mmHg (02/25 0700) SpO2:  [91 %-100 %] 94 % (02/25 0700) Weight:  [103.1 kg (227 lb 4.7 oz)] 103.1 kg (227 lb 4.7 oz) (02/25 0500) Last BM Date: 07/31/15  Intake/Output from previous day: 02/24 0701 - 02/25 0700 In: 3420 [I.V.:3070; IV Piggyback:350] Out: 435 [Urine:300; Drains:135] Intake/Output this shift:    General appearance: alert, cooperative and no distress GI: minimal tenderness much improved from admission. Nondistended. Drain in place with  bloody/purulent fluid.  Lab Results:   Recent Labs  07/31/15 0428 08/01/15 0509  WBC 7.7 9.1  HGB 10.5* 9.7*  HCT 31.8* 29.9*  PLT 181 196   BMET  Recent Labs  07/31/15 0428 07/31/15 1630 08/01/15 0509  NA 136  --  138  K 3.3* 4.1 3.8  CL 106  --  110  CO2 22  --  18*  GLUCOSE 96  --  68  BUN 16  --  13  CREATININE 0.72  --  0.68  CALCIUM 7.6*  --  7.9*     Studies/Results: Ct Abdomen Pelvis W Contrast  07/30/2015  CLINICAL DATA:  Acute onset of abdominal pain today. Diarrhea. History of metastatic endometrial carcinoma. EXAM: CT ABDOMEN AND PELVIS WITH CONTRAST TECHNIQUE: Multidetector CT imaging of the abdomen and pelvis was performed using the standard protocol following bolus administration of intravenous contrast. CONTRAST:  115mL OMNIPAQUE IOHEXOL 300 MG/ML  SOLN COMPARISON:  07/21/2015. FINDINGS: Lower chest: Significant bibasilar atelectasis. No pleural effusion. No pericardial effusion. The distal esophagus is grossly normal. 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 Hepatobiliary: No focal hepatic  lesions or intrahepatic biliary dilatation. The gallbladder is normal. No common bile duct dilatation. Pancreas: No mass, inflammation or ductal dilatation. Spleen: Normal size.  No focal lesions. Adrenals/Urinary Tract: The adrenal glands and kidneys are unremarkable and stable. Stomach/Bowel: Surgical changes involving the stomach with gastric sleeve procedure. The duodenum is unremarkable. The proximal and mid small bowel is unremarkable. There is moderate inflammation in falling the terminal and distal ileum. On the prior CT scan there was a 5 cm "Mass" near the cecum. This now contains air and contrast suggesting that there has been erosion into the small bowel or a walled-off small bowel perforation. In retrospect I am not sure that at the pericecal lesion was actually nodal disease and may have been a complex fluid collection at that point that has now fistualized. Also, if this was a solid mass that had necrosis and eroded into the small bowel I would not expect to see air throughout the entire area. Either way, this is a bowel perforation and abscess and need to be drained. There is also moderate fluid in the small amount of air in the pelvis which may communicate. Vascular/Lymphatic: The nodal disease in the abdomen/pelvis appears relatively stable since 2 weeks ago. The periduodenal lesion on image 30 measures 12 mm. Retroperitoneal lesion on image 45 measures 37.5 mm. The left-sided retroperitoneal nodal lesion on image number 52 measures 21.5 mm. Left pelvic nodal lesion on image number 72 measures 20.5 mm. Other: Status post hysterectomy.  No inguinal adenopathy. Musculoskeletal: No significant bony findings. IMPRESSION: CT findings consistent with a bowel perforation. There is a large (9 x 4.5 cm) complex right lower quadrant and pelvic abscess containing a small amount of contrast suggesting communication with the distal ileum. This could be due to tumor erosion as discussed above. Stable adenopathy  in the abdomen/pelvis. These results were called by telephone at the time of interpretation on 07/30/2015 at 3:41 pm to Dr. Margarita Mail , who verbally acknowledged these results. Electronically Signed   By: Marijo Sanes M.D.   On: 07/30/2015 15:41   Dg Abd Acute W/chest  07/30/2015  CLINICAL DATA:  Shortness of breath today, history endometrial cancer, diverticulosis, abdominal pain, cold sensation, blood in diarrhea, urinary incontinence, post chemotherapy and radiation therapy EXAM: DG ABDOMEN ACUTE W/ 1V CHEST COMPARISON:  Chest radiograph 11/10/2014, CT chest abdomen and pelvis 07/21/2015 FINDINGS: RIGHT jugular Port-A-Cath with tip projecting over SVC above cavoatrial junction. Minimal enlargement of cardiac silhouette. Mild tortuosity of thoracic aorta. Mediastinal contours and pulmonary vascularity otherwise normal. Bibasilar atelectasis. Lungs otherwise clear. No pleural effusion or pneumothorax. Retained contrast within a few small bowel loops in the mid abdomen which appear prominent, raising question of small bowel obstruction. These small bowel loops appear more prominent than were seen on the recent CT. No definite free intraperitoneal air. Prior gastric surgery. Bones demineralized. IMPRESSION: Dilated small bowel loops containing residual contrast in the mid abdomen cold most likely representing a component of small bowel obstruction. Bibasilar atelectasis. Electronically Signed   By: Lavonia Dana M.D.   On: 07/30/2015 13:16   Ct Image Guided Drainage By Percutaneous Catheter  07/31/2015  INDICATION: Right lower quadrant abdominal abscess, bowel perforation EXAM: CT GUIDED DRAINAGE OF RIGHT LOWER QUADRANT ABDOMINAL ABSCESS MEDICATIONS: The patient is currently admitted to the hospital and receiving intravenous antibiotics. The antibiotics were administered within an appropriate time frame prior to the initiation of the procedure. ANESTHESIA/SEDATION: 1.0 mg IV Versed 50 mcg IV Fentanyl  Moderate Sedation Time:  24 MINUTES The patient was continuously monitored during the procedure by the interventional radiology nurse under my direct supervision. COMPLICATIONS: None immediate. TECHNIQUE: Informed written consent was obtained from the patient after a thorough discussion of the procedural risks, benefits and alternatives. All questions were addressed. Maximal Sterile Barrier Technique was utilized including caps, mask, sterile gowns, sterile gloves, sterile drape, hand hygiene and skin antiseptic. A timeout was performed prior to the initiation of the procedure. PROCEDURE: The right lower quadrant was prepped with ChloraPrep in a sterile fashion, and a sterile drape was applied covering the operative field. A sterile gown and sterile gloves were used for the procedure. Local anesthesia was provided with 1% Lidocaine. Previous imaging reviewed. Patient positioned supine. Noncontrast localization CT performed. The right lower quadrant air-fluid collection adjacent to the terminal ileum was localized. Under sterile conditions and local anesthesia, an 18 gauge 15 cm access needle was advanced from a right lower quadrant lateral approach into the air-fluid collection. Guidewire inserted. position confirmed with serial CT. Tract dilatation performed to insert a 10 [place] drain. Catheter position confirmed with CT. Syringe aspiration yielded bloody exudative malodorous fluid. Sample sent for Gram stain and culture. Catheter secured with a Prolene suture and connected to external suction bulb. No immediate complication. Patient tolerated the procedure well. Sterile dressing applied. FINDINGS: CT imaging confirms needle access of the right lower quadrant abscess for drain insertion IMPRESSION: Successful CT-guided right lower quadrant abscess drain insertion as above. Electronically Signed  By: Eugenie Filler M.D.   On: 07/31/2015 16:16    Anti-infectives: Anti-infectives    Start     Dose/Rate Route  Frequency Ordered Stop   07/30/15 1645  piperacillin-tazobactam (ZOSYN) IVPB 3.375 g     3.375 g 12.5 mL/hr over 240 Minutes Intravenous 3 times per day 07/30/15 1634     07/30/15 1545  piperacillin-tazobactam (ZOSYN) IVPB 3.375 g     3.375 g 100 mL/hr over 30 Minutes Intravenous  Once 07/30/15 1541 07/30/15 1757      Assessment/Plan: Metastatic endometrial cancer with right lower quadrant abscess, presumed tumor erosion into bowel. History of radical hysterectomy, pelvic radiation and chemotherapy Appears much improved on antibiotics and status post percutaneous drainage. Continue current treatment. Clear liquid diet ordered.     LOS: 2 days    Marycarmen Hagey T 08/01/2015

## 2015-08-01 NOTE — Progress Notes (Addendum)
PT Cancellation NoteLate entry also for 2/24 cancellation at 9:38  Patient Details Name: Erin Santiago MRN: [id number] DOB: Nov 22, 1955   Cancelled Treatment:    Reason Eval/Treat Not Completed: Medical issues which prohibited therapy on both 2/24 and 2/25.   [name] 08/01/2015, 4:41 PM Tresa Endo PT [phone]

## 2015-08-01 NOTE — Progress Notes (Signed)
Patient ID: Erin Santiago, female   DOB: 13-Jan-1956, 60 y.o.   MRN: [id number]    Referring Physician(s): [facility]  Corrie Mckusick  Chief Complaint:  Right lower quadrant abdominal abscess  Subjective: Pt feeling better today; still has some epigastric discomfort and occ nausea; sitting up in chair; has passed some flatus   Allergies: Review of patient's allergies indicates no known allergies.  Medications: Prior to Admission medications   Medication Sig Start Date End Date Taking? Authorizing Provider  calcium carbonate (TUMS - DOSED IN MG ELEMENTAL CALCIUM) 500 MG chewable tablet Chew 2 tablets by mouth daily.    Yes Historical Provider, MD  dexamethasone (DECADRON) 4 MG tablet Take 2 tablets by mouth once a day on the day after chemotherapy and then take 2 tablets two times a day for 2 days. Take with food. 07/27/15  Yes Lennis Marion Downer, MD  docusate sodium (COLACE) 100 MG capsule Take 100 mg by mouth 2 (two) times daily as needed for mild constipation. Reported on 05/28/2015 07/17/14  Yes Historical Provider, MD  hydrocortisone 2.5 % cream Apply 1 application topically as needed (itch/psoriasis.). Reported on 07/17/2015 05/19/14  Yes Historical Provider, MD  lidocaine-prilocaine (EMLA) cream Apply to Porta-cath 1-2 hrs prior to access. Cover with [employer]. 08/18/14  Yes Lennis Marion Downer, MD  LORazepam (ATIVAN) 1 MG tablet Place 1/2 to 1 tablet under the tongue or swallow every 6 hours as needed for nausea 07/22/15  Yes Lennis P Livesay, MD  meloxicam (MOBIC) 15 MG tablet Take 15 mg by mouth daily.  06/09/14  Yes Historical Provider, MD  ondansetron (ZOFRAN) 8 MG tablet Take 1 tablet (8 mg total) by mouth every 8 (eight) hours as needed for nausea or vomiting. 07/22/15  Yes Lennis Marion Downer, MD  prochlorperazine (COMPAZINE) 10 MG tablet Take 1 tablet (10 mg total) by mouth every 6 (six) hours as needed (Nausea or vomiting). 07/27/15  Yes Lennis Marion Downer, MD  Psyllium 28.3 % POWD Take 1 packet  by mouth daily as needed (for fiber). Reported on 05/26/2015   Yes Historical Provider, MD  Resveratrol 250 MG CAPS Take 250 mg by mouth 2 (two) times daily. Reported on 05/26/2015   Yes Historical Provider, MD  ferrous fumarate (HEMOCYTE) 325 (106 FE) MG TABS tablet Take 1 tab daily on an empty stomach with OJ or Vitamin C 500 mg Patient not taking: Reported on 07/09/2015 09/15/14   Lennis Marion Downer, MD  sucralfate (CARAFATE) 1 GM/10ML suspension Take 10 mLs (1 g total) by mouth 4 (four) times daily -  with meals and at bedtime. Patient not taking: Reported on 07/30/2015 05/20/15   [name], MD     Vital Signs: BP 114/43 mmHg  Pulse 84  Temp(Src) 98.5 F (36.9 C) (Oral)  Resp 21  Ht 5' (1.524 m)  Wt 227 lb 4.7 oz (103.1 kg)  BMI 44.39 kg/m2  SpO2 97%  LMP 02/04/2014  Physical Exam RLQ drain intact, dressing dry, site NT, output 145 cc purulent bloody fluid, cx's pend; drain irrigated with 10 cc sterile NS  Imaging: Ct Abdomen Pelvis W Contrast  07/30/2015  CLINICAL DATA:  Acute onset of abdominal pain today. Diarrhea. History of metastatic endometrial carcinoma. EXAM: CT ABDOMEN AND PELVIS WITH CONTRAST TECHNIQUE: Multidetector CT imaging of the abdomen and pelvis was performed using the standard protocol following bolus administration of intravenous contrast. CONTRAST:  137m OMNIPAQUE IOHEXOL 300 MG/ML  SOLN COMPARISON:  07/21/2015. FINDINGS: Lower chest: Significant bibasilar atelectasis. No  pleural effusion. No pericardial effusion. The distal esophagus is grossly normal. 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 Hepatobiliary: No focal hepatic lesions or intrahepatic biliary dilatation. The gallbladder is normal. No common bile duct dilatation. Pancreas: No mass, inflammation or ductal dilatation. Spleen: Normal size.  No focal lesions. Adrenals/Urinary Tract: The adrenal glands and kidneys are unremarkable and stable. Stomach/Bowel: Surgical changes involving the stomach  with gastric sleeve procedure. The duodenum is unremarkable. The proximal and mid small bowel is unremarkable. There is moderate inflammation in falling the terminal and distal ileum. On the prior CT scan there was a 5 cm "Mass" near the cecum. This now contains air and contrast suggesting that there has been erosion into the small bowel or a walled-off small bowel perforation. In retrospect I am not sure that at the pericecal lesion was actually nodal disease and may have been a complex fluid collection at that point that has now fistualized. Also, if this was a solid mass that had necrosis and eroded into the small bowel I would not expect to see air throughout the entire area. Either way, this is a bowel perforation and abscess and need to be drained. There is also moderate fluid in the small amount of air in the pelvis which may communicate. Vascular/Lymphatic: The nodal disease in the abdomen/pelvis appears relatively stable since 2 weeks ago. The periduodenal lesion on image 30 measures 12 mm. Retroperitoneal lesion on image 45 measures 37.5 mm. The left-sided retroperitoneal nodal lesion on image number 52 measures 21.5 mm. Left pelvic nodal lesion on image number 72 measures 20.5 mm. Other: Status post hysterectomy.  No inguinal adenopathy. Musculoskeletal: No significant bony findings. IMPRESSION: CT findings consistent with a bowel perforation. There is a large (9 x 4.5 cm) complex right lower quadrant and pelvic abscess containing a small amount of contrast suggesting communication with the distal ileum. This could be due to tumor erosion as discussed above. Stable adenopathy in the abdomen/pelvis. These results were called by telephone at the time of interpretation on 07/30/2015 at 3:41 pm to Dr. Margarita Mail , who verbally acknowledged these results. Electronically Signed   By: Marijo Sanes M.D.   On: 07/30/2015 15:41   Dg Abd Acute W/chest  07/30/2015  CLINICAL DATA:  Shortness of breath today,  history endometrial cancer, diverticulosis, abdominal pain, cold sensation, blood in diarrhea, urinary incontinence, post chemotherapy and radiation therapy EXAM: DG ABDOMEN ACUTE W/ 1V CHEST COMPARISON:  Chest radiograph 11/10/2014, CT chest abdomen and pelvis 07/21/2015 FINDINGS: RIGHT jugular Port-A-Cath with tip projecting over SVC above cavoatrial junction. Minimal enlargement of cardiac silhouette. Mild tortuosity of thoracic aorta. Mediastinal contours and pulmonary vascularity otherwise normal. Bibasilar atelectasis. Lungs otherwise clear. No pleural effusion or pneumothorax. Retained contrast within a few small bowel loops in the mid abdomen which appear prominent, raising question of small bowel obstruction. These small bowel loops appear more prominent than were seen on the recent CT. No definite free intraperitoneal air. Prior gastric surgery. Bones demineralized. IMPRESSION: Dilated small bowel loops containing residual contrast in the mid abdomen cold most likely representing a component of small bowel obstruction. Bibasilar atelectasis. Electronically Signed   By: Lavonia Dana M.D.   On: 07/30/2015 13:16   Ct Image Guided Drainage By Percutaneous Catheter  07/31/2015  INDICATION: Right lower quadrant abdominal abscess, bowel perforation EXAM: CT GUIDED DRAINAGE OF RIGHT LOWER QUADRANT ABDOMINAL ABSCESS MEDICATIONS: The patient is currently admitted to the hospital and receiving intravenous antibiotics. The antibiotics were administered within  an appropriate time frame prior to the initiation of the procedure. ANESTHESIA/SEDATION: 1.0 mg IV Versed 50 mcg IV Fentanyl Moderate Sedation Time:  24 MINUTES The patient was continuously monitored during the procedure by the interventional radiology nurse under my direct supervision. COMPLICATIONS: None immediate. TECHNIQUE: Informed written consent was obtained from the patient after a thorough discussion of the procedural risks, benefits and alternatives.  All questions were addressed. Maximal Sterile Barrier Technique was utilized including caps, mask, sterile gowns, sterile gloves, sterile drape, hand hygiene and skin antiseptic. A timeout was performed prior to the initiation of the procedure. PROCEDURE: The right lower quadrant was prepped with ChloraPrep in a sterile fashion, and a sterile drape was applied covering the operative field. A sterile gown and sterile gloves were used for the procedure. Local anesthesia was provided with 1% Lidocaine. Previous imaging reviewed. Patient positioned supine. Noncontrast localization CT performed. The right lower quadrant air-fluid collection adjacent to the terminal ileum was localized. Under sterile conditions and local anesthesia, an 18 gauge 15 cm access needle was advanced from a right lower quadrant lateral approach into the air-fluid collection. Guidewire inserted. position confirmed with serial CT. Tract dilatation performed to insert a 10 [place] drain. Catheter position confirmed with CT. Syringe aspiration yielded bloody exudative malodorous fluid. Sample sent for Gram stain and culture. Catheter secured with a Prolene suture and connected to external suction bulb. No immediate complication. Patient tolerated the procedure well. Sterile dressing applied. FINDINGS: CT imaging confirms needle access of the right lower quadrant abscess for drain insertion IMPRESSION: Successful CT-guided right lower quadrant abscess drain insertion as above. Electronically Signed   By: Jerilynn Mages.  Shick M.D.   On: 07/31/2015 16:16    Labs:  CBC:  Recent Labs  07/17/15 1058 07/30/15 1251 07/31/15 0428 08/01/15 0509  WBC 5.2 11.1* 7.7 9.1  HGB 12.3 13.8 10.5* 9.7*  HCT 37.9 42.5 31.8* 29.9*  PLT 245 191 181 196    COAGS:  Recent Labs  08/26/14 1300 07/30/15 2242 07/31/15 0428  INR 0.95 1.49 1.41  APTT 27  --  32    BMP:  Recent Labs  09/18/14 1039  07/17/15 1058 07/30/15 1251 07/31/15 0428 07/31/15 1630  08/01/15 0509  NA 139  < > 140 133* 136  --  138  K 4.7  < > 4.1 3.5 3.3* 4.1 3.8  CL 108  --   --  102 106  --  110  CO2 26  < > 23 19* 22  --  18*  GLUCOSE 99  < > 93 125* 96  --  68  BUN 21  < > 18.4 29* 16  --  13  CALCIUM 8.8  < > 9.2 8.5* 7.6*  --  7.9*  CREATININE 0.76  < > 0.8 1.06* 0.72  --  0.68  GFRNONAA >90  --   --  56* >60  --  >60  GFRAA >90  --   --  >60 >60  --  >60  < > = values in this interval not displayed.  LIVER FUNCTION TESTS:  Recent Labs  07/17/15 1058 07/30/15 1251 07/31/15 0428 08/01/15 0509  BILITOT 0.85 3.4* 2.4* 2.9*  AST '18 23 16 '$ 13*  ALT 12 13* 11* 9*  ALKPHOS 87 60 48 50  PROT 7.1 7.5 5.6* 5.5*  ALBUMIN 3.5 3.9 2.7* 2.4*    Assessment and Plan: Met endom ca; s/p drainage of RLQ abd abscess 2/24; check final cx's; AF; WBC /creat nl; hgb  9.7; send fluid for cytology; cont drain irrigation; check f/u CT next week- may also need injection study before removal   Electronically Signed: D. Rowe Robert 08/01/2015, 8:57 AM   I spent a total of 15 minutes at the the patient's bedside AND on the patient's hospital floor or unit, greater than 50% of which was counseling/coordinating care for RLQ abdominal abscess drain

## 2015-08-02 DIAGNOSIS — Z6841 Body Mass Index (BMI) 40.0 and over, adult: Secondary | ICD-10-CM

## 2015-08-02 LAB — BASIC METABOLIC PANEL
Anion gap: 10 (ref 5–15)
BUN: 11 mg/dL (ref 6–20)
CHLORIDE: 104 mmol/L (ref 101–111)
CO2: 26 mmol/L (ref 22–32)
Calcium: 8.1 mg/dL — ABNORMAL LOW (ref 8.9–10.3)
Creatinine, Ser: 0.7 mg/dL (ref 0.44–1.00)
GFR calc Af Amer: >60 mL/min (ref >60)
GFR calc non Af Amer: >60 mL/min (ref >60)
GLUCOSE: 89 mg/dL (ref 65–99)
POTASSIUM: 3.2 mmol/L — AB (ref 3.5–5.1)
Sodium: 140 mmol/L (ref 135–145)

## 2015-08-02 LAB — CBC
HCT: 30.1 % — ABNORMAL LOW (ref 36.0–46.0)
HEMOGLOBIN: 9.9 g/dL — AB (ref 12.0–15.0)
MCH: 28 pg (ref 26.0–34.0)
MCHC: 32.9 g/dL (ref 30.0–36.0)
MCV: 85.3 fL (ref 78.0–100.0)
Platelets: 230 10*3/uL (ref 150–400)
RBC: 3.53 MIL/uL — AB (ref 3.87–5.11)
RDW: 14.4 % (ref 11.5–15.5)
WBC: 10 10*3/uL (ref 4.0–10.5)

## 2015-08-02 LAB — MAGNESIUM: MAGNESIUM: 1.7 mg/dL (ref 1.7–2.4)

## 2015-08-02 MED ORDER — POTASSIUM CHLORIDE 10 MEQ/100ML IV SOLN
10.0000 meq | INTRAVENOUS | Status: AC
  Administered 2015-08-02 (×4): 10 meq via INTRAVENOUS
  Filled 2015-08-02 (×4): qty 100

## 2015-08-02 MED ORDER — SODIUM CHLORIDE 0.9 % IV SOLN
INTRAVENOUS | Status: DC
  Administered 2015-08-02 – 2015-08-03 (×2): via INTRAVENOUS
  Filled 2015-08-02 (×4): qty 1000

## 2015-08-02 MED ORDER — MAGNESIUM SULFATE 4 GM/100ML IV SOLN
4.0000 g | Freq: Once | INTRAVENOUS | Status: AC
  Administered 2015-08-02: 4 g via INTRAVENOUS
  Filled 2015-08-02: qty 100

## 2015-08-02 MED ORDER — POTASSIUM CHLORIDE CRYS ER 20 MEQ PO TBCR
40.0000 meq | EXTENDED_RELEASE_TABLET | Freq: Once | ORAL | Status: AC
  Administered 2015-08-02: 40 meq via ORAL
  Filled 2015-08-02: qty 2

## 2015-08-02 NOTE — Progress Notes (Signed)
PT Cancellation Note  Patient Details Name: Erin Santiago MRN: [id number] DOB: 26-Jul-1955   Cancelled Treatment:    Reason Eval/Treat Not Completed: Medical issues which prohibited therapy, patient reports that shwe is having more discomfort in abdomen and wants to speak to  MD. States that she will get up  Later with nursing and is not ready to ambulate. Patient reports that she lives alone. Asked this PT when she might go home. Encouraged patient that she will need to be more mobile. My pager number is on board in room if she decides to ambulate today. O/W, PT will check back tomorrow.   [name] 08/02/2015, 9:05 AM Tresa Endo PT [phone]

## 2015-08-02 NOTE — Progress Notes (Signed)
OT Cancellation Note  Patient Details Name: Juleanna Isom MRN: [id number] DOB: 03-24-1956   Cancelled Treatment:    Reason Eval/Treat Not Completed: Medical issues which prohibited therapy -- patient reports that she is having more discomfort in abdomen and wants to speak to MD.  Donnetta Hutching, Fallyn Munnerlyn A 08/02/2015, 10:40 AM

## 2015-08-02 NOTE — Progress Notes (Signed)
Patient ID: Erin Santiago, female   DOB: [birth year], 60 y.o.   MRN: [id number] Patient ID: Erin Santiago, female   DOB: 1956/02/08, 60 y.o.   MRN: [id number]    Subjective: Feels better. Less pain. No appetite. Had one fever spike yesterday.  Objective: Vital signs in last 24 hours: Temp:  [98.4 F (36.9 C)-102.6 F (39.2 C)] 98.7 F (37.1 C) (02/26 0732) Pulse Rate:  [77-98] 77 (02/26 0800) Resp:  [0-31] 17 (02/26 0800) BP: (101-127)/(38-65) 125/50 mmHg (02/26 0800) SpO2:  [88 %-100 %] 94 % (02/26 0800) Weight:  [103.5 kg (228 lb 2.8 oz)] 103.5 kg (228 lb 2.8 oz) (02/26 0400) Last BM Date: 07/30/15  Intake/Output from previous day: 02/25 0701 - 02/26 0700 In: 3301.7 [P.O.:700; I.V.:2426.7; IV Piggyback:150] Out: 1085 [Urine:950; Drains:135] Intake/Output this shift: Total I/O In: 200 [I.V.:100; IV Piggyback:100] Out: -   General appearance: alert, cooperative and no distress GI: minimal tenderness much improved from admission. Nondistended. Drain in place with output now appearing bilious  Lab Results:   Recent Labs  08/01/15 2050 08/02/15 0430  WBC 10.4 10.0  HGB 9.8* 9.9*  HCT 29.8* 30.1*  PLT 225 230   BMET  Recent Labs  08/01/15 0509 08/02/15 0430  NA 138 140  K 3.8 3.2*  CL 110 104  CO2 18* 26  GLUCOSE 68 89  BUN 13 11  CREATININE 0.68 0.70  CALCIUM 7.9* 8.1*     Studies/Results: Ct Image Guided Drainage By Percutaneous Catheter  07/31/2015  INDICATION: Right lower quadrant abdominal abscess, bowel perforation EXAM: CT GUIDED DRAINAGE OF RIGHT LOWER QUADRANT ABDOMINAL ABSCESS MEDICATIONS: The patient is currently admitted to the hospital and receiving intravenous antibiotics. The antibiotics were administered within an appropriate time frame prior to the initiation of the procedure. ANESTHESIA/SEDATION: 1.0 mg IV Versed 50 mcg IV Fentanyl Moderate Sedation Time:  24 MINUTES The patient was continuously monitored during the procedure by the interventional  radiology nurse under my direct supervision. COMPLICATIONS: None immediate. TECHNIQUE: Informed written consent was obtained from the patient after a thorough discussion of the procedural risks, benefits and alternatives. All questions were addressed. Maximal Sterile Barrier Technique was utilized including caps, mask, sterile gowns, sterile gloves, sterile drape, hand hygiene and skin antiseptic. A timeout was performed prior to the initiation of the procedure. PROCEDURE: The right lower quadrant was prepped with ChloraPrep in a sterile fashion, and a sterile drape was applied covering the operative field. A sterile gown and sterile gloves were used for the procedure. Local anesthesia was provided with 1% Lidocaine. Previous imaging reviewed. Patient positioned supine. Noncontrast localization CT performed. The right lower quadrant air-fluid collection adjacent to the terminal ileum was localized. Under sterile conditions and local anesthesia, an 18 gauge 15 cm access needle was advanced from a right lower quadrant lateral approach into the air-fluid collection. Guidewire inserted. position confirmed with serial CT. Tract dilatation performed to insert a 10 [place] drain. Catheter position confirmed with CT. Syringe aspiration yielded bloody exudative malodorous fluid. Sample sent for Gram stain and culture. Catheter secured with a Prolene suture and connected to external suction bulb. No immediate complication. Patient tolerated the procedure well. Sterile dressing applied. FINDINGS: CT imaging confirms needle access of the right lower quadrant abscess for drain insertion IMPRESSION: Successful CT-guided right lower quadrant abscess drain insertion as above. Electronically Signed   By: Jerilynn Mages.  Shick M.D.   On: 07/31/2015 16:16    Anti-infectives: Anti-infectives    Start     Dose/Rate Route  Frequency Ordered Stop   07/30/15 1645  piperacillin-tazobactam (ZOSYN) IVPB 3.375 g     3.375 g 12.5 mL/hr over 240  Minutes Intravenous 3 times per day 07/30/15 1634     07/30/15 1545  piperacillin-tazobactam (ZOSYN) IVPB 3.375 g     3.375 g 100 mL/hr over 30 Minutes Intravenous  Once 07/30/15 1541 07/30/15 1757      Assessment/Plan: Metastatic endometrial cancer with right lower quadrant abscess, presumed tumor erosion into bowel. History of radical hysterectomy, pelvic radiation and chemotherapy Appears much improved on antibiotics and status post percutaneous drainage. Continue current treatment. Drainage now probably enteric. Continue clear liquid diet. Repeat CT later this week. May ultimately require surgery for a fistula.    LOS: 3 days    Erin Santiago 08/02/2015

## 2015-08-02 NOTE — Progress Notes (Addendum)
Pharmacy Antibiotic Note  Erin Santiago is a 60 y.o. female admitted on 07/30/2015 with intra-abdominal infection.  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 She is status post chemotherapy and radiation.  Presents with abdominal pain, fevers, N/V/D.  LA elevated.  CT of abdomen and pelvis revealed bowel perforation with a large 9.4cm complex fluid collection in the RLQ, possible erosion of tumor.  Pharmacy has been consulted for Zosyn dosing. No plans for surgical intervention at this time but may need fistula repair at some point.  Likely repeat CT abd later this week per notes.   Day #4 pip/tazo   Plan: Zosyn 3.375g IV q8h (4 hour infusion).  - culture result with GNR from abscess. Waiting final identification Will sign off as do not anticipate need for renal adjustment.   Height: 5' (152.4 cm) Weight: 228 lb 2.8 oz (103.5 kg) IBW/kg (Calculated) : 45.5  Temp (24hrs), Avg:99.8 F (37.7 C), Min:98.7 F (37.1 C), Max:102.6 F (39.2 C)   Recent Labs Lab 07/30/15 1251 07/30/15 1305 07/30/15 1431 07/31/15 0428 08/01/15 0509 08/01/15 2050 08/02/15 0430  WBC 11.1*  --   --  7.7 9.1 10.4 10.0  CREATININE 1.06*  --   --  0.72 0.68  --  0.70  LATICACIDVEN  --  2.67* 0.93 0.7  --   --   --     Estimated Creatinine Clearance: 81.1 mL/min (by C-G formula based on Cr of 0.7).    No Known Allergies  Antimicrobials this admission: 2/23 Zosyn >>   Dose adjustments this admission: -  Microbiology results: 2/23 BCx: collected 2/23 UCx: collected   Thank you for allowing pharmacy to be a part of this patient's care.  Doreene Eland, PharmD, BCPS.   Pager: [phone] 08/02/2015 12:09 PM

## 2015-08-02 NOTE — Progress Notes (Signed)
[facility] PROGRESS NOTE  Chardai Overcash [id number] DOB: 03/01/1956 DOA: 07/30/2015 PCP: Merrilee Seashore, MD  Assessment/Plan: #1 bowel perforation, complex intra-abdominal fluid collection 9 x 4.5 cm right lower quadrant and pelvic Questionable etiology.  Clinical improvement.Possible invasion of cancer/ metastatic endometrial cancer into bowel. Per CT abdomen and pelvis looks like perforation may have been walled off and contained. Blood cultures pending. Patient currently afebrile. Leukocytosis trending down. Continue empiric IV Zosyn. Patient has been seen in consultation by general surgery will recommending nonoperative percutaneous drainage placement. S/p RLQ percutaneous drain 07/31/2015 per IR.  Wound/abscess cultures pending. Patient has been started on clear liquids per general surgery. If patient shows any signs of deterioration patient likely needs emergency laparotomy. Patient likely needs repeat CT next week. General surgery following.  #2 leukocytosis Likely secondary to problem #1. Blood cultures pending. Urinalysis nitrite negative leukocytes negative. WBC trending down. Continue empiric IV Zosyn.  #3 history of iron deficiency anemia Hemoglobin dropped to 10.5 from 13.8 on admission. Likely dilutional component as patient on IV fluids.  Hemoglobin currently at 9.9. Follow.  #4 dehydration/meetabolic acidosis  Change IV fluids to NS, as acidosis resolved.  #5 hypokalemia Replete.Keep magnesium > 2.  #6 lactic acidosis Likely secondary to problem #1 and dehydration. Lactic acid levels trending down. Patient has been pancultured. Continue empiric IV Zosyn.  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 Patient status post chemotherapy and radiation to the pelvis. Patient was to start chemotherapy on the day of admission due to progression  of disease involving the left, iliac adenopathy, 2 masses in the right lower quadrant seen on CT scan on 07/21/2015.  Oncology following.  #8 prophylaxis SCDs for DVT prophylaxis.   Code Status: Full Family Communication: Updated patient. No family at bedside. Disposition Plan: Remain in the step down unit.   Consultants:  Gen. surgery: Dr. Excell Seltzer 07/30/2015   oncology: Dr. Marko Plume 07/31/2015  Procedures:  CT abdomen and pelvis 07/30/2015  Acute abdominal series 07/30/2015  Right lower quadrant abdominal abscess drain with CT 07/31/2015 for Dr. Annamaria Boots  Antibiotics:  IV Zosyn 07/30/2015  HPI/Subjective: Patient states abdominal pain improving.Patient denies CP. No SOB. Patient passing flatus. No BM.  Objective: Filed Vitals:   08/02/15 0732 08/02/15 0800  BP:  125/50  Pulse:  77  Temp: 98.7 F (37.1 C)   Resp:  17    Intake/Output Summary (Last 24 hours) at 08/02/15 0911 Last data filed at 08/02/15 [id number]  Gross per 24 hour  Intake 2946.67 ml  Output    605 ml  Net 2341.67 ml   Filed Weights   07/30/15 2000 08/01/15 0500 08/02/15 0400  Weight: 100.6 kg (221 lb 12.5 oz) 103.1 kg (227 lb 4.7 oz) 103.5 kg (228 lb 2.8 oz)    Exam:   General:  NAD  Cardiovascular: RRR  Respiratory: Bibasilar crackles  Abdomen: Soft, less diffuse tenderness to palpation right side greater than left, + bowel sounds, mild distention. JP drain with serosanginous fluid.  Musculoskeletal: No clubbing cyanosis or edema.   Data Reviewed: Basic Metabolic Panel:  Recent Labs Lab 07/30/15 1251 07/30/15 1817 07/31/15 0428 07/31/15 1630 08/01/15 0509 08/02/15 0430  NA 133*  --  136  --  138 140  K 3.5  --  3.3* 4.1 3.8 3.2*  CL 102  --  106  --  110 104  CO2 19*  --  22  --  18* 26  GLUCOSE 125*  --  96  --  68 89  BUN 29*  --  16  --  13 11  CREATININE 1.06*  --  0.72  --  0.68 0.70  CALCIUM 8.5*  --  7.6*  --  7.9* 8.1*  MG  --  1.7 2.5*  --   --  1.7   Liver  Function Tests:  Recent Labs Lab 07/30/15 1251 07/31/15 0428 08/01/15 0509  AST 23 16 13*  ALT 13* 11* 9*  ALKPHOS 60 48 50  BILITOT 3.4* 2.4* 2.9*  PROT 7.5 5.6* 5.5*  ALBUMIN 3.9 2.7* 2.4*    Recent Labs Lab 07/30/15 1251  LIPASE 18   No results for input(s): AMMONIA in the last 168 hours. CBC:  Recent Labs Lab 07/30/15 1251 07/31/15 0428 08/01/15 0509 08/01/15 2050 08/02/15 0430  WBC 11.1* 7.7 9.1 10.4 10.0  NEUTROABS  --   --  8.0* 8.9*  --   HGB 13.8 10.5* 9.7* 9.8* 9.9*  HCT 42.5 31.8* 29.9* 29.8* 30.1*  MCV 85.0 85.3 86.4 85.1 85.3  PLT 191 181 196 225 230   Cardiac Enzymes: No results for input(s): CKTOTAL, CKMB, CKMBINDEX, TROPONINI in the last 168 hours. BNP (last 3 results) No results for input(s): BNP in the last 8760 hours.  ProBNP (last 3 results) No results for input(s): PROBNP in the last 8760 hours.  CBG: No results for input(s): GLUCAP in the last 168 hours.  Recent Results (from the past 240 hour(s))  Blood culture (routine x 2)     Status: None (Preliminary result)   Collection Time: 07/30/15  6:31 PM  Result Value Ref Range Status   Specimen Description BLOOD RIGHT PORT  Final   Special Requests BOTTLES DRAWN AEROBIC AND ANAEROBIC 5CC EACH  Final   Culture   Final    NO GROWTH 2 DAYS Performed at [facility]    Report Status PENDING  Incomplete  Blood culture (routine x 2)     Status: None (Preliminary result)   Collection Time: 07/30/15  6:33 PM  Result Value Ref Range Status   Specimen Description BLOOD LEFT WRIST  Final   Special Requests BOTTLES DRAWN AEROBIC AND ANAEROBIC 5CC EACH  Final   Culture   Final    NO GROWTH 2 DAYS Performed at [facility]    Report Status PENDING  Incomplete  Culture, Urine     Status: None   Collection Time: 07/30/15  7:36 PM  Result Value Ref Range Status   Specimen Description URINE, RANDOM  Final   Special Requests NONE  Final   Culture   Final    3,000 COLONIES/mL  INSIGNIFICANT GROWTH Performed at [facility]    Report Status 07/31/2015 FINAL  Final  MRSA PCR Screening     Status: None   Collection Time: 07/30/15  8:11 PM  Result Value Ref Range Status   MRSA by PCR NEGATIVE NEGATIVE Final    Comment:        The GeneXpert MRSA Assay (FDA approved for NASAL specimens only), is one component of a comprehensive MRSA colonization surveillance program. It is not intended to diagnose MRSA infection nor to guide or monitor treatment for MRSA infections.   Culture, routine-abscess     Status: None (Preliminary result)   Collection Time: 07/31/15  3:51 PM  Result Value Ref Range Status   Specimen Description ABSCESS RIGHT LOWER ABDOMINAL QUADRANT  Final   Special  Requests Normal  Final   Gram Stain   Final    FEW WBC PRESENT,BOTH PMN AND MONONUCLEAR NO SQUAMOUS EPITHELIAL CELLS SEEN FEW GRAM NEGATIVE RODS Performed at [facility]    Culture   Final    MODERATE GRAM NEGATIVE RODS Performed at [facility]    Report Status PENDING  Incomplete     Studies: Ct Image Guided Drainage By Percutaneous Catheter  07/31/2015  INDICATION: Right lower quadrant abdominal abscess, bowel perforation EXAM: CT GUIDED DRAINAGE OF RIGHT LOWER QUADRANT ABDOMINAL ABSCESS MEDICATIONS: The patient is currently admitted to the hospital and receiving intravenous antibiotics. The antibiotics were administered within an appropriate time frame prior to the initiation of the procedure. ANESTHESIA/SEDATION: 1.0 mg IV Versed 50 mcg IV Fentanyl Moderate Sedation Time:  24 MINUTES The patient was continuously monitored during the procedure by the interventional radiology nurse under my direct supervision. COMPLICATIONS: None immediate. TECHNIQUE: Informed written consent was obtained from the patient after a thorough discussion of the procedural risks, benefits and alternatives. All questions were addressed. Maximal Sterile Barrier Technique was utilized  including caps, mask, sterile gowns, sterile gloves, sterile drape, hand hygiene and skin antiseptic. A timeout was performed prior to the initiation of the procedure. PROCEDURE: The right lower quadrant was prepped with ChloraPrep in a sterile fashion, and a sterile drape was applied covering the operative field. A sterile gown and sterile gloves were used for the procedure. Local anesthesia was provided with 1% Lidocaine. Previous imaging reviewed. Patient positioned supine. Noncontrast localization CT performed. The right lower quadrant air-fluid collection adjacent to the terminal ileum was localized. Under sterile conditions and local anesthesia, an 18 gauge 15 cm access needle was advanced from a right lower quadrant lateral approach into the air-fluid collection. Guidewire inserted. position confirmed with serial CT. Tract dilatation performed to insert a 10 [place] drain. Catheter position confirmed with CT. Syringe aspiration yielded bloody exudative malodorous fluid. Sample sent for Gram stain and culture. Catheter secured with a Prolene suture and connected to external suction bulb. No immediate complication. Patient tolerated the procedure well. Sterile dressing applied. FINDINGS: CT imaging confirms needle access of the right lower quadrant abscess for drain insertion IMPRESSION: Successful CT-guided right lower quadrant abscess drain insertion as above. Electronically Signed   By: Jerilynn Mages.  Shick M.D.   On: 07/31/2015 16:16    Scheduled Meds: . magnesium sulfate 1 - 4 g bolus IVPB  4 g Intravenous Once  . piperacillin-tazobactam (ZOSYN)  IV  3.375 g Intravenous 3 times per day  . potassium chloride  10 mEq Intravenous Q1 Hr x 4  . sodium chloride flush  3 mL Intravenous Q12H   Continuous Infusions: .  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 If 7PM-7AM, please contact night-coverage at [email or web address], password [facility] 08/02/2015, 9:11 AM  LOS: 3 days

## 2015-08-03 ENCOUNTER — Ambulatory Visit: Payer: Federal, State, Local not specified - PPO | Admitting: Oncology

## 2015-08-03 ENCOUNTER — Other Ambulatory Visit: Payer: Federal, State, Local not specified - PPO

## 2015-08-03 ENCOUNTER — Ambulatory Visit: Payer: Federal, State, Local not specified - PPO

## 2015-08-03 ENCOUNTER — Encounter: Payer: Self-pay | Admitting: Oncology

## 2015-08-03 LAB — COMPREHENSIVE METABOLIC PANEL
ALBUMIN: 2.3 g/dL — AB (ref 3.5–5.0)
ALK PHOS: 52 U/L (ref 38–126)
ALT: 8 U/L — AB (ref 14–54)
ANION GAP: 9 (ref 5–15)
AST: 11 U/L — AB (ref 15–41)
BUN: 8 mg/dL (ref 6–20)
CALCIUM: 7.7 mg/dL — AB (ref 8.9–10.3)
CO2: 25 mmol/L (ref 22–32)
Chloride: 103 mmol/L (ref 101–111)
Creatinine, Ser: 0.6 mg/dL (ref 0.44–1.00)
GFR calc Af Amer: >60 mL/min (ref >60)
GFR calc non Af Amer: >60 mL/min (ref >60)
GLUCOSE: 82 mg/dL (ref 65–99)
Potassium: 4.4 mmol/L (ref 3.5–5.1)
SODIUM: 137 mmol/L (ref 135–145)
Total Bilirubin: 2.8 mg/dL — ABNORMAL HIGH (ref 0.3–1.2)
Total Protein: 5.4 g/dL — ABNORMAL LOW (ref 6.5–8.1)

## 2015-08-03 LAB — CBC
HCT: 29.8 % — ABNORMAL LOW (ref 36.0–46.0)
HEMOGLOBIN: 9.9 g/dL — AB (ref 12.0–15.0)
MCH: 28.6 pg (ref 26.0–34.0)
MCHC: 33.2 g/dL (ref 30.0–36.0)
MCV: 86.1 fL (ref 78.0–100.0)
Platelets: 262 10*3/uL (ref 150–400)
RBC: 3.46 MIL/uL — ABNORMAL LOW (ref 3.87–5.11)
RDW: 14.7 % (ref 11.5–15.5)
WBC: 8.7 10*3/uL (ref 4.0–10.5)

## 2015-08-03 LAB — CULTURE, ROUTINE-ABSCESS: Special Requests: NORMAL

## 2015-08-03 LAB — MAGNESIUM: Magnesium: 2 mg/dL (ref 1.7–2.4)

## 2015-08-03 MED ORDER — OXYCODONE-ACETAMINOPHEN 5-325 MG PO TABS
1.0000 | ORAL_TABLET | ORAL | Status: DC | PRN
  Administered 2015-08-06: 1 via ORAL
  Filled 2015-08-03: qty 1

## 2015-08-03 MED ORDER — CETYLPYRIDINIUM CHLORIDE 0.05 % MT LIQD
7.0000 mL | Freq: Two times a day (BID) | OROMUCOSAL | Status: DC
  Administered 2015-08-03 – 2015-08-27 (×40): 7 mL via OROMUCOSAL

## 2015-08-03 MED ORDER — MORPHINE SULFATE (PF) 2 MG/ML IV SOLN
1.0000 mg | INTRAVENOUS | Status: DC | PRN
  Administered 2015-08-03 – 2015-08-04 (×3): 1 mg via INTRAVENOUS
  Filled 2015-08-03 (×3): qty 1

## 2015-08-03 NOTE — Evaluation (Signed)
Physical Therapy Evaluation Patient Details Name: Erin Santiago MRN: [id number] DOB: 24-Oct-1955 Today's Date: 08/03/2015   History of Present Illness  60 yo female admitted 2/23 with abdominal pain, h/p endomentrial stromal sarcome, underwent surgery in February. S/P palcement  of drai for abcess.  Clinical Impression  The patient is very encouraged that she  Is able to ambulate. Reports the R kne is better. Patient plans to DC to home. May need a RW. Pt admitted with above diagnosis. Pt currently with functional limitations due to the deficits listed below (see PT Problem List).  Pt will benefit from skilled PT to increase their independence and safety with mobility to allow discharge to home.     Follow Up Recommendations Home health PT    Equipment Recommendations  Rolling walker with 5" wheels;3in1 (PT)    Recommendations for Other Services       Precautions / Restrictions Precautions Precautions: Fall Precaution Comments: R knee twisted PTA. R drain in abd.      Mobility  Bed Mobility Overal bed mobility: Needs Assistance Bed Mobility: Sit to Supine       Sit to supine: Mod assist   General bed mobility comments: assist with the legs onto the bed  Transfers Overall transfer level: Needs assistance Equipment used: Rolling walker (2 wheeled) Transfers: Sit to/from Stand Sit to Stand: Supervision            Ambulation/Gait Ambulation/Gait assistance: Min guard Ambulation Distance (Feet): 150 Feet Assistive device: Rolling walker (2 wheeled) Gait Pattern/deviations: Step-through pattern     General Gait Details: stopped several times  to stretch and rest standing  Stairs            Wheelchair Mobility    Modified Rankin (Stroke Patients Only)       Balance                                             Pertinent Vitals/Pain Pain Assessment: 0-10 Pain Score: 3  Pain Location: abdomen, relieved with ambulation Pain  Descriptors / Indicators: Discomfort    Home Living Family/patient expects to be discharged to:: Private residence Living Arrangements: Alone Available Help at Discharge: Friend(s);Available PRN/intermittently Type of Home: Mobile home Home Access: Ramped entrance     Home Layout: One level Home Equipment: Shower seat Additional Comments: reports thatt she sleeps  inrecliner, has life alert in bathroom and a remote    Prior Function Level of Independence: Independent         Comments: Pt drives, performs all IADLs. . 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      Hand Dominance        Extremity/Trunk Assessment   Upper Extremity Assessment: Defer to OT evaluation           Lower Extremity Assessment: RLE deficits/detail RLE Deficits / Details: knee flexion to 100*, 4+ knee extension    Cervical / Trunk Assessment: Normal  Communication      Cognition Arousal/Alertness: Awake/alert Behavior During Therapy: WFL for tasks assessed/performed Overall Cognitive Status: Within Functional Limits for tasks assessed                      General Comments      Exercises General Exercises - Lower Extremity Quad Sets: AROM;Both;5 reps;Supine Straight Leg Raises: AAROM; Right;5 reps;Supine (with sheet)  Assessment/Plan    PT Assessment Patient needs continued PT services  PT Diagnosis Difficulty walking;Generalized weakness   PT Problem List Decreased strength;Decreased activity tolerance;Decreased mobility;Decreased knowledge of use of DME;Decreased knowledge of precautions;Decreased safety awareness  PT Treatment Interventions DME instruction;Gait training;Functional mobility training;Therapeutic activities;Therapeutic exercise;Patient/family education   PT Goals (Current goals can be found in the Care Plan section) Acute Rehab PT Goals Patient Stated Goal: to take care of my sheep. PT Goal Formulation: With patient Time For Goal Achievement:  08/17/15 Potential to Achieve Goals: Good    Frequency Min 3X/week   Barriers to discharge        Co-evaluation               End of Session   Activity Tolerance: Patient tolerated treatment well Patient left: in bed;with call bell/phone within reach Nurse Communication: Mobility status         Time: 0210-0232 PT Time Calculation (min) (ACUTE ONLY): 22 min   Charges:   PT Evaluation $PT Eval Low Complexity: 1 Procedure     PT G CodesMarcelino Freestone PT [id number]   08/03/2015, 2:47 PM

## 2015-08-03 NOTE — Progress Notes (Signed)
Date: August 03, 2015 Chart reviewed for concurrent status and case management needs. Will continue to follow patient for changes and needs: Velva Harman, BSN, [place], [place]   [phone]

## 2015-08-03 NOTE — Progress Notes (Signed)
[facility] PROGRESS NOTE  Erin Santiago [id number] DOB: Feb 27, 1956 DOA: 07/30/2015 PCP: Merrilee Seashore, MD  Assessment/Plan: #1 bowel perforation, complex intra-abdominal fluid collection 9 x 4.5 cm right lower quadrant and pelvic Questionable etiology.  Clinical improvement.Possible invasion of cancer/ metastatic endometrial cancer into bowel. Per CT abdomen and pelvis looks like perforation may have been walled off and contained. Blood cultures pending. Patient currently afebrile. Leukocytosis trending down. Continue empiric IV Zosyn. Patient has been seen in consultation by general surgery will recommending nonoperative percutaneous drainage placement. S/p RLQ percutaneous drain 07/31/2015 per IR.  Wound/abscess cultures growing Escherichia coli. Patient has been started on clear liquids per general surgery. If patient shows any signs of deterioration patient likely needs emergency laparotomy. Patient likely needs repeat CT next week. General surgery following.  #2 leukocytosis Likely secondary to problem #1. Blood cultures pending. Urinalysis nitrite negative leukocytes negative. WBC trending down. Abscess cultures growing Escherichia coli which is pansensitive. Continue empiric IV Zosyn.  #3 history of iron deficiency anemia Hemoglobin dropped to 10.5 from 13.8 on admission. Likely dilutional component as patient on IV fluids.  Hemoglobin currently at 9.9. NSL IVF.  Follow.  #4 dehydration/meetabolic acidosis  NSL IVF.  #5 hypokalemia Replete.Keep magnesium > 2.  #6 lactic acidosis Likely secondary to problem #1 and dehydration. Lactic acid levels trending down. Patient has been pancultured. Abscess growing [place] which is pan sensitive. Continue empiric IV Zosyn.  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 Patient status post chemotherapy and  radiation to the pelvis. Patient was to start chemotherapy on the day of admission due to progression of disease involving the left, iliac adenopathy, 2 masses in the right lower quadrant seen on CT scan on 07/21/2015.  Oncology following.  #8 prophylaxis SCDs for DVT prophylaxis.   Code Status: Full Family Communication: Updated patient. No family at bedside. Disposition Plan: Remain in the step down unit.   Consultants:  Gen. surgery: Dr. Excell Seltzer 07/30/2015   oncology: Dr. Marko Plume 07/31/2015  Procedures:  CT abdomen and pelvis 07/30/2015  Acute abdominal series 07/30/2015  Right lower quadrant abdominal abscess drain with CT 07/31/2015 for Dr. Annamaria Boots  Antibiotics:  IV Zosyn 07/30/2015  HPI/Subjective: Patient states abdominal pain improving.Patient denies CP. No SOB. Patient passing flatus. No BM.  Objective: Filed Vitals:   08/03/15 0755 08/03/15 0800  BP:  110/45  Pulse:    Temp:  99.6 F (37.6 C)  Resp: 17 23    Intake/Output Summary (Last 24 hours) at 08/03/15 0848 Last data filed at 08/03/15 0800  Gross per 24 hour  Intake 2661.25 ml  Output    460 ml  Net 2201.25 ml   Filed Weights   07/30/15 2000 08/01/15 0500 08/02/15 0400  Weight: 100.6 kg (221 lb 12.5 oz) 103.1 kg (227 lb 4.7 oz) 103.5 kg (228 lb 2.8 oz)    Exam:   General:  NAD  Cardiovascular: RRR  Respiratory: Bibasilar crackles  Abdomen: Soft, less diffuse tenderness to palpation right side greater than left, + bowel sounds, mild distention. JP drain with serosanginous fluid.  Musculoskeletal: No clubbing cyanosis or edema.   Data Reviewed: Basic Metabolic Panel:  Recent Labs Lab 07/30/15 1251 07/30/15 1817 07/31/15 0428 07/31/15 1630 08/01/15 0509 08/02/15 0430 08/03/15 0425  NA 133*  --  136  --  138 140 137  K 3.5  --  3.3* 4.1 3.8 3.2* 4.4  CL 102  --  106  --  110 104 103  CO2 19*  --  22  --  18* 26 25  GLUCOSE 125*  --  96  --  68 89 82  BUN 29*  --  16  --  13  11 8   CREATININE 1.06*  --  0.72  --  0.68 0.70 0.60  CALCIUM 8.5*  --  7.6*  --  7.9* 8.1* 7.7*  MG  --  1.7 2.5*  --   --  1.7 2.0   Liver Function Tests:  Recent Labs Lab 07/30/15 1251 07/31/15 0428 08/01/15 0509 08/03/15 0425  AST 23 16 13* 11*  ALT 13* 11* 9* 8*  ALKPHOS 60 48 50 52  BILITOT 3.4* 2.4* 2.9* 2.8*  PROT 7.5 5.6* 5.5* 5.4*  ALBUMIN 3.9 2.7* 2.4* 2.3*    Recent Labs Lab 07/30/15 1251  LIPASE 18   No results for input(s): AMMONIA in the last 168 hours. CBC:  Recent Labs Lab 07/31/15 0428 08/01/15 0509 08/01/15 2050 08/02/15 0430 08/03/15 0425  WBC 7.7 9.1 10.4 10.0 8.7  NEUTROABS  --  8.0* 8.9*  --   --   HGB 10.5* 9.7* 9.8* 9.9* 9.9*  HCT 31.8* 29.9* 29.8* 30.1* 29.8*  MCV 85.3 86.4 85.1 85.3 86.1  PLT 181 196 225 230 262   Cardiac Enzymes: No results for input(s): CKTOTAL, CKMB, CKMBINDEX, TROPONINI in the last 168 hours. BNP (last 3 results) No results for input(s): BNP in the last 8760 hours.  ProBNP (last 3 results) No results for input(s): PROBNP in the last 8760 hours.  CBG: No results for input(s): GLUCAP in the last 168 hours.  Recent Results (from the past 240 hour(s))  Blood culture (routine x 2)     Status: None (Preliminary result)   Collection Time: 07/30/15  6:31 PM  Result Value Ref Range Status   Specimen Description BLOOD RIGHT PORT  Final   Special Requests BOTTLES DRAWN AEROBIC AND ANAEROBIC 5CC EACH  Final   Culture   Final    NO GROWTH 3 DAYS Performed at [facility]    Report Status PENDING  Incomplete  Blood culture (routine x 2)     Status: None (Preliminary result)   Collection Time: 07/30/15  6:33 PM  Result Value Ref Range Status   Specimen Description BLOOD LEFT WRIST  Final   Special Requests BOTTLES DRAWN AEROBIC AND ANAEROBIC 5CC EACH  Final   Culture   Final    NO GROWTH 3 DAYS Performed at [facility]    Report Status PENDING  Incomplete  Culture, Urine     Status: None    Collection Time: 07/30/15  7:36 PM  Result Value Ref Range Status   Specimen Description URINE, RANDOM  Final   Special Requests NONE  Final   Culture   Final    3,000 COLONIES/mL INSIGNIFICANT GROWTH Performed at [facility]    Report Status 07/31/2015 FINAL  Final  MRSA PCR Screening     Status: None   Collection Time: 07/30/15  8:11 PM  Result Value Ref Range Status   MRSA by PCR NEGATIVE NEGATIVE Final    Comment:        The GeneXpert MRSA Assay (FDA approved for NASAL specimens only), is one component of a comprehensive MRSA colonization surveillance program. It is not intended to diagnose MRSA infection nor to guide or monitor treatment for MRSA infections.   Culture, routine-abscess     Status:  None   Collection Time: 07/31/15  3:51 PM  Result Value Ref Range Status   Specimen Description ABSCESS RIGHT LOWER ABDOMINAL QUADRANT  Final   Special Requests Normal  Final   Gram Stain   Final    FEW WBC PRESENT,BOTH PMN AND MONONUCLEAR NO SQUAMOUS EPITHELIAL CELLS SEEN FEW GRAM NEGATIVE RODS Performed at [facility]    Culture   Final    MODERATE ESCHERICHIA COLI Performed at [facility]    Report Status 08/03/2015 FINAL  Final   Organism ID, Bacteria ESCHERICHIA COLI  Final      Susceptibility   Escherichia coli - MIC*    AMPICILLIN <=2 SENSITIVE Sensitive     AMPICILLIN/SULBACTAM <=2 SENSITIVE Sensitive     CEFEPIME <=1 SENSITIVE Sensitive     CEFTAZIDIME <=1 SENSITIVE Sensitive     CEFTRIAXONE <=1 SENSITIVE Sensitive     CIPROFLOXACIN <=0.25 SENSITIVE Sensitive     GENTAMICIN <=1 SENSITIVE Sensitive     IMIPENEM <=0.25 SENSITIVE Sensitive     PIP/TAZO <=4 SENSITIVE Sensitive     TOBRAMYCIN <=1 SENSITIVE Sensitive     TRIMETH/SULFA Value in next row Sensitive      <=20 SENSITIVE(NOTE)    * MODERATE ESCHERICHIA COLI  Anaerobic culture     Status: None (Preliminary result)   Collection Time: 07/31/15  3:51 PM  Result Value Ref Range  Status   Specimen Description ABSCESS RIGHT LOWER ABDOMINAL QUADRANT  Final   Special Requests Normal  Final   Gram Stain PENDING  Incomplete   Culture   Final    NO ANAEROBES ISOLATED; CULTURE IN PROGRESS FOR 5 DAYS Performed at [facility]    Report Status PENDING  Incomplete     Studies: No results found.  Scheduled Meds: . piperacillin-tazobactam (ZOSYN)  IV  3.375 g Intravenous 3 times per day  . 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 If 7PM-7AM, please contact night-coverage at [email or web address], password [facility] 08/03/2015, 8:48 AM  LOS: 4 days

## 2015-08-03 NOTE — Progress Notes (Signed)
Patient ID: Erin Santiago, female   DOB: [birth year], 60 y.o.   MRN: [id number]    Referring Physician(s): CCS  Corrie Mckusick  Chief Complaint: Right lower quadrant abdominal abscess  Subjective: Pt feeling better today; still has some epigastric discomfort and occ nausea; sitting up in chair; has passed some flatus, but no BM   Allergies: Review of patient's allergies indicates no known allergies.  Medications:  Current facility-administered medications:  .  acetaminophen (TYLENOL) tablet 650 mg, 650 mg, Oral, Q6H PRN, 650 mg at 08/01/15 2009 **OR** acetaminophen (TYLENOL) suppository 650 mg, 650 mg, Rectal, Q6H PRN, Irine Seal V, MD .  albuterol (PROVENTIL) (2.5 MG/3ML) 0.083% nebulizer solution 2.5 mg, 2.5 mg, Nebulization, Q2H PRN, Eugenie Filler, MD .  bisacodyl (DULCOLAX) suppository 10 mg, 10 mg, Rectal, Daily PRN, Irine Seal V, MD .  hydrocortisone cream 1 %, , Topical, PRN, Irine Seal V, MD .  iohexol (OMNIPAQUE) 300 MG/ML solution 50 mL, 50 mL, Oral, Once PRN, Milton Ferguson, MD, 50 mL at 07/30/15 1220 .  ketorolac (TORADOL) 15 MG/ML injection 15 mg, 15 mg, Intravenous, Q6H PRN, Eugenie Filler, MD, 15 mg at 08/03/15 0755 .  LORazepam (ATIVAN) injection 0.5 mg, 0.5 mg, Intravenous, Q6H PRN, Irine Seal V, MD .  morphine 2 MG/ML injection 1 mg, 1 mg, Intravenous, Q4H PRN, Irine Seal V, MD .  ondansetron [facility]) tablet 4 mg, 4 mg, Oral, Q6H PRN **OR** ondansetron (ZOFRAN) injection 4 mg, 4 mg, Intravenous, Q6H PRN, Eugenie Filler, MD, 4 mg at 08/01/15 2021 .  oxyCODONE-acetaminophen (PERCOCET/ROXICET) 5-325 MG per tablet 1-2 tablet, 1-2 tablet, Oral, Q4H PRN, Irine Seal V, MD .  piperacillin-tazobactam (ZOSYN) IVPB 3.375 g, 3.375 g, Intravenous, 3 times per day, Emina Riebock, NP, Last Rate: 12.5 mL/hr at 08/03/15 0558, 3.375 g at 08/03/15 0558 .  sodium chloride flush (NS) 0.9 % injection 3 mL, 3 mL, Intravenous, Q12H, Eugenie Filler, MD,  3 mL at 08/02/15 1000    Vital Signs: BP 110/45 mmHg  Pulse 85  Temp(Src) 99.6 F (37.6 C) (Oral)  Resp 23  Ht 5' (1.524 m)  Wt 228 lb 2.8 oz (103.5 kg)  BMI 44.56 kg/m2  SpO2 98%  LMP 02/04/2014  Physical Exam RLQ drain intact, dressing dry, site NT,  160cc dark foul smelling(feculent) output recorded, 60cc in bulb now.  Imaging: Ct Abdomen Pelvis W Contrast  07/30/2015  CLINICAL DATA:  Acute onset of abdominal pain today. Diarrhea. History of metastatic endometrial carcinoma. EXAM: CT ABDOMEN AND PELVIS WITH CONTRAST TECHNIQUE: Multidetector CT imaging of the abdomen and pelvis was performed using the standard protocol following bolus administration of intravenous contrast. CONTRAST:  132m OMNIPAQUE IOHEXOL 300 MG/ML  SOLN COMPARISON:  07/21/2015. FINDINGS: Lower chest: Significant bibasilar atelectasis. No pleural effusion. No pericardial effusion. The distal esophagus is grossly normal. 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 Hepatobiliary: No focal hepatic lesions or intrahepatic biliary dilatation. The gallbladder is normal. No common bile duct dilatation. Pancreas: No mass, inflammation or ductal dilatation. Spleen: Normal size.  No focal lesions. Adrenals/Urinary Tract: The adrenal glands and kidneys are unremarkable and stable. Stomach/Bowel: Surgical changes involving the stomach with gastric sleeve procedure. The duodenum is unremarkable. The proximal and mid small bowel is unremarkable. There is moderate inflammation in falling the terminal and distal ileum. On the prior CT scan there was a 5 cm "Mass" near the cecum. This now contains air and contrast suggesting that there has been erosion into the small  bowel or a walled-off small bowel perforation. In retrospect I am not sure that at the pericecal lesion was actually nodal disease and may have been a complex fluid collection at that point that has now fistualized. Also, if this was a solid mass that had necrosis  and eroded into the small bowel I would not expect to see air throughout the entire area. Either way, this is a bowel perforation and abscess and need to be drained. There is also moderate fluid in the small amount of air in the pelvis which may communicate. Vascular/Lymphatic: The nodal disease in the abdomen/pelvis appears relatively stable since 2 weeks ago. The periduodenal lesion on image 30 measures 12 mm. Retroperitoneal lesion on image 45 measures 37.5 mm. The left-sided retroperitoneal nodal lesion on image number 52 measures 21.5 mm. Left pelvic nodal lesion on image number 72 measures 20.5 mm. Other: Status post hysterectomy.  No inguinal adenopathy. Musculoskeletal: No significant bony findings. IMPRESSION: CT findings consistent with a bowel perforation. There is a large (9 x 4.5 cm) complex right lower quadrant and pelvic abscess containing a small amount of contrast suggesting communication with the distal ileum. This could be due to tumor erosion as discussed above. Stable adenopathy in the abdomen/pelvis. These results were called by telephone at the time of interpretation on 07/30/2015 at 3:41 pm to Dr. Arthor Captain , who verbally acknowledged these results. Electronically Signed   By: Rudie Meyer M.D.   On: 07/30/2015 15:41   Dg Abd Acute W/chest  07/30/2015  CLINICAL DATA:  Shortness of breath today, history endometrial cancer, diverticulosis, abdominal pain, cold sensation, blood in diarrhea, urinary incontinence, post chemotherapy and radiation therapy EXAM: DG ABDOMEN ACUTE W/ 1V CHEST COMPARISON:  Chest radiograph 11/10/2014, CT chest abdomen and pelvis 07/21/2015 FINDINGS: RIGHT jugular Port-A-Cath with tip projecting over SVC above cavoatrial junction. Minimal enlargement of cardiac silhouette. Mild tortuosity of thoracic aorta. Mediastinal contours and pulmonary vascularity otherwise normal. Bibasilar atelectasis. Lungs otherwise clear. No pleural effusion or pneumothorax. Retained  contrast within a few small bowel loops in the mid abdomen which appear prominent, raising question of small bowel obstruction. These small bowel loops appear more prominent than were seen on the recent CT. No definite free intraperitoneal air. Prior gastric surgery. Bones demineralized. IMPRESSION: Dilated small bowel loops containing residual contrast in the mid abdomen cold most likely representing a component of small bowel obstruction. Bibasilar atelectasis. Electronically Signed   By: Ulyses Southward M.D.   On: 07/30/2015 13:16   Ct Image Guided Drainage By Percutaneous Catheter  07/31/2015  INDICATION: Right lower quadrant abdominal abscess, bowel perforation EXAM: CT GUIDED DRAINAGE OF RIGHT LOWER QUADRANT ABDOMINAL ABSCESS MEDICATIONS: The patient is currently admitted to the hospital and receiving intravenous antibiotics. The antibiotics were administered within an appropriate time frame prior to the initiation of the procedure. ANESTHESIA/SEDATION: 1.0 mg IV Versed 50 mcg IV Fentanyl Moderate Sedation Time:  24 MINUTES The patient was continuously monitored during the procedure by the interventional radiology nurse under my direct supervision. COMPLICATIONS: None immediate. TECHNIQUE: Informed written consent was obtained from the patient after a thorough discussion of the procedural risks, benefits and alternatives. All questions were addressed. Maximal Sterile Barrier Technique was utilized including caps, mask, sterile gowns, sterile gloves, sterile drape, hand hygiene and skin antiseptic. A timeout was performed prior to the initiation of the procedure. PROCEDURE: The right lower quadrant was prepped with ChloraPrep in a sterile fashion, and a sterile drape was applied covering the operative field.  A sterile gown and sterile gloves were used for the procedure. Local anesthesia was provided with 1% Lidocaine. Previous imaging reviewed. Patient positioned supine. Noncontrast localization CT performed.  The right lower quadrant air-fluid collection adjacent to the terminal ileum was localized. Under sterile conditions and local anesthesia, an 18 gauge 15 cm access needle was advanced from a right lower quadrant lateral approach into the air-fluid collection. Guidewire inserted. position confirmed with serial CT. Tract dilatation performed to insert a 10 [place] drain. Catheter position confirmed with CT. Syringe aspiration yielded bloody exudative malodorous fluid. Sample sent for Gram stain and culture. Catheter secured with a Prolene suture and connected to external suction bulb. No immediate complication. Patient tolerated the procedure well. Sterile dressing applied. FINDINGS: CT imaging confirms needle access of the right lower quadrant abscess for drain insertion IMPRESSION: Successful CT-guided right lower quadrant abscess drain insertion as above. Electronically Signed   By: Jerilynn Mages.  Shick M.D.   On: 07/31/2015 16:16    Labs:  CBC:  Recent Labs  08/01/15 0509 08/01/15 2050 08/02/15 0430 08/03/15 0425  WBC 9.1 10.4 10.0 8.7  HGB 9.7* 9.8* 9.9* 9.9*  HCT 29.9* 29.8* 30.1* 29.8*  PLT 196 225 230 262    COAGS:  Recent Labs  08/26/14 1300 07/30/15 2242 07/31/15 0428  INR 0.95 1.49 1.41  APTT 27  --  32    BMP:  Recent Labs  07/31/15 0428 07/31/15 1630 08/01/15 0509 08/02/15 0430 08/03/15 0425  NA 136  --  138 140 137  K 3.3* 4.1 3.8 3.2* 4.4  CL 106  --  110 104 103  CO2 22  --  18* 26 25  GLUCOSE 96  --  68 89 82  BUN 16  --  _0 CALCIUM 7.6*  --  7.9* 8.1* 7.7*  CREATININE 0.72  --  0.68 0.70 0.60  GFRNONAA >60  --  >60 >60 >60  GFRAA >60  --  >60 >60 >60    LIVER FUNCTION TESTS:  Recent Labs  07/30/15 1251 07/31/15 0428 08/01/15 0509 08/03/15 0425  BILITOT 3.4* 2.4* 2.9* 2.8*  AST 23 16 13* 11*  ALT 13* 11* 9* 8*  ALKPHOS 60 48 50 52  PROT 7.5 5.6* 5.5* 5.4*  ALBUMIN 3.9 2.7* 2.4* 2.3*    Assessment and Plan: Met endom ca; s/p drainage of RLQ  abd abscess 2/24;  IR following  Electronically Signed: Ascencion Dike 08/03/2015, 10:14 AM   I spent a total of 15 minutes at the the patient's bedside AND on the patient's hospital floor or unit, greater than 50% of which was counseling/coordinating care for RLQ abdominal abscess drain

## 2015-08-03 NOTE — Progress Notes (Signed)
Patient ID: Erin Santiago, female   DOB: 02/26/1956, 60 y.o.   MRN: [id number]     [facility]      [address] [place], [place] [address]    Phone: [phone] FAX: [phone]     Subjective: 171m out from drain. Pain is better but still requiring narcotics. Tolerating clears. WBC normal.  Afebrile.    Objective:  Vital signs:  Filed Vitals:   08/03/15 0500 08/03/15 0755 08/03/15 0800 08/03/15 1000  BP:   110/45 98/40  Pulse: 85     Temp:   99.6 F (37.6 C)   TempSrc:   Oral   Resp: '18 17 23 21  '$ Height:      Weight:      SpO2: 97% 98% 98% 97%    Last BM Date: 07/30/15  Intake/Output   Yesterday:  02/26 0701 - 02/27 0700 In: 2886.3 [P.O.:550; I.V.:1681.3; IV Piggyback:650] Out: 460 [Urine:300; Drains:160] This shift:  Total I/O In: 270 [P.O.:100; I.V.:150; Other:20] Out: 479[Urine:400; Drains:40]  Physical Exam: General: Pt awake/alert/oriented x4 in no acute distress Abdomen: Soft. Nondistended. RLQ tenderness. Drain with dark bloody, malodorous output. No evidence of peritonitis. No incarcerated hernias.   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)   Metastatic cancer to spine ([facility]    Results:   Labs: Results for orders placed or performed during the hospital encounter of 07/30/15 (from the past 48 hour(s))  CBC with Differential/Platelet     Status: Abnormal   Collection Time: 08/01/15  8:50 PM  Result Value Ref Range   WBC 10.4 4.0 - 10.5 K/uL   RBC 3.50 (L) 3.87 - 5.11 MIL/uL   Hemoglobin 9.8 (L) 12.0 - 15.0 g/dL   HCT 29.8 (L) 36.0 - 46.0 %   MCV 85.1 78.0 - 100.0 fL   MCH 28.0 26.0 - 34.0 pg   MCHC 32.9 30.0 -  36.0 g/dL   RDW 14.4 11.5 - 15.5 %   Platelets 225 150 - 400 K/uL   Neutrophils Relative % 85 %   Neutro Abs 8.9 (H) 1.7 - 7.7 K/uL   Lymphocytes Relative 4 %   Lymphs Abs 0.4 (L) 0.7 - 4.0 K/uL   Monocytes Relative 7 %   Monocytes Absolute 0.7 0.1 - 1.0 K/uL   Eosinophils Relative 4 %   Eosinophils Absolute 0.4 0.0 - 0.7 K/uL   Basophils Relative 0 %   Basophils Absolute 0.0 0.0 - 0.1 K/uL  CBC     Status: Abnormal   Collection Time: 08/02/15  4:30 AM  Result Value Ref Range   WBC 10.0 4.0 - 10.5 K/uL   RBC 3.53 (L) 3.87 - 5.11 MIL/uL   Hemoglobin 9.9 (L) 12.0 - 15.0 g/dL   HCT 30.1 (L) 36.0 - 46.0 %   MCV 85.3 78.0 - 100.0 fL   MCH 28.0 26.0 - 34.0 pg   MCHC 32.9 30.0 - 36.0 g/dL   RDW 14.4 11.5 - 15.5 %   Platelets 230 150 - 400 K/uL  Basic metabolic panel     Status: Abnormal   Collection Time: 08/02/15  4:30 AM  Result Value Ref Range   Sodium 140  135 - 145 mmol/L   Potassium 3.2 (L) 3.5 - 5.1 mmol/L   Chloride 104 101 - 111 mmol/L   CO2 26 22 - 32 mmol/L   Glucose, Bld 89 65 - 99 mg/dL   BUN 11 6 - 20 mg/dL   Creatinine, Ser 0.70 0.44 - 1.00 mg/dL   Calcium 8.1 (L) 8.9 - 10.3 mg/dL   GFR calc non Af Amer >60 >60 mL/min   GFR calc Af Amer >60 >60 mL/min    Comment: (NOTE) The eGFR has been calculated using the CKD EPI equation. This calculation has not been validated in all clinical situations. eGFR's persistently <60 mL/min signify possible Chronic Kidney Disease.    Anion gap 10 5 - 15  Magnesium     Status: None   Collection Time: 08/02/15  4:30 AM  Result Value Ref Range   Magnesium 1.7 1.7 - 2.4 mg/dL  CBC     Status: Abnormal   Collection Time: 08/03/15  4:25 AM  Result Value Ref Range   WBC 8.7 4.0 - 10.5 K/uL   RBC 3.46 (L) 3.87 - 5.11 MIL/uL   Hemoglobin 9.9 (L) 12.0 - 15.0 g/dL   HCT 29.8 (L) 36.0 - 46.0 %   MCV 86.1 78.0 - 100.0 fL   MCH 28.6 26.0 - 34.0 pg   MCHC 33.2 30.0 - 36.0 g/dL   RDW 14.7 11.5 - 15.5 %   Platelets 262 150 - 400 K/uL   Comprehensive metabolic panel     Status: Abnormal   Collection Time: 08/03/15  4:25 AM  Result Value Ref Range   Sodium 137 135 - 145 mmol/L   Potassium 4.4 3.5 - 5.1 mmol/L    Comment: DELTA CHECK NOTED REPEATED TO VERIFY NO VISIBLE HEMOLYSIS    Chloride 103 101 - 111 mmol/L   CO2 25 22 - 32 mmol/L   Glucose, Bld 82 65 - 99 mg/dL   BUN 8 6 - 20 mg/dL   Creatinine, Ser 0.60 0.44 - 1.00 mg/dL   Calcium 7.7 (L) 8.9 - 10.3 mg/dL   Total Protein 5.4 (L) 6.5 - 8.1 g/dL   Albumin 2.3 (L) 3.5 - 5.0 g/dL   AST 11 (L) 15 - 41 U/L   ALT 8 (L) 14 - 54 U/L   Alkaline Phosphatase 52 38 - 126 U/L   Total Bilirubin 2.8 (H) 0.3 - 1.2 mg/dL   GFR calc non Af Amer >60 >60 mL/min   GFR calc Af Amer >60 >60 mL/min    Comment: (NOTE) The eGFR has been calculated using the CKD EPI equation. This calculation has not been validated in all clinical situations. eGFR's persistently <60 mL/min signify possible Chronic Kidney Disease.    Anion gap 9 5 - 15  Magnesium     Status: None   Collection Time: 08/03/15  4:25 AM  Result Value Ref Range   Magnesium 2.0 1.7 - 2.4 mg/dL    Imaging / Studies: No results found.  Medications / Allergies:  Scheduled Meds: . piperacillin-tazobactam (ZOSYN)  IV  3.375 g Intravenous 3 times per day  . sodium chloride flush  3 mL Intravenous Q12H   Continuous Infusions:  PRN Meds:.acetaminophen **OR** acetaminophen, albuterol, bisacodyl, hydrocortisone cream, iohexol, ketorolac, LORazepam, morphine injection, ondansetron **OR** ondansetron (ZOFRAN) IV, oxyCODONE-acetaminophen  Antibiotics: Anti-infectives    Start     Dose/Rate Route Frequency Ordered Stop   07/30/15 1645  piperacillin-tazobactam (ZOSYN) IVPB 3.375 g     3.375 g 12.5 mL/hr  over 240 Minutes Intravenous 3 times per day 07/30/15 1634     07/30/15 1545  piperacillin-tazobactam (ZOSYN) IVPB 3.375 g     3.375 g 100 mL/hr over 30 Minutes Intravenous  Once 07/30/15 1541 07/30/15 1757          Assessment/Plan 60 year old female with a 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 Afebrile. Hemodynamically stable. focally tender to RLQ.  Tolerating clears.  I think it would be reasonable to advance to fulls, but will discuss with Dr. Johney Maine FEN-clears  ID-zosyn for e coli, pan sensitive  Dispo-stable for floor transfer   Erby Pian, [facility] Pager [phone]([id number])   08/03/2015 11:33 AM

## 2015-08-03 NOTE — Progress Notes (Signed)
Fever to 101.0 orally with nausea.; flushed.She feels it is a reaction to Toradol given at 1400. She requests it be removed from her chart. Given Tylenol and Zofran.

## 2015-08-03 NOTE — Progress Notes (Signed)
OT Cancellation Note  Patient Details Name: Lutie Sawada MRN: [id number] DOB: 1955-06-16   Cancelled Treatment:    Reason Eval/Treat Not Completed: Fatigue/lethargy limiting ability to participate  Will check on pt next day.    Betsy Pries 08/03/2015, 4:25 PM

## 2015-08-03 NOTE — Progress Notes (Signed)
Dr. Lucia Gaskins notified of temp spike and persistent fever this afternoon. Relates new pain in left mid flank area. BP stable.  Cancel transfer and make Stepdown per Dr. Lucia Gaskins.

## 2015-08-04 ENCOUNTER — Other Ambulatory Visit: Payer: Self-pay | Admitting: Oncology

## 2015-08-04 ENCOUNTER — Telehealth: Payer: Self-pay | Admitting: Oncology

## 2015-08-04 ENCOUNTER — Inpatient Hospital Stay (HOSPITAL_COMMUNITY): Payer: Federal, State, Local not specified - PPO

## 2015-08-04 LAB — URINALYSIS, ROUTINE W REFLEX MICROSCOPIC
Bilirubin Urine: NEGATIVE
Glucose, UA: NEGATIVE mg/dL
Hgb urine dipstick: NEGATIVE
Ketones, ur: 15 mg/dL — AB
LEUKOCYTES UA: NEGATIVE
NITRITE: NEGATIVE
PROTEIN: NEGATIVE mg/dL
SPECIFIC GRAVITY, URINE: 1.004 — AB (ref 1.005–1.030)
pH: 5 (ref 5.0–8.0)

## 2015-08-04 LAB — BASIC METABOLIC PANEL
ANION GAP: 9 (ref 5–15)
BUN: 6 mg/dL (ref 6–20)
CHLORIDE: 104 mmol/L (ref 101–111)
CO2: 26 mmol/L (ref 22–32)
Calcium: 8.2 mg/dL — ABNORMAL LOW (ref 8.9–10.3)
Creatinine, Ser: 0.5 mg/dL (ref 0.44–1.00)
Glucose, Bld: 91 mg/dL (ref 65–99)
POTASSIUM: 4.1 mmol/L (ref 3.5–5.1)
SODIUM: 139 mmol/L (ref 135–145)

## 2015-08-04 LAB — CBC WITH DIFFERENTIAL/PLATELET
BASOS ABS: 0 10*3/uL (ref 0.0–0.1)
BASOS PCT: 0 %
EOS ABS: 0.4 10*3/uL (ref 0.0–0.7)
Eosinophils Relative: 4 %
HCT: 30 % — ABNORMAL LOW (ref 36.0–46.0)
HEMOGLOBIN: 10 g/dL — AB (ref 12.0–15.0)
LYMPHS PCT: 7 %
Lymphs Abs: 0.7 10*3/uL (ref 0.7–4.0)
MCH: 28.8 pg (ref 26.0–34.0)
MCHC: 33.3 g/dL (ref 30.0–36.0)
MCV: 86.5 fL (ref 78.0–100.0)
Monocytes Absolute: 1.2 10*3/uL — ABNORMAL HIGH (ref 0.1–1.0)
Monocytes Relative: 12 %
NEUTROS PCT: 77 %
Neutro Abs: 7.3 10*3/uL (ref 1.7–7.7)
Platelets: 301 10*3/uL (ref 150–400)
RBC: 3.47 MIL/uL — AB (ref 3.87–5.11)
RDW: 15 % (ref 11.5–15.5)
WBC: 9.6 10*3/uL (ref 4.0–10.5)

## 2015-08-04 LAB — CULTURE, BLOOD (ROUTINE X 2)
CULTURE: NO GROWTH
Culture: NO GROWTH

## 2015-08-04 MED ORDER — ONDANSETRON HCL 4 MG PO TABS
4.0000 mg | ORAL_TABLET | Freq: Four times a day (QID) | ORAL | Status: DC | PRN
  Administered 2015-08-04 – 2015-08-27 (×9): 4 mg via ORAL
  Filled 2015-08-04 (×8): qty 1

## 2015-08-04 MED ORDER — FUROSEMIDE 10 MG/ML IJ SOLN
40.0000 mg | Freq: Once | INTRAMUSCULAR | Status: AC
  Administered 2015-08-04: 40 mg via INTRAVENOUS
  Filled 2015-08-04: qty 4

## 2015-08-04 MED ORDER — MORPHINE SULFATE 2 MG/ML IJ SOLN: 1.0000 mg | INTRAMUSCULAR | Status: DC | PRN

## 2015-08-04 MED ORDER — NYSTATIN 100000 UNIT/GM EX POWD
Freq: Two times a day (BID) | CUTANEOUS | Status: DC
  Administered 2015-08-04 – 2015-08-18 (×20): via TOPICAL
  Administered 2015-08-19: 1 via TOPICAL
  Administered 2015-08-20 – 2015-08-26 (×7): via TOPICAL
  Filled 2015-08-04 (×2): qty 15

## 2015-08-04 MED ORDER — SODIUM CHLORIDE 0.9 % IV SOLN
100.0000 mg | INTRAVENOUS | Status: AC
  Administered 2015-08-05 – 2015-08-07 (×3): 100 mg via INTRAVENOUS
  Filled 2015-08-04 (×4): qty 100

## 2015-08-04 MED ORDER — SODIUM CHLORIDE 0.9 % IV SOLN
8.0000 mg | Freq: Four times a day (QID) | INTRAVENOUS | Status: DC | PRN
  Administered 2015-08-04: 8 mg via INTRAVENOUS
  Filled 2015-08-04 (×4): qty 4

## 2015-08-04 MED ORDER — IOHEXOL 300 MG/ML  SOLN
25.0000 mL | INTRAMUSCULAR | Status: AC
  Administered 2015-08-04 (×2): 25 mL via ORAL

## 2015-08-04 MED ORDER — SODIUM CHLORIDE 0.9 % IV SOLN
200.0000 mg | Freq: Once | INTRAVENOUS | Status: AC
  Administered 2015-08-04: 200 mg via INTRAVENOUS
  Filled 2015-08-04: qty 200

## 2015-08-04 MED ORDER — MORPHINE SULFATE (PF) 2 MG/ML IV SOLN
1.0000 mg | INTRAVENOUS | Status: DC | PRN
  Administered 2015-08-04: 1 mg via INTRAVENOUS
  Filled 2015-08-04: qty 1

## 2015-08-04 MED ORDER — IOHEXOL 300 MG/ML  SOLN
100.0000 mL | Freq: Once | INTRAMUSCULAR | Status: AC | PRN
  Administered 2015-08-04: 100 mL via INTRAVENOUS

## 2015-08-04 NOTE — Progress Notes (Signed)
[facility] Progress Note  Patient Care Team: Merrilee Seashore, MD as PCP - General (Internal Medicine) Vivien Rossetti, MD as Consulting Physician (Oncology) Gordy Levan, MD as Consulting Physician (Oncology) Arzell Mcgeehan Salon, MD as Consulting Physician (Gynecology)     Subjective: Pt feels awful.  She c/o generalized abdominal pain and burning over her bladder.  Vomited yesterday after D1 diet.  No longer has appetite.  Not nauseated this am, but doesn't want to eat.  Ambulating well in hall and up to chair.    Objective: Vital signs in last 24 hours: Temp:  [98 F (36.7 C)-101.5 F (38.6 C)] 98.8 F (37.1 C) (02/28 0509) Pulse Rate:  [95] 95 (02/27 1859) Resp:  [18-27] 24 (02/28 0547) BP: (98-166)/(40-83) 153/77 mmHg (02/28 0400) SpO2:  [91 %-100 %] 93 % (02/28 0547) Weight:  [105.2 kg (231 lb 14.8 oz)] 105.2 kg (231 lb 14.8 oz) (02/28 0304) Last BM Date: 07/30/15  Intake/Output from previous day: 02/27 0701 - 02/28 0700 In: 1220 [P.O.:700; I.V.:150; IV Piggyback:150] Out: [id number] [Urine:2050; Emesis/NG output:150; Drains:225] Intake/Output this shift:    PE: Gen:  Alert, NAD, pleasant Card:  RRR, no M/G/R heard Pulm:  CTA, no W/R/R Abd: Soft, distended, tender over drain site and suprapubic region, +BS, no HSM,  drain with feculent foul smelling drainage Ext:  Swollen arms legs   Lab Results:   Recent Labs  08/03/15 0425 08/04/15 0508  WBC 8.7 9.6  HGB 9.9* 10.0*  HCT 29.8* 30.0*  PLT 262 301   BMET  Recent Labs  08/03/15 0425 08/04/15 0508  NA 137 139  K 4.4 4.1  CL 103 104  CO2 25 26  GLUCOSE 82 91  BUN 8 6  CREATININE 0.60 0.50  CALCIUM 7.7* 8.2*   PT/INR No results for input(s): LABPROT, INR in the last 72 hours. CMP     Component Value Date/Time   NA 139 08/04/2015 0508   NA 140 07/17/2015 1058   K 4.1 08/04/2015 0508   K 4.1 07/17/2015 1058   CL 104 08/04/2015 0508   CO2 26 08/04/2015 0508   CO2 23 07/17/2015  1058   GLUCOSE 91 08/04/2015 0508   GLUCOSE 93 07/17/2015 1058   BUN 6 08/04/2015 0508   BUN 18.4 07/17/2015 1058   CREATININE 0.50 08/04/2015 0508   CREATININE 0.8 07/17/2015 1058   CALCIUM 8.2* 08/04/2015 0508   CALCIUM 9.2 07/17/2015 1058   PROT 5.4* 08/03/2015 0425   PROT 7.1 07/17/2015 1058   ALBUMIN 2.3* 08/03/2015 0425   ALBUMIN 3.5 07/17/2015 1058   AST 11* 08/03/2015 0425   AST 18 07/17/2015 1058   ALT 8* 08/03/2015 0425   ALT 12 07/17/2015 1058   ALKPHOS 52 08/03/2015 0425   ALKPHOS 87 07/17/2015 1058   BILITOT 2.8* 08/03/2015 0425   BILITOT 0.85 07/17/2015 1058   GFRNONAA >60 08/04/2015 0508   GFRAA >60 08/04/2015 0508   Lipase     Component Value Date/Time   LIPASE 18 07/30/2015 1251       Studies/Results: No results found.  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 Tumor invasion into bowel? Complex 9x4cm fluid collection -Leukocytosis has resolved, but spiked a fever to 101.5*F yesterday 1800. Vomited yesterday.  Quite tender in suprapubic region and over drain site -Agree with rechecking CT scan (5days from last CT) given fever and worsening pain -Labs in AM -NPO in case she would need another drain or OR -Ambulate and IS -SCD's and lovenox or heparin are okay from our perspective if no indications for surgery  or perc drain today  FEN-back off to NPO in case of further drains being needed ID-Zosyn Day #6 for e. coli, pan sensitive, ? cipro at discharge Dispo-stable for floor transfer.  Consider input with [facility] Vivien Rossetti, MD as far as further Tx.    LOS: 5 days    Nat Christen 08/04/2015, 8:26 AM Pager: [phone]  (7am - 4:30pm M-F; 7am - 11:30am Sa/Su)

## 2015-08-04 NOTE — Progress Notes (Signed)
MEDICAL ONCOLOGY August 04, 2015, 9:38 AM  Hospital day 6 Antibiotics: zosyn Chemotherapy: none since May 2016. Planned adriamycin / olaratumab held  Outpatient Physicians:Emma Rossi/ Andrew Au, , [name], [name] [facility]),Ajith Ashby Dawes (PCP [facility]), [name]; [name], [name]    EMR reviewed. Discussed medications with RN at bedside. Spoke with Dr Grandville Silos on unit.  Subjective: Has felt much worse since last pm "worse than I ever felt with the cancer".  Drowsy with IV morphine but pain in abdomen not resolved with 1 mg dose last 0500. Pain is diffuse in abdomen and now particularly left lateral upper abdomen, which was "hot and red" last pm;  not as tender in mid abdomen as on admission. Increased drainage which appears fecal from RLQ drain. Passed small flatus this AM, no BM. Nausea intermittently including now, vomited x 1 last pm. Febrile now, no shaking chills. Hands puffy. Reports bladder pressure and dysuria. Has been getting up to [facility]. Rash thigh and lower abdomen possibly from toradol, resolved. Coughs with tries to breathe deeply. No chest pain. Mouth not sore. Patient tells me that her sister in [place], who is RN, has been in touch.     ONCOLOGIC HISTORY Patient had been menopausal since age 5 until she had what seemed to her to be a menstrual period in Sept 2015, with large blood clot at completion of bleeding stopped then. She continued with lesser bleeding over next several months until she was seen in 06-2014 by [name]. Hemoglobin was 12.7 on 07-04-14. CT CAP 06-25-14 had uterus 16.4 x 13.5 x 14.4 cm with marked expansion of endometrial canal by heterogeneous mass, bilateral pelvic sidewall adenopathy, iliac adenopathy with no definite retroperitoneal or mesenteric adenopathy, no ascites, no liver mets. Attempted endometrial sampling 06-26-14 and 07-03-14 was nondiagnostic; around that time the  patient was passing clots and using one large maxipad hourly. She was seen by [name] 07-05-14, with uterine fundus palpable above umbilicus. She had surgery by [name] at [facility] on 07-14-14, which was exploratory laparotomy with TAH BSO, bilateral total pelvic lymphadenectomy and sampling of aortic and renal nodes. 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 Case was presented at [facility] multidisciplinary conference 07-23-14, with recommendation for PET CT to evaluate inguinal and portahepatis nodes, and to consider adjuvant gemzar taxotere and possibly follow with 4 cycles of adriamycin, then to consider whole pelvic RT. She saw [name] for post op follow up on 07-28-14, with recommendation for gemzar taxotere and adriamycin, whole pelvic RT after chemo and consideration of Megace maintenance after chemo (possibly prior to negative ER PR information). PET 08-01-14 had some uptake in aortocaval and left paraaortic regions. She had day 1 cycle 1 gemzar taxotere on 08-28-14, day 8 cycle 1 on 09-04-14 and neulasta on 09-06-14. Admitted with neutropenic fever on day 14 cycle 1, with oral mucositis and some diarrhea. Counts maintained cycle 2 using OnPro neulasta day 9. She had progressive LE swelling after cycle 3, with venous dopplers negative 10-23-14, then LE swelling and SOB so marked after day 1 cycle 4 10-30-14 that chemo was held. Restaging CT AP + CXR 11-10-14 had stable tiny left pulmonary nodule/ no pleural effusion and normal heart size; CT had necrotic aortocaval adenopathy, iliac adenopathy L>R and 7x10 cm fluid collection in pelvis. She received IMRT 45 gray in 25  fractions to pelvis from 6-27 thru 01-05-15, with resolution of LE swelling by completion of course. CT CAP 02-03-15 showed improvement in retroperitoneal and pelvic involvement. She went onto observation after RT, plan to  wait until clear progressive disease before resuming treatment, possibly adriamycin vs on study. CT CAP 05-07-15 showed new lytic lesion at T4, stable 2-3 mm pulmonary nodules, increased left pelvic and retroperitoneal nodes and RLQ peritoneal nodule. Radiation therapy summary as follows: Indication for treatment: A new bone lesion in the right side of the T4 vertebral body, with soft tissue extension slightly impinging upon the central spinal canal, some pain from this lesion Radiation treatment dates: 05/18/2015 through 06/05/2015 Site/dose: T4 thoracic spine area, 35 gray in 14 fractions  Objective: Vital signs in last 24 hours: Blood pressure 153/77, pulse 95, temperature 98.5 F (36.9 C), temperature source Oral, resp. rate 24, height 5' (1.524 m), weight 231 lb 14.8 oz (105.2 kg), last menstrual period 02/04/2014, SpO2 93 %.  Awake, alert, oriented and appropriate,  looks much more uncomfortable now. Strong fecal odor, drain full of gray liquid. Face flushed. Oral mucosa without lesions.  Respirations not labored RA. PAC site ok, IVF at [facility]. Heart RRR, no gallop. Lungs clear anteriorly tho respirations not deep. Abdomen soft, no BS heard. LUQ without rash, erythema, not hot to touch. LE no edema, cords, tenderness. No foley. Moves all extremities. Hands puffy L>R with automatic BP cuff LUE. Speech fluent, no focal neuro deficits. No candida rash now in inguinal areas.   Intake/Output from previous day: 02/27 0701 - 02/28 0700 In: 1220 [P.O.:700; I.V.:150; IV Piggyback:150] Out: 2425 [Urine:2050; Emesis/NG output:150; Drains:225] Intake/Output this shift:      Lab Results:  Recent Labs  08/03/15 0425 08/04/15 0508  WBC 8.7 9.6  HGB 9.9* 10.0*  HCT 29.8* 30.0*  PLT 262 301   BMET  Recent Labs  08/03/15 0425 08/04/15 0508  NA 137 139  K 4.4 4.1  CL 103 104  CO2 25 26  GLUCOSE 82 91  BUN 8 6  CREATININE 0.60 0.50  CALCIUM 7.7* 8.2*   Urine culture  pending   Studies/Results: No results found.   Assessment/Plan: 1. High grade endometrial stromal sarcoma: bowel perforation/ possible tumor invasion into bowel. Initially improved with drain by IR but now appears to be worsening with temp 101, increased drainage with fecal odor, appears more ill. CT has been ordered by Dr Grandville Silos, general surgery following. I have increased IV morphine slightly and increased prn zofran.  History is of initial surgery [facility] 07-14-14, progression by start of cycle 4 adjuvant gemzar taxotere in 10-2014. Radiation to symptomatic aortocaval and bilateral iliac adenopathy. Further progression recently including T4, post radiation there. Not eligible for any of the arms of MATCH trial. Plan had been to start adriamycin + olaratumab at [facility] last week, held now. Long term prognosis from the metastatic endometrial sarcoma is not good, however disease has not been rapidly progressive and patient has been very functional.  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 Hx nonmelanoma skin cancer.    Please page if I can help between rounds [place]

## 2015-08-04 NOTE — Progress Notes (Signed)
[facility] PROGRESS NOTE  Erin Santiago [id number] DOB: 30-Aug-1955 DOA: 07/30/2015 PCP: Erin Seashore, MD  Brief interval history  Erin Santiago is a 60 y.o. female  With history of endometrial stromal sarcoma with bilateral pelvic node involvement, diverticulosis, gastric sleeve at [facility], hysterectomy and nephrectomy at [facility] in 2016. Patient is status post chemotherapy and radiation to the pelvis and back and was due to start chemotherapy on the day of admission due to progression of disease involving the left abdominal pain. Patient had presented to the ED with complaints of 1-2 day history of diffuse abdominal pain. Patient stated that she woke up around 2 AM one day prior to admission with significant diffuse abdominal pain and when she stood up to go through her bathroom she lost control of her bladder due to probable incontinence. Patient stated that the whole day prior to admission was lethargic and very sleepy and slept all day. Patient does endorse subjective fevers, chills, nausea, emesis, generalized weakness, diarrhea. Patient also endorses some hematochezia. Patient denies chest pain, no constipation, no dysuria, no hematemesis, no melena. Patient does endorse an episode of hematemesis. Patient denies any cough. Patient was seen in the emergency room comprehensive metabolic profile done at a sodium of 133 bicarbonate of 19 BUN of 29 creatinine of 1.06 glucose of 125 AST of 13 ALT of 23 protein of 7.5 bilirubin of 3.4 otherwise is within normal limits. Initial lactic acid level is elevated at 2.67 and repeat lactic acid level was down to 0.93. CBC had a white count of 11.1 with no other abnormalities. Urinalysis pending. Acute abdominal series showed dilated small bowel loops containing residual contrast in the midabdomen most likely represent a component of small bowel obstruction. Bibasilar atelectasis. CT abdomen and pelvis did show bowel perforation.  Large 9 x 4.5 cm complex right lower quadrant and pelvic abscess containing a small amount of contrast suggesting communication with the distal ileum. This could be due to tumor erosion. Stable adenopathy in the abdomen and pelvis. Patient was seen in consultation by general surgery who recommended initially nonoperative percutaneous drainage and admission per hospitalist.  Patient was admitted to the stepdown unit general surgery was consulted patient was placed on bowel rest and monitored. Interventional radiology was also consulted for drain placement. Patient had a percutaneous drain placed on 07/31/2015 and was placed empirically on IV Zosyn from admission. Wound/abscess cultures grew out Escherichia coli. Patient was monitored. Patient's pain was controlled on pain medication. Oncology also assess the patient. Patient was started on clear liquids and as diet was advanced patient was noted to have abdominal pain with a fever and emesis. Patient's diet has subsequently been changed back to clear liquids. Repeat CT abdomen and pelvis pending for 08/04/2015. Will keep an stepdown unit. If patient deteriorates will likely need emergency exploratory laparotomy.      Assessment/Plan: #1 bowel perforation, complex intra-abdominal fluid collection 9 x 4.5 cm right lower quadrant and pelvic Questionable etiology. Patient with fever overnight with emesis when she tried oral intake of food. Patient states no significant improvement with abdominal pain since yesterday 08/03/2015. Possible invasion of cancer/ metastatic endometrial cancer into bowel. Per CT abdomen and pelvis looks like perforation may have been walled off and contained. Blood cultures pending. Patient currently afebrile. Leukocytosis trending down. Continue empiric IV Zosyn. Patient has been seen in consultation by general surgery will recommending nonoperative percutaneous drainage placement. S/p RLQ percutaneous drain 07/31/2015 per IR.   Wound/abscess cultures growing Escherichia coli. Will  change patient's diet back to clear liquids as she had some emesis with a fever last night. Will repeat CT scan of abdomen and pelvis. General surgery and interventional radiology following.   #2 leukocytosis Likely secondary to problem #1. Blood cultures pending. Urinalysis nitrite negative leukocytes negative. WBC trending down. Abscess cultures growing Escherichia coli which is pansensitive. Continue empiric IV Zosyn.  #3 history of iron deficiency anemia Hemoglobin dropped to 10.0 from 13.8 on admission, however has remained stable. Likely dilutional component as patient on IV fluids.  Hemoglobin currently at 10. NSL IVF.  Follow.  #4 dehydration/metabolic acidosis  NSL IVF. Metabolic acidosis resolved. Bicarbonate drip has been discontinued. Patient with some bibasilar crackles and a such will give Lasix 40 mg IV 1.  #5 hypokalemia Repleted.Keep magnesium > 2.  #6 lactic acidosis Likely secondary to problem #1 and dehydration. Lactic acid levels trending down. Patient has been pancultured. Abscess growing [place] which is pan sensitive. Continue empiric IV Zosyn.  #7 history of endometrial stromal sarcoma with bilateral pelvic node involvement/metastatic cancer Patient is status post hysterectomy and nephrectomy at [facility] 2016. Patient status post chemotherapy and radiation to the pelvis. Patient was to start chemotherapy on the day of admission due to progression of disease involving the left, iliac adenopathy, 2 masses in the right lower quadrant seen on CT scan on 07/21/2015.  Oncology following.  #8 fever Patient noted to have a fever overnight. Patient with complaints of no improvement with abdominal pain. Patient complaining of burning her bladder. All repeat CT abdomen and pelvis. Check a UA with cultures and sensitivities. Continue empiric IV Zosyn. Follow.  #9 prophylaxis SCDs for DVT prophylaxis.   Code Status:  Full Family Communication: Updated patient. No family at bedside. Disposition Plan: Remain in the step down unit.   Consultants:  Gen. surgery: Dr. Excell Seltzer 07/30/2015   oncology: Dr. Marko Plume 07/31/2015  Interventional radiology  Procedures:  CT abdomen and pelvis 07/30/2015  Acute abdominal series 07/30/2015  Right lower quadrant abdominal abscess drain with CT 07/31/2015 for Dr. Annamaria Boots  Antibiotics:  IV Zosyn 07/30/2015  HPI/Subjective: Patient states abdominal pain with no significant improvement since yesterday. Patient states her bladder is burning. Patient complaining of left flank pain. Patient noted to have a fever last night with nausea and emesis after oral intake of food. Patient states she just does not feel well.  Objective: Filed Vitals:   08/04/15 0509 08/04/15 0547  BP:    Pulse:    Temp: 98.8 F (37.1 C)   Resp:  24    Intake/Output Summary (Last 24 hours) at 08/04/15 0854 Last data filed at 08/04/15 0600  Gross per 24 hour  Intake    905 ml  Output   2425 ml  Net  -1520 ml   Filed Weights   08/01/15 0500 08/02/15 0400 08/04/15 0304  Weight: 103.1 kg (227 lb 4.7 oz) 103.5 kg (228 lb 2.8 oz) 105.2 kg (231 lb 14.8 oz)    Exam:   General:  NAD  Cardiovascular: RRR  Respiratory: Bibasilar crackles  Abdomen: Soft, less diffuse tenderness to palpation right side greater than left, + bowel sounds, mild distention. JP drain with serosanginous fluid.  Musculoskeletal: No clubbing cyanosis or edema.   Data Reviewed: Basic Metabolic Panel:  Recent Labs Lab 07/30/15 1817 07/31/15 0428 07/31/15 1630 08/01/15 0509 08/02/15 0430 08/03/15 0425 08/04/15 0508  NA  --  136  --  138 140 137 139  K  --  3.3* 4.1  3.8 3.2* 4.4 4.1  CL  --  106  --  110 104 103 104  CO2  --  22  --  18* 26 25 26   GLUCOSE  --  96  --  68 89 82 91  BUN  --  16  --  13 11 8 6   CREATININE  --  0.72  --  0.68 0.70 0.60 0.50  CALCIUM  --  7.6*  --  7.9* 8.1* 7.7*  8.2*  MG 1.7 2.5*  --   --  1.7 2.0  --    Liver Function Tests:  Recent Labs Lab 07/30/15 1251 07/31/15 0428 08/01/15 0509 08/03/15 0425  AST 23 16 13* 11*  ALT 13* 11* 9* 8*  ALKPHOS 60 48 50 52  BILITOT 3.4* 2.4* 2.9* 2.8*  PROT 7.5 5.6* 5.5* 5.4*  ALBUMIN 3.9 2.7* 2.4* 2.3*    Recent Labs Lab 07/30/15 1251  LIPASE 18   No results for input(s): AMMONIA in the last 168 hours. CBC:  Recent Labs Lab 08/01/15 0509 08/01/15 2050 08/02/15 0430 08/03/15 0425 08/04/15 0508  WBC 9.1 10.4 10.0 8.7 9.6  NEUTROABS 8.0* 8.9*  --   --  7.3  HGB 9.7* 9.8* 9.9* 9.9* 10.0*  HCT 29.9* 29.8* 30.1* 29.8* 30.0*  MCV 86.4 85.1 85.3 86.1 86.5  PLT 196 225 230 262 301   Cardiac Enzymes: No results for input(s): CKTOTAL, CKMB, CKMBINDEX, TROPONINI in the last 168 hours. BNP (last 3 results) No results for input(s): BNP in the last 8760 hours.  ProBNP (last 3 results) No results for input(s): PROBNP in the last 8760 hours.  CBG: No results for input(s): GLUCAP in the last 168 hours.  Recent Results (from the past 240 hour(s))  Blood culture (routine x 2)     Status: None (Preliminary result)   Collection Time: 07/30/15  6:31 PM  Result Value Ref Range Status   Specimen Description BLOOD RIGHT PORT  Final   Special Requests BOTTLES DRAWN AEROBIC AND ANAEROBIC 5CC EACH  Final   Culture   Final    NO GROWTH 4 DAYS Performed at [facility]    Report Status PENDING  Incomplete  Blood culture (routine x 2)     Status: None (Preliminary result)   Collection Time: 07/30/15  6:33 PM  Result Value Ref Range Status   Specimen Description BLOOD LEFT WRIST  Final   Special Requests BOTTLES DRAWN AEROBIC AND ANAEROBIC 5CC EACH  Final   Culture   Final    NO GROWTH 4 DAYS Performed at [facility]    Report Status PENDING  Incomplete  Culture, Urine     Status: None   Collection Time: 07/30/15  7:36 PM  Result Value Ref Range Status   Specimen Description URINE,  RANDOM  Final   Special Requests NONE  Final   Culture   Final    3,000 COLONIES/mL INSIGNIFICANT GROWTH Performed at [facility]    Report Status 07/31/2015 FINAL  Final  MRSA PCR Screening     Status: None   Collection Time: 07/30/15  8:11 PM  Result Value Ref Range Status   MRSA by PCR NEGATIVE NEGATIVE Final    Comment:        The GeneXpert MRSA Assay (FDA approved for NASAL specimens only), is one component of a comprehensive MRSA colonization surveillance program. It is not intended to diagnose MRSA infection nor to guide or monitor treatment for MRSA infections.  Culture, routine-abscess     Status: None   Collection Time: 07/31/15  3:51 PM  Result Value Ref Range Status   Specimen Description ABSCESS RIGHT LOWER ABDOMINAL QUADRANT  Final   Special Requests Normal  Final   Gram Stain   Final    FEW WBC PRESENT,BOTH PMN AND MONONUCLEAR NO SQUAMOUS EPITHELIAL CELLS SEEN FEW GRAM NEGATIVE RODS Performed at [facility]    Culture   Final    MODERATE ESCHERICHIA COLI Performed at [facility]    Report Status 08/03/2015 FINAL  Final   Organism ID, Bacteria ESCHERICHIA COLI  Final      Susceptibility   Escherichia coli - MIC*    AMPICILLIN <=2 SENSITIVE Sensitive     AMPICILLIN/SULBACTAM <=2 SENSITIVE Sensitive     CEFEPIME <=1 SENSITIVE Sensitive     CEFTAZIDIME <=1 SENSITIVE Sensitive     CEFTRIAXONE <=1 SENSITIVE Sensitive     CIPROFLOXACIN <=0.25 SENSITIVE Sensitive     GENTAMICIN <=1 SENSITIVE Sensitive     IMIPENEM <=0.25 SENSITIVE Sensitive     PIP/TAZO <=4 SENSITIVE Sensitive     TOBRAMYCIN <=1 SENSITIVE Sensitive     TRIMETH/SULFA Value in next row Sensitive      <=20 SENSITIVE(NOTE)    * MODERATE ESCHERICHIA COLI  Anaerobic culture     Status: None (Preliminary result)   Collection Time: 07/31/15  3:51 PM  Result Value Ref Range Status   Specimen Description ABSCESS RIGHT LOWER ABDOMINAL QUADRANT  Final   Special Requests  Normal  Final   Gram Stain PENDING  Incomplete   Culture   Final    NO ANAEROBES ISOLATED; CULTURE IN PROGRESS FOR 5 DAYS Performed at [facility]    Report Status PENDING  Incomplete     Studies: No results found.  Scheduled Meds: . antiseptic oral rinse  7 mL Mouth Rinse BID  . furosemide  40 mg Intravenous Once  . piperacillin-tazobactam (ZOSYN)  IV  3.375 g Intravenous 3 times per day  . 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 If 7PM-7AM, please contact night-coverage at [email or web address], password [facility] 08/04/2015, 8:54 AM  LOS: 5 days

## 2015-08-04 NOTE — Progress Notes (Signed)
Patient ID: Erin Santiago, female   DOB: 1955/10/27, 60 y.o.   MRN: [id number]    Referring Physician(s): CCS  Supervising Physician: Sandi Mariscal  Chief Complaint: Intra-abdominal fluid collection  Subjective: Patient feels nauseated this am.  Otherwise no abdominal pain  Allergies: Review of patient's allergies indicates no known allergies.  Medications: Prior to Admission medications   Medication Sig Start Date End Date Taking? Authorizing Provider  calcium carbonate (TUMS - DOSED IN MG ELEMENTAL CALCIUM) 500 MG chewable tablet Chew 2 tablets by mouth daily.    Yes Historical Provider, MD  dexamethasone (DECADRON) 4 MG tablet Take 2 tablets by mouth once a day on the day after chemotherapy and then take 2 tablets two times a day for 2 days. Take with food. 07/27/15  Yes Lennis Marion Downer, MD  docusate sodium (COLACE) 100 MG capsule Take 100 mg by mouth 2 (two) times daily as needed for mild constipation. Reported on 05/28/2015 07/17/14  Yes Historical Provider, MD  hydrocortisone 2.5 % cream Apply 1 application topically as needed (itch/psoriasis.). Reported on 07/17/2015 05/19/14  Yes Historical Provider, MD  lidocaine-prilocaine (EMLA) cream Apply to Porta-cath 1-2 hrs prior to access. Cover with [employer]. 08/18/14  Yes Lennis Marion Downer, MD  LORazepam (ATIVAN) 1 MG tablet Place 1/2 to 1 tablet under the tongue or swallow every 6 hours as needed for nausea 07/22/15  Yes Lennis P Livesay, MD  meloxicam (MOBIC) 15 MG tablet Take 15 mg by mouth daily.  06/09/14  Yes Historical Provider, MD  ondansetron (ZOFRAN) 8 MG tablet Take 1 tablet (8 mg total) by mouth every 8 (eight) hours as needed for nausea or vomiting. 07/22/15  Yes Lennis Marion Downer, MD  prochlorperazine (COMPAZINE) 10 MG tablet Take 1 tablet (10 mg total) by mouth every 6 (six) hours as needed (Nausea or vomiting). 07/27/15  Yes Lennis Marion Downer, MD  Psyllium 28.3 % POWD Take 1 packet by mouth daily as needed (for fiber). Reported on  05/26/2015   Yes Historical Provider, MD  Resveratrol 250 MG CAPS Take 250 mg by mouth 2 (two) times daily. Reported on 05/26/2015   Yes Historical Provider, MD  ferrous fumarate (HEMOCYTE) 325 (106 FE) MG TABS tablet Take 1 tab daily on an empty stomach with OJ or Vitamin C 500 mg Patient not taking: Reported on 07/09/2015 09/15/14   Lennis Marion Downer, MD  sucralfate (CARAFATE) 1 GM/10ML suspension Take 10 mLs (1 g total) by mouth 4 (four) times daily -  with meals and at bedtime. Patient not taking: Reported on 07/30/2015 05/20/15   [name], MD    Vital Signs: BP 153/77 mmHg  Pulse 95  Temp(Src) 98.8 F (37.1 C) (Oral)  Resp 24  Ht 5' (1.524 m)  Wt 231 lb 14.8 oz (105.2 kg)  BMI 45.29 kg/m2  SpO2 93%  LMP 02/04/2014  Physical Exam: Abd: soft, NT, drain site is c/d/i.  Drain with copious feculent and old blood appearing output.  Imaging: Ct Image Guided Drainage By Percutaneous Catheter  07/31/2015  INDICATION: Right lower quadrant abdominal abscess, bowel perforation EXAM: CT GUIDED DRAINAGE OF RIGHT LOWER QUADRANT ABDOMINAL ABSCESS MEDICATIONS: The patient is currently admitted to the hospital and receiving intravenous antibiotics. The antibiotics were administered within an appropriate time frame prior to the initiation of the procedure. ANESTHESIA/SEDATION: 1.0 mg IV Versed 50 mcg IV Fentanyl Moderate Sedation Time:  24 MINUTES The patient was continuously monitored during the procedure by the interventional radiology nurse under my direct  supervision. COMPLICATIONS: None immediate. TECHNIQUE: Informed written consent was obtained from the patient after a thorough discussion of the procedural risks, benefits and alternatives. All questions were addressed. Maximal Sterile Barrier Technique was utilized including caps, mask, sterile gowns, sterile gloves, sterile drape, hand hygiene and skin antiseptic. A timeout was performed prior to the initiation of the procedure. PROCEDURE: The right  lower quadrant was prepped with ChloraPrep in a sterile fashion, and a sterile drape was applied covering the operative field. A sterile gown and sterile gloves were used for the procedure. Local anesthesia was provided with 1% Lidocaine. Previous imaging reviewed. Patient positioned supine. Noncontrast localization CT performed. The right lower quadrant air-fluid collection adjacent to the terminal ileum was localized. Under sterile conditions and local anesthesia, an 18 gauge 15 cm access needle was advanced from a right lower quadrant lateral approach into the air-fluid collection. Guidewire inserted. position confirmed with serial CT. Tract dilatation performed to insert a 10 [place] drain. Catheter position confirmed with CT. Syringe aspiration yielded bloody exudative malodorous fluid. Sample sent for Gram stain and culture. Catheter secured with a Prolene suture and connected to external suction bulb. No immediate complication. Patient tolerated the procedure well. Sterile dressing applied. FINDINGS: CT imaging confirms needle access of the right lower quadrant abscess for drain insertion IMPRESSION: Successful CT-guided right lower quadrant abscess drain insertion as above. Electronically Signed   By: Jerilynn Mages.  Shick M.D.   On: 07/31/2015 16:16    Labs:  CBC:  Recent Labs  08/01/15 2050 08/02/15 0430 08/03/15 0425 08/04/15 0508  WBC 10.4 10.0 8.7 9.6  HGB 9.8* 9.9* 9.9* 10.0*  HCT 29.8* 30.1* 29.8* 30.0*  PLT 225 230 262 301    COAGS:  Recent Labs  08/26/14 1300 07/30/15 2242 07/31/15 0428  INR 0.95 1.49 1.41  APTT 27  --  32    BMP:  Recent Labs  08/01/15 0509 08/02/15 0430 08/03/15 0425 08/04/15 0508  NA 138 140 137 139  K 3.8 3.2* 4.4 4.1  CL 110 104 103 104  CO2 18* '26 25 26  '$ GLUCOSE 68 89 82 91  BUN '13 11 8 6  '$ CALCIUM 7.9* 8.1* 7.7* 8.2*  CREATININE 0.68 0.70 0.60 0.50  GFRNONAA >60 >60 >60 >60  GFRAA >60 >60 >60 >60    LIVER FUNCTION TESTS:  Recent Labs   07/30/15 1251 07/31/15 0428 08/01/15 0509 08/03/15 0425  BILITOT 3.4* 2.4* 2.9* 2.8*  AST 23 16 13* 11*  ALT 13* 11* 9* 8*  ALKPHOS 60 48 50 52  PROT 7.5 5.6* 5.5* 5.4*  ALBUMIN 3.9 2.7* 2.4* 2.3*    Assessment and Plan: 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  About 225cc/24 hrs. -will follow and follow up on CT scan later today.  Electronically Signed: Henreitta Cea 08/04/2015, 9:05 AM   I spent a total of 15 Minutes at the the patient's bedside AND on the patient's hospital floor or unit, greater than 50% of which was counseling/coordinating care for intra-abdominal abscess, s/p perc drain

## 2015-08-04 NOTE — Telephone Encounter (Signed)
per pof to CX all pt appts pt acutely ill in hospital

## 2015-08-05 ENCOUNTER — Inpatient Hospital Stay (HOSPITAL_COMMUNITY): Payer: Federal, State, Local not specified - PPO

## 2015-08-05 ENCOUNTER — Encounter (HOSPITAL_COMMUNITY): Payer: Self-pay | Admitting: Radiology

## 2015-08-05 LAB — COMPREHENSIVE METABOLIC PANEL
ALBUMIN: 2.5 g/dL — AB (ref 3.5–5.0)
ALK PHOS: 48 U/L (ref 38–126)
ALT: 10 U/L — ABNORMAL LOW (ref 14–54)
ANION GAP: 15 (ref 5–15)
AST: 11 U/L — ABNORMAL LOW (ref 15–41)
BUN: 6 mg/dL (ref 6–20)
CALCIUM: 8.1 mg/dL — AB (ref 8.9–10.3)
CO2: 27 mmol/L (ref 22–32)
Chloride: 93 mmol/L — ABNORMAL LOW (ref 101–111)
Creatinine, Ser: 0.8 mg/dL (ref 0.44–1.00)
GFR calc Af Amer: >60 mL/min (ref >60)
GFR calc non Af Amer: >60 mL/min (ref >60)
GLUCOSE: 77 mg/dL (ref 65–99)
Potassium: 3 mmol/L — ABNORMAL LOW (ref 3.5–5.1)
SODIUM: 135 mmol/L (ref 135–145)
Total Bilirubin: 2.6 mg/dL — ABNORMAL HIGH (ref 0.3–1.2)
Total Protein: 6.2 g/dL — ABNORMAL LOW (ref 6.5–8.1)

## 2015-08-05 LAB — CBC WITH DIFFERENTIAL/PLATELET
BASOS ABS: 0 10*3/uL (ref 0.0–0.1)
Basophils Relative: 0 %
EOS ABS: 0.6 10*3/uL (ref 0.0–0.7)
Eosinophils Relative: 5 %
HCT: 30.9 % — ABNORMAL LOW (ref 36.0–46.0)
HEMOGLOBIN: 10.1 g/dL — AB (ref 12.0–15.0)
LYMPHS PCT: 7 %
Lymphs Abs: 0.8 10*3/uL (ref 0.7–4.0)
MCH: 27.6 pg (ref 26.0–34.0)
MCHC: 32.7 g/dL (ref 30.0–36.0)
MCV: 84.4 fL (ref 78.0–100.0)
Monocytes Absolute: 1 10*3/uL (ref 0.1–1.0)
Monocytes Relative: 9 %
NEUTROS ABS: 9 10*3/uL — AB (ref 1.7–7.7)
NEUTROS PCT: 79 %
Platelets: 356 10*3/uL (ref 150–400)
RBC: 3.66 MIL/uL — ABNORMAL LOW (ref 3.87–5.11)
RDW: 14.7 % (ref 11.5–15.5)
WBC: 11.4 10*3/uL — AB (ref 4.0–10.5)

## 2015-08-05 LAB — ANAEROBIC CULTURE: Special Requests: NORMAL

## 2015-08-05 LAB — MAGNESIUM: Magnesium: 1.8 mg/dL (ref 1.7–2.4)

## 2015-08-05 LAB — GLUCOSE, CAPILLARY
GLUCOSE-CAPILLARY: 57 mg/dL — AB (ref 65–99)
GLUCOSE-CAPILLARY: 104 mg/dL — AB (ref 65–99)

## 2015-08-05 LAB — URINE CULTURE: CULTURE: NO GROWTH

## 2015-08-05 LAB — PHOSPHORUS: PHOSPHORUS: 4.4 mg/dL (ref 2.5–4.6)

## 2015-08-05 LAB — TRIGLYCERIDES: Triglycerides: 167 mg/dL — ABNORMAL HIGH (ref <150)

## 2015-08-05 MED ORDER — FENTANYL CITRATE (PF) 100 MCG/2ML IJ SOLN
INTRAMUSCULAR | Status: AC
  Filled 2015-08-05: qty 2

## 2015-08-05 MED ORDER — POTASSIUM CHLORIDE 10 MEQ/100ML IV SOLN
10.0000 meq | INTRAVENOUS | Status: AC
  Administered 2015-08-05 (×4): 10 meq via INTRAVENOUS
  Filled 2015-08-05 (×4): qty 100

## 2015-08-05 MED ORDER — MIDAZOLAM HCL 2 MG/2ML IJ SOLN
INTRAMUSCULAR | Status: AC
  Filled 2015-08-05: qty 4

## 2015-08-05 MED ORDER — TRACE MINERALS CR-CU-MN-SE-ZN 10-1000-500-60 MCG/ML IV SOLN
INTRAVENOUS | Status: AC
  Administered 2015-08-05: 18:00:00 via INTRAVENOUS
  Filled 2015-08-05: qty 960

## 2015-08-05 MED ORDER — INSULIN ASPART 100 UNIT/ML ~~LOC~~ SOLN
0.0000 [IU] | Freq: Four times a day (QID) | SUBCUTANEOUS | Status: DC
  Administered 2015-08-06 – 2015-08-18 (×15): 2 [IU] via SUBCUTANEOUS

## 2015-08-05 MED ORDER — DEXTROSE 50 % IV SOLN
25.0000 mL | Freq: Once | INTRAVENOUS | Status: AC
  Administered 2015-08-05: 25 mL via INTRAVENOUS
  Filled 2015-08-05: qty 50

## 2015-08-05 MED ORDER — SODIUM CHLORIDE 0.9% FLUSH
10.0000 mL | INTRAVENOUS | Status: DC | PRN
  Administered 2015-08-20 – 2015-08-27 (×3): 10 mL
  Filled 2015-08-05 (×3): qty 40

## 2015-08-05 MED ORDER — FAT EMULSION 20 % IV EMUL
250.0000 mL | INTRAVENOUS | Status: AC
  Administered 2015-08-05: 250 mL via INTRAVENOUS
  Filled 2015-08-05: qty 250

## 2015-08-05 NOTE — Progress Notes (Addendum)
Initial Nutrition Assessment  DOCUMENTATION CODES:   Not applicable  INTERVENTION:  -TPN per pharmacy -RD to continue to monitor for needs  NUTRITION DIAGNOSIS:   Inadequate oral intake related to altered GI function as evidenced by per patient/family report.  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 Patient had presented to the ED with complaints of 1-2 day history of diffuse abdominal pain.   Erin Santiago ended up having a bowel perforation, possibly secondary to tumor invasion into her bowel. Per PA note, 426mL have been drained from R L quadrant and pelvis, via IR. She is to undergo follow up CT today.  Spoke with pt at bedside briefly. RD consulted for new TPN. She endorses a good appetite, no weight loss. Stated her eating habits are erratic but she normally eats 3 solid meals per day. She complains of being unable to taste sweet foods s/p her first round of chemo. She said her ability to taste sweets didn't return for 6 months. She was supposed to start chemo upon admission, but has been held for now.  Plan per pharmacy: At 1800 today:  Start Clinimix E5/15 at 78ml/hr.  20% fat emulsion at 10 ml/hr.  Plan to advance as tolerated to goal rate 29ml/hr   Labs: K 3.0, Ca 8.1 Medications: Morphine PRN  Diet Order:  Diet NPO time specified TPN (CLINIMIX-E) Adult  Skin:  Reviewed, no issues  Last BM:  2/23  Height:   Ht Readings from Last 1 Encounters:  07/30/15 5' (1.524 m)    Weight:   Wt Readings from Last 1 Encounters:  08/05/15 216 lb 11.4 oz (98.3 kg)    Ideal  Body Weight:  45.45 kg  BMI:  Body mass index is 42.32 kg/(m^2).  Estimated Nutritional Needs:   Kcal:  1750-2050 (30-35 cal/kg ABW)  Protein:  120-150 grams (1.2-1.5g/kg)  Fluid:  >/= 1.5L  EDUCATION NEEDS:   No education needs identified at this time   Satira Anis. Erin Peachey, MS, RD LDN After Hours/Weekend Pager [phone]

## 2015-08-05 NOTE — Progress Notes (Signed)
[facility] Progress Note  Patient Care Team: Merrilee Seashore, MD as PCP - General (Internal Medicine) Vivien Rossetti, MD as Consulting Physician (Oncology) Gordy Levan, MD as Consulting Physician (Oncology) Joyia Riehle Salon, MD as Consulting Physician (Gynecology)     Subjective: Pt feels much better after 2 doses of lasix yesterday.  No N/V, no more abdominal pain like yesterday.  Up OOB.  Urinated quite frequently yesterday.  Hasn't asked for pain medication.  Feels less bloated.  IS up to 1250.  Objective: Vital signs in last 24 hours: Temp:  [98.3 F (36.8 C)-100 F (37.8 C)] 98.4 F (36.9 C) (03/01 0303) Resp:  [16-26] 20 (03/01 0600) BP: (120-150)/(68-86) 120/68 mmHg (03/01 0600) SpO2:  [92 %-99 %] 95 % (03/01 0600) Weight:  [98.3 kg (216 lb 11.4 oz)] 98.3 kg (216 lb 11.4 oz) (03/01 0500) Last BM Date: 07/30/15  Intake/Output from previous day: 02/28 0701 - 03/01 0700 In: 569 [IV Piggyback:464] Out: 5325 PA:[id number]; Drains:450] Intake/Output this shift:    PE: Gen: Alert, NAD, pleasant Card: RRR, no M/G/R heard Pulm: CTA, no W/R/R, good effort, IS up to 1250 Abd: Soft, less distended, NT, +BS, no HSM,drain with feculent foul smelling drainage (433mL/24hr) Ext: Extremity swelling improved  Lab Results:   Recent Labs  08/04/15 0508 08/05/15 0506  WBC 9.6 11.4*  HGB 10.0* 10.1*  HCT 30.0* 30.9*  PLT 301 356   BMET  Recent Labs  08/04/15 0508 08/05/15 0506  NA 139 135  K 4.1 3.0*  CL 104 93*  CO2 26 27  GLUCOSE 91 77  BUN 6 6  CREATININE 0.50 0.80  CALCIUM 8.2* 8.1*   PT/INR No results for input(s): LABPROT, INR in the last 72 hours. CMP     Component Value Date/Time   NA 135 08/05/2015 0506   NA 140 07/17/2015 1058   K 3.0* 08/05/2015 0506   K 4.1 07/17/2015 1058   CL 93* 08/05/2015 0506   CO2 27 08/05/2015 0506   CO2 23 07/17/2015 1058   GLUCOSE 77 08/05/2015 0506   GLUCOSE 93 07/17/2015 1058   BUN 6  08/05/2015 0506   BUN 18.4 07/17/2015 1058   CREATININE 0.80 08/05/2015 0506   CREATININE 0.8 07/17/2015 1058   CALCIUM 8.1* 08/05/2015 0506   CALCIUM 9.2 07/17/2015 1058   PROT 6.2* 08/05/2015 0506   PROT 7.1 07/17/2015 1058   ALBUMIN 2.5* 08/05/2015 0506   ALBUMIN 3.5 07/17/2015 1058   AST 11* 08/05/2015 0506   AST 18 07/17/2015 1058   ALT 10* 08/05/2015 0506   ALT 12 07/17/2015 1058   ALKPHOS 48 08/05/2015 0506   ALKPHOS 87 07/17/2015 1058   BILITOT 2.6* 08/05/2015 0506   BILITOT 0.85 07/17/2015 1058   GFRNONAA >60 08/05/2015 0506   GFRAA >60 08/05/2015 0506   Lipase     Component Value Date/Time   LIPASE 18 07/30/2015 1251       Studies/Results: Ct Abdomen Pelvis W Contrast  08/04/2015  CLINICAL DATA:  "Has felt much worse since last pm "worse than I ever felt with the cancer". Drowsy with IV morphine but pain in abdomen not resolved with 1 mg dose last 0500. Pain is diffuse in abdomen and now particularly left lateral upper abdomen, which was "hot and red" last pm; not as tender in mid abdomen as on admission. Increased drainage which appears fecal from RLQ drain. Passed small flatus this AM, no BM. Nausea intermittently including now, vomited x 1 last pm.  Febrile now, no shaking chills. Hands puffy. Reports bladder pressure and dysuria. Has been getting up to [facility]. Rash thigh and lower abdomen possibly from toradol, resolved. Coughs with tries to breathe deeply" Hx endometrial sarcoma  Dx'd 07/2014 EXAM: CT ABDOMEN AND PELVIS WITH CONTRAST TECHNIQUE: Multidetector CT imaging of the abdomen and pelvis was performed using the standard protocol following bolus administration of intravenous contrast. CONTRAST:  174mL OMNIPAQUE IOHEXOL 300 MG/ML  SOLN COMPARISON:  07/30/2015 FINDINGS: Lung bases: Minimal pleural effusions, greater on the left. Lung base atelectasis. No convincing pneumonia or edema. Effusions are new since the prior study. Atelectasis is similar. Liver, spleen,  gallbladder, pancreas, adrenal glands:  Unremarkable. Kidneys, ureters, bladder: No renal masses. Symmetric renal enhancement and excretion. Mild bilateral renal collecting system dilation. Ureters are normal in course and in caliber. Bladder is unremarkable. Uterus:  Surgically absent. Gastrointestinal: The right lower quadrant collection has been partly decompressed with the pigtail catheter. It measures 3.2 cm in thickness where it had measured 4.5 cm. There is still dependent fluid and nondependent air. The portion of the collection that its more medial and inferior, containing the pigtail portion of the catheter, measures approximately 4.8 x 5.1 cm, previously approximately 3.7 x 3.2 cm. Posterior to this there is extraluminal fluid that contains contrast. It extends from the right lower quadrant adjacent to small bowel loops to the posterior pelvic recess. It measures 7.6 x 4.7 cm transversely, previously 6.6 x 3.5 cm. There is no residual free intraperitoneal air. There are no new collections. Inflammatory haziness in straining lies within the fat of the right lower quadrant and pelvis. Colon is mostly decompressed. There are diverticula mostly along the left colon without evidence of diverticulitis. A normal appendix is visualized. There is mild wall thickening of loops of small bowel adjacent to the right lower quadrant collections. Remainder of the small bowel is normal in caliber with no wall thickening. Stomach shows chronic changes from previous gastric surgery, stable. Lymph nodes: There are enlarged heterogeneous retroperitoneal lymph nodes. An aortocaval node just above the bifurcation of the aorta measures 3.7 x 3.3 cm, without change from the prior study. A left common iliac chain node measures 2.2 cm, also without significant change. There are additional enlarged nodes extending inferior from this, stable. A right external iliac chain node measures 17 mm in short axis, also unchanged.  Musculoskeletal: No osteoblastic or osteolytic lesions. Stable degenerative changes of the visualized spine. IMPRESSION: 1. Since the prior CT, percutaneous drainage of the right lower quadrant collection has been performed. Although the lateral portion of this collection has decreased in size, the more medial and inferior portion is larger. In addition, the collection extending from the right lower quadrant into the posterior pelvic recess has increased in size. It contains contrast reflecting continued communication with bowel. There are no new collections and there is no residual free air. Persistent inflammatory change surrounds the collections in the right lower quadrant small bowel. 2. Minimal pleural effusions, new since prior study. Lung base atelectasis is similar to the prior exam. 3. No other changes.  Stable metastatic adenopathy. Electronically Signed   By: Lajean Manes M.D.   On: 08/04/2015 16:34    Anti-infectives: Anti-infectives    Start     Dose/Rate Route Frequency Ordered Stop   08/05/15 1600  anidulafungin (ERAXIS) 100 mg in sodium chloride 0.9 % 100 mL IVPB     100 mg over 90 Minutes Intravenous Every 24 hours 08/04/15 1509 08/12/15 1559  08/04/15 1600  anidulafungin (ERAXIS) 200 mg in sodium chloride 0.9 % 200 mL IVPB    Comments:  Pharmacy may adjust dosing strength, schedule, rate of infusion, etc as needed to optimize therapy   200 mg over 180 Minutes Intravenous  Once 08/04/15 1509 08/04/15 1928   07/30/15 1645  piperacillin-tazobactam (ZOSYN) IVPB 3.375 g     3.375 g 12.5 mL/hr over 240 Minutes Intravenous 3 times per day 07/30/15 1634     07/30/15 1545  piperacillin-tazobactam (ZOSYN) IVPB 3.375 g     3.375 g 100 mL/hr over 30 Minutes Intravenous  Once 07/30/15 1541 07/30/15 1757       Assessment/Plan 60 year old female with a history of 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 Complex 9x4cm fluid collection -Mild elevation in WBC, no more fevers, drained 437mL/24hr out of IR drain, pain improved -Repeat CT scan shows small resolution in lateral aspect of abscess, but medial and inferior aspects are larger -Will see if IR will either upsize drain, reposition it, or place additional drains -Labs in AM -NPO for possible IR procedure today -No current plans for operation, no additional free air, patient is stable, less pain -Plan to recheck CT scan in 1 week -Ambulate and IS -SCD's and lovenox or heparin are okay from our perspective if no indications for surgery or perc drain today  FEN-NPO, start TPN ID-Leukocytosis, Zosyn Day #7 for e. coli, pan sensitive, ? cipro at discharge, added Eraxis for anti-fungal coverage Dispo-Consider input with [facility] Vivien Rossetti, MD as far as further Tx.    LOS: 6 days    Nat Christen 08/05/2015, 7:40 AM Pager: [phone]  (7am - 4:30pm M-F; 7am - 11:30am Sa/Su)

## 2015-08-05 NOTE — Progress Notes (Signed)
Patient ID: Erin Santiago, female   DOB: 24-Jan-1956, 60 y.o.   MRN: [id number]    Referring Physician(s): [facility]  Supervising Physician: Arne Cleveland  Chief Complaint: Right lower quadrant abdominal abscess   Subjective: Patient states that she feels better today. She denies significant abdominal pain, nausea vomiting , respiratory difficulties, diarrhea or abnormal bleeding. She has ambulated in hallway.   Allergies: Review of patient's allergies indicates no known allergies.  Medications: Prior to Admission medications   Medication Sig Start Date End Date Taking? Authorizing Provider  calcium carbonate (TUMS - DOSED IN MG ELEMENTAL CALCIUM) 500 MG chewable tablet Chew 2 tablets by mouth daily.    Yes Historical Provider, MD  dexamethasone (DECADRON) 4 MG tablet Take 2 tablets by mouth once a day on the day after chemotherapy and then take 2 tablets two times a day for 2 days. Take with food. 07/27/15  Yes Lennis Marion Downer, MD  docusate sodium (COLACE) 100 MG capsule Take 100 mg by mouth 2 (two) times daily as needed for mild constipation. Reported on 05/28/2015 07/17/14  Yes Historical Provider, MD  hydrocortisone 2.5 % cream Apply 1 application topically as needed (itch/psoriasis.). Reported on 07/17/2015 05/19/14  Yes Historical Provider, MD  lidocaine-prilocaine (EMLA) cream Apply to Porta-cath 1-2 hrs prior to access. Cover with [employer]. 08/18/14  Yes Lennis Marion Downer, MD  LORazepam (ATIVAN) 1 MG tablet Place 1/2 to 1 tablet under the tongue or swallow every 6 hours as needed for nausea 07/22/15  Yes Lennis P Livesay, MD  meloxicam (MOBIC) 15 MG tablet Take 15 mg by mouth daily.  06/09/14  Yes Historical Provider, MD  ondansetron (ZOFRAN) 8 MG tablet Take 1 tablet (8 mg total) by mouth every 8 (eight) hours as needed for nausea or vomiting. 07/22/15  Yes Lennis Marion Downer, MD  prochlorperazine (COMPAZINE) 10 MG tablet Take 1 tablet (10 mg total) by mouth every 6 (six) hours as needed  (Nausea or vomiting). 07/27/15  Yes Lennis Marion Downer, MD  Psyllium 28.3 % POWD Take 1 packet by mouth daily as needed (for fiber). Reported on 05/26/2015   Yes Historical Provider, MD  Resveratrol 250 MG CAPS Take 250 mg by mouth 2 (two) times daily. Reported on 05/26/2015   Yes Historical Provider, MD  ferrous fumarate (HEMOCYTE) 325 (106 FE) MG TABS tablet Take 1 tab daily on an empty stomach with OJ or Vitamin C 500 mg Patient not taking: Reported on 07/09/2015 09/15/14   Lennis Marion Downer, MD  sucralfate (CARAFATE) 1 GM/10ML suspension Take 10 mLs (1 g total) by mouth 4 (four) times daily -  with meals and at bedtime. Patient not taking: Reported on 07/30/2015 05/20/15   [name], MD     Vital Signs: BP 139/73 mmHg  Pulse 95  Temp(Src) 98.4 F (36.9 C) (Oral)  Resp 26  Ht 5' (1.524 m)  Wt 216 lb 11.4 oz (98.3 kg)  BMI 42.32 kg/m2  SpO2 100%  LMP 02/04/2014  Physical Exam patient awake, alert. Chest with diminished breath sounds at bases, slightly greater on left; heart with regular rate and rhythm. Abdomen soft, positive bowel sounds, intact right lower quadrant drain with feculent output (about 500 cc last 24-36 hrs); insertion site okay, mildly tender to palpation.  Imaging: Ct Abdomen Pelvis W Contrast  08/04/2015  CLINICAL DATA:  "Has felt much worse since last pm "worse than I ever felt with the cancer". Drowsy with IV morphine but pain in abdomen not resolved with 1  mg dose last 0500. Pain is diffuse in abdomen and now particularly left lateral upper abdomen, which was "hot and red" last pm; not as tender in mid abdomen as on admission. Increased drainage which appears fecal from RLQ drain. Passed small flatus this AM, no BM. Nausea intermittently including now, vomited x 1 last pm. Febrile now, no shaking chills. Hands puffy. Reports bladder pressure and dysuria. Has been getting up to [facility]. Rash thigh and lower abdomen possibly from toradol, resolved. Coughs with tries to breathe  deeply" Hx endometrial sarcoma  Dx'd 07/2014 EXAM: CT ABDOMEN AND PELVIS WITH CONTRAST TECHNIQUE: Multidetector CT imaging of the abdomen and pelvis was performed using the standard protocol following bolus administration of intravenous contrast. CONTRAST:  12m OMNIPAQUE IOHEXOL 300 MG/ML  SOLN COMPARISON:  07/30/2015 FINDINGS: Lung bases: Minimal pleural effusions, greater on the left. Lung base atelectasis. No convincing pneumonia or edema. Effusions are new since the prior study. Atelectasis is similar. Liver, spleen, gallbladder, pancreas, adrenal glands:  Unremarkable. Kidneys, ureters, bladder: No renal masses. Symmetric renal enhancement and excretion. Mild bilateral renal collecting system dilation. Ureters are normal in course and in caliber. Bladder is unremarkable. Uterus:  Surgically absent. Gastrointestinal: The right lower quadrant collection has been partly decompressed with the pigtail catheter. It measures 3.2 cm in thickness where it had measured 4.5 cm. There is still dependent fluid and nondependent air. The portion of the collection that its more medial and inferior, containing the pigtail portion of the catheter, measures approximately 4.8 x 5.1 cm, previously approximately 3.7 x 3.2 cm. Posterior to this there is extraluminal fluid that contains contrast. It extends from the right lower quadrant adjacent to small bowel loops to the posterior pelvic recess. It measures 7.6 x 4.7 cm transversely, previously 6.6 x 3.5 cm. There is no residual free intraperitoneal air. There are no new collections. Inflammatory haziness in straining lies within the fat of the right lower quadrant and pelvis. Colon is mostly decompressed. There are diverticula mostly along the left colon without evidence of diverticulitis. A normal appendix is visualized. There is mild wall thickening of loops of small bowel adjacent to the right lower quadrant collections. Remainder of the small bowel is normal in caliber with no  wall thickening. Stomach shows chronic changes from previous gastric surgery, stable. Lymph nodes: There are enlarged heterogeneous retroperitoneal lymph nodes. An aortocaval node just above the bifurcation of the aorta measures 3.7 x 3.3 cm, without change from the prior study. A left common iliac chain node measures 2.2 cm, also without significant change. There are additional enlarged nodes extending inferior from this, stable. A right external iliac chain node measures 17 mm in short axis, also unchanged. Musculoskeletal: No osteoblastic or osteolytic lesions. Stable degenerative changes of the visualized spine. IMPRESSION: 1. Since the prior CT, percutaneous drainage of the right lower quadrant collection has been performed. Although the lateral portion of this collection has decreased in size, the more medial and inferior portion is larger. In addition, the collection extending from the right lower quadrant into the posterior pelvic recess has increased in size. It contains contrast reflecting continued communication with bowel. There are no new collections and there is no residual free air. Persistent inflammatory change surrounds the collections in the right lower quadrant small bowel. 2. Minimal pleural effusions, new since prior study. Lung base atelectasis is similar to the prior exam. 3. No other changes.  Stable metastatic adenopathy. Electronically Signed   By: DLajean ManesM.D.   On:  08/04/2015 16:34    Labs:  CBC:  Recent Labs  08/02/15 0430 08/03/15 0425 08/04/15 0508 08/05/15 0506  WBC 10.0 8.7 9.6 11.4*  HGB 9.9* 9.9* 10.0* 10.1*  HCT 30.1* 29.8* 30.0* 30.9*  PLT 230 262 301 356    COAGS:  Recent Labs  08/26/14 1300 07/30/15 2242 07/31/15 0428  INR 0.95 1.49 1.41  APTT 27  --  32    BMP:  Recent Labs  08/02/15 0430 08/03/15 0425 08/04/15 0508 08/05/15 0506  NA 140 137 139 135  K 3.2* 4.4 4.1 3.0*  CL 104 103 104 93*  CO2 _0 GLUCOSE 89 82 91 77    BUN _1 CALCIUM 8.1* 7.7* 8.2* 8.1*  CREATININE 0.70 0.60 0.50 0.80  GFRNONAA >60 >60 >60 >60  GFRAA >60 >60 >60 >60    LIVER FUNCTION TESTS:  Recent Labs  07/31/15 0428 08/01/15 0509 08/03/15 0425 08/05/15 0506  BILITOT 2.4* 2.9* 2.8* 2.6*  AST 16 13* 11* 11*  ALT 11* 9* 8* 10*  ALKPHOS 48 50 52 48  PROT 5.6* 5.5* 5.4* 6.2*  ALBUMIN 2.7* 2.4* 2.3* 2.5*    Assessment and Plan: Met endom ca; s/p drainage of RLQ abd abscess 2/24; currently afebrile; drain fluid cultures with Escherichia coli; WBC up slightly at 11.4; hemoglobin stable; creatinine 0.8; follow-up CT scan of abdomen and pelvis from 228 reviewed. There has been a noted decrease in size of the lateral aspect of the drain collection but slight enlargement of the more medial and inferior portion of the collection. Also a collection extending from the right lower quadrant into the posterior pelvic recess which is increased in size slightly. As collection contains contrast reflecting continued communication with bowel. There were no new collections or residual free air. Request has been received from [facility] for consideration of additional drain into right lower quadrant collection. Dr. Johney Maine to speak with Dr. Vernard Gambles regarding this matter and decision to be made shortly. Tentative plans for repeat drainage have been discussed with patient with her understanding and consent.   Electronically Signed: D. Rowe Robert 08/05/2015, 10:32 AM   I spent a total of 15 minutes at the the patient's bedside AND on the patient's hospital floor or unit, greater than 50% of which was counseling/coordinating care for right lower quadrant drain,

## 2015-08-05 NOTE — Progress Notes (Signed)
PARENTERAL NUTRITION CONSULT NOTE - INITIAL  Pharmacy Consult for TPN Indication: intolerant to enteral feeds  No Known Allergies  Patient Measurements: Height: 5' (152.4 cm) Weight: 216 lb 11.4 oz (98.3 kg) IBW/kg (Calculated) : 45.5 Adjusted Body Weight: 66.6 kg Usual Weight: ~98kg  Vital Signs: Temp: 98.4 F (36.9 C) (03/01 0303) Temp Source: Oral (03/01 0303) BP: 120/68 mmHg (03/01 0600) Intake/Output from previous day: 02/28 0701 - 03/01 0700 In: 569 [IV Piggyback:464] Out: [id number] [Urine:4875; Drains:450] Intake/Output from this shift:    Labs:  Recent Labs  08/03/15 0425 08/04/15 0508 08/05/15 0506  WBC 8.7 9.6 11.4*  HGB 9.9* 10.0* 10.1*  HCT 29.8* 30.0* 30.9*  PLT 262 301 356     Recent Labs  08/03/15 0425 08/04/15 0508 08/05/15 0506  NA 137 139 135  K 4.4 4.1 3.0*  CL 103 104 93*  CO2 25 26 27   GLUCOSE 82 91 77  BUN 8 6 6   CREATININE 0.60 0.50 0.80  CALCIUM 7.7* 8.2* 8.1*  MG 2.0  --  1.8  PROT 5.4*  --  6.2*  ALBUMIN 2.3*  --  2.5*  AST 11*  --  11*  ALT 8*  --  10*  ALKPHOS 52  --  48  BILITOT 2.8*  --  2.6*   Estimated Creatinine Clearance: 78.6 mL/min (by C-G formula based on Cr of 0.8).   No results for input(s): GLUCAP in the last 72 hours.  Medical History: Past Medical History  Diagnosis Date  . Abnormal uterine bleeding   . Anemia   . Fibroid   . Heart murmur     under 5 yrs of age  . Cancer ([place]) dx'd 07/2014    endometrial stromal sarcoma  . Radiation 12/01/14-01/05/15    pelvis 45 gray  . Radiation 05/18/2015-06/05/15    T4 thoracic spine area 35 gray    Assessment: 60 yo F admitted 2/23 with abdominal pain and noted to have perforated bowel.  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  She has been on Zosyn since admission for IAI and Eraxis was added 2/28.    Clear liquid diet was attempted 2/25 however patient developed severe abdominal pain with nausea/vomiting.    Insulin Requirements in the past 24 hours:  None  Current Nutrition:  NPO  IVF: none  Central access: implanted port 11/19/14 TPN start date: 08/05/15  ASSESSMENT                                                                                                          HPI: 60 yo F admitted 2/23 with abdominal pain and noted to have perforated bowel.  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 She has been on Zosyn since admission for IAI and Eraxis was added 2/28.   Clear liquid diet was attempted  2/25 however patient developed severe abdominal pain with nausea/vomiting.    Significant events:  3/1: IR consulted for drain adjustment  Today:    Glucose - WNL.  No hx DM.  Electrolytes - K+ low (3.0), Cl low (93), all other electrolytes wnl.    Renal - stable at patient's baseline.  Estimated CrCl ~ 75-80 ml/min (CG)  LFTs - TBili elevated but trending down (2.6).  Patient does not appear jaundiced.   TGs - pending  Prealbumin - pending  NUTRITIONAL GOALS                                                                                             Goals (RD recommendations pending): 1650-2300 kCal/d, 100-134Gm/D; ~2L Fluid/day Clinimix E 5/15 at a goal rate of 83 ml/hr + 20% fat emulsion at 10 ml/hr to provide: 100g/day protein, 1894 Kcal/day.  PLAN                                                                                                                         At 1800 today:  Start Clinimix E5/15 at 30ml/hr.  20% fat emulsion at 10 ml/hr.  Plan to advance as tolerated to the goal rate.  TPN to contain standard multivitamins and trace elements.  Add CBG checks q6h with moderate correction scale if  needed.   TPN lab panels on Mondays & Thursdays.  Replete potassium with IV KCl runs x 4.   F/u daily.   Biagio Borg 08/05/2015,8:43 AM

## 2015-08-05 NOTE — Evaluation (Signed)
Occupational Therapy Evaluation Patient Details Name: Erin Santiago MRN: [id number] DOB: March 04, 1956 Today's Date: 08/05/2015    History of Present Illness 60 yo female admitted 2/23 with abdominal pain, h/o endometrial stromal sarcome, underwent surgery in February. S/P placement  of drain for abcess.   Clinical Impression   Pt was admitted for the above. 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 Pt currently needs min A for adls and will benefit from skilled OT. Goals in acute are for set up to mod I    Follow Up Recommendations  Home health OT;Supervision/Assistance - 24 hour    Equipment Recommendations  3 in 1 bedside comode    Recommendations for Other Services       Precautions / Restrictions Precautions Precautions: Fall Precaution Comments: R knee twisted PTA. R drain in abd. Restrictions Weight Bearing Restrictions: No      Mobility Bed Mobility               General bed mobility comments: oob  Transfers Overall transfer level: Needs assistance Equipment used: Rolling walker (2 wheeled)   Sit to Stand: Supervision         General transfer comment: assistance for lines    Balance                                            ADL Overall ADL's : Needs assistance/impaired     Grooming: Set up;Sitting       Lower Body Bathing: Min guard;Sit to/from stand       Lower Body Dressing: Minimal assistance;Sit to/from stand   Toilet Transfer: Min guard;Ambulation;BSC   Toileting- Clothing Manipulation and Hygiene: Minimal assistance;Sit to/from stand         General ADL Comments: pt has difficulty reaching around backside for toilet hygiene.  Able to reach comfortably to feet, even with drain in place     Vision     Perception     Praxis      Pertinent Vitals/Pain Pain Assessment: No/denies pain     Hand Dominance     Extremity/Trunk Assessment Upper Extremity Assessment Upper Extremity  Assessment: Overall WFL for tasks assessed           Communication Communication Communication: No difficulties   Cognition Arousal/Alertness: Awake/alert Behavior During Therapy: WFL for tasks assessed/performed   Area of Impairment: Attention   Current Attention Level: Selective           General Comments: had difficulty holding thoughts at times, may be medication/fatique   General Comments       Exercises       Shoulder Instructions      Home Living Family/patient expects to be discharged to:: Private residence Living Arrangements: Alone Available Help at Discharge: Friend(s);Available PRN/intermittently               Bathroom Shower/Tub: Walk-in shower   Bathroom Toilet: Standard         Additional Comments: pt states she can have as much assistance as needed. She has sheep on farm. She has life alert for inside and outdoors      Prior Functioning/Environment Level of Independence: Independent             OT Diagnosis: Generalized weakness   OT Problem List: Decreased strength;Decreased activity tolerance;Decreased knowledge of use of DME or AE;Impaired balance (sitting and/or  standing)   OT Treatment/Interventions: Self-care/ADL training;DME and/or AE instruction;Balance training;Patient/family education    OT Goals(Current goals can be found in the care plan section) Acute Rehab OT Goals Patient Stated Goal: to take care of my sheep. OT Goal Formulation: With patient Time For Goal Achievement: 08/19/15 Potential to Achieve Goals: Good ADL Goals Pt Will Perform Lower Body Bathing: with set-up;sit to/from stand Pt Will Perform Lower Body Dressing: with set-up;sit to/from stand;with adaptive equipment (as needed) Pt Will Transfer to Toilet: with modified independence;ambulating;bedside commode Pt Will Perform Toileting - Clothing Manipulation and hygiene: with modified independence;sit to/from stand (with toilet aide, if needed) Pt Will  Perform Tub/Shower Transfer: Shower transfer;ambulating;shower seat;with supervision  OT Frequency: Min 2X/week   Barriers to D/C:            Co-evaluation              End of Session    Activity Tolerance: Patient tolerated treatment well Patient left: in chair;with call bell/phone within reach   Time: [id number] OT Time Calculation (min): 25 min Charges:  OT General Charges $OT Visit: 1 Procedure OT Evaluation $OT Eval Moderate Complexity: 1 Procedure OT Treatments $Self Care/Home Management : 8-22 mins G-Codes:    Damain Broadus 29-Aug-2015, 10:33 AM  Lesle Chris, OTR/L [phone] August 29, 2015

## 2015-08-05 NOTE — Progress Notes (Signed)
[facility] PROGRESS NOTE  Erin Santiago [id number] DOB: [birth year] DOA: 07/30/2015 PCP: Merrilee Seashore, MD  Brief interval history  Erin Santiago is a 60 y.o. 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 Patient is status post chemotherapy and radiation to the pelvis and back and was due to start chemotherapy on the day of admission due to progression of disease involving the left abdominal pain. Patient had presented to the ED with complaints of 1-2 day history of diffuse abdominal pain. Patient stated that she woke up around 2 AM one day prior to admission with significant diffuse abdominal pain and when she stood up to go through her bathroom she lost control of her bladder due to probable incontinence. Patient stated that the whole day prior to admission was lethargic and very sleepy and slept all day. Patient does endorse subjective fevers, chills, nausea, emesis, generalized weakness, diarrhea. Patient also endorses some hematochezia. Patient denies chest pain, no constipation, no dysuria, no hematemesis, no melena. Patient does endorse an episode of hematemesis. Patient denies any cough. Patient was seen in the emergency room comprehensive metabolic profile done at a sodium of 133 bicarbonate of 19 BUN of 29 creatinine of 1.06 glucose of 125 AST of 13 ALT of 23 protein of 7.5 bilirubin of 3.4 otherwise is within normal limits. Initial lactic acid level is elevated at 2.67 and repeat lactic acid level was down to 0.93. CBC had a white count of 11.1 with no other abnormalities. Urinalysis pending. Acute abdominal series showed dilated small bowel loops containing residual contrast in the midabdomen most likely represent a component of small bowel obstruction. Bibasilar atelectasis. CT abdomen and pelvis did show bowel perforation.  Large 9 x 4.5 cm complex right lower quadrant and pelvic abscess containing a small amount of contrast suggesting communication with the distal ileum. This could be due to tumor erosion. Stable adenopathy in the abdomen and pelvis. Patient was seen in consultation by general surgery who recommended initially nonoperative percutaneous drainage and admission per hospitalist.  Patient was admitted to the stepdown unit general surgery was consulted patient was placed on bowel rest and monitored. Interventional radiology was also consulted for drain placement. Patient had a percutaneous drain placed on 07/31/2015 and was placed empirically on IV Zosyn from admission. Wound/abscess cultures grew out Escherichia coli. Patient was monitored. Patient's pain was controlled on pain medication. Oncology also assess the patient. Patient was started on clear liquids and as diet was advanced patient was noted to have abdominal pain with a fever and emesis. Patient's diet has subsequently been changed back to clear liquids. Repeat CT abdomen and pelvis pending for 08/04/2015. Will keep an stepdown unit. If patient deteriorates will likely need emergency exploratory laparotomy.      Assessment/Plan: #1 bowel perforation, complex intra-abdominal fluid collection 9 x 4.5 cm right lower quadrant and pelvic Questionable etiology. Patient with fever overnight with emesis when she tried oral intake of food. Patient states no significant improvement with abdominal pain since yesterday 08/03/2015. Possible invasion of cancer/ metastatic endometrial cancer into bowel. Per CT abdomen and pelvis looks like perforation may have been walled off and contained. Blood cultures pending. Patient currently afebrile. Leukocytosis trending down. Continue empiric IV Zosyn. Patient has been seen in consultation by general surgery will recommending nonoperative percutaneous drainage placement. S/p RLQ percutaneous drain 07/31/2015 per IR.   Wound/abscess cultures growing Escherichia coli.  -  Repeat CT scan of abdomen and pelvis performed on 07/27/2015 show a decrease in the size of fluid collection in the lateral portion however more medially it's larger. Radios reported that the collection extending from the right lower quadrant into the posterior pelvic wheezes has also increased in size. This contains contrast. -Gen. surgery and interventional radiology following. -Clinically she reports having some improvement today, she had a MAXIMUM TEMPERATURE of 100 in the past 24 hours. CBC showing mild left shift.   -Blood cultures obtained on 07/30/2015 show no growth 2  -she remains on Zosyn 3.375 g IV 3 times a day.Await further recommendations.   #2 leukocytosis Likely secondary to problem #1. Blood cultures pending. Urinalysis nitrite negative leukocytes negative. WBC trending down. Abscess cultures growing Escherichia coli which is pansensitive. Continue empiric IV Zosyn.  #3 history of iron deficiency anemia Hemoglobin dropped to 10.0 from 13.8 on admission, however has remained stable. Likely dilutional component as patient on IV fluids.  Hemoglobin currently at 10. NSL IVF.  Follow.  #4 dehydration/metabolic acidosis  NSL IVF. Metabolic acidosis resolved. Bicarbonate drip has been discontinued. Patient with some bibasilar crackles and a such will give Lasix 40 mg IV 1.  #5 hypokalemia -A.m. labs showing potassium of 3.0, replace with IV potassium, all up on a.m. lab work.  Repleted.Keep magnesium > 2.  #6 lactic acidosis Likely secondary to problem #1 and dehydration. Lactic acid levels trending down. Patient has been pancultured. Abscess growing Angus which is pan sensitive. Continue empiric IV Zosyn.  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 Patient status post chemotherapy and radiation to  the pelvis. Patient was to start chemotherapy on the day of admission due to progression of disease involving the left, iliac adenopathy, 2 masses in the right lower quadrant seen on CT scan on 07/21/2015.  Oncology following.  #8 prophylaxis SCDs for DVT prophylaxis.   Code Status: Full Family Communication: Updated patient. No family at bedside. Disposition Plan: Remain in the step down unit.   Consultants:  Gen. surgery: Dr. Excell Seltzer 07/30/2015   oncology: Dr. Marko Plume 07/31/2015  Interventional radiology  Procedures:  CT abdomen and pelvis 07/30/2015  Acute abdominal series 07/30/2015  Right lower quadrant abdominal abscess drain with CT 07/31/2015 for Dr. Annamaria Boots  Antibiotics:  IV Zosyn 07/30/2015  HPI/Subjective: Patient states abdominal pain with no significant improvement since yesterday. Patient states her bladder is burning. Patient complaining of left flank pain. Patient noted to have a fever last night with nausea and emesis after oral intake of food. Patient states she just does not feel well.  Objective: Filed Vitals:   08/05/15 1000 08/05/15 1151  BP:  106/59  Pulse:  78  Temp:    Resp: 26 21    Intake/Output Summary (Last 24 hours) at 08/05/15 1208 Last data filed at 08/05/15 1110  Gross per 24 hour  Intake    760 ml  Output   4195 ml  Net  -3435 ml   Filed Weights   08/02/15 0400 08/04/15 0304 08/05/15 0500  Weight: 103.5 kg (228 lb 2.8 oz) 105.2 kg (231 lb 14.8 oz) 98.3 kg (216 lb 11.4 oz)    Exam:   General:  NAD  Cardiovascular: RRR  Respiratory: Bibasilar crackles  Abdomen: Soft, less diffuse tenderness to palpation right side greater than left, + bowel sounds, mild distention. JP drain with serosanginous fluid.  Musculoskeletal: No clubbing cyanosis  or edema.   Data Reviewed: Basic Metabolic Panel:  Recent Labs Lab 07/30/15 1817 07/31/15 [id number]  08/01/15 [id number] 08/02/15 0430 08/03/15 0425 08/04/15 [id number] 08/05/15 0506  08/05/15 0511  NA  --  136  --  138 140 137 139 135  --   K  --  3.3*  < > 3.8 3.2* 4.4 4.1 3.0*  --   CL  --  106  --  110 104 103 104 93*  --   CO2  --  22  --  18* 26 25 26 27   --   GLUCOSE  --  96  --  68 89 82 91 77  --   BUN  --  16  --  13 11 8 6 6   --   CREATININE  --  0.72  --  0.68 0.70 0.60 0.50 0.80  --   CALCIUM  --  7.6*  --  7.9* 8.1* 7.7* 8.2* 8.1*  --   MG 1.7 2.5*  --   --  1.7 2.0  --  1.8  --   PHOS  --   --   --   --   --   --   --   --  4.4  < > = values in this interval not displayed. Liver Function Tests:  Recent Labs Lab 07/30/15 1251 07/31/15 0428 08/01/15 0509 08/03/15 0425 08/05/15 0506  AST 23 16 13* 11* 11*  ALT 13* 11* 9* 8* 10*  ALKPHOS 60 48 50 52 48  BILITOT 3.4* 2.4* 2.9* 2.8* 2.6*  PROT 7.5 5.6* 5.5* 5.4* 6.2*  ALBUMIN 3.9 2.7* 2.4* 2.3* 2.5*    Recent Labs Lab 07/30/15 1251  LIPASE 18   No results for input(s): AMMONIA in the last 168 hours. CBC:  Recent Labs Lab 08/01/15 0509 08/01/15 2050 08/02/15 0430 08/03/15 0425 08/04/15 0508 08/05/15 0506  WBC 9.1 10.4 10.0 8.7 9.6 11.4*  NEUTROABS 8.0* 8.9*  --   --  7.3 9.0*  HGB 9.7* 9.8* 9.9* 9.9* 10.0* 10.1*  HCT 29.9* 29.8* 30.1* 29.8* 30.0* 30.9*  MCV 86.4 85.1 85.3 86.1 86.5 84.4  PLT 196 225 230 262 301 356   Cardiac Enzymes: No results for input(s): CKTOTAL, CKMB, CKMBINDEX, TROPONINI in the last 168 hours. BNP (last 3 results) No results for input(s): BNP in the last 8760 hours.  ProBNP (last 3 results) No results for input(s): PROBNP in the last 8760 hours.  CBG: No results for input(s): GLUCAP in the last 168 hours.  Recent Results (from the past 240 hour(s))  Blood culture (routine x 2)     Status: None   Collection Time: 07/30/15  6:31 PM  Result Value Ref Range Status   Specimen Description BLOOD RIGHT PORT  Final   Special Requests BOTTLES DRAWN AEROBIC AND ANAEROBIC 5CC EACH  Final   Culture   Final    NO GROWTH 5 DAYS Performed at [facility]    Report Status 08/04/2015 FINAL  Final  Blood culture (routine x 2)     Status: None   Collection Time: 07/30/15  6:33 PM  Result Value Ref Range Status   Specimen Description BLOOD LEFT WRIST  Final   Special Requests BOTTLES DRAWN AEROBIC AND ANAEROBIC 5CC EACH  Final   Culture   Final    NO GROWTH 5 DAYS Performed at [facility]    Report Status 08/04/2015 FINAL  Final  Culture, Urine     Status: None  Collection Time: 07/30/15  7:36 PM  Result Value Ref Range Status   Specimen Description URINE, RANDOM  Final   Special Requests NONE  Final   Culture   Final    3,000 COLONIES/mL INSIGNIFICANT GROWTH Performed at [facility]    Report Status 07/31/2015 FINAL  Final  MRSA PCR Screening     Status: None   Collection Time: 07/30/15  8:11 PM  Result Value Ref Range Status   MRSA by PCR NEGATIVE NEGATIVE Final    Comment:        The GeneXpert MRSA Assay (FDA approved for NASAL specimens only), is one component of a comprehensive MRSA colonization surveillance program. It is not intended to diagnose MRSA infection nor to guide or monitor treatment for MRSA infections.   Culture, routine-abscess     Status: None   Collection Time: 07/31/15  3:51 PM  Result Value Ref Range Status   Specimen Description ABSCESS RIGHT LOWER ABDOMINAL QUADRANT  Final   Special Requests Normal  Final   Gram Stain   Final    FEW WBC PRESENT,BOTH PMN AND MONONUCLEAR NO SQUAMOUS EPITHELIAL CELLS SEEN FEW GRAM NEGATIVE RODS Performed at [facility]    Culture   Final    MODERATE ESCHERICHIA COLI Performed at [facility]    Report Status 08/03/2015 FINAL  Final   Organism ID, Bacteria ESCHERICHIA COLI  Final      Susceptibility   Escherichia coli - MIC*    AMPICILLIN <=2 SENSITIVE Sensitive     AMPICILLIN/SULBACTAM <=2 SENSITIVE Sensitive     CEFEPIME <=1 SENSITIVE Sensitive     CEFTAZIDIME <=1 SENSITIVE Sensitive     CEFTRIAXONE <=1  SENSITIVE Sensitive     CIPROFLOXACIN <=0.25 SENSITIVE Sensitive     GENTAMICIN <=1 SENSITIVE Sensitive     IMIPENEM <=0.25 SENSITIVE Sensitive     PIP/TAZO <=4 SENSITIVE Sensitive     TOBRAMYCIN <=1 SENSITIVE Sensitive     TRIMETH/SULFA Value in next row Sensitive      <=20 SENSITIVE(NOTE)    * MODERATE ESCHERICHIA COLI  Anaerobic culture     Status: None (Preliminary result)   Collection Time: 07/31/15  3:51 PM  Result Value Ref Range Status   Specimen Description ABSCESS RIGHT LOWER ABDOMINAL QUADRANT  Final   Special Requests Normal  Final   Gram Stain PENDING  Incomplete   Culture   Final    NO ANAEROBES ISOLATED; CULTURE IN PROGRESS FOR 5 DAYS Performed at [facility]    Report Status PENDING  Incomplete  Body fluid culture     Status: None (Preliminary result)   Collection Time: 08/04/15  6:30 PM  Result Value Ref Range Status   Specimen Description ABDOMEN PERFORATED BOWEL  Final   Special Requests NONE  Final   Gram Stain   Final    RARE WBC PRESENT, PREDOMINANTLY MONONUCLEAR GRAM NEGATIVE RODS GRAM POSITIVE COCCI IN CHAINS IN PAIRS    Culture   Final    TOO YOUNG TO READ Performed at [facility]    Report Status PENDING  Incomplete     Studies: Ct Abdomen Pelvis W Contrast  08/04/2015  CLINICAL DATA:  "Has felt much worse since last pm "worse than I ever felt with the cancer". Drowsy with IV morphine but pain in abdomen not resolved with 1 mg dose last 0500. Pain is diffuse in abdomen and now particularly left lateral upper abdomen, which was "hot and red" last pm; not  as tender in mid abdomen as on admission. Increased drainage which appears fecal from RLQ drain. Passed small flatus this AM, no BM. Nausea intermittently including now, vomited x 1 last pm. Febrile now, no shaking chills. Hands puffy. Reports bladder pressure and dysuria. Has been getting up to [facility]. Rash thigh and lower abdomen possibly from toradol, resolved. Coughs with tries to  breathe deeply" Hx endometrial sarcoma  Dx'd 07/2014 EXAM: CT ABDOMEN AND PELVIS WITH CONTRAST TECHNIQUE: Multidetector CT imaging of the abdomen and pelvis was performed using the standard protocol following bolus administration of intravenous contrast. CONTRAST:  122mL OMNIPAQUE IOHEXOL 300 MG/ML  SOLN COMPARISON:  07/30/2015 FINDINGS: Lung bases: Minimal pleural effusions, greater on the left. Lung base atelectasis. No convincing pneumonia or edema. Effusions are new since the prior study. Atelectasis is similar. Liver, spleen, gallbladder, pancreas, adrenal glands:  Unremarkable. Kidneys, ureters, bladder: No renal masses. Symmetric renal enhancement and excretion. Mild bilateral renal collecting system dilation. Ureters are normal in course and in caliber. Bladder is unremarkable. Uterus:  Surgically absent. Gastrointestinal: The right lower quadrant collection has been partly decompressed with the pigtail catheter. It measures 3.2 cm in thickness where it had measured 4.5 cm. There is still dependent fluid and nondependent air. The portion of the collection that its more medial and inferior, containing the pigtail portion of the catheter, measures approximately 4.8 x 5.1 cm, previously approximately 3.7 x 3.2 cm. Posterior to this there is extraluminal fluid that contains contrast. It extends from the right lower quadrant adjacent to small bowel loops to the posterior pelvic recess. It measures 7.6 x 4.7 cm transversely, previously 6.6 x 3.5 cm. There is no residual free intraperitoneal air. There are no new collections. Inflammatory haziness in straining lies within the fat of the right lower quadrant and pelvis. Colon is mostly decompressed. There are diverticula mostly along the left colon without evidence of diverticulitis. A normal appendix is visualized. There is mild wall thickening of loops of small bowel adjacent to the right lower quadrant collections. Remainder of the small bowel is normal in caliber  with no wall thickening. Stomach shows chronic changes from previous gastric surgery, stable. Lymph nodes: There are enlarged heterogeneous retroperitoneal lymph nodes. An aortocaval node just above the bifurcation of the aorta measures 3.7 x 3.3 cm, without change from the prior study. A left common iliac chain node measures 2.2 cm, also without significant change. There are additional enlarged nodes extending inferior from this, stable. A right external iliac chain node measures 17 mm in short axis, also unchanged. Musculoskeletal: No osteoblastic or osteolytic lesions. Stable degenerative changes of the visualized spine. IMPRESSION: 1. Since the prior CT, percutaneous drainage of the right lower quadrant collection has been performed. Although the lateral portion of this collection has decreased in size, the more medial and inferior portion is larger. In addition, the collection extending from the right lower quadrant into the posterior pelvic recess has increased in size. It contains contrast reflecting continued communication with bowel. There are no new collections and there is no residual free air. Persistent inflammatory change surrounds the collections in the right lower quadrant small bowel. 2. Minimal pleural effusions, new since prior study. Lung base atelectasis is similar to the prior exam. 3. No other changes.  Stable metastatic adenopathy. Electronically Signed   By: Lajean Manes M.D.   On: 08/04/2015 16:34    Scheduled Meds: . anidulafungin  100 mg Intravenous Q24H  . antiseptic oral rinse  7 mL Mouth Rinse  BID  . [START ON 08/06/2015] insulin aspart  0-15 Units Subcutaneous 4 times per day  . nystatin   Topical BID  . piperacillin-tazobactam (ZOSYN)  IV  3.375 g Intravenous 3 times per day  . potassium chloride  10 mEq Intravenous Q1 Hr x 4  . sodium chloride flush  3 mL Intravenous Q12H   Continuous Infusions: . Marland KitchenTPN (CLINIMIX-E) Adult     And  . 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 If 7PM-7AM, please contact night-coverage at [email or web address], password [facility] 08/05/2015, 12:08 PM  LOS: 6 days

## 2015-08-05 NOTE — Progress Notes (Signed)
PT Cancellation Note  Patient Details Name: Erin Santiago MRN: [id number] DOB: 1956-01-01   Cancelled Treatment:    Reason Eval/Treat Not Completed: Medical issues which prohibited therapy, notes reviewed, worsening pain and medical condition . [name] 08/05/2015, 7:04 AM Tresa Endo PT [phone]

## 2015-08-05 NOTE — Progress Notes (Deleted)
CRITICAL VALUE ALERT  Critical value received:  Lactic acid 2.9  Date of notification:  08/05/15  Time of notification:  1613  Critical value read back:Yes.    Nurse who received alert:  Katherine Mantle RN  MD notified (1st page):  Dr Doyle Askew  Time of first page:  1614  MD notified (2nd page):  Time of second page:  Responding MD:  Dr Doyle Askew  Time MD responded:  [phone]

## 2015-08-05 NOTE — Progress Notes (Signed)
Physical Therapy Treatment Patient Details Name: Erin Santiago MRN: [id number] DOB: 06-20-1955 Today's Date: 08/05/2015    History of Present Illness 60 yo female admitted 2/23 with abdominal pain, h/o endometrial stromal sarcome, underwent surgery in February. S/P placement  of drain for abcess.    PT Comments    The patient is feeling better this AM. Ambulated x 200', reports R knee improved but still requires RW for safety  Follow Up Recommendations  Home health PT;Supervision - Intermittent     Equipment Recommendations  Rolling walker with 5" wheels ($ wheeled may benefit, patient goes outside  to care for sheep.)    Recommendations for Other Services       Precautions / Restrictions Precautions Precautions: Fall Precaution Comments: R knee twisted PTA. R drain in abd. Restrictions Weight Bearing Restrictions: No    Mobility  Bed Mobility Overal bed mobility: Independent             General bed mobility comments: oob  Transfers Overall transfer level: Needs assistance Equipment used: Rolling walker (2 wheeled) Transfers: Sit to/from Stand Sit to Stand: Supervision         General transfer comment: assistance for lines  Ambulation/Gait Ambulation/Gait assistance: Min guard Ambulation Distance (Feet): 220 Feet Assistive device: Rolling walker (2 wheeled) Gait Pattern/deviations: Step-through pattern     General Gait Details: stopped several times  to stretch and rest standing   Stairs            Wheelchair Mobility    Modified Rankin (Stroke Patients Only)       Balance                                    Cognition Arousal/Alertness: Awake/alert Behavior During Therapy: WFL for tasks assessed/performed   Area of Impairment: Attention   Current Attention Level: Selective           General Comments: had difficulty holding thoughts at times, may be medication/fatique, had difficulty naming objects when asking for  gown, sheet.    Exercises      General Comments        Pertinent Vitals/Pain Pain Assessment: No/denies pain    Home Living Family/patient expects to be discharged to:: Private residence Living Arrangements: Alone Available Help at Discharge: Friend(s);Available PRN/intermittently           Additional Comments: pt states she can have as much assistance as needed. She has sheep on farm. She has life alert for inside and outdoors    Prior Function Level of Independence: Independent          PT Goals (current goals can now be found in the care plan section) Acute Rehab PT Goals Patient Stated Goal: to take care of my sheep. Progress towards PT goals: Progressing toward goals    Frequency  Min 3X/week    PT Plan Current plan remains appropriate    Co-evaluation             End of Session Equipment Utilized During Treatment: Gait belt Activity Tolerance: Patient tolerated treatment well Patient left: in chair;with call bell/phone within reach     Time: 0910-0925 PT Time Calculation (min) (ACUTE ONLY): 15 min  Charges:  $Gait Training: 8-22 mins                    G Codes:      Marcelino Freestone PT  [id number]  08/05/2015, 11:11 AM

## 2015-08-06 ENCOUNTER — Other Ambulatory Visit: Payer: Federal, State, Local not specified - PPO

## 2015-08-06 ENCOUNTER — Ambulatory Visit: Payer: Federal, State, Local not specified - PPO

## 2015-08-06 LAB — COMPREHENSIVE METABOLIC PANEL
ALBUMIN: 2.5 g/dL — AB (ref 3.5–5.0)
ALK PHOS: 50 U/L (ref 38–126)
ALT: 10 U/L — ABNORMAL LOW (ref 14–54)
ANION GAP: 8 (ref 5–15)
AST: 11 U/L — ABNORMAL LOW (ref 15–41)
BUN: 8 mg/dL (ref 6–20)
CALCIUM: 8.3 mg/dL — AB (ref 8.9–10.3)
CHLORIDE: 98 mmol/L — AB (ref 101–111)
CO2: 31 mmol/L (ref 22–32)
Creatinine, Ser: 0.52 mg/dL (ref 0.44–1.00)
GFR calc non Af Amer: >60 mL/min (ref >60)
GLUCOSE: 127 mg/dL — AB (ref 65–99)
POTASSIUM: 3.5 mmol/L (ref 3.5–5.1)
SODIUM: 137 mmol/L (ref 135–145)
Total Bilirubin: 1.4 mg/dL — ABNORMAL HIGH (ref 0.3–1.2)
Total Protein: 6.1 g/dL — ABNORMAL LOW (ref 6.5–8.1)

## 2015-08-06 LAB — DIFFERENTIAL
BASOS PCT: 0 %
Basophils Absolute: 0 10*3/uL (ref 0.0–0.1)
EOS ABS: 0.6 10*3/uL (ref 0.0–0.7)
EOS PCT: 5 %
Lymphocytes Relative: 4 %
Lymphs Abs: 0.5 10*3/uL — ABNORMAL LOW (ref 0.7–4.0)
MONO ABS: 0.9 10*3/uL (ref 0.1–1.0)
Monocytes Relative: 9 %
NEUTROS PCT: 82 %
Neutro Abs: 8.7 10*3/uL — ABNORMAL HIGH (ref 1.7–7.7)

## 2015-08-06 LAB — CBC
HCT: 31.3 % — ABNORMAL LOW (ref 36.0–46.0)
Hemoglobin: 10.2 g/dL — ABNORMAL LOW (ref 12.0–15.0)
MCH: 27.7 pg (ref 26.0–34.0)
MCHC: 32.6 g/dL (ref 30.0–36.0)
MCV: 85.1 fL (ref 78.0–100.0)
PLATELETS: 400 10*3/uL (ref 150–400)
RBC: 3.68 MIL/uL — AB (ref 3.87–5.11)
RDW: 14.7 % (ref 11.5–15.5)
WBC: 10.7 10*3/uL — ABNORMAL HIGH (ref 4.0–10.5)

## 2015-08-06 LAB — PHOSPHORUS: PHOSPHORUS: 3.1 mg/dL (ref 2.5–4.6)

## 2015-08-06 LAB — GLUCOSE, CAPILLARY
GLUCOSE-CAPILLARY: 104 mg/dL — AB (ref 65–99)
GLUCOSE-CAPILLARY: 110 mg/dL — AB (ref 65–99)
GLUCOSE-CAPILLARY: 113 mg/dL — AB (ref 65–99)
Glucose-Capillary: 124 mg/dL — ABNORMAL HIGH (ref 65–99)

## 2015-08-06 LAB — TRIGLYCERIDES: Triglycerides: 188 mg/dL — ABNORMAL HIGH (ref <150)

## 2015-08-06 LAB — PREALBUMIN: PREALBUMIN: 6.4 mg/dL — AB (ref 18–38)

## 2015-08-06 LAB — MAGNESIUM: Magnesium: 1.9 mg/dL (ref 1.7–2.4)

## 2015-08-06 MED ORDER — FLUCONAZOLE IN SODIUM CHLORIDE 100-0.9 MG/50ML-% IV SOLN: 100.0000 mg | INTRAVENOUS | Status: DC

## 2015-08-06 MED ORDER — FAT EMULSION 20 % IV EMUL
250.0000 mL | INTRAVENOUS | Status: AC
  Administered 2015-08-06: 250 mL via INTRAVENOUS
  Filled 2015-08-06: qty 250

## 2015-08-06 MED ORDER — TRACE MINERALS CR-CU-MN-SE-ZN 10-1000-500-60 MCG/ML IV SOLN
INTRAVENOUS | Status: AC
  Administered 2015-08-06: 17:00:00 via INTRAVENOUS
  Filled 2015-08-06: qty 1560

## 2015-08-06 NOTE — Progress Notes (Signed)
[facility] Progress Note     Subjective: Pt doing well.  No N/V.  Ambulating well OOB.  Pain much improved.  Drain only put out 16mL, but IR drained it and 1/3 of the gravity bag has filled up.  Patient feels less distended and extremity swelling improved.  No fever/chills.  WBC down.  Had diarrhea this am which she said was foul smelling.  Objective: Vital signs in last 24 hours: Temp:  [98.5 F (36.9 C)-99.2 F (37.3 C)] 98.7 F (37.1 C) (03/02 0800) Pulse Rate:  [78] 78 (03/01 1151) Resp:  [14-29] 14 (03/02 0900) BP: (106-158)/(59-77) 150/77 mmHg (03/02 0406) SpO2:  [92 %-100 %] 94 % (03/02 0900) Weight:  [97.2 kg (214 lb 4.6 oz)] 97.2 kg (214 lb 4.6 oz) (03/02 0500) Last BM Date: 08/06/15  Intake/Output from previous day: 03/01 0701 - 03/02 0700 In: 1484.2 [IV Piggyback:680; TPN:594.2] Out: [id number] [Urine:1450; Drains:115] Intake/Output this shift: Total I/O In: 81 [TPN:50] Out: -   PE: Gen:  Alert, NAD, pleasant Card:  RRR, no M/G/R heard Pulm:  CTA, no W/R/R Abd: Soft, less distended, NT, +BS, no HSM,drain with feculent foul smelling drainage (176mL/24hr) but 1/3rd of gravity bag filled with medium brown thin drainage Ext:  Extremity swelling improved  Lab Results:   Recent Labs  08/05/15 0506 08/06/15 0513  WBC 11.4* 10.7*  HGB 10.1* 10.2*  HCT 30.9* 31.3*  PLT 356 400   BMET  Recent Labs  08/05/15 0506 08/06/15 0513  NA 135 137  K 3.0* 3.5  CL 93* 98*  CO2 27 31  GLUCOSE 77 127*  BUN 6 8  CREATININE 0.80 0.52  CALCIUM 8.1* 8.3*   PT/INR No results for input(s): LABPROT, INR in the last 72 hours. CMP     Component Value Date/Time   NA 137 08/06/2015 0513   NA 140 07/17/2015 1058   K 3.5 08/06/2015 0513   K 4.1 07/17/2015 1058   CL 98* 08/06/2015 0513   CO2 31 08/06/2015 0513   CO2 23 07/17/2015 1058   GLUCOSE 127* 08/06/2015 0513   GLUCOSE 93 07/17/2015 1058   BUN 8 08/06/2015 0513   BUN 18.4 07/17/2015 1058    CREATININE 0.52 08/06/2015 0513   CREATININE 0.8 07/17/2015 1058   CALCIUM 8.3* 08/06/2015 0513   CALCIUM 9.2 07/17/2015 1058   PROT 6.1* 08/06/2015 0513   PROT 7.1 07/17/2015 1058   ALBUMIN 2.5* 08/06/2015 0513   ALBUMIN 3.5 07/17/2015 1058   AST 11* 08/06/2015 0513   AST 18 07/17/2015 1058   ALT 10* 08/06/2015 0513   ALT 12 07/17/2015 1058   ALKPHOS 50 08/06/2015 0513   ALKPHOS 87 07/17/2015 1058   BILITOT 1.4* 08/06/2015 0513   BILITOT 0.85 07/17/2015 1058   GFRNONAA >60 08/06/2015 0513   GFRAA >60 08/06/2015 0513   Lipase     Component Value Date/Time   LIPASE 18 07/30/2015 1251       Studies/Results: Ct Abdomen Pelvis Wo Contrast  08/05/2015  CLINICAL DATA:  Metastatic endometrial carcinoma. Right lower quadrant abscess and bowel perforation. Percutaneous drain catheter placed 07/31/2015. Follow-up scan showed residual deep right pelvic collection with fistula. Optimized drainage is requested. EXAM: CT ABDOMEN AND PELVIS WITHOUT CONTRAST TECHNIQUE: Multidetector CT imaging of the abdomen and pelvis was performed following the standard protocol without IV contrast. Upper abdomen is excluded. Additional scans were obtained after contrast injection through the drain catheter. COMPARISON:  08/04/2015 and previous FINDINGS: Hepatobiliary: Negative, incompletely visualized. Pancreas:  Negative, incompletely visualized. Spleen: Negative, incompletely visualized. Adrenals/Urinary Tract: Negative, incompletely visualized. Adrenal glands not included. Urinary bladder nondistended. Stomach/Bowel: No evidence of obstruction, inflammatory process. Scattered sigmoid diverticula. Vascular/Lymphatic: 18 mm left para-aortic adenopathy, previously 22 mm. 31 mm aortocaval mass, previously 3.3 cm. 17 mm right external iliac adenopathy, stable. Reproductive: Previous hysterectomy. Other: Persistent Anterior right pelvic fluid collection with gas and fluid, 43 x 48 mm (previously 5.1 x 4.8 cm), with drain  catheter in its dependent aspect. There has been significant decompression of the deep right pelvic fluid collection seen previously containing oral contrast material. The largest evident component measures approximately 4.8 x1.3 cm (previously 7.6 x 4.7). After injection of dilute contrast through the drain catheter, there is opacification of this collection which has clearly decreased in size since the previous day's exam indicating decompression by the drain catheter. No new or undrained fluid collections. Musculoskeletal: No suspicious bone lesions identified. Facet DJD in the lower lumbar spine. IMPRESSION: 1. The lower right pelvic fluid collection has significantly decompressed through the drain catheter since the previous day's scan. This could have been due to suction bulb failure due to high output. Therefore, additional drain catheter was not placed, and bulb was changed to a gravity back to allow continuous drainage. 2. No new or undrained fluid collections. 3. Stable pelvic and retroperitoneal adenopathy. Electronically Signed   By: Lucrezia Europe M.D.   On: 08/05/2015 14:29   Ct Abdomen Pelvis W Contrast  08/04/2015  CLINICAL DATA:  "Has felt much worse since last pm "worse than I ever felt with the cancer". Drowsy with IV morphine but pain in abdomen not resolved with 1 mg dose last 0500. Pain is diffuse in abdomen and now particularly left lateral upper abdomen, which was "hot and red" last pm; not as tender in mid abdomen as on admission. Increased drainage which appears fecal from RLQ drain. Passed small flatus this AM, no BM. Nausea intermittently including now, vomited x 1 last pm. Febrile now, no shaking chills. Hands puffy. Reports bladder pressure and dysuria. Has been getting up to [facility]. Rash thigh and lower abdomen possibly from toradol, resolved. Coughs with tries to breathe deeply" Hx endometrial sarcoma  Dx'd 07/2014 EXAM: CT ABDOMEN AND PELVIS WITH CONTRAST TECHNIQUE: Multidetector CT  imaging of the abdomen and pelvis was performed using the standard protocol following bolus administration of intravenous contrast. CONTRAST:  155mL OMNIPAQUE IOHEXOL 300 MG/ML  SOLN COMPARISON:  07/30/2015 FINDINGS: Lung bases: Minimal pleural effusions, greater on the left. Lung base atelectasis. No convincing pneumonia or edema. Effusions are new since the prior study. Atelectasis is similar. Liver, spleen, gallbladder, pancreas, adrenal glands:  Unremarkable. Kidneys, ureters, bladder: No renal masses. Symmetric renal enhancement and excretion. Mild bilateral renal collecting system dilation. Ureters are normal in course and in caliber. Bladder is unremarkable. Uterus:  Surgically absent. Gastrointestinal: The right lower quadrant collection has been partly decompressed with the pigtail catheter. It measures 3.2 cm in thickness where it had measured 4.5 cm. There is still dependent fluid and nondependent air. The portion of the collection that its more medial and inferior, containing the pigtail portion of the catheter, measures approximately 4.8 x 5.1 cm, previously approximately 3.7 x 3.2 cm. Posterior to this there is extraluminal fluid that contains contrast. It extends from the right lower quadrant adjacent to small bowel loops to the posterior pelvic recess. It measures 7.6 x 4.7 cm transversely, previously 6.6 x 3.5 cm. There is no residual free intraperitoneal air. There are  no new collections. Inflammatory haziness in straining lies within the fat of the right lower quadrant and pelvis. Colon is mostly decompressed. There are diverticula mostly along the left colon without evidence of diverticulitis. A normal appendix is visualized. There is mild wall thickening of loops of small bowel adjacent to the right lower quadrant collections. Remainder of the small bowel is normal in caliber with no wall thickening. Stomach shows chronic changes from previous gastric surgery, stable. Lymph nodes: There are  enlarged heterogeneous retroperitoneal lymph nodes. An aortocaval node just above the bifurcation of the aorta measures 3.7 x 3.3 cm, without change from the prior study. A left common iliac chain node measures 2.2 cm, also without significant change. There are additional enlarged nodes extending inferior from this, stable. A right external iliac chain node measures 17 mm in short axis, also unchanged. Musculoskeletal: No osteoblastic or osteolytic lesions. Stable degenerative changes of the visualized spine. IMPRESSION: 1. Since the prior CT, percutaneous drainage of the right lower quadrant collection has been performed. Although the lateral portion of this collection has decreased in size, the more medial and inferior portion is larger. In addition, the collection extending from the right lower quadrant into the posterior pelvic recess has increased in size. It contains contrast reflecting continued communication with bowel. There are no new collections and there is no residual free air. Persistent inflammatory change surrounds the collections in the right lower quadrant small bowel. 2. Minimal pleural effusions, new since prior study. Lung base atelectasis is similar to the prior exam. 3. No other changes.  Stable metastatic adenopathy. Electronically Signed   By: Lajean Manes M.D.   On: 08/04/2015 16:34    Anti-infectives: Anti-infectives    Start     Dose/Rate Route Frequency Ordered Stop   08/06/15 0830  fluconazole (DIFLUCAN) IVPB 100 mg  Status:  Discontinued     100 mg 50 mL/hr over 60 Minutes Intravenous Every 24 hours 08/06/15 0819 08/06/15 0846   08/05/15 1600  anidulafungin (ERAXIS) 100 mg in sodium chloride 0.9 % 100 mL IVPB     100 mg over 90 Minutes Intravenous Every 24 hours 08/04/15 1509 08/08/15 1559   08/04/15 1600  anidulafungin (ERAXIS) 200 mg in sodium chloride 0.9 % 200 mL IVPB    Comments:  Pharmacy may adjust dosing strength, schedule, rate of infusion, etc as needed to  optimize therapy   200 mg over 180 Minutes Intravenous  Once 08/04/15 1509 08/04/15 1928   07/30/15 1645  piperacillin-tazobactam (ZOSYN) IVPB 3.375 g     3.375 g 12.5 mL/hr over 240 Minutes Intravenous 3 times per day 07/30/15 1634     07/30/15 1545  piperacillin-tazobactam (ZOSYN) IVPB 3.375 g     3.375 g 100 mL/hr over 30 Minutes Intravenous  Once 07/30/15 1541 07/30/15 1757       Assessment/Plan 60 year old female with a 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 Complex 9x4cm fluid collection -Mild elevation in WBC, no more fevers, drained 125mL/24hr out of IR drain (but IR just flushed it and a lot has come out, pain continues to improve -IR repeated CT on 3/1 and found collection had improved so an additional drain was not attempted -Labs in AM -NPO/TPN -No current plans for operation, no additional free air, patient is stable, less pain -  Ambulate and IS -SCD's and lovenox or heparin are okay from our perspective  FEN-NPO except sips/chips, TPN ID-Leukocytosis, Zosyn Day #8 for e. coli, pan sensitive, ? cipro at discharge, Eraxis for anti-fungal coverage Dispo-Repeat CT in about 7 days unless drainage were to go down significantly.  Will leave this up to IR.    Consider input with [facility] On[facility] Vivien Rossetti, MD as far as further Tx.    LOS: 7 days    Nat Christen 08/06/2015, 10:47 AM Pager: [phone]  (7am - 4:30pm M-F; 7am - 11:30am Sa/Su)

## 2015-08-06 NOTE — Progress Notes (Signed)
Date:  August 06, 2015 Chart reviewed for concurrent status and case management needs. Will continue to follow patient for changes and needs: Rhonda Davis, BSN, RN, CCM   [phone] 

## 2015-08-06 NOTE — Progress Notes (Signed)
MEDICAL ONCOLOGY August 06, 2015, 8:19 AM  Hospital day 8 Antibiotics: zosyn Antifungal anidulafungin Chemotherapy: (last May 2016) Planned adriamycin/ olaratumab held  Outpatient Physicians:Emma Rossi/ Andrew Au, , [name], [name] [facility]),Ajith Ashby Dawes (PCP [facility]), [name]; [name], [name]  Subjective: Has felt better in last 24 hrs. Tolerating up to chair and to [facility]. Slept last pm. TNA begun. Less pain in abdomen. Has had 3 "diarrhea" stools small amounts. Denies increased SOB. Swelling hands better.  Follow up CT by IR 08-05-15 showed improvement compared with 08-04-15, indicating better drainage from present catheter.    Objective: Vital signs in last 24 hours: Blood pressure 150/77, pulse 78, temperature 98.5 F (36.9 C), temperature source Oral, resp. rate 28, height 5' (1.524 m), weight 214 lb 4.6 oz (97.2 kg), last menstrual period 02/04/2014, SpO2 98 %. Awake, alert, looks more comfortable. Not diaphoretic, respirations not labored, not flushed. PERRL, not icteric. Oral mucosa moist, tongue with probable thrush. No JVD at 45 degrees. Lungs clear anteriorly. Heart RRR. PAC site ok, infusing the TNA. Peripheral IV in RUE site ok. Abdomen soft, some BS, not tender, less distended. Drainage bag just emptied, dressing clean and dry, not tender RLQ in area of drain. LE no edema, cords. Moves all extremities easily.  Intake/Output from previous day: 03/01 0701 - 03/02 0700 In: 1484.2 [IV Piggyback:680; TPN:594.2] Out: [id number] [Urine:1450; Drains:115] Intake/Output this shift:      Lab Results:  Recent Labs  08/05/15 0506 08/06/15 0513  WBC 11.4* 10.7*  HGB 10.1* 10.2*  HCT 30.9* 31.3*  PLT 356 400   BMET  Recent Labs  08/05/15 0506 08/06/15 0513  NA 135 137  K 3.0* 3.5  CL 93* 98*  CO2 27 31  GLUCOSE 77 127*  BUN 6 8  CREATININE 0.80 0.52  CALCIUM 8.1* 8.3*    Studies/Results: Ct Abdomen  Pelvis Wo Contrast  08/05/2015  CLINICAL DATA:  Metastatic endometrial carcinoma. Right lower quadrant abscess and bowel perforation. Percutaneous drain catheter placed 07/31/2015. Follow-up scan showed residual deep right pelvic collection with fistula. Optimized drainage is requested. EXAM: CT ABDOMEN AND PELVIS WITHOUT CONTRAST TECHNIQUE: Multidetector CT imaging of the abdomen and pelvis was performed following the standard protocol without IV contrast. Upper abdomen is excluded. Additional scans were obtained after contrast injection through the drain catheter. COMPARISON:  08/04/2015 and previous FINDINGS: Hepatobiliary: Negative, incompletely visualized. Pancreas: Negative, incompletely visualized. Spleen: Negative, incompletely visualized. Adrenals/Urinary Tract: Negative, incompletely visualized. Adrenal glands not included. Urinary bladder nondistended. Stomach/Bowel: No evidence of obstruction, inflammatory process. Scattered sigmoid diverticula. Vascular/Lymphatic: 18 mm left para-aortic adenopathy, previously 22 mm. 31 mm aortocaval mass, previously 3.3 cm. 17 mm right external iliac adenopathy, stable. Reproductive: Previous hysterectomy. Other: Persistent Anterior right pelvic fluid collection with gas and fluid, 43 x 48 mm (previously 5.1 x 4.8 cm), with drain catheter in its dependent aspect. There has been significant decompression of the deep right pelvic fluid collection seen previously containing oral contrast material. The largest evident component measures approximately 4.8 x1.3 cm (previously 7.6 x 4.7). After injection of dilute contrast through the drain catheter, there is opacification of this collection which has clearly decreased in size since the previous day's exam indicating decompression by the drain catheter. No new or undrained fluid collections. Musculoskeletal: No suspicious bone lesions identified. Facet DJD in the lower lumbar spine. IMPRESSION: 1. The lower right pelvic fluid  collection has significantly decompressed through the drain catheter since the previous day's scan.  This could have been due to suction bulb failure due to high output. Therefore, additional drain catheter was not placed, and bulb was changed to a gravity back to allow continuous drainage. 2. No new or undrained fluid collections. 3. Stable pelvic and retroperitoneal adenopathy. Electronically Signed   By: Lucrezia Europe M.D.   On: 08/05/2015 14:29   Ct Abdomen Pelvis W Contrast  08/04/2015  CLINICAL DATA:  "Has felt much worse since last pm "worse than I ever felt with the cancer". Drowsy with IV morphine but pain in abdomen not resolved with 1 mg dose last 0500. Pain is diffuse in abdomen and now particularly left lateral upper abdomen, which was "hot and red" last pm; not as tender in mid abdomen as on admission. Increased drainage which appears fecal from RLQ drain. Passed small flatus this AM, no BM. Nausea intermittently including now, vomited x 1 last pm. Febrile now, no shaking chills. Hands puffy. Reports bladder pressure and dysuria. Has been getting up to [facility]. Rash thigh and lower abdomen possibly from toradol, resolved. Coughs with tries to breathe deeply" Hx endometrial sarcoma  Dx'd 07/2014 EXAM: CT ABDOMEN AND PELVIS WITH CONTRAST TECHNIQUE: Multidetector CT imaging of the abdomen and pelvis was performed using the standard protocol following bolus administration of intravenous contrast. CONTRAST:  159mL OMNIPAQUE IOHEXOL 300 MG/ML  SOLN COMPARISON:  07/30/2015 FINDINGS: Lung bases: Minimal pleural effusions, greater on the left. Lung base atelectasis. No convincing pneumonia or edema. Effusions are new since the prior study. Atelectasis is similar. Liver, spleen, gallbladder, pancreas, adrenal glands:  Unremarkable. Kidneys, ureters, bladder: No renal masses. Symmetric renal enhancement and excretion. Mild bilateral renal collecting system dilation. Ureters are normal in course and in caliber. Bladder  is unremarkable. Uterus:  Surgically absent. Gastrointestinal: The right lower quadrant collection has been partly decompressed with the pigtail catheter. It measures 3.2 cm in thickness where it had measured 4.5 cm. There is still dependent fluid and nondependent air. The portion of the collection that its more medial and inferior, containing the pigtail portion of the catheter, measures approximately 4.8 x 5.1 cm, previously approximately 3.7 x 3.2 cm. Posterior to this there is extraluminal fluid that contains contrast. It extends from the right lower quadrant adjacent to small bowel loops to the posterior pelvic recess. It measures 7.6 x 4.7 cm transversely, previously 6.6 x 3.5 cm. There is no residual free intraperitoneal air. There are no new collections. Inflammatory haziness in straining lies within the fat of the right lower quadrant and pelvis. Colon is mostly decompressed. There are diverticula mostly along the left colon without evidence of diverticulitis. A normal appendix is visualized. There is mild wall thickening of loops of small bowel adjacent to the right lower quadrant collections. Remainder of the small bowel is normal in caliber with no wall thickening. Stomach shows chronic changes from previous gastric surgery, stable. Lymph nodes: There are enlarged heterogeneous retroperitoneal lymph nodes. An aortocaval node just above the bifurcation of the aorta measures 3.7 x 3.3 cm, without change from the prior study. A left common iliac chain node measures 2.2 cm, also without significant change. There are additional enlarged nodes extending inferior from this, stable. A right external iliac chain node measures 17 mm in short axis, also unchanged. Musculoskeletal: No osteoblastic or osteolytic lesions. Stable degenerative changes of the visualized spine. IMPRESSION: 1. Since the prior CT, percutaneous drainage of the right lower quadrant collection has been performed. Although the lateral portion  of this collection has decreased  in size, the more medial and inferior portion is larger. In addition, the collection extending from the right lower quadrant into the posterior pelvic recess has increased in size. It contains contrast reflecting continued communication with bowel. There are no new collections and there is no residual free air. Persistent inflammatory change surrounds the collections in the right lower quadrant small bowel. 2. Minimal pleural effusions, new since prior study. Lung base atelectasis is similar to the prior exam. 3. No other changes.  Stable metastatic adenopathy. Electronically Signed   By: Lajean Manes M.D.   On: 08/04/2015 16:34     Assessment/Plan: 1. Bwel perforation/ possible tumor invasion into bowel in setting of progressive endometrial stromal sarcoma:IR drain seems to be improving now 2.Endometrial stromal sarcoma:  surgery [facility] 07-14-14, progression by start of cycle 4 adjuvant gemzar taxotere in 10-2014. Radiation to symptomatic aortocaval and bilateral iliac adenopathy. Further progression recently including T4, post radiation there. Not eligible for any of the arms of MATCH trial. Plan had been to start adriamycin + olaratumab just at time of this complication, held. Long term prognosis from the metastatic endometrial sarcoma is not good, however disease has not been rapidly progressive and patient has been very functional.  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 Hx nonmelanoma skin cancer. 7.clinically with oral candida: discussed with pharmacist  Please page me if needed between my rounds   [place]

## 2015-08-06 NOTE — Progress Notes (Signed)
[facility] PROGRESS NOTE  Erin Santiago [id number] DOB: Jul 17, 1955 DOA: 07/30/2015 PCP: Merrilee Seashore, MD  Brief interval history  Aretha Dornak is a 60 y.o. 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 Patient is status post chemotherapy and radiation to the pelvis and back and was due to start chemotherapy on the day of admission due to progression of disease involving the left abdominal pain. Patient had presented to the ED with complaints of 1-2 day history of diffuse abdominal pain. Patient stated that she woke up around 2 AM one day prior to admission with significant diffuse abdominal pain and when she stood up to go through her bathroom she lost control of her bladder due to probable incontinence. Patient stated that the whole day prior to admission was lethargic and very sleepy and slept all day. Patient does endorse subjective fevers, chills, nausea, emesis, generalized weakness, diarrhea. Patient also endorses some hematochezia. Patient denies chest pain, no constipation, no dysuria, no hematemesis, no melena. Patient does endorse an episode of hematemesis. Patient denies any cough. Patient was seen in the emergency room comprehensive metabolic profile done at a sodium of 133 bicarbonate of 19 BUN of 29 creatinine of 1.06 glucose of 125 AST of 13 ALT of 23 protein of 7.5 bilirubin of 3.4 otherwise is within normal limits. Initial lactic acid level is elevated at 2.67 and repeat lactic acid level was down to 0.93. CBC had a white count of 11.1 with no other abnormalities. Urinalysis pending. Acute abdominal series showed dilated small bowel loops containing residual contrast in the midabdomen most likely represent a component of small bowel obstruction. Bibasilar atelectasis. CT abdomen and pelvis did show bowel perforation.  Large 9 x 4.5 cm complex right lower quadrant and pelvic abscess containing a small amount of contrast suggesting communication with the distal ileum. This could be due to tumor erosion. Stable adenopathy in the abdomen and pelvis. Patient was seen in consultation by general surgery who recommended initially nonoperative percutaneous drainage and admission per hospitalist.  Patient was admitted to the stepdown unit general surgery was consulted patient was placed on bowel rest and monitored. Interventional radiology was also consulted for drain placement. Patient had a percutaneous drain placed on 07/31/2015 and was placed empirically on IV Zosyn from admission. Wound/abscess cultures grew out Escherichia coli. Patient was monitored. Patient's pain was controlled on pain medication. Oncology also assess the patient. Patient was started on clear liquids and as diet was advanced patient was noted to have abdominal pain with a fever and emesis. Patient's diet has subsequently been changed back to clear liquids. Repeat CT abdomen and pelvis pending for 08/04/2015. Will keep an stepdown unit. If patient deteriorates will likely need emergency exploratory laparotomy.      Assessment/Plan: #1 bowel perforation, complex intra-abdominal fluid collection 9 x 4.5 cm right lower quadrant and pelvic Questionable etiology. Patient with fever overnight with emesis when she tried oral intake of food. Patient states no significant improvement with abdominal pain since yesterday 08/03/2015. Possible invasion of cancer/ metastatic endometrial cancer into bowel. Per CT abdomen and pelvis looks like perforation may have been walled off and contained. Blood cultures pending. Patient currently afebrile. Leukocytosis trending down. Continue empiric IV Zosyn. Patient has been seen in consultation by general surgery will recommending nonoperative percutaneous drainage placement. S/p RLQ percutaneous drain 07/31/2015 per IR.   Wound/abscess cultures growing Escherichia coli.  -  Repeat CT scan of abdomen and pelvis performed on 07/27/2015 show a decrease in the size of fluid collection in the lateral portion however more medially it's larger. Radios reported that the collection extending from the right lower quadrant into the posterior pelvic wheezes has also increased in size. This contains contrast. -Gen. surgery and interventional radiology following. -She has been afebrile for the past 24 hours -Blood cultures obtained on 07/30/2015 show no growth 2  -Remains on Zosyn 3.375 g IV 3 times a day as well as TPN. -Will await further recommendations from surgery  #2 history of iron deficiency anemia Hemoglobin dropped to 10.0 from 13.8 on admission, however has remained stable. Likely dilutional component as patient on IV fluids.  Hemoglobin currently at 10. NSL IVF.  Follow.  #3 dehydration/metabolic acidosis -Resolving. She was treated with IV fluids.   #4 hypokalemia -Potassium improved to 3.5 from 3.03 received IV potassium on 08/05/2015  #5 lactic acidosis Likely secondary to problem #1 and dehydration. Lactic acid levels trending down. Patient has been pancultured. Abscess growing [place] which is pan sensitive. Continue empiric IV Zosyn.  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 Patient status post chemotherapy and radiation to the pelvis. Patient was to start chemotherapy on the day of admission due to progression of disease involving the left, iliac adenopathy, 2 masses in the right lower quadrant seen on CT scan on 07/21/2015.  Oncology following.  #7 prophylaxis SCDs for DVT prophylaxis.   Code Status: Full Family Communication: Updated patient. No family at bedside. Disposition Plan: Remain in the step down unit.   Consultants:  Gen. surgery: Dr. Excell Seltzer  07/30/2015   oncology: Dr. Marko Plume 07/31/2015  Interventional radiology  Procedures:  CT abdomen and pelvis 07/30/2015  Acute abdominal series 07/30/2015  Right lower quadrant abdominal abscess drain with CT 07/31/2015 for Dr. Annamaria Boots  Antibiotics:  IV Zosyn 07/30/2015  HPI/Subjective: Patient states feeling about the same, reporting having episode of diarrhea overnight.  Objective: Filed Vitals:   08/06/15 0406 08/06/15 0800  BP: 150/77   Pulse:    Temp:  98.7 F (37.1 C)  Resp: 28     Intake/Output Summary (Last 24 hours) at 08/06/15 0845 Last data filed at 08/06/15 0600  Gross per 24 hour  Intake 1484.16 ml  Output   1565 ml  Net -80.84 ml   Filed Weights   08/04/15 0304 08/05/15 0500 08/06/15 0500  Weight: 105.2 kg (231 lb 14.8 oz) 98.3 kg (216 lb 11.4 oz) 97.2 kg (214 lb 4.6 oz)    Exam:   General:  NAD  Cardiovascular: RRR  Respiratory: Bibasilar crackles  Abdomen: Soft, less diffuse tenderness to palpation right side greater than left, + bowel sounds, mild distention. JP drain with serosanginous fluid.  Musculoskeletal: No clubbing cyanosis or edema.   Data Reviewed: Basic Metabolic Panel:  Recent Labs Lab 07/31/15 0428  08/02/15 0430 08/03/15 0425 08/04/15 [id number] 08/05/15 0506 08/05/15 0511 08/06/15 0513  NA 136  < > 140 137 139 135  --  137  K 3.3*  < > 3.2* 4.4 4.1 3.0*  --  3.5  CL 106  < > 104 103 104 93*  --  98*  CO2 22  < > 26 25 26 27   --  31  GLUCOSE 96  < > 89 82 91 77  --  127*  BUN 16  < > 11 8  6 6  --  8  CREATININE 0.72  < > 0.70 0.60 0.50 0.80  --  0.52  CALCIUM 7.6*  < > 8.1* 7.7* 8.2* 8.1*  --  8.3*  MG 2.5*  --  1.7 2.0  --  1.8  --  1.9  PHOS  --   --   --   --   --   --  4.4 3.1  < > = values in this interval not displayed. Liver Function Tests:  Recent Labs Lab 07/31/15 0428 08/01/15 0509 08/03/15 0425 08/05/15 0506 08/06/15 0513  AST 16 13* 11* 11* 11*  ALT 11* 9* 8* 10* 10*  ALKPHOS 48 50 52 48 50   BILITOT 2.4* 2.9* 2.8* 2.6* 1.4*  PROT 5.6* 5.5* 5.4* 6.2* 6.1*  ALBUMIN 2.7* 2.4* 2.3* 2.5* 2.5*    Recent Labs Lab 07/30/15 1251  LIPASE 18   No results for input(s): AMMONIA in the last 168 hours. CBC:  Recent Labs Lab 08/01/15 0509 08/01/15 2050 08/02/15 0430 08/03/15 0425 08/04/15 0508 08/05/15 0506 08/06/15 0513  WBC 9.1 10.4 10.0 8.7 9.6 11.4* 10.7*  NEUTROABS 8.0* 8.9*  --   --  7.3 9.0* 8.7*  HGB 9.7* 9.8* 9.9* 9.9* 10.0* 10.1* 10.2*  HCT 29.9* 29.8* 30.1* 29.8* 30.0* 30.9* 31.3*  MCV 86.4 85.1 85.3 86.1 86.5 84.4 85.1  PLT 196 225 230 262 301 356 400   Cardiac Enzymes: No results for input(s): CKTOTAL, CKMB, CKMBINDEX, TROPONINI in the last 168 hours. BNP (last 3 results) No results for input(s): BNP in the last 8760 hours.  ProBNP (last 3 results) No results for input(s): PROBNP in the last 8760 hours.  CBG:  Recent Labs Lab 08/05/15 1611 08/05/15 1754 08/05/15 2327 08/06/15 0523  GLUCAP 57* 104* 104* 110*    Recent Results (from the past 240 hour(s))  Blood culture (routine x 2)     Status: None   Collection Time: 07/30/15  6:31 PM  Result Value Ref Range Status   Specimen Description BLOOD RIGHT PORT  Final   Special Requests BOTTLES DRAWN AEROBIC AND ANAEROBIC 5CC EACH  Final   Culture   Final    NO GROWTH 5 DAYS Performed at [facility]    Report Status 08/04/2015 FINAL  Final  Blood culture (routine x 2)     Status: None   Collection Time: 07/30/15  6:33 PM  Result Value Ref Range Status   Specimen Description BLOOD LEFT WRIST  Final   Special Requests BOTTLES DRAWN AEROBIC AND ANAEROBIC 5CC EACH  Final   Culture   Final    NO GROWTH 5 DAYS Performed at [facility]    Report Status 08/04/2015 FINAL  Final  Culture, Urine     Status: None   Collection Time: 07/30/15  7:36 PM  Result Value Ref Range Status   Specimen Description URINE, RANDOM  Final   Special Requests NONE  Final   Culture   Final    3,000  COLONIES/mL INSIGNIFICANT GROWTH Performed at [facility]    Report Status 07/31/2015 FINAL  Final  MRSA PCR Screening     Status: None   Collection Time: 07/30/15  8:11 PM  Result Value Ref Range Status   MRSA by PCR NEGATIVE NEGATIVE Final    Comment:        The GeneXpert MRSA Assay (FDA approved for NASAL specimens only), is one component of a comprehensive MRSA colonization surveillance program. It is not  intended to diagnose MRSA infection nor to guide or monitor treatment for MRSA infections.   Culture, routine-abscess     Status: None   Collection Time: 07/31/15  3:51 PM  Result Value Ref Range Status   Specimen Description ABSCESS RIGHT LOWER ABDOMINAL QUADRANT  Final   Special Requests Normal  Final   Gram Stain   Final    FEW WBC PRESENT,BOTH PMN AND MONONUCLEAR NO SQUAMOUS EPITHELIAL CELLS SEEN FEW GRAM NEGATIVE RODS Performed at [facility]    Culture   Final    MODERATE ESCHERICHIA COLI Performed at [facility]    Report Status 08/03/2015 FINAL  Final   Organism ID, Bacteria ESCHERICHIA COLI  Final      Susceptibility   Escherichia coli - MIC*    AMPICILLIN <=2 SENSITIVE Sensitive     AMPICILLIN/SULBACTAM <=2 SENSITIVE Sensitive     CEFEPIME <=1 SENSITIVE Sensitive     CEFTAZIDIME <=1 SENSITIVE Sensitive     CEFTRIAXONE <=1 SENSITIVE Sensitive     CIPROFLOXACIN <=0.25 SENSITIVE Sensitive     GENTAMICIN <=1 SENSITIVE Sensitive     IMIPENEM <=0.25 SENSITIVE Sensitive     PIP/TAZO <=4 SENSITIVE Sensitive     TOBRAMYCIN <=1 SENSITIVE Sensitive     TRIMETH/SULFA Value in next row Sensitive      <=20 SENSITIVE(NOTE)    * MODERATE ESCHERICHIA COLI  Anaerobic culture     Status: None   Collection Time: 07/31/15  3:51 PM  Result Value Ref Range Status   Specimen Description ABSCESS RIGHT LOWER ABDOMINAL QUADRANT  Final   Special Requests Normal  Final   Gram Stain   Final    FEW WBC PRESENT,BOTH PMN AND MONONUCLEAR NO SQUAMOUS  EPITHELIAL CELLS SEEN FEW GRAM NEGATIVE RODS Performed at [facility]    Culture   Final    BACTEROIDES VULGATUS Note: BETA LACTAMASE POSITIVE Performed at [facility]    Report Status 08/05/2015 FINAL  Final  Culture, Urine     Status: None   Collection Time: 08/04/15 12:24 PM  Result Value Ref Range Status   Specimen Description URINE, RANDOM  Final   Special Requests NONE  Final   Culture   Final    NO GROWTH 1 DAY Performed at [facility]    Report Status 08/05/2015 FINAL  Final  Body fluid culture     Status: None (Preliminary result)   Collection Time: 08/04/15  6:30 PM  Result Value Ref Range Status   Specimen Description ABDOMEN PERFORATED BOWEL  Final   Special Requests NONE  Final   Gram Stain   Final    RARE WBC PRESENT, PREDOMINANTLY MONONUCLEAR GRAM NEGATIVE RODS GRAM POSITIVE COCCI IN CHAINS IN PAIRS GRAM POSITIVE RODS    Culture   Final    TOO YOUNG TO READ Performed at [facility]    Report Status PENDING  Incomplete     Studies: Ct Abdomen Pelvis Wo Contrast  08/05/2015  CLINICAL DATA:  Metastatic endometrial carcinoma. Right lower quadrant abscess and bowel perforation. Percutaneous drain catheter placed 07/31/2015. Follow-up scan showed residual deep right pelvic collection with fistula. Optimized drainage is requested. EXAM: CT ABDOMEN AND PELVIS WITHOUT CONTRAST TECHNIQUE: Multidetector CT imaging of the abdomen and pelvis was performed following the standard protocol without IV contrast. Upper abdomen is excluded. Additional scans were obtained after contrast injection through the drain catheter. COMPARISON:  08/04/2015 and previous FINDINGS: Hepatobiliary: Negative, incompletely visualized. Pancreas: Negative, incompletely visualized. Spleen:  Negative, incompletely visualized. Adrenals/Urinary Tract: Negative, incompletely visualized. Adrenal glands not included. Urinary bladder nondistended. Stomach/Bowel: No evidence of  obstruction, inflammatory process. Scattered sigmoid diverticula. Vascular/Lymphatic: 18 mm left para-aortic adenopathy, previously 22 mm. 31 mm aortocaval mass, previously 3.3 cm. 17 mm right external iliac adenopathy, stable. Reproductive: Previous hysterectomy. Other: Persistent Anterior right pelvic fluid collection with gas and fluid, 43 x 48 mm (previously 5.1 x 4.8 cm), with drain catheter in its dependent aspect. There has been significant decompression of the deep right pelvic fluid collection seen previously containing oral contrast material. The largest evident component measures approximately 4.8 x1.3 cm (previously 7.6 x 4.7). After injection of dilute contrast through the drain catheter, there is opacification of this collection which has clearly decreased in size since the previous day's exam indicating decompression by the drain catheter. No new or undrained fluid collections. Musculoskeletal: No suspicious bone lesions identified. Facet DJD in the lower lumbar spine. IMPRESSION: 1. The lower right pelvic fluid collection has significantly decompressed through the drain catheter since the previous day's scan. This could have been due to suction bulb failure due to high output. Therefore, additional drain catheter was not placed, and bulb was changed to a gravity back to allow continuous drainage. 2. No new or undrained fluid collections. 3. Stable pelvic and retroperitoneal adenopathy. Electronically Signed   By: Lucrezia Europe M.D.   On: 08/05/2015 14:29   Ct Abdomen Pelvis W Contrast  08/04/2015  CLINICAL DATA:  "Has felt much worse since last pm "worse than I ever felt with the cancer". Drowsy with IV morphine but pain in abdomen not resolved with 1 mg dose last 0500. Pain is diffuse in abdomen and now particularly left lateral upper abdomen, which was "hot and red" last pm; not as tender in mid abdomen as on admission. Increased drainage which appears fecal from RLQ drain. Passed small flatus this  AM, no BM. Nausea intermittently including now, vomited x 1 last pm. Febrile now, no shaking chills. Hands puffy. Reports bladder pressure and dysuria. Has been getting up to [facility]. Rash thigh and lower abdomen possibly from toradol, resolved. Coughs with tries to breathe deeply" Hx endometrial sarcoma  Dx'd 07/2014 EXAM: CT ABDOMEN AND PELVIS WITH CONTRAST TECHNIQUE: Multidetector CT imaging of the abdomen and pelvis was performed using the standard protocol following bolus administration of intravenous contrast. CONTRAST:  14mL OMNIPAQUE IOHEXOL 300 MG/ML  SOLN COMPARISON:  07/30/2015 FINDINGS: Lung bases: Minimal pleural effusions, greater on the left. Lung base atelectasis. No convincing pneumonia or edema. Effusions are new since the prior study. Atelectasis is similar. Liver, spleen, gallbladder, pancreas, adrenal glands:  Unremarkable. Kidneys, ureters, bladder: No renal masses. Symmetric renal enhancement and excretion. Mild bilateral renal collecting system dilation. Ureters are normal in course and in caliber. Bladder is unremarkable. Uterus:  Surgically absent. Gastrointestinal: The right lower quadrant collection has been partly decompressed with the pigtail catheter. It measures 3.2 cm in thickness where it had measured 4.5 cm. There is still dependent fluid and nondependent air. The portion of the collection that its more medial and inferior, containing the pigtail portion of the catheter, measures approximately 4.8 x 5.1 cm, previously approximately 3.7 x 3.2 cm. Posterior to this there is extraluminal fluid that contains contrast. It extends from the right lower quadrant adjacent to small bowel loops to the posterior pelvic recess. It measures 7.6 x 4.7 cm transversely, previously 6.6 x 3.5 cm. There is no residual free intraperitoneal air. There are no new collections. Inflammatory  haziness in straining lies within the fat of the right lower quadrant and pelvis. Colon is mostly decompressed. There are  diverticula mostly along the left colon without evidence of diverticulitis. A normal appendix is visualized. There is mild wall thickening of loops of small bowel adjacent to the right lower quadrant collections. Remainder of the small bowel is normal in caliber with no wall thickening. Stomach shows chronic changes from previous gastric surgery, stable. Lymph nodes: There are enlarged heterogeneous retroperitoneal lymph nodes. An aortocaval node just above the bifurcation of the aorta measures 3.7 x 3.3 cm, without change from the prior study. A left common iliac chain node measures 2.2 cm, also without significant change. There are additional enlarged nodes extending inferior from this, stable. A right external iliac chain node measures 17 mm in short axis, also unchanged. Musculoskeletal: No osteoblastic or osteolytic lesions. Stable degenerative changes of the visualized spine. IMPRESSION: 1. Since the prior CT, percutaneous drainage of the right lower quadrant collection has been performed. Although the lateral portion of this collection has decreased in size, the more medial and inferior portion is larger. In addition, the collection extending from the right lower quadrant into the posterior pelvic recess has increased in size. It contains contrast reflecting continued communication with bowel. There are no new collections and there is no residual free air. Persistent inflammatory change surrounds the collections in the right lower quadrant small bowel. 2. Minimal pleural effusions, new since prior study. Lung base atelectasis is similar to the prior exam. 3. No other changes.  Stable metastatic adenopathy. Electronically Signed   By: Lajean Manes M.D.   On: 08/04/2015 16:34    Scheduled Meds: . anidulafungin  100 mg Intravenous Q24H  . antiseptic oral rinse  7 mL Mouth Rinse BID  . fluconazole (DIFLUCAN) IV  100 mg Intravenous Q24H  . insulin aspart  0-15 Units Subcutaneous 4 times per day  .  nystatin   Topical BID  . piperacillin-tazobactam (ZOSYN)  IV  3.375 g Intravenous 3 times per day  . sodium chloride flush  3 mL Intravenous Q12H   Continuous Infusions: . Marland KitchenTPN (CLINIMIX-E) Adult 40 mL/hr at 08/06/15 0600   And  . 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 If 7PM-7AM, please contact night-coverage at [email or web address], password [facility] 08/06/2015, 8:45 AM  LOS: 7 days

## 2015-08-06 NOTE — Progress Notes (Signed)
Patient ID: Erin Santiago, female   DOB: Feb 02, 1956, 60 y.o.   MRN: [id number]    Referring Physician(s): CCS  Supervising Physician: Aletta Edouard  Chief Complaint: Right lower quadrant abscess   Subjective:  Pt doing ok today; a little drowsy; less abd pain; having some loose stools  Allergies: Review of patient's allergies indicates no known allergies.  Medications: Prior to Admission medications   Medication Sig Start Date End Date Taking? Authorizing Provider  calcium carbonate (TUMS - DOSED IN MG ELEMENTAL CALCIUM) 500 MG chewable tablet Chew 2 tablets by mouth daily.    Yes Historical Provider, MD  dexamethasone (DECADRON) 4 MG tablet Take 2 tablets by mouth once a day on the day after chemotherapy and then take 2 tablets two times a day for 2 days. Take with food. 07/27/15  Yes Lennis Marion Downer, MD  docusate sodium (COLACE) 100 MG capsule Take 100 mg by mouth 2 (two) times daily as needed for mild constipation. Reported on 05/28/2015 07/17/14  Yes Historical Provider, MD  hydrocortisone 2.5 % cream Apply 1 application topically as needed (itch/psoriasis.). Reported on 07/17/2015 05/19/14  Yes Historical Provider, MD  lidocaine-prilocaine (EMLA) cream Apply to Porta-cath 1-2 hrs prior to access. Cover with [employer]. 08/18/14  Yes Lennis Marion Downer, MD  LORazepam (ATIVAN) 1 MG tablet Place 1/2 to 1 tablet under the tongue or swallow every 6 hours as needed for nausea 07/22/15  Yes Lennis P Livesay, MD  meloxicam (MOBIC) 15 MG tablet Take 15 mg by mouth daily.  06/09/14  Yes Historical Provider, MD  ondansetron (ZOFRAN) 8 MG tablet Take 1 tablet (8 mg total) by mouth every 8 (eight) hours as needed for nausea or vomiting. 07/22/15  Yes Lennis Marion Downer, MD  prochlorperazine (COMPAZINE) 10 MG tablet Take 1 tablet (10 mg total) by mouth every 6 (six) hours as needed (Nausea or vomiting). 07/27/15  Yes Lennis Marion Downer, MD  Psyllium 28.3 % POWD Take 1 packet by mouth daily as needed (for  fiber). Reported on 05/26/2015   Yes Historical Provider, MD  Resveratrol 250 MG CAPS Take 250 mg by mouth 2 (two) times daily. Reported on 05/26/2015   Yes Historical Provider, MD  ferrous fumarate (HEMOCYTE) 325 (106 FE) MG TABS tablet Take 1 tab daily on an empty stomach with OJ or Vitamin C 500 mg Patient not taking: Reported on 07/09/2015 09/15/14   Lennis Marion Downer, MD  sucralfate (CARAFATE) 1 GM/10ML suspension Take 10 mLs (1 g total) by mouth 4 (four) times daily -  with meals and at bedtime. Patient not taking: Reported on 07/30/2015 05/20/15   [name], MD     Vital Signs: BP 150/77 mmHg  Pulse 78  Temp(Src) 98.7 F (37.1 C) (Oral)  Resp 14  Ht 5' (1.524 m)  Wt 214 lb 4.6 oz (97.2 kg)  BMI 41.85 kg/m2  SpO2 94%  LMP 02/04/2014  Physical Exam RLQ drain intact, insertion site ok, minimal tenderness; output 115 cc feculent fluid; drain irrigated with 10 cc sterile NS without difficulty  Imaging: Ct Abdomen Pelvis Wo Contrast  08/05/2015  CLINICAL DATA:  Metastatic endometrial carcinoma. Right lower quadrant abscess and bowel perforation. Percutaneous drain catheter placed 07/31/2015. Follow-up scan showed residual deep right pelvic collection with fistula. Optimized drainage is requested. EXAM: CT ABDOMEN AND PELVIS WITHOUT CONTRAST TECHNIQUE: Multidetector CT imaging of the abdomen and pelvis was performed following the standard protocol without IV contrast. Upper abdomen is excluded. Additional scans were obtained after contrast  injection through the drain catheter. COMPARISON:  08/04/2015 and previous FINDINGS: Hepatobiliary: Negative, incompletely visualized. Pancreas: Negative, incompletely visualized. Spleen: Negative, incompletely visualized. Adrenals/Urinary Tract: Negative, incompletely visualized. Adrenal glands not included. Urinary bladder nondistended. Stomach/Bowel: No evidence of obstruction, inflammatory process. Scattered sigmoid diverticula. Vascular/Lymphatic: 18 mm  left para-aortic adenopathy, previously 22 mm. 31 mm aortocaval mass, previously 3.3 cm. 17 mm right external iliac adenopathy, stable. Reproductive: Previous hysterectomy. Other: Persistent Anterior right pelvic fluid collection with gas and fluid, 43 x 48 mm (previously 5.1 x 4.8 cm), with drain catheter in its dependent aspect. There has been significant decompression of the deep right pelvic fluid collection seen previously containing oral contrast material. The largest evident component measures approximately 4.8 x1.3 cm (previously 7.6 x 4.7). After injection of dilute contrast through the drain catheter, there is opacification of this collection which has clearly decreased in size since the previous day's exam indicating decompression by the drain catheter. No new or undrained fluid collections. Musculoskeletal: No suspicious bone lesions identified. Facet DJD in the lower lumbar spine. IMPRESSION: 1. The lower right pelvic fluid collection has significantly decompressed through the drain catheter since the previous day's scan. This could have been due to suction bulb failure due to high output. Therefore, additional drain catheter was not placed, and bulb was changed to a gravity back to allow continuous drainage. 2. No new or undrained fluid collections. 3. Stable pelvic and retroperitoneal adenopathy. Electronically Signed   By: Lucrezia Europe M.D.   On: 08/05/2015 14:29   Ct Abdomen Pelvis W Contrast  08/04/2015  CLINICAL DATA:  "Has felt much worse since last pm "worse than I ever felt with the cancer". Drowsy with IV morphine but pain in abdomen not resolved with 1 mg dose last 0500. Pain is diffuse in abdomen and now particularly left lateral upper abdomen, which was "hot and red" last pm; not as tender in mid abdomen as on admission. Increased drainage which appears fecal from RLQ drain. Passed small flatus this AM, no BM. Nausea intermittently including now, vomited x 1 last pm. Febrile now, no  shaking chills. Hands puffy. Reports bladder pressure and dysuria. Has been getting up to [facility]. Rash thigh and lower abdomen possibly from toradol, resolved. Coughs with tries to breathe deeply" Hx endometrial sarcoma  Dx'd 07/2014 EXAM: CT ABDOMEN AND PELVIS WITH CONTRAST TECHNIQUE: Multidetector CT imaging of the abdomen and pelvis was performed using the standard protocol following bolus administration of intravenous contrast. CONTRAST:  1102m OMNIPAQUE IOHEXOL 300 MG/ML  SOLN COMPARISON:  07/30/2015 FINDINGS: Lung bases: Minimal pleural effusions, greater on the left. Lung base atelectasis. No convincing pneumonia or edema. Effusions are new since the prior study. Atelectasis is similar. Liver, spleen, gallbladder, pancreas, adrenal glands:  Unremarkable. Kidneys, ureters, bladder: No renal masses. Symmetric renal enhancement and excretion. Mild bilateral renal collecting system dilation. Ureters are normal in course and in caliber. Bladder is unremarkable. Uterus:  Surgically absent. Gastrointestinal: The right lower quadrant collection has been partly decompressed with the pigtail catheter. It measures 3.2 cm in thickness where it had measured 4.5 cm. There is still dependent fluid and nondependent air. The portion of the collection that its more medial and inferior, containing the pigtail portion of the catheter, measures approximately 4.8 x 5.1 cm, previously approximately 3.7 x 3.2 cm. Posterior to this there is extraluminal fluid that contains contrast. It extends from the right lower quadrant adjacent to small bowel loops to the posterior pelvic recess. It measures 7.6 x 4.7  cm transversely, previously 6.6 x 3.5 cm. There is no residual free intraperitoneal air. There are no new collections. Inflammatory haziness in straining lies within the fat of the right lower quadrant and pelvis. Colon is mostly decompressed. There are diverticula mostly along the left colon without evidence of diverticulitis. A normal  appendix is visualized. There is mild wall thickening of loops of small bowel adjacent to the right lower quadrant collections. Remainder of the small bowel is normal in caliber with no wall thickening. Stomach shows chronic changes from previous gastric surgery, stable. Lymph nodes: There are enlarged heterogeneous retroperitoneal lymph nodes. An aortocaval node just above the bifurcation of the aorta measures 3.7 x 3.3 cm, without change from the prior study. A left common iliac chain node measures 2.2 cm, also without significant change. There are additional enlarged nodes extending inferior from this, stable. A right external iliac chain node measures 17 mm in short axis, also unchanged. Musculoskeletal: No osteoblastic or osteolytic lesions. Stable degenerative changes of the visualized spine. IMPRESSION: 1. Since the prior CT, percutaneous drainage of the right lower quadrant collection has been performed. Although the lateral portion of this collection has decreased in size, the more medial and inferior portion is larger. In addition, the collection extending from the right lower quadrant into the posterior pelvic recess has increased in size. It contains contrast reflecting continued communication with bowel. There are no new collections and there is no residual free air. Persistent inflammatory change surrounds the collections in the right lower quadrant small bowel. 2. Minimal pleural effusions, new since prior study. Lung base atelectasis is similar to the prior exam. 3. No other changes.  Stable metastatic adenopathy. Electronically Signed   By: Lajean Manes M.D.   On: 08/04/2015 16:34    Labs:  CBC:  Recent Labs  08/03/15 0425 08/04/15 0508 08/05/15 0506 08/06/15 0513  WBC 8.7 9.6 11.4* 10.7*  HGB 9.9* 10.0* 10.1* 10.2*  HCT 29.8* 30.0* 30.9* 31.3*  PLT 262 301 356 400    COAGS:  Recent Labs  08/26/14 1300 07/30/15 2242 07/31/15 0428  INR 0.95 1.49 1.41  APTT 27  --  32     BMP:  Recent Labs  08/03/15 0425 08/04/15 0508 08/05/15 0506 08/06/15 0513  NA 137 139 135 137  K 4.4 4.1 3.0* 3.5  CL 103 104 93* 98*  CO2 _0 GLUCOSE 82 91 77 127*  BUN _1 CALCIUM 7.7* 8.2* 8.1* 8.3*  CREATININE 0.60 0.50 0.80 0.52  GFRNONAA >60 >60 >60 >60  GFRAA >60 >60 >60 >60    LIVER FUNCTION TESTS:  Recent Labs  08/01/15 0509 08/03/15 0425 08/05/15 0506 08/06/15 0513  BILITOT 2.9* 2.8* 2.6* 1.4*  AST 13* 11* 11* 11*  ALT 9* 8* 10* 10*  ALKPHOS 50 52 48 50  PROT 5.5* 5.4* 6.2* 6.1*  ALBUMIN 2.4* 2.3* 2.5* 2.5*    Assessment and Plan: Met endom ca; s/p drainage of RLQ abd abscess 2/24; AF; WBC 10.7(11.4); hgb stable; creat nl; f/u CT yesterday revealed sig decompression of RLQ collection with no new collections; additional drain was not placed; cont with tid drain irrigation; obtain f/u CT within 1 week and drain injection will be needed before removal; if diarrhea continues may consider checking c diff as well   Electronically Signed: D. Rowe Robert 08/06/2015, 11:02 AM   I spent a total of 15 minutes at the the patient's bedside AND on the patient's hospital  floor or unit, greater than 50% of which was counseling/coordinating care for RLQ abscess drain

## 2015-08-06 NOTE — Progress Notes (Signed)
PARENTERAL NUTRITION CONSULT NOTE - INITIAL  Pharmacy Consult for TPN Indication: intolerant to enteral feeds  No Known Allergies  Patient Measurements: Height: 5' (152.4 cm) Weight: 214 lb 4.6 oz (97.2 kg) IBW/kg (Calculated) : 45.5 Adjusted Body Weight: 58.5 kg Usual Weight: ~98kg  Vital Signs: Temp: 98.5 F (36.9 C) (03/02 0332) Temp Source: Oral (03/02 0332) BP: 150/77 mmHg (03/02 0406) Intake/Output from previous day: 03/01 0701 - 03/02 0700 In: 1484.2 [IV Piggyback:680; TPN:594.2] Out: [id number] [Urine:1450; Drains:115] Intake/Output from this shift:    Labs:  Recent Labs  08/04/15 0508 08/05/15 0506 08/06/15 0513  WBC 9.6 11.4* 10.7*  HGB 10.0* 10.1* 10.2*  HCT 30.0* 30.9* 31.3*  PLT 301 356 400     Recent Labs  08/04/15 0508 08/05/15 0506 08/05/15 0511 08/06/15 0513  NA 139 135  --  137  K 4.1 3.0*  --  3.5  CL 104 93*  --  98*  CO2 26 27  --  31  GLUCOSE 91 77  --  127*  BUN 6 6  --  8  CREATININE 0.50 0.80  --  0.52  CALCIUM 8.2* 8.1*  --  8.3*  MG  --  1.8  --  1.9  PHOS  --   --  4.4 3.1  PROT  --  6.2*  --  6.1*  ALBUMIN  --  2.5*  --  2.5*  AST  --  11*  --  11*  ALT  --  10*  --  10*  ALKPHOS  --  48  --  50  BILITOT  --  2.6*  --  1.4*  TRIG  --   --  167*  --    Estimated Creatinine Clearance: 78.2 mL/min (by C-G formula based on Cr of 0.52).    Recent Labs  08/05/15 1754 08/05/15 2327 08/06/15 0523  GLUCAP 104* 104* 110*    Medical History: Past Medical History  Diagnosis Date  . Abnormal uterine bleeding   . Anemia   . Fibroid   . Heart murmur     under 5 yrs of age  . Cancer ([place]) dx'd 07/2014    endometrial stromal sarcoma  . Radiation 12/01/14-01/05/15    pelvis 45 gray  . Radiation 05/18/2015-06/05/15    T4 thoracic spine area 35 gray    Assessment: 60 yo F admitted 2/23 with abdominal pain and noted to have perforated bowel.  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  She has been on Zosyn since admission for IAI and Eraxis was added 2/28.   Clear liquid diet was attempted 2/25 however patient developed severe abdominal pain with nausea/vomiting.    Insulin Requirements in the past 24 hours:  None  Current Nutrition:  NPO  IVF: none  Central access: implanted port 11/19/14 TPN start date: 08/05/15  ASSESSMENT  HPI: 60 yo F admitted 2/23 with abdominal pain and noted to have perforated bowel.  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 and hysterectomy and oophorectomy at [facility] in 2016. She has been on Zosyn since admission for IAI and Eraxis was added 2/28.   Clear liquid diet was attempted 2/25 however patient developed severe abdominal pain with nausea/vomiting.    Significant events:  3/1: IR consulted for drain adjustment (bulb changed)  Today:    Glucose - WNL.  No hx DM.  Electrolytes - Cl low, but improved (98), CorrCa wnl, all other electrolytes wnl.    Renal - stable at patient's baseline.  Estimated CrCl ~ 75-80 ml/min (CG).  24 Net I/O -24ml.  LFTs - TBili elevated but trending down (1.4).  Patient does not appear jaundiced.   TGs mildly elevated (167)  Prealbumin -low (6.4)  NUTRITIONAL GOALS                                                                                             Estimated Goals: 1750-2050  kCal/d, 90-120 Gm/d (1.2-2 Gm/Kg/day using AdjBW); >1.5L Fluid/day Clinimix E 5/15 at a goal rate of 83 ml/hr + 20% fat emulsion at 10 ml/hr to provide: 100g/day protein, 1894 Kcal/day.  PLAN                                                                                                                          At 1800 today:  Increase Clinimix E5/15 at 38ml/hr.  20% fat emulsion at 10 ml/hr.  Plan to advance as tolerated to the goal rate.  TPN to contain standard multivitamins and trace elements.  CBG checks q6h with moderate correction scale if needed.   TPN lab panels on Mondays & Thursdays.  F/u daily.   Biagio Borg 08/06/2015,7:45 AM

## 2015-08-06 NOTE — Progress Notes (Signed)
Occupational Therapy Treatment Patient Details Name: Leatta Juman MRN: [id number] DOB: June 08, 1955 Today's Date: 08/06/2015    History of present illness 60 yo female admitted 2/23 with abdominal pain, h/o endometrial stromal sarcome, underwent surgery in February. S/P placement  of drain for abcess.   OT comments  Limited tx today.  Pt used 3:1 due to urgency and transferred into chair; ADLs had been done  Follow Up Recommendations  Home health OT;Supervision/Assistance - 24 hour    Equipment Recommendations  3 in 1 bedside comode    Recommendations for Other Services      Precautions / Restrictions Precautions Precautions: Fall Precaution Comments: R knee twisted PTA. R drain in abd. Restrictions Weight Bearing Restrictions: No       Mobility Bed Mobility Overal bed mobility: Modified Independent (HOB raised)                Transfers   Equipment used: None Transfers: Sit to/from [facility] Sit to Stand: Supervision Stand pivot transfers: Min guard       General transfer comment: assistance for lines; min guard without RW for safety    Balance                                   ADL                           Toilet Transfer: Min guard;Stand-pivot;BSC   Toileting- Clothing Manipulation and Hygiene: Moderate assistance;Sit to/from stand         General ADL Comments: performed SPT to [facility] then to recliner due to urgency/diarrhea today.  Used foley catheter strap to secure drain bag.  Pt had thinner strap but it was very long. Pt had completed ADL already      Vision                     Perception     Praxis      Cognition   Behavior During Therapy: [facility] for tasks assessed/performed                    General Comments: better ability to express self today    Extremity/Trunk Assessment               Exercises     Shoulder Instructions       General Comments      Pertinent  Vitals/ Pain       Pain Assessment: No/denies pain  Home Living                                          Prior Functioning/Environment              Frequency Min 2X/week     Progress Toward Goals  OT Goals(current goals can now be found in the care plan section)  Progress towards OT goals: Progressing toward goals     Plan      Co-evaluation                 End of Session     Activity Tolerance Patient tolerated treatment well   Patient Left in chair;with call bell/phone within reach   Nurse Communication          Time:  [id number] OT Time Calculation (min): 30 min  Charges: OT General Charges $OT Visit: 1 Procedure OT Treatments $Self Care/Home Management : 8-22 mins  Liara Holm 08/06/2015, 4:06 PM  Lesle Chris, OTR/L [phone] 08/06/2015

## 2015-08-07 LAB — GLUCOSE, CAPILLARY
GLUCOSE-CAPILLARY: 119 mg/dL — AB (ref 65–99)
GLUCOSE-CAPILLARY: 120 mg/dL — AB (ref 65–99)
Glucose-Capillary: 133 mg/dL — ABNORMAL HIGH (ref 65–99)
Glucose-Capillary: 111 mg/dL — ABNORMAL HIGH (ref 65–99)

## 2015-08-07 MED ORDER — PIPERACILLIN-TAZOBACTAM 3.375 G IVPB
3.3750 g | Freq: Three times a day (TID) | INTRAVENOUS | Status: DC
  Administered 2015-08-07 – 2015-08-19 (×36): 3.375 g via INTRAVENOUS
  Filled 2015-08-07 (×37): qty 50

## 2015-08-07 MED ORDER — FAT EMULSION 20 % IV EMUL
240.0000 mL | INTRAVENOUS | Status: AC
  Administered 2015-08-07: 240 mL via INTRAVENOUS
  Filled 2015-08-07: qty 250

## 2015-08-07 MED ORDER — CIPROFLOXACIN HCL 500 MG PO TABS
500.0000 mg | ORAL_TABLET | Freq: Two times a day (BID) | ORAL | Status: DC
  Filled 2015-08-07: qty 1

## 2015-08-07 MED ORDER — TRACE MINERALS CR-CU-MN-SE-ZN 10-1000-500-60 MCG/ML IV SOLN
INTRAVENOUS | Status: AC
  Administered 2015-08-07: 17:00:00 via INTRAVENOUS
  Filled 2015-08-07: qty 1992

## 2015-08-07 NOTE — Progress Notes (Signed)
[facility] Progress Note     Subjective: Pt doing well, no pain.  No N/V, thirsty/hungry, wants CLD.  Ambulating well.  IS up to 1300 today.  Drain output was 253mL/24hr.    Objective: Vital signs in last 24 hours: Temp:  [97.6 F (36.4 C)-98.7 F (37.1 C)] 97.6 F (36.4 C) (03/03 0500) Resp:  [14-26] 18 (03/03 0400) BP: (105-140)/(54-68) 105/54 mmHg (03/03 0400) SpO2:  [93 %-100 %] 100 % (03/03 0400) Weight:  [97.07 kg (214 lb)] 97.07 kg (214 lb) (03/03 0500) Last BM Date: 08/06/15  Intake/Output from previous day: 03/02 0701 - 03/03 0700 In: 1207.5 [IV Piggyback:200; TPN:997.5] Out: 1310 [Urine:1050; Drains:260] Intake/Output this shift:    PE: Gen:  Alert, NAD, pleasant Abd: Obese, soft, minimal distension, NT, +BS, no HSM, IR drain with feculent drainage (280mL/24hr)   Lab Results:   Recent Labs  08/05/15 0506 08/06/15 0513  WBC 11.4* 10.7*  HGB 10.1* 10.2*  HCT 30.9* 31.3*  PLT 356 400   BMET  Recent Labs  08/05/15 0506 08/06/15 0513  NA 135 137  K 3.0* 3.5  CL 93* 98*  CO2 27 31  GLUCOSE 77 127*  BUN 6 8  CREATININE 0.80 0.52  CALCIUM 8.1* 8.3*   PT/INR No results for input(s): LABPROT, INR in the last 72 hours. CMP     Component Value Date/Time   NA 137 08/06/2015 0513   NA 140 07/17/2015 1058   K 3.5 08/06/2015 0513   K 4.1 07/17/2015 1058   CL 98* 08/06/2015 0513   CO2 31 08/06/2015 0513   CO2 23 07/17/2015 1058   GLUCOSE 127* 08/06/2015 0513   GLUCOSE 93 07/17/2015 1058   BUN 8 08/06/2015 0513   BUN 18.4 07/17/2015 1058   CREATININE 0.52 08/06/2015 0513   CREATININE 0.8 07/17/2015 1058   CALCIUM 8.3* 08/06/2015 0513   CALCIUM 9.2 07/17/2015 1058   PROT 6.1* 08/06/2015 0513   PROT 7.1 07/17/2015 1058   ALBUMIN 2.5* 08/06/2015 0513   ALBUMIN 3.5 07/17/2015 1058   AST 11* 08/06/2015 0513   AST 18 07/17/2015 1058   ALT 10* 08/06/2015 0513   ALT 12 07/17/2015 1058   ALKPHOS 50 08/06/2015 0513   ALKPHOS 87  07/17/2015 1058   BILITOT 1.4* 08/06/2015 0513   BILITOT 0.85 07/17/2015 1058   GFRNONAA >60 08/06/2015 0513   GFRAA >60 08/06/2015 0513   Lipase     Component Value Date/Time   LIPASE 18 07/30/2015 1251       Studies/Results: Ct Abdomen Pelvis Wo Contrast  08/05/2015  CLINICAL DATA:  Metastatic endometrial carcinoma. Right lower quadrant abscess and bowel perforation. Percutaneous drain catheter placed 07/31/2015. Follow-up scan showed residual deep right pelvic collection with fistula. Optimized drainage is requested. EXAM: CT ABDOMEN AND PELVIS WITHOUT CONTRAST TECHNIQUE: Multidetector CT imaging of the abdomen and pelvis was performed following the standard protocol without IV contrast. Upper abdomen is excluded. Additional scans were obtained after contrast injection through the drain catheter. COMPARISON:  08/04/2015 and previous FINDINGS: Hepatobiliary: Negative, incompletely visualized. Pancreas: Negative, incompletely visualized. Spleen: Negative, incompletely visualized. Adrenals/Urinary Tract: Negative, incompletely visualized. Adrenal glands not included. Urinary bladder nondistended. Stomach/Bowel: No evidence of obstruction, inflammatory process. Scattered sigmoid diverticula. Vascular/Lymphatic: 18 mm left para-aortic adenopathy, previously 22 mm. 31 mm aortocaval mass, previously 3.3 cm. 17 mm right external iliac adenopathy, stable. Reproductive: Previous hysterectomy. Other: Persistent Anterior right pelvic fluid collection with gas and fluid, 43 x 48 mm (previously 5.1 x 4.8  cm), with drain catheter in its dependent aspect. There has been significant decompression of the deep right pelvic fluid collection seen previously containing oral contrast material. The largest evident component measures approximately 4.8 x1.3 cm (previously 7.6 x 4.7). After injection of dilute contrast through the drain catheter, there is opacification of this collection which has clearly decreased in size  since the previous day's exam indicating decompression by the drain catheter. No new or undrained fluid collections. Musculoskeletal: No suspicious bone lesions identified. Facet DJD in the lower lumbar spine. IMPRESSION: 1. The lower right pelvic fluid collection has significantly decompressed through the drain catheter since the previous day's scan. This could have been due to suction bulb failure due to high output. Therefore, additional drain catheter was not placed, and bulb was changed to a gravity back to allow continuous drainage. 2. No new or undrained fluid collections. 3. Stable pelvic and retroperitoneal adenopathy. Electronically Signed   By: Lucrezia Europe M.D.   On: 08/05/2015 14:29    Anti-infectives: Anti-infectives    Start     Dose/Rate Route Frequency Ordered Stop   08/07/15 0600  ciprofloxacin (CIPRO) tablet 500 mg     500 mg Oral 2 times daily 08/07/15 0541     08/06/15 0830  fluconazole (DIFLUCAN) IVPB 100 mg  Status:  Discontinued     100 mg 50 mL/hr over 60 Minutes Intravenous Every 24 hours 08/06/15 0819 08/06/15 0846   08/05/15 1600  anidulafungin (ERAXIS) 100 mg in sodium chloride 0.9 % 100 mL IVPB     100 mg over 90 Minutes Intravenous Every 24 hours 08/04/15 1509 08/08/15 1559   08/04/15 1600  anidulafungin (ERAXIS) 200 mg in sodium chloride 0.9 % 200 mL IVPB    Comments:  Pharmacy may adjust dosing strength, schedule, rate of infusion, etc as needed to optimize therapy   200 mg over 180 Minutes Intravenous  Once 08/04/15 1509 08/04/15 1928   07/30/15 1645  piperacillin-tazobactam (ZOSYN) IVPB 3.375 g  Status:  Discontinued     3.375 g 12.5 mL/hr over 240 Minutes Intravenous 3 times per day 07/30/15 1634 08/07/15 0541   07/30/15 1545  piperacillin-tazobactam (ZOSYN) IVPB 3.375 g     3.375 g 100 mL/hr over 30 Minutes Intravenous  Once 07/30/15 1541 07/30/15 1757       Assessment/Plan 60 year old female with a 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 Complex 9x4cm fluid collection -No more fevers, drained 267mL/24hr out of IR drain -IR repeated CT on 3/1 and found collection had improved so an additional drain was not attempted -Labs in AM -NPO/TPN -No current plans for operation, no additional free air, patient is stable, less pain -Ambulate and IS -SCD's and lovenox or heparin are okay from our perspective  FEN-NPO except sips/chips, Will discuss with Dr. Johney Maine about starting CLD, patient is requesting it, TPN ID-Leukocytosis, Zosyn Day #9 for e. coli, pan sensitive, Cipro at discharge, Eraxis for anti-fungal coverage Dispo-Repeat CT on 8th unless drainage were to go down significantly. Will leave this up to IR. If she continues to improve she may be able to be discharged at some point with drain and continue with outpatient management.  Consider input with [facility] Vivien Rossetti, MD as far as further Tx.    LOS: 8 days  Nat Christen 08/07/2015, 8:13 AM Pager: [phone]  (7am - 4:30pm M-F; 7am - 11:30am Sa/Su)

## 2015-08-07 NOTE — Progress Notes (Signed)
[facility] PROGRESS NOTE  Erin Santiago [id number] DOB: [birth year] DOA: 07/30/2015 PCP: Merrilee Seashore, MD  Brief interval history  Erin Santiago is a 60 y.o. 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 Patient is status post chemotherapy and radiation to the pelvis and back and was due to start chemotherapy on the day of admission due to progression of disease involving the left abdominal pain. Patient had presented to the ED with complaints of 1-2 day history of diffuse abdominal pain. Patient stated that she woke up around 2 AM one day prior to admission with significant diffuse abdominal pain and when she stood up to go through her bathroom she lost control of her bladder due to probable incontinence. Patient stated that the whole day prior to admission was lethargic and very sleepy and slept all day. Patient does endorse subjective fevers, chills, nausea, emesis, generalized weakness, diarrhea. Patient also endorses some hematochezia. Patient denies chest pain, no constipation, no dysuria, no hematemesis, no melena. Patient does endorse an episode of hematemesis. Patient denies any cough. Patient was seen in the emergency room comprehensive metabolic profile done at a sodium of 133 bicarbonate of 19 BUN of 29 creatinine of 1.06 glucose of 125 AST of 13 ALT of 23 protein of 7.5 bilirubin of 3.4 otherwise is within normal limits. Initial lactic acid level is elevated at 2.67 and repeat lactic acid level was down to 0.93. CBC had a white count of 11.1 with no other abnormalities. Urinalysis pending. Acute abdominal series showed dilated small bowel loops containing residual contrast in the midabdomen most likely represent a component of small bowel obstruction. Bibasilar atelectasis. CT abdomen and pelvis did show bowel perforation.  Large 9 x 4.5 cm complex right lower quadrant and pelvic abscess containing a small amount of contrast suggesting communication with the distal ileum. This could be due to tumor erosion. Stable adenopathy in the abdomen and pelvis. Patient was seen in consultation by general surgery who recommended initially nonoperative percutaneous drainage and admission per hospitalist.  Patient was admitted to the stepdown unit general surgery was consulted patient was placed on bowel rest and monitored. Interventional radiology was also consulted for drain placement. Patient had a percutaneous drain placed on 07/31/2015 and was placed empirically on IV Zosyn from admission. Wound/abscess cultures grew out Escherichia coli. Patient was monitored. Patient's pain was controlled on pain medication. Oncology also assess the patient. Patient was started on clear liquids and as diet was advanced patient was noted to have abdominal pain with a fever and emesis. Patient's diet has subsequently been changed back to clear liquids. Repeat CT abdomen and pelvis pending for 08/04/2015. Will keep an stepdown unit. If patient deteriorates will likely need emergency exploratory laparotomy.      Assessment/Plan: #1 bowel perforation, complex intra-abdominal fluid collection 9 x 4.5 cm right lower quadrant and pelvic Questionable etiology. Patient with fever overnight with emesis when she tried oral intake of food. Patient states no significant improvement with abdominal pain since yesterday 08/03/2015. Possible invasion of cancer/ metastatic endometrial cancer into bowel. Per CT abdomen and pelvis looks like perforation may have been walled off and contained. Blood cultures pending. Patient currently afebrile. Leukocytosis trending down. Continue empiric IV Zosyn. Patient has been seen in consultation by general surgery will recommending nonoperative percutaneous drainage placement. S/p RLQ percutaneous drain 07/31/2015 per IR.   Wound/abscess cultures growing Escherichia coli.  -  Repeat CT scan of abdomen and pelvis performed on 07/27/2015 show a decrease in the size of fluid collection in the lateral portion however more medially it's larger. Radios reported that the collection extending from the right lower quadrant into the posterior pelvic wheezes has also increased in size. This contains contrast. -Gen. surgery and interventional radiology following. -She has been afebrile for the past 24 hours -Blood cultures obtained on 07/30/2015 show no growth 2  -Gen. surgery recommending treating with bowel rest/nothing by mouth along with TNA for 1 week, repeat CT scan then. -Will stop IV Zosyn, transition to oral ciprofloxacin.  #2 history of iron deficiency anemia -Stable, lab work on 08/06/2015 showed hemoglobin of 10.2.   #3 dehydration/metabolic acidosis -Resolving. She was treated with IV fluids.   #4 hypokalemia -Potassium improved to 3.5 from 3.0 received IV potassium on 08/05/2015  #5 lactic acidosis Likely secondary to problem #1 and dehydration. Lactic acid levels trending down. Patient has been pancultured. Abscess growing [place] which is pan sensitive. Continue empiric IV Zosyn.  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 Patient status post chemotherapy and radiation to the pelvis. Patient was to start chemotherapy on the day of admission due to progression of disease involving the left, iliac adenopathy, 2 masses in the right lower quadrant seen on CT scan on 07/21/2015.  Oncology following.  #7 prophylaxis SCDs for DVT prophylaxis.   Code Status: Full Family Communication: Updated patient. No family at bedside. Disposition Plan: Plan to transfer to MedSurg, physical therapy evaluation. Patient is deconditioned.   Consultants:  Gen. surgery: Dr. Excell Seltzer  07/30/2015   oncology: Dr. Marko Plume 07/31/2015  Interventional radiology  Procedures:  CT abdomen and pelvis 07/30/2015  Acute abdominal series 07/30/2015  Right lower quadrant abdominal abscess drain with CT 07/31/2015 for Dr. Annamaria Boots  Antibiotics:  IV Zosyn 07/30/2015  HPI/Subjective: Sitting at bedside chair, states feeling much better today, diarrhea resolving.  Objective: Filed Vitals:   08/07/15 0400 08/07/15 0500  BP: 105/54   Pulse:    Temp:  97.6 F (36.4 C)  Resp: 18     Intake/Output Summary (Last 24 hours) at 08/07/15 0715 Last data filed at 08/07/15 0658  Gross per 24 hour  Intake 1207.5 ml  Output   1310 ml  Net -102.5 ml   Filed Weights   08/05/15 0500 08/06/15 0500 08/07/15 0500  Weight: 98.3 kg (216 lb 11.4 oz) 97.2 kg (214 lb 4.6 oz) 97.07 kg (214 lb)    Exam:   General:  NAD, sitting at bedside chair, states feeling better. She looks better.  Cardiovascular: RRR  Respiratory: Bibasilar crackles  Abdomen: Soft, nontender, nondistended. JP drain in place  Musculoskeletal: No clubbing cyanosis or edema.   Data Reviewed: Basic Metabolic Panel:  Recent Labs Lab 08/02/15 0430 08/03/15 0425 08/04/15 [id number] 08/05/15 0506 08/05/15 0511 08/06/15 0513  NA 140 137 139 135  --  137  K 3.2* 4.4 4.1 3.0*  --  3.5  CL 104 103 104 93*  --  98*  CO2 26 25 26 27   --  31  GLUCOSE 89 82 91 77  --  127*  BUN 11 8 6 6   --  8  CREATININE 0.70 0.60 0.50 0.80  --  0.52  CALCIUM 8.1* 7.7* 8.2* 8.1*  --  8.3*  MG 1.7 2.0  --  1.8  --  1.9  PHOS  --   --   --   --  4.4 3.1   Liver Function Tests:  Recent Labs Lab 08/01/15 0509 08/03/15 0425 08/05/15 0506 08/06/15 0513  AST 13* 11* 11* 11*  ALT 9* 8* 10* 10*  ALKPHOS 50 52 48 50  BILITOT 2.9* 2.8* 2.6* 1.4*  PROT 5.5* 5.4* 6.2* 6.1*  ALBUMIN 2.4* 2.3* 2.5* 2.5*   No results for input(s): LIPASE, AMYLASE in the last 168 hours. No results for input(s): AMMONIA in the last 168  hours. CBC:  Recent Labs Lab 08/01/15 0509 08/01/15 2050 08/02/15 0430 08/03/15 0425 08/04/15 0508 08/05/15 0506 08/06/15 0513  WBC 9.1 10.4 10.0 8.7 9.6 11.4* 10.7*  NEUTROABS 8.0* 8.9*  --   --  7.3 9.0* 8.7*  HGB 9.7* 9.8* 9.9* 9.9* 10.0* 10.1* 10.2*  HCT 29.9* 29.8* 30.1* 29.8* 30.0* 30.9* 31.3*  MCV 86.4 85.1 85.3 86.1 86.5 84.4 85.1  PLT 196 225 230 262 301 356 400   Cardiac Enzymes: No results for input(s): CKTOTAL, CKMB, CKMBINDEX, TROPONINI in the last 168 hours. BNP (last 3 results) No results for input(s): BNP in the last 8760 hours.  ProBNP (last 3 results) No results for input(s): PROBNP in the last 8760 hours.  CBG:  Recent Labs Lab 08/05/15 2327 08/06/15 0523 08/06/15 1154 08/06/15 1829 08/07/15 0535  GLUCAP 104* 110* 124* 113* 119*    Recent Results (from the past 240 hour(s))  Blood culture (routine x 2)     Status: None   Collection Time: 07/30/15  6:31 PM  Result Value Ref Range Status   Specimen Description BLOOD RIGHT PORT  Final   Special Requests BOTTLES DRAWN AEROBIC AND ANAEROBIC 5CC EACH  Final   Culture   Final    NO GROWTH 5 DAYS Performed at [facility]    Report Status 08/04/2015 FINAL  Final  Blood culture (routine x 2)     Status: None   Collection Time: 07/30/15  6:33 PM  Result Value Ref Range Status   Specimen Description BLOOD LEFT WRIST  Final   Special Requests BOTTLES DRAWN AEROBIC AND ANAEROBIC 5CC EACH  Final   Culture   Final    NO GROWTH 5 DAYS Performed at [facility]    Report Status 08/04/2015 FINAL  Final  Culture, Urine     Status: None   Collection Time: 07/30/15  7:36 PM  Result Value Ref Range Status   Specimen Description URINE, RANDOM  Final   Special Requests NONE  Final   Culture   Final    3,000 COLONIES/mL INSIGNIFICANT GROWTH Performed at [facility]    Report Status 07/31/2015 FINAL  Final  MRSA PCR Screening     Status: None   Collection Time: 07/30/15  8:11 PM   Result Value Ref Range Status   MRSA by PCR NEGATIVE NEGATIVE Final    Comment:        The GeneXpert MRSA Assay (FDA approved for NASAL specimens only), is one component of a comprehensive MRSA colonization surveillance program. It is not intended to diagnose MRSA infection nor to guide or monitor treatment for MRSA infections.   Culture, routine-abscess     Status: None   Collection Time: 07/31/15  3:51 PM  Result Value Ref Range Status   Specimen Description ABSCESS RIGHT LOWER ABDOMINAL QUADRANT  Final   Special Requests Normal  Final   Gram Stain   Final    FEW WBC PRESENT,BOTH PMN AND MONONUCLEAR NO SQUAMOUS EPITHELIAL CELLS SEEN FEW GRAM NEGATIVE RODS Performed at  [facility]    Culture   Final    MODERATE ESCHERICHIA COLI Performed at [facility]    Report Status 08/03/2015 FINAL  Final   Organism ID, Bacteria ESCHERICHIA COLI  Final      Susceptibility   Escherichia coli - MIC*    AMPICILLIN <=2 SENSITIVE Sensitive     AMPICILLIN/SULBACTAM <=2 SENSITIVE Sensitive     CEFEPIME <=1 SENSITIVE Sensitive     CEFTAZIDIME <=1 SENSITIVE Sensitive     CEFTRIAXONE <=1 SENSITIVE Sensitive     CIPROFLOXACIN <=0.25 SENSITIVE Sensitive     GENTAMICIN <=1 SENSITIVE Sensitive     IMIPENEM <=0.25 SENSITIVE Sensitive     PIP/TAZO <=4 SENSITIVE Sensitive     TOBRAMYCIN <=1 SENSITIVE Sensitive     TRIMETH/SULFA Value in next row Sensitive      <=20 SENSITIVE(NOTE)    * MODERATE ESCHERICHIA COLI  Anaerobic culture     Status: None   Collection Time: 07/31/15  3:51 PM  Result Value Ref Range Status   Specimen Description ABSCESS RIGHT LOWER ABDOMINAL QUADRANT  Final   Special Requests Normal  Final   Gram Stain   Final    FEW WBC PRESENT,BOTH PMN AND MONONUCLEAR NO SQUAMOUS EPITHELIAL CELLS SEEN FEW GRAM NEGATIVE RODS Performed at [facility]    Culture   Final    BACTEROIDES VULGATUS Note: BETA LACTAMASE POSITIVE Performed at [facility]    Report Status 08/05/2015 FINAL  Final  Culture, Urine     Status: None   Collection Time: 08/04/15 12:24 PM  Result Value Ref Range Status   Specimen Description URINE, RANDOM  Final   Special Requests NONE  Final   Culture   Final    NO GROWTH 1 DAY Performed at [facility]    Report Status 08/05/2015 FINAL  Final  Body fluid culture     Status: None (Preliminary result)   Collection Time: 08/04/15  6:30 PM  Result Value Ref Range Status   Specimen Description ABDOMEN PERFORATED BOWEL  Final   Special Requests NONE  Final   Gram Stain   Final    RARE WBC PRESENT, PREDOMINANTLY MONONUCLEAR GRAM NEGATIVE RODS GRAM POSITIVE COCCI IN CHAINS IN PAIRS GRAM POSITIVE RODS    Culture   Final    MULTIPLE ORGANISMS PRESENT, NONE PREDOMINANT Performed at [facility]    Report Status PENDING  Incomplete     Studies: Ct Abdomen Pelvis Wo Contrast  08/05/2015  CLINICAL DATA:  Metastatic endometrial carcinoma. Right lower quadrant abscess and bowel perforation. Percutaneous drain catheter placed 07/31/2015. Follow-up scan showed residual deep right pelvic collection with fistula. Optimized drainage is requested. EXAM: CT ABDOMEN AND PELVIS WITHOUT CONTRAST TECHNIQUE: Multidetector CT imaging of the abdomen and pelvis was performed following the standard protocol without IV contrast. Upper abdomen is excluded. Additional scans were obtained after contrast injection through the drain catheter. COMPARISON:  08/04/2015 and previous FINDINGS: Hepatobiliary: Negative, incompletely visualized. Pancreas: Negative, incompletely visualized. Spleen: Negative, incompletely visualized. Adrenals/Urinary Tract: Negative, incompletely visualized. Adrenal glands not included. Urinary bladder nondistended. Stomach/Bowel: No evidence of obstruction, inflammatory process. Scattered sigmoid diverticula. Vascular/Lymphatic: 18 mm left para-aortic adenopathy, previously 22 mm. 31 mm aortocaval  mass, previously 3.3 cm. 17 mm right external iliac adenopathy, stable. Reproductive: Previous hysterectomy. Other: Persistent Anterior right pelvic fluid collection with gas and fluid, 43 x 48 mm (previously 5.1 x 4.8 cm), with drain catheter in its dependent aspect. There has been  significant decompression of the deep right pelvic fluid collection seen previously containing oral contrast material. The largest evident component measures approximately 4.8 x1.3 cm (previously 7.6 x 4.7). After injection of dilute contrast through the drain catheter, there is opacification of this collection which has clearly decreased in size since the previous day's exam indicating decompression by the drain catheter. No new or undrained fluid collections. Musculoskeletal: No suspicious bone lesions identified. Facet DJD in the lower lumbar spine. IMPRESSION: 1. The lower right pelvic fluid collection has significantly decompressed through the drain catheter since the previous day's scan. This could have been due to suction bulb failure due to high output. Therefore, additional drain catheter was not placed, and bulb was changed to a gravity back to allow continuous drainage. 2. No new or undrained fluid collections. 3. Stable pelvic and retroperitoneal adenopathy. Electronically Signed   By: Lucrezia Europe M.D.   On: 08/05/2015 14:29    Scheduled Meds: . anidulafungin  100 mg Intravenous Q24H  . antiseptic oral rinse  7 mL Mouth Rinse BID  . ciprofloxacin  500 mg Oral BID  . insulin aspart  0-15 Units Subcutaneous 4 times per day  . nystatin   Topical BID  . sodium chloride flush  3 mL Intravenous Q12H   Continuous Infusions: . Marland KitchenTPN (CLINIMIX-E) Adult 65 mL/hr at 08/06/15 1707   And  . 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 If 7PM-7AM, please contact night-coverage at [email or web address], password [facility] 08/07/2015, 7:15 AM  LOS: 8 days

## 2015-08-07 NOTE — Progress Notes (Signed)
Nutrition Follow-up  DOCUMENTATION CODES:   Not applicable  INTERVENTION:  -RD to continue to monitor for needs -Diet advancement per MD -TPN per Pharmacy NUTRITION DIAGNOSIS:   Inadequate oral intake related to altered GI function as evidenced by per patient/family report. 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 Patient had presented to the ED with complaints of 1-2 day history of diffuse abdominal pain.   [name] continues to be NPO, with sips of CLD from time to time. She complains of thirst, but no nausea/vomiting. Less abd pain than previous visit.  Per surgery, they will discuss with Dr. Johney Maine if patient will begin CLD.   Plan per pharmacy: At 1800 today:  Increase Clinimix E5/15 to 83 ml/hr.  20% fat emulsion at 10 ml/hr.  TPN @ 83 will provide 1894 calories (100% of needs), 100g protein (83.3% of estimated needs)  Per MD note, will tx to MedSurg  Continue to follow for diet advancement. Follow-Up for tolerance.  Labs: Ca 8.3  Diet Order:  Diet NPO time specified Except for: Ice Chips, Sips with Meds, Other (See Comments) .TPN (CLINIMIX-E) Adult .TPN (CLINIMIX-E) Adult  Skin:  Reviewed, no issues  Last BM:  2/23  Height:   Ht Readings from Last 1 Encounters:  07/30/15 5' (1.524 m)    Weight:   Wt Readings from Last 1 Encounters:  08/07/15 214 lb (97.07 kg)    Ideal Body Weight:  45.45 kg  BMI:  Body mass index is 41.79 kg/(m^2).  Estimated Nutritional Needs:   Kcal:  1750-2050 (30-35 cal/kg ABW)  Protein:  120-150  grams (1.2-1.5g/kg)  Fluid:  >/= 1.5L  EDUCATION NEEDS:   No education needs identified at this time  Erin Santiago. Clayborne Divis, MS, RD LDN After Hours/Weekend Pager [phone]

## 2015-08-07 NOTE — Progress Notes (Signed)
PARENTERAL NUTRITION CONSULT NOTE - INITIAL  Pharmacy Consult for TPN Indication: Perforated bowel; intolerant to enteral feeds  No Known Allergies  Patient Measurements: Height: 5' (152.4 cm) Weight: 214 lb (97.07 kg) IBW/kg (Calculated) : 45.5 Adjusted Body Weight: 58.5 kg Usual Weight: ~98kg  Vital Signs: Temp: 97.6 F (36.4 C) (03/03 0500) Temp Source: Oral (03/03 0500) BP: 105/54 mmHg (03/03 0400) Intake/Output from previous day: 03/02 0701 - 03/03 0700 In: 1207.5 [IV Piggyback:200; TPN:997.5] Out: 1310 [Urine:1050; Drains:260] Intake/Output from this shift:    Labs:  Recent Labs  08/05/15 0506 08/06/15 0513  WBC 11.4* 10.7*  HGB 10.1* 10.2*  HCT 30.9* 31.3*  PLT 356 400     Recent Labs  08/05/15 0506 08/05/15 0511 08/06/15 0513  NA 135  --  137  K 3.0*  --  3.5  CL 93*  --  98*  CO2 27  --  31  GLUCOSE 77  --  127*  BUN 6  --  8  CREATININE 0.80  --  0.52  CALCIUM 8.1*  --  8.3*  MG 1.8  --  1.9  PHOS  --  4.4 3.1  PROT 6.2*  --  6.1*  ALBUMIN 2.5*  --  2.5*  AST 11*  --  11*  ALT 10*  --  10*  ALKPHOS 48  --  50  BILITOT 2.6*  --  1.4*  PREALBUMIN  --   --  6.4*  TRIG  --  167* 188*   Estimated Creatinine Clearance: 78 mL/min (by C-G formula based on Cr of 0.52).    Recent Labs  08/06/15 1154 08/06/15 1829 08/07/15 0535  GLUCAP 124* 113* 119*    Medical History: Past Medical History  Diagnosis Date  . Abnormal uterine bleeding   . Anemia   . Fibroid   . Heart murmur     under 5 yrs of age  . Cancer ([place]) dx'd 07/2014    endometrial stromal sarcoma  . Radiation 12/01/14-01/05/15    pelvis 45 gray  . Radiation 05/18/2015-06/05/15    T4 thoracic spine area 35 gray    Assessment: 60 yo F admitted 2/23 with abdominal pain and noted to have perforated bowel.  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  She has been on Zosyn since admission for IAI and Eraxis was added 2/28.   Clear liquid diet was attempted 2/25 however patient developed severe abdominal pain with nausea/vomiting.    Insulin Requirements during previous day: 2 units  Current Nutrition:  NPO  IVF: none  Central access: implanted port 11/19/14 TPN start date: 08/05/15  ASSESSMENT                                                                                                          HPI: 60 yo F admitted 2/23 with abdominal pain and noted to have perforated bowel.  PMH significant for endometrial stromal sarcoma with bilateral pelvic  node involvement, diverticulosis, gastric sleeve at [facility] and hysterectomy and oophorectomy at [facility] in 2016. She has been on Zosyn since admission for IAI and Eraxis was added 2/28.   Clear liquid diet was attempted 2/25 however patient developed severe abdominal pain with nausea/vomiting.    Significant events:  3/1: IR consulted for drain adjustment (bulb changed)  Today: No new labs; 3/2 labs below  Glucose - WNL.  No hx DM.  Electrolytes - Cl low, but improved (98), CorrCa wnl, all other electrolytes wnl.    Renal - stable at patient's baseline.  Estimated CrCl ~ 75-80 ml/min (CG).  24 Net I/O -76ml.  LFTs - TBili elevated but trending down (1.4).  Patient does not appear jaundiced.   TGs mildly elevated (188)  Prealbumin -low (6.4)  NUTRITIONAL GOALS                                                                                             Estimated Goals: 1750-2050  kCal/d, 90-120 Gm/d (1.2-2 Gm/Kg/day using AdjBW); >1.5L Fluid/day Clinimix E 5/15 at a goal rate of 83 ml/hr + 20% fat emulsion at 10 ml/hr to provide: 100g/day protein, 1894 Kcal/day.  PLAN                                                                                                                         At 1800 today:  Increase Clinimix E5/15 to 83  ml/hr.  20% fat emulsion at 10 ml/hr.  TPN to contain standard multivitamins and trace elements.  CBG checks q6h with moderate correction scale if needed; can liberalize if still controlled at goal rate.  BMP, Mg, Phos tomorrow AM; TPN lab panels on Mondays & Thursdays.  F/u daily.   Reuel Boom, PharmD, BCPS Pager: [phone] 08/07/2015, 7:53 AM

## 2015-08-07 NOTE — Progress Notes (Signed)
Pharmacy Antibiotic Note  Erin Santiago is a 60 y.o. female admitted on 07/30/2015 with intra-abdominal infection.  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 She is status post chemotherapy and radiation.  Presents with abdominal pain, fevers, N/V/D.  LA elevated.  CT of abdomen and pelvis revealed bowel perforation with a large 9.4cm complex fluid collection in the RLQ, possible erosion of tumor.  Pharmacy has been consulted for Zosyn dosing. No plans for surgical intervention at this time but may need fistula repair at some point.  Likely repeat CT abd later this week per notes.   Today, 08/07/2015: Day #9 pip/tazo Temp: afebrile WBC: sl elevated, stable Renal: SCr low; CrCl 84 N  Plan:  Zosyn 3.375g IV q8h (4 hour infusion).   Pansensitive E coli, but plan is to continue Zosyn while inpatient; with Cipro at discharge  Will sign off as do not anticipate need for renal adjustment.   Height: 5' (152.4 cm) Weight: 214 lb (97.07 kg) IBW/kg (Calculated) : 45.5  Temp (24hrs), Avg:98.1 F (36.7 C), Min:97.6 F (36.4 C), Max:98.7 F (37.1 C)   Recent Labs Lab 08/02/15 0430 08/03/15 0425 08/04/15 0508 08/05/15 0506 08/06/15 0513  WBC 10.0 8.7 9.6 11.4* 10.7*  CREATININE 0.70 0.60 0.50 0.80 0.52    Estimated Creatinine Clearance: 78 mL/min (by C-G formula based on Cr of 0.52).    No Known Allergies  Antimicrobials this admission: 2/23 Zosyn >> 2/28 Eraxis >>  Dose adjustments this admission: -  Microbiology results: 2/23 BCx: NGF 2/23 UCx: 3K insignificant growth 2/23 MRSA PCR neg 2/24 abscess: moderate E.Coli (pansensitive) 2/24 abscess anaerobic: B. Vulgatus (beta lactamase +) 2/28: Body fluid: GNR, GPR, GPC; none predominant 2/28 UCx: NGF  Thank you for allowing pharmacy to be a part of this patient's care.  Reuel Boom,  PharmD, BCPS Pager: [phone] 08/07/2015, 8:58 AM

## 2015-08-07 NOTE — Progress Notes (Signed)
Physical Therapy Treatment Patient Details Name: Erin Santiago MRN: [id number] DOB: 02/12/1956 Today's Date: 08/07/2015    History of Present Illness 60 yo female admitted 2/23 with abdominal pain, h/o endometrial stromal sarcome, underwent surgery in February. S/P placement  of drain for abcess.    PT Comments    Pt OOB in recliner feeling better.  Assisted with amb a great distance with no c/o's.  Follow Up Recommendations  Home health PT;Supervision - Intermittent;No PT follow up (pending progress)     Equipment Recommendations  Rolling walker with 5" wheels    Recommendations for Other Services       Precautions / Restrictions Precautions Precautions: Fall Precaution Comments: R ABD drain Restrictions Weight Bearing Restrictions: No    Mobility  Bed Mobility               General bed mobility comments: OOB in recliner  Transfers Overall transfer level: Needs assistance Equipment used: None Transfers: Sit to/from Stand Sit to Stand: Supervision         General transfer comment: assistance for lines; min guard without RW for safety  Ambulation/Gait Ambulation/Gait assistance: Min guard Ambulation Distance (Feet): 300 Feet Assistive device: Rolling walker (2 wheeled) Gait Pattern/deviations: Step-through pattern;Decreased stride length Gait velocity: WFL   General Gait Details: good alternating gait/tolerated well   Stairs            Wheelchair Mobility    Modified Rankin (Stroke Patients Only)       Balance                                    Cognition Arousal/Alertness: Awake/alert Behavior During Therapy: WFL for tasks assessed/performed Overall Cognitive Status: Within Functional Limits for tasks assessed                      Exercises      General Comments        Pertinent Vitals/Pain Pain Assessment: No/denies pain    Home Living                      Prior Function            PT  Goals (current goals can now be found in the care plan section) Progress towards PT goals: Progressing toward goals    Frequency  Min 3X/week    PT Plan Current plan remains appropriate    Co-evaluation             End of Session Equipment Utilized During Treatment: Gait belt Activity Tolerance: Patient tolerated treatment well Patient left: in chair;with call bell/phone within reach     Time: [id number] PT Time Calculation (min) (ACUTE ONLY): 34 min  Charges:  $Gait Training: 8-22 mins $Therapeutic Activity: 8-22 mins                    G Codes:      Rica Koyanagi  PTA [facility]  Acute  Rehab Pager      [phone]

## 2015-08-07 NOTE — Progress Notes (Signed)
Patient ID: Erin Santiago, female   DOB: 02-Oct-1955, 60 y.o.   MRN: [id number]    Referring Physician(s): CCS  Supervising Physician: Sandi Mariscal  Chief Complaint: Right lower quadrant abscess   Subjective:  Pt sitting up in chair; has ambulated; no new c/o  Allergies: Review of patient's allergies indicates no known allergies.  Medications: Prior to Admission medications   Medication Sig Start Date End Date Taking? Authorizing Provider  calcium carbonate (TUMS - DOSED IN MG ELEMENTAL CALCIUM) 500 MG chewable tablet Chew 2 tablets by mouth daily.    Yes Historical Provider, MD  dexamethasone (DECADRON) 4 MG tablet Take 2 tablets by mouth once a day on the day after chemotherapy and then take 2 tablets two times a day for 2 days. Take with food. 07/27/15  Yes Lennis Marion Downer, MD  docusate sodium (COLACE) 100 MG capsule Take 100 mg by mouth 2 (two) times daily as needed for mild constipation. Reported on 05/28/2015 07/17/14  Yes Historical Provider, MD  hydrocortisone 2.5 % cream Apply 1 application topically as needed (itch/psoriasis.). Reported on 07/17/2015 05/19/14  Yes Historical Provider, MD  lidocaine-prilocaine (EMLA) cream Apply to Porta-cath 1-2 hrs prior to access. Cover with [employer]. 08/18/14  Yes Lennis Marion Downer, MD  LORazepam (ATIVAN) 1 MG tablet Place 1/2 to 1 tablet under the tongue or swallow every 6 hours as needed for nausea 07/22/15  Yes Lennis P Livesay, MD  meloxicam (MOBIC) 15 MG tablet Take 15 mg by mouth daily.  06/09/14  Yes Historical Provider, MD  ondansetron (ZOFRAN) 8 MG tablet Take 1 tablet (8 mg total) by mouth every 8 (eight) hours as needed for nausea or vomiting. 07/22/15  Yes Lennis Marion Downer, MD  prochlorperazine (COMPAZINE) 10 MG tablet Take 1 tablet (10 mg total) by mouth every 6 (six) hours as needed (Nausea or vomiting). 07/27/15  Yes Lennis Marion Downer, MD  Psyllium 28.3 % POWD Take 1 packet by mouth daily as needed (for fiber). Reported on 05/26/2015    Yes Historical Provider, MD  Resveratrol 250 MG CAPS Take 250 mg by mouth 2 (two) times daily. Reported on 05/26/2015   Yes Historical Provider, MD  ferrous fumarate (HEMOCYTE) 325 (106 FE) MG TABS tablet Take 1 tab daily on an empty stomach with OJ or Vitamin C 500 mg Patient not taking: Reported on 07/09/2015 09/15/14   Lennis Marion Downer, MD  sucralfate (CARAFATE) 1 GM/10ML suspension Take 10 mLs (1 g total) by mouth 4 (four) times daily -  with meals and at bedtime. Patient not taking: Reported on 07/30/2015 05/20/15   [name], MD     Vital Signs: BP 105/54 mmHg  Pulse 78  Temp(Src) 98.5 F (36.9 C) (Oral)  Resp 18  Ht 5' (1.524 m)  Wt 214 lb (97.07 kg)  BMI 41.79 kg/m2  SpO2 98%  LMP 02/04/2014  Physical Exam RLQ drain intact, insertion site ok, mildly tender, output 260 cc feculent fluid; drain flushes easily  Imaging: Ct Abdomen Pelvis Wo Contrast  08/05/2015  CLINICAL DATA:  Metastatic endometrial carcinoma. Right lower quadrant abscess and bowel perforation. Percutaneous drain catheter placed 07/31/2015. Follow-up scan showed residual deep right pelvic collection with fistula. Optimized drainage is requested. EXAM: CT ABDOMEN AND PELVIS WITHOUT CONTRAST TECHNIQUE: Multidetector CT imaging of the abdomen and pelvis was performed following the standard protocol without IV contrast. Upper abdomen is excluded. Additional scans were obtained after contrast injection through the drain catheter. COMPARISON:  08/04/2015 and previous FINDINGS: Hepatobiliary:  Negative, incompletely visualized. Pancreas: Negative, incompletely visualized. Spleen: Negative, incompletely visualized. Adrenals/Urinary Tract: Negative, incompletely visualized. Adrenal glands not included. Urinary bladder nondistended. Stomach/Bowel: No evidence of obstruction, inflammatory process. Scattered sigmoid diverticula. Vascular/Lymphatic: 18 mm left para-aortic adenopathy, previously 22 mm. 31 mm aortocaval mass, previously  3.3 cm. 17 mm right external iliac adenopathy, stable. Reproductive: Previous hysterectomy. Other: Persistent Anterior right pelvic fluid collection with gas and fluid, 43 x 48 mm (previously 5.1 x 4.8 cm), with drain catheter in its dependent aspect. There has been significant decompression of the deep right pelvic fluid collection seen previously containing oral contrast material. The largest evident component measures approximately 4.8 x1.3 cm (previously 7.6 x 4.7). After injection of dilute contrast through the drain catheter, there is opacification of this collection which has clearly decreased in size since the previous day's exam indicating decompression by the drain catheter. No new or undrained fluid collections. Musculoskeletal: No suspicious bone lesions identified. Facet DJD in the lower lumbar spine. IMPRESSION: 1. The lower right pelvic fluid collection has significantly decompressed through the drain catheter since the previous day's scan. This could have been due to suction bulb failure due to high output. Therefore, additional drain catheter was not placed, and bulb was changed to a gravity back to allow continuous drainage. 2. No new or undrained fluid collections. 3. Stable pelvic and retroperitoneal adenopathy. Electronically Signed   By: Lucrezia Europe M.D.   On: 08/05/2015 14:29   Ct Abdomen Pelvis W Contrast  08/04/2015  CLINICAL DATA:  "Has felt much worse since last pm "worse than I ever felt with the cancer". Drowsy with IV morphine but pain in abdomen not resolved with 1 mg dose last 0500. Pain is diffuse in abdomen and now particularly left lateral upper abdomen, which was "hot and red" last pm; not as tender in mid abdomen as on admission. Increased drainage which appears fecal from RLQ drain. Passed small flatus this AM, no BM. Nausea intermittently including now, vomited x 1 last pm. Febrile now, no shaking chills. Hands puffy. Reports bladder pressure and dysuria. Has been getting up  to [facility]. Rash thigh and lower abdomen possibly from toradol, resolved. Coughs with tries to breathe deeply" Hx endometrial sarcoma  Dx'd 07/2014 EXAM: CT ABDOMEN AND PELVIS WITH CONTRAST TECHNIQUE: Multidetector CT imaging of the abdomen and pelvis was performed using the standard protocol following bolus administration of intravenous contrast. CONTRAST:  120m OMNIPAQUE IOHEXOL 300 MG/ML  SOLN COMPARISON:  07/30/2015 FINDINGS: Lung bases: Minimal pleural effusions, greater on the left. Lung base atelectasis. No convincing pneumonia or edema. Effusions are new since the prior study. Atelectasis is similar. Liver, spleen, gallbladder, pancreas, adrenal glands:  Unremarkable. Kidneys, ureters, bladder: No renal masses. Symmetric renal enhancement and excretion. Mild bilateral renal collecting system dilation. Ureters are normal in course and in caliber. Bladder is unremarkable. Uterus:  Surgically absent. Gastrointestinal: The right lower quadrant collection has been partly decompressed with the pigtail catheter. It measures 3.2 cm in thickness where it had measured 4.5 cm. There is still dependent fluid and nondependent air. The portion of the collection that its more medial and inferior, containing the pigtail portion of the catheter, measures approximately 4.8 x 5.1 cm, previously approximately 3.7 x 3.2 cm. Posterior to this there is extraluminal fluid that contains contrast. It extends from the right lower quadrant adjacent to small bowel loops to the posterior pelvic recess. It measures 7.6 x 4.7 cm transversely, previously 6.6 x 3.5 cm. There is no residual free  intraperitoneal air. There are no new collections. Inflammatory haziness in straining lies within the fat of the right lower quadrant and pelvis. Colon is mostly decompressed. There are diverticula mostly along the left colon without evidence of diverticulitis. A normal appendix is visualized. There is mild wall thickening of loops of small bowel adjacent  to the right lower quadrant collections. Remainder of the small bowel is normal in caliber with no wall thickening. Stomach shows chronic changes from previous gastric surgery, stable. Lymph nodes: There are enlarged heterogeneous retroperitoneal lymph nodes. An aortocaval node just above the bifurcation of the aorta measures 3.7 x 3.3 cm, without change from the prior study. A left common iliac chain node measures 2.2 cm, also without significant change. There are additional enlarged nodes extending inferior from this, stable. A right external iliac chain node measures 17 mm in short axis, also unchanged. Musculoskeletal: No osteoblastic or osteolytic lesions. Stable degenerative changes of the visualized spine. IMPRESSION: 1. Since the prior CT, percutaneous drainage of the right lower quadrant collection has been performed. Although the lateral portion of this collection has decreased in size, the more medial and inferior portion is larger. In addition, the collection extending from the right lower quadrant into the posterior pelvic recess has increased in size. It contains contrast reflecting continued communication with bowel. There are no new collections and there is no residual free air. Persistent inflammatory change surrounds the collections in the right lower quadrant small bowel. 2. Minimal pleural effusions, new since prior study. Lung base atelectasis is similar to the prior exam. 3. No other changes.  Stable metastatic adenopathy. Electronically Signed   By: Lajean Manes M.D.   On: 08/04/2015 16:34    Labs:  CBC:  Recent Labs  08/03/15 0425 08/04/15 0508 08/05/15 0506 08/06/15 0513  WBC 8.7 9.6 11.4* 10.7*  HGB 9.9* 10.0* 10.1* 10.2*  HCT 29.8* 30.0* 30.9* 31.3*  PLT 262 301 356 400    COAGS:  Recent Labs  08/26/14 1300 07/30/15 2242 07/31/15 0428  INR 0.95 1.49 1.41  APTT 27  --  32    BMP:  Recent Labs  08/03/15 0425 08/04/15 0508 08/05/15 0506 08/06/15 0513  NA  137 139 135 137  K 4.4 4.1 3.0* 3.5  CL 103 104 93* 98*  CO2 '25 26 27 31  '$ GLUCOSE 82 91 77 127*  BUN '8 6 6 8  '$ CALCIUM 7.7* 8.2* 8.1* 8.3*  CREATININE 0.60 0.50 0.80 0.52  GFRNONAA >60 >60 >60 >60  GFRAA >60 >60 >60 >60    LIVER FUNCTION TESTS:  Recent Labs  08/01/15 0509 08/03/15 0425 08/05/15 0506 08/06/15 0513  BILITOT 2.9* 2.8* 2.6* 1.4*  AST 13* 11* 11* 11*  ALT 9* 8* 10* 10*  ALKPHOS 50 52 48 50  PROT 5.5* 5.4* 6.2* 6.1*  ALBUMIN 2.4* 2.3* 2.5* 2.5*    Assessment and Plan: Met endom ca; s/p drainage of RLQ abd abscess 2/24; AF; no new lab data to review; f/u CT 3/1 revealed sig decompression of RLQ collection with no new collections; additional drain was not placed; cont with tid drain irrigation; obtain f/u CT within 1 week and drain injection will be needed before removal; can also f/u at drain clinic if pt goes home with drain   Electronically Signed: D. Rowe Robert 08/07/2015, 12:11 PM   I spent a total of 15 minutes at the the patient's bedside AND on the patient's hospital floor or unit, greater than 50% of which was counseling/coordinating  care for RLQ abscess drain

## 2015-08-08 LAB — CBC
HCT: 32.5 % — ABNORMAL LOW (ref 36.0–46.0)
Hemoglobin: 10.2 g/dL — ABNORMAL LOW (ref 12.0–15.0)
MCH: 27.3 pg (ref 26.0–34.0)
MCHC: 31.4 g/dL (ref 30.0–36.0)
MCV: 87.1 fL (ref 78.0–100.0)
PLATELETS: 469 10*3/uL — AB (ref 150–400)
RBC: 3.73 MIL/uL — AB (ref 3.87–5.11)
RDW: 15.2 % (ref 11.5–15.5)
WBC: 8.8 10*3/uL (ref 4.0–10.5)

## 2015-08-08 LAB — BASIC METABOLIC PANEL
Anion gap: 7 (ref 5–15)
BUN: 13 mg/dL (ref 6–20)
CALCIUM: 8.4 mg/dL — AB (ref 8.9–10.3)
CO2: 25 mmol/L (ref 22–32)
CREATININE: 0.56 mg/dL (ref 0.44–1.00)
Chloride: 104 mmol/L (ref 101–111)
GFR calc non Af Amer: >60 mL/min (ref >60)
Glucose, Bld: 125 mg/dL — ABNORMAL HIGH (ref 65–99)
Potassium: 4 mmol/L (ref 3.5–5.1)
SODIUM: 136 mmol/L (ref 135–145)

## 2015-08-08 LAB — GLUCOSE, CAPILLARY
GLUCOSE-CAPILLARY: 110 mg/dL — AB (ref 65–99)
GLUCOSE-CAPILLARY: 112 mg/dL — AB (ref 65–99)
GLUCOSE-CAPILLARY: 132 mg/dL — AB (ref 65–99)
GLUCOSE-CAPILLARY: 141 mg/dL — AB (ref 65–99)

## 2015-08-08 LAB — PHOSPHORUS: Phosphorus: 3.5 mg/dL (ref 2.5–4.6)

## 2015-08-08 LAB — MAGNESIUM: MAGNESIUM: 2.2 mg/dL (ref 1.7–2.4)

## 2015-08-08 MED ORDER — METHOCARBAMOL 1000 MG/10ML IJ SOLN
1000.0000 mg | Freq: Four times a day (QID) | INTRAVENOUS | Status: DC | PRN
  Filled 2015-08-08: qty 10

## 2015-08-08 MED ORDER — OXYCODONE HCL 5 MG PO TABS
5.0000 mg | ORAL_TABLET | ORAL | Status: DC | PRN
  Administered 2015-08-23 – 2015-08-26 (×2): 5 mg via ORAL
  Filled 2015-08-08 (×2): qty 1

## 2015-08-08 MED ORDER — ACETAMINOPHEN 500 MG PO TABS
1000.0000 mg | ORAL_TABLET | Freq: Three times a day (TID) | ORAL | Status: DC
  Administered 2015-08-08 – 2015-08-27 (×54): 1000 mg via ORAL
  Filled 2015-08-08 (×67): qty 2

## 2015-08-08 MED ORDER — TRACE MINERALS CR-CU-MN-SE-ZN 10-1000-500-60 MCG/ML IV SOLN
INTRAVENOUS | Status: AC
  Administered 2015-08-08: 17:00:00 via INTRAVENOUS
  Filled 2015-08-08: qty 1992

## 2015-08-08 MED ORDER — FAT EMULSION 20 % IV EMUL
240.0000 mL | INTRAVENOUS | Status: AC
  Administered 2015-08-08: 240 mL via INTRAVENOUS
  Filled 2015-08-08: qty 250

## 2015-08-08 NOTE — Progress Notes (Signed)
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   Abscess cavities much better decompressed  Reasonable to try a limited volume liquid diet. I am hesitant to advance her beyond that until she is further out.   Continue IV nutrition.  Hold off on any surgery acutely if possible. Seems less likely now. She would like to hold off on surgery as well as possible.  VTE prophylaxis- SCDs, etc  Mobilize as tolerated to help recovery  Adin Hector, M.D., F.A.C.S. Gastrointestinal and Minimally Invasive Surgery [facility] [address] [place], [place] [address] [phone] Main / Paging   08/08/2015  Subjective:  On floor Tol clears but doesn't like broth.  Wants fulls  Objective:  Vital signs:  Filed Vitals:   08/07/15 1207 08/07/15 1536 08/07/15 2359 08/08/15 0601  BP:  133/68 128/73 125/60  Pulse:  67  75  Temp: 98.5 F (36.9 C) 97.8 F (36.6 C) 99 F (37.2 C) 98.4 F (36.9 C)  TempSrc: Oral Oral Oral Oral  Resp:  '19 18 16  '$ Height:  5' (1.524 m)    Weight:  96.7 kg (213 lb 3 oz)    SpO2:  93% 100% 100%    Last BM Date: 08/06/15  Intake/Output   Yesterday:  03/03 0701 - 03/04 0700 In: 495 [P.O.:120; TPN:375] Out: 1000 [Urine:1000] This shift:     Bowel function:  Flatus: y  BM:  small  Drain: feculent  Physical Exam:  General: Pt awake/alert/oriented x4 in no acute distress.  Smiling/alert Eyes: PERRL, normal EOM.  Sclera clear.  No icterus Neuro: CN II-XII intact w/o focal sensory/motor deficits. Lymph: No head/neck/groin lymphadenopathy Psych:  No delerium/psychosis/paranoia HENT: Normocephalic, Mucus membranes moist.  No thrush Neck: Supple, No tracheal deviation Chest: No chest wall pain w good excursion CV:  Pulses intact.  Regular rhythm MS: Normal AROM mjr joints.  No obvious deformity Abdomen: Soft.  Nondistended.  Mildly tender at RLQ only.  No evidence of peritonitis.  No incarcerated hernias. Ext:  SCDs BLE.  No mjr edema.  No cyanosis Skin: No petechiae / purpura  Results:   Labs: Results for orders placed or performed during the hospital encounter of 07/30/15 (from the past 48 hour(s))  Glucose, capillary     Status: Abnormal   Collection Time: 08/06/15 11:54 AM  Result Value Ref Range   Glucose-Capillary 124 (H) 65 - 99 mg/dL  Glucose, capillary     Status: Abnormal   Collection Time: 08/06/15  6:29 PM  Result Value Ref Range   Glucose-Capillary 113 (H) 65 - 99 mg/dL  Glucose, capillary     Status: Abnormal   Collection Time: 08/06/15 11:39 PM  Result Value Ref Range   Glucose-Capillary 133 (H) 65 - 99 mg/dL  Glucose, capillary     Status: Abnormal  Collection Time: 08/07/15  5:35 AM  Result Value Ref Range   Glucose-Capillary 119 (H) 65 - 99 mg/dL  Glucose, capillary     Status: Abnormal   Collection Time: 08/07/15 11:45 AM  Result Value Ref Range   Glucose-Capillary 120 (H) 65 - 99 mg/dL  Glucose, capillary     Status: Abnormal   Collection Time: 08/07/15  6:28 PM  Result Value Ref Range   Glucose-Capillary 111 (H) 65 - 99 mg/dL  Glucose, capillary     Status: Abnormal   Collection Time: 08/07/15 11:55 PM  Result Value Ref Range   Glucose-Capillary 132 (H) 65 - 99 mg/dL  Glucose, capillary     Status: Abnormal   Collection  Time: 08/08/15  6:00 AM  Result Value Ref Range   Glucose-Capillary 141 (H) 65 - 99 mg/dL  Basic metabolic panel     Status: Abnormal   Collection Time: 08/08/15  6:20 AM  Result Value Ref Range   Sodium 136 135 - 145 mmol/L   Potassium 4.0 3.5 - 5.1 mmol/L   Chloride 104 101 - 111 mmol/L   CO2 25 22 - 32 mmol/L   Glucose, Bld 125 (H) 65 - 99 mg/dL   BUN 13 6 - 20 mg/dL   Creatinine, Ser 0.56 0.44 - 1.00 mg/dL   Calcium 8.4 (L) 8.9 - 10.3 mg/dL   GFR calc non Af Amer >60 >60 mL/min   GFR calc Af Amer >60 >60 mL/min    Comment: (NOTE) The eGFR has been calculated using the CKD EPI equation. This calculation has not been validated in all clinical situations. eGFR's persistently <60 mL/min signify possible Chronic Kidney Disease.    Anion gap 7 5 - 15  CBC     Status: Abnormal   Collection Time: 08/08/15  6:20 AM  Result Value Ref Range   WBC 8.8 4.0 - 10.5 K/uL   RBC 3.73 (L) 3.87 - 5.11 MIL/uL   Hemoglobin 10.2 (L) 12.0 - 15.0 g/dL   HCT 32.5 (L) 36.0 - 46.0 %   MCV 87.1 78.0 - 100.0 fL   MCH 27.3 26.0 - 34.0 pg   MCHC 31.4 30.0 - 36.0 g/dL   RDW 15.2 11.5 - 15.5 %   Platelets 469 (H) 150 - 400 K/uL  Magnesium     Status: None   Collection Time: 08/08/15  6:20 AM  Result Value Ref Range   Magnesium 2.2 1.7 - 2.4 mg/dL  Phosphorus     Status: None   Collection Time: 08/08/15  6:20 AM  Result Value Ref Range   Phosphorus 3.5 2.5 - 4.6 mg/dL    Imaging / Studies: No results found.  Medications / Allergies: per chart  Antibiotics: Anti-infectives    Start     Dose/Rate Route Frequency Ordered Stop   08/07/15 1400  piperacillin-tazobactam (ZOSYN) IVPB 3.375 g     3.375 g 12.5 mL/hr over 240 Minutes Intravenous 3 times per day 08/07/15 0905     08/07/15 0600  ciprofloxacin (CIPRO) tablet 500 mg  Status:  Discontinued     500 mg Oral 2 times daily 08/07/15 0541 08/07/15 0842   08/06/15 0830  fluconazole (DIFLUCAN) IVPB 100 mg  Status:  Discontinued     100 mg 50  mL/hr over 60 Minutes Intravenous Every 24 hours 08/06/15 0819 08/06/15 0846   08/05/15 1600  anidulafungin (ERAXIS) 100 mg in sodium chloride 0.9 % 100 mL IVPB     100 mg over 90 Minutes  Intravenous Every 24 hours 08/04/15 1509 08/07/15 2039   08/04/15 1600  anidulafungin (ERAXIS) 200 mg in sodium chloride 0.9 % 200 mL IVPB    Comments:  Pharmacy may adjust dosing strength, schedule, rate of infusion, etc as needed to optimize therapy   200 mg over 180 Minutes Intravenous  Once 08/04/15 1509 08/04/15 1928   07/30/15 1645  piperacillin-tazobactam (ZOSYN) IVPB 3.375 g  Status:  Discontinued     3.375 g 12.5 mL/hr over 240 Minutes Intravenous 3 times per day 07/30/15 1634 08/07/15 0541   07/30/15 1545  piperacillin-tazobactam (ZOSYN) IVPB 3.375 g     3.375 g 100 mL/hr over 30 Minutes Intravenous  Once 07/30/15 1541 07/30/15 1757        Note: Portions of this report may have been transcribed using voice recognition software. Every effort was made to ensure accuracy; however, inadvertent computerized transcription errors may be present.   Any transcriptional errors that result from this process are unintentional.     Adin Hector, M.D., F.A.C.S. Gastrointestinal and Minimally Invasive Surgery [facility] [address], [place], [place] [address] [phone] Main / Paging   08/08/2015  CARE TEAM:  PCP: Merrilee Seashore, MD  Outpatient Care Team: Patient Care Team: Merrilee Seashore, MD as PCP - General (Internal Medicine) Vivien Rossetti, MD as Consulting Physician (Oncology) Gordy Levan, MD as Consulting Physician (Oncology) Megan Salon, MD as Consulting Physician (Gynecology)  Inpatient Treatment Team: Treatment Team: Attending Provider: Kelvin Cellar, MD; Consulting Physician: Nolon Nations, MD; Consulting Physician: Gordy Levan, MD; Rounding Team: Redmond Baseman, MD; Registered Nurse: Lolita Rieger, RN; Technician:  Gardiner Ramus, NT; Registered Nurse: Barrington Ellison, RN

## 2015-08-08 NOTE — Progress Notes (Signed)
PARENTERAL NUTRITION CONSULT NOTE - INITIAL  Pharmacy Consult for TPN Indication: Perforated bowel; intolerant to enteral feeds  No Known Allergies  Patient Measurements: Height: 5' (152.4 cm) Weight: 213 lb 3 oz (96.7 kg) IBW/kg (Calculated) : 45.5 Adjusted Body Weight: 58.5 kg Usual Weight: ~98kg  Vital Signs: Temp: 98.4 F (36.9 C) (03/04 0601) Temp Source: Oral (03/04 0601) BP: 125/60 mmHg (03/04 0601) Pulse Rate: 75 (03/04 0601) Intake/Output from previous day: 03/03 0701 - 03/04 0700 In: 495 [P.O.:120; TPN:375] Out: 1000 [Urine:1000] Intake/Output from this shift:    Labs:  Recent Labs  08/06/15 0513 08/08/15 0620  WBC 10.7* 8.8  HGB 10.2* 10.2*  HCT 31.3* 32.5*  PLT 400 469*    Recent Labs  08/06/15 0513 08/08/15 0620  NA 137 136  K 3.5 4.0  CL 98* 104  CO2 31 25  GLUCOSE 127* 125*  BUN 8 13  CREATININE 0.52 0.56  CALCIUM 8.3* 8.4*  MG 1.9 2.2  PHOS 3.1 3.5  PROT 6.1*  --   ALBUMIN 2.5*  --   AST 11*  --   ALT 10*  --   ALKPHOS 50  --   BILITOT 1.4*  --   PREALBUMIN 6.4*  --   TRIG 188*  --    Estimated Creatinine Clearance: 77.9 mL/min (by C-G formula based on Cr of 0.56).    Recent Labs  08/07/15 1828 08/07/15 2355 08/08/15 0600  GLUCAP 111* 132* 141*   Medical History: Past Medical History  Diagnosis Date  . Abnormal uterine bleeding   . Anemia   . Fibroid   . Heart murmur     under 5 yrs of age  . Cancer ([facility]) dx'd 07/2014    endometrial stromal sarcoma  . Radiation 12/01/14-01/05/15    pelvis 45 gray  . Radiation 05/18/2015-06/05/15    T4 thoracic spine area 35 gray   Assessment: 60 yo F admitted 2/23 with abdominal pain and noted to have perforated bowel.  PMH significant for endometrial stromal sarcoma with bilateral pelvic node involvement, diverticulosis, gastric sleeve at [facility] and hysterectomy and oophorectomy at [facility] in 2016.  She has been on Zosyn since admission for IAI and Eraxis was added  2/28.   Clear liquid diet was attempted 2/25 however patient developed severe abdominal pain with nausea/vomiting.    Insulin Requirements during previous day: 2 units  Current Nutrition: dysphagia diet  IVF: none  Central access: implanted port 11/19/14 TPN start date: 08/05/15  ASSESSMENT                                                                                                          HPI: 60 yo F admitted 2/23 with abdominal pain and noted to have perforated bowel.  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 She has been on Zosyn since admission for IAI and Eraxis was added 2/28.   Clear liquid diet was attempted 2/25  however patient developed severe abdominal pain with nausea/vomiting.    Significant events:  3/1: IR consulted for drain adjustment (bulb changed) 3/3: TPN advanced to goal rate 3/4: abscess drainage decresed, 75 ml/24 hr  Today: No new labs; 3/2 labs below  Glucose - WNL.  No hx DM.  Electrolytes - Cl low, but improved (98), CorrCa wnl, all other electrolytes wnl.    Renal - stable at patient's baseline.  Estimated CrCl ~ 75-80 ml/min (CG).  24 Net I/O -34ml.  LFTs - TBili elevated but trending down (1.4).  Patient does not appear jaundiced.   TGs mildly elevated (188)  Prealbumin -low (6.4)  NUTRITIONAL GOALS                                                                                             Estimated Goals: 1750-2050  kCal/d, 90-120 Gm/d (1.2-2 Gm/Kg/day using AdjBW); >1.5L Fluid/day Clinimix E 5/15 at a goal rate of 83 ml/hr + 20% fat emulsion at 10 ml/hr to provide: 100g/day protein, 1894 Kcal/day.  PLAN                                                                                                                         At 1800 today:  Continue Clinimix E 5/15 at 83 ml/hr.  20%  fat emulsion at 10 ml/hr.  TPN to contain standard multivitamins and trace elements.  CBG checks q6h with moderate correction scale if needed  TPN lab panels on Mondays & Thursdays.  F/u daily.  Minda Ditto PharmD Pager [phone] 08/08/2015, 11:16 AM

## 2015-08-08 NOTE — Progress Notes (Signed)
[facility] PROGRESS NOTE  Erin [facility] [id number] DOB: 04/06/1956 DOA: 07/30/2015 PCP: Erin Seashore, MD  Brief interval history  Erin [facility] is a 60 y.o. 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 Patient is status post chemotherapy and radiation to the pelvis and back and was due to start chemotherapy on the day of admission due to progression of disease involving the left abdominal pain. Patient had presented to the ED with complaints of 1-2 day history of diffuse abdominal pain. Patient stated that she woke up around 2 AM one day prior to admission with significant diffuse abdominal pain and when she stood up to go through her bathroom she lost control of her bladder due to probable incontinence. Patient stated that the whole day prior to admission was lethargic and very sleepy and slept all day. Patient does endorse subjective fevers, chills, nausea, emesis, generalized weakness, diarrhea. Patient also endorses some hematochezia. Patient denies chest pain, no constipation, no dysuria, no hematemesis, no melena. Patient does endorse an episode of hematemesis. Patient denies any cough. Patient was seen in the emergency room comprehensive metabolic profile done at a sodium of 133 bicarbonate of 19 BUN of 29 creatinine of 1.06 glucose of 125 AST of 13 ALT of 23 protein of 7.5 bilirubin of 3.4 otherwise is within normal limits. Initial lactic acid level is elevated at 2.67 and repeat lactic acid level was down to 0.93. CBC had a white count of 11.1 with no other abnormalities. Urinalysis pending. Acute abdominal series showed dilated small bowel loops containing residual contrast in the midabdomen most likely represent a component of small bowel obstruction. Bibasilar atelectasis. CT abdomen and pelvis did show bowel perforation.  Large 9 x 4.5 cm complex right lower quadrant and pelvic abscess containing a small amount of contrast suggesting communication with the distal ileum. This could be due to tumor erosion. Stable adenopathy in the abdomen and pelvis. Patient was seen in consultation by general surgery who recommended initially nonoperative percutaneous drainage and admission per hospitalist.  Patient was admitted to the stepdown unit general surgery was consulted patient was placed on bowel rest and monitored. Interventional radiology was also consulted for drain placement. Patient had a percutaneous drain placed on 07/31/2015 and was placed empirically on IV Zosyn from admission. Wound/abscess cultures grew out Escherichia coli. Patient was monitored. Patient's pain was controlled on pain medication. Oncology also assess the patient. Patient was started on clear liquids and as diet was advanced patient was noted to have abdominal pain with a fever and emesis. Patient's diet has subsequently been changed back to clear liquids. Repeat CT abdomen and pelvis pending for 08/04/2015. Will keep an stepdown unit. If patient deteriorates will likely need emergency exploratory laparotomy.      Assessment/Plan: #1 bowel perforation, complex intra-abdominal fluid collection 9 x 4.5 cm right lower quadrant and pelvic Questionable etiology. Patient with fever overnight with emesis when she tried oral intake of food. Patient states no significant improvement with abdominal pain since yesterday 08/03/2015. Possible invasion of cancer/ metastatic endometrial cancer into bowel. Per CT abdomen and pelvis looks like perforation may have been walled off and contained. Blood cultures pending. Patient currently afebrile. Leukocytosis trending down. Continue empiric IV Zosyn. Patient has been seen in consultation by general surgery will recommending nonoperative percutaneous drainage placement. S/p RLQ percutaneous drain 07/31/2015 per IR.   Wound/abscess cultures growing Escherichia coli.  -  Repeat CT scan of abdomen and pelvis performed on 07/27/2015 show a decrease in the size of fluid collection in the lateral portion however more medially it's larger. Radios reported that the collection extending from the right lower quadrant into the posterior pelvic wheezes has also increased in size. This contains contrast. -Gen. surgery and interventional radiology following. -She has been afebrile for the past 24 hours -Blood cultures obtained on 07/30/2015 show no growth 2  -Gen. surgery OK with limited volume liquid diet.  -Plan for follow up CT in 1 week   #2 history of iron deficiency anemia -Stable, lab work on 08/06/2015 showed hemoglobin of 10.2.   #3 dehydration/metabolic acidosis -Resolving. She was treated with IV fluids.   #4 hypokalemia -Potassium improved to 3.5 from 3.0 received IV potassium on 08/05/2015  #5 lactic acidosis Likely secondary to problem #1 and dehydration. Lactic acid levels trending down. Patient has been pancultured. Abscess growing Nelson which is pan sensitive. Continue empiric IV Zosyn.  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 Patient status post chemotherapy and radiation to the pelvis. Patient was to start chemotherapy on the day of admission due to progression of disease involving the left, iliac adenopathy, 2 masses in the right lower quadrant seen on CT scan on 07/21/2015.  Oncology following.  #7 prophylaxis SCDs for DVT prophylaxis.   Code Status: Full Family Communication: Updated patient. No family at bedside. Disposition Plan: PT consult anticipate d/c soon, per surgery   Consultants:  Gen. surgery: Dr. Excell Seltzer 07/30/2015   oncology: Dr. Marko Plume 07/31/2015  Interventional radiology  Procedures:  CT abdomen and pelvis 07/30/2015  Acute  abdominal series 07/30/2015  Right lower quadrant abdominal abscess drain with CT 07/31/2015 for Dr. Annamaria Boots  Antibiotics:  IV Zosyn 07/30/2015  HPI/Subjective: Sitting at bedside chair, states feeling much better today, wanted a full liquid diet.   Objective: Filed Vitals:   08/07/15 2359 08/08/15 0601  BP: 128/73 125/60  Pulse:  75  Temp: 99 F (37.2 C) 98.4 F (36.9 C)  Resp: 18 16    Intake/Output Summary (Last 24 hours) at 08/08/15 1336 Last data filed at 08/07/15 1700  Gross per 24 hour  Intake    120 ml  Output    250 ml  Net   -130 ml   Filed Weights   08/06/15 0500 08/07/15 0500 08/07/15 1536  Weight: 97.2 kg (214 lb 4.6 oz) 97.07 kg (214 lb) 96.7 kg (213 lb 3 oz)    Exam:   General:  NAD, sitting at bedside chair, states feeling better. She looks better.  Cardiovascular: RRR  Respiratory: Bibasilar crackles  Abdomen: Soft, nontender, nondistended. JP drain in place  Musculoskeletal: No clubbing cyanosis or edema.   Data Reviewed: Basic Metabolic Panel:  Recent Labs Lab 08/02/15 0430 08/03/15 0425 08/04/15 [id number] 08/05/15 0506 08/05/15 0511 08/06/15 0513 08/08/15 0620  NA 140 137 139 135  --  137 136  K 3.2* 4.4 4.1 3.0*  --  3.5 4.0  CL 104 103 104 93*  --  98* 104  CO2 26 25 26 27   --  31 25  GLUCOSE 89 82 91 77  --  127* 125*  BUN 11 8 6 6   --  8 13  CREATININE 0.70 0.60 0.50 0.80  --  0.52 0.56  CALCIUM 8.1* 7.7* 8.2* 8.1*  --  8.3* 8.4*  MG 1.7 2.0  --  1.8  --  1.9 2.2  PHOS  --   --   --   --  4.4 3.1 3.5   Liver Function Tests:  Recent Labs Lab 08/03/15 0425 08/05/15 0506 08/06/15 0513  AST 11* 11* 11*  ALT 8* 10* 10*  ALKPHOS 52 48 50  BILITOT 2.8* 2.6* 1.4*  PROT 5.4* 6.2* 6.1*  ALBUMIN 2.3* 2.5* 2.5*   No results for input(s): LIPASE, AMYLASE in the last 168 hours. No results for input(s): AMMONIA in the last 168 hours. CBC:  Recent Labs Lab 08/01/15 2050  08/03/15 0425 08/04/15 0508 08/05/15 0506  08/06/15 0513 08/08/15 0620  WBC 10.4  < > 8.7 9.6 11.4* 10.7* 8.8  NEUTROABS 8.9*  --   --  7.3 9.0* 8.7*  --   HGB 9.8*  < > 9.9* 10.0* 10.1* 10.2* 10.2*  HCT 29.8*  < > 29.8* 30.0* 30.9* 31.3* 32.5*  MCV 85.1  < > 86.1 86.5 84.4 85.1 87.1  PLT 225  < > 262 301 356 400 469*  < > = values in this interval not displayed. Cardiac Enzymes: No results for input(s): CKTOTAL, CKMB, CKMBINDEX, TROPONINI in the last 168 hours. BNP (last 3 results) No results for input(s): BNP in the last 8760 hours.  ProBNP (last 3 results) No results for input(s): PROBNP in the last 8760 hours.  CBG:  Recent Labs Lab 08/07/15 0535 08/07/15 1145 08/07/15 1828 08/07/15 2355 08/08/15 0600  GLUCAP 119* 120* 111* 132* 141*    Recent Results (from the past 240 hour(s))  Blood culture (routine x 2)     Status: None   Collection Time: 07/30/15  6:31 PM  Result Value Ref Range Status   Specimen Description BLOOD RIGHT PORT  Final   Special Requests BOTTLES DRAWN AEROBIC AND ANAEROBIC 5CC EACH  Final   Culture   Final    NO GROWTH 5 DAYS Performed at [facility]    Report Status 08/04/2015 FINAL  Final  Blood culture (routine x 2)     Status: None   Collection Time: 07/30/15  6:33 PM  Result Value Ref Range Status   Specimen Description BLOOD LEFT WRIST  Final   Special Requests BOTTLES DRAWN AEROBIC AND ANAEROBIC 5CC EACH  Final   Culture   Final    NO GROWTH 5 DAYS Performed at [facility]    Report Status 08/04/2015 FINAL  Final  Culture, Urine     Status: None   Collection Time: 07/30/15  7:36 PM  Result Value Ref Range Status   Specimen Description URINE, RANDOM  Final   Special Requests NONE  Final   Culture   Final    3,000 COLONIES/mL INSIGNIFICANT GROWTH Performed at [place]    Report Status 07/31/2015 FINAL  Final  MRSA PCR Screening     Status: None   Collection Time: 07/30/15  8:11 PM  Result Value Ref Range Status   MRSA by PCR NEGATIVE NEGATIVE  Final    Comment:        The GeneXpert MRSA Assay (FDA approved for NASAL specimens only), is one component of a comprehensive MRSA colonization surveillance program. It is not intended to diagnose MRSA infection nor to guide or monitor treatment for MRSA infections.   Culture, routine-abscess     Status: None   Collection Time: 07/31/15  3:51 PM  Result Value Ref Range Status   Specimen Description ABSCESS RIGHT LOWER ABDOMINAL QUADRANT  Final  Special Requests Normal  Final   Gram Stain   Final    FEW WBC PRESENT,BOTH PMN AND MONONUCLEAR NO SQUAMOUS EPITHELIAL CELLS SEEN FEW GRAM NEGATIVE RODS Performed at [facility]    Culture   Final    MODERATE ESCHERICHIA COLI Performed at [facility]    Report Status 08/03/2015 FINAL  Final   Organism ID, Bacteria ESCHERICHIA COLI  Final      Susceptibility   Escherichia coli - MIC*    AMPICILLIN <=2 SENSITIVE Sensitive     AMPICILLIN/SULBACTAM <=2 SENSITIVE Sensitive     CEFEPIME <=1 SENSITIVE Sensitive     CEFTAZIDIME <=1 SENSITIVE Sensitive     CEFTRIAXONE <=1 SENSITIVE Sensitive     CIPROFLOXACIN <=0.25 SENSITIVE Sensitive     GENTAMICIN <=1 SENSITIVE Sensitive     IMIPENEM <=0.25 SENSITIVE Sensitive     PIP/TAZO <=4 SENSITIVE Sensitive     TOBRAMYCIN <=1 SENSITIVE Sensitive     TRIMETH/SULFA Value in next row Sensitive      <=20 SENSITIVE(NOTE)    * MODERATE ESCHERICHIA COLI  Anaerobic culture     Status: None   Collection Time: 07/31/15  3:51 PM  Result Value Ref Range Status   Specimen Description ABSCESS RIGHT LOWER ABDOMINAL QUADRANT  Final   Special Requests Normal  Final   Gram Stain   Final    FEW WBC PRESENT,BOTH PMN AND MONONUCLEAR NO SQUAMOUS EPITHELIAL CELLS SEEN FEW GRAM NEGATIVE RODS Performed at [facility]    Culture   Final    BACTEROIDES VULGATUS Note: BETA LACTAMASE POSITIVE Performed at [facility]    Report Status 08/05/2015 FINAL  Final  Culture, Urine      Status: None   Collection Time: 08/04/15 12:24 PM  Result Value Ref Range Status   Specimen Description URINE, RANDOM  Final   Special Requests NONE  Final   Culture   Final    NO GROWTH 1 DAY Performed at [facility]    Report Status 08/05/2015 FINAL  Final  Body fluid culture     Status: None (Preliminary result)   Collection Time: 08/04/15  6:30 PM  Result Value Ref Range Status   Specimen Description ABDOMEN PERFORATED BOWEL  Final   Special Requests NONE  Final   Gram Stain   Final    RARE WBC PRESENT, PREDOMINANTLY MONONUCLEAR GRAM NEGATIVE RODS GRAM POSITIVE COCCI IN CHAINS IN PAIRS GRAM POSITIVE RODS    Culture   Final    MULTIPLE ORGANISMS PRESENT, NONE PREDOMINANT Performed at [facility]    Report Status PENDING  Incomplete     Studies: No results found.  Scheduled Meds: . acetaminophen  1,000 mg Oral TID  . antiseptic oral rinse  7 mL Mouth Rinse BID  . insulin aspart  0-15 Units Subcutaneous 4 times per day  . nystatin   Topical BID  . piperacillin-tazobactam (ZOSYN)  IV  3.375 g Intravenous 3 times per day  . sodium chloride flush  3 mL Intravenous Q12H   Continuous Infusions: . Marland KitchenTPN (CLINIMIX-E) Adult 83 mL/hr at 08/07/15 1721   And  . fat emulsion 240 mL (08/07/15 1722)  . Marland KitchenTPN (CLINIMIX-E) Adult     And  . 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 If 7PM-7AM, please contact night-coverage at [email or web address], password [email or web address] 08/08/2015, 1:36 PM  LOS: 9 days

## 2015-08-09 DIAGNOSIS — E440 Moderate protein-calorie malnutrition: Secondary | ICD-10-CM

## 2015-08-09 LAB — BODY FLUID CULTURE

## 2015-08-09 LAB — GLUCOSE, CAPILLARY
GLUCOSE-CAPILLARY: 120 mg/dL — AB (ref 65–99)
GLUCOSE-CAPILLARY: 128 mg/dL — AB (ref 65–99)
Glucose-Capillary: 113 mg/dL — ABNORMAL HIGH (ref 65–99)
Glucose-Capillary: 106 mg/dL — ABNORMAL HIGH (ref 65–99)

## 2015-08-09 MED ORDER — FAT EMULSION 20 % IV EMUL
240.0000 mL | INTRAVENOUS | Status: AC
  Administered 2015-08-09: 240 mL via INTRAVENOUS
  Filled 2015-08-09: qty 250

## 2015-08-09 MED ORDER — TRACE MINERALS CR-CU-MN-SE-ZN 10-1000-500-60 MCG/ML IV SOLN
INTRAVENOUS | Status: AC
  Administered 2015-08-09: 17:00:00 via INTRAVENOUS
  Filled 2015-08-09: qty 1992

## 2015-08-09 NOTE — Progress Notes (Signed)
[facility] PROGRESS NOTE  Erin Santiago [id number] DOB: 28-May-1956 DOA: 07/30/2015 PCP: Merrilee Seashore, MD  Brief interval history  Erin Santiago is a 60 y.o. 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 Patient is status post chemotherapy and radiation to the pelvis and back and was due to start chemotherapy on the day of admission due to progression of disease involving the left abdominal pain. Patient had presented to the ED with complaints of 1-2 day history of diffuse abdominal pain. Patient stated that she woke up around 2 AM one day prior to admission with significant diffuse abdominal pain and when she stood up to go through her bathroom she lost control of her bladder due to probable incontinence. Patient stated that the whole day prior to admission was lethargic and very sleepy and slept all day. Patient does endorse subjective fevers, chills, nausea, emesis, generalized weakness, diarrhea. Patient also endorses some hematochezia. Patient denies chest pain, no constipation, no dysuria, no hematemesis, no melena. Patient does endorse an episode of hematemesis. Patient denies any cough. Patient was seen in the emergency room comprehensive metabolic profile done at a sodium of 133 bicarbonate of 19 BUN of 29 creatinine of 1.06 glucose of 125 AST of 13 ALT of 23 protein of 7.5 bilirubin of 3.4 otherwise is within normal limits. Initial lactic acid level is elevated at 2.67 and repeat lactic acid level was down to 0.93. CBC had a white count of 11.1 with no other abnormalities. Urinalysis pending. Acute abdominal series showed dilated small bowel loops containing residual contrast in the midabdomen most likely represent a component of small bowel obstruction. Bibasilar atelectasis. CT abdomen and pelvis did show bowel perforation.  Large 9 x 4.5 cm complex right lower quadrant and pelvic abscess containing a small amount of contrast suggesting communication with the distal ileum. This could be due to tumor erosion. Stable adenopathy in the abdomen and pelvis. Patient was seen in consultation by general surgery who recommended initially nonoperative percutaneous drainage and admission per hospitalist.  Patient was admitted to the stepdown unit general surgery was consulted patient was placed on bowel rest and monitored. Interventional radiology was also consulted for drain placement. Patient had a percutaneous drain placed on 07/31/2015 and was placed empirically on IV Zosyn from admission. Wound/abscess cultures grew out Escherichia coli. Patient was monitored. Patient's pain was controlled on pain medication. Oncology also assess the patient. Patient was started on clear liquids and as diet was advanced patient was noted to have abdominal pain with a fever and emesis. Patient's diet has subsequently been changed back to clear liquids. Repeat CT abdomen and pelvis pending for 08/04/2015. Will keep an stepdown unit. If patient deteriorates will likely need emergency exploratory laparotomy.      Assessment/Plan: #1 bowel perforation, complex intra-abdominal fluid collection 9 x 4.5 cm right lower quadrant and pelvic Questionable etiology. Patient with fever overnight with emesis when she tried oral intake of food. Patient states no significant improvement with abdominal pain since yesterday 08/03/2015. Possible invasion of cancer/ metastatic endometrial cancer into bowel. Per CT abdomen and pelvis looks like perforation may have been walled off and contained. Blood cultures pending. Patient currently afebrile. Leukocytosis trending down.  Patient has been seen in consultation by general surgery will recommending nonoperative percutaneous drainage placement. S/p RLQ percutaneous drain 07/31/2015 per IR.  Wound/abscess cultures growing  Escherichia coli, this organism was susceptible.  She has been on IV Zosyn.  -Gen. surgery and interventional radiology following. -Blood cultures obtained on 07/30/2015 show no growth 2  -Gen. surgery OK with soft diet, hold off on solids for now. Recommending repeating a CT scan of abdomen and pelvis on Monday or Tuesday of next week. Remains on IV Zosyn.  #2 history of iron deficiency anemia -Stable, lab work on 08/06/2015 showed hemoglobin of 10.2.   #3 dehydration/metabolic acidosis -Resolving. She was treated with IV fluids.   #4 hypokalemia -Potassium improved to 3.5 from 3.0 received IV potassium on 08/05/2015  #5 lactic acidosis Likely secondary to problem #1 and dehydration. Lactic acid levels trending down. Patient has been pancultured. Abscess growing [place] which is pan sensitive. Continue empiric IV Zosyn.  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 Patient status post chemotherapy and radiation to the pelvis. Patient was to start chemotherapy on the day of admission due to progression of disease involving the left, iliac adenopathy, 2 masses in the right lower quadrant seen on CT scan on 07/21/2015.  Oncology following.  #7 prophylaxis SCDs for DVT prophylaxis.   Code Status: Full Family Communication: Updated patient. No family at bedside. Disposition Plan: Plan to repeat CT scan of abdomen and pelvis in the next 24-48 hours   Consultants:  Gen. surgery: Dr. Excell Seltzer 07/30/2015   oncology: Dr. Marko Plume 07/31/2015  Interventional radiology  Procedures:  CT abdomen and pelvis 07/30/2015  Acute abdominal series 07/30/2015  Right lower quadrant abdominal abscess drain with CT 07/31/2015 for Dr. Annamaria Boots  Antibiotics:  IV Zosyn 07/30/2015  HPI/Subjective: Patient reporting doing much better, seems to be tolerating soft  diet.  Objective: Filed Vitals:   08/08/15 2238 08/09/15 0646  BP: 121/57 119/71  Pulse: 82 83  Temp: 99.3 F (37.4 C) 99.1 F (37.3 C)  Resp: 16 16    Intake/Output Summary (Last 24 hours) at 08/09/15 1345 Last data filed at 08/09/15 0700  Gross per 24 hour  Intake   1386 ml  Output    235 ml  Net   1151 ml   Filed Weights   08/06/15 0500 08/07/15 0500 08/07/15 1536  Weight: 97.2 kg (214 lb 4.6 oz) 97.07 kg (214 lb) 96.7 kg (213 lb 3 oz)    Exam:   General:  NAD, sitting at bedside chair, states feeling better. She looks better.  Cardiovascular: RRR  Respiratory: Normal respiratory effort, lungs are clear to auscultation bilaterally  Abdomen: Soft, nontender, nondistended. JP drain in place  Musculoskeletal: No clubbing cyanosis or edema.   Data Reviewed: Basic Metabolic Panel:  Recent Labs Lab 08/03/15 0425 08/04/15 [id number] 08/05/15 0506 08/05/15 0511 08/06/15 0513 08/08/15 0620  NA 137 139 135  --  137 136  K 4.4 4.1 3.0*  --  3.5 4.0  CL 103 104 93*  --  98* 104  CO2 25 26 27   --  31 25  GLUCOSE 82 91 77  --  127* 125*  BUN 8 6 6   --  8 13  CREATININE 0.60 0.50 0.80  --  0.52 0.56  CALCIUM 7.7* 8.2* 8.1*  --  8.3* 8.4*  MG 2.0  --  1.8  --  1.9 2.2  PHOS  --   --   --  4.4 3.1 3.5   Liver Function Tests:  Recent Labs Lab 08/03/15 0425 08/05/15 0506 08/06/15 0513  AST 11* 11* 11*  ALT 8* 10* 10*  ALKPHOS 52 48 50  BILITOT 2.8* 2.6* 1.4*  PROT 5.4* 6.2* 6.1*  ALBUMIN 2.3* 2.5* 2.5*   No results for input(s): LIPASE, AMYLASE in the last 168 hours. No results for input(s): AMMONIA in the last 168 hours. CBC:  Recent Labs Lab 08/03/15 0425 08/04/15 0508 08/05/15 0506 08/06/15 0513 08/08/15 0620  WBC 8.7 9.6 11.4* 10.7* 8.8  NEUTROABS  --  7.3 9.0* 8.7*  --   HGB 9.9* 10.0* 10.1* 10.2* 10.2*  HCT 29.8* 30.0* 30.9* 31.3* 32.5*  MCV 86.1 86.5 84.4 85.1 87.1  PLT 262 301 356 400 469*   Cardiac Enzymes: No results for input(s):  CKTOTAL, CKMB, CKMBINDEX, TROPONINI in the last 168 hours. BNP (last 3 results) No results for input(s): BNP in the last 8760 hours.  ProBNP (last 3 results) No results for input(s): PROBNP in the last 8760 hours.  CBG:  Recent Labs Lab 08/08/15 1400 08/08/15 1822 08/09/15 0015 08/09/15 0624 08/09/15 1152  GLUCAP 110* 112* 128* 113* 120*    Recent Results (from the past 240 hour(s))  Blood culture (routine x 2)     Status: None   Collection Time: 07/30/15  6:31 PM  Result Value Ref Range Status   Specimen Description BLOOD RIGHT PORT  Final   Special Requests BOTTLES DRAWN AEROBIC AND ANAEROBIC 5CC EACH  Final   Culture   Final    NO GROWTH 5 DAYS Performed at [facility]    Report Status 08/04/2015 FINAL  Final  Blood culture (routine x 2)     Status: None   Collection Time: 07/30/15  6:33 PM  Result Value Ref Range Status   Specimen Description BLOOD LEFT WRIST  Final   Special Requests BOTTLES DRAWN AEROBIC AND ANAEROBIC 5CC EACH  Final   Culture   Final    NO GROWTH 5 DAYS Performed at [facility]    Report Status 08/04/2015 FINAL  Final  Culture, Urine     Status: None   Collection Time: 07/30/15  7:36 PM  Result Value Ref Range Status   Specimen Description URINE, RANDOM  Final   Special Requests NONE  Final   Culture   Final    3,000 COLONIES/mL INSIGNIFICANT GROWTH Performed at [facility]    Report Status 07/31/2015 FINAL  Final  MRSA PCR Screening     Status: None   Collection Time: 07/30/15  8:11 PM  Result Value Ref Range Status   MRSA by PCR NEGATIVE NEGATIVE Final    Comment:        The GeneXpert MRSA Assay (FDA approved for NASAL specimens only), is one component of a comprehensive MRSA colonization surveillance program. It is not intended to diagnose MRSA infection nor to guide or monitor treatment for MRSA infections.   Culture, routine-abscess     Status: None   Collection Time: 07/31/15  3:51 PM  Result  Value Ref Range Status   Specimen Description ABSCESS RIGHT LOWER ABDOMINAL QUADRANT  Final   Special Requests Normal  Final   Gram Stain   Final    FEW WBC PRESENT,BOTH PMN AND MONONUCLEAR NO SQUAMOUS EPITHELIAL CELLS SEEN FEW GRAM NEGATIVE RODS Performed at [facility]    Culture   Final    MODERATE ESCHERICHIA COLI Performed at [facility]    Report Status 08/03/2015 FINAL  Final   Organism ID, Bacteria ESCHERICHIA COLI  Final      Susceptibility  Escherichia coli - MIC*    AMPICILLIN <=2 SENSITIVE Sensitive     AMPICILLIN/SULBACTAM <=2 SENSITIVE Sensitive     CEFEPIME <=1 SENSITIVE Sensitive     CEFTAZIDIME <=1 SENSITIVE Sensitive     CEFTRIAXONE <=1 SENSITIVE Sensitive     CIPROFLOXACIN <=0.25 SENSITIVE Sensitive     GENTAMICIN <=1 SENSITIVE Sensitive     IMIPENEM <=0.25 SENSITIVE Sensitive     PIP/TAZO <=4 SENSITIVE Sensitive     TOBRAMYCIN <=1 SENSITIVE Sensitive     TRIMETH/SULFA Value in next row Sensitive      <=20 SENSITIVE(NOTE)    * MODERATE ESCHERICHIA COLI  Anaerobic culture     Status: None   Collection Time: 07/31/15  3:51 PM  Result Value Ref Range Status   Specimen Description ABSCESS RIGHT LOWER ABDOMINAL QUADRANT  Final   Special Requests Normal  Final   Gram Stain   Final    FEW WBC PRESENT,BOTH PMN AND MONONUCLEAR NO SQUAMOUS EPITHELIAL CELLS SEEN FEW GRAM NEGATIVE RODS Performed at [facility]    Culture   Final    BACTEROIDES VULGATUS Note: BETA LACTAMASE POSITIVE Performed at [facility]    Report Status 08/05/2015 FINAL  Final  Culture, Urine     Status: None   Collection Time: 08/04/15 12:24 PM  Result Value Ref Range Status   Specimen Description URINE, RANDOM  Final   Special Requests NONE  Final   Culture   Final    NO GROWTH 1 DAY Performed at [facility]    Report Status 08/05/2015 FINAL  Final  Body fluid culture     Status: None   Collection Time: 08/04/15  6:30 PM  Result Value  Ref Range Status   Specimen Description ABDOMEN PERFORATED BOWEL  Final   Special Requests NONE  Final   Gram Stain   Final    RARE WBC PRESENT, PREDOMINANTLY MONONUCLEAR GRAM NEGATIVE RODS GRAM POSITIVE COCCI IN CHAINS IN PAIRS GRAM POSITIVE RODS    Culture   Final    MULTIPLE ORGANISMS PRESENT, NONE PREDOMINANT Performed at [facility]    Report Status 08/09/2015 FINAL  Final     Studies: No results found.  Scheduled Meds: . acetaminophen  1,000 mg Oral TID  . antiseptic oral rinse  7 mL Mouth Rinse BID  . insulin aspart  0-15 Units Subcutaneous 4 times per day  . nystatin   Topical BID  . piperacillin-tazobactam (ZOSYN)  IV  3.375 g Intravenous 3 times per day  . sodium chloride flush  3 mL Intravenous Q12H   Continuous Infusions: . Marland KitchenTPN (CLINIMIX-E) Adult 83 mL/hr at 08/08/15 1710   And  . fat emulsion 240 mL (08/08/15 1710)  . Marland KitchenTPN (CLINIMIX-E) Adult     And  . 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 If 7PM-7AM, please contact night-coverage at [email or web address], password [facility] 08/09/2015, 1:45 PM  LOS: 10 days

## 2015-08-09 NOTE — Progress Notes (Signed)
PARENTERAL NUTRITION CONSULT NOTE - INITIAL  Pharmacy Consult for TPN Indication: Perforated bowel; intolerant to enteral feeds  No Known Allergies  Patient Measurements: Height: 5' (152.4 cm) Weight: 213 lb 3 oz (96.7 kg) IBW/kg (Calculated) : 45.5 Adjusted Body Weight: 58.5 kg Usual Weight: ~98kg  Vital Signs: Temp: 99.1 F (37.3 C) (03/05 0646) Temp Source: Oral (03/05 0646) BP: 119/71 mmHg (03/05 0646) Pulse Rate: 83 (03/05 0646) Intake/Output from previous day: 03/04 0701 - 03/05 0700 In: 1386 [P.O.:240; TPN:1116] Out: 235 [Drains:235] Intake/Output from this shift:    Labs:  Recent Labs  08/08/15 0620  WBC 8.8  HGB 10.2*  HCT 32.5*  PLT 469*    Recent Labs  08/08/15 0620  NA 136  K 4.0  CL 104  CO2 25  GLUCOSE 125*  BUN 13  CREATININE 0.56  CALCIUM 8.4*  MG 2.2  PHOS 3.5   Estimated Creatinine Clearance: 77.9 mL/min (by C-G formula based on Cr of 0.56).    Recent Labs  08/08/15 1822 08/09/15 0015 08/09/15 0624  GLUCAP 112* 128* 113*   Medical History: Past Medical History  Diagnosis Date  . Abnormal uterine bleeding   . Anemia   . Fibroid   . Heart murmur     under 5 yrs of age  . Cancer ([place]) dx'd 07/2014    endometrial stromal sarcoma  . Radiation 12/01/14-01/05/15    pelvis 45 gray  . Radiation 05/18/2015-06/05/15    T4 thoracic spine area 35 gray   Assessment: 60 yo F admitted 2/23 with abdominal pain and noted to have perforated bowel.  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  She has been on Zosyn since admission for IAI and Eraxis was added 2/28.   Clear liquid diet was attempted 2/25 however patient developed severe abdominal pain with nausea/vomiting.    Insulin Requirements during previous day: 6 units, CBG's increased with po intake  Current Nutrition: dysphagia  diet  IVF: none  Central access: implanted port 11/19/14 TPN start date: 08/05/15  ASSESSMENT                                                                                                          HPI: 60 yo F admitted 2/23 with abdominal pain and noted to have perforated bowel.  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 in 2016. She has been on Zosyn since admission for IAI and Eraxis was added 2/28.   Clear liquid diet was attempted 2/25 however patient developed severe abdominal pain with nausea/vomiting.    Significant events:  3/1: IR consulted for drain adjustment (bulb changed) 3/3: TPN advanced to goal rate 3/5: abscess drainage decreased, 85 ml/24 hr, + BM's  Today: No new labs; 3/2 labs below  Glucose - increased with po intake.  No hx DM.  Electrolytes - wnl, Corr  Ca 9.6  Renal - stable at patient's baseline. UOP not measured  LFTs - TBili elevated but trending down (1.4).  Patient does not appear jaundiced.   TGs mildly elevated 188 (3/2)  Prealbumin -low 6.4 (3/2)  NUTRITIONAL GOALS                                                                                             Estimated Goals: 1750-2050  kCal/d, 90-120 Gm/d (1.2-2 Gm/Kg/day using AdjBW); >1.5L Fluid/day Clinimix E 5/15 at a goal rate of 83 ml/hr + 20% fat emulsion at 10 ml/hr to provide: 100g/day protein, 1894 Kcal/day.  PLAN                                                                                                                         At 1800 today:  Continue Clinimix E 5/15 at 83 ml/hr.  20% fat emulsion at 10 ml/hr.  TPN to contain standard multivitamins and trace elements.  CBG checks q6h with moderate correction scale   TPN lab panels on Mondays & Thursdays.  F/u daily.  Patient states she is not hungry, consider cyclic regimen if  TPN to be continued?  Minda Ditto PharmD Pager [phone] 08/09/2015, 10:42 AM

## 2015-08-09 NOTE — Progress Notes (Signed)
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   Reasonable to try a limited volume liquid diet. No decline on..  However has not to advance to solids until she is further out.   Continue IV nutrition for moderate to severe malnutrition.  Consider repeat CT scan Monday/Tuesday with drain study to see if abscess improved.  If pain down and abscesses better controlled with drain on follow-up study, consider calorie counts and see if can wean off TNA if having adequate oral intake on pured.  Supplemental shakes.  I will otherwise may need to stay on TNA another week or so until stronger.  Hold off on any surgery acutely if possible. Seems less likely now. She would like to hold off on surgery as well as possible.  Would defer any possible resection later on down the line with her surgical oncologist.  At [facility]  VTE prophylaxis- SCDs, etc  Mobilize as tolerated to help recovery  Adin Hector, M.D., F.A.C.S. Gastrointestinal and Minimally Invasive Surgery [facility] [address] [place] [address] [place], [place] [address] [phone] Main / Paging   08/09/2015  Subjective:  On floor Tol fulls  Objective:  Vital signs:  Filed Vitals:   08/08/15 0601 08/08/15 1457 08/08/15 2238 08/09/15 0646  BP: 125/60 113/72 121/57 119/71  Pulse: 75 78 82  83  Temp: 98.4 F (36.9 C) 98.5 F (36.9 C) 99.3 F (37.4 C) 99.1 F (37.3 C)  TempSrc: Oral Oral Oral Oral  Resp: '16 16 16 16  '$ Height:      Weight:      SpO2: 100% 98% 100% 100%    Last BM Date: 08/06/15  Intake/Output   Yesterday:  03/04 0701 - 03/05 0700 In: 8182 [P.O.:240; TPN:1116] Out: 235 [Drains:235] This shift:     Bowel function:  Flatus: y  BM: small  Drain: feculent  Physical Exam:  General: Pt resting no acute distress.  Eyes: PERRL, normal EOM.  Sclera clear.  No icterus Neuro: CN II-XII intact w/o focal sensory/motor deficits. Lymph: No head/neck/groin lymphadenopathy Psych:  No delerium/psychosis/paranoia HENT: Normocephalic, Mucus membranes moist.  No thrush Neck: Supple, No tracheal deviation Chest: No chest wall pain w good excursion CV:  Pulses intact.  Regular rhythm MS: Normal AROM mjr joints.  No obvious deformity Abdomen: Soft.  Nondistended.  Mildly tender at RLQ only.  No evidence of peritonitis.  No incarcerated hernias. Ext:  SCDs BLE.  No mjr edema.  No cyanosis Skin: No petechiae / purpura  Results:   Labs: Results for orders placed or performed during the hospital encounter of 07/30/15 (from the past 48 hour(s))  Glucose, capillary     Status: Abnormal   Collection Time: 08/07/15 11:45 AM  Result Value Ref Range   Glucose-Capillary 120 (H) 65 - 99 mg/dL  Glucose, capillary  Status: Abnormal   Collection Time: 08/07/15  6:28 PM  Result Value Ref Range   Glucose-Capillary 111 (H) 65 - 99 mg/dL  Glucose, capillary     Status: Abnormal   Collection Time: 08/07/15 11:55 PM  Result Value Ref Range   Glucose-Capillary 132 (H) 65 - 99 mg/dL  Glucose, capillary     Status: Abnormal   Collection Time: 08/08/15  6:00 AM  Result Value Ref Range   Glucose-Capillary 141 (H) 65 - 99 mg/dL  Basic metabolic panel     Status: Abnormal   Collection Time: 08/08/15  6:20 AM  Result Value Ref Range   Sodium 136 135 - 145 mmol/L    Potassium 4.0 3.5 - 5.1 mmol/L   Chloride 104 101 - 111 mmol/L   CO2 25 22 - 32 mmol/L   Glucose, Bld 125 (H) 65 - 99 mg/dL   BUN 13 6 - 20 mg/dL   Creatinine, Ser 0.56 0.44 - 1.00 mg/dL   Calcium 8.4 (L) 8.9 - 10.3 mg/dL   GFR calc non Af Amer >60 >60 mL/min   GFR calc Af Amer >60 >60 mL/min    Comment: (NOTE) The eGFR has been calculated using the CKD EPI equation. This calculation has not been validated in all clinical situations. eGFR's persistently <60 mL/min signify possible Chronic Kidney Disease.    Anion gap 7 5 - 15  CBC     Status: Abnormal   Collection Time: 08/08/15  6:20 AM  Result Value Ref Range   WBC 8.8 4.0 - 10.5 K/uL   RBC 3.73 (L) 3.87 - 5.11 MIL/uL   Hemoglobin 10.2 (L) 12.0 - 15.0 g/dL   HCT 32.5 (L) 36.0 - 46.0 %   MCV 87.1 78.0 - 100.0 fL   MCH 27.3 26.0 - 34.0 pg   MCHC 31.4 30.0 - 36.0 g/dL   RDW 15.2 11.5 - 15.5 %   Platelets 469 (H) 150 - 400 K/uL  Magnesium     Status: None   Collection Time: 08/08/15  6:20 AM  Result Value Ref Range   Magnesium 2.2 1.7 - 2.4 mg/dL  Phosphorus     Status: None   Collection Time: 08/08/15  6:20 AM  Result Value Ref Range   Phosphorus 3.5 2.5 - 4.6 mg/dL  Glucose, capillary     Status: Abnormal   Collection Time: 08/08/15  2:00 PM  Result Value Ref Range   Glucose-Capillary 110 (H) 65 - 99 mg/dL   Comment 1 Notify RN   Glucose, capillary     Status: Abnormal   Collection Time: 08/08/15  6:22 PM  Result Value Ref Range   Glucose-Capillary 112 (H) 65 - 99 mg/dL  Glucose, capillary     Status: Abnormal   Collection Time: 08/09/15 12:15 AM  Result Value Ref Range   Glucose-Capillary 128 (H) 65 - 99 mg/dL   Comment 1 Notify RN   Glucose, capillary     Status: Abnormal   Collection Time: 08/09/15  6:24 AM  Result Value Ref Range   Glucose-Capillary 113 (H) 65 - 99 mg/dL    Imaging / Studies: No results found.  Medications / Allergies: per chart  Antibiotics: Anti-infectives    Start     Dose/Rate  Route Frequency Ordered Stop   08/07/15 1400  piperacillin-tazobactam (ZOSYN) IVPB 3.375 g     3.375 g 12.5 mL/hr over 240 Minutes Intravenous 3 times per day 08/07/15 0905     08/07/15 0600  ciprofloxacin (CIPRO)  tablet 500 mg  Status:  Discontinued     500 mg Oral 2 times daily 08/07/15 0541 08/07/15 0842   08/06/15 0830  fluconazole (DIFLUCAN) IVPB 100 mg  Status:  Discontinued     100 mg 50 mL/hr over 60 Minutes Intravenous Every 24 hours 08/06/15 0819 08/06/15 0846   08/05/15 1600  anidulafungin (ERAXIS) 100 mg in sodium chloride 0.9 % 100 mL IVPB     100 mg over 90 Minutes Intravenous Every 24 hours 08/04/15 1509 08/07/15 2039   08/04/15 1600  anidulafungin (ERAXIS) 200 mg in sodium chloride 0.9 % 200 mL IVPB    Comments:  Pharmacy may adjust dosing strength, schedule, rate of infusion, etc as needed to optimize therapy   200 mg over 180 Minutes Intravenous  Once 08/04/15 1509 08/04/15 1928   07/30/15 1645  piperacillin-tazobactam (ZOSYN) IVPB 3.375 g  Status:  Discontinued     3.375 g 12.5 mL/hr over 240 Minutes Intravenous 3 times per day 07/30/15 1634 08/07/15 0541   07/30/15 1545  piperacillin-tazobactam (ZOSYN) IVPB 3.375 g     3.375 g 100 mL/hr over 30 Minutes Intravenous  Once 07/30/15 1541 07/30/15 1757        Note: Portions of this report may have been transcribed using voice recognition software. Every effort was made to ensure accuracy; however, inadvertent computerized transcription errors may be present.   Any transcriptional errors that result from this process are unintentional.     Adin Hector, M.D., F.A.C.S. Gastrointestinal and Minimally Invasive Surgery [facility] [address] [place], [place] [address] [phone] Main / Paging   08/09/2015  CARE TEAM:  PCP: Merrilee Seashore, MD  Outpatient Care Team: Patient Care Team: Merrilee Seashore, MD as PCP - General (Internal Medicine) Vivien Rossetti, MD as Consulting Physician (Oncology) Gordy Levan, MD as Consulting Physician (Oncology) Megan Salon, MD as Consulting Physician (Gynecology)  Inpatient Treatment Team: Treatment Team: Attending Provider: Kelvin Cellar, MD; Consulting Physician: Nolon Nations, MD; Consulting Physician: Gordy Levan, MD; Rounding Team: Redmond Baseman, MD; Registered Nurse: Lolita Rieger, RN; Technician: Gardiner Ramus, NT; Registered Nurse: Barrington Ellison, RN

## 2015-08-10 LAB — CBC
HEMATOCRIT: 33.1 % — AB (ref 36.0–46.0)
Hemoglobin: 10.2 g/dL — ABNORMAL LOW (ref 12.0–15.0)
MCH: 26.9 pg (ref 26.0–34.0)
MCHC: 30.8 g/dL (ref 30.0–36.0)
MCV: 87.3 fL (ref 78.0–100.0)
PLATELETS: 475 10*3/uL — AB (ref 150–400)
RBC: 3.79 MIL/uL — ABNORMAL LOW (ref 3.87–5.11)
RDW: 15.3 % (ref 11.5–15.5)
WBC: 9.7 10*3/uL (ref 4.0–10.5)

## 2015-08-10 LAB — COMPREHENSIVE METABOLIC PANEL
ALK PHOS: 70 U/L (ref 38–126)
ALT: 8 U/L — ABNORMAL LOW (ref 14–54)
ANION GAP: 7 (ref 5–15)
AST: 21 U/L (ref 15–41)
Albumin: 2.8 g/dL — ABNORMAL LOW (ref 3.5–5.0)
BUN: 17 mg/dL (ref 6–20)
CALCIUM: 8.4 mg/dL — AB (ref 8.9–10.3)
CO2: 24 mmol/L (ref 22–32)
Chloride: 104 mmol/L (ref 101–111)
Creatinine, Ser: 0.69 mg/dL (ref 0.44–1.00)
Glucose, Bld: 119 mg/dL — ABNORMAL HIGH (ref 65–99)
Potassium: 4.8 mmol/L (ref 3.5–5.1)
SODIUM: 135 mmol/L (ref 135–145)
Total Bilirubin: 1.3 mg/dL — ABNORMAL HIGH (ref 0.3–1.2)
Total Protein: 6.8 g/dL (ref 6.5–8.1)

## 2015-08-10 LAB — DIFFERENTIAL
BASOS ABS: 0 10*3/uL (ref 0.0–0.1)
BASOS PCT: 0 %
EOS ABS: 0.4 10*3/uL (ref 0.0–0.7)
Eosinophils Relative: 4 %
Lymphocytes Relative: 3 %
Lymphs Abs: 0.3 10*3/uL — ABNORMAL LOW (ref 0.7–4.0)
MONOS PCT: 10 %
Monocytes Absolute: 1 10*3/uL (ref 0.1–1.0)
NEUTROS ABS: 8 10*3/uL — AB (ref 1.7–7.7)
NEUTROS PCT: 83 %

## 2015-08-10 LAB — MAGNESIUM: MAGNESIUM: 2.3 mg/dL (ref 1.7–2.4)

## 2015-08-10 LAB — GLUCOSE, CAPILLARY
GLUCOSE-CAPILLARY: 105 mg/dL — AB (ref 65–99)
GLUCOSE-CAPILLARY: 120 mg/dL — AB (ref 65–99)
GLUCOSE-CAPILLARY: 122 mg/dL — AB (ref 65–99)
GLUCOSE-CAPILLARY: 143 mg/dL — AB (ref 65–99)
Glucose-Capillary: 125 mg/dL — ABNORMAL HIGH (ref 65–99)

## 2015-08-10 LAB — PREALBUMIN: PREALBUMIN: 13.7 mg/dL — AB (ref 18–38)

## 2015-08-10 LAB — TRIGLYCERIDES: TRIGLYCERIDES: 115 mg/dL (ref <150)

## 2015-08-10 LAB — PHOSPHORUS: PHOSPHORUS: 3.1 mg/dL (ref 2.5–4.6)

## 2015-08-10 MED ORDER — BOOST / RESOURCE BREEZE PO LIQD
1.0000 | Freq: Three times a day (TID) | ORAL | Status: DC
  Administered 2015-08-10 – 2015-08-18 (×21): 1 via ORAL

## 2015-08-10 MED ORDER — FAT EMULSION 20 % IV EMUL
240.0000 mL | INTRAVENOUS | Status: AC
  Administered 2015-08-10: 240 mL via INTRAVENOUS
  Filled 2015-08-10: qty 240

## 2015-08-10 MED ORDER — TRACE MINERALS CR-CU-MN-SE-ZN 10-1000-500-60 MCG/ML IV SOLN
INTRAVENOUS | Status: AC
  Administered 2015-08-10: 18:00:00 via INTRAVENOUS
  Filled 2015-08-10: qty 1992

## 2015-08-10 NOTE — Progress Notes (Signed)
Date:  August 10, 2015 Chart reviewed for concurrent status and case management needs. Will continue to follow patient for changes and needs: poor po intake , iv tpn beginning to take po's Velva Harman, BSN, Therapist, sports, [place]

## 2015-08-10 NOTE — Progress Notes (Signed)
PARENTERAL NUTRITION CONSULT NOTE - INITIAL  Pharmacy Consult for TPN Indication: Perforated bowel; intolerant to enteral feeds  No Known Allergies  Patient Measurements: Height: 5' (152.4 cm) Weight: 213 lb 3 oz (96.7 kg) IBW/kg (Calculated) : 45.5 Adjusted Body Weight: 58.5 kg Usual Weight: ~98kg  Vital Signs: Temp: 99.6 F (37.6 C) (03/06 0532) Temp Source: Oral (03/06 0532) BP: 120/71 mmHg (03/06 0532) Pulse Rate: 93 (03/06 0532) Intake/Output from previous day: 03/05 0701 - 03/06 0700 In: 1146 [TPN:1116] Out: 125 [Drains:125] Intake/Output from this shift:    Labs:  Recent Labs  08/08/15 0620 08/10/15 0604  WBC 8.8 9.7  HGB 10.2* 10.2*  HCT 32.5* 33.1*  PLT 469* 475*    Recent Labs  08/08/15 0620 08/10/15 0604  NA 136 135  K 4.0 4.8  CL 104 104  CO2 25 24  GLUCOSE 125* 119*  BUN 13 17  CREATININE 0.56 0.69  CALCIUM 8.4* 8.4*  MG 2.2 2.3  PHOS 3.5 3.1  PROT  --  6.8  ALBUMIN  --  2.8*  AST  --  21  ALT  --  8*  ALKPHOS  --  70  BILITOT  --  1.3*  PREALBUMIN  --  13.7*  TRIG  --  115   Estimated Creatinine Clearance: 77.9 mL/min (by C-G formula based on Cr of 0.69).    Recent Labs  08/09/15 1742 08/09/15 2348 08/10/15 0537  GLUCAP 106* 120* 105*   Medical History: Past Medical History  Diagnosis Date  . Abnormal uterine bleeding   . Anemia   . Fibroid   . Heart murmur     under 5 yrs of age  . Cancer ([place]) dx'd 07/2014    endometrial stromal sarcoma  . Radiation 12/01/14-01/05/15    pelvis 45 gray  . Radiation 05/18/2015-06/05/15    T4 thoracic spine area 35 gray   Assessment: 60 yo F admitted 2/23 with abdominal pain and noted to have perforated bowel.  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  She has been on Zosyn since admission for IAI and Eraxis was added 2/28.    Clear liquid diet was attempted 2/25 however patient developed severe abdominal pain with nausea/vomiting.    Insulin Requirements during previous day: on moderate SSI q6h (no insulin in the past 24 hrs) - no hx DM  Current Nutrition: liquid diet - boost/Resource started on 3/6  IVF: none  Central access: implanted port 11/19/14 TPN start date: 08/05/15  ASSESSMENT                                                                                                          HPI: 60 yo F admitted 2/23 with abdominal pain and noted to have perforated bowel.  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 She has been on Zosyn  since admission for IAI and Eraxis was added 2/28.   Clear liquid diet was attempted 2/25 however patient developed severe abdominal pain with nausea/vomiting.    Significant events:  3/1: IR consulted for drain adjustment (bulb changed) 3/3: TPN advanced to goal rate 3/5: abscess drainage decreased, 85 ml/24 hr, + BM's 3/6: patient reported BM this morning and eating about ~75% of meals (not documented in [facility]), Resource started  Today:   Glucose (goal cbgs <150): all cbgs within goal  Electrolytes: lytes wnl  Renal:  stable at patient's baseline  LFTs: TBili elevated but trending down (1.3).  Patient does not appear jaundiced.   TGs: 188 (3/2), 115 (3/6)  Prealbumin: 6.4 (3/2), 13.7 (3/6)  NUTRITIONAL GOALS                                                                                             Estimated Goals: 1750-2050  kCal/d, 90-120 Gm/d (1.2-2 Gm/Kg/day using AdjBW); >1.5L Fluid/day Clinimix E 5/15 at a goal rate of 83 ml/hr + 20% fat emulsion at 10 ml/hr to provide: 100g/day protein, 1894 Kcal/day.  PLAN                                                                                                                          At 1800 today:  Continue Clinimix E 5/15 at 83 ml/hr.  20% fat emulsion at 10 ml/hr.  TPN to contain standard multivitamins and trace elements.  Continue moderate SSI q6h  TPN lab panels on Mondays & Thursdays.  F/u daily.  Dia Sitter, PharmD, BCPS 08/10/2015 8:52 AM

## 2015-08-10 NOTE — Progress Notes (Signed)
[facility] PROGRESS NOTE  Erin Santiago [id number] DOB: 25-Aug-1955 DOA: 07/30/2015 PCP: Merrilee Seashore, MD  Brief interval history  Erin Santiago is a 60 y.o. 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 Patient is status post chemotherapy and radiation to the pelvis and back and was due to start chemotherapy on the day of admission due to progression of disease involving the left abdominal pain. Patient had presented to the ED with complaints of 1-2 day history of diffuse abdominal pain. Patient stated that she woke up around 2 AM one day prior to admission with significant diffuse abdominal pain and when she stood up to go through her bathroom she lost control of her bladder due to probable incontinence. Patient stated that the whole day prior to admission was lethargic and very sleepy and slept all day. Patient does endorse subjective fevers, chills, nausea, emesis, generalized weakness, diarrhea. Patient also endorses some hematochezia. Patient denies chest pain, no constipation, no dysuria, no hematemesis, no melena. Patient does endorse an episode of hematemesis. Patient denies any cough. Patient was seen in the emergency room comprehensive metabolic profile done at a sodium of 133 bicarbonate of 19 BUN of 29 creatinine of 1.06 glucose of 125 AST of 13 ALT of 23 protein of 7.5 bilirubin of 3.4 otherwise is within normal limits. Initial lactic acid level is elevated at 2.67 and repeat lactic acid level was down to 0.93. CBC had a white count of 11.1 with no other abnormalities. Urinalysis pending. Acute abdominal series showed dilated small bowel loops containing residual contrast in the midabdomen most likely represent a component of small bowel obstruction. Bibasilar atelectasis. CT abdomen and pelvis did show bowel perforation.  Large 9 x 4.5 cm complex right lower quadrant and pelvic abscess containing a small amount of contrast suggesting communication with the distal ileum. This could be due to tumor erosion. Stable adenopathy in the abdomen and pelvis. Patient was seen in consultation by general surgery who recommended initially nonoperative percutaneous drainage and admission per hospitalist.  Patient was admitted to the stepdown unit general surgery was consulted patient was placed on bowel rest and monitored. Interventional radiology was also consulted for drain placement. Patient had a percutaneous drain placed on 07/31/2015 and was placed empirically on IV Zosyn from admission. Wound/abscess cultures grew out Escherichia coli. Patient was monitored. Patient's pain was controlled on pain medication. Oncology also assess the patient. Patient was started on clear liquids and as diet was advanced patient was noted to have abdominal pain with a fever and emesis. Patient's diet has subsequently been changed back to clear liquids. Repeat CT abdomen and pelvis pending for 08/04/2015. Will keep an stepdown unit. If patient deteriorates will likely need emergency exploratory laparotomy.      Assessment/Plan: #1 bowel perforation, complex intra-abdominal fluid collection 9 x 4.5 cm right lower quadrant and pelvic Questionable etiology. Patient with fever overnight with emesis when she tried oral intake of food. Patient states no significant improvement with abdominal pain since yesterday 08/03/2015. Possible invasion of cancer/ metastatic endometrial cancer into bowel. Per CT abdomen and pelvis looks like perforation may have been walled off and contained. Blood cultures pending. Patient currently afebrile. Leukocytosis trending down.  Patient has been seen in consultation by general surgery will recommending nonoperative percutaneous drainage placement. S/p RLQ percutaneous drain 07/31/2015 per IR.  Wound/abscess cultures growing  Escherichia coli, this organism was susceptible.  She has been on IV Zosyn.  -Gen. surgery and interventional radiology following. -Blood cultures obtained on 07/30/2015 show no growth 2  -On 08/10/2015 she reported some abdominal pain and bloating with soft diet. She was placed back on clears.  -Case discussed with Surgery, plan to repeat CT of Abd/Pelvis on Tues 08/11/2015  #2 history of iron deficiency anemia -Stable, lab work on 08/06/2015 showed hemoglobin of 10.2.   #3 dehydration/metabolic acidosis -Resolving. She was treated with IV fluids.   #4 hypokalemia -Potassium improved to 3.5 from 3.0 received IV potassium on 08/05/2015  #5 lactic acidosis Likely secondary to problem #1 and dehydration. Lactic acid levels trending down. Patient has been pancultured. Abscess growing Oxbow which is pan sensitive. Continue empiric IV Zosyn.  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 Patient status post chemotherapy and radiation to the pelvis. Patient was to start chemotherapy on the day of admission due to progression of disease involving the left, iliac adenopathy, 2 masses in the right lower quadrant seen on CT scan on 07/21/2015.  Oncology following.  #7 prophylaxis SCDs for DVT prophylaxis.   Code Status: Full Family Communication: Updated patient. No family at bedside. Disposition Plan: Plan to repeat CT scan of abdomen and pelvis in the next 24 hours   Consultants:  Gen. surgery: Dr. Excell Seltzer 07/30/2015   oncology: Dr. Marko Plume 07/31/2015  Interventional radiology  Procedures:  CT abdomen and pelvis 07/30/2015  Acute abdominal series 07/30/2015  Right lower quadrant abdominal abscess drain with CT 07/31/2015 for Dr. Annamaria Boots  Antibiotics:  IV Zosyn 07/30/2015  HPI/Subjective: Patient sitting at bedside chair, she reports abdominal pain and  bloating with soft diet.   Objective: Filed Vitals:   08/09/15 2019 08/10/15 0532  BP: 123/58 120/71  Pulse: 74 93  Temp: 98.4 F (36.9 C) 99.6 F (37.6 C)  Resp: 18 18    Intake/Output Summary (Last 24 hours) at 08/10/15 1358 Last data filed at 08/10/15 0800  Gross per 24 hour  Intake   1266 ml  Output    125 ml  Net   1141 ml   Filed Weights   08/06/15 0500 08/07/15 0500 08/07/15 1536  Weight: 97.2 kg (214 lb 4.6 oz) 97.07 kg (214 lb) 96.7 kg (213 lb 3 oz)    Exam:   General:  NAD, sitting at bedside chair, nontoxic  Cardiovascular: RRR  Respiratory: Normal respiratory effort, lungs are clear to auscultation bilaterally  Abdomen: Soft, nontender, nondistended. JP drain in place  Musculoskeletal: No clubbing cyanosis or edema.   Data Reviewed: Basic Metabolic Panel:  Recent Labs Lab 08/04/15 0508 08/05/15 0506 08/05/15 0511 08/06/15 0513 08/08/15 0620 08/10/15 0604  NA 139 135  --  137 136 135  K 4.1 3.0*  --  3.5 4.0 4.8  CL 104 93*  --  98* 104 104  CO2 26 27  --  31 25 24   GLUCOSE 91 77  --  127* 125* 119*  BUN 6 6  --  8 13 17   CREATININE 0.50 0.80  --  0.52 0.56 0.69  CALCIUM 8.2* 8.1*  --  8.3* 8.4* 8.4*  MG  --  1.8  --  1.9 2.2 2.3  PHOS  --   --  4.4 3.1 3.5 3.1   Liver Function Tests:  Recent Labs Lab 08/05/15 0506 08/06/15 0513 08/10/15 0604  AST 11* 11* 21  ALT 10*  10* 8*  ALKPHOS 48 50 70  BILITOT 2.6* 1.4* 1.3*  PROT 6.2* 6.1* 6.8  ALBUMIN 2.5* 2.5* 2.8*   No results for input(s): LIPASE, AMYLASE in the last 168 hours. No results for input(s): AMMONIA in the last 168 hours. CBC:  Recent Labs Lab 08/04/15 0508 08/05/15 0506 08/06/15 0513 08/08/15 0620 08/10/15 0604  WBC 9.6 11.4* 10.7* 8.8 9.7  NEUTROABS 7.3 9.0* 8.7*  --  8.0*  HGB 10.0* 10.1* 10.2* 10.2* 10.2*  HCT 30.0* 30.9* 31.3* 32.5* 33.1*  MCV 86.5 84.4 85.1 87.1 87.3  PLT 301 356 400 469* 475*   Cardiac Enzymes: No results for input(s): CKTOTAL, CKMB,  CKMBINDEX, TROPONINI in the last 168 hours. BNP (last 3 results) No results for input(s): BNP in the last 8760 hours.  ProBNP (last 3 results) No results for input(s): PROBNP in the last 8760 hours.  CBG:  Recent Labs Lab 08/09/15 1152 08/09/15 1742 08/09/15 2348 08/10/15 0537 08/10/15 1153  GLUCAP 120* 106* 120* 105* 143*    Recent Results (from the past 240 hour(s))  Culture, routine-abscess     Status: None   Collection Time: 07/31/15  3:51 PM  Result Value Ref Range Status   Specimen Description ABSCESS RIGHT LOWER ABDOMINAL QUADRANT  Final   Special Requests Normal  Final   Gram Stain   Final    FEW WBC PRESENT,BOTH PMN AND MONONUCLEAR NO SQUAMOUS EPITHELIAL CELLS SEEN FEW GRAM NEGATIVE RODS Performed at [facility]    Culture   Final    MODERATE ESCHERICHIA COLI Performed at [facility]    Report Status 08/03/2015 FINAL  Final   Organism ID, Bacteria ESCHERICHIA COLI  Final      Susceptibility   Escherichia coli - MIC*    AMPICILLIN <=2 SENSITIVE Sensitive     AMPICILLIN/SULBACTAM <=2 SENSITIVE Sensitive     CEFEPIME <=1 SENSITIVE Sensitive     CEFTAZIDIME <=1 SENSITIVE Sensitive     CEFTRIAXONE <=1 SENSITIVE Sensitive     CIPROFLOXACIN <=0.25 SENSITIVE Sensitive     GENTAMICIN <=1 SENSITIVE Sensitive     IMIPENEM <=0.25 SENSITIVE Sensitive     PIP/TAZO <=4 SENSITIVE Sensitive     TOBRAMYCIN <=1 SENSITIVE Sensitive     TRIMETH/SULFA Value in next row Sensitive      <=20 SENSITIVE(NOTE)    * MODERATE ESCHERICHIA COLI  Anaerobic culture     Status: None   Collection Time: 07/31/15  3:51 PM  Result Value Ref Range Status   Specimen Description ABSCESS RIGHT LOWER ABDOMINAL QUADRANT  Final   Special Requests Normal  Final   Gram Stain   Final    FEW WBC PRESENT,BOTH PMN AND MONONUCLEAR NO SQUAMOUS EPITHELIAL CELLS SEEN FEW GRAM NEGATIVE RODS Performed at [facility]    Culture   Final    BACTEROIDES VULGATUS Note: BETA  LACTAMASE POSITIVE Performed at [facility]    Report Status 08/05/2015 FINAL  Final  Culture, Urine     Status: None   Collection Time: 08/04/15 12:24 PM  Result Value Ref Range Status   Specimen Description URINE, RANDOM  Final   Special Requests NONE  Final   Culture   Final    NO GROWTH 1 DAY Performed at [facility]    Report Status 08/05/2015 FINAL  Final  Body fluid culture     Status: None   Collection Time: 08/04/15  6:30 PM  Result Value Ref Range Status   Specimen Description  ABDOMEN PERFORATED BOWEL  Final   Special Requests NONE  Final   Gram Stain   Final    RARE WBC PRESENT, PREDOMINANTLY MONONUCLEAR GRAM NEGATIVE RODS GRAM POSITIVE COCCI IN CHAINS IN PAIRS GRAM POSITIVE RODS    Culture   Final    MULTIPLE ORGANISMS PRESENT, NONE PREDOMINANT Performed at [facility]    Report Status 08/09/2015 FINAL  Final     Studies: No results found.  Scheduled Meds: . acetaminophen  1,000 mg Oral TID  . antiseptic oral rinse  7 mL Mouth Rinse BID  . feeding supplement  1 Container Oral TID BM  . insulin aspart  0-15 Units Subcutaneous 4 times per day  . nystatin   Topical BID  . piperacillin-tazobactam (ZOSYN)  IV  3.375 g Intravenous 3 times per day  . sodium chloride flush  3 mL Intravenous Q12H   Continuous Infusions: . Marland KitchenTPN (CLINIMIX-E) Adult 83 mL/hr at 08/09/15 1726   And  . fat emulsion 240 mL (08/09/15 1727)  . Marland KitchenTPN (CLINIMIX-E) Adult     And  . 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 If 7PM-7AM, please contact night-coverage at [email or web address], password [facility] 08/10/2015, 1:58 PM  LOS: 11 days

## 2015-08-10 NOTE — Progress Notes (Signed)
Patient ID: Erin Santiago, female   DOB: 08-29-1955, 60 y.o.   MRN: [id number]    Referring Physician(s): CCS  Supervising Physician: Sandi Mariscal  Chief Complaint: Right lower quadrant abscess from bowel perf   Subjective:  Pt sitting up in chair Has had BMs, tolerating liquid/pureed diet.  Allergies: Review of patient's allergies indicates no known allergies.  Medications:  Current facility-administered medications:  .  Marland KitchenTPN (CLINIMIX-E) Adult, , Intravenous, Continuous TPN, Last Rate: 83 mL/hr at 08/09/15 1726 **AND** fat emulsion 20 % infusion 240 mL, 240 mL, Intravenous, Continuous TPN, Minda Ditto, RPH, Last Rate: 10 mL/hr at 08/09/15 1727, 240 mL at 08/09/15 1727 .  Marland KitchenTPN (CLINIMIX-E) Adult, , Intravenous, Continuous TPN **AND** fat emulsion 20 % infusion 240 mL, 240 mL, Intravenous, Continuous TPN, Anh P Pham, RPH .  acetaminophen (TYLENOL) tablet 1,000 mg, 1,000 mg, Oral, TID, Michael Boston, MD, 1,000 mg at 08/10/15 1110 .  albuterol (PROVENTIL) (2.5 MG/3ML) 0.083% nebulizer solution 2.5 mg, 2.5 mg, Nebulization, Q2H PRN, Irine Seal V, MD .  antiseptic oral rinse (CPC / CETYLPYRIDINIUM CHLORIDE 0.05%) solution 7 mL, 7 mL, Mouth Rinse, BID, Eugenie Filler, MD, 7 mL at 08/10/15 1110 .  bisacodyl (DULCOLAX) suppository 10 mg, 10 mg, Rectal, Daily PRN, Irine Seal V, MD .  feeding supplement (BOOST / RESOURCE BREEZE) liquid 1 Container, 1 Container, Oral, TID BM, Lennis Marion Downer, MD, 1 Container at 08/10/15 1111 .  hydrocortisone cream 1 %, , Topical, PRN, Irine Seal V, MD .  insulin aspart (novoLOG) injection 0-15 Units, 0-15 Units, Subcutaneous, 4 times per day, Thomes Lolling, RPH, 2 Units at 08/09/15 0000 .  iohexol (OMNIPAQUE) 300 MG/ML solution 50 mL, 50 mL, Oral, Once PRN, Milton Ferguson, MD, 50 mL at 07/30/15 1220 .  LORazepam (ATIVAN) injection 0.5 mg, 0.5 mg, Intravenous, Q6H PRN, Irine Seal V, MD .  methocarbamol (ROBAXIN) 1,000 mg in dextrose  5 % 50 mL IVPB, 1,000 mg, Intravenous, Q6H PRN, Michael Boston, MD .  morphine 2 MG/ML injection 1-2 mg, 1-2 mg, Intravenous, Q2H PRN, Lennis Marion Downer, MD, 1 mg at 08/04/15 1001 .  nystatin (MYCOSTATIN/NYSTOP) topical powder, , Topical, BID, Lennis P Livesay, MD .  ondansetron (ZOFRAN) tablet 4 mg, 4 mg, Oral, Q6H PRN, 4 mg at 08/05/15 2123 **OR** ondansetron (ZOFRAN) 8 mg in sodium chloride 0.9 % 50 mL IVPB, 8 mg, Intravenous, Q6H PRN, Gordy Levan, MD, 8 mg at 08/04/15 1107 .  oxyCODONE (Oxy IR/ROXICODONE) immediate release tablet 5-10 mg, 5-10 mg, Oral, Q4H PRN, Michael Boston, MD .  piperacillin-tazobactam (ZOSYN) IVPB 3.375 g, 3.375 g, Intravenous, 3 times per day, Polly Cobia, RPH, 3.375 g at 08/10/15 0539 .  sodium chloride flush (NS) 0.9 % injection 10-40 mL, 10-40 mL, Intracatheter, PRN, Kelvin Cellar, MD .  sodium chloride flush (NS) 0.9 % injection 3 mL, 3 mL, Intravenous, Q12H, Eugenie Filler, MD, 3 mL at 08/10/15 1111    Vital Signs: BP 120/71 mmHg  Pulse 93  Temp(Src) 99.6 F (37.6 C) (Oral)  Resp 18  Ht 5' (1.524 m)  Wt 213 lb 3 oz (96.7 kg)  BMI 41.63 kg/m2  SpO2 98%  LMP 02/04/2014  Physical Exam RLQ drain intact, insertion site ok, mildly tender,  164m recorded yesterday, still grossly feculent  Imaging: No results found.  Labs:  CBC:  Recent Labs  08/05/15 0506 08/06/15 0513 08/08/15 0620 08/10/15 0604  WBC 11.4* 10.7* 8.8 9.7  HGB 10.1* 10.2* 10.2* 10.2*  HCT 30.9* 31.3* 32.5* 33.1*  PLT 356 400 469* 475*    COAGS:  Recent Labs  08/26/14 1300 07/30/15 2242 07/31/15 0428  INR 0.95 1.49 1.41  APTT 27  --  32    BMP:  Recent Labs  08/05/15 0506 08/06/15 0513 08/08/15 0620 08/10/15 0604  NA 135 137 136 135  K 3.0* 3.5 4.0 4.8  CL 93* 98* 104 104  CO2 _0 GLUCOSE 77 127* 125* 119*  BUN _1 CALCIUM 8.1* 8.3* 8.4* 8.4*  CREATININE 0.80 0.52 0.56 0.69  GFRNONAA >60 >60 >60 >60  GFRAA >60 >60 >60 >60     LIVER FUNCTION TESTS:  Recent Labs  08/03/15 0425 08/05/15 0506 08/06/15 0513 08/10/15 0604  BILITOT 2.8* 2.6* 1.4* 1.3*  AST 11* 11* 11* 21  ALT 8* 10* 10* 8*  ALKPHOS 52 48 50 70  PROT 5.4* 6.2* 6.1* 6.8  ALBUMIN 2.3* 2.5* 2.5* 2.8*    Assessment and Plan: Met endom ca; s/p drainage of RLQ abd abscess 2/24; AF f/u CT 3/1 revealed sig decompression of RLQ collection with no new collections Plans per CCS  Electronically Signed: Ascencion Dike 08/10/2015, 11:20 AM   I spent a total of 15 minutes at the the patient's bedside AND on the patient's hospital floor or unit, greater than 50% of which was counseling/coordinating care for RLQ abscess drain

## 2015-08-10 NOTE — Progress Notes (Signed)
MEDICAL ONCOLOGY August 10, 2015, 8:02 AM  Hospital day 12 Antibiotics: zosyn Chemotherapy: (last May 2016) Planned adriamycin/ olaratumab held  Outpatient Physicians:Emma Rossi/ Andrew Au, , [name], [name] [facility] sarcoma clinic),Ajith Ashby Dawes (PCP [facility]), [name]; [name], [name]   EMR reviewed including note from Dr Johney Maine on 3-5. Patient seen, no visitors here  Subjective: Has felt gradually better. Bowels moved well on 3-4, just a little on 3-5, none today and not sure if any flatus today / feels a little more uncomfortable "constipated". Otherwise no significant pain. Very little from drain overnight. Walked in hall yesterday, tolerated fairly well. Has been up in chair mostly, as this is more comfortable than bed. Denies SOB or chest pain. Has not had Boost/ Resource, wants to try Resource. Continues TNA.   Patient had been menopausal since age 64 until she had what seemed to her to be a menstrual period in Sept 2015, with large blood clot at completion of bleeding stopped then. She continued with lesser bleeding over next several months until she was seen in 06-2014 by [name]. Hemoglobin was 12.7 on 07-04-14. CT CAP 06-25-14 had uterus 16.4 x 13.5 x 14.4 cm with marked expansion of endometrial canal by heterogeneous mass, bilateral pelvic sidewall adenopathy, iliac adenopathy with no definite retroperitoneal or mesenteric adenopathy, no ascites, no liver mets. Attempted endometrial sampling 06-26-14 and 07-03-14 was nondiagnostic; around that time the patient was passing clots and using one large maxipad hourly. She was seen by [name] 07-05-14, with uterine fundus palpable above umbilicus. She had surgery by [name] at [facility] on 07-14-14, which was exploratory laparotomy with TAH BSO, bilateral total pelvic lymphadenectomy and sampling of aortic and renal nodes. 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 Case was presented at [facility] multidisciplinary conference 07-23-14, with recommendation for PET CT to evaluate inguinal and portahepatis nodes, and to consider adjuvant gemzar taxotere and possibly follow with 4 cycles of adriamycin, then to consider whole pelvic RT. She saw [name] for post op follow up on 07-28-14, with recommendation for gemzar taxotere and adriamycin, whole pelvic RT after chemo and consideration of Megace maintenance after chemo (possibly prior to negative ER PR information). PET 08-01-14 had some uptake in aortocaval and left paraaortic regions. She had day 1 cycle 1 gemzar taxotere on 08-28-14, day 8 cycle 1 on 09-04-14 and neulasta on 09-06-14. Admitted with neutropenic fever on day 14 cycle 1, with oral mucositis and some diarrhea. Counts maintained cycle 2 using OnPro neulasta day 9. She had progressive LE swelling after cycle 3, with venous dopplers negative 10-23-14, then LE swelling and SOB so marked after day 1 cycle 4 10-30-14 that chemo was held. Restaging CT AP + CXR 11-10-14 had stable tiny left pulmonary nodule/ no pleural effusion and normal heart size; CT had necrotic aortocaval adenopathy, iliac adenopathy L>R and 7x10 cm fluid collection in pelvis. She received IMRT 45 gray in 25 fractions to pelvis from 6-27 thru 01-05-15, with resolution of LE swelling by completion of course. CT CAP 02-03-15 showed improvement in retroperitoneal and pelvic involvement. She went onto observation after RT, plan to wait until clear progressive disease before resuming treatment, possibly adriamycin vs on study. CT CAP 05-07-15 showed new lytic lesion at T4, stable 2-3 mm pulmonary nodules, increased left pelvic and retroperitoneal nodes and RLQ peritoneal nodule.  Radiation therapy summary as follows: Indication for treatment: A new bone lesion in the right side of the T4 vertebral body, with  soft tissue extension slightly impinging upon the central spinal canal, some pain from this lesion Radiation treatment dates: 05/18/2015 through 06/05/2015 Site/dose: T4 thoracic spine area, 35 gray in 14 fractions   Objective: Vital signs in last 24 hours: Blood pressure 120/71, pulse 93, temperature 99.6 F (37.6 C), temperature source Oral, resp. rate 18, height 5' (1.524 m), weight 213 lb 3 oz (96.7 kg), last menstrual period 02/04/2014, SpO2 98 %. Up in chair, respirations not labored RA. Looks fatigued but not in obvious discomfort, and very pleasant as always. Oral mucosa clear and moist. PERRL, not icteric. Lungs without wheezes or rales. Heart RRR. PAC site ok infusing TNA. Abdomen obese, soft, not tender, minimal BS in lower quadrants. Drain bag recently emptied. LE no pitting edema, cords, tenderness. Feet warm. Moves all extremities equally  Intake/Output from previous day: 03/05 0701 - 03/06 0700 In: 1146 [TPN:1116] Out: 125 [Drains:125] Intake/Output this shift:      Lab Results:  Recent Labs  08/08/15 0620 08/10/15 0604  WBC 8.8 9.7  HGB 10.2* 10.2*  HCT 32.5* 33.1*  PLT 469* 475*   BMET  Recent Labs  08/08/15 0620 08/10/15 0604  NA 136 135  K 4.0 4.8  CL 104 104  CO2 25 24  GLUCOSE 125* 119*  BUN 13 17  CREATININE 0.56 0.69  CALCIUM 8.4* 8.4*    Studies/Results: No results found.   Assessment/Plan: 1. Bowel perforation/ possible tumor invasion into bowel in setting of progressive endometrial stromal sarcoma:IR drain, clinically improving. Surgery recommends repeat CT 3-6 or 3-7. 2.Endometrial stromal sarcoma: surgery [facility] 07-14-14, progression by start of cycle 4 adjuvant gemzar taxotere in 10-2014. Radiation to symptomatic aortocaval and bilateral iliac adenopathy. Further progression recently including T4, post radiation there. Not eligible for any of the arms of MATCH trial. Plan had been to start adriamycin + olaratumab just at time of this  complication, held with these acute problems, expect to begin outpatient when stable. Long term prognosis from the metastatic endometrial sarcoma is not good, however disease has not been rapidly progressive and patient has been very functional.  3.nutritional status: on full liquid diet + TNA. 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 Hx nonmelanoma skin cancer.  Please page if I can be of help between my rounds [place]

## 2015-08-10 NOTE — Progress Notes (Signed)
Nutrition Follow-up  DOCUMENTATION CODES:   Not applicable  INTERVENTION:  - Continue TPN per pharmacy - Continue to encourage PO intakes - Continue Boost Breeze TID and encourage intake of supplement - RD will continue to monitor for needs  NUTRITION DIAGNOSIS:   Inadequate oral intake related to altered GI function as evidenced by per patient/family report. 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 Patient had presented to the ED with complaints of 1-2 day history of diffuse abdominal pain.   3/6 Diet changes as follows from previous assessment: 3/3 @ 1140: CLD 3/4 @ 0807: FLD 3/4 @ 5638: CLD 3/4 @ 1058: Dysphagia 1, thin liquids  Per chart review, pt consumed 0% of dinner 3/4 and 0% of breakfast yesterday (3/5) with no other intakes documented. Pt reports she has been experiencing constipation and she plans to discuss this with MD today. She states that she experiences constipation and associated abdominal pain and pressure with intakes of solid foods but does not experience these symptoms with intakes of liquids. Pt states she has been trying to consume fluids throughout the day; encouraged her to continue with this.   Nutrition needs adjusted based on weight/BMI, medical hx. Pt currently receiving Clinimix E 5/15 @ 83 mL/hr with 20% lipids @ 10 mL/hr which is providing 100 grams of protein (83% minimum estimated protein needs), 1894 kcal (97% minimum estimated kcal needs) from TPN regimen. No new weight recorded since 08/07/15.  Pt meeting kcal needs and likely >/= 90%  estimated protein needs from TPN regimen and minimal PO intakes. Medications reviewed. Labs reviewed; CBGs: 105-120 mg/dL in the past 24 hours, Ca: 8.4 mg/dL.    3/3 - Pt continues to be NPO, with sips of CLD from time to time.  - She complains of thirst, but no nausea/vomiting.  - Less abd pain than previous visit. - Per surgery, they will discuss with Dr. Johney Maine if patient will begin CLD.  - Plan per pharmacy:     At 1800 today:  Increase Clinimix E5/15 to 83 ml/hr.  20% fat emulsion at 10 ml/hr.  Clinimix E 5/15 @ 83 with 20% lipids @ 10 mL/hr will provide 1894 calories (100% of needs), 100g protein (83.3% of estimated needs)  Per MD note, will tx to [place]   3/1 - Pt with a bowel perforation, possibly secondary to tumor invasion into her bowel. - Per PA note, 441m have been drained from R L quadrant and pelvis, via IR. - She is to undergo follow up CT today. - Spoke with pt at bedside briefly.  - RD consulted for new TPN.  - She endorses a good appetite, no weight loss.  - Stated her eating habits are erratic but she normally eats 3 solid meals per day. - She complains of being unable to taste sweet foods s/p her first round of chemo.  - She said her ability to taste sweets did not return for 6 months. - She was supposed to start chemo upon admission, but has been held for now. - Plan per pharmacy:    At 1800  today:  Start Clinimix E5/15 at 51m/hr.  20% fat emulsion at 10 ml/hr.  Plan to advance as tolerated to goal rate 855mhr   Diet Order:  DIET - DYS 1 Room service appropriate?: Yes; Fluid consistency:: Thin .TPN (CLINIMIX-E) Adult .TPN (CLINIMIX-E) Adult  Skin:  Wound (see comment) (R abdominal incision )  Last BM:  3/2  Height:   Ht Readings from Last 1 Encounters:  08/07/15 5' (1.524 m)    Weight:   Wt Readings from Last 1 Encounters:  08/07/15 213 lb 3 oz (96.7 kg)    Ideal Body Weight:  45.45 kg  BMI:  Body mass index is 41.63  kg/(m^2).  Estimated Nutritional Needs:   Kcal:  1950-2150 (20-22 kcal/kg)  Protein:  120-140 grams of protein (1.25-1.45 grams/kg)  Fluid:  2 L/day  EDUCATION NEEDS:   No education needs identified at this time     JeJarome MatinRD, LDN Inpatient Clinical Dietitian Pager # [phone] hours/weekend pager # [phone]

## 2015-08-10 NOTE — Progress Notes (Signed)
  Subjective: She is better, she thought the Pureed diet was giving her constipation, but she is feeling better now.  Dr. Coralyn Pear put her back on clears this AM, she is taking Breeze and likes it.  Her pain is better.  Drainage is dark brown colored fluid, not much in bag now.    Objective: Vital signs in last 24 hours: Temp:  [98.2 F (36.8 C)-99.6 F (37.6 C)] 99.6 F (37.6 C) (03/06 0532) Pulse Rate:  [74-93] 93 (03/06 0532) Resp:  [16-18] 18 (03/06 0532) BP: (120-123)/(58-71) 120/71 mmHg (03/06 0532) SpO2:  [98 %-100 %] 98 % (03/06 0532) Last BM Date: 08/06/15 Drain 125 Afebrile, VSS Labs stable,  Prealbumin 13.7 2 cultures, one with multiple species, and one with E coli. No growth on urine culture.   Intake/Output from previous day: 03/05 0701 - 03/06 0700 In: 1146 [TPN:1116] Out: 125 [Drains:125] Intake/Output this shift:    General appearance: alert, cooperative and no distress Resp: clear to auscultation bilaterally GI: soft, non-tender; bowel sounds normal; no masses,  no organomegaly and drain RLQ appears to have thin brown colored fluid in the bag.  Lab Results:   Recent Labs  08/08/15 0620 08/10/15 0604  WBC 8.8 9.7  HGB 10.2* 10.2*  HCT 32.5* 33.1*  PLT 469* 475*    BMET  Recent Labs  08/08/15 0620 08/10/15 0604  NA 136 135  K 4.0 4.8  CL 104 104  CO2 25 24  GLUCOSE 125* 119*  BUN 13 17  CREATININE 0.56 0.69  CALCIUM 8.4* 8.4*   PT/INR No results for input(s): LABPROT, INR in the last 72 hours.   Recent Labs Lab 08/05/15 0506 08/06/15 0513 08/10/15 0604  AST 11* 11* 21  ALT 10* 10* 8*  ALKPHOS 48 50 70  BILITOT 2.6* 1.4* 1.3*  PROT 6.2* 6.1* 6.8  ALBUMIN 2.5* 2.5* 2.8*     Lipase     Component Value Date/Time   LIPASE 18 07/30/2015 1251     Studies/Results: No results found.  Medications: . acetaminophen  1,000 mg Oral TID  . antiseptic oral rinse  7 mL Mouth Rinse BID  . feeding supplement  1 Container Oral TID BM   . insulin aspart  0-15 Units Subcutaneous 4 times per day  . nystatin   Topical BID  . piperacillin-tazobactam (ZOSYN)  IV  3.375 g Intravenous 3 times per day  . sodium chloride flush  3 mL Intravenous Q12H   . Marland KitchenTPN (CLINIMIX-E) Adult 83 mL/hr at 08/09/15 1726   And  . fat emulsion 240 mL (08/09/15 1727)  . Marland KitchenTPN (CLINIMIX-E) Adult     And  . fat emulsion      Assessment/Plan  Contained bowel perforation, vs tumor invasion of the bowel IR drain 07/31/15  - culture: E coli Endometrial sarcoma,with bilateral  pelvic node involvement/spinal & intraabdominal metastasis Hx of hysterectomy/nephrectomy [facility] 2016  -- chemo/radiation therapy/[facility] Vivien Rossetti, MD  Hx of gastric sleeve  Anemia secondary to chronic blood loss Body mass index is 41.6 malnutrition   Antibiotics:  Day 11 Zosyn DVT:  SCD's added    Plan:  She is on TNA for her nutrition and she has been on Zosyn since 07/30/15.  Her last CT was 08/05/15.  We are going to get a CT this week.  I will tentatively set it up for tomorrow.       LOS: 11 days    Gennell How 08/10/2015

## 2015-08-10 NOTE — Progress Notes (Signed)
PT Cancellation Note  Patient Details Name: Erin Santiago MRN: [id number] DOB: Mar 04, 1956   Cancelled Treatment:    Reason Eval/Treat Not Completed: Medical issues which prohibited therapy (states that she wants to talk to surgeon. Having bowel issues. states that she will walk later.)   [name] 08/10/2015, 9:42 AM Tresa Endo PT [phone]

## 2015-08-10 NOTE — Progress Notes (Signed)
Occupational Therapy Treatment Patient Details Name: Erin Santiago MRN: [id number] DOB: [birth year] Today's Date: 08/10/2015    History of present illness 60 yo female admitted 2/23 with abdominal pain, h/o endometrial stromal sarcome, underwent surgery in February. S/P placement  of drain for abcess.   OT comments  Pt walked to BR with OT. Pt feeling better this day as she said she had a BM this morning  Follow Up Recommendations  Home health OT;Supervision/Assistance - 24 hour    Equipment Recommendations  3 in 1 bedside comode;Other (comment) (RW)    Recommendations for Other Services      Precautions / Restrictions Precautions Precautions: Fall Precaution Comments: R ABD drain Restrictions Weight Bearing Restrictions: No       Mobility Bed Mobility               General bed mobility comments: OOB in recliner  Transfers Overall transfer level: Needs assistance Equipment used: None Transfers: Sit to/from Stand;Stand Pivot Transfers Sit to Stand: Supervision Stand pivot transfers: Supervision       General transfer comment: A for lines        ADL Overall ADL's : Needs assistance/impaired     Grooming: Set up;Sitting               Lower Body Dressing: Minimal assistance;Sit to/from stand (pt does not want AE)   Toilet Transfer: Supervision/safety   Toileting- Clothing Manipulation and Hygiene: Supervision/safety;Sit to/from stand       Functional mobility during ADLs: Supervision/safety;Cueing for safety                  Cognition   Behavior During Therapy: [facility] for tasks assessed/performed Overall Cognitive Status: Within Functional Limits for tasks assessed                                    Pertinent Vitals/ Pain       Pain Assessment: No/denies pain         Frequency Min 2X/week     Progress Toward Goals  OT Goals(current goals can now be found in the care plan section)  Progress towards OT goals:  Progressing toward goals     Plan Discharge plan remains appropriate       End of Session     Activity Tolerance Patient tolerated treatment well   Patient Left in chair;with call bell/phone within reach   Nurse Communication          Time: 1231-1250 OT Time Calculation (min): 19 min  Charges: OT General Charges $OT Visit: 1 Procedure OT Treatments $Self Care/Home Management : 8-22 mins  Erin Santiago, Thereasa Parkin 08/10/2015, 12:54 PM

## 2015-08-11 ENCOUNTER — Inpatient Hospital Stay (HOSPITAL_COMMUNITY): Payer: Federal, State, Local not specified - PPO

## 2015-08-11 LAB — URINE MICROSCOPIC-ADD ON

## 2015-08-11 LAB — GLUCOSE, CAPILLARY
GLUCOSE-CAPILLARY: 118 mg/dL — AB (ref 65–99)
GLUCOSE-CAPILLARY: 138 mg/dL — AB (ref 65–99)
Glucose-Capillary: 120 mg/dL — ABNORMAL HIGH (ref 65–99)
Glucose-Capillary: 139 mg/dL — ABNORMAL HIGH (ref 65–99)

## 2015-08-11 LAB — URINALYSIS, ROUTINE W REFLEX MICROSCOPIC
Bilirubin Urine: NEGATIVE
Glucose, UA: NEGATIVE mg/dL
Ketones, ur: NEGATIVE mg/dL
LEUKOCYTES UA: NEGATIVE
NITRITE: NEGATIVE
PROTEIN: 30 mg/dL — AB
Specific Gravity, Urine: 1.015 (ref 1.005–1.030)
pH: 6 (ref 5.0–8.0)

## 2015-08-11 MED ORDER — M.V.I. ADULT IV INJ
INJECTION | INTRAVENOUS | Status: AC
  Administered 2015-08-11: 18:00:00 via INTRAVENOUS
  Filled 2015-08-11: qty 1992

## 2015-08-11 MED ORDER — IOHEXOL 300 MG/ML  SOLN
100.0000 mL | Freq: Once | INTRAMUSCULAR | Status: AC | PRN
  Administered 2015-08-11: 100 mL via INTRAVENOUS

## 2015-08-11 MED ORDER — FAT EMULSION 20 % IV EMUL
240.0000 mL | INTRAVENOUS | Status: AC
  Administered 2015-08-11: 240 mL via INTRAVENOUS
  Filled 2015-08-11: qty 250

## 2015-08-11 NOTE — Progress Notes (Signed)
[facility] PROGRESS NOTE  Erin Santiago [id number] DOB: Nov 10, 1955 DOA: 07/30/2015 PCP: Merrilee Seashore, MD  Brief interval history  Erin Santiago is a 60 y.o. 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 Patient is status post chemotherapy and radiation to the pelvis and back and was due to start chemotherapy on the day of admission due to progression of disease involving the left abdominal pain. Patient had presented to the ED with complaints of 1-2 day history of diffuse abdominal pain. Patient stated that she woke up around 2 AM one day prior to admission with significant diffuse abdominal pain and when she stood up to go through her bathroom she lost control of her bladder due to probable incontinence. Patient stated that the whole day prior to admission was lethargic and very sleepy and slept all day. Patient does endorse subjective fevers, chills, nausea, emesis, generalized weakness, diarrhea. Patient also endorses some hematochezia. Patient denies chest pain, no constipation, no dysuria, no hematemesis, no melena. Patient does endorse an episode of hematemesis. Patient denies any cough. Patient was seen in the emergency room comprehensive metabolic profile done at a sodium of 133 bicarbonate of 19 BUN of 29 creatinine of 1.06 glucose of 125 AST of 13 ALT of 23 protein of 7.5 bilirubin of 3.4 otherwise is within normal limits. Initial lactic acid level is elevated at 2.67 and repeat lactic acid level was down to 0.93. CBC had a white count of 11.1 with no other abnormalities. Urinalysis pending. Acute abdominal series showed dilated small bowel loops containing residual contrast in the midabdomen most likely represent a component of small bowel obstruction. Bibasilar atelectasis. CT abdomen and pelvis did show bowel perforation.  Large 9 x 4.5 cm complex right lower quadrant and pelvic abscess containing a small amount of contrast suggesting communication with the distal ileum. This could be due to tumor erosion. Stable adenopathy in the abdomen and pelvis. Patient was seen in consultation by general surgery who recommended initially nonoperative percutaneous drainage and admission per hospitalist.  Patient was admitted to the stepdown unit general surgery was consulted patient was placed on bowel rest and monitored. Interventional radiology was also consulted for drain placement. Patient had a percutaneous drain placed on 07/31/2015 and was placed empirically on IV Zosyn from admission. Wound/abscess cultures grew out Escherichia coli. Patient was monitored. Patient's pain was controlled on pain medication. Oncology also assess the patient. Patient was started on clear liquids and as diet was advanced patient was noted to have abdominal pain with a fever and emesis. Patient's diet has subsequently been changed back to clear liquids. Repeat CT abdomen and pelvis pending for 08/04/2015. Will keep an stepdown unit. If patient deteriorates will likely need emergency exploratory laparotomy.   Assessment/Plan: #1 bowel perforation, complex intra-abdominal fluid collection 9 x 4.5 cm right lower quadrant and pelvic Questionable etiology. Patient with fever overnight with emesis when she tried oral intake of food. Patient states no significant improvement with abdominal pain since yesterday 08/03/2015. Possible invasion of cancer/ metastatic endometrial cancer into bowel. Per CT abdomen and pelvis looks like perforation may have been walled off and contained. Blood cultures pending. Patient currently afebrile. Leukocytosis trending down.  Patient has been seen in consultation by general surgery will recommending nonoperative percutaneous drainage placement. S/p RLQ percutaneous drain 07/31/2015 per IR.  Wound/abscess cultures growing  Escherichia coli, this organism was susceptible. She has been  on IV Zosyn.  -Gen. surgery and interventional radiology following. -Blood cultures obtained on 07/30/2015 show no growth 2  -She remains on TNA -On 08/10/2015 she reported some abdominal pain and bloating with soft diet. She was placed back on clears.  -On 08/11/2015 repeat CT scan of abdomen and pelvis did not reveal significant change in large pelvic abscess per radiology, measuring 7 x 11.5 cm. Percutaneous drainage catheter in place.  -Await further recommendations from general surgery  #2 history of iron deficiency anemia -Stable, lab work on 08/06/2015 showed hemoglobin of 10.2.   #3 dehydration/metabolic acidosis -Resolving. She was treated with IV fluids.   #4 hypokalemia -Potassium improved to 3.5 from 3.0 received IV potassium on 08/05/2015  #5 lactic acidosis Likely secondary to problem #1 and dehydration. Lactic acid levels trending down. Patient has been pancultured. Abscess growing [place] which is pan sensitive. Continue empiric IV Zosyn.  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 Patient status post chemotherapy and radiation to the pelvis. Patient was to start chemotherapy on the day of admission due to progression of disease involving the left, iliac adenopathy, 2 masses in the right lower quadrant seen on CT scan on 07/21/2015.   -She had a repeat CT scan on 08/11/2015 that revealed mild progression of abdominal pelvic lymphadenopathy consistent with progression of metastatic disease. -Radiology also revealed increase in mild hydronephrosis due to metastatic lymphadenopathy. Renal function remains stable.  -Dr. Marko Plume of medical oncology following  #7 prophylaxis SCDs for DVT prophylaxis.   Code Status: Full Family Communication: Updated patient. No family at  bedside. Disposition Plan: Plan to repeat CT scan of abdomen and pelvis in the next 24 hours   Consultants:  Gen. surgery: Dr. Excell Seltzer 07/30/2015   oncology: Dr. Marko Plume 07/31/2015  Interventional radiology  Procedures:  CT abdomen and pelvis 07/30/2015  Acute abdominal series 07/30/2015  Right lower quadrant abdominal abscess drain with CT 07/31/2015 for Dr. Annamaria Boots  Antibiotics:  IV Zosyn 07/30/2015  HPI/Subjective: Patient sitting at bedside chair, she reports abdominal pain bowel movements. She is on clears  Objective: Filed Vitals:   08/10/15 2136 08/11/15 0455  BP: 116/57 132/63  Pulse: 88 86  Temp: 100.1 F (37.8 C) 99.6 F (37.6 C)  Resp: 16 20    Intake/Output Summary (Last 24 hours) at 08/11/15 1659 Last data filed at 08/11/15 0604  Gross per 24 hour  Intake     10 ml  Output     10 ml  Net      0 ml   Filed Weights   08/06/15 0500 08/07/15 0500 08/07/15 1536  Weight: 97.2 kg (214 lb 4.6 oz) 97.07 kg (214 lb) 96.7 kg (213 lb 3 oz)    Exam:   General:  NAD, sitting at bedside chair, nontoxic  Cardiovascular: RRR  Respiratory: Normal respiratory effort, lungs are clear to auscultation bilaterally  Abdomen: Soft, nontender, nondistended. JP drain in place  Musculoskeletal: No clubbing cyanosis or edema.   Data Reviewed: Basic Metabolic Panel:  Recent Labs Lab 08/05/15 0506 08/05/15 0511 08/06/15 0513 08/08/15 0620 08/10/15 0604  NA 135  --  137 136 135  K 3.0*  --  3.5 4.0 4.8  CL 93*  --  98* 104 104  CO2 27  --  31 25 24   GLUCOSE 77  --  127* 125* 119*  BUN 6  --  8 13 17  CREATININE 0.80  --  0.52 0.56 0.69  CALCIUM 8.1*  --  8.3* 8.4* 8.4*  MG 1.8  --  1.9 2.2 2.3  PHOS  --  4.4 3.1 3.5 3.1   Liver Function Tests:  Recent Labs Lab 08/05/15 0506 08/06/15 0513 08/10/15 0604  AST 11* 11* 21  ALT 10* 10* 8*  ALKPHOS 48 50 70  BILITOT 2.6* 1.4* 1.3*  PROT 6.2* 6.1* 6.8  ALBUMIN 2.5* 2.5* 2.8*   No results for  input(s): LIPASE, AMYLASE in the last 168 hours. No results for input(s): AMMONIA in the last 168 hours. CBC:  Recent Labs Lab 08/05/15 0506 08/06/15 0513 08/08/15 0620 08/10/15 0604  WBC 11.4* 10.7* 8.8 9.7  NEUTROABS 9.0* 8.7*  --  8.0*  HGB 10.1* 10.2* 10.2* 10.2*  HCT 30.9* 31.3* 32.5* 33.1*  MCV 84.4 85.1 87.1 87.3  PLT 356 400 469* 475*   Cardiac Enzymes: No results for input(s): CKTOTAL, CKMB, CKMBINDEX, TROPONINI in the last 168 hours. BNP (last 3 results) No results for input(s): BNP in the last 8760 hours.  ProBNP (last 3 results) No results for input(s): PROBNP in the last 8760 hours.  CBG:  Recent Labs Lab 08/10/15 1153 08/10/15 1743 08/10/15 2326 08/11/15 0506 08/11/15 1208  GLUCAP 143* 122* 125* 118* 138*    Recent Results (from the past 240 hour(s))  Culture, Urine     Status: None   Collection Time: 08/04/15 12:24 PM  Result Value Ref Range Status   Specimen Description URINE, RANDOM  Final   Special Requests NONE  Final   Culture   Final    NO GROWTH 1 DAY Performed at [facility]    Report Status 08/05/2015 FINAL  Final  Body fluid culture     Status: None   Collection Time: 08/04/15  6:30 PM  Result Value Ref Range Status   Specimen Description ABDOMEN PERFORATED BOWEL  Final   Special Requests NONE  Final   Gram Stain   Final    RARE WBC PRESENT, PREDOMINANTLY MONONUCLEAR GRAM NEGATIVE RODS GRAM POSITIVE COCCI IN CHAINS IN PAIRS GRAM POSITIVE RODS    Culture   Final    MULTIPLE ORGANISMS PRESENT, NONE PREDOMINANT Performed at [facility]    Report Status 08/09/2015 FINAL  Final     Studies: Ct Abdomen Pelvis W Contrast  08/11/2015  CLINICAL DATA:  Followup endometrial stromal sarcoma and abscess. Recently began chemotherapy. Previous radiation therapy. EXAM: CT ABDOMEN AND PELVIS WITH CONTRAST TECHNIQUE: Multidetector CT imaging of the abdomen and pelvis was performed using the standard protocol following bolus  administration of intravenous contrast. CONTRAST:  157mL OMNIPAQUE IOHEXOL 300 MG/ML  SOLN COMPARISON:  08/04/2015 FINDINGS: Lower chest: Bibasilar atelectasis shows improvement since previous study. Mild cardiomegaly is stable. Hepatobiliary: No masses or other significant abnormality. Gallbladder is unremarkable. Pancreas: No mass, inflammatory changes, or other significant abnormality. Spleen: Within normal limits in size and appearance. Adrenals/Urinary Tract: No adrenal or renal masses identified. No evidence of right-sided hydronephrosis. Mild left hydronephrosis and ureterectasis shows mild increase due to left iliac pelvic lymphadenopathy which is mildly increased since previous study. 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 Left colonic diverticulosis is again demonstrated, without evidence of diverticulitis. No evidence of bowel obstruction. A left lower quadrant percutaneous drainage catheter remains in place within the anterior portion of a pelvic fluid collection. This pelvic fluid collection currently measures 7.0 x 11.5 cm and is not simply changed in size when  measured similarly on previous study. This is consistent with abscess. Vascular/Lymphatic: Portacaval lymph node on image 32 measures 1.4 cm and is not significant changed. Aorto-caval lymphadenopathy shows increase in size, currently measuring 3.4 by 4.3 cm on image 51 compared to 3.3 x 3.7 cm previously. Left common iliac lymphadenopathy measures 2.2 cm on image 58 and shows no significant change in size. Pelvic iliac lymphadenopathy obstructing the distal left ureter currently measures 2.1 x 3.1 cm on image 77 compared to 2.0 x 2.6 cm previously. Right external iliac lymphadenopathy measures 2.2 cm on image 72 compared to 1.7 cm previously. Right obturator internus lymphadenopathy measures 1.4 cm on image 82 compared to 1.2 cm previously. Reproductive: Prior hysterectomy noted. Above-described pelvic fluid  collection involves the hysterectomy bed Other: None. Musculoskeletal:  No suspicious bone lesions identified. IMPRESSION: No significant change in large pelvic abscess measuring approximately 7 x 11.5 cm with percutaneous drainage catheter remaining in place. Overall mild progression of abdominal and pelvic lymphadenopathy, consistent with mild progression of metastatic disease. Mild increase in left hydroureteronephrosis due to metastatic lymphadenopathy in the left lower pelvis. Electronically Signed   By: Earle Gell M.D.   On: 08/11/2015 16:31    Scheduled Meds: . acetaminophen  1,000 mg Oral TID  . antiseptic oral rinse  7 mL Mouth Rinse BID  . feeding supplement  1 Container Oral TID BM  . insulin aspart  0-15 Units Subcutaneous 4 times per day  . nystatin   Topical BID  . piperacillin-tazobactam (ZOSYN)  IV  3.375 g Intravenous 3 times per day  . sodium chloride flush  3 mL Intravenous Q12H   Continuous Infusions: . Marland KitchenTPN (CLINIMIX-E) Adult 83 mL/hr at 08/10/15 1739   And  . fat emulsion 240 mL (08/10/15 1740)  . Marland KitchenTPN (CLINIMIX-E) Adult     And  . 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 If 7PM-7AM, please contact night-coverage at [email or web address], password [facility] 08/11/2015, 4:59 PM  LOS: 12 days

## 2015-08-11 NOTE — Progress Notes (Signed)
PARENTERAL NUTRITION CONSULT NOTE - INITIAL  Pharmacy Consult for TPN Indication: Perforated bowel; intolerant to enteral feeds  No Known Allergies  Patient Measurements: Height: 5' (152.4 cm) Weight: 213 lb 3 oz (96.7 kg) IBW/kg (Calculated) : 45.5 Adjusted Body Weight: 58.5 kg Usual Weight: ~98kg  Vital Signs: Temp: 99.6 F (37.6 C) (03/07 0455) Temp Source: Oral (03/07 0455) BP: 132/63 mmHg (03/07 0455) Pulse Rate: 86 (03/07 0455) Intake/Output from previous day: 03/06 0701 - 03/07 0700 In: 135 [P.O.:120] Out: 10 [Drains:10] Intake/Output from this shift:    Labs:  Recent Labs  08/10/15 0604  WBC 9.7  HGB 10.2*  HCT 33.1*  PLT 475*    Recent Labs  08/10/15 0604  NA 135  K 4.8  CL 104  CO2 24  GLUCOSE 119*  BUN 17  CREATININE 0.69  CALCIUM 8.4*  MG 2.3  PHOS 3.1  PROT 6.8  ALBUMIN 2.8*  AST 21  ALT 8*  ALKPHOS 70  BILITOT 1.3*  PREALBUMIN 13.7*  TRIG 115   Estimated Creatinine Clearance: 77.9 mL/min (by C-G formula based on Cr of 0.69).    Recent Labs  08/10/15 1743 08/10/15 2326 08/11/15 0506  GLUCAP 122* 125* 118*   Medical History: Past Medical History  Diagnosis Date  . Abnormal uterine bleeding   . Anemia   . Fibroid   . Heart murmur     under 60 yrs of age  . Cancer ([place]) dx'd 07/2014    endometrial stromal sarcoma  . Radiation 12/01/14-01/05/15    pelvis 45 gray  . Radiation 05/18/2015-06/05/15    T4 thoracic spine area 35 gray   Assessment: 60 yo F admitted 2/23 with abdominal pain and noted to have perforated bowel.  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  She has been on Zosyn since admission for IAI and Eraxis was added 2/28.   Clear liquid diet was attempted 2/25 however patient developed severe abdominal pain with nausea/vomiting.    Insulin Requirements  during previous day: on moderate SSI q6h (6 units of insulin in the past 24 hrs) - no hx DM  Current Nutrition: liquid diet - boost/Resource started on 3/6  IVF: none  Central access: implanted port 11/19/14 TPN start date: 08/05/15  ASSESSMENT                                                                                                          HPI: 60 yo F admitted 2/23 with abdominal pain and noted to have perforated bowel.  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 She has been on Zosyn since admission for IAI and Eraxis was added 2/28.   Clear liquid diet was attempted 2/25 however patient developed severe abdominal pain with nausea/vomiting.    Significant events:  3/1: IR consulted for drain adjustment (bulb changed) 3/3:  TPN advanced to goal rate 3/5: abscess drainage decreased, 85 ml/24 hr, + BM's 3/6: patient reported BM this morning and eating about ~75% of meals (not documented in [facility]), Resource started  Today:   Glucose (goal cbgs <150): all cbgs within goal  Electrolytes: lytes wnl on 3/6  Renal:  stable at patient's baseline  LFTs: TBili elevated but trending down (1.3).  Patient does not appear jaundiced.   TGs: 188 (3/2), 115 (3/6)  Prealbumin: 6.4 (3/2), 13.7 (3/6)  NUTRITIONAL GOALS                                                                                             Estimated Goals: 1750-2050  kCal/d, 90-120 Gm/d (1.2-2 Gm/Kg/day using AdjBW); >1.5L Fluid/day Clinimix E 5/15 at a goal rate of 83 ml/hr + 20% fat emulsion at 10 ml/hr to provide: 100g/day protein, 1894 Kcal/day.  PLAN                                                                                                                         At 1800 today:  Continue Clinimix E 5/15 at 83 ml/hr.  20% fat emulsion at 10 ml/hr.  TPN  to contain standard multivitamins and trace elements.  Continue moderate SSI q6h  TPN lab panels on Mondays & Thursdays.  F/u daily.  Dia Sitter, PharmD, BCPS 08/11/2015 8:26 AM

## 2015-08-11 NOTE — Progress Notes (Signed)
  Subjective: Complains of pain with voiding, thinks pain is now from her drain.  Her drain has a bloody purulent appearing drainage today.  I can't tell the volume from the bag, but more than 10 ml in it now.    Objective: Vital signs in last 24 hours: Temp:  [99.3 F (37.4 C)-100.1 F (37.8 C)] 99.6 F (37.6 C) (03/07 0455) Pulse Rate:  [86-91] 86 (03/07 0455) Resp:  [16-20] 20 (03/07 0455) BP: (116-132)/(57-63) 132/63 mmHg (03/07 0455) SpO2:  [99 %-100 %] 100 % (03/07 0455) Last BM Date: 08/10/15 120 PO recorded, Diet: clears/On TNA Drain 10 recorded TM 100.1, VSS NO labs  CT pending  Intake/Output from previous day: 03/06 0701 - 03/07 0700 In: 135 [P.O.:120] Out: 10 [Drains:10] Intake/Output this shift:    General appearance: alert, cooperative and no distress GI: soft tender to palpation right mid abdomen and over drain.  Lab Results:   Recent Labs  08/10/15 0604  WBC 9.7  HGB 10.2*  HCT 33.1*  PLT 475*    BMET  Recent Labs  08/10/15 0604  NA 135  K 4.8  CL 104  CO2 24  GLUCOSE 119*  BUN 17  CREATININE 0.69  CALCIUM 8.4*   PT/INR No results for input(s): LABPROT, INR in the last 72 hours.   Recent Labs Lab 08/05/15 0506 08/06/15 0513 08/10/15 0604  AST 11* 11* 21  ALT 10* 10* 8*  ALKPHOS 48 50 70  BILITOT 2.6* 1.4* 1.3*  PROT 6.2* 6.1* 6.8  ALBUMIN 2.5* 2.5* 2.8*     Lipase     Component Value Date/Time   LIPASE 18 07/30/2015 1251     Studies/Results: No results found.  Medications: . acetaminophen  1,000 mg Oral TID  . antiseptic oral rinse  7 mL Mouth Rinse BID  . feeding supplement  1 Container Oral TID BM  . insulin aspart  0-15 Units Subcutaneous 4 times per day  . nystatin   Topical BID  . piperacillin-tazobactam (ZOSYN)  IV  3.375 g Intravenous 3 times per day  . sodium chloride flush  3 mL Intravenous Q12H    Assessment/Plan Contained bowel perforation, vs tumor invasion of the bowel IR drain 07/31/15 -  culture: E coli Endometrial sarcoma,with bilateral pelvic node involvement/spinal & intraabdominal metastasis Hx of hysterectomy/nephrectomy [facility] 2016 -- chemo/radiation therapy/[facility] Vivien Rossetti, MD  Hx of gastric sleeve  Anemia secondary to chronic blood loss Body mass index is 41.6 malnutrition onTNA Antibiotics: Day 12 Zosyn DVT: SCD's added     Plan:  CT pending.  Recheck labs in AM. She could have heparin or Lovenox for DVT prophylaxis from our standpoint.  Defer to Medicine.  LOS: 12 days    Kameo Bains 08/11/2015

## 2015-08-11 NOTE — Progress Notes (Signed)
Patient ID: Erin Santiago, female   DOB: 01-18-1956, 60 y.o.   MRN: [id number]    Referring Physician(s): CCS  Supervising Physician: Sandi Mariscal  Chief Complaint: Right lower quadrant abscess    Subjective: Patient complaining of pain with voiding and bowel movements.Denies nausea or vomiting. Also notes some occasional temp elevations with sweats.   Allergies: Review of patient's allergies indicates no known allergies.  Medications: Prior to Admission medications   Medication Sig Start Date End Date Taking? Authorizing Provider  calcium carbonate (TUMS - DOSED IN MG ELEMENTAL CALCIUM) 500 MG chewable tablet Chew 2 tablets by mouth daily.    Yes Historical Provider, MD  dexamethasone (DECADRON) 4 MG tablet Take 2 tablets by mouth once a day on the day after chemotherapy and then take 2 tablets two times a day for 2 days. Take with food. 07/27/15  Yes Lennis Marion Downer, MD  docusate sodium (COLACE) 100 MG capsule Take 100 mg by mouth 2 (two) times daily as needed for mild constipation. Reported on 05/28/2015 07/17/14  Yes Historical Provider, MD  hydrocortisone 2.5 % cream Apply 1 application topically as needed (itch/psoriasis.). Reported on 07/17/2015 05/19/14  Yes Historical Provider, MD  lidocaine-prilocaine (EMLA) cream Apply to Porta-cath 1-2 hrs prior to access. Cover with [employer]. 08/18/14  Yes Lennis Marion Downer, MD  LORazepam (ATIVAN) 1 MG tablet Place 1/2 to 1 tablet under the tongue or swallow every 6 hours as needed for nausea 07/22/15  Yes Lennis P Livesay, MD  meloxicam (MOBIC) 15 MG tablet Take 15 mg by mouth daily.  06/09/14  Yes Historical Provider, MD  ondansetron (ZOFRAN) 8 MG tablet Take 1 tablet (8 mg total) by mouth every 8 (eight) hours as needed for nausea or vomiting. 07/22/15  Yes Lennis Marion Downer, MD  prochlorperazine (COMPAZINE) 10 MG tablet Take 1 tablet (10 mg total) by mouth every 6 (six) hours as needed (Nausea or vomiting). 07/27/15  Yes Lennis Marion Downer, MD    Psyllium 28.3 % POWD Take 1 packet by mouth daily as needed (for fiber). Reported on 05/26/2015   Yes Historical Provider, MD  Resveratrol 250 MG CAPS Take 250 mg by mouth 2 (two) times daily. Reported on 05/26/2015   Yes Historical Provider, MD  ferrous fumarate (HEMOCYTE) 325 (106 FE) MG TABS tablet Take 1 tab daily on an empty stomach with OJ or Vitamin C 500 mg Patient not taking: Reported on 07/09/2015 09/15/14   Lennis Marion Downer, MD  sucralfate (CARAFATE) 1 GM/10ML suspension Take 10 mLs (1 g total) by mouth 4 (four) times daily -  with meals and at bedtime. Patient not taking: Reported on 07/30/2015 05/20/15   [name], MD     Vital Signs: BP 132/63 mmHg  Pulse 86  Temp(Src) 99.6 F (37.6 C) (Oral)  Resp 20  Ht 5' (1.524 m)  Wt 213 lb 3 oz (96.7 kg)  BMI 41.63 kg/m2  SpO2 100%  LMP 02/04/2014  Physical Exam awake, alert. Right lower quadrant drain intact, insertion site mildly tender;output 10-20 mL of feculent fluid  Imaging: No results found.  Labs:  CBC:  Recent Labs  08/05/15 0506 08/06/15 0513 08/08/15 0620 08/10/15 0604  WBC 11.4* 10.7* 8.8 9.7  HGB 10.1* 10.2* 10.2* 10.2*  HCT 30.9* 31.3* 32.5* 33.1*  PLT 356 400 469* 475*    COAGS:  Recent Labs  08/26/14 1300 07/30/15 2242 07/31/15 0428  INR 0.95 1.49 1.41  APTT 27  --  32    BMP:  Recent Labs  08/05/15 0506 08/06/15 0513 08/08/15 0620 08/10/15 0604  NA 135 137 136 135  K 3.0* 3.5 4.0 4.8  CL 93* 98* 104 104  CO2 '27 31 25 24  '$ GLUCOSE 77 127* 125* 119*  BUN '6 8 13 17  '$ CALCIUM 8.1* 8.3* 8.4* 8.4*  CREATININE 0.80 0.52 0.56 0.69  GFRNONAA >60 >60 >60 >60  GFRAA >60 >60 >60 >60    LIVER FUNCTION TESTS:  Recent Labs  08/03/15 0425 08/05/15 0506 08/06/15 0513 08/10/15 0604  BILITOT 2.8* 2.6* 1.4* 1.3*  AST 11* 11* 11* 21  ALT 8* 10* 10* 8*  ALKPHOS 52 48 50 70  PROT 5.4* 6.2* 6.1* 6.8  ALBUMIN 2.3* 2.5* 2.5* 2.8*    Assessment and Plan: Met endom ca; s/p drainage of  RLQ abd abscess 2/24; temp 99.6; no new lab data ; for f/u CT today.   Electronically Signed: D. Rowe Robert 08/11/2015, 12:36 PM   I spent a total of 15 minutes at the the patient's bedside AND on the patient's hospital floor or unit, greater than 50% of which was counseling/coordinating care for RLQ drain

## 2015-08-11 NOTE — Progress Notes (Signed)
PT Cancellation Note  Patient Details Name: Erin Santiago MRN: [id number] DOB: November 25, 1955   Cancelled Treatment:     pt having increased ABD pain near drain site and scheduled for CT.  Pt wants to hold off on PT till tomorrow.     Nathanial Rancher 08/11/2015, 4:04 PM

## 2015-08-12 ENCOUNTER — Encounter (HOSPITAL_COMMUNITY): Payer: Self-pay | Admitting: Radiology

## 2015-08-12 ENCOUNTER — Inpatient Hospital Stay (HOSPITAL_COMMUNITY): Payer: Federal, State, Local not specified - PPO

## 2015-08-12 LAB — CBC
HEMATOCRIT: 29.6 % — AB (ref 36.0–46.0)
HEMOGLOBIN: 9.3 g/dL — AB (ref 12.0–15.0)
MCH: 27.2 pg (ref 26.0–34.0)
MCHC: 31.4 g/dL (ref 30.0–36.0)
MCV: 86.5 fL (ref 78.0–100.0)
Platelets: 410 10*3/uL — ABNORMAL HIGH (ref 150–400)
RBC: 3.42 MIL/uL — AB (ref 3.87–5.11)
RDW: 15 % (ref 11.5–15.5)
WBC: 9.2 10*3/uL (ref 4.0–10.5)

## 2015-08-12 LAB — PROTIME-INR
INR: 1.35 (ref 0.00–1.49)
Prothrombin Time: 16.8 seconds — ABNORMAL HIGH (ref 11.6–15.2)

## 2015-08-12 LAB — GLUCOSE, CAPILLARY
GLUCOSE-CAPILLARY: 120 mg/dL — AB (ref 65–99)
GLUCOSE-CAPILLARY: 111 mg/dL — AB (ref 65–99)
Glucose-Capillary: 124 mg/dL — ABNORMAL HIGH (ref 65–99)

## 2015-08-12 LAB — BASIC METABOLIC PANEL
ANION GAP: 11 (ref 5–15)
BUN: 17 mg/dL (ref 6–20)
CHLORIDE: 103 mmol/L (ref 101–111)
CO2: 26 mmol/L (ref 22–32)
Calcium: 8.9 mg/dL (ref 8.9–10.3)
Creatinine, Ser: 0.54 mg/dL (ref 0.44–1.00)
GFR calc non Af Amer: >60 mL/min (ref >60)
GLUCOSE: 129 mg/dL — AB (ref 65–99)
POTASSIUM: 4.1 mmol/L (ref 3.5–5.1)
Sodium: 140 mmol/L (ref 135–145)

## 2015-08-12 MED ORDER — TRACE MINERALS CR-CU-MN-SE-ZN 10-1000-500-60 MCG/ML IV SOLN: INTRAVENOUS | Status: DC

## 2015-08-12 MED ORDER — TRACE MINERALS CR-CU-MN-SE-ZN 10-1000-500-60 MCG/ML IV SOLN
INTRAVENOUS | Status: DC
  Filled 2015-08-12: qty 2280

## 2015-08-12 MED ORDER — FENTANYL CITRATE (PF) 100 MCG/2ML IJ SOLN
INTRAMUSCULAR | Status: AC | PRN
  Administered 2015-08-12 (×2): 50 ug via INTRAVENOUS

## 2015-08-12 MED ORDER — FAT EMULSION 20 % IV EMUL
240.0000 mL | INTRAVENOUS | Status: AC
Start: 2015-08-12 — End: 2015-08-13
  Administered 2015-08-12: 240 mL via INTRAVENOUS
  Filled 2015-08-12: qty 240

## 2015-08-12 MED ORDER — MIDAZOLAM HCL 2 MG/2ML IJ SOLN
INTRAMUSCULAR | Status: AC
  Filled 2015-08-12: qty 6

## 2015-08-12 MED ORDER — MIDAZOLAM HCL 2 MG/2ML IJ SOLN
INTRAMUSCULAR | Status: AC | PRN
  Administered 2015-08-12 (×4): 1 mg via INTRAVENOUS

## 2015-08-12 MED ORDER — FENTANYL CITRATE (PF) 100 MCG/2ML IJ SOLN
INTRAMUSCULAR | Status: AC
  Filled 2015-08-12: qty 4

## 2015-08-12 MED ORDER — FAT EMULSION 20 % IV EMUL: 240.0000 mL | INTRAVENOUS | Status: DC

## 2015-08-12 MED ORDER — TRACE MINERALS CR-CU-MN-SE-ZN 10-1000-500-60 MCG/ML IV SOLN
INTRAVENOUS | Status: AC
  Administered 2015-08-12: 19:00:00 via INTRAVENOUS
  Filled 2015-08-12: qty 1992

## 2015-08-12 MED ORDER — FAT EMULSION 20 % IV EMUL
240.0000 mL | INTRAVENOUS | Status: DC
  Filled 2015-08-12: qty 250

## 2015-08-12 NOTE — Procedures (Signed)
Technically successful CT guided placed of a 10 Fr drainage catheter placement into the pelvis via the left lower abdomen yielding 220 cc of purulent material.   All aspirated samples sent to the laboratory for analysis.   EBL: Minimal No immediate post procedural complications.   Ronny Bacon, MD Pager #: [phone]

## 2015-08-12 NOTE — Progress Notes (Addendum)
PARENTERAL NUTRITION CONSULT NOTE - INITIAL  Pharmacy Consult for TPN Indication: Perforated bowel; intolerant to enteral feeds  No Known Allergies  Patient Measurements: Height: 5' (152.4 cm) Weight: 213 lb 3 oz (96.7 kg) IBW/kg (Calculated) : 45.5 Adjusted Body Weight: 58.5 kg Usual Weight: ~98kg  Vital Signs: Temp: 98.6 F (37 C) (03/08 0514) Temp Source: Oral (03/08 0514) BP: 129/61 mmHg (03/08 0514) Pulse Rate: 73 (03/08 0514) Intake/Output from previous day: 03/07 0701 - 03/08 0700 In: 2568.6 [IV Piggyback:700; TPN:1858.6] Out: 150 [Drains:150] Intake/Output from this shift:    Labs:  Recent Labs  08/10/15 0604 08/12/15 0539  WBC 9.7 9.2  HGB 10.2* 9.3*  HCT 33.1* 29.6*  PLT 475* 410*  INR  --  1.35    Recent Labs  08/10/15 0604 08/12/15 0539  NA 135 140  K 4.8 4.1  CL 104 103  CO2 24 26  GLUCOSE 119* 129*  BUN 17 17  CREATININE 0.69 0.54  CALCIUM 8.4* 8.9  MG 2.3  --   PHOS 3.1  --   PROT 6.8  --   ALBUMIN 2.8*  --   AST 21  --   ALT 8*  --   ALKPHOS 70  --   BILITOT 1.3*  --   PREALBUMIN 13.7*  --   TRIG 115  --    Estimated Creatinine Clearance: 77.9 mL/min (by C-G formula based on Cr of 0.54).    Recent Labs  08/11/15 1712 08/11/15 2325 08/12/15 0517  GLUCAP 120* 139* 120*   Medical History: Past Medical History  Diagnosis Date  . Abnormal uterine bleeding   . Anemia   . Fibroid   . Heart murmur     under 5 yrs of age  . Cancer ([place]) dx'd 07/2014    endometrial stromal sarcoma  . Radiation 12/01/14-01/05/15    pelvis 45 gray  . Radiation 05/18/2015-06/05/15    T4 thoracic spine area 35 gray   Assessment: 60 yo F admitted 2/23 with abdominal pain and noted to have perforated bowel.  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 and hysterectomy and oophorectomy at [facility] in 2016.  She has been on Zosyn since admission for  IAI and Eraxis was added 2/28.   Clear liquid diet was attempted 2/25 however patient developed severe abdominal pain with nausea/vomiting.    Insulin Requirements during previous day: on moderate SSI q6h (2 units of insulin in the past 24 hrs) - no hx DM  Current Nutrition: liquid diet - boost/Resource TID started on 3/6  IVF: none  Central access: implanted port 11/19/14 TPN start date: 08/05/15  ASSESSMENT                                                                                                          HPI: 60 yo F admitted 2/23 with abdominal pain and noted to have perforated bowel.  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 She has been on Zosyn since admission for IAI and Eraxis was added 2/28.   Clear liquid diet was attempted 2/25 however patient developed severe abdominal pain with nausea/vomiting.    Significant events:  3/1: IR consulted for drain adjustment (bulb changed) 3/3: TPN advanced to goal rate 3/5: abscess drainage decreased, 85 ml/24 hr, + BM's 3/6: patient reported BM this morning and eating about ~75% of meals (not documented in [facility]), Resource started 3/7 abd CT: no signif change in pelvic abscess, metastatic lymphadenopathy in the left lower pelvis.  Today:   Glucose (goal cbgs <150): all cbgs <150  Electrolytes: lytes wnl   Renal:  stable at patient's baseline  LFTs: TBili elevated but trending down (1.3).  Patient does not appear jaundiced.   TGs: 188 (3/2), 115 (3/6)  Prealbumin: 6.4 (3/2), 13.7 (3/6)  NUTRITIONAL GOALS                                                                                             RD recs (3/8): Kcal: 1950-2150 (20-22 kcal/kg) Protein: 120-140 grams of protein (1.25-1.45 grams/kg) Fluid: 2 L/day  Clinimix E 5/15 at a goal rate of 83 ml/hr + 20% fat  emulsion at 10 ml/hr to provide: 100g/day protein, 1894 Kcal/day.  PLAN                                                                                                                         At 1800 today:   Continue Clinimix E 5/15 at 83 ml/hr   20% fat emulsion at 10 ml/hr.  TPN to contain standard multivitamins and trace elements.  Continue moderate SSI q6h  TPN lab panels on Mondays & Thursdays.  F/u daily.  Dia Sitter, PharmD, BCPS 08/12/2015 7:46 AM

## 2015-08-12 NOTE — Progress Notes (Signed)
PT Cancellation Note  Patient Details Name: Kierstyn Yeung MRN: [id number] DOB: [birth year]   Cancelled Treatment:     Pt declined stating "they are putting a second drain in today".  Will re attempt another day.    Nathanial Rancher 08/12/2015, 12:50 PM

## 2015-08-12 NOTE — Progress Notes (Signed)
Patient ID: Erin Santiago, female   DOB: [birth year], 60 y.o.   MRN: [id number]    Referring Physician(s): CCS  Supervising Physician: Sandi Mariscal  Chief Complaint: Right lower quadrant abscess   Subjective:  Pt feeling about the same; still with abd/pelvic discomfort, occ nausea, incontinent of stool  Allergies: Review of patient's allergies indicates no known allergies.  Medications: Prior to Admission medications   Medication Sig Start Date End Date Taking? Authorizing Provider  calcium carbonate (TUMS - DOSED IN MG ELEMENTAL CALCIUM) 500 MG chewable tablet Chew 2 tablets by mouth daily.    Yes Historical Provider, MD  dexamethasone (DECADRON) 4 MG tablet Take 2 tablets by mouth once a day on the day after chemotherapy and then take 2 tablets two times a day for 2 days. Take with food. 07/27/15  Yes Lennis Marion Downer, MD  docusate sodium (COLACE) 100 MG capsule Take 100 mg by mouth 2 (two) times daily as needed for mild constipation. Reported on 05/28/2015 07/17/14  Yes Historical Provider, MD  hydrocortisone 2.5 % cream Apply 1 application topically as needed (itch/psoriasis.). Reported on 07/17/2015 05/19/14  Yes Historical Provider, MD  lidocaine-prilocaine (EMLA) cream Apply to Porta-cath 1-2 hrs prior to access. Cover with [employer]. 08/18/14  Yes Lennis Marion Downer, MD  LORazepam (ATIVAN) 1 MG tablet Place 1/2 to 1 tablet under the tongue or swallow every 6 hours as needed for nausea 07/22/15  Yes Lennis P Livesay, MD  meloxicam (MOBIC) 15 MG tablet Take 15 mg by mouth daily.  06/09/14  Yes Historical Provider, MD  ondansetron (ZOFRAN) 8 MG tablet Take 1 tablet (8 mg total) by mouth every 8 (eight) hours as needed for nausea or vomiting. 07/22/15  Yes Lennis Marion Downer, MD  prochlorperazine (COMPAZINE) 10 MG tablet Take 1 tablet (10 mg total) by mouth every 6 (six) hours as needed (Nausea or vomiting). 07/27/15  Yes Lennis Marion Downer, MD  Psyllium 28.3 % POWD Take 1 packet by mouth daily as  needed (for fiber). Reported on 05/26/2015   Yes Historical Provider, MD  Resveratrol 250 MG CAPS Take 250 mg by mouth 2 (two) times daily. Reported on 05/26/2015   Yes Historical Provider, MD  ferrous fumarate (HEMOCYTE) 325 (106 FE) MG TABS tablet Take 1 tab daily on an empty stomach with OJ or Vitamin C 500 mg Patient not taking: Reported on 07/09/2015 09/15/14   Lennis Marion Downer, MD  sucralfate (CARAFATE) 1 GM/10ML suspension Take 10 mLs (1 g total) by mouth 4 (four) times daily -  with meals and at bedtime. Patient not taking: Reported on 07/30/2015 05/20/15   [name], MD     Vital Signs: BP 129/61 mmHg  Pulse 73  Temp(Src) 98.6 F (37 C) (Oral)  Resp 20  Ht 5' (1.524 m)  Wt 213 lb 3 oz (96.7 kg)  BMI 41.63 kg/m2  SpO2 98%  LMP 02/04/2014  Physical Exam awake/alert; RLQ drain intact, output 150 cc feculent fluid; insertion site ok, mildly tender; chest- CTA bilat ant; heart- RRR  Imaging: Ct Abdomen Pelvis W Contrast  08/11/2015  CLINICAL DATA:  Followup endometrial stromal sarcoma and abscess. Recently began chemotherapy. Previous radiation therapy. EXAM: CT ABDOMEN AND PELVIS WITH CONTRAST TECHNIQUE: Multidetector CT imaging of the abdomen and pelvis was performed using the standard protocol following bolus administration of intravenous contrast. CONTRAST:  119m OMNIPAQUE IOHEXOL 300 MG/ML  SOLN COMPARISON:  08/04/2015 FINDINGS: Lower chest: Bibasilar atelectasis shows improvement since previous study. Mild cardiomegaly is stable.  Hepatobiliary: No masses or other significant abnormality. Gallbladder is unremarkable. Pancreas: No mass, inflammatory changes, or other significant abnormality. Spleen: Within normal limits in size and appearance. Adrenals/Urinary Tract: No adrenal or renal masses identified. No evidence of right-sided hydronephrosis. Mild left hydronephrosis and ureterectasis shows mild increase due to left iliac pelvic lymphadenopathy which is mildly increased since  previous study. 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 Left colonic diverticulosis is again demonstrated, without evidence of diverticulitis. No evidence of bowel obstruction. A left lower quadrant percutaneous drainage catheter remains in place within the anterior portion of a pelvic fluid collection. This pelvic fluid collection currently measures 7.0 x 11.5 cm and is not simply changed in size when measured similarly on previous study. This is consistent with abscess. Vascular/Lymphatic: Portacaval lymph node on image 32 measures 1.4 cm and is not significant changed. Aorto-caval lymphadenopathy shows increase in size, currently measuring 3.4 by 4.3 cm on image 51 compared to 3.3 x 3.7 cm previously. Left common iliac lymphadenopathy measures 2.2 cm on image 58 and shows no significant change in size. Pelvic iliac lymphadenopathy obstructing the distal left ureter currently measures 2.1 x 3.1 cm on image 77 compared to 2.0 x 2.6 cm previously. Right external iliac lymphadenopathy measures 2.2 cm on image 72 compared to 1.7 cm previously. Right obturator internus lymphadenopathy measures 1.4 cm on image 82 compared to 1.2 cm previously. Reproductive: Prior hysterectomy noted. Above-described pelvic fluid collection involves the hysterectomy bed Other: None. Musculoskeletal:  No suspicious bone lesions identified. IMPRESSION: No significant change in large pelvic abscess measuring approximately 7 x 11.5 cm with percutaneous drainage catheter remaining in place. Overall mild progression of abdominal and pelvic lymphadenopathy, consistent with mild progression of metastatic disease. Mild increase in left hydroureteronephrosis due to metastatic lymphadenopathy in the left lower pelvis. Electronically Signed   By: Earle Gell M.D.   On: 08/11/2015 16:31    Labs:  CBC:  Recent Labs  08/06/15 0513 08/08/15 0620 08/10/15 0604 08/12/15 0539  WBC 10.7* 8.8 9.7 9.2  HGB 10.2*  10.2* 10.2* 9.3*  HCT 31.3* 32.5* 33.1* 29.6*  PLT 400 469* 475* 410*    COAGS:  Recent Labs  08/26/14 1300 07/30/15 2242 07/31/15 0428 08/12/15 0539  INR 0.95 1.49 1.41 1.35  APTT 27  --  32  --     BMP:  Recent Labs  08/06/15 0513 08/08/15 0620 08/10/15 0604 08/12/15 0539  NA 137 136 135 140  K 3.5 4.0 4.8 4.1  CL 98* 104 104 103  CO2 '31 25 24 26  '$ GLUCOSE 127* 125* 119* 129*  BUN '8 13 17 17  '$ CALCIUM 8.3* 8.4* 8.4* 8.9  CREATININE 0.52 0.56 0.69 0.54  GFRNONAA >60 >60 >60 >60  GFRAA >60 >60 >60 >60    LIVER FUNCTION TESTS:  Recent Labs  08/03/15 0425 08/05/15 0506 08/06/15 0513 08/10/15 0604  BILITOT 2.8* 2.6* 1.4* 1.3*  AST 11* 11* 11* 21  ALT 8* 10* 10* 8*  ALKPHOS 52 48 50 70  PROT 5.4* 6.2* 6.1* 6.8  ALBUMIN 2.3* 2.5* 2.5* 2.8*    Assessment and Plan: Met endom ca; s/p drainage of RLQ abd abscess 2/24; AF; WBC nl; hgb 9.3, creat 0.54; latest CT A/P reveals  persistent pelvic abscess, mild progression of abd/pelvic lymphadenopathy/left hydroureteronephrosis. Images reviewed by Dr. Watts/d/w CCS. Plan is for CT guided placement of new pelvic/TG drain today, poss manipulation of existing drain. Above d/w pt with her understanding and consent.  Electronically Signed: D. Rowe Robert 08/12/2015, 1:48 PM   I spent a total of 15 minutes at the the patient's bedside AND on the patient's hospital floor or unit, greater than 50% of which was counseling/coordinating care for RLQ drain, placement of new pelvic drain

## 2015-08-12 NOTE — Sedation Documentation (Signed)
Patient denies pain and is resting comfortably.  

## 2015-08-12 NOTE — Progress Notes (Signed)
[facility] PROGRESS NOTE  Erin Santiago [id number] DOB: 07/12/1955 DOA: 07/30/2015 PCP: Merrilee Seashore, MD  Brief interval history  Erin Santiago is a 60 y.o. 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 Patient is status post chemotherapy and radiation to the pelvis and back and was due to start chemotherapy on the day of admission due to progression of disease involving the left abdominal pain. Presented to the ED with complaints of 1-2 day history of diffuse abdominal pain. CT abdomen and pelvis showed bowel perforation. Large 9 x 4.5 cm complex right lower quadrant and pelvic abscess containing a small amount of contrast suggesting communication with the distal ileum. general surgery consulted recommended initially nonoperative percutaneous drainage.She was placed on bowel rest and monitored. Interventional radiology was also consulted for drain placement. Patient had a percutaneous drain placed on 07/31/2015 and was placed empirically on IV Zosyn from admission. Wound/abscess cultures grew out Escherichia coli. Started on clears, Repeat CT abdomen and pelvis pending for 08/04/2015  Assessment/Plan: #1 bowel perforation, complex intra-abdominal fluid collection 9 x 4.5 cm right lower quadrant and pelvic -likely due to tumor invasion -Per CT abdomen and pelvis appears that perforation may have been walled off and contained.  - general surgery folowing, recommending nonoperative percutaneous drainage placement. S/p RLQ percutaneous drain 07/31/2015 per IR.  Wound/abscess cultures growing Escherichia coli, this organism was susceptible. She has been on IV Zosyn.  -Gen. surgery and interventional radiology following. -Blood cultures obtained on 07/30/2015 show no growth 2  -She remains on TNA -On 08/10/2015 she reported some abdominal pain and  bloating with soft diet. She was placed back on clears.  -On 08/11/2015 repeat CT scan of abdomen and pelvis did not reveal significant change in large pelvic abscess per radiology, measuring 7 x 11.5 cm. Percutaneous drainage catheter in place.  -per CCS and IR  #2 history of iron deficiency anemia -Stable, monitor periodically  #3 dehydration/metabolic acidosis -Resolving. She was treated with IV fluids.   #4 hypokalemia -Potassium improved   #5 lactic acidosis Likely secondary to problem #1 and dehydration.   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 Patient status post chemotherapy and radiation to the pelvis. Patient was to start chemotherapy on the day of admission due to progression of disease involving the left, iliac adenopathy, 2 masses in the right lower quadrant seen on CT scan on 07/21/2015.   -She had a repeat CT scan on 08/11/2015 that revealed mild progression of abdominal pelvic lymphadenopathy consistent with progression of metastatic disease. -Radiology also revealed increase in mild hydronephrosis due to metastatic lymphadenopathy. Renal function remains stable.  -Dr. Marko Plume of medical oncology following, overall prognosis is poor  #7 prophylaxis SCDs for DVT prophylaxis.  Code Status: Full Family Communication: Updated patient. No family at bedside. Disposition Plan: not ready for discharge   Consultants:  Gen. surgery: Dr. Excell Seltzer 07/30/2015   oncology: Dr. Marko Plume 07/31/2015  Interventional radiology  Procedures:  CT abdomen and pelvis 07/30/2015  Acute abdominal series 07/30/2015  Right lower quadrant abdominal abscess drain with CT 07/31/2015 for Dr. Annamaria Boots  Antibiotics:  IV Zosyn 07/30/2015  HPI/Subjective: Patient sitting at bedside chair, she reports abdominal pain bowel movements. She is on  clears  Objective: Filed Vitals:   08/11/15 2056 08/12/15 0514  BP: 112/62 129/61  Pulse:  82 73  Temp: 99.2 F (37.3 C) 98.6 F (37 C)  Resp: 18 20    Intake/Output Summary (Last 24 hours) at 08/12/15 1341 Last data filed at 08/12/15 0514  Gross per 24 hour  Intake 2568.62 ml  Output    150 ml  Net 2418.62 ml   Filed Weights   08/06/15 0500 08/07/15 0500 08/07/15 1536  Weight: 97.2 kg (214 lb 4.6 oz) 97.07 kg (214 lb) 96.7 kg (213 lb 3 oz)    Exam:   General:  NAD, sitting at bedside chair, nontoxic  Cardiovascular: RRR  Respiratory: Normal respiratory effort, lungs are clear to auscultation bilaterally  Abdomen: Soft, nontender, nondistended. JP drain in place  Musculoskeletal: No clubbing cyanosis or edema.   Data Reviewed: Basic Metabolic Panel:  Recent Labs Lab 08/06/15 0513 08/08/15 0620 08/10/15 0604 08/12/15 0539  NA 137 136 135 140  K 3.5 4.0 4.8 4.1  CL 98* 104 104 103  CO2 31 25 24 26   GLUCOSE 127* 125* 119* 129*  BUN 8 13 17 17   CREATININE 0.52 0.56 0.69 0.54  CALCIUM 8.3* 8.4* 8.4* 8.9  MG 1.9 2.2 2.3  --   PHOS 3.1 3.5 3.1  --    Liver Function Tests:  Recent Labs Lab 08/06/15 0513 08/10/15 0604  AST 11* 21  ALT 10* 8*  ALKPHOS 50 70  BILITOT 1.4* 1.3*  PROT 6.1* 6.8  ALBUMIN 2.5* 2.8*   No results for input(s): LIPASE, AMYLASE in the last 168 hours. No results for input(s): AMMONIA in the last 168 hours. CBC:  Recent Labs Lab 08/06/15 0513 08/08/15 0620 08/10/15 0604 08/12/15 0539  WBC 10.7* 8.8 9.7 9.2  NEUTROABS 8.7*  --  8.0*  --   HGB 10.2* 10.2* 10.2* 9.3*  HCT 31.3* 32.5* 33.1* 29.6*  MCV 85.1 87.1 87.3 86.5  PLT 400 469* 475* 410*   Cardiac Enzymes: No results for input(s): CKTOTAL, CKMB, CKMBINDEX, TROPONINI in the last 168 hours. BNP (last 3 results) No results for input(s): BNP in the last 8760 hours.  ProBNP (last 3 results) No results for input(s): PROBNP in the last 8760 hours.  CBG:  Recent  Labs Lab 08/11/15 1208 08/11/15 1712 08/11/15 2325 08/12/15 0517 08/12/15 1156  GLUCAP 138* 120* 139* 120* 111*    Recent Results (from the past 240 hour(s))  Culture, Urine     Status: None   Collection Time: 08/04/15 12:24 PM  Result Value Ref Range Status   Specimen Description URINE, RANDOM  Final   Special Requests NONE  Final   Culture   Final    NO GROWTH 1 DAY Performed at [facility]    Report Status 08/05/2015 FINAL  Final  Body fluid culture     Status: None   Collection Time: 08/04/15  6:30 PM  Result Value Ref Range Status   Specimen Description ABDOMEN PERFORATED BOWEL  Final   Special Requests NONE  Final   Gram Stain   Final    RARE WBC PRESENT, PREDOMINANTLY MONONUCLEAR GRAM NEGATIVE RODS GRAM POSITIVE COCCI IN CHAINS IN PAIRS GRAM POSITIVE RODS    Culture   Final    MULTIPLE ORGANISMS PRESENT, NONE PREDOMINANT Performed at [facility]    Report Status 08/09/2015 FINAL  Final     Studies: Ct Abdomen Pelvis W Contrast  08/11/2015  CLINICAL DATA:  Followup endometrial stromal sarcoma and abscess. Recently began chemotherapy. Previous radiation therapy. EXAM: CT ABDOMEN AND PELVIS WITH CONTRAST TECHNIQUE:  Multidetector CT imaging of the abdomen and pelvis was performed using the standard protocol following bolus administration of intravenous contrast. CONTRAST:  123mL OMNIPAQUE IOHEXOL 300 MG/ML  SOLN COMPARISON:  08/04/2015 FINDINGS: Lower chest: Bibasilar atelectasis shows improvement since previous study. Mild cardiomegaly is stable. Hepatobiliary: No masses or other significant abnormality. Gallbladder is unremarkable. Pancreas: No mass, inflammatory changes, or other significant abnormality. Spleen: Within normal limits in size and appearance. Adrenals/Urinary Tract: No adrenal or renal masses identified. No evidence of right-sided hydronephrosis. Mild left hydronephrosis and ureterectasis shows mild increase due to left iliac pelvic  lymphadenopathy which is mildly increased since previous study. 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 Left colonic diverticulosis is again demonstrated, without evidence of diverticulitis. No evidence of bowel obstruction. A left lower quadrant percutaneous drainage catheter remains in place within the anterior portion of a pelvic fluid collection. This pelvic fluid collection currently measures 7.0 x 11.5 cm and is not simply changed in size when measured similarly on previous study. This is consistent with abscess. Vascular/Lymphatic: Portacaval lymph node on image 32 measures 1.4 cm and is not significant changed. Aorto-caval lymphadenopathy shows increase in size, currently measuring 3.4 by 4.3 cm on image 51 compared to 3.3 x 3.7 cm previously. Left common iliac lymphadenopathy measures 2.2 cm on image 58 and shows no significant change in size. Pelvic iliac lymphadenopathy obstructing the distal left ureter currently measures 2.1 x 3.1 cm on image 77 compared to 2.0 x 2.6 cm previously. Right external iliac lymphadenopathy measures 2.2 cm on image 72 compared to 1.7 cm previously. Right obturator internus lymphadenopathy measures 1.4 cm on image 82 compared to 1.2 cm previously. Reproductive: Prior hysterectomy noted. Above-described pelvic fluid collection involves the hysterectomy bed Other: None. Musculoskeletal:  No suspicious bone lesions identified. IMPRESSION: No significant change in large pelvic abscess measuring approximately 7 x 11.5 cm with percutaneous drainage catheter remaining in place. Overall mild progression of abdominal and pelvic lymphadenopathy, consistent with mild progression of metastatic disease. Mild increase in left hydroureteronephrosis due to metastatic lymphadenopathy in the left lower pelvis. Electronically Signed   By: Earle Gell M.D.   On: 08/11/2015 16:31    Scheduled Meds: . acetaminophen  1,000 mg Oral TID  . antiseptic oral rinse   7 mL Mouth Rinse BID  . feeding supplement  1 Container Oral TID BM  . insulin aspart  0-15 Units Subcutaneous 4 times per day  . nystatin   Topical BID  . piperacillin-tazobactam (ZOSYN)  IV  3.375 g Intravenous 3 times per day  . sodium chloride flush  3 mL Intravenous Q12H   Continuous Infusions: . Marland KitchenTPN (CLINIMIX-E) Adult 83 mL/hr at 08/11/15 1807   And  . fat emulsion 240 mL (08/11/15 1807)  . Marland KitchenTPN (CLINIMIX-E) Adult     And  . 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 If 7PM-7AM, please contact night-coverage at [email or web address], password [facility] 08/12/2015, 1:41 PM  LOS: 13 days

## 2015-08-12 NOTE — Progress Notes (Signed)
Subjective: Complaining of abdominal pain and waiting on new drain from IR.  Currently the drain bag is empty.   Objective: Vital signs in last 24 hours: Temp:  [98.6 F (37 C)-99.2 F (37.3 C)] 98.6 F (37 C) (03/08 0514) Pulse Rate:  [73-82] 73 (03/08 0514) Resp:  [18-20] 20 (03/08 0514) BP: (112-129)/(61-62) 129/61 mmHg (03/08 0514) SpO2:  [97 %-98 %] 98 % (03/08 0514) Last BM Date: 08/10/15 Clear diet/TNA Drain 150 Tm 100.1, VSS Labs OK CT scan:  Shows the current drain site is stable.  There is a new fluid collection that IR plans to drain.  Intake/Output from previous day: 03/07 0701 - 03/08 0700 In: 2568.6 [IV Piggyback:700; TPN:1858.6] Out: 150 [Drains:150] Intake/Output this shift:    General appearance: alert, cooperative and no distress GI: soft, some mid abdominal tenderness.  Lab Results:   Recent Labs  08/10/15 0604 08/12/15 0539  WBC 9.7 9.2  HGB 10.2* 9.3*  HCT 33.1* 29.6*  PLT 475* 410*    BMET  Recent Labs  08/10/15 0604 08/12/15 0539  NA 135 140  K 4.8 4.1  CL 104 103  CO2 24 26  GLUCOSE 119* 129*  BUN 17 17  CREATININE 0.69 0.54  CALCIUM 8.4* 8.9   PT/INR  Recent Labs  08/12/15 0539  LABPROT 16.8*  INR 1.35     Recent Labs Lab 08/06/15 0513 08/10/15 0604  AST 11* 21  ALT 10* 8*  ALKPHOS 50 70  BILITOT 1.4* 1.3*  PROT 6.1* 6.8  ALBUMIN 2.5* 2.8*     Lipase     Component Value Date/Time   LIPASE 18 07/30/2015 1251     Studies/Results: Ct Abdomen Pelvis W Contrast  08/11/2015  CLINICAL DATA:  Followup endometrial stromal sarcoma and abscess. Recently began chemotherapy. Previous radiation therapy. EXAM: CT ABDOMEN AND PELVIS WITH CONTRAST TECHNIQUE: Multidetector CT imaging of the abdomen and pelvis was performed using the standard protocol following bolus administration of intravenous contrast. CONTRAST:  168mL OMNIPAQUE IOHEXOL 300 MG/ML  SOLN COMPARISON:  08/04/2015 FINDINGS: Lower chest: Bibasilar  atelectasis shows improvement since previous study. Mild cardiomegaly is stable. Hepatobiliary: No masses or other significant abnormality. Gallbladder is unremarkable. Pancreas: No mass, inflammatory changes, or other significant abnormality. Spleen: Within normal limits in size and appearance. Adrenals/Urinary Tract: No adrenal or renal masses identified. No evidence of right-sided hydronephrosis. Mild left hydronephrosis and ureterectasis shows mild increase due to left iliac pelvic lymphadenopathy which is mildly increased since previous study. 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 Left colonic diverticulosis is again demonstrated, without evidence of diverticulitis. No evidence of bowel obstruction. A left lower quadrant percutaneous drainage catheter remains in place within the anterior portion of a pelvic fluid collection. This pelvic fluid collection currently measures 7.0 x 11.5 cm and is not simply changed in size when measured similarly on previous study. This is consistent with abscess. Vascular/Lymphatic: Portacaval lymph node on image 32 measures 1.4 cm and is not significant changed. Aorto-caval lymphadenopathy shows increase in size, currently measuring 3.4 by 4.3 cm on image 51 compared to 3.3 x 3.7 cm previously. Left common iliac lymphadenopathy measures 2.2 cm on image 58 and shows no significant change in size. Pelvic iliac lymphadenopathy obstructing the distal left ureter currently measures 2.1 x 3.1 cm on image 77 compared to 2.0 x 2.6 cm previously. Right external iliac lymphadenopathy measures 2.2 cm on image 72 compared to 1.7 cm previously. Right obturator internus lymphadenopathy measures 1.4 cm on image  82 compared to 1.2 cm previously. Reproductive: Prior hysterectomy noted. Above-described pelvic fluid collection involves the hysterectomy bed Other: None. Musculoskeletal:  No suspicious bone lesions identified. IMPRESSION: No significant change in  large pelvic abscess measuring approximately 7 x 11.5 cm with percutaneous drainage catheter remaining in place. Overall mild progression of abdominal and pelvic lymphadenopathy, consistent with mild progression of metastatic disease. Mild increase in left hydroureteronephrosis due to metastatic lymphadenopathy in the left lower pelvis. Electronically Signed   By: Earle Gell M.D.   On: 08/11/2015 16:31    Medications: . acetaminophen  1,000 mg Oral TID  . antiseptic oral rinse  7 mL Mouth Rinse BID  . feeding supplement  1 Container Oral TID BM  . insulin aspart  0-15 Units Subcutaneous 4 times per day  . nystatin   Topical BID  . piperacillin-tazobactam (ZOSYN)  IV  3.375 g Intravenous 3 times per day  . sodium chloride flush  3 mL Intravenous Q12H   . Marland KitchenTPN (CLINIMIX-E) Adult 83 mL/hr at 08/11/15 1807   And  . fat emulsion 240 mL (08/11/15 1807)  . Marland KitchenTPN (CLINIMIX-E) Adult     And  . 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   LOS: 13 days    Erin Santiago 08/12/2015

## 2015-08-12 NOTE — Progress Notes (Signed)
Nutrition Follow-up  DOCUMENTATION CODES:   Not applicable  INTERVENTION:  - Continue TPN per pharmacy - Diet advancement as medically feasible  - Will continue Boost Breeze TID for when pt feels ready to consume this supplement - RD will continue to monitor for needs  NUTRITION DIAGNOSIS:   Inadequate oral intake related to altered GI function as evidenced by per patient/family report. 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 Patient had presented to the ED with complaints of 1-2 day history of diffuse abdominal pain.   3/8 Continues with no new weight since 08/07/15. Nutrition needs were adjusted at previous RD visit and remain appropriate at this time. Diet was downgraded to CLD on 3/6 based on pt report of symptoms with solid foods as outlined below. Pt has been NPO since midnight for, per her report, a second IR drain to be placed today. Pt is excited about this and feels it will provide additional comfort. She is experiencing abdominal pressure this AM and states "diarrhea is just running out of me." She states that she has been nauseated with all intakes, including water. Pt drank 50% of Boost Breeze yesterday PM and states emesis ~1 hour after intake. Asked pt if she would like supplement d/c'ed and she requests order remain in place for when she feels she is able to tolerate this item.  Pt currently receiving Clinimix E 5/15 @ 83 mL/hr with 20% lipids @ 10 mL/hr which is providing 100 grams of protein (83% minimum estimated protein needs) and  1894 kcal (97% minimum estimated kcal needs).  Not fully meeting protein needs with current TPN regimen. Will monitor for diet advancement and further nutrition-related needs. Medications reviewed. Labs reviewed; CBGs: 118-139 mg/dL since yesterday (3/7) AM.     3/6 - Diet changes as follows from previous assessment:    3/3 @ 1140: CLD    3/4 @ 0807: FLD    3/4 @ 9735: CLD    3/4 @ 1058: Dysphagia 1, thin liquids - Per chart review, pt consumed 0% of dinner 3/4 and 0% of breakfast yesterday (3/5). - Pt reports she has been experiencing constipation and she plans to discuss this with MD today.  - She states that she experiences constipation and associated abdominal pain and pressure with intakes of solid foods but does not experience these symptoms with intakes of liquids.  - Pt states she has been trying to consume fluids throughout the day; encouraged her to continue with this.  - Nutrition needs adjusted based on weight/BMI, medical hx.  - Pt currently receiving Clinimix E 5/15 @ 83 mL/hr with 20% lipids @ 10 mL/hr which is providing 100 grams of protein (83% minimum estimated protein needs), 1894 kcal (97% minimum estimated kcal needs) from TPN regimen.  - No new weight recorded since 08/07/15. - Pt meeting kcal needs and likely >/= 90% estimated protein needs from TPN regimen and minimal PO intakes.   3/3 - Pt continues to be NPO, with sips of CLD from time to time.  - She complains of thirst, but no  nausea/vomiting.  - Less abd pain than previous visit. - Per surgery, they will discuss with Dr. Johney Maine if patient will begin CLD.  - Plan per pharmacy:  At 1800 today:  Increase Clinimix E5/15 to 83 ml/hr.  20% fat emulsion at 10 ml/hr.  Clinimix E 5/15 @ 83 with 20% lipids @ 10 mL/hr will provide 1894 calories (100% of needs), 100g protein (83.3% of estimated needs) Per MD note, will tx to [place]  3/1 - Pt with a bowel perforation, possibly secondary to tumor invasion into  her bowel. - Per PA note, 469m have been drained from R L quadrant and pelvis, via IR. - She is to undergo follow up CT today. - Spoke with pt at bedside briefly.  - RD consulted for new TPN.  - She endorses a good appetite, no weight loss.  - Stated her eating habits are erratic but she normally eats 3 solid meals per day. - She complains of being unable to taste sweet foods s/p her first round of chemo.  - She said her ability to taste sweets did not return for 6 months. - She was supposed to start chemo upon admission, but has been held for now. - Plan per pharmacy:  At 1800 today:  Start Clinimix E5/15 at 472mhr.  20% fat emulsion at 10 ml/hr.  Plan to advance as tolerated to goal rate [id number]   Diet Order:  .TPN (CLINIMIX-E) Adult Diet NPO time specified Except for: Sips with Meds  Skin:  Wound (see comment) (R abdominal incision for IR drain (07/31/15))  Last BM:  3/6  Height:   Ht Readings from Last 1 Encounters:  08/07/15 5' (1.524 m)    Weight:   Wt Readings from Last 1 Encounters:  08/07/15 213 lb 3 oz (96.7 kg)    Ideal Body Weight:  45.45 kg  BMI:  Body mass index is 41.63 kg/(m^2).  Estimated Nutritional Needs:   Kcal:  1950-2150 (20-22 kcal/kg)  Protein:  120-140 grams of protein (1.25-1.45 grams/kg)  Fluid:  2 L/day  EDUCATION NEEDS:   No education needs identified at this time     JesJarome MatinD, LDN Inpatient Clinical Dietitian Pager # [phone] hours/weekend pager # [phone]

## 2015-08-13 ENCOUNTER — Ambulatory Visit: Payer: Federal, State, Local not specified - PPO

## 2015-08-13 ENCOUNTER — Other Ambulatory Visit: Payer: Federal, State, Local not specified - PPO

## 2015-08-13 DIAGNOSIS — N74 Female pelvic inflammatory disorders in diseases classified elsewhere: Secondary | ICD-10-CM

## 2015-08-13 LAB — CBC
HEMATOCRIT: 30.7 % — AB (ref 36.0–46.0)
Hemoglobin: 9.7 g/dL — ABNORMAL LOW (ref 12.0–15.0)
MCH: 27.3 pg (ref 26.0–34.0)
MCHC: 31.6 g/dL (ref 30.0–36.0)
MCV: 86.5 fL (ref 78.0–100.0)
PLATELETS: 465 10*3/uL — AB (ref 150–400)
RBC: 3.55 MIL/uL — AB (ref 3.87–5.11)
RDW: 15 % (ref 11.5–15.5)
WBC: 9.5 10*3/uL (ref 4.0–10.5)

## 2015-08-13 LAB — GLUCOSE, CAPILLARY
GLUCOSE-CAPILLARY: 144 mg/dL — AB (ref 65–99)
GLUCOSE-CAPILLARY: 87 mg/dL (ref 65–99)
Glucose-Capillary: 122 mg/dL — ABNORMAL HIGH (ref 65–99)
Glucose-Capillary: 123 mg/dL — ABNORMAL HIGH (ref 65–99)

## 2015-08-13 LAB — COMPREHENSIVE METABOLIC PANEL
ALBUMIN: 2.7 g/dL — AB (ref 3.5–5.0)
ALK PHOS: 101 U/L (ref 38–126)
ALT: 11 U/L — ABNORMAL LOW (ref 14–54)
ANION GAP: 10 (ref 5–15)
AST: 13 U/L — ABNORMAL LOW (ref 15–41)
BUN: 18 mg/dL (ref 6–20)
CALCIUM: 8.8 mg/dL — AB (ref 8.9–10.3)
CO2: 25 mmol/L (ref 22–32)
Chloride: 104 mmol/L (ref 101–111)
Creatinine, Ser: 0.67 mg/dL (ref 0.44–1.00)
GFR calc non Af Amer: >60 mL/min (ref >60)
GLUCOSE: 113 mg/dL — AB (ref 65–99)
POTASSIUM: 3.9 mmol/L (ref 3.5–5.1)
SODIUM: 139 mmol/L (ref 135–145)
Total Bilirubin: 1 mg/dL (ref 0.3–1.2)
Total Protein: 7.1 g/dL (ref 6.5–8.1)

## 2015-08-13 LAB — PHOSPHORUS: PHOSPHORUS: 3.3 mg/dL (ref 2.5–4.6)

## 2015-08-13 LAB — MAGNESIUM: Magnesium: 2 mg/dL (ref 1.7–2.4)

## 2015-08-13 MED ORDER — TRACE MINERALS CR-CU-MN-SE-ZN 10-1000-500-60 MCG/ML IV SOLN
INTRAVENOUS | Status: AC
  Administered 2015-08-13: 18:00:00 via INTRAVENOUS
  Filled 2015-08-13: qty 1992

## 2015-08-13 MED ORDER — NYSTATIN 100000 UNIT/ML MT SUSP
5.0000 mL | Freq: Four times a day (QID) | OROMUCOSAL | Status: DC
  Administered 2015-08-13 – 2015-08-26 (×42): 500000 [IU] via ORAL
  Filled 2015-08-13 (×60): qty 5

## 2015-08-13 MED ORDER — LIP MEDEX EX OINT
TOPICAL_OINTMENT | CUTANEOUS | Status: AC
  Administered 2015-08-13: 21:00:00
  Filled 2015-08-13: qty 7

## 2015-08-13 MED ORDER — SACCHAROMYCES BOULARDII 250 MG PO CAPS
250.0000 mg | ORAL_CAPSULE | Freq: Two times a day (BID) | ORAL | Status: DC
Start: 2015-08-13 — End: 2015-08-27
  Administered 2015-08-13 – 2015-08-27 (×26): 250 mg via ORAL
  Filled 2015-08-13 (×30): qty 1

## 2015-08-13 MED ORDER — FAT EMULSION 20 % IV EMUL
240.0000 mL | INTRAVENOUS | Status: AC
  Administered 2015-08-13: 240 mL via INTRAVENOUS
  Filled 2015-08-13: qty 250

## 2015-08-13 NOTE — Progress Notes (Signed)
Subjective: Feels much better with the new drain, she mobilizes very well.  She is on TNA and Breeze supplement.  She does not want to go up on her diet at this time.  She will be alone at home with a Paediatric nurse.    Objective: Vital signs in last 24 hours: Temp:  [97.5 F (36.4 C)-99.2 F (37.3 C)] 98.1 F (36.7 C) (03/09 0900) Pulse Rate:  [79-94] 85 (03/09 0900) Resp:  [19-26] 19 (03/09 0539) BP: (107-121)/(46-68) 107/46 mmHg (03/09 0900) SpO2:  [98 %-100 %] 98 % (03/09 0900) Last BM Date: 08/10/15 Drains 85 from the left and 30 from the right BM x 3 Afebrile, VSS Labs OK Intake/Output from previous day: 03/08 0701 - 03/09 0700 In: 3144.5 [P.O.:480; IV Piggyback:200; TPN:2464.5] Out: 115 [Drains:115] Intake/Output this shift: Total I/O In: 120 [P.O.:120] Out: 25 [Drains:25]  General appearance: alert, cooperative and no distress GI: soft, much less tender today, not much in either drain right now.    Lab Results:   Recent Labs  08/12/15 0539 08/13/15 0822  WBC 9.2 9.5  HGB 9.3* 9.7*  HCT 29.6* 30.7*  PLT 410* 465*    BMET  Recent Labs  08/12/15 0539 08/13/15 0822  NA 140 139  K 4.1 3.9  CL 103 104  CO2 26 25  GLUCOSE 129* 113*  BUN 17 18  CREATININE 0.54 0.67  CALCIUM 8.9 8.8*   PT/INR  Recent Labs  08/12/15 0539  LABPROT 16.8*  INR 1.35     Recent Labs Lab 08/10/15 0604 08/13/15 0822  AST 21 13*  ALT 8* 11*  ALKPHOS 70 101  BILITOT 1.3* 1.0  PROT 6.8 7.1  ALBUMIN 2.8* 2.7*     Lipase     Component Value Date/Time   LIPASE 18 07/30/2015 1251     Studies/Results: Ct Abdomen Pelvis W Contrast  08/11/2015  CLINICAL DATA:  Followup endometrial stromal sarcoma and abscess. Recently began chemotherapy. Previous radiation therapy. EXAM: CT ABDOMEN AND PELVIS WITH CONTRAST TECHNIQUE: Multidetector CT imaging of the abdomen and pelvis was performed using the standard protocol following bolus administration of intravenous contrast.  CONTRAST:  114mL OMNIPAQUE IOHEXOL 300 MG/ML  SOLN COMPARISON:  08/04/2015 FINDINGS: Lower chest: Bibasilar atelectasis shows improvement since previous study. Mild cardiomegaly is stable. Hepatobiliary: No masses or other significant abnormality. Gallbladder is unremarkable. Pancreas: No mass, inflammatory changes, or other significant abnormality. Spleen: Within normal limits in size and appearance. Adrenals/Urinary Tract: No adrenal or renal masses identified. No evidence of right-sided hydronephrosis. Mild left hydronephrosis and ureterectasis shows mild increase due to left iliac pelvic lymphadenopathy which is mildly increased since previous study. 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 Left colonic diverticulosis is again demonstrated, without evidence of diverticulitis. No evidence of bowel obstruction. A left lower quadrant percutaneous drainage catheter remains in place within the anterior portion of a pelvic fluid collection. This pelvic fluid collection currently measures 7.0 x 11.5 cm and is not simply changed in size when measured similarly on previous study. This is consistent with abscess. Vascular/Lymphatic: Portacaval lymph node on image 32 measures 1.4 cm and is not significant changed. Aorto-caval lymphadenopathy shows increase in size, currently measuring 3.4 by 4.3 cm on image 51 compared to 3.3 x 3.7 cm previously. Left common iliac lymphadenopathy measures 2.2 cm on image 58 and shows no significant change in size. Pelvic iliac lymphadenopathy obstructing the distal left ureter currently measures 2.1 x 3.1 cm on image 77 compared to 2.0  x 2.6 cm previously. Right external iliac lymphadenopathy measures 2.2 cm on image 72 compared to 1.7 cm previously. Right obturator internus lymphadenopathy measures 1.4 cm on image 82 compared to 1.2 cm previously. Reproductive: Prior hysterectomy noted. Above-described pelvic fluid collection involves the hysterectomy bed  Other: None. Musculoskeletal:  No suspicious bone lesions identified. IMPRESSION: No significant change in large pelvic abscess measuring approximately 7 x 11.5 cm with percutaneous drainage catheter remaining in place. Overall mild progression of abdominal and pelvic lymphadenopathy, consistent with mild progression of metastatic disease. Mild increase in left hydroureteronephrosis due to metastatic lymphadenopathy in the left lower pelvis. Electronically Signed   By: Earle Gell M.D.   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 Patient underwent technically successful percutaneous drainage catheter placement into the dominant component within the right lower abdominal quadrant on 07/31/2015, though subsequent imaging has demonstrated a persistent fluid collection with the lower pelvis. As such, the patient presents today for attempted placement of additional percutaneous drainage catheter into the residual pelvic fluid collection. EXAM: CT IMAGE GUIDED DRAINAGE BY PERCUTANEOUS CATHETER COMPARISON:  None. MEDICATIONS: The patient is currently admitted to the hospital and receiving intravenous antibiotics. The antibiotics were administered within an appropriate time frame prior to the initiation of the procedure. ANESTHESIA/SEDATION: Moderate (conscious) sedation was employed during this procedure. A total of Versed 4 mg and Fentanyl 100 mcg was administered intravenously. Moderate Sedation Time: 22 minutes. The patient's level of consciousness and vital signs were monitored continuously by radiology nursing throughout the procedure under my direct supervision. CONTRAST:  None COMPLICATIONS: None immediate. PROCEDURE: Informed written consent was obtained from the patient after a discussion of the risks, benefits and alternatives to treatment. The patient was placed supine on the CT  gantry and a pre procedural CT was performed re-demonstrating the known abscess/fluid collection within the midline of the lower pelvis with dominant component measuring 7.7 x 5.5 cm. The procedure was planned. A timeout was performed prior to the initiation of the procedure. The skin overlying the left lower anterior abdomen/pelvic was prepped and draped in the usual sterile fashion. The overlying soft tissues were anesthetized with 1% lidocaine with epinephrine. Appropriate trajectory was planned with the use of a 22 gauge spinal needle. An 18 gauge trocar needle was advanced into the abscess/fluid collection and a short Amplatz super stiff wire was coiled within the collection. Appropriate positioning was confirmed with a limited CT scan. The tract was serially dilated allowing placement of a 10 [place] all-purpose drainage catheter. Appropriate positioning was confirmed with a limited postprocedural CT scan. Approximately 220 Ml of purulent fluid was aspirated. The tube was connected to a JP bulb and sutured in place. A dressing was placed. The patient tolerated the procedure well without immediate post procedural complication. IMPRESSION: Successful CT guided placement of a 10 French all purpose drain catheter into the lower pelvis via left anterior lateral approach with aspiration of 220 mL of purulent fluid. Samples were sent to the laboratory as requested by the ordering clinical team. Electronically Signed   By: Sandi Mariscal M.D.   On: 08/12/2015 16:50    Medications: . acetaminophen  1,000 mg Oral TID  . antiseptic oral rinse  7 mL Mouth Rinse BID  . feeding supplement  1 Container Oral TID BM  . insulin aspart  0-15 Units Subcutaneous 4 times per day  . nystatin  5 mL  Oral QID  . nystatin   Topical BID  . piperacillin-tazobactam (ZOSYN)  IV  3.375 g Intravenous 3 times per day  . sodium chloride flush  3 mL Intravenous Q12H    Assessment/Plan Contained bowel perforation, vs tumor invasion of  the bowel IR drain 07/31/15 - culture: E coli CT IMAGE GUIDED DRAINAGE BY PERCUTANEOUS CATHETER, 08/12/15, LEFT lower abdomen 220 ml drained; Dr. 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  Add probiotics.  From our standpoint she can have heparin or Lovenox for DVT prophylaxis.      LOS: 14 days    Sherae Santino 08/13/2015

## 2015-08-13 NOTE — Progress Notes (Signed)
OT Cancellation Note  Patient Details Name: Erin Santiago MRN: [id number] DOB: 1956/05/25   Cancelled Treatment:    Reason Eval/Treat Not Completed: Other (comment).  Pt has been walking to bathroom with supervision and states she will have a girl help as she needs her when she goes home. Does not feel she needs to practice shower transfer.  Pt doesn't feel she needs further OT, so will sign off.  Sierra Bissonette 08/13/2015, 8:54 AM  Lesle Chris, OTR/L [phone] 08/13/2015

## 2015-08-13 NOTE — Progress Notes (Signed)
Patient ID: Erin Santiago, female   DOB: [birth year], 60 y.o.   MRN: [id number]    Referring Physician(s): Michael Boston  Supervising Physician: Arne Cleveland  Chief Complaint: Abdominal abscesses  Subjective: Pt feels much better today after new drain yesterday  Allergies: Review of patient's allergies indicates no known allergies.  Medications: Prior to Admission medications   Medication Sig Start Date End Date Taking? Authorizing Provider  calcium carbonate (TUMS - DOSED IN MG ELEMENTAL CALCIUM) 500 MG chewable tablet Chew 2 tablets by mouth daily.    Yes Historical Provider, MD  dexamethasone (DECADRON) 4 MG tablet Take 2 tablets by mouth once a day on the day after chemotherapy and then take 2 tablets two times a day for 2 days. Take with food. 07/27/15  Yes Lennis Marion Downer, MD  docusate sodium (COLACE) 100 MG capsule Take 100 mg by mouth 2 (two) times daily as needed for mild constipation. Reported on 05/28/2015 07/17/14  Yes Historical Provider, MD  hydrocortisone 2.5 % cream Apply 1 application topically as needed (itch/psoriasis.). Reported on 07/17/2015 05/19/14  Yes Historical Provider, MD  lidocaine-prilocaine (EMLA) cream Apply to Porta-cath 1-2 hrs prior to access. Cover with [employer]. 08/18/14  Yes Lennis Marion Downer, MD  LORazepam (ATIVAN) 1 MG tablet Place 1/2 to 1 tablet under the tongue or swallow every 6 hours as needed for nausea 07/22/15  Yes Lennis P Livesay, MD  meloxicam (MOBIC) 15 MG tablet Take 15 mg by mouth daily.  06/09/14  Yes Historical Provider, MD  ondansetron (ZOFRAN) 8 MG tablet Take 1 tablet (8 mg total) by mouth every 8 (eight) hours as needed for nausea or vomiting. 07/22/15  Yes Lennis Marion Downer, MD  prochlorperazine (COMPAZINE) 10 MG tablet Take 1 tablet (10 mg total) by mouth every 6 (six) hours as needed (Nausea or vomiting). 07/27/15  Yes Lennis Marion Downer, MD  Psyllium 28.3 % POWD Take 1 packet by mouth daily as needed (for fiber). Reported on  05/26/2015   Yes Historical Provider, MD  Resveratrol 250 MG CAPS Take 250 mg by mouth 2 (two) times daily. Reported on 05/26/2015   Yes Historical Provider, MD  ferrous fumarate (HEMOCYTE) 325 (106 FE) MG TABS tablet Take 1 tab daily on an empty stomach with OJ or Vitamin C 500 mg Patient not taking: Reported on 07/09/2015 09/15/14   Lennis Marion Downer, MD  sucralfate (CARAFATE) 1 GM/10ML suspension Take 10 mLs (1 g total) by mouth 4 (four) times daily -  with meals and at bedtime. Patient not taking: Reported on 07/30/2015 05/20/15   [name], MD    Vital Signs: BP 107/46 mmHg  Pulse 85  Temp(Src) 98.1 F (36.7 C) (Oral)  Resp 19  Ht 5' (1.524 m)  Wt 213 lb 3 oz (96.7 kg)  BMI 41.63 kg/m2  SpO2 98%  LMP 02/04/2014  Physical Exam: Abd: soft, RLQ drain with much less output (30cc), pelvic drain with minimal serosang output currently as well (50cc), drain sites are c/d/i  Imaging: Ct Abdomen Pelvis W Contrast  08/11/2015  CLINICAL DATA:  Followup endometrial stromal sarcoma and abscess. Recently began chemotherapy. Previous radiation therapy. EXAM: CT ABDOMEN AND PELVIS WITH CONTRAST TECHNIQUE: Multidetector CT imaging of the abdomen and pelvis was performed using the standard protocol following bolus administration of intravenous contrast. CONTRAST:  113m OMNIPAQUE IOHEXOL 300 MG/ML  SOLN COMPARISON:  08/04/2015 FINDINGS: Lower chest: Bibasilar atelectasis shows improvement since previous study. Mild cardiomegaly is stable. Hepatobiliary: No masses or other significant  abnormality. Gallbladder is unremarkable. Pancreas: No mass, inflammatory changes, or other significant abnormality. Spleen: Within normal limits in size and appearance. Adrenals/Urinary Tract: No adrenal or renal masses identified. No evidence of right-sided hydronephrosis. Mild left hydronephrosis and ureterectasis shows mild increase due to left iliac pelvic lymphadenopathy which is mildly increased since previous study.  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 Left colonic diverticulosis is again demonstrated, without evidence of diverticulitis. No evidence of bowel obstruction. A left lower quadrant percutaneous drainage catheter remains in place within the anterior portion of a pelvic fluid collection. This pelvic fluid collection currently measures 7.0 x 11.5 cm and is not simply changed in size when measured similarly on previous study. This is consistent with abscess. Vascular/Lymphatic: Portacaval lymph node on image 32 measures 1.4 cm and is not significant changed. Aorto-caval lymphadenopathy shows increase in size, currently measuring 3.4 by 4.3 cm on image 51 compared to 3.3 x 3.7 cm previously. Left common iliac lymphadenopathy measures 2.2 cm on image 58 and shows no significant change in size. Pelvic iliac lymphadenopathy obstructing the distal left ureter currently measures 2.1 x 3.1 cm on image 77 compared to 2.0 x 2.6 cm previously. Right external iliac lymphadenopathy measures 2.2 cm on image 72 compared to 1.7 cm previously. Right obturator internus lymphadenopathy measures 1.4 cm on image 82 compared to 1.2 cm previously. Reproductive: Prior hysterectomy noted. Above-described pelvic fluid collection involves the hysterectomy bed Other: None. Musculoskeletal:  No suspicious bone lesions identified. IMPRESSION: No significant change in large pelvic abscess measuring approximately 7 x 11.5 cm with percutaneous drainage catheter remaining in place. Overall mild progression of abdominal and pelvic lymphadenopathy, consistent with mild progression of metastatic disease. Mild increase in left hydroureteronephrosis due to metastatic lymphadenopathy in the left lower pelvis. Electronically Signed   By: Earle Gell M.D.   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 Patient underwent technically successful percutaneous drainage catheter placement into the dominant component within the right lower abdominal quadrant on 07/31/2015, though subsequent imaging has demonstrated a persistent fluid collection with the lower pelvis. As such, the patient presents today for attempted placement of additional percutaneous drainage catheter into the residual pelvic fluid collection. EXAM: CT IMAGE GUIDED DRAINAGE BY PERCUTANEOUS CATHETER COMPARISON:  None. MEDICATIONS: The patient is currently admitted to the hospital and receiving intravenous antibiotics. The antibiotics were administered within an appropriate time frame prior to the initiation of the procedure. ANESTHESIA/SEDATION: Moderate (conscious) sedation was employed during this procedure. A total of Versed 4 mg and Fentanyl 100 mcg was administered intravenously. Moderate Sedation Time: 22 minutes. The patient's level of consciousness and vital signs were monitored continuously by radiology nursing throughout the procedure under my direct supervision. CONTRAST:  None COMPLICATIONS: None immediate. PROCEDURE: Informed written consent was obtained from the patient after a discussion of the risks, benefits and alternatives to treatment. The patient was placed supine on the CT gantry and a pre procedural CT was performed re-demonstrating the known abscess/fluid collection within the midline of the lower pelvis with dominant component measuring 7.7 x 5.5 cm. The procedure was planned. A timeout was performed prior to the initiation of the procedure. The skin overlying the left lower anterior abdomen/pelvic was prepped and draped in the usual sterile fashion. The overlying soft tissues were anesthetized with 1% lidocaine with epinephrine. Appropriate trajectory was planned with the use of a 22 gauge  spinal needle. An 18 gauge trocar needle was advanced into the abscess/fluid collection and a short Amplatz super stiff wire  was coiled within the collection. Appropriate positioning was confirmed with a limited CT scan. The tract was serially dilated allowing placement of a 10 [place] all-purpose drainage catheter. Appropriate positioning was confirmed with a limited postprocedural CT scan. Approximately 220 Ml of purulent fluid was aspirated. The tube was connected to a JP bulb and sutured in place. A dressing was placed. The patient tolerated the procedure well without immediate post procedural complication. IMPRESSION: Successful CT guided placement of a 10 French all purpose drain catheter into the lower pelvis via left anterior lateral approach with aspiration of 220 mL of purulent fluid. Samples were sent to the laboratory as requested by the ordering clinical team. Electronically Signed   By: Sandi Mariscal M.D.   On: 08/12/2015 16:50    Labs:  CBC:  Recent Labs  08/08/15 0620 08/10/15 0604 08/12/15 0539 08/13/15 0822  WBC 8.8 9.7 9.2 9.5  HGB 10.2* 10.2* 9.3* 9.7*  HCT 32.5* 33.1* 29.6* 30.7*  PLT 469* 475* 410* 465*    COAGS:  Recent Labs  08/26/14 1300 07/30/15 2242 07/31/15 0428 08/12/15 0539  INR 0.95 1.49 1.41 1.35  APTT 27  --  32  --     BMP:  Recent Labs  08/08/15 0620 08/10/15 0604 08/12/15 0539 08/13/15 0822  NA 136 135 140 139  K 4.0 4.8 4.1 3.9  CL 104 104 103 104  CO2 '25 24 26 25  '$ GLUCOSE 125* 119* 129* 113*  BUN '13 17 17 18  '$ CALCIUM 8.4* 8.4* 8.9 8.8*  CREATININE 0.56 0.69 0.54 0.67  GFRNONAA >60 >60 >60 >60  GFRAA >60 >60 >60 >60    LIVER FUNCTION TESTS:  Recent Labs  08/05/15 0506 08/06/15 0513 08/10/15 0604 08/13/15 0822  BILITOT 2.6* 1.4* 1.3* 1.0  AST 11* 11* 21 13*  ALT 10* 10* 8* 11*  ALKPHOS 48 50 70 101  PROT 6.2* 6.1* 6.8 7.1  ALBUMIN 2.5* 2.5* 2.8* 2.7*    Assessment and Plan: Met endom ca; s/p drainage of RLQ abd abscess 2/24 and 3/8 -fistulous output from RLQ drain is minimizing with decrease in diet to clears and TNA.  Cont to  follow -new drain working well, cont flushes -will follow  Electronically Signed: Abbigale Mcelhaney E 08/13/2015, 12:59 PM   I spent a total of 15 Minutes at the the patient's bedside AND on the patient's hospital floor or unit, greater than 50% of which was counseling/coordinating care for abdominal abscesses, s/p perc drain

## 2015-08-13 NOTE — Progress Notes (Signed)
PT Cancellation Note  Patient Details Name: Sheniyah Buzzetta MRN: [id number] DOB: 1955-07-29   Cancelled Treatment:     Pt declined stating, "today is not a good day".  Staff reports pt is amb to and from the bathroom with assist and RW.  Will reattempt another day as schedule permits.   Nathanial Rancher 08/13/2015, 2:41 PM

## 2015-08-13 NOTE — Progress Notes (Signed)
PARENTERAL NUTRITION CONSULT NOTE - INITIAL  Pharmacy Consult for TPN Indication: Perforated bowel; intolerant to enteral feeds  No Known Allergies  Patient Measurements: Height: 5' (152.4 cm) Weight: 213 lb 3 oz (96.7 kg) IBW/kg (Calculated) : 45.5 Adjusted Body Weight: 58.5 kg Usual Weight: ~98kg  Vital Signs: Temp: 98.1 F (36.7 C) (03/09 0900) Temp Source: Oral (03/09 0900) BP: 107/46 mmHg (03/09 0900) Pulse Rate: 85 (03/09 0900) Intake/Output from previous day: 03/08 0701 - 03/09 0700 In: 3144.5 [P.O.:480; IV Piggyback:200; TPN:2464.5] Out: 115 [Drains:115] Intake/Output from this shift: Total I/O In: 120 [P.O.:120] Out: 25 [Drains:25]  Labs:  Recent Labs  08/12/15 0539 08/13/15 0822  WBC 9.2 9.5  HGB 9.3* 9.7*  HCT 29.6* 30.7*  PLT 410* 465*  INR 1.35  --     Recent Labs  08/12/15 0539 08/13/15 0822  NA 140 139  K 4.1 3.9  CL 103 104  CO2 26 25  GLUCOSE 129* 113*  BUN 17 18  CREATININE 0.54 0.67  CALCIUM 8.9 8.8*  MG  --  2.0  PHOS  --  3.3  PROT  --  7.1  ALBUMIN  --  2.7*  AST  --  13*  ALT  --  11*  ALKPHOS  --  101  BILITOT  --  1.0   Estimated Creatinine Clearance: 77.9 mL/min (by C-G formula based on Cr of 0.67).    Recent Labs  08/12/15 1823 08/13/15 0130 08/13/15 0540  GLUCAP 124* 122* 144*   Medical History: Past Medical History  Diagnosis Date  . Abnormal uterine bleeding   . Anemia   . Fibroid   . Heart murmur     under 5 yrs of age  . Cancer ([place]) dx'd 07/2014    endometrial stromal sarcoma  . Radiation 12/01/14-01/05/15    pelvis 45 gray  . Radiation 05/18/2015-06/05/15    T4 thoracic spine area 35 gray   Assessment: 60 yo F admitted 2/23 with abdominal pain and noted to have perforated bowel.  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  She has been on  Zosyn since admission for IAI and Eraxis was added 2/28.   Clear liquid diet was attempted 2/25 however patient developed severe abdominal pain with nausea/vomiting.    Insulin Requirements during previous day: on moderate SSI q6h (4 units of insulin in the past 24 hrs) - no hx DM  Current Nutrition: liquid diet - boost/Resource TID started on 3/6  IVF: none  Central access: implanted port 11/19/14 TPN start date: 08/05/15  ASSESSMENT                                                                                                          HPI: 60 yo F admitted 2/23 with abdominal pain and noted to have perforated bowel.  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 She has been on Zosyn since admission for IAI and Eraxis was added 2/28.   Clear liquid diet was attempted 2/25 however patient developed severe abdominal pain with nausea/vomiting.    Significant events:  3/1: IR consulted for drain adjustment (bulb changed) 3/3: TPN advanced to goal rate 3/5: abscess drainage decreased, 85 ml/24 hr, + BM's 3/6: patient reported BM this morning and eating about ~75% of meals (not documented in [facility]), Resource started 3/7 abd CT: no signif change in pelvic abscess, metastatic lymphadenopathy in the left lower pelvis.  Today:   Glucose (goal cbgs <150): all cbgs <150  Electrolytes: lytes wnl   Renal:  stable at patient's baseline  LFTs: TBili elevated but trending down (1.3).  Patient does not appear jaundiced.   TGs: 188 (3/2), 115 (3/6)  Prealbumin: 6.4 (3/2), 13.7 (3/6)  NUTRITIONAL GOALS                                                                                             RD recs (3/8): Kcal: 1950-2150 (20-22 kcal/kg) Protein: 120-140 grams of protein (1.25-1.45 grams/kg) Fluid: 2 L/day  Clinimix E 5/15 at a goal  rate of 83 ml/hr + 20% fat emulsion at 10 ml/hr to provide: 100g/day protein, 1894 Kcal/day.  PLAN                                                                                                                         At 1800 today:   Continue Clinimix E 5/15 at 83 ml/hr   20% fat emulsion at 10 ml/hr.  TPN to contain standard multivitamins and trace elements.  Continue moderate SSI q6h  TPN lab panels on Mondays & Thursdays.  F/u daily.  Dia Sitter, PharmD, BCPS 08/13/2015 10:51 AM

## 2015-08-13 NOTE — Progress Notes (Signed)
MEDICAL ONCOLOGY August 13, 2015, 8:13 AM  Hospital day 15 Antibiotics: zosyn Chemotherapy: (last May 2016) Planned adriamycin/ olaratumab held  EMR reviewed.  Subjective: Patient is seen, with student RN present, walking in room. She reports feeling much more comfortable since second drain placed by IR on 08-12-15, no longer pain when voids or bowels move as she had over last few days prior. She dislikes clear liquid diet other than the [facility], which she likes very much. She is a little sore in area of second drain, but no severe pain. Less nausea, no vomiting. Able to sleep well last pm. Denies SOB with this activity. No problems with PAC.   ONCOLOGIC HISTORY  Patient had been menopausal since age 22 until she had what seemed to her to be a menstrual period in Sept 2015, with large blood clot at completion of bleeding stopped then. She continued with lesser bleeding over next several months until she was seen in 06-2014 by [name]. Hemoglobin was 12.7 on 07-04-14. CT CAP 06-25-14 had uterus 16.4 x 13.5 x 14.4 cm with marked expansion of endometrial canal by heterogeneous mass, bilateral pelvic sidewall adenopathy, iliac adenopathy with no definite retroperitoneal or mesenteric adenopathy, no ascites, no liver mets. Attempted endometrial sampling 06-26-14 and 07-03-14 was nondiagnostic; around that time the patient was passing clots and using one large maxipad hourly. She was seen by [name] 07-05-14, with uterine fundus palpable above umbilicus. She had surgery by [name] at [facility] on 07-14-14, which was exploratory laparotomy with TAH BSO, bilateral total pelvic lymphadenectomy and sampling of aortic and renal nodes. 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 Case was presented at [facility] multidisciplinary conference 07-23-14, with recommendation for PET CT to  evaluate inguinal and portahepatis nodes, and to consider adjuvant gemzar taxotere and possibly follow with 4 cycles of adriamycin, then to consider whole pelvic RT. She saw [name] for post op follow up on 07-28-14, with recommendation for gemzar taxotere and adriamycin, whole pelvic RT after chemo and consideration of Megace maintenance after chemo (possibly prior to negative ER PR information). PET 08-01-14 had some uptake in aortocaval and left paraaortic regions. She had day 1 cycle 1 gemzar taxotere on 08-28-14, day 8 cycle 1 on 09-04-14 and neulasta on 09-06-14. Admitted with neutropenic fever on day 14 cycle 1, with oral mucositis and some diarrhea. Counts maintained cycle 2 using OnPro neulasta day 9. She had progressive LE swelling after cycle 3, with venous dopplers negative 10-23-14, then LE swelling and SOB so marked after day 1 cycle 4 10-30-14 that chemo was held. Restaging CT AP + CXR 11-10-14 had stable tiny left pulmonary nodule/ no pleural effusion and normal heart size; CT had necrotic aortocaval adenopathy, iliac adenopathy L>R and 7x10 cm fluid collection in pelvis. She received IMRT 45 gray in 25 fractions to pelvis from 6-27 thru 01-05-15, with resolution of LE swelling by completion of course. CT CAP 02-03-15 showed improvement in retroperitoneal and pelvic involvement. She went onto observation after RT, plan to wait until clear progressive disease before resuming treatment, possibly adriamycin vs on study. CT CAP 05-07-15 showed new lytic lesion at T4, stable 2-3 mm pulmonary nodules, increased left pelvic and retroperitoneal nodes and RLQ peritoneal nodule. Radiation therapy summary as follows: Indication for treatment: A new bone lesion in the right side of the T4 vertebral body, with  soft tissue extension slightly impinging upon the central spinal canal, some pain from this lesion Radiation treatment dates: 05/18/2015 through 06/05/2015 Site/dose: T4 thoracic spine area, 35 gray in 14  fractions She was not eligible for any of the arms of MATCH trial by evaluation in Jan 2017. Plan was to begin treatment with adriamycin + olaratumab, however this not begun prior to hospitalization 07-30-15 with abdominal/ pelvic abscess.   Objective: Vital signs in last 24 hours: Blood pressure 112/52, pulse 79, temperature 97.8 F (36.6 C), temperature source Oral, resp. rate 19, height 5' (1.524 m), weight 213 lb 3 oz (96.7 kg), last menstrual period 02/04/2014, SpO2 100 %. Alert, looks more comfortable and more talkative today. Mildly dyspneic walking in room from BR to chair. Oral mucosa moist. Not icteric. Lungs without wheezes or rales. Heart RRR. PAC site ok, with TNA. Peripheral IV site ok. Abdomen obese, normally active BS. Left drain with bloody liquid, right drain with brownish fluid. LE no pitting edema, cords, tenderness.   Intake/Output from previous day: 03/08 0701 - 03/09 0700 In: 3144.5 [P.O.:480; IV Piggyback:200; TPN:2464.5] Out: 115 [Drains:115] Intake/Output this shift:      Lab Results:  Recent Labs  08/12/15 0539  WBC 9.2  HGB 9.3*  HCT 29.6*  PLT 410*   BMET  Recent Labs  08/12/15 0539  NA 140  K 4.1  CL 103  CO2 26  GLUCOSE 129*  BUN 17  CREATININE 0.54  CALCIUM 8.9    Studies/Results: Ct Abdomen Pelvis W Contrast  08/11/2015  CLINICAL DATA:  Followup endometrial stromal sarcoma and abscess. Recently began chemotherapy. Previous radiation therapy. EXAM: CT ABDOMEN AND PELVIS WITH CONTRAST TECHNIQUE: Multidetector CT imaging of the abdomen and pelvis was performed using the standard protocol following bolus administration of intravenous contrast. CONTRAST:  163mL OMNIPAQUE IOHEXOL 300 MG/ML  SOLN COMPARISON:  08/04/2015 FINDINGS: Lower chest: Bibasilar atelectasis shows improvement since previous study. Mild cardiomegaly is stable. Hepatobiliary: No masses or other significant abnormality. Gallbladder is unremarkable. Pancreas: No mass,  inflammatory changes, or other significant abnormality. Spleen: Within normal limits in size and appearance. Adrenals/Urinary Tract: No adrenal or renal masses identified. No evidence of right-sided hydronephrosis. Mild left hydronephrosis and ureterectasis shows mild increase due to left iliac pelvic lymphadenopathy which is mildly increased since previous study. 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 Left colonic diverticulosis is again demonstrated, without evidence of diverticulitis. No evidence of bowel obstruction. A left lower quadrant percutaneous drainage catheter remains in place within the anterior portion of a pelvic fluid collection. This pelvic fluid collection currently measures 7.0 x 11.5 cm and is not simply changed in size when measured similarly on previous study. This is consistent with abscess. Vascular/Lymphatic: Portacaval lymph node on image 32 measures 1.4 cm and is not significant changed. Aorto-caval lymphadenopathy shows increase in size, currently measuring 3.4 by 4.3 cm on image 51 compared to 3.3 x 3.7 cm previously. Left common iliac lymphadenopathy measures 2.2 cm on image 58 and shows no significant change in size. Pelvic iliac lymphadenopathy obstructing the distal left ureter currently measures 2.1 x 3.1 cm on image 77 compared to 2.0 x 2.6 cm previously. Right external iliac lymphadenopathy measures 2.2 cm on image 72 compared to 1.7 cm previously. Right obturator internus lymphadenopathy measures 1.4 cm on image 82 compared to 1.2 cm previously. Reproductive: Prior hysterectomy noted. Above-described pelvic fluid collection involves the hysterectomy bed Other: None. Musculoskeletal:  No suspicious bone lesions identified. IMPRESSION: No significant change  in large pelvic abscess measuring approximately 7 x 11.5 cm with percutaneous drainage catheter remaining in place. Overall mild progression of abdominal and pelvic lymphadenopathy,  consistent with mild progression of metastatic disease. Mild increase in left hydroureteronephrosis due to metastatic lymphadenopathy in the left lower pelvis. Electronically Signed   By: Earle Gell M.D.   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 Patient underwent technically successful percutaneous drainage catheter placement into the dominant component within the right lower abdominal quadrant on 07/31/2015, though subsequent imaging has demonstrated a persistent fluid collection with the lower pelvis. As such, the patient presents today for attempted placement of additional percutaneous drainage catheter into the residual pelvic fluid collection. EXAM: CT IMAGE GUIDED DRAINAGE BY PERCUTANEOUS CATHETER COMPARISON:  None. MEDICATIONS: The patient is currently admitted to the hospital and receiving intravenous antibiotics. The antibiotics were administered within an appropriate time frame prior to the initiation of the procedure. ANESTHESIA/SEDATION: Moderate (conscious) sedation was employed during this procedure. A total of Versed 4 mg and Fentanyl 100 mcg was administered intravenously. Moderate Sedation Time: 22 minutes. The patient's level of consciousness and vital signs were monitored continuously by radiology nursing throughout the procedure under my direct supervision. CONTRAST:  None COMPLICATIONS: None immediate. PROCEDURE: Informed written consent was obtained from the patient after a discussion of the risks, benefits and alternatives to treatment. The patient was placed supine on the CT gantry and a pre procedural CT was performed re-demonstrating the known abscess/fluid collection within the midline of the lower pelvis with dominant component measuring 7.7 x 5.5 cm. The procedure was planned. A timeout was performed prior to the initiation of the procedure. The skin  overlying the left lower anterior abdomen/pelvic was prepped and draped in the usual sterile fashion. The overlying soft tissues were anesthetized with 1% lidocaine with epinephrine. Appropriate trajectory was planned with the use of a 22 gauge spinal needle. An 18 gauge trocar needle was advanced into the abscess/fluid collection and a short Amplatz super stiff wire was coiled within the collection. Appropriate positioning was confirmed with a limited CT scan. The tract was serially dilated allowing placement of a 10 [place] all-purpose drainage catheter. Appropriate positioning was confirmed with a limited postprocedural CT scan. Approximately 220 Ml of purulent fluid was aspirated. The tube was connected to a JP bulb and sutured in place. A dressing was placed. The patient tolerated the procedure well without immediate post procedural complication. IMPRESSION: Successful CT guided placement of a 10 French all purpose drain catheter into the lower pelvis via left anterior lateral approach with aspiration of 220 mL of purulent fluid. Samples were sent to the laboratory as requested by the ordering clinical team. Electronically Signed   By: Sandi Mariscal M.D.   On: 08/12/2015 16:50     Assessment/Plan: 1. LLQ/ pelvic abscess in setting of progressive endometrial stromal sarcoma:clinically improving with IR drains. Continuing antibiotics and TNA.   2.Endometrial stromal sarcoma: surgery [facility] 07-14-14, progression by start of cycle 4 adjuvant gemzar taxotere in 10-2014. Radiation to symptomatic aortocaval and bilateral iliac adenopathy. Further progression recently including T4, post radiation there. Not eligible for any of the arms of MATCH trial. Plan had been to start adriamycin + olaratumab just at time of this complication, held with these acute problems, expect to begin outpatient when stable. Long term prognosis from the metastatic endometrial sarcoma is not good, however disease has not  been rapidly  progressive and patient has been very functional.  3.nutritional status: on clear liquid diet + TNA. 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 Hx nonmelanoma skin cancer. 9.multifactorial anemia: will check iron studies with next blood draw  [place]  Pager [phone]

## 2015-08-13 NOTE — Progress Notes (Signed)
[facility] PROGRESS NOTE  Erin Santiago [id number] DOB: [birth year] DOA: 07/30/2015 PCP: Merrilee Seashore, MD  Brief interval history  Erin Santiago is a 60 y.o. 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 Patient is status post chemotherapy and radiation to the pelvis and back and was due to start chemotherapy on the day of admission due to progression of disease involving the left abdominal pain. Presented to the ED with complaints of 1-2 day history of diffuse abdominal pain. CT abdomen and pelvis showed bowel perforation. Large 9 x 4.5 cm complex right lower quadrant and pelvic abscess containing a small amount of contrast suggesting communication with the distal ileum. general surgery consulted recommended initially nonoperative percutaneous drainage.She was placed on bowel rest and monitored. Interventional radiology was also consulted for drain placement. Patient had a percutaneous drain placed on 07/31/2015 and was placed empirically on IV Zosyn from admission. Wound/abscess cultures grew out Escherichia coli. Started on clears, Repeat CT abdomen and pelvis pending for 08/04/2015  Assessment/Plan: #1 bowel perforation, complex intra-abdominal fluid collection 9 x 4.5 cm right lower quadrant and pelvic -likely due to tumor invasion -Per CT abdomen and pelvis appears that perforation may have been walled off and contained.  - general surgery folowing, recommending nonoperative percutaneous drainage placement. S/p RLQ percutaneous drain 07/31/2015 per IR.  Wound/abscess cultures growing Escherichia coli, this organism was susceptible. She has been on IV Zosyn.  -Gen. surgery and interventional radiology following. -Blood cultures obtained on 07/30/2015 show no growth 2  -She remains on TNA -On 08/10/2015 she reported some abdominal pain and  bloating with soft diet. She was placed back on clears.  -On 08/11/2015 repeat CT scan of abdomen and pelvis did not reveal significant change in large pelvic abscess per radiology, measuring 7 x 11.5 cm. Percutaneous drainage catheter in place.  -3/8 underwent CT guided placed of a 10 Fr drainage catheter placement into the pelvis via the left lower abdomen yielding 220 cc of purulent material.   #2 history of iron deficiency anemia -Stable, monitor periodically  #3 dehydration/metabolic acidosis -Resolving. She was treated with IV fluids.   #4 hypokalemia -Potassium improved   #5 lactic acidosis Likely secondary to problem #1 and dehydration.   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 Patient status post chemotherapy and radiation to the pelvis. Patient was to start chemotherapy on the day of admission due to progression of disease involving the left, iliac adenopathy, 2 masses in the right lower quadrant seen on CT scan on 07/21/2015.   -She had a repeat CT scan on 08/11/2015 that revealed mild progression of abdominal pelvic lymphadenopathy consistent with progression of metastatic disease. -Radiology also revealed increase in mild hydronephrosis due to metastatic lymphadenopathy. Renal function remains stable.  -Dr. Marko Plume of medical oncology following, overall prognosis is poor  #7 prophylaxis SCDs for DVT prophylaxis.  Code Status: Full Family Communication: Updated patient. No family at bedside. Disposition Plan: not ready for discharge   Consultants:  Gen. surgery: Dr. Excell Seltzer 07/30/2015   oncology: Dr. Marko Plume 07/31/2015  Interventional radiology  Procedures:  CT abdomen and pelvis 07/30/2015  Acute abdominal series 07/30/2015  Right lower quadrant abdominal abscess drain with CT 07/31/2015 for Dr. Annamaria Boots  Antibiotics:  IV  Zosyn 07/30/2015  HPI/Subjective: Patient sitting at bedside chair, she reports abdominal  pain bowel movements. She is on clears  Objective: Filed Vitals:   08/13/15 0539 08/13/15 0900  BP: 112/52 107/46  Pulse: 79 85  Temp: 97.8 F (36.6 C) 98.1 F (36.7 C)  Resp: 19     Intake/Output Summary (Last 24 hours) at 08/13/15 1258 Last data filed at 08/13/15 1134  Gross per 24 hour  Intake 3274.5 ml  Output    165 ml  Net 3109.5 ml   Filed Weights   08/06/15 0500 08/07/15 0500 08/07/15 1536  Weight: 97.2 kg (214 lb 4.6 oz) 97.07 kg (214 lb) 96.7 kg (213 lb 3 oz)    Exam:   General:  NAD, sitting at bedside chair, nontoxic  Cardiovascular: RRR  Respiratory: Normal respiratory effort, lungs are clear to auscultation bilaterally  Abdomen: Soft, nontender, nondistended. JP drain in place  Musculoskeletal: No clubbing cyanosis or edema.   Data Reviewed: Basic Metabolic Panel:  Recent Labs Lab 08/08/15 0620 08/10/15 0604 08/12/15 0539 08/13/15 0822  NA 136 135 140 139  K 4.0 4.8 4.1 3.9  CL 104 104 103 104  CO2 25 24 26 25   GLUCOSE 125* 119* 129* 113*  BUN 13 17 17 18   CREATININE 0.56 0.69 0.54 0.67  CALCIUM 8.4* 8.4* 8.9 8.8*  MG 2.2 2.3  --  2.0  PHOS 3.5 3.1  --  3.3   Liver Function Tests:  Recent Labs Lab 08/10/15 0604 08/13/15 0822  AST 21 13*  ALT 8* 11*  ALKPHOS 70 101  BILITOT 1.3* 1.0  PROT 6.8 7.1  ALBUMIN 2.8* 2.7*   No results for input(s): LIPASE, AMYLASE in the last 168 hours. No results for input(s): AMMONIA in the last 168 hours. CBC:  Recent Labs Lab 08/08/15 0620 08/10/15 0604 08/12/15 0539 08/13/15 0822  WBC 8.8 9.7 9.2 9.5  NEUTROABS  --  8.0*  --   --   HGB 10.2* 10.2* 9.3* 9.7*  HCT 32.5* 33.1* 29.6* 30.7*  MCV 87.1 87.3 86.5 86.5  PLT 469* 475* 410* 465*   Cardiac Enzymes: No results for input(s): CKTOTAL, CKMB, CKMBINDEX, TROPONINI in the last 168 hours. BNP (last 3 results) No results for input(s): BNP in the  last 8760 hours.  ProBNP (last 3 results) No results for input(s): PROBNP in the last 8760 hours.  CBG:  Recent Labs Lab 08/12/15 1156 08/12/15 1823 08/13/15 0130 08/13/15 0540 08/13/15 1219  GLUCAP 111* 124* 122* 144* 123*    Recent Results (from the past 240 hour(s))  Culture, Urine     Status: None   Collection Time: 08/04/15 12:24 PM  Result Value Ref Range Status   Specimen Description URINE, RANDOM  Final   Special Requests NONE  Final   Culture   Final    NO GROWTH 1 DAY Performed at [facility]    Report Status 08/05/2015 FINAL  Final  Body fluid culture     Status: None   Collection Time: 08/04/15  6:30 PM  Result Value Ref Range Status   Specimen Description ABDOMEN PERFORATED BOWEL  Final   Special Requests NONE  Final   Gram Stain   Final    RARE WBC PRESENT, PREDOMINANTLY MONONUCLEAR GRAM NEGATIVE RODS GRAM POSITIVE COCCI IN CHAINS IN PAIRS GRAM POSITIVE RODS    Culture   Final    MULTIPLE ORGANISMS PRESENT, NONE PREDOMINANT Performed at [facility]    Report Status 08/09/2015 FINAL  Final  Culture, routine-abscess     Status: None (Preliminary result)  Collection Time: 08/12/15  4:18 PM  Result Value Ref Range Status   Specimen Description DRAINAGE LEFT LOWER ABDOMEN  Final   Special Requests Normal  Final   Gram Stain   Final    ABUNDANT WBC PRESENT,BOTH PMN AND MONONUCLEAR NO SQUAMOUS EPITHELIAL CELLS SEEN ABUNDANT GRAM VARIABLE ROD Performed at [facility]    Culture PENDING  Incomplete   Report Status PENDING  Incomplete     Studies: Ct Abdomen Pelvis W Contrast  08/11/2015  CLINICAL DATA:  Followup endometrial stromal sarcoma and abscess. Recently began chemotherapy. Previous radiation therapy. EXAM: CT ABDOMEN AND PELVIS WITH CONTRAST TECHNIQUE: Multidetector CT imaging of the abdomen and pelvis was performed using the standard protocol following bolus administration of intravenous contrast. CONTRAST:  157mL  OMNIPAQUE IOHEXOL 300 MG/ML  SOLN COMPARISON:  08/04/2015 FINDINGS: Lower chest: Bibasilar atelectasis shows improvement since previous study. Mild cardiomegaly is stable. Hepatobiliary: No masses or other significant abnormality. Gallbladder is unremarkable. Pancreas: No mass, inflammatory changes, or other significant abnormality. Spleen: Within normal limits in size and appearance. Adrenals/Urinary Tract: No adrenal or renal masses identified. No evidence of right-sided hydronephrosis. Mild left hydronephrosis and ureterectasis shows mild increase due to left iliac pelvic lymphadenopathy which is mildly increased since previous study. 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 Left colonic diverticulosis is again demonstrated, without evidence of diverticulitis. No evidence of bowel obstruction. A left lower quadrant percutaneous drainage catheter remains in place within the anterior portion of a pelvic fluid collection. This pelvic fluid collection currently measures 7.0 x 11.5 cm and is not simply changed in size when measured similarly on previous study. This is consistent with abscess. Vascular/Lymphatic: Portacaval lymph node on image 32 measures 1.4 cm and is not significant changed. Aorto-caval lymphadenopathy shows increase in size, currently measuring 3.4 by 4.3 cm on image 51 compared to 3.3 x 3.7 cm previously. Left common iliac lymphadenopathy measures 2.2 cm on image 58 and shows no significant change in size. Pelvic iliac lymphadenopathy obstructing the distal left ureter currently measures 2.1 x 3.1 cm on image 77 compared to 2.0 x 2.6 cm previously. Right external iliac lymphadenopathy measures 2.2 cm on image 72 compared to 1.7 cm previously. Right obturator internus lymphadenopathy measures 1.4 cm on image 82 compared to 1.2 cm previously. Reproductive: Prior hysterectomy noted. Above-described pelvic fluid collection involves the hysterectomy bed Other: None.  Musculoskeletal:  No suspicious bone lesions identified. IMPRESSION: No significant change in large pelvic abscess measuring approximately 7 x 11.5 cm with percutaneous drainage catheter remaining in place. Overall mild progression of abdominal and pelvic lymphadenopathy, consistent with mild progression of metastatic disease. Mild increase in left hydroureteronephrosis due to metastatic lymphadenopathy in the left lower pelvis. Electronically Signed   By: Earle Gell M.D.   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 Patient underwent technically successful percutaneous drainage catheter placement into the dominant component within the right lower abdominal quadrant on 07/31/2015, though subsequent imaging has demonstrated a persistent fluid collection with the lower pelvis. As such, the patient presents today for attempted placement of additional percutaneous drainage catheter into the residual pelvic fluid collection. EXAM: CT IMAGE GUIDED DRAINAGE BY PERCUTANEOUS CATHETER COMPARISON:  None. MEDICATIONS: The patient is currently admitted to the hospital and receiving intravenous antibiotics. The antibiotics were administered within an appropriate time frame prior to the initiation of the procedure. ANESTHESIA/SEDATION: Moderate (conscious)  sedation was employed during this procedure. A total of Versed 4 mg and Fentanyl 100 mcg was administered intravenously. Moderate Sedation Time: 22 minutes. The patient's level of consciousness and vital signs were monitored continuously by radiology nursing throughout the procedure under my direct supervision. CONTRAST:  None COMPLICATIONS: None immediate. PROCEDURE: Informed written consent was obtained from the patient after a discussion of the risks, benefits and alternatives to treatment. The patient was placed supine on the CT gantry and a pre  procedural CT was performed re-demonstrating the known abscess/fluid collection within the midline of the lower pelvis with dominant component measuring 7.7 x 5.5 cm. The procedure was planned. A timeout was performed prior to the initiation of the procedure. The skin overlying the left lower anterior abdomen/pelvic was prepped and draped in the usual sterile fashion. The overlying soft tissues were anesthetized with 1% lidocaine with epinephrine. Appropriate trajectory was planned with the use of a 22 gauge spinal needle. An 18 gauge trocar needle was advanced into the abscess/fluid collection and a short Amplatz super stiff wire was coiled within the collection. Appropriate positioning was confirmed with a limited CT scan. The tract was serially dilated allowing placement of a 10 [place] all-purpose drainage catheter. Appropriate positioning was confirmed with a limited postprocedural CT scan. Approximately 220 Ml of purulent fluid was aspirated. The tube was connected to a JP bulb and sutured in place. A dressing was placed. The patient tolerated the procedure well without immediate post procedural complication. IMPRESSION: Successful CT guided placement of a 10 French all purpose drain catheter into the lower pelvis via left anterior lateral approach with aspiration of 220 mL of purulent fluid. Samples were sent to the laboratory as requested by the ordering clinical team. Electronically Signed   By: Sandi Mariscal M.D.   On: 08/12/2015 16:50    Scheduled Meds: . acetaminophen  1,000 mg Oral TID  . antiseptic oral rinse  7 mL Mouth Rinse BID  . feeding supplement  1 Container Oral TID BM  . insulin aspart  0-15 Units Subcutaneous 4 times per day  . nystatin  5 mL Oral QID  . nystatin   Topical BID  . piperacillin-tazobactam (ZOSYN)  IV  3.375 g Intravenous 3 times per day  . saccharomyces boulardii  250 mg Oral BID  . sodium chloride flush  3 mL Intravenous Q12H   Continuous Infusions: . Marland KitchenTPN  (CLINIMIX-E) Adult 83 mL/hr at 08/12/15 1830   And  . fat emulsion 240 mL (08/13/15 0000)  . Marland KitchenTPN (CLINIMIX-E) Adult     And  . 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 If 7PM-7AM, please contact night-coverage at [email or web address], password [facility] 08/13/2015, 12:58 PM  LOS: 14 days

## 2015-08-14 LAB — IRON AND TIBC
Iron: 39 ug/dL (ref 28–170)
SATURATION RATIOS: 20 % (ref 10.4–31.8)
TIBC: 199 ug/dL — ABNORMAL LOW (ref 250–450)
UIBC: 160 ug/dL

## 2015-08-14 LAB — GLUCOSE, CAPILLARY
GLUCOSE-CAPILLARY: 103 mg/dL — AB (ref 65–99)
GLUCOSE-CAPILLARY: 106 mg/dL — AB (ref 65–99)
GLUCOSE-CAPILLARY: 110 mg/dL — AB (ref 65–99)
GLUCOSE-CAPILLARY: 117 mg/dL — AB (ref 65–99)
Glucose-Capillary: 108 mg/dL — ABNORMAL HIGH (ref 65–99)

## 2015-08-14 MED ORDER — TRACE MINERALS CR-CU-MN-SE-ZN 10-1000-500-60 MCG/ML IV SOLN
INTRAVENOUS | Status: AC
  Administered 2015-08-14: 18:00:00 via INTRAVENOUS
  Filled 2015-08-14: qty 2280

## 2015-08-14 MED ORDER — DICLOFENAC SODIUM 1 % TD GEL
2.0000 g | Freq: Three times a day (TID) | TRANSDERMAL | Status: DC | PRN
  Administered 2015-08-14 – 2015-08-27 (×6): 2 g via TOPICAL
  Filled 2015-08-14: qty 100

## 2015-08-14 MED ORDER — FAT EMULSION 20 % IV EMUL
240.0000 mL | INTRAVENOUS | Status: DC
  Filled 2015-08-14: qty 250

## 2015-08-14 MED ORDER — TRACE MINERALS CR-CU-MN-SE-ZN 10-1000-500-60 MCG/ML IV SOLN
INTRAVENOUS | Status: DC
  Filled 2015-08-14: qty 1992

## 2015-08-14 MED ORDER — FAT EMULSION 20 % IV EMUL
240.0000 mL | INTRAVENOUS | Status: AC
  Administered 2015-08-14: 240 mL via INTRAVENOUS
  Filled 2015-08-14: qty 250

## 2015-08-14 NOTE — Progress Notes (Signed)
Date:  August 14, 2015 Chart reviewed for concurrent status and case management needs. Will continue to follow patient for changes and needs: adjusting po intake and iv tpn Velva Harman, BSN, Therapist, sports, [place]   [phone]

## 2015-08-14 NOTE — Progress Notes (Signed)
[facility] PROGRESS NOTE  Erin Santiago [id number] DOB: [birth year] DOA: 07/30/2015 PCP: Merrilee Seashore, MD  Brief interval history  Erin Santiago is a 60 y.o. 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 Patient is status post chemotherapy and radiation to the pelvis and back and was due to start chemotherapy on the day of admission due to progression of disease involving the left abdominal pain. Presented to the ED with complaints of 1-2 day history of diffuse abdominal pain. CT abdomen and pelvis showed bowel perforation. Large 9 x 4.5 cm complex right lower quadrant and pelvic abscess containing a small amount of contrast suggesting communication with the distal ileum. general surgery consulted recommended initially nonoperative percutaneous drainage.She was placed on bowel rest and monitored. Interventional radiology was also consulted for drain placement. Patient had a percutaneous drain placed on 07/31/2015 and was placed empirically on IV Zosyn from admission. Wound/abscess cultures grew out Escherichia coli. Started on clears, Repeat CT abdomen and pelvis pending for 08/04/2015  Assessment/Plan: #1 Bowel perforation, complex intra-abdominal fluid collection 9 x 4.5 cm right lower quadrant and pelvic -likely due to tumor invasion -Per CT abdomen and pelvis appears that perforation may have been walled off and contained.  - general surgery folowing, recommending nonoperative percutaneous drainage placement. S/p RLQ percutaneous drain 07/31/2015 per IR.  Wound/abscess cultures growing Escherichia coli. She has been on IV Zosyn.  -Gen. surgery and interventional radiology following. -Blood cultures obtained on 07/30/2015 show no growth 2  -She remains on TNA -On 08/10/2015 she reported some abdominal pain and bloating with soft diet. She  was placed back on clears.  -On 08/11/2015 repeat CT scan of abdomen and pelvis did not reveal significant change in large pelvic abscess per radiology, measuring 7 x 11.5 cm -3/8 underwent CT guided placed of a 10 Fr drainage catheter placement into the pelvis via the left lower abdomen yielding 220 cc of purulent material.  -diet being advanced to Full liquids now  #2 history of iron deficiency anemia -Stable, monitor periodically  #3 dehydration/metabolic acidosis -Resolving. She was treated with IV fluids.   #4 hypokalemia -Potassium improved   #5 lactic acidosis Likely secondary to problem #1 and dehydration.   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 Patient status post chemotherapy and radiation to the pelvis. Patient was to start chemotherapy on the day of admission due to progression of disease involving the left, iliac adenopathy, 2 masses in the right lower quadrant seen on CT scan on 07/21/2015.   -She had a repeat CT scan on 08/11/2015 that revealed mild progression of abdominal pelvic lymphadenopathy consistent with progression of metastatic disease. -Radiology also revealed increase in mild hydronephrosis due to metastatic lymphadenopathy. Renal function remains stable.  -Dr. Marko Plume of medical oncology following, overall prognosis is poor  #7 prophylaxis SCDs for DVT prophylaxis.  Code Status: Full Family Communication: Updated patient. No family at bedside. Disposition Plan: not ready for discharge   Consultants:  Gen. surgery: Dr. Excell Seltzer 07/30/2015   oncology: Dr. Marko Plume 07/31/2015  Interventional radiology  Procedures:  CT abdomen and pelvis 07/30/2015  Acute abdominal series 07/30/2015  Right lower quadrant abdominal abscess drain with CT 07/31/2015 for Dr. Annamaria Boots  Antibiotics:  IV Zosyn  07/30/2015  HPI/Subjective: Patient sitting at bedside chair, some blood in stools  Objective:  Filed Vitals:   08/13/15 2200 08/14/15 0535  BP: 117/50 127/58  Pulse: 79 81  Temp: 98.4 F (36.9 C) 98.7 F (37.1 C)  Resp:  18    Intake/Output Summary (Last 24 hours) at 08/14/15 1316 Last data filed at 08/14/15 0537  Gross per 24 hour  Intake    370 ml  Output     80 ml  Net    290 ml   Filed Weights   08/06/15 0500 08/07/15 0500 08/07/15 1536  Weight: 97.2 kg (214 lb 4.6 oz) 97.07 kg (214 lb) 96.7 kg (213 lb 3 oz)    Exam:   General:  NAD, sitting at bedside chair, nontoxic  Cardiovascular: RRR  Respiratory: Normal respiratory effort, lungs are clear to auscultation bilaterally  Abdomen: Soft, nontender, nondistended. 2 drains noted with blooding fluid  Musculoskeletal: No clubbing cyanosis or edema.   Data Reviewed: Basic Metabolic Panel:  Recent Labs Lab 08/08/15 0620 08/10/15 0604 08/12/15 0539 08/13/15 0822  NA 136 135 140 139  K 4.0 4.8 4.1 3.9  CL 104 104 103 104  CO2 25 24 26 25   GLUCOSE 125* 119* 129* 113*  BUN 13 17 17 18   CREATININE 0.56 0.69 0.54 0.67  CALCIUM 8.4* 8.4* 8.9 8.8*  MG 2.2 2.3  --  2.0  PHOS 3.5 3.1  --  3.3   Liver Function Tests:  Recent Labs Lab 08/10/15 0604 08/13/15 0822  AST 21 13*  ALT 8* 11*  ALKPHOS 70 101  BILITOT 1.3* 1.0  PROT 6.8 7.1  ALBUMIN 2.8* 2.7*   No results for input(s): LIPASE, AMYLASE in the last 168 hours. No results for input(s): AMMONIA in the last 168 hours. CBC:  Recent Labs Lab 08/08/15 0620 08/10/15 0604 08/12/15 0539 08/13/15 0822  WBC 8.8 9.7 9.2 9.5  NEUTROABS  --  8.0*  --   --   HGB 10.2* 10.2* 9.3* 9.7*  HCT 32.5* 33.1* 29.6* 30.7*  MCV 87.1 87.3 86.5 86.5  PLT 469* 475* 410* 465*   Cardiac Enzymes: No results for input(s): CKTOTAL, CKMB, CKMBINDEX, TROPONINI in the last 168 hours. BNP (last 3 results) No results for input(s): BNP in the last 8760 hours.  ProBNP  (last 3 results) No results for input(s): PROBNP in the last 8760 hours.  CBG:  Recent Labs Lab 08/13/15 1219 08/13/15 1829 08/13/15 2357 08/14/15 0532 08/14/15 1152  GLUCAP 123* 87 110* 108* 106*    Recent Results (from the past 240 hour(s))  Body fluid culture     Status: None   Collection Time: 08/04/15  6:30 PM  Result Value Ref Range Status   Specimen Description ABDOMEN PERFORATED BOWEL  Final   Special Requests NONE  Final   Gram Stain   Final    RARE WBC PRESENT, PREDOMINANTLY MONONUCLEAR GRAM NEGATIVE RODS GRAM POSITIVE COCCI IN CHAINS IN PAIRS GRAM POSITIVE RODS    Culture   Final    MULTIPLE ORGANISMS PRESENT, NONE PREDOMINANT Performed at [facility]    Report Status 08/09/2015 FINAL  Final  Culture, routine-abscess     Status: None (Preliminary result)   Collection Time: 08/12/15  4:18 PM  Result Value Ref Range Status   Specimen Description DRAINAGE LEFT LOWER ABDOMEN  Final   Special Requests Normal  Final   Gram Stain   Final    ABUNDANT WBC PRESENT,BOTH PMN AND MONONUCLEAR NO SQUAMOUS EPITHELIAL CELLS SEEN ABUNDANT GRAM VARIABLE ROD Performed at [facility]  Culture PENDING  Incomplete   Report Status PENDING  Incomplete     Studies: Ct Image Guided Drainage By Percutaneous Catheter  08/12/2015  INDICATION: History of metastatic endometrial cancer complicated by bowel perforation. Patient underwent technically successful percutaneous drainage catheter placement into the dominant component within the right lower abdominal quadrant on 07/31/2015, though subsequent imaging has demonstrated a persistent fluid collection with the lower pelvis. As such, the patient presents today for attempted placement of additional percutaneous drainage catheter into the residual pelvic fluid collection. EXAM: CT IMAGE GUIDED DRAINAGE BY PERCUTANEOUS CATHETER COMPARISON:  None. MEDICATIONS: The patient is currently admitted to the hospital and receiving  intravenous antibiotics. The antibiotics were administered within an appropriate time frame prior to the initiation of the procedure. ANESTHESIA/SEDATION: Moderate (conscious) sedation was employed during this procedure. A total of Versed 4 mg and Fentanyl 100 mcg was administered intravenously. Moderate Sedation Time: 22 minutes. The patient's level of consciousness and vital signs were monitored continuously by radiology nursing throughout the procedure under my direct supervision. CONTRAST:  None COMPLICATIONS: None immediate. PROCEDURE: Informed written consent was obtained from the patient after a discussion of the risks, benefits and alternatives to treatment. The patient was placed supine on the CT gantry and a pre procedural CT was performed re-demonstrating the known abscess/fluid collection within the midline of the lower pelvis with dominant component measuring 7.7 x 5.5 cm. The procedure was planned. A timeout was performed prior to the initiation of the procedure. The skin overlying the left lower anterior abdomen/pelvic was prepped and draped in the usual sterile fashion. The overlying soft tissues were anesthetized with 1% lidocaine with epinephrine. Appropriate trajectory was planned with the use of a 22 gauge spinal needle. An 18 gauge trocar needle was advanced into the abscess/fluid collection and a short Amplatz super stiff wire was coiled within the collection. Appropriate positioning was confirmed with a limited CT scan. The tract was serially dilated allowing placement of a 10 [place] all-purpose drainage catheter. Appropriate positioning was confirmed with a limited postprocedural CT scan. Approximately 220 Ml of purulent fluid was aspirated. The tube was connected to a JP bulb and sutured in place. A dressing was placed. The patient tolerated the procedure well without immediate post procedural complication. IMPRESSION: Successful CT guided placement of a 10 French all purpose drain catheter  into the lower pelvis via left anterior lateral approach with aspiration of 220 mL of purulent fluid. Samples were sent to the laboratory as requested by the ordering clinical team. Electronically Signed   By: Sandi Mariscal M.D.   On: 08/12/2015 16:50    Scheduled Meds: . acetaminophen  1,000 mg Oral TID  . antiseptic oral rinse  7 mL Mouth Rinse BID  . feeding supplement  1 Container Oral TID BM  . insulin aspart  0-15 Units Subcutaneous 4 times per day  . nystatin  5 mL Oral QID  . nystatin   Topical BID  . piperacillin-tazobactam (ZOSYN)  IV  3.375 g Intravenous 3 times per day  . saccharomyces boulardii  250 mg Oral BID  . sodium chloride flush  3 mL Intravenous Q12H   Continuous Infusions: . Marland KitchenTPN (CLINIMIX-E) Adult 83 mL/hr at 08/13/15 1756   And  . fat emulsion 240 mL (08/13/15 1756)  . Marland KitchenTPN (CLINIMIX-E) Adult     And  . 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 If 7PM-7AM, please contact night-coverage at [email or web address], password [facility] 08/14/2015, 1:16 PM  LOS: 15 days

## 2015-08-14 NOTE — Progress Notes (Addendum)
PARENTERAL NUTRITION CONSULT NOTE - INITIAL  Pharmacy Consult for TPN Indication: Perforated bowel; intolerant to enteral feeds  No Known Allergies  Patient Measurements: Height: 5' (152.4 cm) Weight: 213 lb 3 oz (96.7 kg) IBW/kg (Calculated) : 45.5 Adjusted Body Weight: 58.5 kg Usual Weight: ~98kg  Vital Signs: Temp: 98.7 F (37.1 C) (03/10 0535) Temp Source: Oral (03/10 0535) BP: 127/58 mmHg (03/10 0535) Pulse Rate: 81 (03/10 0535) Intake/Output from previous day: 03/09 0701 - 03/10 0700 In: 620 [P.O.:480; IV Piggyback:100] Out: 130 [Drains:130] Intake/Output from this shift:    Labs:  Recent Labs  08/12/15 0539 08/13/15 0822  WBC 9.2 9.5  HGB 9.3* 9.7*  HCT 29.6* 30.7*  PLT 410* 465*  INR 1.35  --     Recent Labs  08/12/15 0539 08/13/15 0822  NA 140 139  K 4.1 3.9  CL 103 104  CO2 26 25  GLUCOSE 129* 113*  BUN 17 18  CREATININE 0.54 0.67  CALCIUM 8.9 8.8*  MG  --  2.0  PHOS  --  3.3  PROT  --  7.1  ALBUMIN  --  2.7*  AST  --  13*  ALT  --  11*  ALKPHOS  --  101  BILITOT  --  1.0   Estimated Creatinine Clearance: 77.9 mL/min (by C-G formula based on Cr of 0.67).    Recent Labs  08/13/15 1829 08/13/15 2357 08/14/15 0532  GLUCAP 87 110* 108*   Medical History: Past Medical History  Diagnosis Date  . Abnormal uterine bleeding   . Anemia   . Fibroid   . Heart murmur     under 5 yrs of age  . Cancer ([place]) dx'd 07/2014    endometrial stromal sarcoma  . Radiation 12/01/14-01/05/15    pelvis 45 gray  . Radiation 05/18/2015-06/05/15    T4 thoracic spine area 35 gray   Assessment: 60 yo F admitted 2/23 with abdominal pain and noted to have perforated bowel.  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  She has been on Zosyn since admission for IAI and Eraxis was added 2/28.   Clear  liquid diet was attempted 2/25 however patient developed severe abdominal pain with nausea/vomiting.    Insulin Requirements during previous day: on moderate SSI q6h (4 units of insulin in the past 24 hrs) - no hx DM  Current Nutrition: liquid diet - boost/Resource TID started on 3/6  IVF: none  Central access: implanted port 11/19/14 TPN start date: 08/05/15  ASSESSMENT                                                                                                          HPI: 60 yo F admitted 2/23 with abdominal pain and noted to have perforated bowel.  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 She has  been on Zosyn since admission for IAI and Eraxis was added 2/28.   Clear liquid diet was attempted 2/25 however patient developed severe abdominal pain with nausea/vomiting.    Significant events:  3/1: IR consulted for drain adjustment (bulb changed) 3/3: TPN advanced to goal rate 3/5: abscess drainage decreased, 85 ml/24 hr, + BM's 3/6: patient reported BM this morning and eating about ~75% of meals (not documented in [facility]), Resource started 3/7 abd CT: no signif change in pelvic abscess, metastatic lymphadenopathy in the left lower pelvis. 3/8 drainage catheter in pelvis placed by IR  Today:   Glucose (goal cbgs <150): all cbgs <150  Electrolytes: 3/9 - lytes wnl   Renal:  stable at patient's baseline  LFTs: TBili elevated but trending down (1.3).  Patient does not appear jaundiced.   TGs: 188 (3/2), 115 (3/6)  Prealbumin: 6.4 (3/2), 13.7 (3/6)  NUTRITIONAL GOALS                                                                                             RD recs (3/8): Kcal: 1950-2150 (20-22 kcal/kg) Protein: 120-140 grams of protein (1.25-1.45 grams/kg) Fluid: 2 L/day  Clinimix E 5/15 at a goal rate of 95 ml/hr  + 20% fat emulsion at 10 ml/hr to provide: 100g/day protein, 2184 Kcal/day.  PLAN                                                                                                                         At 1800 today:  Increase Clinimix E 5/15 to 95 ml/hr to come closer to meeting protein needs. Will provide 114 gm protein and 2099 kCals  20% fat emulsion at 10 ml/hr.  Follow-up how tolerates diet and ability to stop TPN.    TPN to contain standard multivitamins and trace elements.  Continue moderate SSI q6h  TPN lab panels on Mondays & Thursdays.  F/u daily.  Doreene Eland, PharmD, BCPS.   Pager: [phone] 08/14/2015 8:03 AM

## 2015-08-14 NOTE — Progress Notes (Signed)
Patient ID: Erin Santiago, female   DOB: 1956/05/25, 61 y.o.   MRN: [id number]    Referring Physician(s): [id number],EGBTD  Supervising Physician: Sandi Mariscal  Chief Complaint: Abdominal abscesses   Subjective: Pt doing better since additional pelvic drain placed; no longer has pain with voiding/BM's. Tolerating liquid diet.   Allergies: Review of patient's allergies indicates no known allergies.  Medications: Prior to Admission medications   Medication Sig Start Date End Date Taking? Authorizing Provider  calcium carbonate (TUMS - DOSED IN MG ELEMENTAL CALCIUM) 500 MG chewable tablet Chew 2 tablets by mouth daily.    Yes Historical Provider, MD  dexamethasone (DECADRON) 4 MG tablet Take 2 tablets by mouth once a day on the day after chemotherapy and then take 2 tablets two times a day for 2 days. Take with food. 07/27/15  Yes Lennis Marion Downer, MD  docusate sodium (COLACE) 100 MG capsule Take 100 mg by mouth 2 (two) times daily as needed for mild constipation. Reported on 05/28/2015 07/17/14  Yes Historical Provider, MD  hydrocortisone 2.5 % cream Apply 1 application topically as needed (itch/psoriasis.). Reported on 07/17/2015 05/19/14  Yes Historical Provider, MD  lidocaine-prilocaine (EMLA) cream Apply to Porta-cath 1-2 hrs prior to access. Cover with [employer]. 08/18/14  Yes Lennis Marion Downer, MD  LORazepam (ATIVAN) 1 MG tablet Place 1/2 to 1 tablet under the tongue or swallow every 6 hours as needed for nausea 07/22/15  Yes Lennis P Livesay, MD  meloxicam (MOBIC) 15 MG tablet Take 15 mg by mouth daily.  06/09/14  Yes Historical Provider, MD  ondansetron (ZOFRAN) 8 MG tablet Take 1 tablet (8 mg total) by mouth every 8 (eight) hours as needed for nausea or vomiting. 07/22/15  Yes Lennis Marion Downer, MD  prochlorperazine (COMPAZINE) 10 MG tablet Take 1 tablet (10 mg total) by mouth every 6 (six) hours as needed (Nausea or vomiting). 07/27/15  Yes Lennis Marion Downer, MD  Psyllium 28.3 % POWD Take 1  packet by mouth daily as needed (for fiber). Reported on 05/26/2015   Yes Historical Provider, MD  Resveratrol 250 MG CAPS Take 250 mg by mouth 2 (two) times daily. Reported on 05/26/2015   Yes Historical Provider, MD  ferrous fumarate (HEMOCYTE) 325 (106 FE) MG TABS tablet Take 1 tab daily on an empty stomach with OJ or Vitamin C 500 mg Patient not taking: Reported on 07/09/2015 09/15/14   Lennis Marion Downer, MD  sucralfate (CARAFATE) 1 GM/10ML suspension Take 10 mLs (1 g total) by mouth 4 (four) times daily -  with meals and at bedtime. Patient not taking: Reported on 07/30/2015 05/20/15   [name], MD     Vital Signs: BP 127/58 mmHg  Pulse 81  Temp(Src) 98.7 F (37.1 C) (Oral)  Resp 18  Ht 5' (1.524 m)  Wt 213 lb 3 oz (96.7 kg)  BMI 41.63 kg/m2  SpO2 98%  LMP 02/04/2014  Physical Exam R/L abd/pelvic drains intact; outputs greater on left now, blood-tinged fluid, cx's pend; drains irrigated without difficulty  Imaging: Ct Abdomen Pelvis W Contrast  08/11/2015  CLINICAL DATA:  Followup endometrial stromal sarcoma and abscess. Recently began chemotherapy. Previous radiation therapy. EXAM: CT ABDOMEN AND PELVIS WITH CONTRAST TECHNIQUE: Multidetector CT imaging of the abdomen and pelvis was performed using the standard protocol following bolus administration of intravenous contrast. CONTRAST:  164m OMNIPAQUE IOHEXOL 300 MG/ML  SOLN COMPARISON:  08/04/2015 FINDINGS: Lower chest: Bibasilar atelectasis shows improvement since previous study. Mild cardiomegaly is stable. Hepatobiliary: No  masses or other significant abnormality. Gallbladder is unremarkable. Pancreas: No mass, inflammatory changes, or other significant abnormality. Spleen: Within normal limits in size and appearance. Adrenals/Urinary Tract: No adrenal or renal masses identified. No evidence of right-sided hydronephrosis. Mild left hydronephrosis and ureterectasis shows mild increase due to left iliac pelvic lymphadenopathy which is  mildly increased since previous study. 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 Left colonic diverticulosis is again demonstrated, without evidence of diverticulitis. No evidence of bowel obstruction. A left lower quadrant percutaneous drainage catheter remains in place within the anterior portion of a pelvic fluid collection. This pelvic fluid collection currently measures 7.0 x 11.5 cm and is not simply changed in size when measured similarly on previous study. This is consistent with abscess. Vascular/Lymphatic: Portacaval lymph node on image 32 measures 1.4 cm and is not significant changed. Aorto-caval lymphadenopathy shows increase in size, currently measuring 3.4 by 4.3 cm on image 51 compared to 3.3 x 3.7 cm previously. Left common iliac lymphadenopathy measures 2.2 cm on image 58 and shows no significant change in size. Pelvic iliac lymphadenopathy obstructing the distal left ureter currently measures 2.1 x 3.1 cm on image 77 compared to 2.0 x 2.6 cm previously. Right external iliac lymphadenopathy measures 2.2 cm on image 72 compared to 1.7 cm previously. Right obturator internus lymphadenopathy measures 1.4 cm on image 82 compared to 1.2 cm previously. Reproductive: Prior hysterectomy noted. Above-described pelvic fluid collection involves the hysterectomy bed Other: None. Musculoskeletal:  No suspicious bone lesions identified. IMPRESSION: No significant change in large pelvic abscess measuring approximately 7 x 11.5 cm with percutaneous drainage catheter remaining in place. Overall mild progression of abdominal and pelvic lymphadenopathy, consistent with mild progression of metastatic disease. Mild increase in left hydroureteronephrosis due to metastatic lymphadenopathy in the left lower pelvis. Electronically Signed   By: Earle Gell M.D.   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 Patient underwent technically successful percutaneous drainage catheter placement into the dominant component within the right lower abdominal quadrant on 07/31/2015, though subsequent imaging has demonstrated a persistent fluid collection with the lower pelvis. As such, the patient presents today for attempted placement of additional percutaneous drainage catheter into the residual pelvic fluid collection. EXAM: CT IMAGE GUIDED DRAINAGE BY PERCUTANEOUS CATHETER COMPARISON:  None. MEDICATIONS: The patient is currently admitted to the hospital and receiving intravenous antibiotics. The antibiotics were administered within an appropriate time frame prior to the initiation of the procedure. ANESTHESIA/SEDATION: Moderate (conscious) sedation was employed during this procedure. A total of Versed 4 mg and Fentanyl 100 mcg was administered intravenously. Moderate Sedation Time: 22 minutes. The patient's level of consciousness and vital signs were monitored continuously by radiology nursing throughout the procedure under my direct supervision. CONTRAST:  None COMPLICATIONS: None immediate. PROCEDURE: Informed written consent was obtained from the patient after a discussion of the risks, benefits and alternatives to treatment. The patient was placed supine on the CT gantry and a pre procedural CT was performed re-demonstrating the known abscess/fluid collection within the midline of the lower pelvis with dominant component measuring 7.7 x 5.5 cm. The procedure was planned. A timeout was performed prior to the initiation of the procedure. The skin overlying the left lower anterior abdomen/pelvic was prepped and draped in the usual sterile fashion. The overlying soft tissues were anesthetized with 1% lidocaine with epinephrine. Appropriate trajectory was planned with the use  of a 22 gauge spinal needle. An 18 gauge trocar needle was advanced into the abscess/fluid collection  and a short Amplatz super stiff wire was coiled within the collection. Appropriate positioning was confirmed with a limited CT scan. The tract was serially dilated allowing placement of a 10 [place] all-purpose drainage catheter. Appropriate positioning was confirmed with a limited postprocedural CT scan. Approximately 220 Ml of purulent fluid was aspirated. The tube was connected to a JP bulb and sutured in place. A dressing was placed. The patient tolerated the procedure well without immediate post procedural complication. IMPRESSION: Successful CT guided placement of a 10 French all purpose drain catheter into the lower pelvis via left anterior lateral approach with aspiration of 220 mL of purulent fluid. Samples were sent to the laboratory as requested by the ordering clinical team. Electronically Signed   By: Sandi Mariscal M.D.   On: 08/12/2015 16:50    Labs:  CBC:  Recent Labs  08/08/15 0620 08/10/15 0604 08/12/15 0539 08/13/15 0822  WBC 8.8 9.7 9.2 9.5  HGB 10.2* 10.2* 9.3* 9.7*  HCT 32.5* 33.1* 29.6* 30.7*  PLT 469* 475* 410* 465*    COAGS:  Recent Labs  08/26/14 1300 07/30/15 2242 07/31/15 0428 08/12/15 0539  INR 0.95 1.49 1.41 1.35  APTT 27  --  32  --     BMP:  Recent Labs  08/08/15 0620 08/10/15 0604 08/12/15 0539 08/13/15 0822  NA 136 135 140 139  K 4.0 4.8 4.1 3.9  CL 104 104 103 104  CO2 _0 GLUCOSE 125* 119* 129* 113*  BUN _1 CALCIUM 8.4* 8.4* 8.9 8.8*  CREATININE 0.56 0.69 0.54 0.67  GFRNONAA >60 >60 >60 >60  GFRAA >60 >60 >60 >60    LIVER FUNCTION TESTS:  Recent Labs  08/05/15 0506 08/06/15 0513 08/10/15 0604 08/13/15 0822  BILITOT 2.6* 1.4* 1.3* 1.0  AST 11* 11* 21 13*  ALT 10* 10* 8* 11*  ALKPHOS 48 50 70 101  PROT 6.2* 6.1* 6.8 7.1  ALBUMIN 2.5* 2.5* 2.8* 2.7*    Assessment and Plan: Met endom ca; s/p drainage of RLQ abd abscess 2/24 and left pelvic abscess  3/8; AF; no new lab data; cont current tx; check f/u CT  next week   Electronically Signed: D. Rowe Robert 08/14/2015, 1:38 PM   I spent a total of 15 minutes at the the patient's bedside AND on the patient's hospital floor or unit, greater than 50% of which was counseling/coordinating care for

## 2015-08-14 NOTE — Progress Notes (Signed)
Subjective: She is stable, some blood reported in her stool, H/H has been stable.  The drainage on the right is very bloody in appearance, a little thinner than before, the stuff on the left is serosanguinous.    Objective: Vital signs in last 24 hours: Temp:  [98.4 F (36.9 C)-98.7 F (37.1 C)] 98.7 F (37.1 C) (03/10 0535) Pulse Rate:  [78-81] 81 (03/10 0535) Resp:  [18] 18 (03/10 0535) BP: (115-127)/(50-58) 127/58 mmHg (03/10 0535) SpO2:  [98 %] 98 % (03/10 0535) Last BM Date: 08/13/15  Intake/Output from previous day: 03/09 0701 - 03/10 0700 In: 620 [P.O.:480; IV Piggyback:100] Out: 130 [Drains:130] Intake/Output this shift:    General appearance: alert, cooperative and no distress GI: soft, still a little tender.  having good bowel function, some blood reported in the stool.  Lab Results:   Recent Labs  08/12/15 0539 08/13/15 0822  WBC 9.2 9.5  HGB 9.3* 9.7*  HCT 29.6* 30.7*  PLT 410* 465*    BMET  Recent Labs  08/12/15 0539 08/13/15 0822  NA 140 139  K 4.1 3.9  CL 103 104  CO2 26 25  GLUCOSE 129* 113*  BUN 17 18  CREATININE 0.54 0.67  CALCIUM 8.9 8.8*   PT/INR  Recent Labs  08/12/15 0539  LABPROT 16.8*  INR 1.35     Recent Labs Lab 08/10/15 0604 08/13/15 0822  AST 21 13*  ALT 8* 11*  ALKPHOS 70 101  BILITOT 1.3* 1.0  PROT 6.8 7.1  ALBUMIN 2.8* 2.7*     Lipase     Component Value Date/Time   LIPASE 18 07/30/2015 1251     Studies/Results: Ct Image Guided Drainage By Percutaneous Catheter  08/12/2015  INDICATION: History of metastatic endometrial cancer complicated by bowel perforation. Patient underwent technically successful percutaneous drainage catheter placement into the dominant component within the right lower abdominal quadrant on 07/31/2015, though subsequent imaging has demonstrated a persistent fluid collection with the lower pelvis. As such, the patient presents today for attempted placement of additional percutaneous  drainage catheter into the residual pelvic fluid collection. EXAM: CT IMAGE GUIDED DRAINAGE BY PERCUTANEOUS CATHETER COMPARISON:  None. MEDICATIONS: The patient is currently admitted to the hospital and receiving intravenous antibiotics. The antibiotics were administered within an appropriate time frame prior to the initiation of the procedure. ANESTHESIA/SEDATION: Moderate (conscious) sedation was employed during this procedure. A total of Versed 4 mg and Fentanyl 100 mcg was administered intravenously. Moderate Sedation Time: 22 minutes. The patient's level of consciousness and vital signs were monitored continuously by radiology nursing throughout the procedure under my direct supervision. CONTRAST:  None COMPLICATIONS: None immediate. PROCEDURE: Informed written consent was obtained from the patient after a discussion of the risks, benefits and alternatives to treatment. The patient was placed supine on the CT gantry and a pre procedural CT was performed re-demonstrating the known abscess/fluid collection within the midline of the lower pelvis with dominant component measuring 7.7 x 5.5 cm. The procedure was planned. A timeout was performed prior to the initiation of the procedure. The skin overlying the left lower anterior abdomen/pelvic was prepped and draped in the usual sterile fashion. The overlying soft tissues were anesthetized with 1% lidocaine with epinephrine. Appropriate trajectory was planned with the use of a 22 gauge spinal needle. An 18 gauge trocar needle was advanced into the abscess/fluid collection and a short Amplatz super stiff wire was coiled within the collection. Appropriate positioning was confirmed with a limited CT scan. The tract  was serially dilated allowing placement of a 10 [place] all-purpose drainage catheter. Appropriate positioning was confirmed with a limited postprocedural CT scan. Approximately 220 Ml of purulent fluid was aspirated. The tube was connected to a JP bulb and  sutured in place. A dressing was placed. The patient tolerated the procedure well without immediate post procedural complication. IMPRESSION: Successful CT guided placement of a 10 French all purpose drain catheter into the lower pelvis via left anterior lateral approach with aspiration of 220 mL of purulent fluid. Samples were sent to the laboratory as requested by the ordering clinical team. Electronically Signed   By: Sandi Mariscal M.D.   On: 08/12/2015 16:50    Medications: . acetaminophen  1,000 mg Oral TID  . antiseptic oral rinse  7 mL Mouth Rinse BID  . feeding supplement  1 Container Oral TID BM  . insulin aspart  0-15 Units Subcutaneous 4 times per day  . nystatin  5 mL Oral QID  . nystatin   Topical BID  . piperacillin-tazobactam (ZOSYN)  IV  3.375 g Intravenous 3 times per day  . saccharomyces boulardii  250 mg Oral BID  . sodium chloride flush  3 mL Intravenous Q12H    Assessment/Plan Contained bowel perforation, vs tumor invasion of the bowel IR drain 07/31/15 - culture: E coli CT IMAGE GUIDED DRAINAGE BY PERCUTANEOUS CATHETER, 08/12/15, LEFT lower abdomen 220 ml drained; Dr. 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  Our hope would be to get her up on full liquids and when PO intake adequate off the TNA.  She can go home on full liquids.    LOS: 15 days    Anita Laguna 08/14/2015

## 2015-08-14 NOTE — Progress Notes (Signed)
Nutrition Follow-up  DOCUMENTATION CODES:   Not applicable  INTERVENTION:   - Continue TPN per pharmacy. - Diet advancement as medically feasible. - Will continue Boost Breeze TID. - RD will continue to monitor for nutrition needs.  NUTRITION DIAGNOSIS:   Inadequate oral intake related to altered GI function as evidenced by per patient/family report.  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 Patient had presented to the ED with complaints of 1-2 day history of diffuse abdominal pain.   3/10 Continues with no new weight since 08/07/15. Nutrition needs remain appropriate at this time. Pt reports that she is doing well with the Boost Breeze supplement, states she consumed 4 on 3/9.  States that she is no longer having abdominal pain or nausea since the placement of her second IP drain.  This morning patient consumed grape juice and was sipping on a Boost Breeze at time of visit.  States that she prefers cold liquids as she tried sipping on some broth but did not tolerate it well.  States that she is still having some diarrhea, but has noticed some blood in the stool.  Encouraged patient to discuss with doctor.  Patient reports that she is eager to try some solid foods once the doctor thinks she is ready to do so.  Patient currently receiving Clinimix E 5/15 @ 83 ml/hr with 20% fat emulsion @ 10 ml/hr which is providing 100g/day protein (83% minimum estimated protein needs) and 1894 kcal (97% minimum estimated kcal needs).  Not fully meeting protein needs with current  TPN regimen; however, with consistent consumption of Boost Breeze supplements pt is likely meeting minimum protein needs.  Will monitor for diet advancement and further nutrition-related needs.  Medications reviewed.  Labs reviewed: CBG's 108 - 110 mg/dL since 3/10 early am.     3/8 - Diet downgraded to CLD on 3/6 based on symptoms with solid foods. - Pt was NPO at this time for second IR drain placement. - Pt reports constant diarrhea.   - Pt reports nausea with all intakes, include water. - She drank a 1/2 a Boost Breeze then had emesis ~1 hour after intake.  Left supplement order in place for when pt felt she was ready to tolerate the supplement.  - Pt currently receiving Clinimix E 5/15 @ 83 mL/hr with 20% lipids @ 10 mL/hr which is providing 100 grams of protein (83% minimum estimated protein needs) and 1894 kcal (97% minimum estimated kcal needs). - No new weight recorded since 08/07/15. - Pt meeting kcal needs and likely >/= 90% estimated protein needs from TPN regimen and minimal PO intakes.  3/6 - Diet changes as follows from previous assessment:  3/3 @ 1140: CLD  3/4 @ 0807: FLD  3/4 @ 0354: CLD  3/4 @ 1058: Dysphagia 1, thin liquids - Per chart review, pt consumed 0% of dinner 3/4 and 0% of breakfast yesterday (3/5). - Pt reports she has been experiencing constipation and she plans to discuss this with MD today.  - She states that she experiences constipation and associated abdominal pain and  pressure with intakes of solid foods but does not experience these symptoms with intakes of liquids.  - Pt states she has been trying to consume fluids throughout the day; encouraged her to continue with this.  - Nutrition needs adjusted based on weight/BMI, medical hx.  - Pt currently receiving Clinimix E 5/15 @ 83 mL/hr with 20% lipids @ 10 mL/hr which is providing 100 grams of protein (83% minimum estimated protein needs), 1894 kcal (97% minimum estimated kcal needs) from TPN  regimen.  - No new weight recorded since 08/07/15. - Pt meeting kcal needs and likely >/= 90% estimated protein needs from TPN regimen and minimal PO intakes.   *Please see chart for nutrition notes from earlier in admission.  Diet Order:  Diet clear liquid Room service appropriate?: Yes; Fluid consistency:: Thin .TPN (CLINIMIX-E) Adult  Skin:  Wound (see comment) (R abdominal incision for IR drain)  Last BM:  3/9  Height:   Ht Readings from Last 1 Encounters:  08/07/15 5' (1.524 m)    Weight:   Wt Readings from Last 1 Encounters:  08/07/15 213 lb 3 oz (96.7 kg)    Ideal Body Weight:  45.45 kg  BMI:  Body mass index is 41.63 kg/(m^2).  Estimated Nutritional Needs:   Kcal:  1950-2150 (20-22 kcal/kg)  Protein:  120-140 grams of protein (1.25-1.45 grams/kg)  Fluid:  2 L/day  EDUCATION NEEDS:   No education needs identified at this time  Veronda Prude, Dietetic Intern Pager: [phone]

## 2015-08-14 NOTE — Progress Notes (Signed)
Physical Therapy Treatment Patient Details Name: Erin Santiago MRN: [id number] DOB: 1955-07-25 Today's Date: 08/14/2015    History of Present Illness 60 yo female admitted 2/23 with abdominal pain, h/o endometrial stromal sarcome, underwent surgery in February. S/P placement  of drain for abcess.    PT Comments    Pt feeling better and c/o less ABD pain since L LQ drain was placed.  Pt able to self rise from recliner and holds her IV pole as she ambs to bathroom.  Also tolerated amb a great distance in hallway.    Follow Up Recommendations  Home health PT;Supervision - Intermittent;No PT follow up     Equipment Recommendations  Rolling walker with 5" wheels    Recommendations for Other Services       Precautions / Restrictions Precautions Precautions: Fall Precaution Comments: R + L ABD drain Restrictions Weight Bearing Restrictions: No    Mobility  Bed Mobility               General bed mobility comments: OOB in recliner  Transfers Overall transfer level: Needs assistance Equipment used: None Transfers: Stand Pivot Transfers Sit to Stand: Supervision Stand pivot transfers: Supervision       General transfer comment: A for lines  Ambulation/Gait Ambulation/Gait assistance: Supervision Ambulation Distance (Feet): 255 Feet Assistive device: Rolling walker (2 wheeled) Gait Pattern/deviations: Step-to pattern;Step-through pattern;Trunk flexed Gait velocity: WFL   General Gait Details: good alternating gait/tolerated well   Stairs            Wheelchair Mobility    Modified Rankin (Stroke Patients Only)       Balance                                    Cognition Arousal/Alertness: Awake/alert Behavior During Therapy: WFL for tasks assessed/performed                        Exercises      General Comments        Pertinent Vitals/Pain Pain Assessment: No/denies pain (pt stated her pain was much less now)     Home Living                      Prior Function            PT Goals (current goals can now be found in the care plan section) Progress towards PT goals: Progressing toward goals    Frequency  Min 3X/week    PT Plan Current plan remains appropriate    Co-evaluation             End of Session Equipment Utilized During Treatment: Gait belt Activity Tolerance: Patient tolerated treatment well Patient left: in chair;with call bell/phone within reach     Time: 0950-1015 PT Time Calculation (min) (ACUTE ONLY): 25 min  Charges:  $Gait Training: 8-22 mins $Therapeutic Exercise: 8-22 mins                    G Codes:      Rica Koyanagi  PTA [facility]  Acute  Rehab Pager      [phone]

## 2015-08-15 LAB — CULTURE, ROUTINE-ABSCESS: SPECIAL REQUESTS: NORMAL

## 2015-08-15 LAB — CBC
HEMATOCRIT: 29.4 % — AB (ref 36.0–46.0)
Hemoglobin: 9.3 g/dL — ABNORMAL LOW (ref 12.0–15.0)
MCH: 26.6 pg (ref 26.0–34.0)
MCHC: 31.6 g/dL (ref 30.0–36.0)
MCV: 84 fL (ref 78.0–100.0)
PLATELETS: 461 10*3/uL — AB (ref 150–400)
RBC: 3.5 MIL/uL — AB (ref 3.87–5.11)
RDW: 15.1 % (ref 11.5–15.5)
WBC: 8.8 10*3/uL (ref 4.0–10.5)

## 2015-08-15 LAB — GLUCOSE, CAPILLARY
GLUCOSE-CAPILLARY: 145 mg/dL — AB (ref 65–99)
Glucose-Capillary: 129 mg/dL — ABNORMAL HIGH (ref 65–99)
Glucose-Capillary: 128 mg/dL — ABNORMAL HIGH (ref 65–99)

## 2015-08-15 MED ORDER — FAT EMULSION 20 % IV EMUL
240.0000 mL | INTRAVENOUS | Status: AC
  Administered 2015-08-15: 240 mL via INTRAVENOUS
  Filled 2015-08-15: qty 250

## 2015-08-15 MED ORDER — TRACE MINERALS CR-CU-MN-SE-ZN 10-1000-500-60 MCG/ML IV SOLN
INTRAVENOUS | Status: AC
  Administered 2015-08-15: 17:00:00 via INTRAVENOUS
  Filled 2015-08-15: qty 2280

## 2015-08-15 MED ORDER — OXYMETAZOLINE HCL 0.05 % NA SOLN
1.0000 | Freq: Two times a day (BID) | NASAL | Status: DC | PRN
  Administered 2015-08-15: 1 via NASAL
  Filled 2015-08-15: qty 15

## 2015-08-15 NOTE — Progress Notes (Signed)
Referring Physician(s): Kaylyn Lim  Supervising Physician: Jacqulynn Cadet  Chief Complaint:  Endometrial cancer Intra abd abscess Drain placed 2/24 Drain placed 3/8  Subjective:  Up in chair Eating some No pain; no N/V Outputs are still 50-60 daily  Allergies: Review of patient's allergies indicates no known allergies.  Medications: Prior to Admission medications   Medication Sig Start Date End Date Taking? Authorizing Provider  calcium carbonate (TUMS - DOSED IN MG ELEMENTAL CALCIUM) 500 MG chewable tablet Chew 2 tablets by mouth daily.    Yes Historical Provider, MD  dexamethasone (DECADRON) 4 MG tablet Take 2 tablets by mouth once a day on the day after chemotherapy and then take 2 tablets two times a day for 2 days. Take with food. 07/27/15  Yes Lennis Marion Downer, MD  docusate sodium (COLACE) 100 MG capsule Take 100 mg by mouth 2 (two) times daily as needed for mild constipation. Reported on 05/28/2015 07/17/14  Yes Historical Provider, MD  hydrocortisone 2.5 % cream Apply 1 application topically as needed (itch/psoriasis.). Reported on 07/17/2015 05/19/14  Yes Historical Provider, MD  lidocaine-prilocaine (EMLA) cream Apply to Porta-cath 1-2 hrs prior to access. Cover with [employer]. 08/18/14  Yes Lennis Marion Downer, MD  LORazepam (ATIVAN) 1 MG tablet Place 1/2 to 1 tablet under the tongue or swallow every 6 hours as needed for nausea 07/22/15  Yes Lennis P Livesay, MD  meloxicam (MOBIC) 15 MG tablet Take 15 mg by mouth daily.  06/09/14  Yes Historical Provider, MD  ondansetron (ZOFRAN) 8 MG tablet Take 1 tablet (8 mg total) by mouth every 8 (eight) hours as needed for nausea or vomiting. 07/22/15  Yes Lennis Marion Downer, MD  prochlorperazine (COMPAZINE) 10 MG tablet Take 1 tablet (10 mg total) by mouth every 6 (six) hours as needed (Nausea or vomiting). 07/27/15  Yes Lennis Marion Downer, MD  Psyllium 28.3 % POWD Take 1 packet by mouth daily as needed (for fiber). Reported on  05/26/2015   Yes Historical Provider, MD  Resveratrol 250 MG CAPS Take 250 mg by mouth 2 (two) times daily. Reported on 05/26/2015   Yes Historical Provider, MD  ferrous fumarate (HEMOCYTE) 325 (106 FE) MG TABS tablet Take 1 tab daily on an empty stomach with OJ or Vitamin C 500 mg Patient not taking: Reported on 07/09/2015 09/15/14   Lennis Marion Downer, MD  sucralfate (CARAFATE) 1 GM/10ML suspension Take 10 mLs (1 g total) by mouth 4 (four) times daily -  with meals and at bedtime. Patient not taking: Reported on 07/30/2015 05/20/15   [name], MD     Vital Signs: BP 133/62 mmHg  Pulse 86  Temp(Src) 98 F (36.7 C) (Oral)  Resp 18  Ht 5' (1.524 m)  Wt 213 lb 3 oz (96.7 kg)  BMI 41.63 kg/m2  SpO2 99%  LMP 02/04/2014  Physical Exam  Abdominal: Soft. Bowel sounds are normal.  Skin: Skin is warm and dry.  Sites of drains are clean and dry NT No bleeding Output Rt drain bag--- bloody and purulent 50 cc yesterday; 60 cc in bag now  Output Lt drain JP----serous with purulent flecks 60 cc yesterday; 15 cc in JP  Cxs ecoli  Wbc 8.8; afeb   Nursing note and vitals reviewed.   Imaging: Ct Abdomen Pelvis W Contrast  08/11/2015  CLINICAL DATA:  Followup endometrial stromal sarcoma and abscess. Recently began chemotherapy. Previous radiation therapy. EXAM: CT ABDOMEN AND PELVIS WITH CONTRAST TECHNIQUE: Multidetector CT imaging of  the abdomen and pelvis was performed using the standard protocol following bolus administration of intravenous contrast. CONTRAST:  170mL OMNIPAQUE IOHEXOL 300 MG/ML  SOLN COMPARISON:  08/04/2015 FINDINGS: Lower chest: Bibasilar atelectasis shows improvement since previous study. Mild cardiomegaly is stable. Hepatobiliary: No masses or other significant abnormality. Gallbladder is unremarkable. Pancreas: No mass, inflammatory changes, or other significant abnormality. Spleen: Within normal limits in size and appearance. Adrenals/Urinary Tract: No adrenal or renal  masses identified. No evidence of right-sided hydronephrosis. Mild left hydronephrosis and ureterectasis shows mild increase due to left iliac pelvic lymphadenopathy which is mildly increased since previous study. 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 Left colonic diverticulosis is again demonstrated, without evidence of diverticulitis. No evidence of bowel obstruction. A left lower quadrant percutaneous drainage catheter remains in place within the anterior portion of a pelvic fluid collection. This pelvic fluid collection currently measures 7.0 x 11.5 cm and is not simply changed in size when measured similarly on previous study. This is consistent with abscess. Vascular/Lymphatic: Portacaval lymph node on image 32 measures 1.4 cm and is not significant changed. Aorto-caval lymphadenopathy shows increase in size, currently measuring 3.4 by 4.3 cm on image 51 compared to 3.3 x 3.7 cm previously. Left common iliac lymphadenopathy measures 2.2 cm on image 58 and shows no significant change in size. Pelvic iliac lymphadenopathy obstructing the distal left ureter currently measures 2.1 x 3.1 cm on image 77 compared to 2.0 x 2.6 cm previously. Right external iliac lymphadenopathy measures 2.2 cm on image 72 compared to 1.7 cm previously. Right obturator internus lymphadenopathy measures 1.4 cm on image 82 compared to 1.2 cm previously. Reproductive: Prior hysterectomy noted. Above-described pelvic fluid collection involves the hysterectomy bed Other: None. Musculoskeletal:  No suspicious bone lesions identified. IMPRESSION: No significant change in large pelvic abscess measuring approximately 7 x 11.5 cm with percutaneous drainage catheter remaining in place. Overall mild progression of abdominal and pelvic lymphadenopathy, consistent with mild progression of metastatic disease. Mild increase in left hydroureteronephrosis due to metastatic lymphadenopathy in the left lower pelvis.  Electronically Signed   By: Earle Gell M.D.   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 Patient underwent technically successful percutaneous drainage catheter placement into the dominant component within the right lower abdominal quadrant on 07/31/2015, though subsequent imaging has demonstrated a persistent fluid collection with the lower pelvis. As such, the patient presents today for attempted placement of additional percutaneous drainage catheter into the residual pelvic fluid collection. EXAM: CT IMAGE GUIDED DRAINAGE BY PERCUTANEOUS CATHETER COMPARISON:  None. MEDICATIONS: The patient is currently admitted to the hospital and receiving intravenous antibiotics. The antibiotics were administered within an appropriate time frame prior to the initiation of the procedure. ANESTHESIA/SEDATION: Moderate (conscious) sedation was employed during this procedure. A total of Versed 4 mg and Fentanyl 100 mcg was administered intravenously. Moderate Sedation Time: 22 minutes. The patient's level of consciousness and vital signs were monitored continuously by radiology nursing throughout the procedure under my direct supervision. CONTRAST:  None COMPLICATIONS: None immediate. PROCEDURE: Informed written consent was obtained from the patient after a discussion of the risks, benefits and alternatives to treatment. The patient was placed supine on the CT gantry and a pre procedural CT was performed re-demonstrating the known abscess/fluid collection within the midline of the lower pelvis with dominant component measuring 7.7 x 5.5 cm. The procedure was planned. A timeout  was performed prior to the initiation of the procedure. The skin overlying the left lower anterior abdomen/pelvic was prepped and draped in the usual sterile fashion. The overlying soft tissues were anesthetized with 1%  lidocaine with epinephrine. Appropriate trajectory was planned with the use of a 22 gauge spinal needle. An 18 gauge trocar needle was advanced into the abscess/fluid collection and a short Amplatz super stiff wire was coiled within the collection. Appropriate positioning was confirmed with a limited CT scan. The tract was serially dilated allowing placement of a 10 [place] all-purpose drainage catheter. Appropriate positioning was confirmed with a limited postprocedural CT scan. Approximately 220 Ml of purulent fluid was aspirated. The tube was connected to a JP bulb and sutured in place. A dressing was placed. The patient tolerated the procedure well without immediate post procedural complication. IMPRESSION: Successful CT guided placement of a 10 French all purpose drain catheter into the lower pelvis via left anterior lateral approach with aspiration of 220 mL of purulent fluid. Samples were sent to the laboratory as requested by the ordering clinical team. Electronically Signed   By: Sandi Mariscal M.D.   On: 08/12/2015 16:50    Labs:  CBC:  Recent Labs  08/10/15 0604 08/12/15 0539 08/13/15 0822 08/15/15 0612  WBC 9.7 9.2 9.5 8.8  HGB 10.2* 9.3* 9.7* 9.3*  HCT 33.1* 29.6* 30.7* 29.4*  PLT 475* 410* 465* 461*    COAGS:  Recent Labs  08/26/14 1300 07/30/15 2242 07/31/15 0428 08/12/15 0539  INR 0.95 1.49 1.41 1.35  APTT 27  --  32  --     BMP:  Recent Labs  08/08/15 0620 08/10/15 0604 08/12/15 0539 08/13/15 0822  NA 136 135 140 139  K 4.0 4.8 4.1 3.9  CL 104 104 103 104  CO2 25 24 26 25   GLUCOSE 125* 119* 129* 113*  BUN 13 17 17 18   CALCIUM 8.4* 8.4* 8.9 8.8*  CREATININE 0.56 0.69 0.54 0.67  GFRNONAA >60 >60 >60 >60  GFRAA >60 >60 >60 >60    LIVER FUNCTION TESTS:  Recent Labs  08/05/15 0506 08/06/15 0513 08/10/15 0604 08/13/15 0822  BILITOT 2.6* 1.4* 1.3* 1.0  AST 11* 11* 21 13*  ALT 10* 10* 8* 11*  ALKPHOS 48 50 70 101  PROT 6.2* 6.1* 6.8 7.1  ALBUMIN  2.5* 2.5* 2.8* 2.7*    Assessment and Plan:  Intra abdominal abscesses Plan per CCS Will need Re CT when output minimal   Electronically Signed: Pegge Cumberledge A 08/15/2015, 10:25 AM   I spent a total of 15 Minutes at the the patient's bedside AND on the patient's hospital floor or unit, greater than 50% of which was counseling/coordinating care for abd abscesses

## 2015-08-15 NOTE — Progress Notes (Signed)
PARENTERAL NUTRITION CONSULT NOTE -   Pharmacy Consult for TPN Indication: Perforated bowel; intolerant to enteral feeds  No Known Allergies  Patient Measurements: Height: 5' (152.4 cm) Weight: 213 lb 3 oz (96.7 kg) IBW/kg (Calculated) : 45.5 Adjusted Body Weight: 58.5 kg Usual Weight: ~98kg  Vital Signs: Temp: 98 F (36.7 C) (03/11 0610) Temp Source: Oral (03/11 0610) BP: 133/62 mmHg (03/11 0610) Pulse Rate: 86 (03/11 0610) Intake/Output from previous day: 03/10 0701 - 03/11 0700 In: 25  Out: 110 [Drains:110] Intake/Output from this shift:    Labs:  Recent Labs  08/13/15 0822 08/15/15 0612  WBC 9.5 8.8  HGB 9.7* 9.3*  HCT 30.7* 29.4*  PLT 465* 461*    Recent Labs  08/13/15 0822  NA 139  K 3.9  CL 104  CO2 25  GLUCOSE 113*  BUN 18  CREATININE 0.67  CALCIUM 8.8*  MG 2.0  PHOS 3.3  PROT 7.1  ALBUMIN 2.7*  AST 13*  ALT 11*  ALKPHOS 101  BILITOT 1.0   Estimated Creatinine Clearance: 77.9 mL/min (by C-G formula based on Cr of 0.67).    Recent Labs  08/14/15 1735 08/14/15 2352 08/15/15 0608  GLUCAP 103* 117* 145*   Medical History: Past Medical History  Diagnosis Date  . Abnormal uterine bleeding   . Anemia   . Fibroid   . Heart murmur     under 5 yrs of age  . Cancer ([place]) dx'd 07/2014    endometrial stromal sarcoma  . Radiation 12/01/14-01/05/15    pelvis 45 gray  . Radiation 05/18/2015-06/05/15    T4 thoracic spine area 35 gray   Assessment: 60 yo F admitted 2/23 with abdominal pain and noted to have perforated bowel.  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  She has been on Zosyn since admission for IAI and Eraxis was added 2/28.   Clear liquid diet was attempted 2/25 however patient developed severe abdominal pain with nausea/vomiting.    Insulin Requirements during previous  day: on moderate SSI q6h (0 units of insulin in the past 24 hrs) - no hx DM  Current Nutrition: liquid diet - boost/Resource TID started on 3/6  IVF: none  Central access: implanted port 11/19/14 TPN start date: 08/05/15  ASSESSMENT                                                                                                          HPI: 60 yo F admitted 2/23 with abdominal pain and noted to have perforated bowel.  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 She has been on Zosyn since admission for IAI and Eraxis was added 2/28.   Clear liquid diet was attempted 2/25 however patient developed severe abdominal pain with nausea/vomiting.    Significant events:  3/1: IR consulted for drain adjustment (bulb changed)  3/3: TPN advanced to goal rate 3/5: abscess drainage decreased, 85 ml/24 hr, + BM's 3/6: patient reported BM this morning and eating about ~75% of meals (not documented in [facility]), Resource started 3/7 abd CT: no signif change in pelvic abscess, metastatic lymphadenopathy in the left lower pelvis. 3/8 drainage catheter in pelvis placed by IR  Today:   Glucose (goal cbgs <150): all cbgs <150  Electrolytes: 3/9 - lytes wnl   Renal:  stable at patient's baseline  LFTs: TBili elevated but trending down (1.3).  Patient does not appear jaundiced.   TGs: 188 (3/2), 115 (3/6)  Prealbumin: 6.4 (3/2), 13.7 (3/6)  NUTRITIONAL GOALS                                                                                             RD recs (3/8): Kcal: 1950-2150 (20-22 kcal/kg) Protein: 120-140 grams of protein (1.25-1.45 grams/kg) Fluid: 2 L/day  Clinimix E 5/15 at a goal rate of 95 ml/hr + 20% fat emulsion at 10 ml/hr to provide: 114g/day protein, 2099 Kcal/day.  PLAN                                                                                                                          At 1800 today:  continue Clinimix E 5/15 at 95 ml/hr   20% fat emulsion at 10 ml/hr.  Follow-up how tolerates diet and ability to stop TPN.    TPN to contain standard multivitamins and trace elements.  Continue moderate SSI q6h  TPN lab panels on Mondays & Thursdays.  F/u daily.  Dolly Rias RPh 08/15/2015, 11:56 AM Pager [phone]

## 2015-08-15 NOTE — Progress Notes (Signed)
[facility] PROGRESS NOTE  Erin Santiago [id number] DOB: [birth year] DOA: 07/30/2015 PCP: Merrilee Seashore, MD  Brief interval history  Erin Santiago is a 60 y.o. 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 Patient is status post chemotherapy and radiation to the pelvis and back and was due to start chemotherapy on the day of admission due to progression of disease involving the left abdominal pain. Presented to the ED with complaints of 1-2 day history of diffuse abdominal pain. CT abdomen and pelvis showed bowel perforation. Large 9 x 4.5 cm complex right lower quadrant and pelvic abscess containing a small amount of contrast suggesting communication with the distal ileum. general surgery consulted recommended initially nonoperative percutaneous drainage.She was placed on bowel rest and monitored. Interventional radiology was also consulted for drain placement. Patient had a percutaneous drain placed on 07/31/2015 and was placed empirically on IV Zosyn from admission. Wound/abscess cultures grew out Escherichia coli. Started on clears, Repeat CT abdomen and pelvis pending for 08/04/2015  Assessment/Plan: #1 Bowel perforation, complex intra-abdominal fluid collection 9 x 4.5 cm right lower quadrant and pelvic -likely due to tumor invasion -Per CT abdomen and pelvis appears that perforation may have walled off and contained.  - general surgery folowing, recommending nonoperative percutaneous drainage placement. S/p RLQ percutaneous drain 07/31/2015 per IR.  Wound/abscess cultures growing Escherichia coli. She has been on IV Zosyn.  -Gen. surgery and interventional radiology following. -On 08/11/2015 repeat CT scan of abdomen and pelvis did not reveal significant change in large pelvic abscess per radiology, measuring 7 x 11.5 cm -3/8 underwent CT  guided placed of a 10 Fr drainage catheter placement into the pelvis via the left lower abdomen yielding 220 cc of purulent material.  -diet advanced to Full liquids, tolerating this, on TNA too -plan for repeat CT next week  #2 history of iron deficiency anemia -Stable, monitor periodically  #3 dehydration/metabolic acidosis -Resolving. She was treated with IV fluids.   #4 hypokalemia -Potassium improved   #5 lactic acidosis Likely secondary to problem #1 and dehydration.   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 Patient status post chemotherapy and radiation to the pelvis. Patient was to start chemotherapy on the day of admission due to progression of disease involving the left, iliac adenopathy, 2 masses in the right lower quadrant seen on CT scan on 07/21/2015.   -She had a repeat CT scan on 08/11/2015 that revealed mild progression of abdominal pelvic lymphadenopathy consistent with progression of metastatic disease. -Radiology also revealed increase in mild hydronephrosis due to metastatic lymphadenopathy. Renal function remains stable.  -Dr. Marko Plume of medical oncology following, overall prognosis is poor  #7 prophylaxis SCDs for DVT prophylaxis.  Code Status: Full Family Communication: Updated patient. No family at bedside. Disposition Plan: not ready for discharge   Consultants:  Gen. surgery: Dr. Excell Seltzer 07/30/2015   oncology: Dr. Marko Plume 07/31/2015  Interventional radiology  Procedures:  CT abdomen and pelvis 07/30/2015  Acute abdominal series 07/30/2015  Right lower quadrant abdominal abscess drain with CT 07/31/2015 for Dr. Annamaria Boots  Antibiotics:  IV Zosyn 07/30/2015  HPI/Subjective: Patient sitting at bedside chair, some blood in stools  Objective: Filed Vitals:   08/14/15 1915 08/15/15 0610  BP: 183/66 133/62   Pulse: 78 86  Temp: 98.8 F (37.1 C) 98 F (36.7  C)  Resp:  18    Intake/Output Summary (Last 24 hours) at 08/15/15 0948 Last data filed at 08/15/15 0539  Gross per 24 hour  Intake     25 ml  Output    110 ml  Net    -85 ml   Filed Weights   08/06/15 0500 08/07/15 0500 08/07/15 1536  Weight: 97.2 kg (214 lb 4.6 oz) 97.07 kg (214 lb) 96.7 kg (213 lb 3 oz)    Exam:   General:  NAD, sitting at bedside chair, nontoxic  Cardiovascular: RRR  Respiratory: Normal respiratory effort, lungs are clear to auscultation bilaterally  Abdomen: Soft, nontender, nondistended. 2 drains noted with blooding fluid  Musculoskeletal: No clubbing cyanosis or edema.   Data Reviewed: Basic Metabolic Panel:  Recent Labs Lab 08/10/15 0604 08/12/15 0539 08/13/15 0822  NA 135 140 139  K 4.8 4.1 3.9  CL 104 103 104  CO2 24 26 25   GLUCOSE 119* 129* 113*  BUN 17 17 18   CREATININE 0.69 0.54 0.67  CALCIUM 8.4* 8.9 8.8*  MG 2.3  --  2.0  PHOS 3.1  --  3.3   Liver Function Tests:  Recent Labs Lab 08/10/15 0604 08/13/15 0822  AST 21 13*  ALT 8* 11*  ALKPHOS 70 101  BILITOT 1.3* 1.0  PROT 6.8 7.1  ALBUMIN 2.8* 2.7*   No results for input(s): LIPASE, AMYLASE in the last 168 hours. No results for input(s): AMMONIA in the last 168 hours. CBC:  Recent Labs Lab 08/10/15 0604 08/12/15 0539 08/13/15 0822 08/15/15 0612  WBC 9.7 9.2 9.5 8.8  NEUTROABS 8.0*  --   --   --   HGB 10.2* 9.3* 9.7* 9.3*  HCT 33.1* 29.6* 30.7* 29.4*  MCV 87.3 86.5 86.5 84.0  PLT 475* 410* 465* 461*   Cardiac Enzymes: No results for input(s): CKTOTAL, CKMB, CKMBINDEX, TROPONINI in the last 168 hours. BNP (last 3 results) No results for input(s): BNP in the last 8760 hours.  ProBNP (last 3 results) No results for input(s): PROBNP in the last 8760 hours.  CBG:  Recent Labs Lab 08/14/15 0532 08/14/15 1152 08/14/15 1735 08/14/15 2352 08/15/15 0608  GLUCAP 108* 106* 103* 117* 145*    Recent  Results (from the past 240 hour(s))  Culture, routine-abscess     Status: None   Collection Time: 08/12/15  4:18 PM  Result Value Ref Range Status   Specimen Description DRAINAGE LEFT LOWER ABDOMEN  Final   Special Requests Normal  Final   Gram Stain   Final    ABUNDANT WBC PRESENT,BOTH PMN AND MONONUCLEAR NO SQUAMOUS EPITHELIAL CELLS SEEN ABUNDANT GRAM VARIABLE ROD Performed at [facility]    Culture   Final    FEW ESCHERICHIA COLI Performed at [facility]    Report Status 08/15/2015 FINAL  Final   Organism ID, Bacteria ESCHERICHIA COLI  Final      Susceptibility   Escherichia coli - MIC*    AMPICILLIN <=2 SENSITIVE Sensitive     AMPICILLIN/SULBACTAM <=2 SENSITIVE Sensitive     CEFEPIME <=1 SENSITIVE Sensitive     CEFTAZIDIME <=1 SENSITIVE Sensitive     CEFTRIAXONE <=1 SENSITIVE Sensitive     CIPROFLOXACIN <=0.25 SENSITIVE Sensitive     GENTAMICIN <=1 SENSITIVE Sensitive     IMIPENEM <=0.25 SENSITIVE Sensitive     PIP/TAZO <=4 SENSITIVE Sensitive     TOBRAMYCIN <=1 SENSITIVE Sensitive     TRIMETH/SULFA Value in next row Sensitive      <=  20 SENSITIVE(NOTE)    * FEW ESCHERICHIA COLI     Studies: No results found.  Scheduled Meds: . acetaminophen  1,000 mg Oral TID  . antiseptic oral rinse  7 mL Mouth Rinse BID  . feeding supplement  1 Container Oral TID BM  . insulin aspart  0-15 Units Subcutaneous 4 times per day  . nystatin  5 mL Oral QID  . nystatin   Topical BID  . piperacillin-tazobactam (ZOSYN)  IV  3.375 g Intravenous 3 times per day  . saccharomyces boulardii  250 mg Oral BID  . sodium chloride flush  3 mL Intravenous Q12H   Continuous Infusions: . Marland KitchenTPN (CLINIMIX-E) Adult 95 mL/hr at 08/14/15 1750   And  . 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 If 7PM-7AM, please contact night-coverage at [email or web address], password [facility] 08/15/2015, 9:48 AM  LOS: 16 days

## 2015-08-15 NOTE — Progress Notes (Signed)
Patient ID: Erin Santiago, female   DOB: 1955-07-03, 59 y.o.   MRN: [id number] [facility] Surgery Progress Note:   * No surgery found *  Subjective: Mental status is clear and pleasant.  Feeling much better than when drains were placed Objective: Vital signs in last 24 hours: Temp:  [98 F (36.7 C)-98.8 F (37.1 C)] 98 F (36.7 C) (03/11 0610) Pulse Rate:  [78-86] 86 (03/11 0610) Resp:  [18] 18 (03/11 0610) BP: (124-183)/(62-66) 133/62 mmHg (03/11 0610) SpO2:  [99 %] 99 % (03/11 0610)  Intake/Output from previous day: 03/10 0701 - 03/11 0700 In: 25  Out: 110 [Drains:110] Intake/Output this shift:    Physical Exam: Work of breathing is normal.  Right drain has more enteric contents;  Left JP serosanguinous  Lab Results:  Results for orders placed or performed during the hospital encounter of 07/30/15 (from the past 48 hour(s))  Glucose, capillary     Status: Abnormal   Collection Time: 08/13/15 12:19 PM  Result Value Ref Range   Glucose-Capillary 123 (H) 65 - 99 mg/dL  Glucose, capillary     Status: None   Collection Time: 08/13/15  6:29 PM  Result Value Ref Range   Glucose-Capillary 87 65 - 99 mg/dL  Glucose, capillary     Status: Abnormal   Collection Time: 08/13/15 11:57 PM  Result Value Ref Range   Glucose-Capillary 110 (H) 65 - 99 mg/dL  Iron and TIBC     Status: Abnormal   Collection Time: 08/14/15  5:25 AM  Result Value Ref Range   Iron 39 28 - 170 ug/dL   TIBC 199 (L) 250 - 450 ug/dL   Saturation Ratios 20 10.4 - 31.8 %   UIBC 160 ug/dL    Comment: Performed at [facility]  Glucose, capillary     Status: Abnormal   Collection Time: 08/14/15  5:32 AM  Result Value Ref Range   Glucose-Capillary 108 (H) 65 - 99 mg/dL  Glucose, capillary     Status: Abnormal   Collection Time: 08/14/15 11:52 AM  Result Value Ref Range   Glucose-Capillary 106 (H) 65 - 99 mg/dL   Comment 1 Notify RN   Glucose, capillary     Status: Abnormal   Collection Time:  08/14/15  5:35 PM  Result Value Ref Range   Glucose-Capillary 103 (H) 65 - 99 mg/dL   Comment 1 Notify RN   Glucose, capillary     Status: Abnormal   Collection Time: 08/14/15 11:52 PM  Result Value Ref Range   Glucose-Capillary 117 (H) 65 - 99 mg/dL  Glucose, capillary     Status: Abnormal   Collection Time: 08/15/15  6:08 AM  Result Value Ref Range   Glucose-Capillary 145 (H) 65 - 99 mg/dL  CBC     Status: Abnormal   Collection Time: 08/15/15  6:12 AM  Result Value Ref Range   WBC 8.8 4.0 - 10.5 K/uL   RBC 3.50 (L) 3.87 - 5.11 MIL/uL   Hemoglobin 9.3 (L) 12.0 - 15.0 g/dL   HCT 29.4 (L) 36.0 - 46.0 %   MCV 84.0 78.0 - 100.0 fL   MCH 26.6 26.0 - 34.0 pg   MCHC 31.6 30.0 - 36.0 g/dL   RDW 15.1 11.5 - 15.5 %   Platelets 461 (H) 150 - 400 K/uL    Radiology/Results: No results found.  Anti-infectives: Anti-infectives    Start     Dose/Rate Route Frequency Ordered Stop   08/07/15 1400  piperacillin-tazobactam (ZOSYN) IVPB 3.375 g     3.375 g 12.5 mL/hr over 240 Minutes Intravenous 3 times per day 08/07/15 0905     08/07/15 0600  ciprofloxacin (CIPRO) tablet 500 mg  Status:  Discontinued     500 mg Oral 2 times daily 08/07/15 0541 08/07/15 0842   08/06/15 0830  fluconazole (DIFLUCAN) IVPB 100 mg  Status:  Discontinued     100 mg 50 mL/hr over 60 Minutes Intravenous Every 24 hours 08/06/15 0819 08/06/15 0846   08/05/15 1600  anidulafungin (ERAXIS) 100 mg in sodium chloride 0.9 % 100 mL IVPB     100 mg over 90 Minutes Intravenous Every 24 hours 08/04/15 1509 08/07/15 2039   08/04/15 1600  anidulafungin (ERAXIS) 200 mg in sodium chloride 0.9 % 200 mL IVPB    Comments:  Pharmacy may adjust dosing strength, schedule, rate of infusion, etc as needed to optimize therapy   200 mg over 180 Minutes Intravenous  Once 08/04/15 1509 08/04/15 1928   07/30/15 1645  piperacillin-tazobactam (ZOSYN) IVPB 3.375 g  Status:  Discontinued     3.375 g 12.5 mL/hr over 240 Minutes Intravenous 3  times per day 07/30/15 1634 08/07/15 0541   07/30/15 1545  piperacillin-tazobactam (ZOSYN) IVPB 3.375 g     3.375 g 100 mL/hr over 30 Minutes Intravenous  Once 07/30/15 1541 07/30/15 1757      Assessment/Plan: Problem List: Patient Active Problem List   Diagnosis Date Noted  . Protein-calorie malnutrition, moderate ([place]) 08/09/2015  . Bowel perforation ([place]) 07/30/2015  . Abscess of abdominal cavity ([place])   . Intra-abdominal abscess ([place])   . Diverticulitis of large intestine without perforation or abscess without bleeding 06/27/2015  . Metastatic cancer to spine ([place]) 05/12/2015  . 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 Port catheter in place 02/07/2015  . 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 Antineoplastic chemotherapy induced anemia 11/08/2014  . Bilateral leg edema 11/08/2014  . SOB (shortness of breath) on exertion 11/08/2014  . Skin changes related to chemotherapy 10/10/2014  . Chemotherapy induced neutropenia ([place]) 09/16/2014  . Portacath in place 09/16/2014  . Thrush 09/16/2014  . Chemotherapy induced thrombocytopenia   . Neutropenic fever ([place]) 09/10/2014  . Thrombocytopenia ([place]) 09/10/2014  . Dehydration   . Myalgia 09/08/2014  . Mucositis due to chemotherapy 09/08/2014  . Morbid obesity with BMI of 40.0-44.9, adult ([place]) 08/20/2014  . Poor venous access 08/20/2014  . Hx of laparoscopic gastric banding 08/20/2014  . Iron deficiency anemia due to chronic blood loss 08/20/2014  . Alopecia 08/20/2014  . Arthritis of both knees 08/20/2014  . 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 Leukocytosis   . Vaginal bleeding 07/04/2014  . Fibroid 07/04/2014  . Postmenopausal vaginal bleeding 07/04/2014  . Anemia     Etiology of small bowel leak uncertain;  Patient's symptoms are well controlled at present.  * No surgery found *    LOS: 16 days   Matt B. Hassell Done, MD, [facility] [phone]  beeper [phone]  08/15/2015 10:08 AM

## 2015-08-16 LAB — BASIC METABOLIC PANEL
Anion gap: 10 (ref 5–15)
BUN: 19 mg/dL (ref 6–20)
CALCIUM: 8.8 mg/dL — AB (ref 8.9–10.3)
CO2: 27 mmol/L (ref 22–32)
CREATININE: 0.53 mg/dL (ref 0.44–1.00)
Chloride: 105 mmol/L (ref 101–111)
Glucose, Bld: 103 mg/dL — ABNORMAL HIGH (ref 65–99)
Potassium: 4.2 mmol/L (ref 3.5–5.1)
SODIUM: 142 mmol/L (ref 135–145)

## 2015-08-16 LAB — CBC
HCT: 28.4 % — ABNORMAL LOW (ref 36.0–46.0)
HEMOGLOBIN: 8.9 g/dL — AB (ref 12.0–15.0)
MCH: 27.4 pg (ref 26.0–34.0)
MCHC: 31.3 g/dL (ref 30.0–36.0)
MCV: 87.4 fL (ref 78.0–100.0)
PLATELETS: 413 10*3/uL — AB (ref 150–400)
RBC: 3.25 MIL/uL — ABNORMAL LOW (ref 3.87–5.11)
RDW: 15.2 % (ref 11.5–15.5)
WBC: 10 10*3/uL (ref 4.0–10.5)

## 2015-08-16 LAB — GLUCOSE, CAPILLARY
GLUCOSE-CAPILLARY: 110 mg/dL — AB (ref 65–99)
GLUCOSE-CAPILLARY: 116 mg/dL — AB (ref 65–99)
Glucose-Capillary: 104 mg/dL — ABNORMAL HIGH (ref 65–99)
Glucose-Capillary: 105 mg/dL — ABNORMAL HIGH (ref 65–99)

## 2015-08-16 MED ORDER — PSEUDOEPHEDRINE HCL 30 MG PO TABS
30.0000 mg | ORAL_TABLET | Freq: Four times a day (QID) | ORAL | Status: DC | PRN
  Administered 2015-08-16: 30 mg via ORAL
  Filled 2015-08-16 (×2): qty 1

## 2015-08-16 MED ORDER — TRACE MINERALS CR-CU-MN-SE-ZN 10-1000-500-60 MCG/ML IV SOLN
INTRAVENOUS | Status: AC
  Administered 2015-08-16: 18:00:00 via INTRAVENOUS
  Filled 2015-08-16: qty 960

## 2015-08-16 MED ORDER — OXYMETAZOLINE HCL 0.05 % NA SOLN
1.0000 | Freq: Two times a day (BID) | NASAL | Status: DC | PRN
  Filled 2015-08-16: qty 15

## 2015-08-16 MED ORDER — FAT EMULSION 20 % IV EMUL
240.0000 mL | INTRAVENOUS | Status: AC
  Administered 2015-08-16: 240 mL via INTRAVENOUS
  Filled 2015-08-16: qty 240

## 2015-08-16 NOTE — Progress Notes (Signed)
[facility] PROGRESS NOTE  Erin Santiago [id number] DOB: 11-Apr-1956 DOA: 07/30/2015 PCP: Merrilee Seashore, MD  Brief interval history  Erin Santiago is a 60 y.o. 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 Patient is status post chemotherapy and radiation to the pelvis and back and was due to start chemotherapy on the day of admission due to progression of disease involving the left abdominal pain. Presented to the ED with complaints of 1-2 day history of diffuse abdominal pain. CT abdomen and pelvis showed bowel perforation. Large 9 x 4.5 cm complex right lower quadrant and pelvic abscess containing a small amount of contrast suggesting communication with the distal ileum. general surgery consulted recommended initially nonoperative percutaneous drainage.She was placed on bowel rest and monitored. Interventional radiology was also consulted for drain placement. Patient had a percutaneous drain placed on 07/31/2015 and was placed empirically on IV Zosyn from admission. Wound/abscess cultures grew out Escherichia coli. Started on clears, Repeat CT abdomen and pelvis pending for 08/04/2015  Assessment/Plan: #1 Bowel perforation, complex intra-abdominal fluid collection 9 x 4.5 cm right lower quadrant and pelvic -likely due to tumor invasion -Per CT abdomen and pelvis appears that perforation may have walled off and contained.  -S/p RLQ percutaneous drain 07/31/2015 per IR.  Wound/abscess cultures growing Escherichia coli. She has been on IV Zosyn.  -Gen. surgery and interventional radiology following. -On 08/11/2015 repeat CT scan of abdomen and pelvis did not reveal significant change in large pelvic abscess per radiology, measuring 7 x 11.5 cm -3/8 underwent CT guided placed of a 10 Fr drainage catheter placement into the pelvis via the left  lower abdomen yielding 220 cc of purulent material.  -diet advanced to Full liquids, tolerating this, on TNA too -plan for repeat CT next week  #2 history of iron deficiency anemia -Stable, monitor periodically  #3 dehydration/metabolic acidosis -Resolving. She was treated with IV fluids.   #4 hypokalemia -Potassium improved   #5 lactic acidosis Likely secondary to problem #1 and dehydration.   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 Patient status post chemotherapy and radiation to the pelvis. Patient was to start chemotherapy on the day of admission due to progression of disease involving the left, iliac adenopathy, 2 masses in the right lower quadrant seen on CT scan on 07/21/2015.   -She had a repeat CT scan on 08/11/2015 that revealed mild progression of abdominal pelvic lymphadenopathy consistent with progression of metastatic disease. -Radiology also revealed increase in mild hydronephrosis due to metastatic lymphadenopathy. Renal function remains stable.  -Dr. Marko Plume of medical oncology following, overall prognosis is poor  #7 Sinus congestion pseudoephedrine PRN  prophylaxis SCDs for DVT prophylaxis.  Code Status: Full Family Communication: Updated patient. No family at bedside. Disposition Plan: not ready for discharge   Consultants:  Gen. surgery: Dr. Excell Seltzer 07/30/2015   oncology: Dr. Marko Plume 07/31/2015  Interventional radiology  Procedures:  CT abdomen and pelvis 07/30/2015  Acute abdominal series 07/30/2015  Right lower quadrant abdominal abscess drain with CT 07/31/2015 for Dr. Annamaria Boots  Antibiotics:  IV Zosyn 07/30/2015  HPI/Subjective: Patient sitting at bedside chair, some blood in stools  Objective: Filed Vitals:   08/15/15 2019 08/16/15 0657  BP: 122/76 105/77  Pulse: 86 79  Temp: 98.6 F (37 C) 98.5  F (36.9 C)  Resp: 16  16    Intake/Output Summary (Last 24 hours) at 08/16/15 1207 Last data filed at 08/16/15 0700  Gross per 24 hour  Intake    255 ml  Output    130 ml  Net    125 ml   Filed Weights   08/06/15 0500 08/07/15 0500 08/07/15 1536  Weight: 97.2 kg (214 lb 4.6 oz) 97.07 kg (214 lb) 96.7 kg (213 lb 3 oz)    Exam:   General:  NAD, sitting at bedside chair, nontoxic  Cardiovascular: RRR  Respiratory: Normal respiratory effort, lungs are clear to auscultation bilaterally  Abdomen: Soft, nontender, nondistended. 2 drains noted with blooding fluid  Musculoskeletal: No clubbing cyanosis or edema.   Data Reviewed: Basic Metabolic Panel:  Recent Labs Lab 08/10/15 0604 08/12/15 0539 08/13/15 0822 08/16/15 0623  NA 135 140 139 142  K 4.8 4.1 3.9 4.2  CL 104 103 104 105  CO2 24 26 25 27   GLUCOSE 119* 129* 113* 103*  BUN 17 17 18 19   CREATININE 0.69 0.54 0.67 0.53  CALCIUM 8.4* 8.9 8.8* 8.8*  MG 2.3  --  2.0  --   PHOS 3.1  --  3.3  --    Liver Function Tests:  Recent Labs Lab 08/10/15 0604 08/13/15 0822  AST 21 13*  ALT 8* 11*  ALKPHOS 70 101  BILITOT 1.3* 1.0  PROT 6.8 7.1  ALBUMIN 2.8* 2.7*   No results for input(s): LIPASE, AMYLASE in the last 168 hours. No results for input(s): AMMONIA in the last 168 hours. CBC:  Recent Labs Lab 08/10/15 0604 08/12/15 0539 08/13/15 0822 08/15/15 0612 08/16/15 0623  WBC 9.7 9.2 9.5 8.8 10.0  NEUTROABS 8.0*  --   --   --   --   HGB 10.2* 9.3* 9.7* 9.3* 8.9*  HCT 33.1* 29.6* 30.7* 29.4* 28.4*  MCV 87.3 86.5 86.5 84.0 87.4  PLT 475* 410* 465* 461* 413*   Cardiac Enzymes: No results for input(s): CKTOTAL, CKMB, CKMBINDEX, TROPONINI in the last 168 hours. BNP (last 3 results) No results for input(s): BNP in the last 8760 hours.  ProBNP (last 3 results) No results for input(s): PROBNP in the last 8760 hours.  CBG:  Recent Labs Lab 08/15/15 0608 08/15/15 1203 08/15/15 1822 08/16/15 0012  08/16/15 0648  GLUCAP 145* 129* 128* 116* 110*    Recent Results (from the past 240 hour(s))  Culture, routine-abscess     Status: None   Collection Time: 08/12/15  4:18 PM  Result Value Ref Range Status   Specimen Description DRAINAGE LEFT LOWER ABDOMEN  Final   Special Requests Normal  Final   Gram Stain   Final    ABUNDANT WBC PRESENT,BOTH PMN AND MONONUCLEAR NO SQUAMOUS EPITHELIAL CELLS SEEN ABUNDANT GRAM VARIABLE ROD Performed at [facility]    Culture   Final    FEW ESCHERICHIA COLI Performed at [facility]    Report Status 08/15/2015 FINAL  Final   Organism ID, Bacteria ESCHERICHIA COLI  Final      Susceptibility   Escherichia coli - MIC*    AMPICILLIN <=2 SENSITIVE Sensitive     AMPICILLIN/SULBACTAM <=2 SENSITIVE Sensitive     CEFEPIME <=1 SENSITIVE Sensitive     CEFTAZIDIME <=1 SENSITIVE Sensitive     CEFTRIAXONE <=1 SENSITIVE Sensitive     CIPROFLOXACIN <=0.25 SENSITIVE Sensitive     GENTAMICIN <=1 SENSITIVE Sensitive     IMIPENEM <=0.25 SENSITIVE Sensitive     PIP/TAZO <=4 SENSITIVE  Sensitive     TOBRAMYCIN <=1 SENSITIVE Sensitive     TRIMETH/SULFA Value in next row Sensitive      <=20 SENSITIVE(NOTE)    * FEW ESCHERICHIA COLI     Studies: No results found.  Scheduled Meds: . acetaminophen  1,000 mg Oral TID  . antiseptic oral rinse  7 mL Mouth Rinse BID  . feeding supplement  1 Container Oral TID BM  . insulin aspart  0-15 Units Subcutaneous 4 times per day  . nystatin  5 mL Oral QID  . nystatin   Topical BID  . piperacillin-tazobactam (ZOSYN)  IV  3.375 g Intravenous 3 times per day  . saccharomyces boulardii  250 mg Oral BID  . sodium chloride flush  3 mL Intravenous Q12H   Continuous Infusions: . Marland KitchenTPN (CLINIMIX-E) Adult 95 mL/hr at 08/15/15 1720   And  . fat emulsion 240 mL (08/15/15 1719)  . Marland KitchenTPN (CLINIMIX-E) Adult     And  . 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 If 7PM-7AM, please contact night-coverage at [email or web address], password [facility] 08/16/2015, 12:07 PM  LOS: 17 days

## 2015-08-16 NOTE — Progress Notes (Signed)
PARENTERAL NUTRITION CONSULT NOTE -   Pharmacy Consult for TPN Indication: Perforated bowel; intolerant to enteral feeds  No Known Allergies  Patient Measurements: Height: 5' (152.4 cm) Weight: 213 lb 3 oz (96.7 kg) IBW/kg (Calculated) : 45.5 Adjusted Body Weight: 58.5 kg Usual Weight: ~98kg  Vital Signs: Temp: 98.5 F (36.9 C) (03/12 0657) Temp Source: Oral (03/12 0657) BP: 105/77 mmHg (03/12 0657) Pulse Rate: 79 (03/12 0657) Intake/Output from previous day: 03/11 0701 - 03/12 0700 In: 255 [P.O.:240] Out: 130 [Drains:130] Intake/Output from this shift:    Labs:  Recent Labs  08/15/15 0612 08/16/15 0623  WBC 8.8 10.0  HGB 9.3* 8.9*  HCT 29.4* 28.4*  PLT 461* 413*    Recent Labs  08/16/15 0623  NA 142  K 4.2  CL 105  CO2 27  GLUCOSE 103*  BUN 19  CREATININE 0.53  CALCIUM 8.8*   Estimated Creatinine Clearance: 77.9 mL/min (by C-G formula based on Cr of 0.53).    Recent Labs  08/15/15 1822 08/16/15 0012 08/16/15 0648  GLUCAP 128* 116* 110*   Medical History: Past Medical History  Diagnosis Date  . Abnormal uterine bleeding   . Anemia   . Fibroid   . Heart murmur     under 5 yrs of age  . Cancer ([place]) dx'd 07/2014    endometrial stromal sarcoma  . Radiation 12/01/14-01/05/15    pelvis 45 gray  . Radiation 05/18/2015-06/05/15    T4 thoracic spine area 35 gray   Assessment: 60 yo F admitted 2/23 with abdominal pain and noted to have perforated bowel.  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  She has been on Zosyn since admission for IAI and Eraxis was added 2/28.   Clear liquid diet was attempted 2/25 however patient developed severe abdominal pain with nausea/vomiting.    Insulin Requirements during previous day: on moderate SSI q6h (4 units of insulin in the past 24 hrs) - no hx  DM  Current Nutrition: liquid diet - boost/Resource TID started on 3/6   IVF: none  Central access: implanted port 11/19/14 TPN start date: 08/05/15  ASSESSMENT                                                                                                          HPI: 60 yo F admitted 2/23 with abdominal pain and noted to have perforated bowel.  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 She has been on Zosyn since admission for IAI and Eraxis was added 2/28.   Clear liquid diet was attempted 2/25 however patient developed severe abdominal pain with nausea/vomiting.    Significant events:  3/1: IR consulted for drain adjustment (bulb changed) 3/3: TPN advanced to goal rate 3/5: abscess drainage decreased, 85 ml/24 hr, + BM's 3/6: patient reported BM this morning and eating  about ~75% of meals (not documented in [facility]), Resource started 3/7 abd CT: no signif change in pelvic abscess, metastatic lymphadenopathy in the left lower pelvis. 3/8 drainage catheter in pelvis placed by IR 3/12 pt feels full, attributes to TPN  Today:   Glucose (goal cbgs <150): all cbgs <150  Electrolytes: 3/11 - lytes wnl   Renal:  stable at patient's baseline  LFTs: TBili WNL.   TGs: 188 (3/2), 115 (3/6)  Prealbumin: 6.4 (3/2), 13.7 (3/6)  NUTRITIONAL GOALS                                                                                             RD recs (3/8): Kcal: 1950-2150 (20-22 kcal/kg) Protein: 120-140 grams of protein (1.25-1.45 grams/kg) Fluid: 2 L/day  Clinimix E 5/15 at a goal rate of 95 ml/hr + 20% fat emulsion at 10 ml/hr to provide: 114g/day protein, 2099 Kcal/day.  PLAN                                                                                                                         At 1800 today:  decrease  Clinimix E 5/15 at 40 ml/hr per surgery  20% fat emulsion at 10 ml/hr.  Follow-up how tolerates diet and ability to stop TPN.    TPN to contain standard multivitamins and trace elements.  Continue moderate SSI q6h  TPN lab panels on Mondays & Thursdays.  F/u daily.  Dolly Rias RPh 08/16/2015, 11:02 AM Pager [phone]

## 2015-08-16 NOTE — Progress Notes (Signed)
  Subjective: She feels full which she attributes to the tpn. Tolerating full liquids  Objective: Vital signs in last 24 hours: Temp:  [98.5 F (36.9 C)-98.6 F (37 C)] 98.5 F (36.9 C) (03/12 0657) Pulse Rate:  [79-88] 79 (03/12 0657) Resp:  [15-16] 16 (03/12 0657) BP: (105-122)/(57-77) 105/77 mmHg (03/12 0657) SpO2:  [100 %] 100 % (03/12 0657) Last BM Date: 08/14/15  Intake/Output from previous day: 03/11 0701 - 03/12 0700 In: 255 [P.O.:240] Out: 130 [Drains:130] Intake/Output this shift:    Resp: clear to auscultation bilaterally Cardio: regular rate and rhythm GI: soft, minimal tenderness. drains intact  Lab Results:   Recent Labs  08/15/15 0612 08/16/15 0623  WBC 8.8 10.0  HGB 9.3* 8.9*  HCT 29.4* 28.4*  PLT 461* 413*   BMET  Recent Labs  08/16/15 0623  NA 142  K 4.2  CL 105  CO2 27  GLUCOSE 103*  BUN 19  CREATININE 0.53  CALCIUM 8.8*   PT/INR No results for input(s): LABPROT, INR in the last 72 hours. ABG No results for input(s): PHART, HCO3 in the last 72 hours.  Invalid input(s): PCO2, PO2  Studies/Results: No results found.  Anti-infectives: Anti-infectives    Start     Dose/Rate Route Frequency Ordered Stop   08/07/15 1400  piperacillin-tazobactam (ZOSYN) IVPB 3.375 g     3.375 g 12.5 mL/hr over 240 Minutes Intravenous 3 times per day 08/07/15 0905     08/07/15 0600  ciprofloxacin (CIPRO) tablet 500 mg  Status:  Discontinued     500 mg Oral 2 times daily 08/07/15 0541 08/07/15 0842   08/06/15 0830  fluconazole (DIFLUCAN) IVPB 100 mg  Status:  Discontinued     100 mg 50 mL/hr over 60 Minutes Intravenous Every 24 hours 08/06/15 0819 08/06/15 0846   08/05/15 1600  anidulafungin (ERAXIS) 100 mg in sodium chloride 0.9 % 100 mL IVPB     100 mg over 90 Minutes Intravenous Every 24 hours 08/04/15 1509 08/07/15 2039   08/04/15 1600  anidulafungin (ERAXIS) 200 mg in sodium chloride 0.9 % 200 mL IVPB    Comments:  Pharmacy may adjust dosing  strength, schedule, rate of infusion, etc as needed to optimize therapy   200 mg over 180 Minutes Intravenous  Once 08/04/15 1509 08/04/15 1928   07/30/15 1645  piperacillin-tazobactam (ZOSYN) IVPB 3.375 g  Status:  Discontinued     3.375 g 12.5 mL/hr over 240 Minutes Intravenous 3 times per day 07/30/15 1634 08/07/15 0541   07/30/15 1545  piperacillin-tazobactam (ZOSYN) IVPB 3.375 g     3.375 g 100 mL/hr over 30 Minutes Intravenous  Once 07/30/15 1541 07/30/15 1757      Assessment/Plan: s/p * No surgery found * Continue zosyn day 16  Continue drains Would cut tpn in half and see if this helps her appetite Ambulate She would like pseudophed for sinuses  LOS: 17 days    TOTH III,Edgard Debord S 08/16/2015

## 2015-08-17 ENCOUNTER — Encounter: Payer: Self-pay | Admitting: Oncology

## 2015-08-17 LAB — CBC
HCT: 28.2 % — ABNORMAL LOW (ref 36.0–46.0)
HEMOGLOBIN: 8.9 g/dL — AB (ref 12.0–15.0)
MCH: 27.6 pg (ref 26.0–34.0)
MCHC: 31.6 g/dL (ref 30.0–36.0)
MCV: 87.6 fL (ref 78.0–100.0)
PLATELETS: 420 10*3/uL — AB (ref 150–400)
RBC: 3.22 MIL/uL — AB (ref 3.87–5.11)
RDW: 15.5 % (ref 11.5–15.5)
WBC: 9.5 10*3/uL (ref 4.0–10.5)

## 2015-08-17 LAB — MAGNESIUM: MAGNESIUM: 2 mg/dL (ref 1.7–2.4)

## 2015-08-17 LAB — COMPREHENSIVE METABOLIC PANEL
ALT: 25 U/L (ref 14–54)
ANION GAP: 10 (ref 5–15)
AST: 27 U/L (ref 15–41)
Albumin: 2.8 g/dL — ABNORMAL LOW (ref 3.5–5.0)
Alkaline Phosphatase: 184 U/L — ABNORMAL HIGH (ref 38–126)
BILIRUBIN TOTAL: 0.7 mg/dL (ref 0.3–1.2)
BUN: 16 mg/dL (ref 6–20)
CHLORIDE: 104 mmol/L (ref 101–111)
CO2: 25 mmol/L (ref 22–32)
Calcium: 9 mg/dL (ref 8.9–10.3)
Creatinine, Ser: 0.6 mg/dL (ref 0.44–1.00)
GFR calc Af Amer: >60 mL/min (ref >60)
Glucose, Bld: 107 mg/dL — ABNORMAL HIGH (ref 65–99)
POTASSIUM: 4.3 mmol/L (ref 3.5–5.1)
Sodium: 139 mmol/L (ref 135–145)
TOTAL PROTEIN: 6.9 g/dL (ref 6.5–8.1)

## 2015-08-17 LAB — DIFFERENTIAL
BASOS ABS: 0 10*3/uL (ref 0.0–0.1)
BASOS PCT: 0 %
EOS ABS: 0.4 10*3/uL (ref 0.0–0.7)
Eosinophils Relative: 4 %
Lymphocytes Relative: 8 %
Lymphs Abs: 0.8 10*3/uL (ref 0.7–4.0)
MONO ABS: 0.4 10*3/uL (ref 0.1–1.0)
MONOS PCT: 4 %
Neutro Abs: 8 10*3/uL — ABNORMAL HIGH (ref 1.7–7.7)
Neutrophils Relative %: 84 %

## 2015-08-17 LAB — GLUCOSE, CAPILLARY
GLUCOSE-CAPILLARY: 96 mg/dL (ref 65–99)
Glucose-Capillary: 97 mg/dL (ref 65–99)
Glucose-Capillary: 98 mg/dL (ref 65–99)

## 2015-08-17 LAB — TRIGLYCERIDES: TRIGLYCERIDES: 135 mg/dL (ref <150)

## 2015-08-17 LAB — PREALBUMIN: Prealbumin: 20.5 mg/dL (ref 18–38)

## 2015-08-17 LAB — PHOSPHORUS: PHOSPHORUS: 3.8 mg/dL (ref 2.5–4.6)

## 2015-08-17 MED ORDER — FAT EMULSION 20 % IV EMUL
120.0000 mL | INTRAVENOUS | Status: AC
  Administered 2015-08-17: 120 mL via INTRAVENOUS
  Filled 2015-08-17: qty 250

## 2015-08-17 MED ORDER — TRACE MINERALS CR-CU-MN-SE-ZN 10-1000-500-60 MCG/ML IV SOLN
INTRAVENOUS | Status: AC
  Administered 2015-08-17: 17:00:00 via INTRAVENOUS
  Filled 2015-08-17: qty 960

## 2015-08-17 NOTE — Progress Notes (Signed)
Subjective: Vomited earlier this AM after drinking Boost or Breeze.  No real change in the drainage or pain.  The drainage on the right is old bloody looking fluid, not very thick.  The drainage on the left is clear and serous.    Objective: Vital signs in last 24 hours: Temp:  [98.4 F (36.9 C)-99.9 F (37.7 C)] 99.9 F (37.7 C) (03/13 0500) Pulse Rate:  [81-84] 84 (03/13 0500) Resp:  [16] 16 (03/13 0500) BP: (111-150)/(51-62) 111/54 mmHg (03/13 0500) SpO2:  [95 %-98 %] 96 % (03/13 0500) Weight:  [97.478 kg (214 lb 14.4 oz)] 97.478 kg (214 lb 14.4 oz) (03/13 0500) Last BM Date: 08/16/15 Drains 145 80 from the right and 65 from the left Afebrile, VSS Labs OK Intake/Output from previous day: 03/12 0701 - 03/13 0700 In: 1666.7 [P.O.:240; IV Piggyback:150; TPN:1256.7] Out: 145 [Drains:145] Intake/Output this shift: Total I/O In: 240 [P.O.:240] Out: -   General appearance: alert, cooperative and no distress GI: soft, sore, but no real change from last week.  Lab Results:   Recent Labs  08/16/15 0623 08/17/15 0532  WBC 10.0 9.5  HGB 8.9* 8.9*  HCT 28.4* 28.2*  PLT 413* 420*    BMET  Recent Labs  08/16/15 0623 08/17/15 0532  NA 142 139  K 4.2 4.3  CL 105 104  CO2 27 25  GLUCOSE 103* 107*  BUN 19 16  CREATININE 0.53 0.60  CALCIUM 8.8* 9.0   PT/INR No results for input(s): LABPROT, INR in the last 72 hours.   Recent Labs Lab 08/13/15 0822 08/17/15 0532  AST 13* 27  ALT 11* 25  ALKPHOS 101 184*  BILITOT 1.0 0.7  PROT 7.1 6.9  ALBUMIN 2.7* 2.8*     Lipase     Component Value Date/Time   LIPASE 18 07/30/2015 1251     Studies/Results: No results found.  Medications: . acetaminophen  1,000 mg Oral TID  . antiseptic oral rinse  7 mL Mouth Rinse BID  . feeding supplement  1 Container Oral TID BM  . insulin aspart  0-15 Units Subcutaneous 4 times per day  . nystatin  5 mL Oral QID  . nystatin   Topical BID  . piperacillin-tazobactam (ZOSYN)   IV  3.375 g Intravenous 3 times per day  . saccharomyces boulardii  250 mg Oral BID  . sodium chloride flush  3 mL Intravenous Q12H   . Marland KitchenTPN (CLINIMIX-E) Adult     And  . fat emulsion    . Marland KitchenTPN (CLINIMIX-E) Adult 40 mL/hr at 08/16/15 1758   And  . fat emulsion 240 mL (08/16/15 1757)   Culture from 08/12/15 also shows E. Coli.  Sensitive to everything    Assessment/Plan Contained bowel perforation, vs tumor invasion of the bowel IR drain 07/31/15 - culture: E coli CT IMAGE GUIDED DRAINAGE BY PERCUTANEOUS CATHETER, 08/12/15, LEFT lower abdomen 220 ml drained; Dr. Sandi Mariscal Endometrial sarcoma,with bilateral pelvic node involvement/spinal & intraabdominal metastasis Hx of hysterectomy/nephrectomy [facility] 2016 -- chemo/radiation therapy/[facility] Erin Rossetti, MD  Hx of gastric sleeve  Anemia secondary to chronic blood loss Body mass index is 41.6 malnutrition on TNA Antibiotics: Day 18 Zosyn DVT: SCD's added     Plan:  I would leave thing as they are.  Continue antibiotics, we may want to consider looking at antibiotics.  She had her drain and last CT on  08/12/15, even though drainage on left looks significantly different from the right I don't think  either is ready to change or remove.  I told her to try liquids again and see how she does.  She has a new cough.  Her last CXR was 07/30/15.   LOS: 18 days    Erin Santiago 08/17/2015

## 2015-08-17 NOTE — Progress Notes (Signed)
Patient ID: Erin Santiago, female   DOB: 19-Jun-1955, 60 y.o.   MRN: [id number]    Referring Physician(s): Stark Klein  Supervising Physician: Daryll Brod  Chief Complaint:  abdominal abscesses  Subjective: Pt states "I have a cold"; also still with some abd discomfort, primarily in RLQ and intermittent nausea   Allergies: Review of patient's allergies indicates no known allergies.  Medications: Prior to Admission medications   Medication Sig Start Date End Date Taking? Authorizing Provider  calcium carbonate (TUMS - DOSED IN MG ELEMENTAL CALCIUM) 500 MG chewable tablet Chew 2 tablets by mouth daily.    Yes Historical Provider, MD  dexamethasone (DECADRON) 4 MG tablet Take 2 tablets by mouth once a day on the day after chemotherapy and then take 2 tablets two times a day for 2 days. Take with food. 07/27/15  Yes Lennis Marion Downer, MD  docusate sodium (COLACE) 100 MG capsule Take 100 mg by mouth 2 (two) times daily as needed for mild constipation. Reported on 05/28/2015 07/17/14  Yes Historical Provider, MD  hydrocortisone 2.5 % cream Apply 1 application topically as needed (itch/psoriasis.). Reported on 07/17/2015 05/19/14  Yes Historical Provider, MD  lidocaine-prilocaine (EMLA) cream Apply to Porta-cath 1-2 hrs prior to access. Cover with [employer]. 08/18/14  Yes Lennis Marion Downer, MD  LORazepam (ATIVAN) 1 MG tablet Place 1/2 to 1 tablet under the tongue or swallow every 6 hours as needed for nausea 07/22/15  Yes Lennis P Livesay, MD  meloxicam (MOBIC) 15 MG tablet Take 15 mg by mouth daily.  06/09/14  Yes Historical Provider, MD  ondansetron (ZOFRAN) 8 MG tablet Take 1 tablet (8 mg total) by mouth every 8 (eight) hours as needed for nausea or vomiting. 07/22/15  Yes Lennis Marion Downer, MD  prochlorperazine (COMPAZINE) 10 MG tablet Take 1 tablet (10 mg total) by mouth every 6 (six) hours as needed (Nausea or vomiting). 07/27/15  Yes Lennis Marion Downer, MD  Psyllium 28.3 % POWD Take 1 packet by  mouth daily as needed (for fiber). Reported on 05/26/2015   Yes Historical Provider, MD  Resveratrol 250 MG CAPS Take 250 mg by mouth 2 (two) times daily. Reported on 05/26/2015   Yes Historical Provider, MD  ferrous fumarate (HEMOCYTE) 325 (106 FE) MG TABS tablet Take 1 tab daily on an empty stomach with OJ or Vitamin C 500 mg Patient not taking: Reported on 07/09/2015 09/15/14   Lennis Marion Downer, MD  sucralfate (CARAFATE) 1 GM/10ML suspension Take 10 mLs (1 g total) by mouth 4 (four) times daily -  with meals and at bedtime. Patient not taking: Reported on 07/30/2015 05/20/15   [name], MD     Vital Signs: BP 111/54 mmHg  Pulse 84  Temp(Src) 99.9 F (37.7 C) (Oral)  Resp 16  Ht 5' (1.524 m)  Wt 214 lb 14.4 oz (97.478 kg)  BMI 41.97 kg/m2  SpO2 96%  LMP 02/04/2014  Physical Exam RLQ,LLQ drains intact, insertion sites ok; outputs small amount turbid, blood-tinged fluid; cx's- e coli; both drains irrigated with sterile NS  Imaging: No results found.  Labs:  CBC:  Recent Labs  08/13/15 0822 08/15/15 0612 08/16/15 0623 08/17/15 0532  WBC 9.5 8.8 10.0 9.5  HGB 9.7* 9.3* 8.9* 8.9*  HCT 30.7* 29.4* 28.4* 28.2*  PLT 465* 461* 413* 420*    COAGS:  Recent Labs  08/26/14 1300 07/30/15 2242 07/31/15 0428 08/12/15 0539  INR 0.95 1.49 1.41 1.35  APTT 27  --  32  --  BMP:  Recent Labs  08/12/15 0539 08/13/15 0822 08/16/15 0623 08/17/15 0532  NA 140 139 142 139  K 4.1 3.9 4.2 4.3  CL 103 104 105 104  CO2 '26 25 27 25  '$ GLUCOSE 129* 113* 103* 107*  BUN '17 18 19 16  '$ CALCIUM 8.9 8.8* 8.8* 9.0  CREATININE 0.54 0.67 0.53 0.60  GFRNONAA >60 >60 >60 >60  GFRAA >60 >60 >60 >60    LIVER FUNCTION TESTS:  Recent Labs  08/06/15 0513 08/10/15 0604 08/13/15 0822 08/17/15 0532  BILITOT 1.4* 1.3* 1.0 0.7  AST 11* 21 13* 27  ALT 10* 8* 11* 25  ALKPHOS 50 70 101 184*  PROT 6.1* 6.8 7.1 6.9  ALBUMIN 2.5* 2.8* 2.7* 2.8*    Assessment and Plan: Met endom ca;  s/p drainage of RLQ abd abscess 2/24 and left pelvic abscess 3/8; temp 99.9; WBC nl; hgb 8.9, creat ok; rec f/u CT next 24-48 hours to reassess adequacy of drainage   Electronically Signed: D. Rowe Robert 08/17/2015, 11:17 AM   I spent a total of 15 minutes at the the patient's bedside AND on the patient's hospital floor or unit, greater than 50% of which was counseling/coordinating care for abdominal abscess drains

## 2015-08-17 NOTE — Progress Notes (Signed)
Pt states she feels like she is getting a cold, temp is 99.9 this am.  Pt has congested non prod cough. Fara Olden P

## 2015-08-17 NOTE — Progress Notes (Signed)
PARENTERAL NUTRITION CONSULT NOTE -   Pharmacy Consult for TPN Indication: Perforated bowel; intolerant to enteral feeds  No Known Allergies  Patient Measurements: Height: 5' (152.4 cm) Weight: 214 lb 14.4 oz (97.478 kg) IBW/kg (Calculated) : 45.5 Adjusted Body Weight: 58.5 kg Usual Weight: ~98kg  Vital Signs: Temp: 99.9 F (37.7 C) (03/13 0500) Temp Source: Oral (03/13 0500) BP: 111/54 mmHg (03/13 0500) Pulse Rate: 84 (03/13 0500) Intake/Output from previous day: 03/12 0701 - 03/13 0700 In: 1666.7 [P.O.:240; IV Piggyback:150; TPN:1256.7] Out: 145 [Drains:145] Intake/Output from this shift:    Labs:  Recent Labs  08/15/15 0612 08/16/15 0623 08/17/15 0532  WBC 8.8 10.0 9.5  HGB 9.3* 8.9* 8.9*  HCT 29.4* 28.4* 28.2*  PLT 461* 413* 420*    Recent Labs  08/16/15 0623 08/17/15 0532  NA 142 139  K 4.2 4.3  CL 105 104  CO2 27 25  GLUCOSE 103* 107*  BUN 19 16  CREATININE 0.53 0.60  CALCIUM 8.8* 9.0  MG  --  2.0  PHOS  --  3.8  PROT  --  6.9  ALBUMIN  --  2.8*  AST  --  27  ALT  --  25  ALKPHOS  --  184*  BILITOT  --  0.7   Estimated Creatinine Clearance: 78.3 mL/min (by C-G formula based on Cr of 0.6).    Recent Labs  08/16/15 1222 08/16/15 1821 08/16/15 2337  GLUCAP 104* 105* 97   Medical History: Past Medical History  Diagnosis Date  . Abnormal uterine bleeding   . Anemia   . Fibroid   . Heart murmur     under 5 yrs of age  . Cancer ([place]) dx'd 07/2014    endometrial stromal sarcoma  . Radiation 12/01/14-01/05/15    pelvis 45 gray  . Radiation 05/18/2015-06/05/15    T4 thoracic spine area 35 gray   Assessment: 60 yo F admitted 2/23 with abdominal pain and noted to have perforated bowel.  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  She has been on Zosyn since admission for IAI and  Eraxis was added 2/28.   Clear liquid diet was attempted 2/25 however patient developed severe abdominal pain with nausea/vomiting.    Insulin Requirements during previous day: on moderate SSI q6h (0 units of insulin in the past 24 hrs) - no hx DM  Current Nutrition: liquid diet - boost/Resource TID started on 3/6  IVF: none  Central access: implanted port 11/19/14 TPN start date: 08/05/15  ASSESSMENT                                                                                                          HPI: 60 yo F admitted 2/23 with abdominal pain and noted to have perforated bowel.  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 She  has been on Zosyn since admission for IAI and Eraxis was added 2/28.   Clear liquid diet was attempted 2/25 however patient developed severe abdominal pain with nausea/vomiting.    Significant events:  3/1: IR consulted for drain adjustment (bulb changed) 3/3: TPN advanced to goal rate 3/5: abscess drainage decreased, 85 ml/24 hr, + BM's 3/6: patient reported BM this morning and eating about ~75% of meals (not documented in [facility]), Resource started 3/7 abd CT: no signif change in pelvic abscess, metastatic lymphadenopathy in the left lower pelvis. 3/8 drainage catheter in pelvis placed by IR 3/12 pt feels full, attributes to TPN. Decrease TPN  3/13 pt feels better re: fullness but having more abd pain, vomiting this am  Today:   Glucose (goal cbgs <150): all cbgs <150  Electrolytes: lytes wnl   Renal:  stable at patient's baseline  LFTs: Alk phos elevated, otherwise WNL.   TGs: 188 (3/2), 115 (3/6), 135 (3/13)  Prealbumin: 6.4 (3/2), 13.7 (3/6), 20.5 (3/13)  NUTRITIONAL GOALS                                                                                             RD recs (3/8): Kcal:  1950-2150 (20-22 kcal/kg) Protein: 120-140 grams of protein (1.25-1.45 grams/kg) Fluid: 2 L/day  Clinimix E 5/15 at a goal rate of 95 ml/hr + 20% fat emulsion at 10 ml/hr to provide: 114g/day protein, 2099 Kcal/day.  PLAN                                                                                                                         At 1800 today:  Continue reduced-rate Clinimix E 5/15 at 40 ml/hr per surgery  20% fat emulsion at 5 ml/hr.  Follow-up how tolerates diet and ability to stop TPN.    TPN to contain standard multivitamins and trace elements.  Continue moderate SSI q6h  TPN lab panels on Mondays & Thursdays.  F/u daily.  Doreene Eland, PharmD, BCPS.   Pager: [phone] 08/17/2015 7:14 AM

## 2015-08-17 NOTE — Progress Notes (Signed)
[facility] PROGRESS NOTE  Erin Santiago [id number] DOB: August 17, 1955 DOA: 07/30/2015 PCP: Merrilee Seashore, MD  Brief interval history  Erin Santiago is a 60 y.o. 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 Patient is status post chemotherapy and radiation to the pelvis and back and was due to start chemotherapy on the day of admission due to progression of disease involving the left abdominal pain. Presented to the ED with complaints of 1-2 day history of diffuse abdominal pain. CT abdomen and pelvis showed bowel perforation. Large 9 x 4.5 cm complex right lower quadrant and pelvic abscess containing a small amount of contrast suggesting communication with the distal ileum. general surgery consulted recommended initially nonoperative percutaneous drainage.She was placed on bowel rest and monitored. Interventional radiology was also consulted for drain placement. Patient had a percutaneous drain placed on 07/31/2015 and was placed empirically on IV Zosyn from admission. Wound/abscess cultures grew out Escherichia coli. Started on clears, Repeat CT abdomen and pelvis pending for 08/04/2015  Assessment/Plan: #1 Bowel perforation, complex intra-abdominal fluid collection 9 x 4.5 cm right lower quadrant and pelvic -likely due to tumor invasion -Per CT abdomen and pelvis appears that perforation may have walled off and contained.  -S/p RLQ percutaneous drain 07/31/2015 per IR.  Wound/abscess cultures growing Escherichia coli. She has been on IV Zosyn.  -Gen. surgery and interventional radiology following. -On 08/11/2015 repeat CT scan of abdomen and pelvis did not reveal significant change in large pelvic abscess per radiology, measuring 7 x 11.5 cm -3/8 underwent CT guided placed of a 10 Fr drainage catheter placement into the pelvis via the left  lower abdomen yielding 220 cc of purulent material.  -diet advanced to Full liquids, tolerating this, on TNA too -plan for repeat CT in next 1-2days  #2 history of iron deficiency anemia -Stable, monitor periodically  #3 dehydration/metabolic acidosis -Resolving. She was treated with IV fluids.   #4 hypokalemia -Potassium improved   #5 lactic acidosis - secondary to problem #1 and dehydration.  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 Patient status post chemotherapy and radiation to the pelvis. Patient was to start chemotherapy on the day of admission due to progression of disease involving the left, iliac adenopathy, 2 masses in the right lower quadrant seen on CT scan on 07/21/2015.   -She had a repeat CT scan on 08/11/2015 that revealed mild progression of abdominal pelvic lymphadenopathy consistent with progression of metastatic disease. -Radiology also revealed increase in mild hydronephrosis due to metastatic lymphadenopathy. Renal function remains stable.  -Dr. Marko Plume of medical oncology following, overall prognosis is poor  #7 URI/Sinus congestion pseudoephedrine PRN, symptom management  prophylaxis SCDs for DVT prophylaxis.  Code Status: Full Family Communication: Updated patient. No family at bedside. Disposition Plan: not ready for discharge   Consultants:  Gen. surgery: Dr. Excell Seltzer 07/30/2015   oncology: Dr. Marko Plume 07/31/2015  Interventional radiology  Procedures:  CT abdomen and pelvis 07/30/2015  Acute abdominal series 07/30/2015  Right lower quadrant abdominal abscess drain with CT 07/31/2015 for Dr. Annamaria Boots  Antibiotics:  IV Zosyn 07/30/2015  HPI/Subjective: Cough, sinus congestion  Objective: Filed Vitals:   08/16/15 2103 08/17/15 0500  BP: 135/62 111/54  Pulse: 81 84  Temp: 98.4 F (36.9 C) 99.9 F  (37.7 C)  Resp: 16 16  Intake/Output Summary (Last 24 hours) at 08/17/15 1312 Last data filed at 08/17/15 0947  Gross per 24 hour  Intake 1896.67 ml  Output     70 ml  Net 1826.67 ml   Filed Weights   08/07/15 0500 08/07/15 1536 08/17/15 0500  Weight: 97.07 kg (214 lb) 96.7 kg (213 lb 3 oz) 97.478 kg (214 lb 14.4 oz)    Exam:   General:  NAD, sitting at bedside chair, nontoxic  Cardiovascular: RRR  Respiratory: Normal respiratory effort, lungs are clear to auscultation bilaterally  Abdomen: Soft, nontender, nondistended. 2 drains noted with bloody output  Musculoskeletal: No clubbing cyanosis or edema.   Data Reviewed: Basic Metabolic Panel:  Recent Labs Lab 08/12/15 0539 08/13/15 0822 08/16/15 0623 08/17/15 0532  NA 140 139 142 139  K 4.1 3.9 4.2 4.3  CL 103 104 105 104  CO2 26 25 27 25   GLUCOSE 129* 113* 103* 107*  BUN 17 18 19 16   CREATININE 0.54 0.67 0.53 0.60  CALCIUM 8.9 8.8* 8.8* 9.0  MG  --  2.0  --  2.0  PHOS  --  3.3  --  3.8   Liver Function Tests:  Recent Labs Lab 08/13/15 0822 08/17/15 0532  AST 13* 27  ALT 11* 25  ALKPHOS 101 184*  BILITOT 1.0 0.7  PROT 7.1 6.9  ALBUMIN 2.7* 2.8*   No results for input(s): LIPASE, AMYLASE in the last 168 hours. No results for input(s): AMMONIA in the last 168 hours. CBC:  Recent Labs Lab 08/12/15 0539 08/13/15 0822 08/15/15 0612 08/16/15 0623 08/17/15 0532  WBC 9.2 9.5 8.8 10.0 9.5  NEUTROABS  --   --   --   --  8.0*  HGB 9.3* 9.7* 9.3* 8.9* 8.9*  HCT 29.6* 30.7* 29.4* 28.4* 28.2*  MCV 86.5 86.5 84.0 87.4 87.6  PLT 410* 465* 461* 413* 420*   Cardiac Enzymes: No results for input(s): CKTOTAL, CKMB, CKMBINDEX, TROPONINI in the last 168 hours. BNP (last 3 results) No results for input(s): BNP in the last 8760 hours.  ProBNP (last 3 results) No results for input(s): PROBNP in the last 8760 hours.  CBG:  Recent Labs Lab 08/16/15 0012 08/16/15 0648 08/16/15 1222 08/16/15 1821  08/16/15 2337  GLUCAP 116* 110* 104* 105* 97    Recent Results (from the past 240 hour(s))  Culture, routine-abscess     Status: None   Collection Time: 08/12/15  4:18 PM  Result Value Ref Range Status   Specimen Description DRAINAGE LEFT LOWER ABDOMEN  Final   Special Requests Normal  Final   Gram Stain   Final    ABUNDANT WBC PRESENT,BOTH PMN AND MONONUCLEAR NO SQUAMOUS EPITHELIAL CELLS SEEN ABUNDANT GRAM VARIABLE ROD Performed at [facility]    Culture   Final    FEW ESCHERICHIA COLI Performed at [facility]    Report Status 08/15/2015 FINAL  Final   Organism ID, Bacteria ESCHERICHIA COLI  Final      Susceptibility   Escherichia coli - MIC*    AMPICILLIN <=2 SENSITIVE Sensitive     AMPICILLIN/SULBACTAM <=2 SENSITIVE Sensitive     CEFEPIME <=1 SENSITIVE Sensitive     CEFTAZIDIME <=1 SENSITIVE Sensitive     CEFTRIAXONE <=1 SENSITIVE Sensitive     CIPROFLOXACIN <=0.25 SENSITIVE Sensitive     GENTAMICIN <=1 SENSITIVE Sensitive     IMIPENEM <=0.25 SENSITIVE Sensitive     PIP/TAZO <=4 SENSITIVE Sensitive     TOBRAMYCIN <=1 SENSITIVE Sensitive  TRIMETH/SULFA Value in next row Sensitive      <=20 SENSITIVE(NOTE)    * FEW ESCHERICHIA COLI     Studies: No results found.  Scheduled Meds: . acetaminophen  1,000 mg Oral TID  . antiseptic oral rinse  7 mL Mouth Rinse BID  . feeding supplement  1 Container Oral TID BM  . insulin aspart  0-15 Units Subcutaneous 4 times per day  . nystatin  5 mL Oral QID  . nystatin   Topical BID  . piperacillin-tazobactam (ZOSYN)  IV  3.375 g Intravenous 3 times per day  . saccharomyces boulardii  250 mg Oral BID  . sodium chloride flush  3 mL Intravenous Q12H   Continuous Infusions: . Marland KitchenTPN (CLINIMIX-E) Adult     And  . fat emulsion    . Marland KitchenTPN (CLINIMIX-E) Adult 40 mL/hr at 08/16/15 1758   And  . 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 If 7PM-7AM, please contact night-coverage at [email or web address], password [facility] 08/17/2015, 1:12 PM  LOS: 18 days

## 2015-08-17 NOTE — Progress Notes (Signed)
PT Cancellation Note  Patient Details Name: Erin Santiago MRN: [id number] DOB: Jan 16, 1956   Cancelled Treatment:    Reason Eval/Treat Not Completed: Medical issues which prohibited therapy (reports that she is having abd pain.  states that she  is ambulating w/ IV pole in room and hall. recommend  patient  try a 4 wheeled RW for home.)   [name] 08/17/2015, 2:19 PM Tresa Endo PT [phone]

## 2015-08-18 ENCOUNTER — Telehealth: Payer: Self-pay | Admitting: Oncology

## 2015-08-18 ENCOUNTER — Inpatient Hospital Stay (HOSPITAL_COMMUNITY): Payer: Federal, State, Local not specified - PPO

## 2015-08-18 ENCOUNTER — Encounter (HOSPITAL_COMMUNITY): Payer: Self-pay | Admitting: Radiology

## 2015-08-18 DIAGNOSIS — E860 Dehydration: Secondary | ICD-10-CM

## 2015-08-18 DIAGNOSIS — K651 Peritoneal abscess: Secondary | ICD-10-CM

## 2015-08-18 DIAGNOSIS — E440 Moderate protein-calorie malnutrition: Secondary | ICD-10-CM

## 2015-08-18 DIAGNOSIS — D500 Iron deficiency anemia secondary to blood loss (chronic): Secondary | ICD-10-CM

## 2015-08-18 DIAGNOSIS — D649 Anemia, unspecified: Secondary | ICD-10-CM

## 2015-08-18 DIAGNOSIS — N739 Female pelvic inflammatory disease, unspecified: Secondary | ICD-10-CM

## 2015-08-18 LAB — GLUCOSE, CAPILLARY
GLUCOSE-CAPILLARY: 95 mg/dL (ref 65–99)
GLUCOSE-CAPILLARY: 101 mg/dL — AB (ref 65–99)
Glucose-Capillary: 134 mg/dL — ABNORMAL HIGH (ref 65–99)
Glucose-Capillary: 95 mg/dL (ref 65–99)

## 2015-08-18 MED ORDER — ENSURE ENLIVE PO LIQD
237.0000 mL | Freq: Three times a day (TID) | ORAL | Status: DC
  Administered 2015-08-19 – 2015-08-21 (×3): 237 mL via ORAL

## 2015-08-18 MED ORDER — FAT EMULSION 20 % IV EMUL
120.0000 mL | INTRAVENOUS | Status: AC
  Administered 2015-08-18: 120 mL via INTRAVENOUS
  Filled 2015-08-18: qty 250

## 2015-08-18 MED ORDER — IOHEXOL 300 MG/ML  SOLN
100.0000 mL | Freq: Once | INTRAMUSCULAR | Status: AC | PRN
  Administered 2015-08-18: 100 mL via INTRAVENOUS

## 2015-08-18 MED ORDER — BOOST / RESOURCE BREEZE PO LIQD
1.0000 | Freq: Three times a day (TID) | ORAL | Status: DC
  Administered 2015-08-18 – 2015-08-21 (×8): 1 via ORAL

## 2015-08-18 MED ORDER — TRACE MINERALS CR-CU-MN-SE-ZN 10-1000-500-60 MCG/ML IV SOLN
INTRAVENOUS | Status: AC
  Administered 2015-08-18: 18:00:00 via INTRAVENOUS
  Filled 2015-08-18 (×24): qty 960

## 2015-08-18 NOTE — Progress Notes (Signed)
PT Cancellation Note  Patient Details Name: Gaige Bradstreet MRN: [id number] DOB: January 20, 1956   Cancelled Treatment:    Reason Eval/Treat Not Completed: Pain limiting ability to participate (c/o nagging discomfort in abdomen. wants to see MD first. PT  pager # on board. will return  for trial of 4 wheeled RW as patient  is able.)   [name] 08/18/2015, 8:30 AM Tresa Endo PT [phone]

## 2015-08-18 NOTE — Progress Notes (Signed)
80ml purulent,foul odored brown drainage emptied from R percutaneous drain. Pt tol well. Denies pain and nausea at this time

## 2015-08-18 NOTE — Progress Notes (Signed)
  Subjective: She looks fine, she is having some mid abdominal discomfort, but today she thinks it may be the mass. Drainage from the left is still clear and the right is old blood.    Objective: Vital signs in last 24 hours: Temp:  [97.4 F (36.3 C)-98.9 F (37.2 C)] 98.9 F (37.2 C) (03/14 0509) Pulse Rate:  [79-80] 79 (03/14 0509) Resp:  [16-17] 17 (03/14 0509) BP: (118-132)/(55-77) 132/57 mmHg (03/14 0509) SpO2:  [95 %-98 %] 95 % (03/14 0509) Last BM Date: 08/17/15 155 from right drain,  10 from left drain PO 240 recoreded Labs OK yesterday Repeat CT scan today   Intake/Output from previous day: 03/13 0701 - 03/14 0700 In: 2120.1 [P.O.:240; IV Piggyback:150; TPN:1720.1] Out: 165 [Drains:165] Intake/Output this shift:    General appearance: alert, cooperative and no distress GI: soft sore, tolerating diet, drains as noted above  Lab Results:   Recent Labs  08/16/15 0623 08/17/15 0532  WBC 10.0 9.5  HGB 8.9* 8.9*  HCT 28.4* 28.2*  PLT 413* 420*    BMET  Recent Labs  08/16/15 0623 08/17/15 0532  NA 142 139  K 4.2 4.3  CL 105 104  CO2 27 25  GLUCOSE 103* 107*  BUN 19 16  CREATININE 0.53 0.60  CALCIUM 8.8* 9.0   PT/INR No results for input(s): LABPROT, INR in the last 72 hours.   Recent Labs Lab 08/13/15 0822 08/17/15 0532  AST 13* 27  ALT 11* 25  ALKPHOS 101 184*  BILITOT 1.0 0.7  PROT 7.1 6.9  ALBUMIN 2.7* 2.8*     Lipase     Component Value Date/Time   LIPASE 18 07/30/2015 1251     Studies/Results: No results found.  Medications: . acetaminophen  1,000 mg Oral TID  . antiseptic oral rinse  7 mL Mouth Rinse BID  . feeding supplement  1 Container Oral TID BM  . insulin aspart  0-15 Units Subcutaneous 4 times per day  . nystatin  5 mL Oral QID  . nystatin   Topical BID  . piperacillin-tazobactam (ZOSYN)  IV  3.375 g Intravenous 3 times per day  . saccharomyces boulardii  250 mg Oral BID  . sodium chloride flush  3 mL  Intravenous Q12H    Assessment/Plan IR drain 07/31/15 - culture: E coli CT IMAGE GUIDED DRAINAGE BY PERCUTANEOUS CATHETER, 08/12/15, LEFT lower abdomen 220 ml drained; Dr. Sandi Mariscal Endometrial sarcoma,with bilateral pelvic node involvement/spinal & intraabdominal metastasis Hx of hysterectomy/nephrectomy [facility] 2016 -- chemo/radiation therapy/[facility] Vivien Rossetti, MD  Hx of gastric sleeve  Anemia secondary to chronic blood loss Body mass index is 41.6 malnutrition on TNA Antibiotics: Day 18 Zosyn DVT: SCD's added     Plan:  Repeat CT scan this AM.     LOS: 19 days    Erin Santiago 08/18/2015

## 2015-08-18 NOTE — Progress Notes (Signed)
Patient ID: Erin Santiago, female   DOB: 03/20/1956, 60 y.o.   MRN: [id number] Pt's left ant pelvic drain was removed in its entirety . No immediate complications. Gauze dressing applied to site. Above d/w CCS.

## 2015-08-18 NOTE — Progress Notes (Signed)
MEDICAL ONCOLOGY August 18, 2015, 10:45 AM  Hospital day 20 Antibiotics: zosyn Chemotherapy: planned adriamycin olaratumab on hold due to present complications   Outpatient Physicians:Emma Rossi/ Andrew Au,  [name], [name] [facility]),[name] (PCP [facility]), [name]; [name], [name]    EMR reviewed  Subjective: Pain right low abdomen/ pelvis just prior to bowels moving, stools still liquid. Ambulating to BR, knees stiff since off meloxicam x 3 weeks but otherwise tolerating this activity. Wants walker with seat when returns home. Continues clear liquids + TNA, does not feel hungry. No nausea or vomiting. Not SOB with this activity. Voiding ok. URI symptoms some better, with nasal congestion and clear drainage, no esophagitis symptoms, some cough productive of clear sputum. No problems with PAC or present peripheral IV. No drainage now from left JP, still significant drainage from RLQ.  Wishes she could go home but understands inpatient care still necessary.      ONCOLOGIC HISTORY  Patient had been menopausal since age 38 until she had what seemed to her to be a menstrual period in Sept 2015, with large blood clot at completion of bleeding stopped then. She continued with lesser bleeding over next several months until she was seen in 06-2014 by [name]. Hemoglobin was 12.7 on 07-04-14. CT CAP 06-25-14 had uterus 16.4 x 13.5 x 14.4 cm with marked expansion of endometrial canal by heterogeneous mass, bilateral pelvic sidewall adenopathy, iliac adenopathy with no definite retroperitoneal or mesenteric adenopathy, no ascites, no liver mets. Attempted endometrial sampling 06-26-14 and 07-03-14 was nondiagnostic; around that time the patient was passing clots and using one large maxipad hourly. She was seen by [name] 07-05-14, with uterine fundus palpable above umbilicus. She had surgery by [name] at  [facility] on 07-14-14, which was exploratory laparotomy with TAH BSO, bilateral total pelvic lymphadenectomy and sampling of aortic and renal nodes. 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 Case was presented at [facility] multidisciplinary conference 07-23-14, with recommendation for PET CT to evaluate inguinal and portahepatis nodes, and to consider adjuvant gemzar taxotere and possibly follow with 4 cycles of adriamycin, then to consider whole pelvic RT. She saw [name] for post op follow up on 07-28-14, with recommendation for gemzar taxotere and adriamycin, whole pelvic RT after chemo and consideration of Megace maintenance after chemo (possibly prior to negative ER PR information). PET 08-01-14 had some uptake in aortocaval and left paraaortic regions. She had day 1 cycle 1 gemzar taxotere on 08-28-14, day 8 cycle 1 on 09-04-14 and neulasta on 09-06-14. Admitted with neutropenic fever on day 14 cycle 1, with oral mucositis and some diarrhea. Counts maintained cycle 2 using OnPro neulasta day 9. She had progressive LE swelling after cycle 3, with venous dopplers negative 10-23-14, then LE swelling and SOB so marked after day 1 cycle 4 10-30-14 that chemo was held. Restaging CT AP + CXR 11-10-14 had stable tiny left pulmonary nodule/ no pleural effusion and normal heart size; CT had necrotic aortocaval adenopathy, iliac adenopathy L>R and 7x10 cm fluid collection in pelvis. She received IMRT 45 gray in 25 fractions to pelvis from 6-27 thru 01-05-15, with resolution of LE swelling by completion of course. CT CAP 02-03-15 showed improvement in retroperitoneal and pelvic involvement. She went onto observation after RT, plan to wait until clear progressive disease before resuming  treatment, possibly adriamycin vs on study. CT CAP 05-07-15 showed new lytic lesion at T4, stable 2-3 mm pulmonary nodules,  increased left pelvic and retroperitoneal nodes and RLQ peritoneal nodule. Radiation therapy summary as follows: Indication for treatment: A new bone lesion in the right side of the T4 vertebral body, with soft tissue extension slightly impinging upon the central spinal canal, some pain from this lesion Radiation treatment dates: 05/18/2015 through 06/05/2015 Site/dose: T4 thoracic spine area, 35 gray in 14 fractions She was not eligible for any of the arms of MATCH trial by evaluation in Jan 2017. Plan was to begin treatment with adriamycin + olaratumab, however this not begun prior to hospitalization 07-30-15 with abdominal/ pelvic abscess.     Objective: Vital signs in last 24 hours: Blood pressure 132/57, pulse 79, temperature 98.9 F (37.2 C), temperature source Oral, resp. rate 17, height 5' (1.524 m), weight 214 lb 14.4 oz (97.478 kg), last menstrual period 02/04/2014, SpO2 95 %. Awake, alert, sounds slightly nasally congested and raspy cough x 1 during visit, but respirations not labored RA. Sitting in recliner with legs elevated. Oral mucosa moist. Lungs without wheezes or rales anteriorly. PAC site ok with TNA infusing. Heart RRR. Abdomen obese, soft, not tight, some BS, not tender. Minimal erythema at insertion site of RLQ drain, that bag with dark brown fluid possibly slightly bloody in tubing. JP site ok, essentially empty. LE no pitting edema, cords, tenderness. No obvious effusion and no heat/ tenderness knees bilaterally.  Peripheral IV site ok. Moves all extremities easily.  Intake/Output from previous day: 03/13 0701 - 03/14 0700 In: 2120.1 [P.O.:240; IV Piggyback:150; TPN:1720.1] Out: 165 [Drains:165] Intake/Output this shift:      Lab Results:  Recent Labs  08/16/15 0623 08/17/15 0532  WBC 10.0 9.5  HGB 8.9* 8.9*  HCT 28.4* 28.2*  PLT 413* 420*   BMET  Recent Labs  08/16/15 0623 08/17/15 0532  NA 142 139  K 4.2 4.3  CL 105 104  CO2 27 25  GLUCOSE  103* 107*  BUN 19 16  CREATININE 0.53 0.60  CALCIUM 8.8* 9.0   Iron studies 08-14-15  Serum iron 39 and %sat 20  Studies/Results: Repeat CT planned today  Assessment/Plan: 1. LLQ/ pelvic abscess in setting of progressive endometrial stromal sarcoma:clinically improving with IR drains. Continuing antibiotics and TNA. Repeat CT pending today. 2.Endometrial stromal sarcoma: surgery [facility] 07-14-14, progression by start of cycle 4 adjuvant gemzar taxotere in 10-2014. Radiation to symptomatic aortocaval and bilateral iliac adenopathy. Further progression recently including T4, post radiation there. Not eligible for any of the arms of MATCH trial. Plan had been to start adriamycin + olaratumab just at time of this complication, held with these acute problems, expect to begin outpatient when stable. Long term prognosis from the metastatic endometrial sarcoma is not good, however disease has not been rapidly progressive and patient has been very functional.  3.nutritional status: on clear liquid diet + TNA. 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 Hx nonmelanoma skin cancer. 9.multifactorial anemia stable, iron low normal and would be best to resume po when able to take 10.viral URI on symptomatic treatment. 11.deconditioning secondary to above: lives alone, does have help arranged to care for animals   [place] Pager [phone]

## 2015-08-18 NOTE — Telephone Encounter (Signed)
Opened in error

## 2015-08-18 NOTE — Progress Notes (Signed)
[facility] PROGRESS NOTE  Erin Santiago [id number] DOB: 10/15/1955 DOA: 07/30/2015 PCP: Merrilee Seashore, MD  Brief interval history  Erin Santiago is a 60 y.o. female hx of endometrial stromal sarcoma with bilateral pelvic node involvement, diverticulosis, gastric sleeve at [facility], hysterectomy and nephrectomy at [facility] in 2016. Patient is status post chemotherapy and radiation to the pelvis and back and was due to start chemotherapy on the day of admission due to progression of disease involving the left abdominal pain. Presented to the ED with complaints of 1-2 day history of diffuse abdominal pain. CT abdomen and pelvis showed bowel perforation. Large 9 x 4.5 cm complex right lower quadrant and pelvic abscess containing a small amount of contrast suggesting communication with the distal ileum. general surgery consulted recommended initially nonoperative percutaneous drainage.She was placed on bowel rest and monitored. Interventional radiology was also consulted for drain placement. Patient had a percutaneous drain placed on 07/31/2015 and was placed empirically on IV Zosyn from admission. Wound/abscess cultures grew out Escherichia coli. Started on clears, Repeat CT abdomen and pelvis pending for 08/04/2015   HPI/Subjective: Patient sitting at bedside chair- states her nasal stuffiness is improving- has a sore area on her but - discussed measures to alleviate pressure- no nausea, abdominal pain or vomiting.   Assessment/Plan: #1 Bowel perforation, complex intra-abdominal fluid collection 9 x 4.5 cm right lower quadrant and pelvic -likely due to tumor invasion -Per CT abdomen and pelvis appears that perforation may have walled off and contained.  -S/p RLQ percutaneous drain 07/31/2015 per IR.  Wound/abscess cultures growing Escherichia coli. She has been on IV Zosyn.  -Gen. surgery and interventional radiology following. -On 08/11/2015 repeat CT scan of abdomen  and pelvis did not reveal significant change in large pelvic abscess per radiology, measuring 7 x 11.5 cm -3/8 underwent CT guided placed of a 10 Fr drainage catheter placement into the pelvis via the left lower abdomen yielding 220 cc of purulent material.  - onTNA  - management per surgical team  #2 history of iron deficiency anemia -Stable, monitor periodically  #3 dehydration/metabolic acidosis -Resolving. She was treated with IV fluids.   #4 hypokalemia -Potassium improved   #5 lactic acidosis Likely secondary to problem #1 and dehydration.   #6 history of endometrial stromal sarcoma with bilateral pelvic node involvement/metastatic cancer Patient is status post hysterectomy and nephrectomy at [facility] 2016. Patient status post chemotherapy and radiation to the pelvis. Patient was to start chemotherapy on the day of admission due to progression of disease involving the left, iliac adenopathy, 2 masses in the right lower quadrant seen on CT scan on 07/21/2015.   -She had a repeat CT scan on 08/11/2015 that revealed mild progression of abdominal pelvic lymphadenopathy consistent with progression of metastatic disease. -Radiology also revealed increase in mild hydronephrosis due to metastatic lymphadenopathy. Renal function remains stable.  -Dr. Marko Plume of medical oncology following, overall prognosis is poor  #7 Sinus congestion pseudoephedrine PRN- improving per patient  prophylaxis SCDs for DVT prophylaxis.  Code Status: Full Family Communication: Updated patient. No family at bedside. Disposition Plan: not ready for discharge- plan per surgery   Consultants:  Gen. surgery: Dr. Excell Seltzer 07/30/2015   oncology: Dr. Marko Plume 07/31/2015  Interventional radiology  Procedures:  CT abdomen and pelvis 07/30/2015  Acute abdominal series 07/30/2015  Right lower quadrant abdominal abscess drain with CT 07/31/2015 for Dr. Annamaria Boots  Antibiotics:  IV Zosyn  07/30/2015   Objective: Filed Vitals:   08/18/15 0509 08/18/15  1450  BP: 132/57 107/50  Pulse: 79 78  Temp: 98.9 F (37.2 C) 98.6 F (37 C)  Resp: 17 17    Intake/Output Summary (Last 24 hours) at 08/18/15 1610 Last data filed at 08/18/15 0535  Gross per 24 hour  Intake 1880.09 ml  Output     40 ml  Net 1840.09 ml   Filed Weights   08/07/15 0500 08/07/15 1536 08/17/15 0500  Weight: 97.07 kg (214 lb) 96.7 kg (213 lb 3 oz) 97.478 kg (214 lb 14.4 oz)    Exam:   General:  NAD, sitting at bedside chair, nontoxic  Cardiovascular: RRR  Respiratory: Normal respiratory effort, lungs are clear to auscultation bilaterally  Abdomen: Soft, nontender, nondistended. Drain in [employer]- with a large amount of brown liquid- drain in RLQ draining only a scant amount of blood  Musculoskeletal: No clubbing cyanosis or edema.   Data Reviewed: Basic Metabolic Panel:  Recent Labs Lab 08/12/15 0539 08/13/15 0822 08/16/15 0623 08/17/15 0532  NA 140 139 142 139  K 4.1 3.9 4.2 4.3  CL 103 104 105 104  CO2 26 25 27 25   GLUCOSE 129* 113* 103* 107*  BUN 17 18 19 16   CREATININE 0.54 0.67 0.53 0.60  CALCIUM 8.9 8.8* 8.8* 9.0  MG  --  2.0  --  2.0  PHOS  --  3.3  --  3.8   Liver Function Tests:  Recent Labs Lab 08/13/15 0822 08/17/15 0532  AST 13* 27  ALT 11* 25  ALKPHOS 101 184*  BILITOT 1.0 0.7  PROT 7.1 6.9  ALBUMIN 2.7* 2.8*   No results for input(s): LIPASE, AMYLASE in the last 168 hours. No results for input(s): AMMONIA in the last 168 hours. CBC:  Recent Labs Lab 08/12/15 0539 08/13/15 0822 08/15/15 0612 08/16/15 0623 08/17/15 0532  WBC 9.2 9.5 8.8 10.0 9.5  NEUTROABS  --   --   --   --  8.0*  HGB 9.3* 9.7* 9.3* 8.9* 8.9*  HCT 29.6* 30.7* 29.4* 28.4* 28.2*  MCV 86.5 86.5 84.0 87.4 87.6  PLT 410* 465* 461* 413* 420*   Cardiac Enzymes: No results for input(s): CKTOTAL, CKMB, CKMBINDEX, TROPONINI in the last 168 hours. BNP (last 3 results) No results for  input(s): BNP in the last 8760 hours.  ProBNP (last 3 results) No results for input(s): PROBNP in the last 8760 hours.  CBG:  Recent Labs Lab 08/16/15 2337 08/17/15 1848 08/17/15 2339 08/18/15 0505 08/18/15 1157  GLUCAP 97 98 96 95 101*    Recent Results (from the past 240 hour(s))  Culture, routine-abscess     Status: None   Collection Time: 08/12/15  4:18 PM  Result Value Ref Range Status   Specimen Description DRAINAGE LEFT LOWER ABDOMEN  Final   Special Requests Normal  Final   Gram Stain   Final    ABUNDANT WBC PRESENT,BOTH PMN AND MONONUCLEAR NO SQUAMOUS EPITHELIAL CELLS SEEN ABUNDANT GRAM VARIABLE ROD Performed at [facility]    Culture   Final    FEW ESCHERICHIA COLI Performed at [facility]    Report Status 08/15/2015 FINAL  Final   Organism ID, Bacteria ESCHERICHIA COLI  Final      Susceptibility   Escherichia coli - MIC*    AMPICILLIN <=2 SENSITIVE Sensitive     AMPICILLIN/SULBACTAM <=2 SENSITIVE Sensitive     CEFEPIME <=1 SENSITIVE Sensitive     CEFTAZIDIME <=1 SENSITIVE Sensitive     CEFTRIAXONE <=1 SENSITIVE Sensitive  CIPROFLOXACIN <=0.25 SENSITIVE Sensitive     GENTAMICIN <=1 SENSITIVE Sensitive     IMIPENEM <=0.25 SENSITIVE Sensitive     PIP/TAZO <=4 SENSITIVE Sensitive     TOBRAMYCIN <=1 SENSITIVE Sensitive     TRIMETH/SULFA Value in next row Sensitive      <=20 SENSITIVE(NOTE)    * FEW ESCHERICHIA COLI     Studies: Ct Abdomen Pelvis W Contrast  08/18/2015  CLINICAL DATA:  Re-evaluate intra-abdominal abscess EXAM: CT ABDOMEN AND PELVIS WITH CONTRAST TECHNIQUE: Multidetector CT imaging of the abdomen and pelvis was performed using the standard protocol following bolus administration of intravenous contrast. CONTRAST:  1109mL OMNIPAQUE IOHEXOL 300 MG/ML  SOLN COMPARISON:  08/12/2015, 08/11/2015 FINDINGS: Lower chest: Mild bilateral lung base atelectasis. No pleural effusions identified. There is mitral valve calcification.  Hepatobiliary: Negative Pancreas: Pancreas is normal Spleen: Spleen is normal Adrenals/Urinary Tract: The adrenal glands are normal. Right kidney is normal. Mild left hydroureter again identified due to pelvic adenopathy. Dilatation of the left renal pelvis and ureter mildly less prominent when compared to 08/11/2015. Stomach/Bowel: Status post sleeve gastrectomy. Nonobstructive bowel gas pattern. Distal large bowel diverticulosis again identified. Vascular/Lymphatic: No acute vascular abnormalities. Stable upper aortocaval 14 mm lymph node. Stable 43 x 34 mm partially necrotic lymph node mass in the aortocaval region at the level of the lower pole the kidneys. Stable 22 mm lymph node adjacent to the left common iliac artery. Stable 14 mm pelvic sidewall lymph node along the inner margin of the right acetabulum. Reproductive: Uterus is absent.  No pelvic masses. Other: Since 08/11/2015 a second pelvic drain has been placed. The previous right inferior anterior pelvic drain is again identified, and the air-fluid level containing structure associated with it has decreased in size significantly to about 3 cm in diameter. The left anterior pelvic drain, with tip extending obliquely into the right posterior pelvis, has been placed with significant reduction in the size of abscess to which it was targeted. This component of the abscess now measures only about 2 cm. In total the abscess previously measured 7 x 11 cm. The abscess is now difficult to measure as 1 abnormality as the anterior and posterior components described above no longer demonstrate obvious acute indication. Seen best on image number 68, just cephalad to the course of the initial right-sided pelvic drain, there is a persistent 4 cm abscess in the anterior right lower quadrant. This is unchanged. Musculoskeletal: There are no acute musculoskeletal findings IMPRESSION: Lobulated pelvic abscess previously identified is now markedly smaller, with unchanged  position of previous right-sided drain and significant reduction in size of fluid collection associated with it, and placement of second left-sided pelvic drain with market reduction in the component of the abscess as well. A third, approximately 4 cm abscess component in the right lower quadrant anteriorly is unchanged. Electronically Signed   By: Skipper Cliche M.D.   On: 08/18/2015 12:49    Scheduled Meds: . acetaminophen  1,000 mg Oral TID  . antiseptic oral rinse  7 mL Mouth Rinse BID  . feeding supplement  1 Container Oral TID BM  . feeding supplement (ENSURE ENLIVE)  237 mL Oral TID WC  . insulin aspart  0-15 Units Subcutaneous 4 times per day  . nystatin  5 mL Oral QID  . nystatin   Topical BID  . piperacillin-tazobactam (ZOSYN)  IV  3.375 g Intravenous 3 times per day  . saccharomyces boulardii  250 mg Oral BID  . sodium chloride flush  3 mL Intravenous Q12H   Continuous Infusions: . Marland KitchenTPN (CLINIMIX-E) Adult 40 mL/hr at 08/17/15 1726   And  . fat emulsion 120 mL (08/17/15 1726)  . Marland KitchenTPN (CLINIMIX-E) Adult     And  . fat emulsion      Time spent: 25 mins    Ruch, MD [facility] Pager [email or web address], password [facility] 08/18/2015, 4:10 PM  LOS: 19 days

## 2015-08-18 NOTE — Progress Notes (Signed)
Patient ID: Erin Santiago, female   DOB: 1955-11-19, 60 y.o.   MRN: [id number]    Referring Physician(s): Stark Klein  Supervising Physician: Jacqulynn Cadet  Chief Complaint: Intra-abdominal abscesses  Subjective: Pt still feels like she has a cold.  Has pain in her abdomen that she thinks is coming from her RLQ drain.  Still with intermittent nausea  Allergies: Review of patient's allergies indicates no known allergies.  Medications: Prior to Admission medications   Medication Sig Start Date End Date Taking? Authorizing Provider  calcium carbonate (TUMS - DOSED IN MG ELEMENTAL CALCIUM) 500 MG chewable tablet Chew 2 tablets by mouth daily.    Yes Historical Provider, MD  dexamethasone (DECADRON) 4 MG tablet Take 2 tablets by mouth once a day on the day after chemotherapy and then take 2 tablets two times a day for 2 days. Take with food. 07/27/15  Yes Lennis Marion Downer, MD  docusate sodium (COLACE) 100 MG capsule Take 100 mg by mouth 2 (two) times daily as needed for mild constipation. Reported on 05/28/2015 07/17/14  Yes Historical Provider, MD  hydrocortisone 2.5 % cream Apply 1 application topically as needed (itch/psoriasis.). Reported on 07/17/2015 05/19/14  Yes Historical Provider, MD  lidocaine-prilocaine (EMLA) cream Apply to Porta-cath 1-2 hrs prior to access. Cover with [employer]. 08/18/14  Yes Lennis Marion Downer, MD  LORazepam (ATIVAN) 1 MG tablet Place 1/2 to 1 tablet under the tongue or swallow every 6 hours as needed for nausea 07/22/15  Yes Lennis P Livesay, MD  meloxicam (MOBIC) 15 MG tablet Take 15 mg by mouth daily.  06/09/14  Yes Historical Provider, MD  ondansetron (ZOFRAN) 8 MG tablet Take 1 tablet (8 mg total) by mouth every 8 (eight) hours as needed for nausea or vomiting. 07/22/15  Yes Lennis Marion Downer, MD  prochlorperazine (COMPAZINE) 10 MG tablet Take 1 tablet (10 mg total) by mouth every 6 (six) hours as needed (Nausea or vomiting). 07/27/15  Yes Lennis Marion Downer, MD    Psyllium 28.3 % POWD Take 1 packet by mouth daily as needed (for fiber). Reported on 05/26/2015   Yes Historical Provider, MD  Resveratrol 250 MG CAPS Take 250 mg by mouth 2 (two) times daily. Reported on 05/26/2015   Yes Historical Provider, MD  ferrous fumarate (HEMOCYTE) 325 (106 FE) MG TABS tablet Take 1 tab daily on an empty stomach with OJ or Vitamin C 500 mg Patient not taking: Reported on 07/09/2015 09/15/14   Lennis Marion Downer, MD  sucralfate (CARAFATE) 1 GM/10ML suspension Take 10 mLs (1 g total) by mouth 4 (four) times daily -  with meals and at bedtime. Patient not taking: Reported on 07/30/2015 05/20/15   [name], MD    Vital Signs: BP 132/57 mmHg  Pulse 79  Temp(Src) 98.9 F (37.2 C) (Oral)  Resp 17  Ht 5' (1.524 m)  Wt 214 lb 14.4 oz (97.478 kg)  BMI 41.97 kg/m2  SpO2 95%  LMP 02/04/2014  Physical Exam: Abd: soft, minimally tender around drain sites, LLQ with 10cc/24hrs with minimal serosang output.  RLQ with 155cc/24hrs with now old bloody output.  Imaging: No results found.  Labs:  CBC:  Recent Labs  08/13/15 0822 08/15/15 0612 08/16/15 0623 08/17/15 0532  WBC 9.5 8.8 10.0 9.5  HGB 9.7* 9.3* 8.9* 8.9*  HCT 30.7* 29.4* 28.4* 28.2*  PLT 465* 461* 413* 420*    COAGS:  Recent Labs  08/26/14 1300 07/30/15 2242 07/31/15 0428 08/12/15 0539  INR 0.95 1.49  1.41 1.35  APTT 27  --  32  --     BMP:  Recent Labs  08/12/15 0539 08/13/15 0822 08/16/15 0623 08/17/15 0532  NA 140 139 142 139  K 4.1 3.9 4.2 4.3  CL 103 104 105 104  CO2 '26 25 27 25  '$ GLUCOSE 129* 113* 103* 107*  BUN '17 18 19 16  '$ CALCIUM 8.9 8.8* 8.8* 9.0  CREATININE 0.54 0.67 0.53 0.60  GFRNONAA >60 >60 >60 >60  GFRAA >60 >60 >60 >60    LIVER FUNCTION TESTS:  Recent Labs  08/06/15 0513 08/10/15 0604 08/13/15 0822 08/17/15 0532  BILITOT 1.4* 1.3* 1.0 0.7  AST 11* 21 13* 27  ALT 10* 8* 11* 25  ALKPHOS 50 70 101 184*  PROT 6.1* 6.8 7.1 6.9  ALBUMIN 2.5* 2.8* 2.7* 2.8*     Assessment and Plan: Met endom ca; s/p drainage of RLQ abd abscess 2/24 and 3/8 -patient's RLQ drain that used to be feculent output is now old bloody output, ? Mixed with some feculent output.   -LLQ drain with minimal output and clearing up -spoke to surgery who is going to repeat a CT scan today to evaluate these collections.  Electronically Signed: Henreitta Cea 08/18/2015, 9:57 AM   I spent a total of 15 Minutes at the the patient's bedside AND on the patient's hospital floor or unit, greater than 50% of which was counseling/coordinating care for intra-abdominal abscesses

## 2015-08-18 NOTE — Progress Notes (Signed)
Nutrition Follow-up  DOCUMENTATION CODES:   Not applicable  INTERVENTION:   - Continue TPN per pharmacy. - Diet advancement as medically feasible. - Starting 3/15, will provide patient with Ensure Enlive po TID for trial, each supplement provides 350 kcals and 20 grams of protein.  - Continue Boost Breeze TID at this time. - RD will continue to monitor for supplement acceptance and nutrition needs.  NUTRITION DIAGNOSIS:   Inadequate oral intake related to altered GI function as evidenced by per patient/family report.  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 Patient had presented to the ED with complaints of 1-2 day history of diffuse abdominal pain.   3/14 New weight on 3/13 of 214# (weight stable).  Nutrition needs remain appropriate at this time. Advanced to a FLD on 3/10 but has not tried many liquids aside from water and [employer] which she tolerates well.  Pt with emesis after consuming a Boost Breeze on 3/13.  Pt states that this only happened one time and that she has not had any other issues consuming Boost Breeze.  At time of visit, pt is awaiting a CT scan this afternoon.  She consumed 2 Boost Breezes this morning but said she was waiting to consume more liquids until after her CT.  Reports that she wants to order vanilla ice cream and some chocolate milk.  Pt amenable to trying Ensure Enlive starting tomorrow morning.  Denies nausea and abdominal pain.  Per surgery note on 3/12, cut TPN in half to see if it helps appetite.  Patient will continue receiving Clinimix E 5/15 at 40 ml/hr with 20% fat  emulsion at 5 ml/hr which is providing 922 kcals (47% of minimum kcal needs) and 48 grams of protein (40% of minimum protein needs) per pharmacy note. States that she still has no appetite but does feel much better after the TPN rate was decreased.    Patient is not fully meeting needs with current TPN and FLD regimen.  Will monitor for diet advancement and further nutrition-related needs.  Medications and labs reviewed: CBGs 95-98.  3/10 - Pt doing well with the Boost Breeze supplement, states she consumed 4 on 3/9. Prefers cold liquids over warm liquids. - States that she is no longer having abdominal pain or nausea since the placement of her second IP drain.   - Still having some diarrhea, and noticed some blood in the stool.  Encouraged to discuss with MD.  - Patient currently receiving Clinimix E 5/15 @ 83 ml/hr with 20% fat emulsion @ 10 ml/hr which is providing 100g/day protein (83% minimum estimated protein needs) and 1894 kcal (97% minimum estimated kcal needs). - No new weight recorded since 08/07/15. - Pt likely meeting minimum kcal and protein needs with TPN regimen and consumption of Boost Breeze supplements. .  3/8 - Diet downgraded to CLD on 3/6 based on symptoms with solid foods. - Pt was NPO at this time for second IR drain placement. - Pt reports constant diarrhea.  - Pt reports nausea with all intakes, include water. - She drank a 1/2 a Librarian, academic then had emesis ~  1 hour after intake. Left supplement order in place for when pt felt she was ready to tolerate the supplement.  - Pt currently receiving Clinimix E 5/15 @ 83 mL/hr with 20% lipids @ 10 mL/hr which is providing 100 grams of protein (83% minimum estimated protein needs) and 1894 kcal (97% minimum estimated kcal needs). - No new weight recorded since 08/07/15. - Pt meeting kcal needs and likely >/= 90% estimated protein needs from TPN regimen and minimal PO intakes.  *Please see chart for nutrition notes from  earlier in admission.  Diet Order:  Diet full liquid Room service appropriate?: Yes; Fluid consistency:: Thin TPN (CLINIMIX-E) Adult  Skin:  Wound (see comment) (R abdominal incision for IR drain)  Last BM:  3/9  Height:   Ht Readings from Last 1 Encounters:  08/07/15 5' (1.524 m)    Weight:   Wt Readings from Last 1 Encounters:  08/17/15 214 lb 14.4 oz (97.478 kg)    Ideal Body Weight:  45.45 kg  BMI:  Body mass index is 41.97 kg/(m^2).  Estimated Nutritional Needs:   Kcal:  1950-2150 (20-22 kcal/kg)  Protein:  120-140 grams of protein (1.25-1.45 grams/kg)  Fluid:  2 L/day  EDUCATION NEEDS:   No education needs identified at this time  Veronda Prude, Dietetic Intern Pager: [phone]

## 2015-08-18 NOTE — Progress Notes (Addendum)
PARENTERAL NUTRITION CONSULT NOTE -   Pharmacy Consult for TPN Indication: Perforated bowel; intolerant to enteral feeds  No Known Allergies  Patient Measurements: Height: 5' (152.4 cm) Weight: 214 lb 14.4 oz (97.478 kg) IBW/kg (Calculated) : 45.5 Adjusted Body Weight: 58.5 kg Usual Weight: ~98kg  Vital Signs: Temp: 98.9 F (37.2 C) (03/14 0509) Temp Source: Oral (03/14 0509) BP: 132/57 mmHg (03/14 0509) Pulse Rate: 79 (03/14 0509) Intake/Output from previous day: 03/13 0701 - 03/14 0700 In: 2120.1 [P.O.:240; IV Piggyback:150; TPN:1720.1] Out: 165 [Drains:165] Intake/Output from this shift:    Labs:  Recent Labs  08/16/15 0623 08/17/15 0532  WBC 10.0 9.5  HGB 8.9* 8.9*  HCT 28.4* 28.2*  PLT 413* 420*    Recent Labs  08/16/15 0623 08/17/15 0532  NA 142 139  K 4.2 4.3  CL 105 104  CO2 27 25  GLUCOSE 103* 107*  BUN 19 16  CREATININE 0.53 0.60  CALCIUM 8.8* 9.0  MG  --  2.0  PHOS  --  3.8  PROT  --  6.9  ALBUMIN  --  2.8*  AST  --  27  ALT  --  25  ALKPHOS  --  184*  BILITOT  --  0.7  PREALBUMIN  --  20.5  TRIG  --  135   Estimated Creatinine Clearance: 78.3 mL/min (by C-G formula based on Cr of 0.6).    Recent Labs  08/17/15 1848 08/17/15 2339 08/18/15 0505  GLUCAP 98 96 95   Medical History: Past Medical History  Diagnosis Date  . Abnormal uterine bleeding   . Anemia   . Fibroid   . Heart murmur     under 5 yrs of age  . Cancer (HCC) dx'd 07/2014    endometrial stromal sarcoma  . Radiation 12/01/14-01/05/15    pelvis 45 gray  . Radiation 05/18/2015-06/05/15    T4 thoracic spine area 35 gray   Assessment: 60 yo F admitted 2/23 with abdominal pain and noted to have perforated bowel.  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  She has been on Zosyn since admission for IAI and  Eraxis was added 2/28.   Clear liquid diet was attempted 2/25 however patient developed severe abdominal pain with nausea/vomiting.    Insulin Requirements during previous day: on moderate SSI q6h (0 units of insulin in the past 24 hrs) - no hx DM  Current Nutrition: liquid diet - boost/Resource TID started on 3/6  IVF: none  Central access: implanted port 11/19/14 TPN start date: 08/05/15  ASSESSMENT                                                                                                          HPI: 60 yo F admitted 2/23 with abdominal pain and noted to have perforated bowel.  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 She has been on Zosyn since admission for IAI and Eraxis was added 2/28.   Clear liquid diet was attempted 2/25 however patient developed severe abdominal pain with nausea/vomiting.    Significant events:  3/1: IR consulted for drain adjustment (bulb changed) 3/3: TPN advanced to goal rate 3/5: abscess drainage decreased, 85 ml/24 hr, + BM's 3/6: patient reported BM this morning and eating about ~75% of meals (not documented in [facility]), Resource started 3/7 abd CT: no signif change in pelvic abscess, metastatic lymphadenopathy in the left lower pelvis. 3/8 drainage catheter in pelvis placed by IR 3/12 pt feels full, attributes to TPN. Decrease TPN  3/13 pt feels better re: fullness but having more abd pain, vomited x 1 this am 3/14 repeat CT today  Today:   Glucose (goal cbgs <150): all cbgs <150  Electrolytes: lytes wnl 3/13  Renal:  stable at patient's baseline  LFTs: Alk phos elevated, otherwise WNL.   TGs: 188 (3/2), 115 (3/6), 135 (3/13)  Prealbumin: 6.4 (3/2), 13.7 (3/6), 20.5 (3/13)  NUTRITIONAL GOALS                                                                                              RD recs (3/8): Kcal: 1950-2150 (20-22 kcal/kg) Protein: 120-140 grams of protein (1.25-1.45 grams/kg) Fluid: 2 L/day  Clinimix E 5/15 at a goal rate of 95 ml/hr + 20% fat emulsion at 10 ml/hr to provide: 114g/day protein, 2099 Kcal/day.  PLAN                                                                                                                         At 1800 today:  Continue reduced-rate Clinimix E 5/15 at 40 ml/hr per surgery  Prealbumin much improved per labs 3/13 - repleted protein stores  20% fat emulsion at 5 ml/hr.  Follow-up how tolerates diet and ability to stop TPN. (or increase rate if not tolerating PO)  TPN to contain standard multivitamins and trace elements.  Continue moderate SSI q6h  TPN lab panels on Mondays & Thursdays.  F/u daily.  Doreene Eland, PharmD, BCPS.   Pager: [phone] 08/18/2015 7:25 AM

## 2015-08-19 ENCOUNTER — Encounter: Payer: Self-pay | Admitting: Oncology

## 2015-08-19 LAB — GLUCOSE, CAPILLARY
GLUCOSE-CAPILLARY: 121 mg/dL — AB (ref 65–99)
GLUCOSE-CAPILLARY: 100 mg/dL — AB (ref 65–99)
GLUCOSE-CAPILLARY: 51 mg/dL — AB (ref 65–99)
Glucose-Capillary: 93 mg/dL (ref 65–99)

## 2015-08-19 MED ORDER — ALTEPLASE 2 MG IJ SOLR
2.0000 mg | Freq: Once | INTRAMUSCULAR | Status: AC
  Administered 2015-08-19: 2 mg
  Filled 2015-08-19: qty 2

## 2015-08-19 MED ORDER — FAT EMULSION 20 % IV EMUL
120.0000 mL | INTRAVENOUS | Status: DC
  Administered 2015-08-19: 120 mL via INTRAVENOUS
  Filled 2015-08-19: qty 250

## 2015-08-19 MED ORDER — TRACE MINERALS CR-CU-MN-SE-ZN 10-1000-500-60 MCG/ML IV SOLN
INTRAVENOUS | Status: DC
  Administered 2015-08-19: 18:00:00 via INTRAVENOUS
  Filled 2015-08-19: qty 960

## 2015-08-19 NOTE — Progress Notes (Signed)
PARENTERAL NUTRITION CONSULT NOTE -   Pharmacy Consult for TPN Indication: Perforated bowel; intolerant to enteral feeds  No Known Allergies  Patient Measurements: Height: 5' (152.4 cm) Weight: 214 lb 14.4 oz (97.478 kg) IBW/kg (Calculated) : 45.5 Adjusted Body Weight: 58.5 kg Usual Weight: ~98kg  Vital Signs: Temp: 98.7 F (37.1 C) (03/15 0545) Temp Source: Oral (03/15 0545) BP: 114/60 mmHg (03/15 0545) Pulse Rate: 86 (03/15 0545) Intake/Output from previous day: 03/14 0701 - 03/15 0700 In: -  Out: 90 [Drains:90] Intake/Output from this shift: Total I/O In: 5 [Other:5] Out: -   Labs:  Recent Labs  08/17/15 0532  WBC 9.5  HGB 8.9*  HCT 28.2*  PLT 420*    Recent Labs  08/17/15 0532  NA 139  K 4.3  CL 104  CO2 25  GLUCOSE 107*  BUN 16  CREATININE 0.60  CALCIUM 9.0  MG 2.0  PHOS 3.8  PROT 6.9  ALBUMIN 2.8*  AST 27  ALT 25  ALKPHOS 184*  BILITOT 0.7  PREALBUMIN 20.5  TRIG 135   Estimated Creatinine Clearance: 78.3 mL/min (by C-G formula based on Cr of 0.6).    Recent Labs  08/18/15 1724 08/18/15 2342 08/19/15 0549  GLUCAP 134* 95 121*   Medical History: Past Medical History  Diagnosis Date  . Abnormal uterine bleeding   . Anemia   . Fibroid   . Heart murmur     under 5 yrs of age  . Cancer ([place]) dx'd 07/2014    endometrial stromal sarcoma  . Radiation 12/01/14-01/05/15    pelvis 45 gray  . Radiation 05/18/2015-06/05/15    T4 thoracic spine area 35 gray   Assessment: 60 yo F admitted 2/23 with abdominal pain and noted to have perforated bowel.  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  She has been on Zosyn since admission for IAI and Eraxis was added 2/28.   Clear liquid diet was attempted 2/25 however patient developed severe abdominal pain with nausea/vomiting.    Insulin  Requirements during previous day: on moderate SSI q6h (2 units of insulin in the past 24 hrs) - no hx DM  Current Nutrition: advanced to soft diet 3/15 - boost/Resource TID started on 3/6, pt reports finishing entire contents of doses charted  IVF: none  Central access: implanted port 11/19/14 TPN start date: 08/05/15  ASSESSMENT                                                                                                          HPI: 60 yo F admitted 2/23 with abdominal pain and noted to have perforated bowel.  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 She has been on Zosyn since admission for IAI and Eraxis was added 2/28.   Clear liquid diet was attempted 2/25 however patient developed severe abdominal  pain with nausea/vomiting.    Significant events:  3/1: IR consulted for drain adjustment (bulb changed) 3/3: TPN advanced to goal rate 3/5: abscess drainage decreased, 85 ml/24 hr, + BM's 3/6: patient reported BM this morning and eating about ~75% of meals (not documented in [facility]), Resource started 3/7 abd CT: no signif change in pelvic abscess, metastatic lymphadenopathy in the left lower pelvis. 3/8 drainage catheter in pelvis placed by IR 3/12 pt feels full, attributes to TPN. Decrease TPN  3/13 pt feels better re: fullness but having more abd pain, vomited x 1 this am 3/14 repeat CT today  Today:   Glucose (goal cbgs <150): all cbgs <150  Electrolytes: lytes wnl 3/13  Renal:  stable at patient's baseline  LFTs: Alk phos elevated, otherwise WNL.   TGs: 188 (3/2), 115 (3/6), 135 (3/13)  Prealbumin: 6.4 (3/2), 13.7 (3/6), 20.5 (3/13)  NUTRITIONAL GOALS                                                                                             RD recs (3/8): Kcal: 1950-2150 (20-22 kcal/kg) Protein: 120-140 grams  of protein (1.25-1.45 grams/kg) Fluid: 2 L/day  Clinimix E 5/15 at a goal rate of 95 ml/hr + 20% fat emulsion at 10 ml/hr to provide: 114g/day protein, 2099 Kcal/day.  PLAN                                                                                                                         At 1800 today:  Continue reduced-rate Clinimix E 5/15 at 40 ml/hr per surgery. CCS advanced diet to soft diet today and ordered calorie count by RD.  Prealbumin much improved per labs 3/13 - repleted protein stores  20% fat emulsion at 5 ml/hr.  Follow-up how tolerates diet and ability to stop TPN. (or increase rate if not tolerating PO)  TPN to contain standard multivitamins and trace elements.  Continue moderate SSI q6h  TPN lab panels on Mondays & Thursdays.  F/u daily.  Hershal Coria, PharmD, BCPS Pager: [phone] 08/19/2015 9:55 AM

## 2015-08-19 NOTE — Progress Notes (Signed)
[facility] PROGRESS NOTE  Erin Santiago [id number] DOB: [birth year] DOA: 07/30/2015 PCP: Merrilee Seashore, MD  Brief interval history  Erin Santiago is a 60 y.o. 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 Patient is status post chemotherapy and radiation to the pelvis and back and was due to start chemotherapy on the day of admission due to progression of disease involving the left abdominal pain. Presented to the ED with complaints of 1-2 day history of diffuse abdominal pain. CT abdomen and pelvis showed bowel perforation. Large 9 x 4.5 cm complex right lower quadrant and pelvic abscess containing a small amount of contrast suggesting communication with the distal ileum. general surgery consulted recommended initially nonoperative percutaneous drainage.She was placed on bowel rest and monitored. Interventional radiology was also consulted for drain placement. Patient had a percutaneous drain placed on 07/31/2015 and was placed empirically on IV Zosyn from admission. Wound/abscess cultures grew out Escherichia coli. Started on clears, Repeat CT abdomen and pelvis pending for 08/04/2015   HPI/Subjective: Patient sitting at bedside chair- states her nasal stuffiness is improving- has a sore area on her but - discussed measures to alleviate pressure- no nausea, abdominal pain or vomiting.   Assessment/Plan: #1 Bowel perforation, complex intra-abdominal fluid collection 9 x 4.5 cm right lower quadrant and pelvic -likely due to tumor invasion -Per CT abdomen and pelvis appears that perforation may have walled off and contained.  -S/p RLQ percutaneous drain 07/31/2015 per IR.  Wound/abscess cultures growing Escherichia coli. She has been on IV Zosyn.  -Gen. surgery and interventional radiology following. -On 08/11/2015 repeat CT scan of abdomen  and pelvis did not reveal significant change in large pelvic abscess per radiology, measuring 7 x 11.5 cm -3/8 underwent CT guided placed of a 10 Fr drainage catheter placement into the pelvis via the left lower abdomen yielding 220 cc of purulent material.  - onTNA  -Case was discussed with surgery who recommended monitoring patient off of antibiotics, and advancing her diet to see how she does. Plan to discharge her home this weekend with drain in place and have her follow-up with general surgery and the drain clinic.  #2 history of iron deficiency anemia -Stable, monitor periodically  #3 dehydration/metabolic acidosis -Resolving. She was treated with IV fluids.   #4 hypokalemia -Potassium improved   #5 lactic acidosis Likely secondary to problem #1 and dehydration.   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 Patient status post chemotherapy and radiation to the pelvis. Patient was to start chemotherapy on the day of admission due to progression of disease involving the left, iliac adenopathy, 2 masses in the right lower quadrant seen on CT scan on 07/21/2015.   -She had a repeat CT scan on 08/11/2015 that revealed mild progression of abdominal pelvic lymphadenopathy consistent with progression of metastatic disease. -Radiology also revealed increase in mild hydronephrosis due to metastatic lymphadenopathy. Renal function remains stable.  -Dr. Marko Plume of medical oncology following, overall prognosis is poor  #7 Sinus congestion pseudoephedrine PRN- improving per patient  prophylaxis SCDs for DVT prophylaxis.  Code Status: Full Family Communication: Updated patient. No family at bedside. Disposition Plan: Possible discharge this weekend if she continues to improve the remaining stable off of IV antibiotic therapy   Consultants:  Gen. surgery: Dr.  Excell Seltzer 07/30/2015   oncology:  Dr. Marko Plume 07/31/2015  Interventional radiology  Procedures:  CT abdomen and pelvis 07/30/2015  Acute abdominal series 07/30/2015  Right lower quadrant abdominal abscess drain with CT 07/31/2015 for Dr. Annamaria Boots  Antibiotics:  IV Zosyn 07/30/2015   Objective: Filed Vitals:   08/18/15 2057 08/19/15 0545  BP: 118/59 114/60  Pulse: 78 86  Temp: 99 F (37.2 C) 98.7 F (37.1 C)  Resp: 16 20    Intake/Output Summary (Last 24 hours) at 08/19/15 1244 Last data filed at 08/19/15 0800  Gross per 24 hour  Intake      5 ml  Output     90 ml  Net    -85 ml   Filed Weights   08/07/15 0500 08/07/15 1536 08/17/15 0500  Weight: 97.07 kg (214 lb) 96.7 kg (213 lb 3 oz) 97.478 kg (214 lb 14.4 oz)    Exam:   General:  NAD, sitting at bedside chair, nontoxic  Cardiovascular: RRR  Respiratory: Normal respiratory effort, lungs are clear to auscultation bilaterally  Abdomen: Soft, nontender, nondistended. Drain in [employer]- with a large amount of brown liquid- drain in RLQ draining only a scant amount of blood  Musculoskeletal: No clubbing cyanosis or edema.   Data Reviewed: Basic Metabolic Panel:  Recent Labs Lab 08/13/15 0822 08/16/15 0623 08/17/15 0532  NA 139 142 139  K 3.9 4.2 4.3  CL 104 105 104  CO2 25 27 25   GLUCOSE 113* 103* 107*  BUN 18 19 16   CREATININE 0.67 0.53 0.60  CALCIUM 8.8* 8.8* 9.0  MG 2.0  --  2.0  PHOS 3.3  --  3.8   Liver Function Tests:  Recent Labs Lab 08/13/15 0822 08/17/15 0532  AST 13* 27  ALT 11* 25  ALKPHOS 101 184*  BILITOT 1.0 0.7  PROT 7.1 6.9  ALBUMIN 2.7* 2.8*   No results for input(s): LIPASE, AMYLASE in the last 168 hours. No results for input(s): AMMONIA in the last 168 hours. CBC:  Recent Labs Lab 08/13/15 0822 08/15/15 0612 08/16/15 0623 08/17/15 0532  WBC 9.5 8.8 10.0 9.5  NEUTROABS  --   --   --  8.0*  HGB 9.7* 9.3* 8.9* 8.9*  HCT 30.7* 29.4* 28.4* 28.2*  MCV 86.5 84.0 87.4  87.6  PLT 465* 461* 413* 420*   Cardiac Enzymes: No results for input(s): CKTOTAL, CKMB, CKMBINDEX, TROPONINI in the last 168 hours. BNP (last 3 results) No results for input(s): BNP in the last 8760 hours.  ProBNP (last 3 results) No results for input(s): PROBNP in the last 8760 hours.  CBG:  Recent Labs Lab 08/18/15 1157 08/18/15 1724 08/18/15 2342 08/19/15 0549 08/19/15 1155  GLUCAP 101* 134* 95 121* 100*    Recent Results (from the past 240 hour(s))  Culture, routine-abscess     Status: None   Collection Time: 08/12/15  4:18 PM  Result Value Ref Range Status   Specimen Description DRAINAGE LEFT LOWER ABDOMEN  Final   Special Requests Normal  Final   Gram Stain   Final    ABUNDANT WBC PRESENT,BOTH PMN AND MONONUCLEAR NO SQUAMOUS EPITHELIAL CELLS SEEN ABUNDANT GRAM VARIABLE ROD Performed at [facility]    Culture   Final    FEW ESCHERICHIA COLI Performed at [facility]    Report Status 08/15/2015 FINAL  Final   Organism ID, Bacteria ESCHERICHIA COLI  Final      Susceptibility   Escherichia coli - MIC*    AMPICILLIN <=2 SENSITIVE Sensitive  AMPICILLIN/SULBACTAM <=2 SENSITIVE Sensitive     CEFEPIME <=1 SENSITIVE Sensitive     CEFTAZIDIME <=1 SENSITIVE Sensitive     CEFTRIAXONE <=1 SENSITIVE Sensitive     CIPROFLOXACIN <=0.25 SENSITIVE Sensitive     GENTAMICIN <=1 SENSITIVE Sensitive     IMIPENEM <=0.25 SENSITIVE Sensitive     PIP/TAZO <=4 SENSITIVE Sensitive     TOBRAMYCIN <=1 SENSITIVE Sensitive     TRIMETH/SULFA Value in next row Sensitive      <=20 SENSITIVE(NOTE)    * FEW ESCHERICHIA COLI     Studies: Ct Abdomen Pelvis W Contrast  08/18/2015  CLINICAL DATA:  Re-evaluate intra-abdominal abscess EXAM: CT ABDOMEN AND PELVIS WITH CONTRAST TECHNIQUE: Multidetector CT imaging of the abdomen and pelvis was performed using the standard protocol following bolus administration of intravenous contrast. CONTRAST:  144mL OMNIPAQUE IOHEXOL 300  MG/ML  SOLN COMPARISON:  08/12/2015, 08/11/2015 FINDINGS: Lower chest: Mild bilateral lung base atelectasis. No pleural effusions identified. There is mitral valve calcification. Hepatobiliary: Negative Pancreas: Pancreas is normal Spleen: Spleen is normal Adrenals/Urinary Tract: The adrenal glands are normal. Right kidney is normal. Mild left hydroureter again identified due to pelvic adenopathy. Dilatation of the left renal pelvis and ureter mildly less prominent when compared to 08/11/2015. Stomach/Bowel: Status post sleeve gastrectomy. Nonobstructive bowel gas pattern. Distal large bowel diverticulosis again identified. Vascular/Lymphatic: No acute vascular abnormalities. Stable upper aortocaval 14 mm lymph node. Stable 43 x 34 mm partially necrotic lymph node mass in the aortocaval region at the level of the lower pole the kidneys. Stable 22 mm lymph node adjacent to the left common iliac artery. Stable 14 mm pelvic sidewall lymph node along the inner margin of the right acetabulum. Reproductive: Uterus is absent.  No pelvic masses. Other: Since 08/11/2015 a second pelvic drain has been placed. The previous right inferior anterior pelvic drain is again identified, and the air-fluid level containing structure associated with it has decreased in size significantly to about 3 cm in diameter. The left anterior pelvic drain, with tip extending obliquely into the right posterior pelvis, has been placed with significant reduction in the size of abscess to which it was targeted. This component of the abscess now measures only about 2 cm. In total the abscess previously measured 7 x 11 cm. The abscess is now difficult to measure as 1 abnormality as the anterior and posterior components described above no longer demonstrate obvious acute indication. Seen best on image number 68, just cephalad to the course of the initial right-sided pelvic drain, there is a persistent 4 cm abscess in the anterior right lower quadrant.  This is unchanged. Musculoskeletal: There are no acute musculoskeletal findings IMPRESSION: Lobulated pelvic abscess previously identified is now markedly smaller, with unchanged position of previous right-sided drain and significant reduction in size of fluid collection associated with it, and placement of second left-sided pelvic drain with market reduction in the component of the abscess as well. A third, approximately 4 cm abscess component in the right lower quadrant anteriorly is unchanged. Electronically Signed   By: Skipper Cliche M.D.   On: 08/18/2015 12:49    Scheduled Meds: . acetaminophen  1,000 mg Oral TID  . antiseptic oral rinse  7 mL Mouth Rinse BID  . feeding supplement  1 Container Oral TID BM  . feeding supplement (ENSURE ENLIVE)  237 mL Oral TID WC  . insulin aspart  0-15 Units Subcutaneous 4 times per day  . nystatin  5 mL Oral QID  . nystatin  Topical BID  . saccharomyces boulardii  250 mg Oral BID  . sodium chloride flush  3 mL Intravenous Q12H   Continuous Infusions: . Marland KitchenTPN (CLINIMIX-E) Adult 40 mL/hr at 08/18/15 1805   And  . fat emulsion 120 mL (08/18/15 1805)  . Marland KitchenTPN (CLINIMIX-E) Adult     And  . fat emulsion      Time spent: 15 mins    Kelvin Cellar, MD [facility] Pager [email or web address], password [facility] 08/19/2015, 12:44 PM  LOS: 20 days

## 2015-08-19 NOTE — Progress Notes (Signed)
Physical Therapy Treatment Patient Details Name: Harmonei Calzado MRN: [id number] DOB: 1956-06-03 Today's Date: 08/19/2015    History of Present Illness 60 yo female admitted 2/23 with abdominal pain, h/o endometrial stromal sarcome, underwent surgery in February. S/P placement  of drain for abcess.    PT Comments    Patient likes a 4 wheeled RW. [facility] on carries small diameter wheels. Patient desires a larger wheel for outside terrain. Patient reports that she will check online and order. Will check with patient for a RW  For in house in the interim.   Follow Up Recommendations  Home health PT;Supervision - Intermittent     Equipment Recommendations   (patient wants to  get a  4 wheeled RW with bigger  wheels  to go outside. Patient states that whe will check online and order.)    Recommendations for Other Services       Precautions / Restrictions Precautions Precaution Comments: R + ABD drain    Mobility  Bed Mobility                  Transfers Overall transfer level: Modified independent Equipment used: None   Sit to Stand: Modified independent (Device/Increase time)            Ambulation/Gait Ambulation/Gait assistance: Modified independent (Device/Increase time) Ambulation Distance (Feet): 150 Feet Assistive device: 4-wheeled walker Gait Pattern/deviations: Antalgic     General Gait Details: practiced sitting on RW x 2    Stairs            Wheelchair Mobility    Modified Rankin (Stroke Patients Only)       Balance                                    Cognition Arousal/Alertness: Awake/alert Behavior During Therapy: WFL for tasks assessed/performed                        Exercises      General Comments        Pertinent Vitals/Pain Pain Score: 3  Pain Location: abdomen Pain Descriptors / Indicators: Discomfort    Home Living                      Prior Function            PT Goals (current  goals can now be found in the care plan section) Progress towards PT goals: Progressing toward goals    Frequency  Min 3X/week    PT Plan Current plan remains appropriate    Co-evaluation             End of Session   Activity Tolerance: Patient tolerated treatment well Patient left: in chair;with call bell/phone within reach     Time: 1020-1053 PT Time Calculation (min) (ACUTE ONLY): 33 min  Charges:  $Gait Training: 8-22 mins $Self Care/Home Management: 8-22                    G Codes:      [name] 08/19/2015, 12:08 PM Tresa Endo PT [phone]

## 2015-08-19 NOTE — Progress Notes (Signed)
Patient ID: Erin Santiago, female   DOB: [birth year], 60 y.o.   MRN: [id number]    Referring Physician(s): Stark Klein  Supervising Physician: Sandi Mariscal  Chief Complaint: Intra-abdominal   Subjective: Patient feels well today.  Had left sided drain removed yesterday.  Allergies: Review of patient's allergies indicates no known allergies.  Medications: Prior to Admission medications   Medication Sig Start Date End Date Taking? Authorizing Provider  calcium carbonate (TUMS - DOSED IN MG ELEMENTAL CALCIUM) 500 MG chewable tablet Chew 2 tablets by mouth daily.    Yes Historical Provider, MD  dexamethasone (DECADRON) 4 MG tablet Take 2 tablets by mouth once a day on the day after chemotherapy and then take 2 tablets two times a day for 2 days. Take with food. 07/27/15  Yes Lennis Marion Downer, MD  docusate sodium (COLACE) 100 MG capsule Take 100 mg by mouth 2 (two) times daily as needed for mild constipation. Reported on 05/28/2015 07/17/14  Yes Historical Provider, MD  hydrocortisone 2.5 % cream Apply 1 application topically as needed (itch/psoriasis.). Reported on 07/17/2015 05/19/14  Yes Historical Provider, MD  lidocaine-prilocaine (EMLA) cream Apply to Porta-cath 1-2 hrs prior to access. Cover with [employer]. 08/18/14  Yes Lennis Marion Downer, MD  LORazepam (ATIVAN) 1 MG tablet Place 1/2 to 1 tablet under the tongue or swallow every 6 hours as needed for nausea 07/22/15  Yes Lennis P Livesay, MD  meloxicam (MOBIC) 15 MG tablet Take 15 mg by mouth daily.  06/09/14  Yes Historical Provider, MD  ondansetron (ZOFRAN) 8 MG tablet Take 1 tablet (8 mg total) by mouth every 8 (eight) hours as needed for nausea or vomiting. 07/22/15  Yes Lennis Marion Downer, MD  prochlorperazine (COMPAZINE) 10 MG tablet Take 1 tablet (10 mg total) by mouth every 6 (six) hours as needed (Nausea or vomiting). 07/27/15  Yes Lennis Marion Downer, MD  Psyllium 28.3 % POWD Take 1 packet by mouth daily as needed (for fiber). Reported on  05/26/2015   Yes Historical Provider, MD  Resveratrol 250 MG CAPS Take 250 mg by mouth 2 (two) times daily. Reported on 05/26/2015   Yes Historical Provider, MD  ferrous fumarate (HEMOCYTE) 325 (106 FE) MG TABS tablet Take 1 tab daily on an empty stomach with OJ or Vitamin C 500 mg Patient not taking: Reported on 07/09/2015 09/15/14   Lennis Marion Downer, MD  sucralfate (CARAFATE) 1 GM/10ML suspension Take 10 mLs (1 g total) by mouth 4 (four) times daily -  with meals and at bedtime. Patient not taking: Reported on 07/30/2015 05/20/15   [name], MD    Vital Signs: BP 114/60 mmHg  Pulse 86  Temp(Src) 98.7 F (37.1 C) (Oral)  Resp 20  Ht 5' (1.524 m)  Wt 214 lb 14.4 oz (97.478 kg)  BMI 41.97 kg/m2  SpO2 97%  LMP 02/04/2014  Physical Exam: Abd: soft, NT, ND, +BS, right sided drain with feculent bloody fluid. 90cc/24hrs  Imaging: Ct Abdomen Pelvis W Contrast  08/18/2015  CLINICAL DATA:  Re-evaluate intra-abdominal abscess EXAM: CT ABDOMEN AND PELVIS WITH CONTRAST TECHNIQUE: Multidetector CT imaging of the abdomen and pelvis was performed using the standard protocol following bolus administration of intravenous contrast. CONTRAST:  168m OMNIPAQUE IOHEXOL 300 MG/ML  SOLN COMPARISON:  08/12/2015, 08/11/2015 FINDINGS: Lower chest: Mild bilateral lung base atelectasis. No pleural effusions identified. There is mitral valve calcification. Hepatobiliary: Negative Pancreas: Pancreas is normal Spleen: Spleen is normal Adrenals/Urinary Tract: The adrenal glands are normal. Right kidney  is normal. Mild left hydroureter again identified due to pelvic adenopathy. Dilatation of the left renal pelvis and ureter mildly less prominent when compared to 08/11/2015. Stomach/Bowel: Status post sleeve gastrectomy. Nonobstructive bowel gas pattern. Distal large bowel diverticulosis again identified. Vascular/Lymphatic: No acute vascular abnormalities. Stable upper aortocaval 14 mm lymph node. Stable 43 x 34 mm  partially necrotic lymph node mass in the aortocaval region at the level of the lower pole the kidneys. Stable 22 mm lymph node adjacent to the left common iliac artery. Stable 14 mm pelvic sidewall lymph node along the inner margin of the right acetabulum. Reproductive: Uterus is absent.  No pelvic masses. Other: Since 08/11/2015 a second pelvic drain has been placed. The previous right inferior anterior pelvic drain is again identified, and the air-fluid level containing structure associated with it has decreased in size significantly to about 3 cm in diameter. The left anterior pelvic drain, with tip extending obliquely into the right posterior pelvis, has been placed with significant reduction in the size of abscess to which it was targeted. This component of the abscess now measures only about 2 cm. In total the abscess previously measured 7 x 11 cm. The abscess is now difficult to measure as 1 abnormality as the anterior and posterior components described above no longer demonstrate obvious acute indication. Seen best on image number 68, just cephalad to the course of the initial right-sided pelvic drain, there is a persistent 4 cm abscess in the anterior right lower quadrant. This is unchanged. Musculoskeletal: There are no acute musculoskeletal findings IMPRESSION: Lobulated pelvic abscess previously identified is now markedly smaller, with unchanged position of previous right-sided drain and significant reduction in size of fluid collection associated with it, and placement of second left-sided pelvic drain with market reduction in the component of the abscess as well. A third, approximately 4 cm abscess component in the right lower quadrant anteriorly is unchanged. Electronically Signed   By: Skipper Cliche M.D.   On: 08/18/2015 12:49    Labs:  CBC:  Recent Labs  08/13/15 0822 08/15/15 0612 08/16/15 0623 08/17/15 0532  WBC 9.5 8.8 10.0 9.5  HGB 9.7* 9.3* 8.9* 8.9*  HCT 30.7* 29.4* 28.4*  28.2*  PLT 465* 461* 413* 420*    COAGS:  Recent Labs  08/26/14 1300 07/30/15 2242 07/31/15 0428 08/12/15 0539  INR 0.95 1.49 1.41 1.35  APTT 27  --  32  --     BMP:  Recent Labs  08/12/15 0539 08/13/15 0822 08/16/15 0623 08/17/15 0532  NA 140 139 142 139  K 4.1 3.9 4.2 4.3  CL 103 104 105 104  CO2 '26 25 27 25  '$ GLUCOSE 129* 113* 103* 107*  BUN '17 18 19 16  '$ CALCIUM 8.9 8.8* 8.8* 9.0  CREATININE 0.54 0.67 0.53 0.60  GFRNONAA >60 >60 >60 >60  GFRAA >60 >60 >60 >60    LIVER FUNCTION TESTS:  Recent Labs  08/06/15 0513 08/10/15 0604 08/13/15 0822 08/17/15 0532  BILITOT 1.4* 1.3* 1.0 0.7  AST 11* 21 13* 27  ALT 10* 8* 11* 25  ALKPHOS 50 70 101 184*  PROT 6.1* 6.8 7.1 6.9  ALBUMIN 2.5* 2.8* 2.7* 2.8*    Assessment and Plan: Met endom ca; s/p drainage of RLQ abd abscess 2/24 and 3/8 -patient's LLQ drain was removed yesterday. -RLQ drain smells and appears to be feculent with bloody output.  She will need a drain injection at some point. -will follow while here and go ahead and  set up a drain clinic appointment in 1-2 weeks.  Electronically Signed: Henreitta Cea 08/19/2015, 2:05 PM   I spent a total of 15 Minutes at the the patient's bedside AND on the patient's hospital floor or unit, greater than 50% of which was counseling/coordinating care for intra-abdominal abscesses.

## 2015-08-19 NOTE — Progress Notes (Signed)
Subjective: She is doing well, still sore and still draining bilious material from her RLQ drain.  Tolerating full liquids.    Objective: Vital signs in last 24 hours: Temp:  [98.6 F (37 C)-99 F (37.2 C)] 98.6 F (37 C) (03/15 1424) Pulse Rate:  [71-86] 71 (03/15 1424) Resp:  [16-20] 16 (03/15 1424) BP: (102-118)/(59-61) 102/61 mmHg (03/15 1424) SpO2:  [97 %-99 %] 98 % (03/15 1424) Last BM Date: 08/19/15  CT:  Lobulated pelvic abscess previously identified is now markedly smaller, with unchanged position of previous right-sided drain and significant reduction in size of fluid collection associated with it, and placement of second left-sided pelvic drain with market reduction in the component of the abscess as well. A third, approximately 4 cm abscess component in the right lower quadrant anteriorly is unchanged.   Intake/Output from previous day: 03/14 0701 - 03/15 0700 In: -  Out: 90 [Drains:90] Intake/Output this shift: Total I/O In: 365 [P.O.:360; Other:5] Out: -   General appearance: alert, cooperative and no distress GI: soft, sore, sites OK drainage is as noted above.   Lab Results:   Recent Labs  08/17/15 0532  WBC 9.5  HGB 8.9*  HCT 28.2*  PLT 420*    BMET  Recent Labs  08/17/15 0532  NA 139  K 4.3  CL 104  CO2 25  GLUCOSE 107*  BUN 16  CREATININE 0.60  CALCIUM 9.0   PT/INR No results for input(s): LABPROT, INR in the last 72 hours.   Recent Labs Lab 08/13/15 0822 08/17/15 0532  AST 13* 27  ALT 11* 25  ALKPHOS 101 184*  BILITOT 1.0 0.7  PROT 7.1 6.9  ALBUMIN 2.7* 2.8*     Lipase     Component Value Date/Time   LIPASE 18 07/30/2015 1251     Studies/Results: Ct Abdomen Pelvis W Contrast  08/18/2015  CLINICAL DATA:  Re-evaluate intra-abdominal abscess EXAM: CT ABDOMEN AND PELVIS WITH CONTRAST TECHNIQUE: Multidetector CT imaging of the abdomen and pelvis was performed using the standard protocol following bolus administration of  intravenous contrast. CONTRAST:  18mL OMNIPAQUE IOHEXOL 300 MG/ML  SOLN COMPARISON:  08/12/2015, 08/11/2015 FINDINGS: Lower chest: Mild bilateral lung base atelectasis. No pleural effusions identified. There is mitral valve calcification. Hepatobiliary: Negative Pancreas: Pancreas is normal Spleen: Spleen is normal Adrenals/Urinary Tract: The adrenal glands are normal. Right kidney is normal. Mild left hydroureter again identified due to pelvic adenopathy. Dilatation of the left renal pelvis and ureter mildly less prominent when compared to 08/11/2015. Stomach/Bowel: Status post sleeve gastrectomy. Nonobstructive bowel gas pattern. Distal large bowel diverticulosis again identified. Vascular/Lymphatic: No acute vascular abnormalities. Stable upper aortocaval 14 mm lymph node. Stable 43 x 34 mm partially necrotic lymph node mass in the aortocaval region at the level of the lower pole the kidneys. Stable 22 mm lymph node adjacent to the left common iliac artery. Stable 14 mm pelvic sidewall lymph node along the inner margin of the right acetabulum. Reproductive: Uterus is absent.  No pelvic masses. Other: Since 08/11/2015 a second pelvic drain has been placed. The previous right inferior anterior pelvic drain is again identified, and the air-fluid level containing structure associated with it has decreased in size significantly to about 3 cm in diameter. The left anterior pelvic drain, with tip extending obliquely into the right posterior pelvis, has been placed with significant reduction in the size of abscess to which it was targeted. This component of the abscess now measures only about 2 cm. In total  the abscess previously measured 7 x 11 cm. The abscess is now difficult to measure as 1 abnormality as the anterior and posterior components described above no longer demonstrate obvious acute indication. Seen best on image number 68, just cephalad to the course of the initial right-sided pelvic drain, there is a  persistent 4 cm abscess in the anterior right lower quadrant. This is unchanged. Musculoskeletal: There are no acute musculoskeletal findings IMPRESSION: Lobulated pelvic abscess previously identified is now markedly smaller, with unchanged position of previous right-sided drain and significant reduction in size of fluid collection associated with it, and placement of second left-sided pelvic drain with market reduction in the component of the abscess as well. A third, approximately 4 cm abscess component in the right lower quadrant anteriorly is unchanged. Electronically Signed   By: Skipper Cliche M.D.   On: 08/18/2015 12:49    Medications: . acetaminophen  1,000 mg Oral TID  . antiseptic oral rinse  7 mL Mouth Rinse BID  . feeding supplement  1 Container Oral TID BM  . feeding supplement (ENSURE ENLIVE)  237 mL Oral TID WC  . insulin aspart  0-15 Units Subcutaneous 4 times per day  . nystatin  5 mL Oral QID  . nystatin   Topical BID  . saccharomyces boulardii  250 mg Oral BID  . sodium chloride flush  3 mL Intravenous Q12H    Assessment/Plan IR drain 07/31/15 - culture: E coli CT IMAGE GUIDED DRAINAGE BY PERCUTANEOUS CATHETER, 08/12/15,  LEFT lower abdomen 220 ml drained; Dr. 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 Will need to follow drain output as we advance her diet.  Follow up in the drain clinic will probably be needed.  She had her surgery at [facility] and may  need to follow up with them if this becomes a long term issues.  Dr. Excell Seltzer or Dr Barry Dienes could also see her in town if [facility] does not follow with her.      LOS: 20 days    Tyreona Panjwani C. [id number]

## 2015-08-19 NOTE — Progress Notes (Addendum)
Calorie Count Note  48 hour calorie count ordered.  Diet: Soft Supplements: Ensure Enlive TID, Boost Breeze TID; Clinimix E 5/15 @ 40 mL/hr with 20% lipids @ 10 mL/hr  Nutrition Dx: Inadequate oral intake related to altered GI function as evidenced by per patient/family report.  Goal: Patient will meet greater than or equal to 90% of their needs  Intervention:  - Continue TPN per pharmacy. - Diet advancement as medically feasible. - Continue Ensure Enlive TID and Boost Breeze TID. - RD will continue to monitor for supplement acceptance and nutrition needs.  Consult received today for: We want to get her on a soft diet, see how she does with it. We would like to get her off TNA and home on diet with just a drain. Calorie/protein count.  Pt reports that this AM she had a small amount of grits for breakfast. She plans to drink at least one Ensure and one Boost Breeze each later today. She states that she is sometimes nervous to consume PO due to unsure if "the hole is closed" and she is hoping to receive further information pertaining to this so she feels more comfortable with PO intakes. RD will follow-up 3/16 with kcal results for day 1.    [name], RD, LDN Inpatient Clinical Dietitian Pager # [phone] After hours/weekend pager # [phone]

## 2015-08-19 NOTE — Discharge Summary (Deleted)
Subjective: She is doing well, still sore and still draining from her RLQ drain.  Same old blood colored fluid.  Tolerating full liquids.    Objective: Vital signs in last 24 hours: Temp:  [98.6 F (37 C)-99 F (37.2 C)] 98.7 F (37.1 C) (03/15 0545) Pulse Rate:  [78-86] 86 (03/15 0545) Resp:  [16-20] 20 (03/15 0545) BP: (107-118)/(50-60) 114/60 mmHg (03/15 0545) SpO2:  [97 %-100 %] 97 % (03/15 0545) Last BM Date: 08/19/15 Drain removed from LLQ yesterday RLQ drain with 90 ml of the red thick drainage. Afebrile, VSS No labs today CT yesterday:  Lobulated pelvic abscess previously identified is now markedly smaller, with unchanged position of previous right-sided drain and significant reduction in size of fluid collection associated with it, and placement of second left-sided pelvic drain with market reduction in the component of the abscess as well. A third, approximately 4 cm abscess component in the right lower quadrant anteriorly is unchanged.   Intake/Output from previous day: 03/14 0701 - 03/15 0700 In: -  Out: 90 [Drains:90] Intake/Output this shift: Total I/O In: 5 [Other:5] Out: -   General appearance: alert, cooperative and no distress GI: soft, sore, sites OK drainage is as noted above.   Lab Results:   Recent Labs  08/17/15 0532  WBC 9.5  HGB 8.9*  HCT 28.2*  PLT 420*    BMET  Recent Labs  08/17/15 0532  NA 139  K 4.3  CL 104  CO2 25  GLUCOSE 107*  BUN 16  CREATININE 0.60  CALCIUM 9.0   PT/INR No results for input(s): LABPROT, INR in the last 72 hours.   Recent Labs Lab 08/13/15 0822 08/17/15 0532  AST 13* 27  ALT 11* 25  ALKPHOS 101 184*  BILITOT 1.0 0.7  PROT 7.1 6.9  ALBUMIN 2.7* 2.8*     Lipase     Component Value Date/Time   LIPASE 18 07/30/2015 1251     Studies/Results: Ct Abdomen Pelvis W Contrast  08/18/2015  CLINICAL DATA:  Re-evaluate intra-abdominal abscess EXAM: CT ABDOMEN AND PELVIS WITH CONTRAST TECHNIQUE:  Multidetector CT imaging of the abdomen and pelvis was performed using the standard protocol following bolus administration of intravenous contrast. CONTRAST:  125mL OMNIPAQUE IOHEXOL 300 MG/ML  SOLN COMPARISON:  08/12/2015, 08/11/2015 FINDINGS: Lower chest: Mild bilateral lung base atelectasis. No pleural effusions identified. There is mitral valve calcification. Hepatobiliary: Negative Pancreas: Pancreas is normal Spleen: Spleen is normal Adrenals/Urinary Tract: The adrenal glands are normal. Right kidney is normal. Mild left hydroureter again identified due to pelvic adenopathy. Dilatation of the left renal pelvis and ureter mildly less prominent when compared to 08/11/2015. Stomach/Bowel: Status post sleeve gastrectomy. Nonobstructive bowel gas pattern. Distal large bowel diverticulosis again identified. Vascular/Lymphatic: No acute vascular abnormalities. Stable upper aortocaval 14 mm lymph node. Stable 43 x 34 mm partially necrotic lymph node mass in the aortocaval region at the level of the lower pole the kidneys. Stable 22 mm lymph node adjacent to the left common iliac artery. Stable 14 mm pelvic sidewall lymph node along the inner margin of the right acetabulum. Reproductive: Uterus is absent.  No pelvic masses. Other: Since 08/11/2015 a second pelvic drain has been placed. The previous right inferior anterior pelvic drain is again identified, and the air-fluid level containing structure associated with it has decreased in size significantly to about 3 cm in diameter. The left anterior pelvic drain, with tip extending obliquely into the right posterior pelvis, has been placed with significant reduction  in the size of abscess to which it was targeted. This component of the abscess now measures only about 2 cm. In total the abscess previously measured 7 x 11 cm. The abscess is now difficult to measure as 1 abnormality as the anterior and posterior components described above no longer demonstrate obvious  acute indication. Seen best on image number 68, just cephalad to the course of the initial right-sided pelvic drain, there is a persistent 4 cm abscess in the anterior right lower quadrant. This is unchanged. Musculoskeletal: There are no acute musculoskeletal findings IMPRESSION: Lobulated pelvic abscess previously identified is now markedly smaller, with unchanged position of previous right-sided drain and significant reduction in size of fluid collection associated with it, and placement of second left-sided pelvic drain with market reduction in the component of the abscess as well. A third, approximately 4 cm abscess component in the right lower quadrant anteriorly is unchanged. Electronically Signed   By: Skipper Cliche M.D.   On: 08/18/2015 12:49    Medications: . acetaminophen  1,000 mg Oral TID  . antiseptic oral rinse  7 mL Mouth Rinse BID  . feeding supplement  1 Container Oral TID BM  . feeding supplement (ENSURE ENLIVE)  237 mL Oral TID WC  . insulin aspart  0-15 Units Subcutaneous 4 times per day  . nystatin  5 mL Oral QID  . nystatin   Topical BID  . piperacillin-tazobactam (ZOSYN)  IV  3.375 g Intravenous 3 times per day  . saccharomyces boulardii  250 mg Oral BID  . sodium chloride flush  3 mL Intravenous Q12H    Assessment/Plan IR drain 07/31/15 - culture: E coli CT IMAGE GUIDED DRAINAGE BY PERCUTANEOUS CATHETER, 08/12/15,  LEFT lower abdomen 220 ml drained; Dr. 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 Follow up in the drain clinic.  She had her surgery at [facility] and would recommend she follow up with them.  Dr. Excell Seltzer could also see her in town if [facility] does not follow with her.      LOS: 20 days    Erin Santiago 08/19/2015

## 2015-08-20 LAB — COMPREHENSIVE METABOLIC PANEL
ALBUMIN: 2.9 g/dL — AB (ref 3.5–5.0)
ALK PHOS: 140 U/L — AB (ref 38–126)
ALT: 21 U/L (ref 14–54)
AST: 19 U/L (ref 15–41)
Anion gap: 9 (ref 5–15)
BUN: 15 mg/dL (ref 6–20)
CHLORIDE: 103 mmol/L (ref 101–111)
CO2: 26 mmol/L (ref 22–32)
Calcium: 8.8 mg/dL — ABNORMAL LOW (ref 8.9–10.3)
Creatinine, Ser: 0.58 mg/dL (ref 0.44–1.00)
GFR calc Af Amer: >60 mL/min (ref >60)
GLUCOSE: 108 mg/dL — AB (ref 65–99)
POTASSIUM: 3.5 mmol/L (ref 3.5–5.1)
Sodium: 138 mmol/L (ref 135–145)
Total Bilirubin: 0.6 mg/dL (ref 0.3–1.2)
Total Protein: 7.1 g/dL (ref 6.5–8.1)

## 2015-08-20 LAB — GLUCOSE, CAPILLARY
GLUCOSE-CAPILLARY: 118 mg/dL — AB (ref 65–99)
Glucose-Capillary: 97 mg/dL (ref 65–99)

## 2015-08-20 LAB — PHOSPHORUS: Phosphorus: 3.3 mg/dL (ref 2.5–4.6)

## 2015-08-20 LAB — MAGNESIUM: Magnesium: 1.9 mg/dL (ref 1.7–2.4)

## 2015-08-20 NOTE — Progress Notes (Signed)
MEDICAL ONCOLOGY August 20, 2015, 8:40 AM  Hospital day 22 Antibiotics: DCd Chemotherapy: planned adriamycin olaratumab on hold due to present complications  Outpatient Physicians:Emma Rossi/ Andrew Au, [name], [name] [facility] sarcoma clinic),[name] (PCP [facility]), [name]; [name], [name]  Subjective:  Patient is seen, then discussed also with Will Creig Hines at bedside. Having pain LLQ in area of drain just prior to every bowel movement. Tolerating Boost Breeze, but has been afraid to try to advance diet - discussed. Denies SOB, URI symptoms improving, minimal nonproductive cough. No problems with PAC or peripheral IV. No bleeding. No swelling LE.    ONCOLOGIC HISTORY  Patient had been menopausal since age 50 until she had what seemed to her to be a menstrual period in Sept 2015, with large blood clot at completion of bleeding stopped then. She continued with lesser bleeding over next several months until she was seen in 06-2014 by [name]. Hemoglobin was 12.7 on 07-04-14. CT CAP 06-25-14 had uterus 16.4 x 13.5 x 14.4 cm with marked expansion of endometrial canal by heterogeneous mass, bilateral pelvic sidewall adenopathy, iliac adenopathy with no definite retroperitoneal or mesenteric adenopathy, no ascites, no liver mets. Attempted endometrial sampling 06-26-14 and 07-03-14 was nondiagnostic; around that time the patient was passing clots and using one large maxipad hourly. She was seen by [name] 07-05-14, with uterine fundus palpable above umbilicus. She had surgery by [name] at [facility] on 07-14-14, which was exploratory laparotomy with TAH BSO, bilateral total pelvic lymphadenectomy and sampling of aortic and renal nodes. 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 Case was presented  at [facility] multidisciplinary conference 07-23-14, with recommendation for PET CT to evaluate inguinal and portahepatis nodes, and to consider adjuvant gemzar taxotere and possibly follow with 4 cycles of adriamycin, then to consider whole pelvic RT. She saw [name] for post op follow up on 07-28-14, with recommendation for gemzar taxotere and adriamycin, whole pelvic RT after chemo and consideration of Megace maintenance after chemo (possibly prior to negative ER PR information). PET 08-01-14 had some uptake in aortocaval and left paraaortic regions. She had day 1 cycle 1 gemzar taxotere on 08-28-14, day 8 cycle 1 on 09-04-14 and neulasta on 09-06-14. Admitted with neutropenic fever on day 14 cycle 1, with oral mucositis and some diarrhea. Counts maintained cycle 2 using OnPro neulasta day 9. She had progressive LE swelling after cycle 3, with venous dopplers negative 10-23-14, then LE swelling and SOB so marked after day 1 cycle 4 10-30-14 that chemo was held. Restaging CT AP + CXR 11-10-14 had stable tiny left pulmonary nodule/ no pleural effusion and normal heart size; CT had necrotic aortocaval adenopathy, iliac adenopathy L>R and 7x10 cm fluid collection in pelvis. She received IMRT 45 gray in 25 fractions to pelvis from 6-27 thru 01-05-15, with resolution of LE swelling by completion of course. CT CAP 02-03-15 showed improvement in retroperitoneal and pelvic involvement. She went onto observation after RT, plan to wait until clear progressive disease before resuming treatment, possibly adriamycin vs on study. CT CAP 05-07-15 showed new lytic lesion at T4, stable 2-3 mm pulmonary nodules, increased left pelvic and retroperitoneal nodes and RLQ peritoneal nodule. Radiation therapy summary as follows: Indication for treatment: A new bone lesion in the right side of the T4 vertebral body, with soft tissue extension slightly  impinging upon the central spinal canal, some pain from this lesion Radiation treatment dates:  05/18/2015 through 06/05/2015 Site/dose: T4 thoracic spine area, 35 gray in 14 fractions She was not eligible for any of the arms of MATCH trial by evaluation in Jan 2017. Plan was to begin treatment with adriamycin + olaratumab, however this not begun prior to hospitalization 07-30-15 with abdominal/ pelvic abscess.   Objective: Vital signs in last 24 hours: Blood pressure 123/56, pulse 81, temperature 98.6 F (37 C), temperature source Oral, resp. rate 17, height 5' (1.524 m), weight 214 lb 14.4 oz (97.478 kg), last menstrual period 02/04/2014, SpO2 96 %. Awake, alert, a little tearful at times as she is concerned about follow up either with [facility] or [facility]. Respirations not labored RA. Minimal cough x 1. Less nasal congestion. Heart RRR. PAC site fine, infusing 1/2 strength TNA. Lungs without wheezes or rales. Abdomen obese, soft, not obviously distended. Dark brown liquid in drainage bag from LLQ. LE no pitting edema, cords, tenderness. Moves all extremities easily.  Intake/Output from previous day: 03/15 0701 - 03/16 0700 In: 370 [P.O.:360] Out: 50 [Drains:50] Intake/Output this shift:      Lab Results: No results for input(s): WBC, HGB, HCT, PLT in the last 72 hours. BMET  Recent Labs  08/20/15 0534  NA 138  K 3.5  CL 103  CO2 26  GLUCOSE 108*  BUN 15  CREATININE 0.58  CALCIUM 8.8*    Studies/Results: Ct Abdomen Pelvis W Contrast  08/18/2015  CLINICAL DATA:  Re-evaluate intra-abdominal abscess EXAM: CT ABDOMEN AND PELVIS WITH CONTRAST TECHNIQUE: Multidetector CT imaging of the abdomen and pelvis was performed using the standard protocol following bolus administration of intravenous contrast. CONTRAST:  136mL OMNIPAQUE IOHEXOL 300 MG/ML  SOLN COMPARISON:  08/12/2015, 08/11/2015 FINDINGS: Lower chest: Mild bilateral lung base atelectasis. No pleural effusions identified. There is mitral valve calcification. Hepatobiliary: Negative Pancreas: Pancreas is normal Spleen: Spleen is  normal Adrenals/Urinary Tract: The adrenal glands are normal. Right kidney is normal. Mild left hydroureter again identified due to pelvic adenopathy. Dilatation of the left renal pelvis and ureter mildly less prominent when compared to 08/11/2015. Stomach/Bowel: Status post sleeve gastrectomy. Nonobstructive bowel gas pattern. Distal large bowel diverticulosis again identified. Vascular/Lymphatic: No acute vascular abnormalities. Stable upper aortocaval 14 mm lymph node. Stable 43 x 34 mm partially necrotic lymph node mass in the aortocaval region at the level of the lower pole the kidneys. Stable 22 mm lymph node adjacent to the left common iliac artery. Stable 14 mm pelvic sidewall lymph node along the inner margin of the right acetabulum. Reproductive: Uterus is absent.  No pelvic masses. Other: Since 08/11/2015 a second pelvic drain has been placed. The previous right inferior anterior pelvic drain is again identified, and the air-fluid level containing structure associated with it has decreased in size significantly to about 3 cm in diameter. The left anterior pelvic drain, with tip extending obliquely into the right posterior pelvis, has been placed with significant reduction in the size of abscess to which it was targeted. This component of the abscess now measures only about 2 cm. In total the abscess previously measured 7 x 11 cm. The abscess is now difficult to measure as 1 abnormality as the anterior and posterior components described above no longer demonstrate obvious acute indication. Seen best on image number 68, just cephalad to the course of the initial right-sided pelvic drain, there is a persistent 4 cm abscess in the anterior right lower quadrant. This  is unchanged. Musculoskeletal: There are no acute musculoskeletal findings IMPRESSION: Lobulated pelvic abscess previously identified is now markedly smaller, with unchanged position of previous right-sided drain and significant reduction in size  of fluid collection associated with it, and placement of second left-sided pelvic drain with market reduction in the component of the abscess as well. A third, approximately 4 cm abscess component in the right lower quadrant anteriorly is unchanged. Electronically Signed   By: Skipper Cliche M.D.   On: 08/18/2015 12:49       Assessment/Plan: 1. LLQ/ pelvic abscess in setting of progressive endometrial stromal sarcoma:clinically improving with IR drains. Continuing antibiotics and TNA. Repeat CT noted. Consideration of surgical intervention here or [facility] vs continuing drainage at home. I have let gyn oncology know situation, for their input re consultation with [facility] general surgery 2.Endometrial stromal sarcoma: surgery [facility] 07-14-14, progression by start of cycle 4 adjuvant gemzar taxotere in 10-2014. Radiation to symptomatic aortocaval and bilateral iliac adenopathy. Further progression recently including T4, post radiation there. Not eligible for any of the arms of MATCH trial. Plan had been to start adriamycin + olaratumab just at time of this complication, held with these acute problems, expect to begin outpatient when stable. Long term prognosis from the metastatic endometrial sarcoma is not good, however disease has not been rapidly progressive and patient has been very functional.  3.nutritional status: on clear liquid diet + TNA. Likes [employer]. TNA has been weaned and will be DCd. 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 Hx nonmelanoma skin cancer. 9.multifactorial anemia stable, iron low normal and would be best to resume po when able to take 10.viral URI on symptomatic treatment. 11.deconditioning secondary to above: lives alone, does have help arranged to care for animals  Please page me  if I can be of help between my rounds [place]

## 2015-08-20 NOTE — Progress Notes (Signed)
[facility] PROGRESS NOTE  Erin Santiago [id number] DOB: March 17, 1956 DOA: 07/30/2015 PCP: Merrilee Seashore, MD  Brief interval history  Erin Santiago is a 60 y.o. 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 Patient is status post chemotherapy and radiation to the pelvis and back and was due to start chemotherapy on the day of admission due to progression of disease involving the left abdominal pain. Presented to the ED with complaints of 1-2 day history of diffuse abdominal pain. CT abdomen and pelvis showed bowel perforation. Large 9 x 4.5 cm complex right lower quadrant and pelvic abscess containing a small amount of contrast suggesting communication with the distal ileum. general surgery consulted recommended initially nonoperative percutaneous drainage.She was placed on bowel rest and monitored. Interventional radiology was also consulted for drain placement. Patient had a percutaneous drain placed on 07/31/2015 and was placed empirically on IV Zosyn from admission. Wound/abscess cultures grew out Escherichia coli. Started on clears, Repeat CT abdomen and pelvis pending for 08/04/2015   HPI/Subjective: Patient sitting at bedside chair- states her nasal stuffiness is improving- has a sore area on her but - discussed measures to alleviate pressure- no nausea, abdominal pain or vomiting.   Assessment/Plan: #1 Bowel perforation, complex intra-abdominal fluid collection 9 x 4.5 cm right lower quadrant and pelvic -likely due to tumor invasion -Per CT abdomen and pelvis appears that perforation may have walled off and contained.  -S/p RLQ percutaneous drain 07/31/2015 per IR.  Wound/abscess cultures growing Escherichia coli. She has been on IV Zosyn.  -Gen. surgery and interventional radiology following. -On 08/11/2015 repeat CT scan of abdomen  and pelvis did not reveal significant change in large pelvic abscess per radiology, measuring 7 x 11.5 cm -3/8 underwent CT guided placed of a 10 Fr drainage catheter placement into the pelvis via the left lower abdomen yielding 220 cc of purulent material.  - onTNA  -Case was discussed with surgery who recommended monitoring patient off of antibiotics, and advancing her diet to see how she does. Plan to discharge her home this weekend with drain in place and have her follow-up with general surgery and the drain clinic. -On 08/20/2015 case discussed with general surgery, patient will require close follow up with GYN oncology at [facility].   #2 history of iron deficiency anemia -Stable, monitor periodically  #3 dehydration/metabolic acidosis -Resolving. She was treated with IV fluids.   #4 hypokalemia -Potassium improved   #5 lactic acidosis Likely secondary to problem #1 and dehydration.   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 Patient status post chemotherapy and radiation to the pelvis. Patient was to start chemotherapy on the day of admission due to progression of disease involving the left, iliac adenopathy, 2 masses in the right lower quadrant seen on CT scan on 07/21/2015.   -She had a repeat CT scan on 08/11/2015 that revealed mild progression of abdominal pelvic lymphadenopathy consistent with progression of metastatic disease. -Radiology also revealed increase in mild hydronephrosis due to metastatic lymphadenopathy. Renal function remains stable.  -Dr. Marko Plume of medical oncology following, overall prognosis is poor  #7 Sinus congestion pseudoephedrine PRN- improving per patient  prophylaxis SCDs for DVT prophylaxis.  Code Status: Full Family Communication: Updated patient. No family at bedside. Disposition Plan: Possible discharge  this weekend if she continues to  improve the remaining stable off of IV antibiotic therapy   Consultants:  Gen. surgery: Dr. Excell Seltzer 07/30/2015   oncology: Dr. Marko Plume 07/31/2015  Interventional radiology  Procedures:  CT abdomen and pelvis 07/30/2015  Acute abdominal series 07/30/2015  Right lower quadrant abdominal abscess drain with CT 07/31/2015 for Dr. Annamaria Boots  Antibiotics:  IV Zosyn 07/30/2015   Objective: Filed Vitals:   08/20/15 0621 08/20/15 1450  BP: 123/56 141/44  Pulse: 81 90  Temp: 98.6 F (37 C) 98.6 F (37 C)  Resp: 17 18    Intake/Output Summary (Last 24 hours) at 08/20/15 1651 Last data filed at 08/20/15 1600  Gross per 24 hour  Intake    498 ml  Output     90 ml  Net    408 ml   Filed Weights   08/07/15 0500 08/07/15 1536 08/17/15 0500  Weight: 97.07 kg (214 lb) 96.7 kg (213 lb 3 oz) 97.478 kg (214 lb 14.4 oz)    Exam:   General:  NAD, sitting at bedside chair, nontoxic  Cardiovascular: RRR  Respiratory: Normal respiratory effort, lungs are clear to auscultation bilaterally  Abdomen: Soft, nontender, nondistended. Drain in [employer]- with a large amount of brown liquid- drain in RLQ draining only a scant amount of blood  Musculoskeletal: No clubbing cyanosis or edema.   Data Reviewed: Basic Metabolic Panel:  Recent Labs Lab 08/16/15 0623 08/17/15 0532 08/20/15 0534  NA 142 139 138  K 4.2 4.3 3.5  CL 105 104 103  CO2 27 25 26   GLUCOSE 103* 107* 108*  BUN 19 16 15   CREATININE 0.53 0.60 0.58  CALCIUM 8.8* 9.0 8.8*  MG  --  2.0 1.9  PHOS  --  3.8 3.3   Liver Function Tests:  Recent Labs Lab 08/17/15 0532 08/20/15 0534  AST 27 19  ALT 25 21  ALKPHOS 184* 140*  BILITOT 0.7 0.6  PROT 6.9 7.1  ALBUMIN 2.8* 2.9*   No results for input(s): LIPASE, AMYLASE in the last 168 hours. No results for input(s): AMMONIA in the last 168 hours. CBC:  Recent Labs Lab 08/15/15 0612 08/16/15 0623 08/17/15 0532  WBC 8.8 10.0 9.5   NEUTROABS  --   --  8.0*  HGB 9.3* 8.9* 8.9*  HCT 29.4* 28.4* 28.2*  MCV 84.0 87.4 87.6  PLT 461* 413* 420*   Cardiac Enzymes: No results for input(s): CKTOTAL, CKMB, CKMBINDEX, TROPONINI in the last 168 hours. BNP (last 3 results) No results for input(s): BNP in the last 8760 hours.  ProBNP (last 3 results) No results for input(s): PROBNP in the last 8760 hours.  CBG:  Recent Labs Lab 08/19/15 1155 08/19/15 1721 08/19/15 2329 08/20/15 0602 08/20/15 1159  GLUCAP 100* 51* 93 97 118*    Recent Results (from the past 240 hour(s))  Culture, routine-abscess     Status: None   Collection Time: 08/12/15  4:18 PM  Result Value Ref Range Status   Specimen Description DRAINAGE LEFT LOWER ABDOMEN  Final   Special Requests Normal  Final   Gram Stain   Final    ABUNDANT WBC PRESENT,BOTH PMN AND MONONUCLEAR NO SQUAMOUS EPITHELIAL CELLS SEEN ABUNDANT GRAM VARIABLE ROD Performed at [facility]    Culture   Final    FEW ESCHERICHIA COLI Performed at [facility]    Report Status 08/15/2015 FINAL  Final   Organism ID, Bacteria ESCHERICHIA COLI  Final      Susceptibility   Escherichia coli - MIC*  AMPICILLIN <=2 SENSITIVE Sensitive     AMPICILLIN/SULBACTAM <=2 SENSITIVE Sensitive     CEFEPIME <=1 SENSITIVE Sensitive     CEFTAZIDIME <=1 SENSITIVE Sensitive     CEFTRIAXONE <=1 SENSITIVE Sensitive     CIPROFLOXACIN <=0.25 SENSITIVE Sensitive     GENTAMICIN <=1 SENSITIVE Sensitive     IMIPENEM <=0.25 SENSITIVE Sensitive     PIP/TAZO <=4 SENSITIVE Sensitive     TOBRAMYCIN <=1 SENSITIVE Sensitive     TRIMETH/SULFA Value in next row Sensitive      <=20 SENSITIVE(NOTE)    * FEW ESCHERICHIA COLI     Studies: No results found.  Scheduled Meds: . acetaminophen  1,000 mg Oral TID  . antiseptic oral rinse  7 mL Mouth Rinse BID  . feeding supplement  1 Container Oral TID BM  . feeding supplement (ENSURE ENLIVE)  237 mL Oral TID WC  . nystatin  5 mL Oral QID   . nystatin   Topical BID  . saccharomyces boulardii  250 mg Oral BID  . sodium chloride flush  3 mL Intravenous Q12H   Continuous Infusions:    Time spent: 15 mins    Kelvin Cellar, MD [facility] Pager [email or web address], password [facility] 08/20/2015, 4:51 PM  LOS: 21 days

## 2015-08-20 NOTE — Progress Notes (Signed)
NUTRITION NOTE  Calorie count note done from start yesterday afternoon. Spoke with pt as she was sitting in a chair. TPN is now off and pt is appreciative of this and hopeful that she will begin to be able to consume more PO. She states that for breakfast she had scrambled eggs and ate as much as she could of this item and she saved her peaches for later today. Pt states she plans to order lunch and is hopeful that she will be hungry at dinner and order something for that meal.  RD will follow-up 3/17 for any additional needs. Continue supplements as ordered.    [name], RD, LDN Inpatient Clinical Dietitian Pager # [phone] After hours/weekend pager # [phone]

## 2015-08-20 NOTE — Progress Notes (Signed)
Subjective: She is fine, i think Dr. Marko Plume and I have talked her into trying soft diet.  Drainage is about there same, but going down this AM.  She is worried about follow up.  Dr. Marko Plume is going to talk to Dr. Denman George about GYN oncology follow up thru [facility] where she had her original surgery, but Dr. Denman George will not be back till tomorrow.   Objective: Vital signs in last 24 hours: Temp:  [98.6 F (37 C)-98.9 F (37.2 C)] 98.6 F (37 C) (03/16 0621) Pulse Rate:  [71-81] 81 (03/16 0621) Resp:  [16-17] 17 (03/16 0621) BP: (102-123)/(56-67) 123/56 mmHg (03/16 0621) SpO2:  [95 %-98 %] 96 % (03/16 0621) Last BM Date: 08/19/15 PO 360 recorded  Soft diet Drains:  50 recorded Afebrile, VSS CMP OK Intake/Output from previous day: 03/15 0701 - 03/16 0700 In: 370 [P.O.:360] Out: 50 [Drains:50] Intake/Output this shift:    General appearance: alert, cooperative, no distress and very anxious about the whole process GI: soft sore around the drain site.  Lab Results:  No results for input(s): WBC, HGB, HCT, PLT in the last 72 hours.  BMET  Recent Labs  08/20/15 0534  NA 138  K 3.5  CL 103  CO2 26  GLUCOSE 108*  BUN 15  CREATININE 0.58  CALCIUM 8.8*   PT/INR No results for input(s): LABPROT, INR in the last 72 hours.   Recent Labs Lab 08/13/15 0822 08/17/15 0532 08/20/15 0534  AST 13* 27 19  ALT 11* 25 21  ALKPHOS 101 184* 140*  BILITOT 1.0 0.7 0.6  PROT 7.1 6.9 7.1  ALBUMIN 2.7* 2.8* 2.9*     Lipase     Component Value Date/Time   LIPASE 18 07/30/2015 1251     Studies/Results: Ct Abdomen Pelvis W Contrast  08/18/2015  CLINICAL DATA:  Re-evaluate intra-abdominal abscess EXAM: CT ABDOMEN AND PELVIS WITH CONTRAST TECHNIQUE: Multidetector CT imaging of the abdomen and pelvis was performed using the standard protocol following bolus administration of intravenous contrast. CONTRAST:  120mL OMNIPAQUE IOHEXOL 300 MG/ML  SOLN COMPARISON:  08/12/2015, 08/11/2015  FINDINGS: Lower chest: Mild bilateral lung base atelectasis. No pleural effusions identified. There is mitral valve calcification. Hepatobiliary: Negative Pancreas: Pancreas is normal Spleen: Spleen is normal Adrenals/Urinary Tract: The adrenal glands are normal. Right kidney is normal. Mild left hydroureter again identified due to pelvic adenopathy. Dilatation of the left renal pelvis and ureter mildly less prominent when compared to 08/11/2015. Stomach/Bowel: Status post sleeve gastrectomy. Nonobstructive bowel gas pattern. Distal large bowel diverticulosis again identified. Vascular/Lymphatic: No acute vascular abnormalities. Stable upper aortocaval 14 mm lymph node. Stable 43 x 34 mm partially necrotic lymph node mass in the aortocaval region at the level of the lower pole the kidneys. Stable 22 mm lymph node adjacent to the left common iliac artery. Stable 14 mm pelvic sidewall lymph node along the inner margin of the right acetabulum. Reproductive: Uterus is absent.  No pelvic masses. Other: Since 08/11/2015 a second pelvic drain has been placed. The previous right inferior anterior pelvic drain is again identified, and the air-fluid level containing structure associated with it has decreased in size significantly to about 3 cm in diameter. The left anterior pelvic drain, with tip extending obliquely into the right posterior pelvis, has been placed with significant reduction in the size of abscess to which it was targeted. This component of the abscess now measures only about 2 cm. In total the abscess previously measured 7 x 11 cm. The  abscess is now difficult to measure as 1 abnormality as the anterior and posterior components described above no longer demonstrate obvious acute indication. Seen best on image number [id number], just cephalad to the course of the initial right-sided pelvic drain, there is a persistent 4 cm abscess in the anterior right lower quadrant. This is unchanged. Musculoskeletal: There are no  acute musculoskeletal findings IMPRESSION: Lobulated pelvic abscess previously identified is now markedly smaller, with unchanged position of previous right-sided drain and significant reduction in size of fluid collection associated with it, and placement of second left-sided pelvic drain with market reduction in the component of the abscess as well. A third, approximately 4 cm abscess component in the right lower quadrant anteriorly is unchanged. Electronically Signed   By: Skipper Cliche M.D.   On: 08/18/2015 12:49    Medications: . acetaminophen  1,000 mg Oral TID  . antiseptic oral rinse  7 mL Mouth Rinse BID  . feeding supplement  1 Container Oral TID BM  . feeding supplement (ENSURE ENLIVE)  237 mL Oral TID WC  . insulin aspart  0-15 Units Subcutaneous 4 times per day  . nystatin  5 mL Oral QID  . nystatin   Topical BID  . saccharomyces boulardii  250 mg Oral BID  . sodium chloride flush  3 mL Intravenous Q12H    Assessment/Plan IR drain 07/31/15 - culture: E coli CT IMAGE GUIDED DRAINAGE BY PERCUTANEOUS CATHETER, 08/12/15, LEFT lower abdomen 220 ml drained; Dr. Sandi Mariscal LLQ drain removed after CT 08/18/15 Endometrial sarcoma,with bilateral pelvic node involvement/spinal & intraabdominal metastasis Hx of hysterectomy/nephrectomy [facility] 2016 -- chemo/radiation therapy/[facility] Onc Vivien Rossetti, MD  Hx of gastric sleeve  Anemia secondary to chronic blood loss Body mass index is 41.6 malnutrition on TNA Antibiotics: Day 18 Zosyn  Last dose 08/18/15 DVT: SCD's added    Plan:  I will just stop the TNA today she is getting ready to order some real food, she has been taking mostly Breeze PO for the last 2 weeks.  I will work on getting one of our physicians to follow her from [place] if she is not going to be followed at [facility], or in the interim here in [place].  She would like an new drainage bag and shorter tubing for the drain and I ask her to talk with the folks from  IR.  She will also need follow up in the drain clinic after she goes home.  Home health to assist with her drain after she leaves.  Drainage from the remaining drain is a dark red appearing with some odor.  The volume is currently decreasing.  The bag is about 84 weeks old. Will continue to watch CBC.  LOS: 21 days    Ravon Mcilhenny 08/20/2015

## 2015-08-21 ENCOUNTER — Inpatient Hospital Stay (HOSPITAL_COMMUNITY): Payer: Federal, State, Local not specified - PPO

## 2015-08-21 DIAGNOSIS — R509 Fever, unspecified: Secondary | ICD-10-CM

## 2015-08-21 LAB — CBC
HCT: 30.6 % — ABNORMAL LOW (ref 36.0–46.0)
HEMOGLOBIN: 9.8 g/dL — AB (ref 12.0–15.0)
MCH: 26.7 pg (ref 26.0–34.0)
MCHC: 32 g/dL (ref 30.0–36.0)
MCV: 83.4 fL (ref 78.0–100.0)
Platelets: 449 10*3/uL — ABNORMAL HIGH (ref 150–400)
RBC: 3.67 MIL/uL — ABNORMAL LOW (ref 3.87–5.11)
RDW: 15.8 % — AB (ref 11.5–15.5)
WBC: 12.7 10*3/uL — ABNORMAL HIGH (ref 4.0–10.5)

## 2015-08-21 LAB — URINALYSIS, ROUTINE W REFLEX MICROSCOPIC
BILIRUBIN URINE: NEGATIVE
Glucose, UA: NEGATIVE mg/dL
HGB URINE DIPSTICK: NEGATIVE
KETONES UR: NEGATIVE mg/dL
NITRITE: NEGATIVE
PH: 6 (ref 5.0–8.0)
Protein, ur: 100 mg/dL — AB
SPECIFIC GRAVITY, URINE: 1.035 — AB (ref 1.005–1.030)

## 2015-08-21 LAB — URINE MICROSCOPIC-ADD ON

## 2015-08-21 MED ORDER — SODIUM CHLORIDE 0.9 % IV SOLN
INTRAVENOUS | Status: DC
  Administered 2015-08-21 – 2015-08-23 (×4): via INTRAVENOUS

## 2015-08-21 MED ORDER — BOOST / RESOURCE BREEZE PO LIQD
1.0000 | Freq: Three times a day (TID) | ORAL | Status: DC
  Administered 2015-08-21 – 2015-08-27 (×19): 1 via ORAL

## 2015-08-21 MED ORDER — PIPERACILLIN-TAZOBACTAM 3.375 G IVPB
3.3750 g | Freq: Three times a day (TID) | INTRAVENOUS | Status: DC
  Administered 2015-08-21 – 2015-08-27 (×18): 3.375 g via INTRAVENOUS
  Filled 2015-08-21 (×21): qty 50

## 2015-08-21 NOTE — Progress Notes (Signed)
[facility] PROGRESS NOTE  Erin Santiago [id number] DOB: 05-31-1956 DOA: 07/30/2015 PCP: Merrilee Seashore, MD  Brief interval history  Erin Santiago is a 60 y.o. 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 Patient is status post chemotherapy and radiation to the pelvis and back and was due to start chemotherapy on the day of admission due to progression of disease involving the left abdominal pain. Presented to the ED with complaints of 1-2 day history of diffuse abdominal pain. CT abdomen and pelvis showed bowel perforation. Large 9 x 4.5 cm complex right lower quadrant and pelvic abscess containing a small amount of contrast suggesting communication with the distal ileum. general surgery consulted recommended initially nonoperative percutaneous drainage.She was placed on bowel rest and monitored. Interventional radiology was also consulted for drain placement. Patient had a percutaneous drain placed on 07/31/2015 and was placed empirically on IV Zosyn from admission. Wound/abscess cultures grew out Escherichia coli. Started on clears, Repeat CT abdomen and pelvis pending for 08/04/2015   HPI/Subjective: Patient sitting at bedside chair- states her nasal stuffiness is improving- has a sore area on her but - discussed measures to alleviate pressure- no nausea, abdominal pain or vomiting.   Assessment/Plan: #1 Bowel perforation, complex intra-abdominal fluid collection 9 x 4.5 cm right lower quadrant and pelvic -likely due to tumor invasion -Per CT abdomen and pelvis appears that perforation may have walled off and contained.  -S/p RLQ percutaneous drain 07/31/2015 per IR.  Wound/abscess cultures growing Escherichia coli. She has been on IV Zosyn.  -Gen. surgery and interventional radiology following. -On 08/11/2015 repeat CT scan of abdomen  and pelvis did not reveal significant change in large pelvic abscess per radiology, measuring 7 x 11.5 cm -3/8 underwent CT guided placed of a 10 Fr drainage catheter placement into the pelvis via the left lower abdomen yielding 220 cc of purulent material.  - onTNA  -Case was discussed with surgery who recommended monitoring patient off of antibiotics, and advancing her diet to see how she does. Plan to discharge her home this weekend with drain in place and have her follow-up with general surgery and the drain clinic. -On 08/20/2015 case discussed with general surgery, patient will require close follow up with GYN oncology at [facility]   #2 febrile illness -On 08/21/2015 patient reporting feeling poorly, found to have low-grade temperature 100.8. Labs reviewed all be trend in her white count to 12,700. -Chest x-ray did not reveal developing pneumonia. Urinalysis was unremarkable. -She was started on IV Zosyn. -I'm concerned about the possibility of this being related to intra-abdominal infection. -Await further recommendations from general surgery   #3 dehydration/metabolic acidosis -Resolving. She was treated with IV fluids.   #4 hypokalemia -Potassium improved   #5 lactic acidosis Likely secondary to problem #1 and dehydration.   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 Patient status post chemotherapy and radiation to the pelvis. Patient was to start chemotherapy on the day of admission due to progression of disease involving the left, iliac adenopathy, 2 masses in the right lower quadrant seen on CT scan on 07/21/2015.   -She had a repeat CT scan on 08/11/2015 that revealed mild progression of abdominal pelvic lymphadenopathy consistent with progression of metastatic disease. -Radiology also revealed increase in mild hydronephrosis due to  metastatic lymphadenopathy.  Renal function remains stable.  -Dr. Marko Plume of medical oncology following, overall prognosis is poor  prophylaxis SCDs for DVT prophylaxis.  Code Status: Full Family Communication: Updated patient. No family at bedside. Disposition Plan: Patient developing febrile illness on 08/21/2015, doubt will be stable for discharge over the weekend    Consultants:  Gen. surgery: Dr. Excell Seltzer 07/30/2015   oncology: Dr. Marko Plume 07/31/2015  Interventional radiology  Procedures:  CT abdomen and pelvis 07/30/2015  Acute abdominal series 07/30/2015  Right lower quadrant abdominal abscess drain with CT 07/31/2015 for Dr. Annamaria Boots  Antibiotics:  IV Zosyn 07/30/2015   Objective: Filed Vitals:   08/21/15 1033 08/21/15 1337  BP:    Pulse:    Temp: 100.8 F (38.2 C) 99.8 F (37.7 C)  Resp:      Intake/Output Summary (Last 24 hours) at 08/21/15 1431 Last data filed at 08/21/15 1340  Gross per 24 hour  Intake    255 ml  Output    115 ml  Net    140 ml   Filed Weights   08/07/15 0500 08/07/15 1536 08/17/15 0500  Weight: 97.07 kg (214 lb) 96.7 kg (213 lb 3 oz) 97.478 kg (214 lb 14.4 oz)    Exam:   General:   ill-appearing  cardiovascular: RRR  Respiratory: Normal respiratory effort, lungs are clear to auscultation bilaterally  Abdomen: Soft, nontender, nondistended. Drain in LLQ- there is mild erythema surrounding drain. Patient reporting pain to palpation over this region.   Musculoskeletal: No clubbing cyanosis or edema.   Data Reviewed: Basic Metabolic Panel:  Recent Labs Lab 08/16/15 0623 08/17/15 0532 08/20/15 0534  NA 142 139 138  K 4.2 4.3 3.5  CL 105 104 103  CO2 27 25 26   GLUCOSE 103* 107* 108*  BUN 19 16 15   CREATININE 0.53 0.60 0.58  CALCIUM 8.8* 9.0 8.8*  MG  --  2.0 1.9  PHOS  --  3.8 3.3   Liver Function Tests:  Recent Labs Lab 08/17/15 0532 08/20/15 0534  AST 27 19  ALT 25 21  ALKPHOS 184* 140*  BILITOT 0.7  0.6  PROT 6.9 7.1  ALBUMIN 2.8* 2.9*   No results for input(s): LIPASE, AMYLASE in the last 168 hours. No results for input(s): AMMONIA in the last 168 hours. CBC:  Recent Labs Lab 08/15/15 0612 08/16/15 0623 08/17/15 0532 08/21/15 0509  WBC 8.8 10.0 9.5 12.7*  NEUTROABS  --   --  8.0*  --   HGB 9.3* 8.9* 8.9* 9.8*  HCT 29.4* 28.4* 28.2* 30.6*  MCV 84.0 87.4 87.6 83.4  PLT 461* 413* 420* 449*   Cardiac Enzymes: No results for input(s): CKTOTAL, CKMB, CKMBINDEX, TROPONINI in the last 168 hours. BNP (last 3 results) No results for input(s): BNP in the last 8760 hours.  ProBNP (last 3 results) No results for input(s): PROBNP in the last 8760 hours.  CBG:  Recent Labs Lab 08/19/15 1155 08/19/15 1721 08/19/15 2329 08/20/15 0602 08/20/15 1159  GLUCAP 100* 51* 93 97 118*    Recent Results (from the past 240 hour(s))  Culture, routine-abscess     Status: None   Collection Time: 08/12/15  4:18 PM  Result Value Ref Range Status   Specimen Description DRAINAGE LEFT LOWER ABDOMEN  Final   Special Requests Normal  Final   Gram Stain   Final    ABUNDANT WBC PRESENT,BOTH PMN AND MONONUCLEAR NO SQUAMOUS EPITHELIAL CELLS SEEN ABUNDANT GRAM VARIABLE ROD Performed at [facility]  Culture   Final    FEW ESCHERICHIA COLI Performed at [facility]    Report Status 08/15/2015 FINAL  Final   Organism ID, Bacteria ESCHERICHIA COLI  Final      Susceptibility   Escherichia coli - MIC*    AMPICILLIN <=2 SENSITIVE Sensitive     AMPICILLIN/SULBACTAM <=2 SENSITIVE Sensitive     CEFEPIME <=1 SENSITIVE Sensitive     CEFTAZIDIME <=1 SENSITIVE Sensitive     CEFTRIAXONE <=1 SENSITIVE Sensitive     CIPROFLOXACIN <=0.25 SENSITIVE Sensitive     GENTAMICIN <=1 SENSITIVE Sensitive     IMIPENEM <=0.25 SENSITIVE Sensitive     PIP/TAZO <=4 SENSITIVE Sensitive     TOBRAMYCIN <=1 SENSITIVE Sensitive     TRIMETH/SULFA Value in next row Sensitive      <=20 SENSITIVE(NOTE)     * FEW ESCHERICHIA COLI     Studies: Dg Chest 2 View  08/21/2015  CLINICAL DATA:  Fever.  History of small bowel leak. EXAM: CHEST - 2 VIEW COMPARISON:  Abdominal series on 07/30/2015 FINDINGS: The heart size and mediastinal contours are within normal limits. Stable appearance of Port-A-Cath. Improved aeration at both lung bases with mild residual atelectasis remaining bilaterally. There is no evidence of pulmonary edema, consolidation, pneumothorax, nodule or pleural fluid. The visualized skeletal structures are unremarkable. IMPRESSION: Improved bilateral aeration with residual bibasilar atelectasis remaining. Electronically Signed   By: Aletta Edouard M.D.   On: 08/21/2015 14:21    Scheduled Meds: . acetaminophen  1,000 mg Oral TID  . antiseptic oral rinse  7 mL Mouth Rinse BID  . feeding supplement  1 Container Oral TID PC & HS  . feeding supplement (ENSURE ENLIVE)  237 mL Oral TID WC  . nystatin  5 mL Oral QID  . nystatin   Topical BID  . piperacillin-tazobactam (ZOSYN)  IV  3.375 g Intravenous 3 times per day  . saccharomyces boulardii  250 mg Oral BID  . sodium chloride flush  3 mL Intravenous Q12H   Continuous Infusions: . sodium chloride 75 mL/hr at 08/21/15 1032    Time spent: 25 mins    Kelvin Cellar, MD [facility] Pager [email or web address], password [facility] 08/21/2015, 2:31 PM  LOS: 22 days

## 2015-08-21 NOTE — Progress Notes (Signed)
  Subjective: She is complaining of more pain on the right side, she points to the drain but it is the right side.  Drainage is aback up, still about the same, reddish old blood appearing fluid.  She says she cannot eat, she thinks the TNA was causing diarrhea, because it has stopped with the TNA, being discontinued.    Objective: Vital signs in last 24 hours: Temp:  [98.6 F (37 C)-100.6 F (38.1 C)] 99.3 F (37.4 C) (03/17 0440) Pulse Rate:  [79-90] 79 (03/17 0440) Resp:  [18] 18 (03/17 0440) BP: (125-141)/(44-60) 125/59 mmHg (03/17 0440) SpO2:  [98 %-99 %] 98 % (03/17 0440) Last BM Date: 08/20/15 Drain 115 yesterday TM 100.6 last PM WBC is up  Off antibiotics day 3 Her CT was on 08/18/15. Intake/Output from previous day: 03/16 0701 - 03/17 0700 In: 508 [P.O.:480; I.V.:13] Out: 115 [Drains:115] Intake/Output this shift:    General appearance: alert, cooperative and no distress GI: soft, she is tender on the right side, perhaps more than before, the drain site looks fine. + BS, no peritonitis  Lab Results:   Recent Labs  08/21/15 0509  WBC 12.7*  HGB 9.8*  HCT 30.6*  PLT 449*    BMET  Recent Labs  08/20/15 0534  NA 138  K 3.5  CL 103  CO2 26  GLUCOSE 108*  BUN 15  CREATININE 0.58  CALCIUM 8.8*   PT/INR No results for input(s): LABPROT, INR in the last 72 hours.   Recent Labs Lab 08/17/15 0532 08/20/15 0534  AST 27 19  ALT 25 21  ALKPHOS 184* 140*  BILITOT 0.7 0.6  PROT 6.9 7.1  ALBUMIN 2.8* 2.9*     Lipase     Component Value Date/Time   LIPASE 18 07/30/2015 1251     Studies/Results: No results found.  Medications: . acetaminophen  1,000 mg Oral TID  . antiseptic oral rinse  7 mL Mouth Rinse BID  . feeding supplement  1 Container Oral TID BM  . feeding supplement (ENSURE ENLIVE)  237 mL Oral TID WC  . nystatin  5 mL Oral QID  . nystatin   Topical BID  . saccharomyces boulardii  250 mg Oral BID  . sodium chloride flush  3 mL  Intravenous Q12H    Assessment/Plan IR drain 07/31/15 - culture: E coli CT IMAGE GUIDED DRAINAGE BY PERCUTANEOUS CATHETER, 08/12/15, LEFT lower abdomen 220 ml drained; Dr. 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  She is having low grade temp last PM and her WBC is up to the highest point since her admit.    I will discuss with Dr. Marcello Moores.  I don't think we can let her go at this point, with WBC trending up.    LOS: 22 days    Erin Santiago 08/21/2015

## 2015-08-21 NOTE — Progress Notes (Signed)
Nutrition Note  Spoke with patient who had just returned from a chest x-ray. Patient reports that she has been running a fever and not feeling great for the past 24 hours.  States that yesterday afternoon she ordered a meal tray but did not eat anything when it arrived as she progressively felt worse as the day went on.  Reports that she consumed approximately 5 Boost Breeze's yesterday.  Patient continues to not feel great today but has been able to consume 3 Boost Breezes so far.  She says that she is unable to tolerate the Ensure supplements.  Pt expressed her frustration for not being able to advance her diet and consume some soft foods.  Dietetic Intern encouraged her to continue her good po intake of her Boost Breeze.   Will discontinue Ensure Enlive and continue [employer] supplement as ordered.   Erin Santiago, Dietetic Intern Pager: [phone]

## 2015-08-21 NOTE — Progress Notes (Signed)
MEDICAL ONCOLOGY August 21, 2015, 11:46 AM  Hospital day 23 Antibiotics: zosyn Chemotherapy:  planned adriamycin olaratumab on hold due to present complications   Subjective: Felt badly this AM with low grade fever, no shaking chills, more RLQ pain, still cough now productive of yellowish sputum. Not SOB with present activity. Diarrhea better today. No clear bladder symptoms. No bleeding, no LE swelling. Tolerating Boost Breeze and I have encouraged her to drink 5-6 daily if not taking other po's. Off of TNA. Has not felt well enough to stand at sink to brush teeth today.  Objective: Vital signs in last 24 hours: Blood pressure 125/59, pulse 79, temperature 99.3 F (37.4 C), temperature source Oral, resp. rate 18, height 5' (1.524 m), weight 214 lb 14.4 oz (97.478 kg), last menstrual period 02/04/2014, SpO2 98 %. Awake, alert, looks more uncomfortable and less well today. PERRL, oral mucosa moist and clear. PAC site ok. Lungs with slight crackles right base posteriorly, no wheeze or rales. Heart RRR. Abdomen obese, soft, few BS, LLQ drain with dark brown liquid. LE without pitting edema, cords, tenderness. Feet warm. Coughing during visit, sounds productive.  Intake/Output from previous day: 03/16 0701 - 03/17 0700 In: 508 [P.O.:480; I.V.:13] Out: 115 [Drains:115] Intake/Output this shift:    Physical exam:  Lab Results:  Recent Labs  08/21/15 0509  WBC 12.7*  HGB 9.8*  HCT 30.6*  PLT 449*   BMET  Recent Labs  08/20/15 0534  NA 138  K 3.5  CL 103  CO2 26  GLUCOSE 108*  BUN 15  CREATININE 0.58  CALCIUM 8.8*   UA, C & S pending  Studies/Results: CXR pending  DISCUSSION Patient is concerned that she is not well enough for DC today; I have told her that I understand that DC is on hold with fever and elevated WBC, and now back on antibiotics.  She would like to consider surgery for the bowel leak/ pelvic abscess, either with Dr Barry Dienes or possibly with general  surgery at [facility]. She understands that gyn oncology would not be correct to do this surgery, tho [name] and her partners may have recommendations for correct physician to do consultation if at [facility].   Assessment/Plan: 1. LLQ/ pelvic abscess in setting of progressive endometrial stromal sarcoma: IR drain in place, intermittent LLQ pain especially prior to bowel movements.  Patient would like consideration of surgical intervention here or [facility] in preference to continuing drainage at home if that is an option. Low grade fever this AM, not clear if pulmonary vs abdominal or possibly urinary source. Now on zosyn, CXR and urine pending. 2.Endometrial stromal sarcoma: surgery [facility] 07-14-14, progression by start of cycle 4 adjuvant gemzar taxotere in 10-2014. Radiation to symptomatic aortocaval and bilateral iliac adenopathy. Further progression recently including T4, post radiation there. Not eligible for any of the arms of MATCH trial. Plan had been to start adriamycin + olaratumab just at time of this complication, held with these acute problems, expect to begin outpatient when stable. Long term prognosis from the metastatic endometrial sarcoma is not good, however disease has not been rapidly progressive and patient has been very functional.  3.nutritional status: TNA DCd 08-20-15, likes Boost Breeze. She is concerned about trying to advance diet. 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 Hx nonmelanoma skin cancer. 9.multifactorial anemia stable, iron low normal and would be best to resume  po when able to take 10.Marland Kitchendeconditioned secondary to above: lives alone in [place], does have help with numerous animals  Please page me if I can help between my rounds   Clifford

## 2015-08-21 NOTE — Progress Notes (Signed)
Patient ID: Erin Santiago, female   DOB: 1955/11/10, 60 y.o.   MRN: [id number]    Referring Physician(s): Byerly.Dorris Fetch  Supervising Physician: Corrie Mckusick  Chief Complaint:  RLQ abdominal abscess  Subjective:  Pt a little more fatigued today; also c/o RLQ discomfort, occ nausea,?blood in urine  Allergies: Review of patient's allergies indicates no known allergies.  Medications: Prior to Admission medications   Medication Sig Start Date End Date Taking? Authorizing Provider  calcium carbonate (TUMS - DOSED IN MG ELEMENTAL CALCIUM) 500 MG chewable tablet Chew 2 tablets by mouth daily.    Yes Historical Provider, MD  dexamethasone (DECADRON) 4 MG tablet Take 2 tablets by mouth once a day on the day after chemotherapy and then take 2 tablets two times a day for 2 days. Take with food. 07/27/15  Yes Lennis Marion Downer, MD  docusate sodium (COLACE) 100 MG capsule Take 100 mg by mouth 2 (two) times daily as needed for mild constipation. Reported on 05/28/2015 07/17/14  Yes Historical Provider, MD  hydrocortisone 2.5 % cream Apply 1 application topically as needed (itch/psoriasis.). Reported on 07/17/2015 05/19/14  Yes Historical Provider, MD  lidocaine-prilocaine (EMLA) cream Apply to Porta-cath 1-2 hrs prior to access. Cover with [employer]. 08/18/14  Yes Lennis Marion Downer, MD  LORazepam (ATIVAN) 1 MG tablet Place 1/2 to 1 tablet under the tongue or swallow every 6 hours as needed for nausea 07/22/15  Yes Lennis P Livesay, MD  meloxicam (MOBIC) 15 MG tablet Take 15 mg by mouth daily.  06/09/14  Yes Historical Provider, MD  ondansetron (ZOFRAN) 8 MG tablet Take 1 tablet (8 mg total) by mouth every 8 (eight) hours as needed for nausea or vomiting. 07/22/15  Yes Lennis Marion Downer, MD  prochlorperazine (COMPAZINE) 10 MG tablet Take 1 tablet (10 mg total) by mouth every 6 (six) hours as needed (Nausea or vomiting). 07/27/15  Yes Lennis Marion Downer, MD  Psyllium 28.3 % POWD Take 1 packet by mouth daily as  needed (for fiber). Reported on 05/26/2015   Yes Historical Provider, MD  Resveratrol 250 MG CAPS Take 250 mg by mouth 2 (two) times daily. Reported on 05/26/2015   Yes Historical Provider, MD  ferrous fumarate (HEMOCYTE) 325 (106 FE) MG TABS tablet Take 1 tab daily on an empty stomach with OJ or Vitamin C 500 mg Patient not taking: Reported on 07/09/2015 09/15/14   Lennis Marion Downer, MD  sucralfate (CARAFATE) 1 GM/10ML suspension Take 10 mLs (1 g total) by mouth 4 (four) times daily -  with meals and at bedtime. Patient not taking: Reported on 07/30/2015 05/20/15   [name], MD     Vital Signs: BP 125/59 mmHg  Pulse 79  Temp(Src) 99.3 F (37.4 C) (Oral)  Resp 18  Ht 5' (1.524 m)  Wt 214 lb 14.4 oz (97.478 kg)  BMI 41.97 kg/m2  SpO2 98%  LMP 02/04/2014  Physical Exam RLQ drain intact with some mild erythema, induration/small amt fluid at insertion site; output 115 cc feculent fluid; drain irrigated with sterile NS without difficulty  Imaging: Ct Abdomen Pelvis W Contrast  08/18/2015  CLINICAL DATA:  Re-evaluate intra-abdominal abscess EXAM: CT ABDOMEN AND PELVIS WITH CONTRAST TECHNIQUE: Multidetector CT imaging of the abdomen and pelvis was performed using the standard protocol following bolus administration of intravenous contrast. CONTRAST:  140m OMNIPAQUE IOHEXOL 300 MG/ML  SOLN COMPARISON:  08/12/2015, 08/11/2015 FINDINGS: Lower chest: Mild bilateral lung base atelectasis. No pleural effusions identified. There is mitral valve calcification. Hepatobiliary:  Negative Pancreas: Pancreas is normal Spleen: Spleen is normal Adrenals/Urinary Tract: The adrenal glands are normal. Right kidney is normal. Mild left hydroureter again identified due to pelvic adenopathy. Dilatation of the left renal pelvis and ureter mildly less prominent when compared to 08/11/2015. Stomach/Bowel: Status post sleeve gastrectomy. Nonobstructive bowel gas pattern. Distal large bowel diverticulosis again identified.  Vascular/Lymphatic: No acute vascular abnormalities. Stable upper aortocaval 14 mm lymph node. Stable 43 x 34 mm partially necrotic lymph node mass in the aortocaval region at the level of the lower pole the kidneys. Stable 22 mm lymph node adjacent to the left common iliac artery. Stable 14 mm pelvic sidewall lymph node along the inner margin of the right acetabulum. Reproductive: Uterus is absent.  No pelvic masses. Other: Since 08/11/2015 a second pelvic drain has been placed. The previous right inferior anterior pelvic drain is again identified, and the air-fluid level containing structure associated with it has decreased in size significantly to about 3 cm in diameter. The left anterior pelvic drain, with tip extending obliquely into the right posterior pelvis, has been placed with significant reduction in the size of abscess to which it was targeted. This component of the abscess now measures only about 2 cm. In total the abscess previously measured 7 x 11 cm. The abscess is now difficult to measure as 1 abnormality as the anterior and posterior components described above no longer demonstrate obvious acute indication. Seen best on image number 68, just cephalad to the course of the initial right-sided pelvic drain, there is a persistent 4 cm abscess in the anterior right lower quadrant. This is unchanged. Musculoskeletal: There are no acute musculoskeletal findings IMPRESSION: Lobulated pelvic abscess previously identified is now markedly smaller, with unchanged position of previous right-sided drain and significant reduction in size of fluid collection associated with it, and placement of second left-sided pelvic drain with market reduction in the component of the abscess as well. A third, approximately 4 cm abscess component in the right lower quadrant anteriorly is unchanged. Electronically Signed   By: Skipper Cliche M.D.   On: 08/18/2015 12:49    Labs:  CBC:  Recent Labs  08/15/15 0612  08/16/15 0623 08/17/15 0532 08/21/15 0509  WBC 8.8 10.0 9.5 12.7*  HGB 9.3* 8.9* 8.9* 9.8*  HCT 29.4* 28.4* 28.2* 30.6*  PLT 461* 413* 420* 449*    COAGS:  Recent Labs  08/26/14 1300 07/30/15 2242 07/31/15 0428 08/12/15 0539  INR 0.95 1.49 1.41 1.35  APTT 27  --  32  --     BMP:  Recent Labs  08/13/15 0822 08/16/15 0623 08/17/15 0532 08/20/15 0534  NA 139 142 139 138  K 3.9 4.2 4.3 3.5  CL 104 105 104 103  CO2 '25 27 25 26  '$ GLUCOSE 113* 103* 107* 108*  BUN '18 19 16 15  '$ CALCIUM 8.8* 8.8* 9.0 8.8*  CREATININE 0.67 0.53 0.60 0.58  GFRNONAA >60 >60 >60 >60  GFRAA >60 >60 >60 >60    LIVER FUNCTION TESTS:  Recent Labs  08/10/15 0604 08/13/15 0822 08/17/15 0532 08/20/15 0534  BILITOT 1.3* 1.0 0.7 0.6  AST 21 13* 27 19  ALT 8* 11* 25 21  ALKPHOS 70 101 184* 140*  PROT 6.8 7.1 6.9 7.1  ALBUMIN 2.8* 2.7* 2.8* 2.9*    Assessment and Plan: Met endom ca; s/p drainage of RLQ abd abscess 2/24 ; temp 99.3; WBC up to 12.7; hgb stable; when irrigated as ordered drain appears to be working properly  with still sig output of feculent material; TRH to check UA, CXR; if output from drain decreases can consider upsizing but cont current drain for now- above d/w Dr. Earleen Newport; other plans as per [facility]   Electronically Signed: D. Rowe Robert 08/21/2015, 10:22 AM   I spent a total of 15 minutes at the the patient's bedside AND on the patient's hospital floor or unit, greater than 50% of which was counseling/coordinating care for RLQ abscess drain

## 2015-08-21 NOTE — Progress Notes (Signed)
PT Cancellation Note  Patient Details Name: Erin Santiago MRN: [id number] DOB: [birth year]   Cancelled Treatment:     pt declined "not today".   "My knees are giving me a fit".  Pt is amb self to and from bathroom as needed holding to her IV pole.     Nathanial Rancher 08/21/2015, 1:51 PM

## 2015-08-22 ENCOUNTER — Encounter (HOSPITAL_COMMUNITY): Payer: Self-pay | Admitting: Radiology

## 2015-08-22 ENCOUNTER — Inpatient Hospital Stay (HOSPITAL_COMMUNITY): Payer: Federal, State, Local not specified - PPO

## 2015-08-22 LAB — BASIC METABOLIC PANEL
Anion gap: 12 (ref 5–15)
BUN: 10 mg/dL (ref 6–20)
CO2: 23 mmol/L (ref 22–32)
Calcium: 8.4 mg/dL — ABNORMAL LOW (ref 8.9–10.3)
Chloride: 103 mmol/L (ref 101–111)
Creatinine, Ser: 0.56 mg/dL (ref 0.44–1.00)
GFR calc Af Amer: >60 mL/min (ref >60)
GLUCOSE: 127 mg/dL — AB (ref 65–99)
POTASSIUM: 3 mmol/L — AB (ref 3.5–5.1)
Sodium: 138 mmol/L (ref 135–145)

## 2015-08-22 LAB — CBC
HEMATOCRIT: 27.1 % — AB (ref 36.0–46.0)
Hemoglobin: 8.5 g/dL — ABNORMAL LOW (ref 12.0–15.0)
MCH: 27.3 pg (ref 26.0–34.0)
MCHC: 31.4 g/dL (ref 30.0–36.0)
MCV: 87.1 fL (ref 78.0–100.0)
PLATELETS: 419 10*3/uL — AB (ref 150–400)
RBC: 3.11 MIL/uL — AB (ref 3.87–5.11)
RDW: 16.2 % — ABNORMAL HIGH (ref 11.5–15.5)
WBC: 13.9 10*3/uL — ABNORMAL HIGH (ref 4.0–10.5)

## 2015-08-22 MED ORDER — POTASSIUM CHLORIDE 10 MEQ/100ML IV SOLN
10.0000 meq | INTRAVENOUS | Status: AC
Start: 2015-08-22 — End: 2015-08-22
  Administered 2015-08-22 (×2): 10 meq via INTRAVENOUS
  Filled 2015-08-22 (×2): qty 100

## 2015-08-22 MED ORDER — IOHEXOL 300 MG/ML  SOLN
50.0000 mL | Freq: Once | INTRAMUSCULAR | Status: AC | PRN
  Administered 2015-08-22: 50 mL via ORAL

## 2015-08-22 MED ORDER — IOHEXOL 300 MG/ML  SOLN
100.0000 mL | Freq: Once | INTRAMUSCULAR | Status: AC | PRN
  Administered 2015-08-22: 100 mL via INTRAVENOUS

## 2015-08-22 NOTE — Progress Notes (Signed)
Patient's repeat CT scan today has been reviewed with Dr. Anselm Pancoast.  She has a small undrained area around her drain on her CT scan, but unsure this is enough to cause her fevers and increasing WBC.  We will possibly plan for a drain injection on Monday +/- reposition of the drain.  Otherwise her CT scan is stable.  Erin Santiago E 1:22 PM 08/22/2015

## 2015-08-22 NOTE — Progress Notes (Signed)
[facility] PROGRESS NOTE  Erin Santiago [id number] DOB: [birth year] DOA: 07/30/2015 PCP: Merrilee Seashore, MD  Brief interval history  Erin Santiago is a 60 y.o. 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 Patient is status post chemotherapy and radiation to the pelvis and back and was due to start chemotherapy on the day of admission due to progression of disease involving the left abdominal pain. Presented to the ED with complaints of 1-2 day history of diffuse abdominal pain. CT abdomen and pelvis showed bowel perforation. Large 9 x 4.5 cm complex right lower quadrant and pelvic abscess containing a small amount of contrast suggesting communication with the distal ileum. general surgery consulted recommended initially nonoperative percutaneous drainage.She was placed on bowel rest and monitored. Interventional radiology was also consulted for drain placement. Patient had a percutaneous drain placed on 07/31/2015 and was placed empirically on IV Zosyn from admission. Wound/abscess cultures grew out Escherichia coli. Started on clears, Repeat CT abdomen and pelvis pending for 08/04/2015   HPI/Subjective: Patient sitting at bedside chair- states her nasal stuffiness is improving- has a sore area on her but - discussed measures to alleviate pressure- no nausea, abdominal pain or vomiting.   Assessment/Plan: #1 Bowel perforation, complex intra-abdominal fluid collection 9 x 4.5 cm right lower quadrant and pelvic -likely due to tumor invasion -Per CT abdomen and pelvis appears that perforation may have walled off and contained.  -S/p RLQ percutaneous drain 07/31/2015 per IR.  Wound/abscess cultures growing Escherichia coli. She has been on IV Zosyn.  -Gen. surgery and interventional radiology following. -On 08/11/2015 repeat CT scan of abdomen  and pelvis did not reveal significant change in large pelvic abscess per radiology, measuring 7 x 11.5 cm -3/8 underwent CT guided placed of a 10 Fr drainage catheter placement into the pelvis via the left lower abdomen yielding 220 cc of purulent material.  - onTNA  -Overnight miscarriage expected temperature 11.5. She was started on IV Zosyn on 08/21/2015 given fever and elevated white count. A repeat CT scan of abdomen and pelvis performed on 08/22/2015 reported by radiology to have a mildly increased size of a complex multiloculated right pelvic abscess.  #2 febrile illness -On 08/21/2015 patient reporting feeling poorly, found to have low-grade temperature 100.8. Labs reviewed all be trend in her white count to 12,700. -Chest x-ray did not reveal developing pneumonia. Urinalysis was unremarkable. -She was started on IV Zosyn. -RN reporting that patient has had several episodes of diarrhea, however, she states this started after drinking contrast. Having been IV antibiotic therapy I think its worth checking a stool for C diff    #3 dehydration/metabolic acidosis -Resolving. She was treated with IV fluids.   #4 hypokalemia -Potassium improved   #5 lactic acidosis Likely secondary to problem #1 and dehydration.   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 Patient status post chemotherapy and radiation to the pelvis. Patient was to start chemotherapy on the day of admission due to progression of disease involving the left, iliac adenopathy, 2 masses in the right lower quadrant seen on CT scan on 07/21/2015.   -She had a repeat CT scan on 08/11/2015 that revealed mild progression of abdominal pelvic lymphadenopathy consistent with progression of metastatic disease. -Radiology also revealed increase in mild hydronephrosis due to metastatic lymphadenopathy. Renal  function  remains stable.  -Dr. Marko Plume of medical oncology following, overall prognosis is poor  prophylaxis SCDs for DVT prophylaxis.  Code Status: Full Family Communication: Updated patient. No family at bedside. Disposition Plan: Patient developing febrile illness on 08/21/2015, doubt will be stable for discharge over the weekend    Consultants:  Gen. surgery: Dr. Excell Seltzer 07/30/2015   oncology: Dr. Marko Plume 07/31/2015  Interventional radiology  Procedures:  CT abdomen and pelvis 07/30/2015  Acute abdominal series 07/30/2015  Right lower quadrant abdominal abscess drain with CT 07/31/2015 for Dr. Annamaria Boots  Antibiotics:  IV Zosyn 07/30/2015   Objective: Filed Vitals:   08/21/15 2307 08/22/15 0559  BP:  122/59  Pulse:  94  Temp: 98.9 F (37.2 C) 99.7 F (37.6 C)  Resp:  20    Intake/Output Summary (Last 24 hours) at 08/22/15 1419 Last data filed at 08/21/15 2128  Gross per 24 hour  Intake    360 ml  Output     75 ml  Net    285 ml   Filed Weights   08/07/15 1536 08/17/15 0500 08/22/15 0650  Weight: 96.7 kg (213 lb 3 oz) 97.478 kg (214 lb 14.4 oz) 98.34 kg (216 lb 12.8 oz)    Exam:   General:   She seems a little better today compared to yesterday  cardiovascular: RRR  Respiratory: Normal respiratory effort, lungs are clear to auscultation bilaterally  Abdomen: Soft, nontender, nondistended. Drain in LLQ- there is mild erythema surrounding drain. Patient reporting pain to palpation over this region.   Musculoskeletal: No clubbing cyanosis or edema.   Data Reviewed: Basic Metabolic Panel:  Recent Labs Lab 08/16/15 0623 08/17/15 0532 08/20/15 0534 08/22/15 0355  NA 142 139 138 138  K 4.2 4.3 3.5 3.0*  CL 105 104 103 103  CO2 27 25 26 23   GLUCOSE 103* 107* 108* 127*  BUN 19 16 15 10   CREATININE 0.53 0.60 0.58 0.56  CALCIUM 8.8* 9.0 8.8* 8.4*  MG  --  2.0 1.9  --   PHOS  --  3.8 3.3  --    Liver Function Tests:  Recent Labs Lab  08/17/15 0532 08/20/15 0534  AST 27 19  ALT 25 21  ALKPHOS 184* 140*  BILITOT 0.7 0.6  PROT 6.9 7.1  ALBUMIN 2.8* 2.9*   No results for input(s): LIPASE, AMYLASE in the last 168 hours. No results for input(s): AMMONIA in the last 168 hours. CBC:  Recent Labs Lab 08/16/15 0623 08/17/15 0532 08/21/15 0509 08/22/15 0355  WBC 10.0 9.5 12.7* 13.9*  NEUTROABS  --  8.0*  --   --   HGB 8.9* 8.9* 9.8* 8.5*  HCT 28.4* 28.2* 30.6* 27.1*  MCV 87.4 87.6 83.4 87.1  PLT 413* 420* 449* 419*   Cardiac Enzymes: No results for input(s): CKTOTAL, CKMB, CKMBINDEX, TROPONINI in the last 168 hours. BNP (last 3 results) No results for input(s): BNP in the last 8760 hours.  ProBNP (last 3 results) No results for input(s): PROBNP in the last 8760 hours.  CBG:  Recent Labs Lab 08/19/15 1155 08/19/15 1721 08/19/15 2329 08/20/15 0602 08/20/15 1159  GLUCAP 100* 51* 93 97 118*    Recent Results (from the past 240 hour(s))  Culture, routine-abscess     Status: None   Collection Time: 08/12/15  4:18 PM  Result Value Ref Range Status   Specimen Description DRAINAGE LEFT LOWER ABDOMEN  Final   Special Requests Normal  Final   Gram Stain   Final  ABUNDANT WBC PRESENT,BOTH PMN AND MONONUCLEAR NO SQUAMOUS EPITHELIAL CELLS SEEN ABUNDANT GRAM VARIABLE ROD Performed at [facility]    Culture   Final    FEW ESCHERICHIA COLI Performed at [facility]    Report Status 08/15/2015 FINAL  Final   Organism ID, Bacteria ESCHERICHIA COLI  Final      Susceptibility   Escherichia coli - MIC*    AMPICILLIN <=2 SENSITIVE Sensitive     AMPICILLIN/SULBACTAM <=2 SENSITIVE Sensitive     CEFEPIME <=1 SENSITIVE Sensitive     CEFTAZIDIME <=1 SENSITIVE Sensitive     CEFTRIAXONE <=1 SENSITIVE Sensitive     CIPROFLOXACIN <=0.25 SENSITIVE Sensitive     GENTAMICIN <=1 SENSITIVE Sensitive     IMIPENEM <=0.25 SENSITIVE Sensitive     PIP/TAZO <=4 SENSITIVE Sensitive     TOBRAMYCIN <=1  SENSITIVE Sensitive     TRIMETH/SULFA Value in next row Sensitive      <=20 SENSITIVE(NOTE)    * FEW ESCHERICHIA COLI     Studies: Dg Chest 2 View  08/21/2015  CLINICAL DATA:  Fever.  History of small bowel leak. EXAM: CHEST - 2 VIEW COMPARISON:  Abdominal series on 07/30/2015 FINDINGS: The heart size and mediastinal contours are within normal limits. Stable appearance of Port-A-Cath. Improved aeration at both lung bases with mild residual atelectasis remaining bilaterally. There is no evidence of pulmonary edema, consolidation, pneumothorax, nodule or pleural fluid. The visualized skeletal structures are unremarkable. IMPRESSION: Improved bilateral aeration with residual bibasilar atelectasis remaining. Electronically Signed   By: Aletta Edouard M.D.   On: 08/21/2015 14:21   Ct Abdomen Pelvis W Contrast  08/22/2015  CLINICAL DATA:  60 year old female inpatient with metastatic endometrial sarcoma status post hysterectomy in February 2016 status post radiation therapy and chemotherapy, with progression of metastatic disease complicated by bowel perforation requiring percutaneous drainage x 2. Low-grade fever. Persistent fluid draining from percutaneous tubes. EXAM: CT ABDOMEN AND PELVIS WITH CONTRAST TECHNIQUE: Multidetector CT imaging of the abdomen and pelvis was performed using the standard protocol following bolus administration of intravenous contrast. CONTRAST:  56mL OMNIPAQUE IOHEXOL 300 MG/ML SOLN, 149mL OMNIPAQUE IOHEXOL 300 MG/ML SOLN COMPARISON:  08/18/2015 CT abdomen/pelvis . FINDINGS: Lower chest: Peripheral left upper lobe 3 mm pulmonary nodule (series 4/image 3) is stable. Mild bibasilar atelectasis. Partially visualized is the tip of a superior approach central venous catheter in the lower third of the superior vena cava. Hepatobiliary: Normal liver with no liver mass. Normal gallbladder with no radiopaque cholelithiasis. No biliary ductal dilatation. Pancreas: Normal, with no mass or duct  dilation. Spleen: Normal size. No mass. Adrenals/Urinary Tract: Normal adrenals. No right hydronephrosis. Minimal left hydronephrosis, decreased. No renal mass. Collapsed and grossly normal bladder. Stomach/Bowel: Stable postsurgical changes in the stomach from prior sleeve surgery, with no gross gastric abnormality. No significantly dilated small bowel loops. Oral contrast progresses to the colon. No focal small bowel caliber transition. Reactive wall thickening within several distal ileal small bowel loops in the right lower quadrant. Normal appendix. Minimal diverticulosis in the sigmoid colon, with no large bowel wall thickening. Vascular/Lymphatic: Normal caliber abdominal aorta. Patent portal, splenic, hepatic and renal veins. Enlarged 3.7 cm aortocaval node (series 7/ image 55), previously 3.5 cm, slightly increased, with possible invasion of the third portion of the duodenum by this node. Enlarged clustered left common iliac nodes, largest 2.2 cm, not appreciably changed. Enlarged 2.3 cm irregular left internal iliac node (series 2/ image 81), not appreciably changed. Enlarged 1.9 cm right external  iliac node (series 2/image 70), not appreciably changed. Reproductive: Status post hysterectomy.  No adnexal mass. Other: Interval removal of the left ventral pelvic percutaneous drainage catheter. Tiny residual thick walled 1.8 x 1.6 cm collection at the right vaginal cuff margin (series 2/ image 83), previously 1.9 x 1.7 cm, not appreciably changed. Stable position of ventral right pelvic percutaneous drainage catheter with the distal pigtail tip within the right anterior pelvis. There is a complex multilocular gas containing thick walled abscess in the right lower quadrant, with a dominant 5.6 x 4.8 cm block ill superior to the drainage catheter (series 2/ image 73), previously 4.9 x 4.3 cm, mildly increased and a 4.6 x 4.0 cm o'clock ill posterior/ inferior to the drainage catheter tip (series 2/ image 79),  previously 3.4 x 2.5 cm, mildly increased. No oral contrast is seen within the pelvic fluid collections. No free intraperitoneal air. No new fluid collections. Musculoskeletal: No aggressive appearing focal osseous lesions. Moderate degenerative changes in the visualized thoracolumbar spine. IMPRESSION: 1. Mildly increased size of complex multilocular right pelvic abscess as described. 2. Tiny residual abscess at the right vaginal cuff at the previous site of the removed left percutaneous drainage catheter, unchanged. 3. Stable to slightly increased retroperitoneal and bilateral pelvic nodal metastases. 4. Minimal left hydronephrosis, decreased, secondary to infiltrate left pelvic side wall adenopathy. 5. No evidence of bowel obstruction. Mild reactive wall thickening in the distal small bowel. No oral contrast accumulates within the pelvic fluid collections. Electronically Signed   By: Ilona Sorrel M.D.   On: 08/22/2015 12:07    Scheduled Meds: . acetaminophen  1,000 mg Oral TID  . antiseptic oral rinse  7 mL Mouth Rinse BID  . feeding supplement  1 Container Oral TID PC & HS  . nystatin  5 mL Oral QID  . nystatin   Topical BID  . piperacillin-tazobactam (ZOSYN)  IV  3.375 g Intravenous 3 times per day  . saccharomyces boulardii  250 mg Oral BID  . sodium chloride flush  3 mL Intravenous Q12H   Continuous Infusions: . sodium chloride 75 mL/hr at 08/22/15 1048    Time spent: 25 mins    Kelvin Cellar, MD [facility] Pager [email or web address], password [email or web address] 08/22/2015, 2:19 PM  LOS: 23 days

## 2015-08-22 NOTE — Progress Notes (Signed)
Subjective: She is complaining of more pain on the right side.  Drainage is about the same.  Having diarrhea  Objective: Vital signs in last 24 hours: Temp:  [98.9 F (37.2 C)-101.5 F (38.6 C)] 99.7 F (37.6 C) (03/18 0559) Pulse Rate:  [84-94] 94 (03/18 0559) Resp:  [18-20] 20 (03/18 0559) BP: (122-130)/(55-59) 122/59 mmHg (03/18 0559) SpO2:  [96 %-98 %] 97 % (03/18 0559) Weight:  [98.34 kg (216 lb 12.8 oz)] 98.34 kg (216 lb 12.8 oz) (03/18 0650) Last BM Date: 08/20/15 (see LDA) Drain 115 yesterday TM 100.6 last PM WBC is up  Off antibiotics day 3 Her CT was on 08/18/15. Intake/Output from previous day: 03/17 0701 - 03/18 0700 In: 645 [P.O.:360; I.V.:235; IV Piggyback:50] Out: 75 [Drains:75] Intake/Output this shift:    General appearance: alert, cooperative and no distress GI: soft, she is tender on the right side, perhaps more than before, the drain site looks fine. + BS, no peritonitis  Lab Results:   Recent Labs  08/21/15 0509 08/22/15 0355  WBC 12.7* 13.9*  HGB 9.8* 8.5*  HCT 30.6* 27.1*  PLT 449* 419*    BMET  Recent Labs  08/20/15 0534 08/22/15 0355  NA 138 138  K 3.5 3.0*  CL 103 103  CO2 26 23  GLUCOSE 108* 127*  BUN 15 10  CREATININE 0.58 0.56  CALCIUM 8.8* 8.4*   PT/INR No results for input(s): LABPROT, INR in the last 72 hours.   Recent Labs Lab 08/17/15 0532 08/20/15 0534  AST 27 19  ALT 25 21  ALKPHOS 184* 140*  BILITOT 0.7 0.6  PROT 6.9 7.1  ALBUMIN 2.8* 2.9*     Lipase     Component Value Date/Time   LIPASE 18 07/30/2015 1251     Studies/Results: Dg Chest 2 View  08/21/2015  CLINICAL DATA:  Fever.  History of small bowel leak. EXAM: CHEST - 2 VIEW COMPARISON:  Abdominal series on 07/30/2015 FINDINGS: The heart size and mediastinal contours are within normal limits. Stable appearance of Port-A-Cath. Improved aeration at both lung bases with mild residual atelectasis remaining bilaterally. There is no evidence of  pulmonary edema, consolidation, pneumothorax, nodule or pleural fluid. The visualized skeletal structures are unremarkable. IMPRESSION: Improved bilateral aeration with residual bibasilar atelectasis remaining. Electronically Signed   By: Aletta Edouard M.D.   On: 08/21/2015 14:21    Medications: . acetaminophen  1,000 mg Oral TID  . antiseptic oral rinse  7 mL Mouth Rinse BID  . feeding supplement  1 Container Oral TID PC & HS  . nystatin  5 mL Oral QID  . nystatin   Topical BID  . piperacillin-tazobactam (ZOSYN)  IV  3.375 g Intravenous 3 times per day  . potassium chloride  10 mEq Intravenous Q1 Hr x 2  . saccharomyces boulardii  250 mg Oral BID  . sodium chloride flush  3 mL Intravenous Q12H    Assessment/Plan IR drain 07/31/15 - culture: E coli CT IMAGE GUIDED DRAINAGE BY PERCUTANEOUS CATHETER, 08/12/15, LEFT lower abdomen 220 ml drained; Dr. Sandi Mariscal LLQ drain removed after CT 08/18/15 Endometrial sarcoma,with bilateral pelvic node involvement/spinal & intraabdominal metastasis Hx of hysterectomy/nephrectomy [facility] 2016 -- chemo/radiation therapy/[facility] Vivien Rossetti, MD  Hx of gastric sleeve  Anemia secondary to chronic blood loss Body mass index is 41.6 malnutrition on TNA Antibiotics: Zosyn restarted DVT: SCD's added     Plan;  She complains of more pain, unable to eat well, drainage is about  the same.  She is having fevers and her WBC is going up.  CT Abd is ordered.  If the CT looks ok, consider C diff testing   LOS: 23 days    Nazifa Trinka C. [id number]

## 2015-08-23 LAB — BASIC METABOLIC PANEL
ANION GAP: 10 (ref 5–15)
BUN: 8 mg/dL (ref 6–20)
CHLORIDE: 107 mmol/L (ref 101–111)
CO2: 23 mmol/L (ref 22–32)
Calcium: 8.5 mg/dL — ABNORMAL LOW (ref 8.9–10.3)
Creatinine, Ser: 0.46 mg/dL (ref 0.44–1.00)
GFR calc non Af Amer: >60 mL/min (ref >60)
Glucose, Bld: 162 mg/dL — ABNORMAL HIGH (ref 65–99)
POTASSIUM: 3.1 mmol/L — AB (ref 3.5–5.1)
SODIUM: 140 mmol/L (ref 135–145)

## 2015-08-23 LAB — CBC
HEMATOCRIT: 27.3 % — AB (ref 36.0–46.0)
HEMOGLOBIN: 8.7 g/dL — AB (ref 12.0–15.0)
MCH: 27.5 pg (ref 26.0–34.0)
MCHC: 31.9 g/dL (ref 30.0–36.0)
MCV: 86.4 fL (ref 78.0–100.0)
Platelets: 443 10*3/uL — ABNORMAL HIGH (ref 150–400)
RBC: 3.16 MIL/uL — AB (ref 3.87–5.11)
RDW: 15.8 % — ABNORMAL HIGH (ref 11.5–15.5)
WBC: 12.6 10*3/uL — AB (ref 4.0–10.5)

## 2015-08-23 LAB — C DIFFICILE QUICK SCREEN W PCR REFLEX
C DIFFICILE (CDIFF) INTERP: NEGATIVE
C DIFFICILE (CDIFF) TOXIN: NEGATIVE
C Diff antigen: NEGATIVE

## 2015-08-23 MED ORDER — FLUTICASONE PROPIONATE 50 MCG/ACT NA SUSP
1.0000 | Freq: Every day | NASAL | Status: DC
  Administered 2015-08-23 – 2015-08-24 (×2): 1 via NASAL
  Filled 2015-08-23: qty 16

## 2015-08-23 MED ORDER — PROMETHAZINE HCL 25 MG/ML IJ SOLN: 12.5000 mg | Freq: Once | INTRAMUSCULAR | Status: DC

## 2015-08-23 MED ORDER — IBUPROFEN 400 MG PO TABS
400.0000 mg | ORAL_TABLET | Freq: Once | ORAL | Status: AC
  Administered 2015-08-23: 400 mg via ORAL
  Filled 2015-08-23: qty 2
  Filled 2015-08-23: qty 1

## 2015-08-23 NOTE — Progress Notes (Signed)
[facility] PROGRESS NOTE  Erin Santiago [id number] DOB: January 25, 1956 DOA: 07/30/2015 PCP: Merrilee Seashore, MD  Brief interval history  Erin Santiago is a 60 y.o. 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 Patient is status post chemotherapy and radiation to the pelvis and back and was due to start chemotherapy on the day of admission due to progression of disease involving the left abdominal pain. Presented to the ED with complaints of 1-2 day history of diffuse abdominal pain. CT abdomen and pelvis showed bowel perforation. Large 9 x 4.5 cm complex right lower quadrant and pelvic abscess containing a small amount of contrast suggesting communication with the distal ileum. general surgery consulted recommended initially nonoperative percutaneous drainage.She was placed on bowel rest and monitored. Interventional radiology was also consulted for drain placement. Patient had a percutaneous drain placed on 07/31/2015 and was placed empirically on IV Zosyn from admission. Wound/abscess cultures grew out Escherichia coli. Started on clears, Repeat CT abdomen and pelvis pending for 08/04/2015   HPI/Subjective: Patient sitting at bedside chair- states her nasal stuffiness is improving- has a sore area on her but - discussed measures to alleviate pressure- no nausea, abdominal pain or vomiting.   Assessment/Plan: #1 Bowel perforation, complex intra-abdominal fluid collection 9 x 4.5 cm right lower quadrant and pelvic -likely due to tumor invasion -Per CT abdomen and pelvis appears that perforation may have walled off and contained.  -S/p RLQ percutaneous drain 07/31/2015 per IR.  Wound/abscess cultures growing Escherichia coli. She has been on IV Zosyn.  -Gen. surgery and interventional radiology following. -On 08/11/2015 repeat CT scan of abdomen  and pelvis did not reveal significant change in large pelvic abscess per radiology, measuring 7 x 11.5 cm -3/8 underwent CT guided placed of a 10 Fr drainage catheter placement into the pelvis via the left lower abdomen yielding 220 cc of purulent material.  - onTNA  -Overnight miscarriage expected temperature 11.5. She was started on IV Zosyn on 08/21/2015 given fever and elevated white count. A repeat CT scan of abdomen and pelvis performed on 08/22/2015 reported by radiology to have a mildly increased size of a complex multiloculated right pelvic abscess. -On 08/22/2015 she reports feeling better, though spiked a fever of 101.9 at 4:40 am. She remains on zosyn  -Still not tolerating PO  #2 febrile illness -On 08/21/2015 patient reporting feeling poorly, found to have low-grade temperature 100.8. Labs reviewed all be trend in her white count to 12,700. -Chest x-ray did not reveal developing pneumonia. Urinalysis was unremarkable. -She was started on IV Zosyn. -RN reporting that patient has had several episodes of diarrhea, however, she states this started after drinking contrast.   #3 dehydration/metabolic acidosis -Resolving. She was treated with IV fluids.   #4 hypokalemia -Potassium improved   #5 lactic acidosis Likely secondary to problem #1 and dehydration.   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 Patient status post chemotherapy and radiation to the pelvis. Patient was to start chemotherapy on the day of admission due to progression of disease involving the left, iliac adenopathy, 2 masses in the right lower quadrant seen on CT scan on 07/21/2015.   -She had a repeat CT scan on 08/11/2015 that revealed mild progression of abdominal pelvic lymphadenopathy consistent with progression of metastatic disease. -Radiology also revealed increase in mild  hydronephrosis due to metastatic lymphadenopathy. Renal function remains stable.  -Dr. Marko Plume of medical oncology following, overall prognosis is poor  prophylaxis SCDs for DVT prophylaxis.  Code Status: Full Family Communication: Updated patient. No family at bedside. Disposition Plan: Patient developing febrile illness on 08/21/2015, doubt will be stable for discharge over the weekend    Consultants:  Gen. surgery: Dr. Excell Seltzer 07/30/2015   oncology: Dr. Marko Plume 07/31/2015  Interventional radiology  Procedures:  CT abdomen and pelvis 07/30/2015  Acute abdominal series 07/30/2015  Right lower quadrant abdominal abscess drain with CT 07/31/2015 for Dr. Annamaria Boots  Antibiotics:  IV Zosyn 07/30/2015   Objective: Filed Vitals:   08/23/15 0814 08/23/15 1435  BP: 112/56 95/59  Pulse: 88 76  Temp: 99 F (37.2 C) 97.4 F (36.3 C)  Resp: 16 16    Intake/Output Summary (Last 24 hours) at 08/23/15 1601 Last data filed at 08/23/15 0819  Gross per 24 hour  Intake 1987.5 ml  Output    121 ml  Net 1866.5 ml   Filed Weights   08/17/15 0500 08/22/15 0650 08/23/15 0602  Weight: 97.478 kg (214 lb 14.4 oz) 98.34 kg (216 lb 12.8 oz) 98.34 kg (216 lb 12.8 oz)    Exam:   General:   She seems a little better today compared to yesterday  cardiovascular: RRR  Respiratory: Normal respiratory effort, lungs are clear to auscultation bilaterally  Abdomen: Soft, nontender, nondistended. Drain in LLQ- there is mild erythema surrounding drain. Patient reporting pain to palpation over this region.   Musculoskeletal: No clubbing cyanosis or edema.   Data Reviewed: Basic Metabolic Panel:  Recent Labs Lab 08/17/15 0532 08/20/15 0534 08/22/15 0355 08/23/15 0430  NA 139 138 138 140  K 4.3 3.5 3.0* 3.1*  CL 104 103 103 107  CO2 25 26 23 23   GLUCOSE 107* 108* 127* 162*  BUN 16 15 10 8   CREATININE 0.60 0.58 0.56 0.46  CALCIUM 9.0 8.8* 8.4* 8.5*  MG 2.0 1.9  --   --   PHOS 3.8  3.3  --   --    Liver Function Tests:  Recent Labs Lab 08/17/15 0532 08/20/15 0534  AST 27 19  ALT 25 21  ALKPHOS 184* 140*  BILITOT 0.7 0.6  PROT 6.9 7.1  ALBUMIN 2.8* 2.9*   No results for input(s): LIPASE, AMYLASE in the last 168 hours. No results for input(s): AMMONIA in the last 168 hours. CBC:  Recent Labs Lab 08/17/15 0532 08/21/15 0509 08/22/15 0355 08/23/15 0430  WBC 9.5 12.7* 13.9* 12.6*  NEUTROABS 8.0*  --   --   --   HGB 8.9* 9.8* 8.5* 8.7*  HCT 28.2* 30.6* 27.1* 27.3*  MCV 87.6 83.4 87.1 86.4  PLT 420* 449* 419* 443*   Cardiac Enzymes: No results for input(s): CKTOTAL, CKMB, CKMBINDEX, TROPONINI in the last 168 hours. BNP (last 3 results) No results for input(s): BNP in the last 8760 hours.  ProBNP (last 3 results) No results for input(s): PROBNP in the last 8760 hours.  CBG:  Recent Labs Lab 08/19/15 1155 08/19/15 1721 08/19/15 2329 08/20/15 0602 08/20/15 1159  GLUCAP 100* 51* 93 97 118*    Recent Results (from the past 240 hour(s))  C difficile quick scan w PCR reflex     Status: None   Collection Time: 08/22/15 10:14 PM  Result Value Ref Range Status   C Diff antigen NEGATIVE NEGATIVE Final   C Diff toxin NEGATIVE NEGATIVE Final   C Diff interpretation  Negative for toxigenic C. difficile  Final     Studies: Ct Abdomen Pelvis W Contrast  08/22/2015  CLINICAL DATA:  60 year old female inpatient with metastatic endometrial sarcoma status post hysterectomy in February 2016 status post radiation therapy and chemotherapy, with progression of metastatic disease complicated by bowel perforation requiring percutaneous drainage x 2. Low-grade fever. Persistent fluid draining from percutaneous tubes. EXAM: CT ABDOMEN AND PELVIS WITH CONTRAST TECHNIQUE: Multidetector CT imaging of the abdomen and pelvis was performed using the standard protocol following bolus administration of intravenous contrast. CONTRAST:  43mL OMNIPAQUE IOHEXOL 300 MG/ML SOLN,  171mL OMNIPAQUE IOHEXOL 300 MG/ML SOLN COMPARISON:  08/18/2015 CT abdomen/pelvis . FINDINGS: Lower chest: Peripheral left upper lobe 3 mm pulmonary nodule (series 4/image 3) is stable. Mild bibasilar atelectasis. Partially visualized is the tip of a superior approach central venous catheter in the lower third of the superior vena cava. Hepatobiliary: Normal liver with no liver mass. Normal gallbladder with no radiopaque cholelithiasis. No biliary ductal dilatation. Pancreas: Normal, with no mass or duct dilation. Spleen: Normal size. No mass. Adrenals/Urinary Tract: Normal adrenals. No right hydronephrosis. Minimal left hydronephrosis, decreased. No renal mass. Collapsed and grossly normal bladder. Stomach/Bowel: Stable postsurgical changes in the stomach from prior sleeve surgery, with no gross gastric abnormality. No significantly dilated small bowel loops. Oral contrast progresses to the colon. No focal small bowel caliber transition. Reactive wall thickening within several distal ileal small bowel loops in the right lower quadrant. Normal appendix. Minimal diverticulosis in the sigmoid colon, with no large bowel wall thickening. Vascular/Lymphatic: Normal caliber abdominal aorta. Patent portal, splenic, hepatic and renal veins. Enlarged 3.7 cm aortocaval node (series 7/ image 55), previously 3.5 cm, slightly increased, with possible invasion of the third portion of the duodenum by this node. Enlarged clustered left common iliac nodes, largest 2.2 cm, not appreciably changed. Enlarged 2.3 cm irregular left internal iliac node (series 2/ image 81), not appreciably changed. Enlarged 1.9 cm right external iliac node (series 2/image 70), not appreciably changed. Reproductive: Status post hysterectomy.  No adnexal mass. Other: Interval removal of the left ventral pelvic percutaneous drainage catheter. Tiny residual thick walled 1.8 x 1.6 cm collection at the right vaginal cuff margin (series 2/ image 83), previously  1.9 x 1.7 cm, not appreciably changed. Stable position of ventral right pelvic percutaneous drainage catheter with the distal pigtail tip within the right anterior pelvis. There is a complex multilocular gas containing thick walled abscess in the right lower quadrant, with a dominant 5.6 x 4.8 cm block ill superior to the drainage catheter (series 2/ image 73), previously 4.9 x 4.3 cm, mildly increased and a 4.6 x 4.0 cm o'clock ill posterior/ inferior to the drainage catheter tip (series 2/ image 79), previously 3.4 x 2.5 cm, mildly increased. No oral contrast is seen within the pelvic fluid collections. No free intraperitoneal air. No new fluid collections. Musculoskeletal: No aggressive appearing focal osseous lesions. Moderate degenerative changes in the visualized thoracolumbar spine. IMPRESSION: 1. Mildly increased size of complex multilocular right pelvic abscess as described. 2. Tiny residual abscess at the right vaginal cuff at the previous site of the removed left percutaneous drainage catheter, unchanged. 3. Stable to slightly increased retroperitoneal and bilateral pelvic nodal metastases. 4. Minimal left hydronephrosis, decreased, secondary to infiltrate left pelvic side wall adenopathy. 5. No evidence of bowel obstruction. Mild reactive wall thickening in the distal small bowel. No oral contrast accumulates within the pelvic fluid collections. Electronically Signed   By: Janina Mayo.D.  On: 08/22/2015 12:07    Scheduled Meds: . acetaminophen  1,000 mg Oral TID  . antiseptic oral rinse  7 mL Mouth Rinse BID  . feeding supplement  1 Container Oral TID PC & HS  . nystatin  5 mL Oral QID  . nystatin   Topical BID  . piperacillin-tazobactam (ZOSYN)  IV  3.375 g Intravenous 3 times per day  . promethazine  12.5 mg Intravenous Once  . saccharomyces boulardii  250 mg Oral BID  . sodium chloride flush  3 mL Intravenous Q12H   Continuous Infusions: . sodium chloride 75 mL/hr at 08/23/15 1339     Time spent: 15 mins    Kelvin Cellar, MD [facility] Pager [email or web address], password [facility] 08/23/2015, 4:01 PM  LOS: 24 days

## 2015-08-23 NOTE — Progress Notes (Signed)
RLQ drain being dressed with split gauze related to drainage around insertion site. Dressing needing to be changed every shift due to saturation of brown/red, foul smelling drainage. Also, the insertion site is edematous and reddened. Pt is concerned.

## 2015-08-23 NOTE — Progress Notes (Signed)
Patient ID: Erin Santiago, female   DOB: [birth year], 60 y.o.   MRN: [id number]    Subjective: Overall feeling a little better today than yesterday. Did have some fever last night.  Some discomfort around the drain site but no severe abdominal pain.  She seems to feel worse when trying to eat.  Objective: Vital signs in last 24 hours: Temp:  [98.3 F (36.8 C)-101.9 F (38.8 C)] 99 F (37.2 C) (03/19 0814) Pulse Rate:  [82-96] 88 (03/19 0814) Resp:  [16-20] 16 (03/19 0814) BP: (112-126)/(56-58) 112/56 mmHg (03/19 0814) SpO2:  [96 %-100 %] 97 % (03/19 0814) Weight:  [98.34 kg (216 lb 12.8 oz)] 98.34 kg (216 lb 12.8 oz) (03/19 0602) Last BM Date: 08/22/15  Intake/Output from previous day: 03/18 0701 - 03/19 0700 In: 1987.5 [P.O.:120; I.V.:1862.5] Out: 122 [Emesis/NG output:2; Drains:120] Intake/Output this shift:    General appearance: alert, cooperative and no distress GI: nondistended and minimal tenderness around the drain. Incision/Wound: drain site with mild erythema. Reddish brown, probable feculent drainage and catheter.  Lab Results:   Recent Labs  08/22/15 0355 08/23/15 0430  WBC 13.9* 12.6*  HGB 8.5* 8.7*  HCT 27.1* 27.3*  PLT 419* 443*   BMET  Recent Labs  08/22/15 0355 08/23/15 0430  NA 138 140  K 3.0* 3.1*  CL 103 107  CO2 23 23  GLUCOSE 127* 162*  BUN 10 8  CREATININE 0.56 0.46  CALCIUM 8.4* 8.5*     Studies/Results: Dg Chest 2 View  08/21/2015  CLINICAL DATA:  Fever.  History of small bowel leak. EXAM: CHEST - 2 VIEW COMPARISON:  Abdominal series on 07/30/2015 FINDINGS: The heart size and mediastinal contours are within normal limits. Stable appearance of Port-A-Cath. Improved aeration at both lung bases with mild residual atelectasis remaining bilaterally. There is no evidence of pulmonary edema, consolidation, pneumothorax, nodule or pleural fluid. The visualized skeletal structures are unremarkable. IMPRESSION: Improved bilateral aeration with  residual bibasilar atelectasis remaining. Electronically Signed   By: Aletta Edouard M.D.   On: 08/21/2015 14:21   Ct Abdomen Pelvis W Contrast  08/22/2015  CLINICAL DATA:  60 year old female inpatient with metastatic endometrial sarcoma status post hysterectomy in February 2016 status post radiation therapy and chemotherapy, with progression of metastatic disease complicated by bowel perforation requiring percutaneous drainage x 2. Low-grade fever. Persistent fluid draining from percutaneous tubes. EXAM: CT ABDOMEN AND PELVIS WITH CONTRAST TECHNIQUE: Multidetector CT imaging of the abdomen and pelvis was performed using the standard protocol following bolus administration of intravenous contrast. CONTRAST:  26mL OMNIPAQUE IOHEXOL 300 MG/ML SOLN, 188mL OMNIPAQUE IOHEXOL 300 MG/ML SOLN COMPARISON:  08/18/2015 CT abdomen/pelvis . FINDINGS: Lower chest: Peripheral left upper lobe 3 mm pulmonary nodule (series 4/image 3) is stable. Mild bibasilar atelectasis. Partially visualized is the tip of a superior approach central venous catheter in the lower third of the superior vena cava. Hepatobiliary: Normal liver with no liver mass. Normal gallbladder with no radiopaque cholelithiasis. No biliary ductal dilatation. Pancreas: Normal, with no mass or duct dilation. Spleen: Normal size. No mass. Adrenals/Urinary Tract: Normal adrenals. No right hydronephrosis. Minimal left hydronephrosis, decreased. No renal mass. Collapsed and grossly normal bladder. Stomach/Bowel: Stable postsurgical changes in the stomach from prior sleeve surgery, with no gross gastric abnormality. No significantly dilated small bowel loops. Oral contrast progresses to the colon. No focal small bowel caliber transition. Reactive wall thickening within several distal ileal small bowel loops in the right lower quadrant. Normal appendix. Minimal diverticulosis in the sigmoid  colon, with no large bowel wall thickening. Vascular/Lymphatic: Normal caliber  abdominal aorta. Patent portal, splenic, hepatic and renal veins. Enlarged 3.7 cm aortocaval node (series 7/ image 55), previously 3.5 cm, slightly increased, with possible invasion of the third portion of the duodenum by this node. Enlarged clustered left common iliac nodes, largest 2.2 cm, not appreciably changed. Enlarged 2.3 cm irregular left internal iliac node (series 2/ image 81), not appreciably changed. Enlarged 1.9 cm right external iliac node (series 2/image 70), not appreciably changed. Reproductive: Status post hysterectomy.  No adnexal mass. Other: Interval removal of the left ventral pelvic percutaneous drainage catheter. Tiny residual thick walled 1.8 x 1.6 cm collection at the right vaginal cuff margin (series 2/ image 83), previously 1.9 x 1.7 cm, not appreciably changed. Stable position of ventral right pelvic percutaneous drainage catheter with the distal pigtail tip within the right anterior pelvis. There is a complex multilocular gas containing thick walled abscess in the right lower quadrant, with a dominant 5.6 x 4.8 cm block ill superior to the drainage catheter (series 2/ image 73), previously 4.9 x 4.3 cm, mildly increased and a 4.6 x 4.0 cm o'clock ill posterior/ inferior to the drainage catheter tip (series 2/ image 79), previously 3.4 x 2.5 cm, mildly increased. No oral contrast is seen within the pelvic fluid collections. No free intraperitoneal air. No new fluid collections. Musculoskeletal: No aggressive appearing focal osseous lesions. Moderate degenerative changes in the visualized thoracolumbar spine. IMPRESSION: 1. Mildly increased size of complex multilocular right pelvic abscess as described. 2. Tiny residual abscess at the right vaginal cuff at the previous site of the removed left percutaneous drainage catheter, unchanged. 3. Stable to slightly increased retroperitoneal and bilateral pelvic nodal metastases. 4. Minimal left hydronephrosis, decreased, secondary to infiltrate  left pelvic side wall adenopathy. 5. No evidence of bowel obstruction. Mild reactive wall thickening in the distal small bowel. No oral contrast accumulates within the pelvic fluid collections. Electronically Signed   By: Ilona Sorrel M.D.   On: 08/22/2015 12:07    Anti-infectives: Anti-infectives    Start     Dose/Rate Route Frequency Ordered Stop   08/21/15 1400  piperacillin-tazobactam (ZOSYN) IVPB 3.375 g     3.375 g 12.5 mL/hr over 240 Minutes Intravenous 3 times per day 08/21/15 1040     08/07/15 1400  piperacillin-tazobactam (ZOSYN) IVPB 3.375 g  Status:  Discontinued     3.375 g 12.5 mL/hr over 240 Minutes Intravenous 3 times per day 08/07/15 0905 08/19/15 1243   08/07/15 0600  ciprofloxacin (CIPRO) tablet 500 mg  Status:  Discontinued     500 mg Oral 2 times daily 08/07/15 0541 08/07/15 0842   08/06/15 0830  fluconazole (DIFLUCAN) IVPB 100 mg  Status:  Discontinued     100 mg 50 mL/hr over 60 Minutes Intravenous Every 24 hours 08/06/15 0819 08/06/15 0846   08/05/15 1600  anidulafungin (ERAXIS) 100 mg in sodium chloride 0.9 % 100 mL IVPB     100 mg over 90 Minutes Intravenous Every 24 hours 08/04/15 1509 08/07/15 2039   08/04/15 1600  anidulafungin (ERAXIS) 200 mg in sodium chloride 0.9 % 200 mL IVPB    Comments:  Pharmacy may adjust dosing strength, schedule, rate of infusion, etc as needed to optimize therapy   200 mg over 180 Minutes Intravenous  Once 08/04/15 1509 08/04/15 1928   07/30/15 1645  piperacillin-tazobactam (ZOSYN) IVPB 3.375 g  Status:  Discontinued     3.375 g 12.5 mL/hr over 240  Minutes Intravenous 3 times per day 07/30/15 1634 08/07/15 0541   07/30/15 1545  piperacillin-tazobactam (ZOSYN) IVPB 3.375 g     3.375 g 100 mL/hr over 30 Minutes Intravenous  Once 07/30/15 1541 07/30/15 1757      Assessment/Plan: IR drain 07/31/15 - culture: E coli CT IMAGE GUIDED DRAINAGE BY PERCUTANEOUS CATHETER, 08/12/15, LEFT lower abdomen 220 ml drained; Dr. Sandi Mariscal LLQ  drain removed after CT 08/18/15 Endometrial sarcoma,with bilateral pelvic node involvement/spinal & intraabdominal metastasis Hx of hysterectomy/nephrectomy [facility] 2016 -- chemo/radiation therapy/[facility] Vivien Rossetti, MD  Hx of gastric sleeve  Anemia secondary to chronic blood loss Body mass index is 41.6 Antibiotics: Zosyn restarted DVT: SCD's added  CT yesterday shows slight increase in size of complex right pelvic abscess. No contrast leak. Somewhat improved today after  episode of pain and fever.  Agree with suggestion by IR to  Flush and reposition drain tomorrow. Not really tolerating diet.  May need to restart TNA.   LOS: 24 days    Phoenix Riesen T 08/23/2015

## 2015-08-23 NOTE — Progress Notes (Signed)
MD made aware of temp of 101.0. No new orders given. MD mentioned to cont to watch pt. VWilliams,rn.

## 2015-08-24 ENCOUNTER — Inpatient Hospital Stay (HOSPITAL_COMMUNITY): Payer: Federal, State, Local not specified - PPO

## 2015-08-24 DIAGNOSIS — E876 Hypokalemia: Secondary | ICD-10-CM

## 2015-08-24 DIAGNOSIS — L8990 Pressure ulcer of unspecified site, unspecified stage: Secondary | ICD-10-CM | POA: Insufficient documentation

## 2015-08-24 LAB — URINE CULTURE

## 2015-08-24 LAB — CBC
HCT: 24.1 % — ABNORMAL LOW (ref 36.0–46.0)
Hemoglobin: 7.6 g/dL — ABNORMAL LOW (ref 12.0–15.0)
MCH: 27.4 pg (ref 26.0–34.0)
MCHC: 31.5 g/dL (ref 30.0–36.0)
MCV: 87 fL (ref 78.0–100.0)
PLATELETS: 419 10*3/uL — AB (ref 150–400)
RBC: 2.77 MIL/uL — ABNORMAL LOW (ref 3.87–5.11)
RDW: 16 % — AB (ref 11.5–15.5)
WBC: 11.8 10*3/uL — ABNORMAL HIGH (ref 4.0–10.5)

## 2015-08-24 LAB — BASIC METABOLIC PANEL
Anion gap: 8 (ref 5–15)
BUN: 7 mg/dL (ref 6–20)
CALCIUM: 8.3 mg/dL — AB (ref 8.9–10.3)
CO2: 25 mmol/L (ref 22–32)
CREATININE: 0.37 mg/dL — AB (ref 0.44–1.00)
Chloride: 109 mmol/L (ref 101–111)
GFR calc Af Amer: >60 mL/min (ref >60)
GLUCOSE: 97 mg/dL (ref 65–99)
Potassium: 2.9 mmol/L — ABNORMAL LOW (ref 3.5–5.1)
Sodium: 142 mmol/L (ref 135–145)

## 2015-08-24 LAB — MAGNESIUM: Magnesium: 1.6 mg/dL — ABNORMAL LOW (ref 1.7–2.4)

## 2015-08-24 LAB — PREALBUMIN: Prealbumin: 9.3 mg/dL — ABNORMAL LOW (ref 18–38)

## 2015-08-24 MED ORDER — ONDANSETRON HCL 4 MG/2ML IJ SOLN
INTRAMUSCULAR | Status: AC
  Filled 2015-08-24: qty 2

## 2015-08-24 MED ORDER — LIDOCAINE HCL 1 % IJ SOLN
INTRAMUSCULAR | Status: AC
  Filled 2015-08-24: qty 20

## 2015-08-24 MED ORDER — ONDANSETRON HCL 4 MG/2ML IJ SOLN
4.0000 mg | Freq: Four times a day (QID) | INTRAMUSCULAR | Status: DC | PRN
Start: 2015-08-24 — End: 2015-08-27
  Administered 2015-08-24: 4 mg via INTRAVENOUS

## 2015-08-24 MED ORDER — FAT EMULSION 20 % IV EMUL
250.0000 mL | INTRAVENOUS | Status: AC
  Administered 2015-08-24: 250 mL via INTRAVENOUS
  Filled 2015-08-24 (×2): qty 250

## 2015-08-24 MED ORDER — MAGNESIUM SULFATE IN D5W 10-5 MG/ML-% IV SOLN
1.0000 g | Freq: Once | INTRAVENOUS | Status: AC
  Administered 2015-08-24: 1 g via INTRAVENOUS
  Filled 2015-08-24: qty 100

## 2015-08-24 MED ORDER — POTASSIUM CHLORIDE 10 MEQ/100ML IV SOLN: 10.0000 meq | INTRAVENOUS | Status: DC

## 2015-08-24 MED ORDER — POTASSIUM CHLORIDE 10 MEQ/100ML IV SOLN
10.0000 meq | INTRAVENOUS | Status: AC
  Administered 2015-08-24 (×4): 10 meq via INTRAVENOUS
  Filled 2015-08-24 (×4): qty 100

## 2015-08-24 MED ORDER — PROMETHAZINE HCL 25 MG/ML IJ SOLN: 6.2500 mg | Freq: Four times a day (QID) | INTRAMUSCULAR | Status: DC | PRN

## 2015-08-24 MED ORDER — M.V.I. ADULT IV INJ
INJECTION | INTRAVENOUS | Status: AC
  Administered 2015-08-24: 22:00:00 via INTRAVENOUS
  Filled 2015-08-24: qty 720

## 2015-08-24 MED ORDER — SODIUM CHLORIDE 0.9 % IV SOLN
INTRAVENOUS | Status: AC
  Administered 2015-08-24 – 2015-08-26 (×3): via INTRAVENOUS
  Filled 2015-08-24 (×6): qty 1000

## 2015-08-24 MED ORDER — ACETAMINOPHEN 10 MG/ML IV SOLN
1000.0000 mg | Freq: Once | INTRAVENOUS | Status: AC
  Administered 2015-08-24: 1000 mg via INTRAVENOUS
  Filled 2015-08-24: qty 100

## 2015-08-24 MED ORDER — INSULIN ASPART 100 UNIT/ML ~~LOC~~ SOLN: 0.0000 [IU] | Freq: Four times a day (QID) | SUBCUTANEOUS | Status: DC

## 2015-08-24 MED ORDER — LIDOCAINE HCL 1 % IJ SOLN
INTRAMUSCULAR | Status: AC | PRN
  Administered 2015-08-24: 10 mL via INTRADERMAL

## 2015-08-24 MED ORDER — IOHEXOL 300 MG/ML  SOLN: 50.0000 mL | Freq: Once | INTRAMUSCULAR | Status: DC | PRN

## 2015-08-24 NOTE — Progress Notes (Signed)
Patient has drainage on dressing, but refuses dressing change.  Educated patient that this would increase infection risk, but patient states she wants surgeons to see the drainage.

## 2015-08-24 NOTE — Progress Notes (Signed)
PARENTERAL NUTRITION CONSULT NOTE -   Pharmacy Consult for TPN Indication: Perforated bowel; intolerant to enteral feeds  No Known Allergies  Patient Measurements: Height: 5' (152.4 cm) Weight: 216 lb 12.8 oz (98.34 kg) IBW/kg (Calculated) : 45.5 Adjusted Body Weight: 58.5 kg Usual Weight: ~98kg  Vital Signs: Temp: 103 F (39.4 C) (03/20 1702) Temp Source: Oral (03/20 1702) BP: 122/87 mmHg (03/20 1656) Pulse Rate: 136 (03/20 1656) Intake/Output from previous day: 03/19 0701 - 03/20 0700 In: 1801.3 [I.V.:1791.3] Out: 20 [Drains:20] Intake/Output from this shift: Total I/O In: -  Out: 243 [Urine:2; Drains:240; Stool:1]  Labs:  Recent Labs  08/22/15 0355 08/23/15 0430 08/24/15 0445  WBC 13.9* 12.6* 11.8*  HGB 8.5* 8.7* 7.6*  HCT 27.1* 27.3* 24.1*  PLT 419* 443* 419*    Recent Labs  08/22/15 0355 08/23/15 0430 08/24/15 0445 08/24/15 1500  NA 138 140 142  --   K 3.0* 3.1* 2.9*  --   CL 103 107 109  --   CO2 '23 23 25  '$ --   GLUCOSE 127* 162* 97  --   BUN '10 8 7  '$ --   CREATININE 0.56 0.46 0.37*  --   CALCIUM 8.4* 8.5* 8.3*  --   MG  --   --   --  1.6*   Estimated Creatinine Clearance: 78.6 mL/min (by C-G formula based on Cr of 0.37).   No results for input(s): GLUCAP in the last 72 hours. Medical History: Past Medical History  Diagnosis Date  . Abnormal uterine bleeding   . Anemia   . Fibroid   . Heart murmur     under 5 yrs of age  . Cancer ([place]) dx'd 07/2014    endometrial stromal sarcoma  . Radiation 12/01/14-01/05/15    pelvis 45 gray  . Radiation 05/18/2015-06/05/15    T4 thoracic spine area 35 gray   Assessment: 60 yo F admitted 2/23 with abdominal pain and noted to have perforated bowel.  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  She has been on Zosyn since admission for IAI and Eraxis  was added 2/28.   Clear liquid diet was attempted 2/25 however patient developed severe abdominal pain with nausea/vomiting.    Insulin Requirements during previous day:  - none - no hx DM  Current Nutrition: advanced to soft diet 3/15 - boost/Resource TID started on 3/6, pt reports finishing entire contents of doses charted  IVF: none  Central access: implanted port 11/19/14 TPN start date: 08/05/15  ASSESSMENT                                                                                                          HPI: 60 yo F admitted 2/23 with abdominal pain and noted to have perforated bowel.  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 She has  been on Zosyn since admission for IAI and Eraxis was added 2/28.   Clear liquid diet was attempted 2/25 however patient developed severe abdominal pain with nausea/vomiting.    Significant events:  3/1: IR consulted for drain adjustment (bulb changed) 3/3: TPN advanced to goal rate 3/5: abscess drainage decreased, 85 ml/24 hr, + BM's 3/6: patient reported BM this morning and eating about ~75% of meals (not documented in [facility]), Resource started 3/7 abd CT: no signif change in pelvic abscess, metastatic lymphadenopathy in the left lower pelvis. 3/8 drainage catheter in pelvis placed by IR 3/12 pt feels full, attributes to TPN. Decrease TPN  3/13 pt feels better re: fullness but having more abd pain, vomited x 1 this am 3/16 Patient tolerating diet.  TPN stopped.  3/20 Restarting TPN- patient not eating.   Today:   Glucose (goal cbgs <150): all cbgs <150  Electrolytes: K+ and Mg low today- repleted by MD.  CorrCa wnl.    Renal:  stable at patient's baseline  LFTs: Alk phos elevated, otherwise WNL.   TGs: 188 (3/2), 115 (3/6), 135 (3/13)  Prealbumin: 6.4 (3/2), 13.7 (3/6), 20.5  (3/13)  NUTRITIONAL GOALS                                                                                             RD recs (3/8): Kcal: 1950-2150 (20-22 kcal/kg) Protein: 120-140 grams of protein (1.25-1.45 grams/kg) Fluid: 2 L/day  Clinimix E 5/15 at a goal rate of 95 ml/hr + 20% fat emulsion at 10 ml/hr to provide: 114g/day protein, 2099 Kcal/day.  PLAN                                                                                                                         At 1800 today:  Start Clinimix E 5/15 at 85m/hr  20% fat emulsion at 10 ml/hr.  TPN to contain standard multivitamins and trace elements.  Restart moderate SSI & CBGs q6h  TPN lab panels on Mondays & Thursdays.  F/u daily.  MNetta Cedars PharmD, BCPS Pager: [phone]/20/2017 5:10 PM

## 2015-08-24 NOTE — Progress Notes (Signed)
  Subjective: She says she has more pain with Bm and makes her feel like she needs to void and have BM.  Not eating much.  Drainage looks about the same.  Objective: Vital signs in last 24 hours: Temp:  [97.4 F (36.3 C)-101 F (38.3 C)] 98.5 F (36.9 C) (03/20 0634) Pulse Rate:  [76-88] 83 (03/20 0634) Resp:  [16-22] 20 (03/20 0634) BP: (95-135)/(59-64) 135/64 mmHg (03/20 0634) SpO2:  [97 %] 97 % (03/20 0634) Last BM Date: 08/24/15 Drain:  20 recorded PO not recorded stool  X 1 yesterday 4 this Am so far. TM 101 yestreday at 1500 K+ 2.9 WBC still in the 11.8  range  CT 08/22/15 shows:  slight increase in size of complex right pelvic abscess. No contrast leak. Somewhat improved today after episode of pain and fever. Agree with suggestion by IR to Flush and reposition drain tomorrow.  Intake/Output from previous day: 03/19 0701 - 03/20 0700 In: 1801.3 [I.V.:1791.3] Out: 20 [Drains:20] Intake/Output this shift: Total I/O In: -  Out: 41 [Urine:1; Drains:40]  General appearance: alert, cooperative and no distress GI: soft, more tender mid lower abdomen over bladder.  few bS  Lab Results:   Recent Labs  08/23/15 0430 08/24/15 0445  WBC 12.6* 11.8*  HGB 8.7* 7.6*  HCT 27.3* 24.1*  PLT 443* 419*    BMET  Recent Labs  08/23/15 0430 08/24/15 0445  NA 140 142  K 3.1* 2.9*  CL 107 109  CO2 23 25  GLUCOSE 162* 97  BUN 8 7  CREATININE 0.46 0.37*  CALCIUM 8.5* 8.3*   PT/INR No results for input(s): LABPROT, INR in the last 72 hours.   Recent Labs Lab 08/20/15 0534  AST 19  ALT 21  ALKPHOS 140*  BILITOT 0.6  PROT 7.1  ALBUMIN 2.9*     Lipase     Component Value Date/Time   LIPASE 18 07/30/2015 1251     Studies/Results: No results found.  Medications: . acetaminophen  1,000 mg Oral TID  . antiseptic oral rinse  7 mL Mouth Rinse BID  . feeding supplement  1 Container Oral TID PC & HS  . fluticasone  1 spray Each Nare Daily  . nystatin  5  mL Oral QID  . nystatin   Topical BID  . piperacillin-tazobactam (ZOSYN)  IV  3.375 g Intravenous 3 times per day  . promethazine  12.5 mg Intravenous Once  . saccharomyces boulardii  250 mg Oral BID  . sodium chloride flush  3 mL Intravenous Q12H    Assessment/Plan IR drain 07/31/15 - culture: E coli CT IMAGE GUIDED DRAINAGE BY PERCUTANEOUS CATHETER, 08/12/15, LEFT lower abdomen 220 ml drained; Dr. Sandi Mariscal LLQ drain removed after CT 08/18/15 Endometrial sarcoma,with bilateral pelvic node involvement/spinal & intraabdominal metastasis Hx of hysterectomy/nephrectomy [facility] 2016 -- chemo/radiation therapy/[facility] Vivien Rossetti, MD  Hx of gastric sleeve  Anemia secondary to chronic blood loss Body mass index is 41.6 Antibiotics: Zosyn restarted DVT: SCD's added     Plan:  She is to get repositioning of her drain today, Not really tolerating diet. May need to restart TNA. He K+ is low and I have ordered Mag, and prealbumin.    Will see how she does with new drain placement and decide on TNA in AM.    It is to late to get it tonight anyway.         LOS: 25 days    Erin Santiago 08/24/2015 ]

## 2015-08-24 NOTE — Progress Notes (Signed)
[facility] PROGRESS NOTE  Erin Santiago [id number] DOB: [birth year] DOA: 07/30/2015 PCP: Erin Seashore, MD  Brief interval history  Erin Santiago is a 60 y.o. 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 Patient is status post chemotherapy and radiation to the pelvis and back and was due to start chemotherapy on the day of admission due to progression of disease involving the left abdominal pain. Presented to the ED with complaints of 1-2 day history of diffuse abdominal pain. CT abdomen and pelvis showed bowel perforation. Large 9 x 4.5 cm complex right lower quadrant and pelvic abscess containing a small amount of contrast suggesting communication with the distal ileum. general surgery consulted recommended initially nonoperative percutaneous drainage.She was placed on bowel rest and monitored. Interventional radiology was also consulted for drain placement. Patient had a percutaneous drain placed on 07/31/2015 and was placed empirically on IV Zosyn from admission. Wound/abscess cultures grew out Escherichia coli. Started on clears, Repeat CT abdomen and pelvis pending for 08/04/2015   HPI/Subjective: Patient sitting at bedside chair- states her nasal stuffiness is improving- has a sore area on her but - discussed measures to alleviate pressure- no nausea, abdominal pain or vomiting.   Assessment/Plan: #1 Bowel perforation, complex intra-abdominal fluid collection 9 x 4.5 cm right lower quadrant and pelvic -likely due to tumor invasion -Per CT abdomen and pelvis appears that perforation may have walled off and contained.  -S/p RLQ percutaneous drain 07/31/2015 per IR.  Wound/abscess cultures growing Escherichia coli. She has been on IV Zosyn.  -Gen. surgery and interventional radiology following. -On 08/11/2015 repeat CT scan of abdomen  and pelvis did not reveal significant change in large pelvic abscess per radiology, measuring 7 x 11.5 cm -3/8 underwent CT guided placed of a 10 Fr drainage catheter placement into the pelvis via the left lower abdomen yielding 220 cc of purulent material.  - She was re-started on IV Zosyn on 08/21/2015 given fever and elevated white count. A repeat CT scan of abdomen and pelvis performed on 08/22/2015 reported by radiology to have a mildly increased size of a complex multiloculated right pelvic abscess. -On 08/22/2015 she reports feeling better, though spiked a fever of 101.9 at 4:40 am. She remains on zosyn  -Still not tolerating PO -Case discussed with general surgery will undergo repositioning of drain today. Since she has not been tolerating by mouth intake will likely restart TNA.   #2 febrile illness -On 08/21/2015 patient reporting feeling poorly, found to have low-grade temperature 100.8. Labs reviewed all be trend in her white count to 12,700. -Chest x-ray did not reveal developing pneumonia. Urinalysis was unremarkable. -She was started on IV Zosyn. -RN reporting that patient has had several episodes of diarrhea, however, she states this started after drinking contrast.   #3 dehydration/metabolic acidosis -Resolving. She was treated with IV fluids.   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 Patient status post chemotherapy and radiation to the pelvis. Patient was to start chemotherapy on the day of admission due to progression of disease involving the left, iliac adenopathy, 2 masses in the right lower quadrant seen on  CT scan on 07/21/2015.   -She had a  repeat CT scan on 08/11/2015 that revealed mild progression of abdominal pelvic lymphadenopathy consistent with progression of metastatic disease. -Radiology also revealed increase in mild hydronephrosis due to metastatic lymphadenopathy. Renal function remains stable.  -Dr. Marko Santiago of medical oncology following, overall prognosis is poor  prophylaxis SCDs for DVT prophylaxis.  Code Status: Full Family Communication: Updated patient. No family at bedside. Disposition Plan: Plan to have drain repositioned today    Consultants:  Gen. surgery: Dr. Excell Seltzer 07/30/2015   oncology: Dr. Marko Santiago 07/31/2015  Interventional radiology  Procedures:  CT abdomen and pelvis 07/30/2015  Acute abdominal series 07/30/2015  Right lower quadrant abdominal abscess drain with CT 07/31/2015 for Dr. Annamaria Santiago  Antibiotics:  IV Zosyn 07/30/2015   Objective: Filed Vitals:   08/23/15 2133 08/24/15 0634  BP: 106/63 135/64  Pulse: 88 83  Temp: 98.7 F (37.1 C) 98.5 F (36.9 C)  Resp: 22 20    Intake/Output Summary (Last 24 hours) at 08/24/15 1534 Last data filed at 08/24/15 1154  Gross per 24 hour  Intake 1796.25 ml  Output     61 ml  Net 1735.25 ml   Filed Weights   08/17/15 0500 08/22/15 0650 08/23/15 0602  Weight: 97.478 kg (214 lb 14.4 oz) 98.34 kg (216 lb 12.8 oz) 98.34 kg (216 lb 12.8 oz)    Exam:   General:   She seems a little better today compared to yesterday  cardiovascular: RRR  Respiratory: Normal respiratory effort, lungs are clear to auscultation bilaterally  Abdomen: Soft, nontender, nondistended. Drain in LLQ- there is mild erythema surrounding drain. Patient reporting pain to palpation over this region.   Musculoskeletal: No clubbing cyanosis or edema.   Data Reviewed: Basic Metabolic Panel:  Recent Labs Lab 08/20/15 0534 08/22/15 0355 08/23/15 0430 08/24/15 0445  NA 138 138 140 142  K 3.5 3.0* 3.1* 2.9*  CL 103 103 107 109  CO2 26 23 23 25   GLUCOSE  108* 127* 162* 97  BUN 15 10 8 7   CREATININE 0.58 0.56 0.46 0.37*  CALCIUM 8.8* 8.4* 8.5* 8.3*  MG 1.9  --   --   --   PHOS 3.3  --   --   --    Liver Function Tests:  Recent Labs Lab 08/20/15 0534  AST 19  ALT 21  ALKPHOS 140*  BILITOT 0.6  PROT 7.1  ALBUMIN 2.9*   No results for input(s): LIPASE, AMYLASE in the last 168 hours. No results for input(s): AMMONIA in the last 168 hours. CBC:  Recent Labs Lab 08/21/15 0509 08/22/15 0355 08/23/15 0430 08/24/15 0445  WBC 12.7* 13.9* 12.6* 11.8*  HGB 9.8* 8.5* 8.7* 7.6*  HCT 30.6* 27.1* 27.3* 24.1*  MCV 83.4 87.1 86.4 87.0  PLT 449* 419* 443* 419*   Cardiac Enzymes: No results for input(s): CKTOTAL, CKMB, CKMBINDEX, TROPONINI in the last 168 hours. BNP (last 3 results) No results for input(s): BNP in the last 8760 hours.  ProBNP (last 3 results) No results for input(s): PROBNP in the last 8760 hours.  CBG:  Recent Labs Lab 08/19/15 1155 08/19/15 1721 08/19/15 2329 08/20/15 0602 08/20/15 1159  GLUCAP 100* 51* 93 97 118*    Recent Results (from the past 240 hour(s))  C difficile quick scan w PCR reflex     Status: None   Collection Time: 08/22/15 10:14 PM  Result Value Ref Range Status   C Diff antigen NEGATIVE NEGATIVE Final  C Diff toxin NEGATIVE NEGATIVE Final   C Diff interpretation Negative for toxigenic C. difficile  Final  Urine culture     Status: None   Collection Time: 08/23/15  1:30 AM  Result Value Ref Range Status   Specimen Description URINE, CLEAN CATCH  Final   Special Requests NONE  Final   Culture   Final    8,000 COLONIES/mL INSIGNIFICANT GROWTH Performed at [facility]    Report Status 08/24/2015 FINAL  Final     Studies: No results found.  Scheduled Meds: . acetaminophen  1,000 mg Oral TID  . antiseptic oral rinse  7 mL Mouth Rinse BID  . feeding supplement  1 Container Oral TID PC & HS  . fluticasone  1 spray Each Nare Daily  . lidocaine      . nystatin  5 mL  Oral QID  . nystatin   Topical BID  . piperacillin-tazobactam (ZOSYN)  IV  3.375 g Intravenous 3 times per day  . promethazine  12.5 mg Intravenous Once  . saccharomyces boulardii  250 mg Oral BID  . sodium chloride flush  3 mL Intravenous Q12H   Continuous Infusions: . 0.9 % sodium chloride with kcl 100 mL/hr at 08/24/15 1358    Time spent: 15 mins    Kelvin Cellar, MD [facility] Pager [email or web address], password [facility] 08/24/2015, 3:34 PM  LOS: 25 days

## 2015-08-24 NOTE — Progress Notes (Signed)
Patient ID: Erin Santiago, female   DOB: 1956-01-28, 60 y.o.   MRN: [id number]    Referring Physician(s): ACZYSA,YTKZS  Supervising Physician: Corrie Mckusick  Chief Complaint:  RLQ abdominal abscess  Subjective:  Pt still with pelvic discomfort, occ nausea; frustrated over length of stay  Allergies: Review of patient's allergies indicates no known allergies.  Medications: Prior to Admission medications   Medication Sig Start Date End Date Taking? Authorizing Provider  calcium carbonate (TUMS - DOSED IN MG ELEMENTAL CALCIUM) 500 MG chewable tablet Chew 2 tablets by mouth daily.    Yes Historical Provider, MD  dexamethasone (DECADRON) 4 MG tablet Take 2 tablets by mouth once a day on the day after chemotherapy and then take 2 tablets two times a day for 2 days. Take with food. 07/27/15  Yes Lennis Marion Downer, MD  docusate sodium (COLACE) 100 MG capsule Take 100 mg by mouth 2 (two) times daily as needed for mild constipation. Reported on 05/28/2015 07/17/14  Yes Historical Provider, MD  hydrocortisone 2.5 % cream Apply 1 application topically as needed (itch/psoriasis.). Reported on 07/17/2015 05/19/14  Yes Historical Provider, MD  lidocaine-prilocaine (EMLA) cream Apply to Porta-cath 1-2 hrs prior to access. Cover with [employer]. 08/18/14  Yes Lennis Marion Downer, MD  LORazepam (ATIVAN) 1 MG tablet Place 1/2 to 1 tablet under the tongue or swallow every 6 hours as needed for nausea 07/22/15  Yes Lennis P Livesay, MD  meloxicam (MOBIC) 15 MG tablet Take 15 mg by mouth daily.  06/09/14  Yes Historical Provider, MD  ondansetron (ZOFRAN) 8 MG tablet Take 1 tablet (8 mg total) by mouth every 8 (eight) hours as needed for nausea or vomiting. 07/22/15  Yes Lennis Marion Downer, MD  prochlorperazine (COMPAZINE) 10 MG tablet Take 1 tablet (10 mg total) by mouth every 6 (six) hours as needed (Nausea or vomiting). 07/27/15  Yes Lennis Marion Downer, MD  Psyllium 28.3 % POWD Take 1 packet by mouth daily as needed (for  fiber). Reported on 05/26/2015   Yes Historical Provider, MD  Resveratrol 250 MG CAPS Take 250 mg by mouth 2 (two) times daily. Reported on 05/26/2015   Yes Historical Provider, MD  ferrous fumarate (HEMOCYTE) 325 (106 FE) MG TABS tablet Take 1 tab daily on an empty stomach with OJ or Vitamin C 500 mg Patient not taking: Reported on 07/09/2015 09/15/14   Lennis Marion Downer, MD  sucralfate (CARAFATE) 1 GM/10ML suspension Take 10 mLs (1 g total) by mouth 4 (four) times daily -  with meals and at bedtime. Patient not taking: Reported on 07/30/2015 05/20/15   [name], MD     Vital Signs: BP 135/64 mmHg  Pulse 83  Temp(Src) 98.5 F (36.9 C) (Oral)  Resp 20  Ht 5' (1.524 m)  Wt 216 lb 12.8 oz (98.34 kg)  BMI 42.34 kg/m2  SpO2 97%  LMP 02/04/2014  Physical Exam Pt awake/alert; RLQ drain intact, insertion site mildly indurated,/tender to palpation; feculent output in bag-20 cc recorded today; chest-clear to auscultation bilaterally. Heart with regular rate and rhythm.  Imaging: Dg Chest 2 View  08/21/2015  CLINICAL DATA:  Fever.  History of small bowel leak. EXAM: CHEST - 2 VIEW COMPARISON:  Abdominal series on 07/30/2015 FINDINGS: The heart size and mediastinal contours are within normal limits. Stable appearance of Port-A-Cath. Improved aeration at both lung bases with mild residual atelectasis remaining bilaterally. There is no evidence of pulmonary edema, consolidation, pneumothorax, nodule or pleural fluid. The visualized skeletal structures  are unremarkable. IMPRESSION: Improved bilateral aeration with residual bibasilar atelectasis remaining. Electronically Signed   By: Aletta Edouard M.D.   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 Low-grade fever. Persistent fluid draining from percutaneous tubes. EXAM: CT ABDOMEN AND PELVIS WITH CONTRAST TECHNIQUE: Multidetector CT imaging of the abdomen and pelvis was performed using the standard protocol following bolus administration of intravenous contrast. CONTRAST:  51m OMNIPAQUE IOHEXOL 300 MG/ML SOLN, 1044mOMNIPAQUE IOHEXOL 300 MG/ML SOLN COMPARISON:  08/18/2015 CT abdomen/pelvis . FINDINGS: Lower chest: Peripheral left upper lobe 3 mm pulmonary nodule (series 4/image 3) is stable. Mild bibasilar atelectasis. Partially visualized is the tip of a superior approach central venous catheter in the lower third of the superior vena cava. Hepatobiliary: Normal liver with no liver mass. Normal gallbladder with no radiopaque cholelithiasis. No biliary ductal dilatation. Pancreas: Normal, with no mass or duct dilation. Spleen: Normal size. No mass. Adrenals/Urinary Tract: Normal adrenals. No right hydronephrosis. Minimal left hydronephrosis, decreased. No renal mass. Collapsed and grossly normal bladder. Stomach/Bowel: Stable postsurgical changes in the stomach from prior sleeve surgery, with no gross gastric abnormality. No significantly dilated small bowel loops. Oral contrast progresses to the colon. No focal small bowel caliber transition. Reactive wall thickening within several distal ileal small bowel loops in the right lower quadrant. Normal appendix. Minimal diverticulosis in the sigmoid colon, with no large bowel wall thickening. Vascular/Lymphatic: Normal caliber abdominal aorta. Patent portal, splenic, hepatic and renal veins. Enlarged 3.7 cm aortocaval node (series 7/ image 55), previously 3.5 cm, slightly increased, with possible invasion of the third portion of the duodenum by this node. Enlarged clustered left common iliac nodes, largest 2.2 cm, not appreciably changed. Enlarged 2.3 cm irregular left internal iliac node (series 2/ image 81), not  appreciably changed. Enlarged 1.9 cm right external iliac node (series 2/image 70), not appreciably changed. Reproductive: Status post hysterectomy.  No adnexal mass. Other: Interval removal of the left ventral pelvic percutaneous drainage catheter. Tiny residual thick walled 1.8 x 1.6 cm collection at the right vaginal cuff margin (series 2/ image 83), previously 1.9 x 1.7 cm, not appreciably changed. Stable position of ventral right pelvic percutaneous drainage catheter with the distal pigtail tip within the right anterior pelvis. There is a complex multilocular gas containing thick walled abscess in the right lower quadrant, with a dominant 5.6 x 4.8 cm block ill superior to the drainage catheter (series 2/ image 73), previously 4.9 x 4.3 cm, mildly increased and a 4.6 x 4.0 cm o'clock ill posterior/ inferior to the drainage catheter tip (series 2/ image 79), previously 3.4 x 2.5 cm, mildly increased. No oral contrast is seen within the pelvic fluid collections. No free intraperitoneal air. No new fluid collections. Musculoskeletal: No aggressive appearing focal osseous lesions. Moderate degenerative changes in the visualized thoracolumbar spine. IMPRESSION: 1. Mildly increased size of complex multilocular right pelvic abscess as described. 2. Tiny residual abscess at the right vaginal cuff at the previous site of the removed left percutaneous drainage catheter, unchanged. 3. Stable to slightly increased retroperitoneal and bilateral pelvic nodal metastases. 4. Minimal left hydronephrosis, decreased, secondary to infiltrate left pelvic side wall adenopathy. 5. No evidence of bowel obstruction. Mild reactive wall thickening in the distal small bowel. No oral contrast  accumulates within the pelvic fluid collections. Electronically Signed   By: Ilona Sorrel M.D.   On: 08/22/2015 12:07    Labs:  CBC:  Recent Labs  08/21/15 0509 08/22/15 0355 08/23/15 0430 08/24/15 0445  WBC 12.7* 13.9* 12.6* 11.8*  HGB  9.8* 8.5* 8.7* 7.6*  HCT 30.6* 27.1* 27.3* 24.1*  PLT 449* 419* 443* 419*    COAGS:  Recent Labs  08/26/14 1300 07/30/15 2242 07/31/15 0428 08/12/15 0539  INR 0.95 1.49 1.41 1.35  APTT 27  --  32  --     BMP:  Recent Labs  08/20/15 0534 08/22/15 0355 08/23/15 0430 08/24/15 0445  NA 138 138 140 142  K 3.5 3.0* 3.1* 2.9*  CL 103 103 107 109  CO2 '26 23 23 25  '$ GLUCOSE 108* 127* 162* 97  BUN '15 10 8 7  '$ CALCIUM 8.8* 8.4* 8.5* 8.3*  CREATININE 0.58 0.56 0.46 0.37*  GFRNONAA >60 >60 >60 >60  GFRAA >60 >60 >60 >60    LIVER FUNCTION TESTS:  Recent Labs  08/10/15 0604 08/13/15 0822 08/17/15 0532 08/20/15 0534  BILITOT 1.3* 1.0 0.7 0.6  AST 21 13* 27 19  ALT 8* 11* 25 21  ALKPHOS 70 101 184* 140*  PROT 6.8 7.1 6.9 7.1  ALBUMIN 2.8* 2.7* 2.8* 2.9*    Assessment and Plan: Met endom ca; s/p drainage of RLQ abd abscess 2/24 ; recent temp elevations noted but currently afebrile; WBC 11.8, hgb 7.6; c diff neg; CT abdomen /pelvis from 3/18 reviewed by Dr. Earleen Newport today. Images show mildly increased size of multilocular right pelvic abscess with tiny residual abscess at the right vaginal cuff; no evidence of bowel obstruction. Plan today is for right lower quadrant drain injection with manipulation and possible upsizing. Above plans discussed with patient with her understanding and consent.   Electronically Signed: D. Rowe Robert 08/24/2015, 1:43 PM   I spent a total of 15 minutes at the the patient's bedside AND on the patient's hospital floor or unit, greater than 50% of which was counseling/coordinating care for RLQ abscess drain

## 2015-08-24 NOTE — Progress Notes (Signed)
PT Cancellation Note  Patient Details Name: Erin Santiago MRN: [id number] DOB: 1955/12/21   Cancelled Treatment:    Reason Eval/Treat Not Completed: Patient at procedure or test/unavailable   Marcelino Freestone PT [id number]  08/24/2015, 3:10 PM

## 2015-08-24 NOTE — Procedures (Signed)
Interventional Radiology Procedure Note  Procedure: Indwelling abscess drain injection and exchange/upsize from 43F to 24F.  Findings:  Residual abscess cavity, with aspiration of ~40cc of frankly feculent fluid.  180cc of saline irrigation and aspiration.  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  .  Complications: None Recommendations:  - Continue to monitor output of drain, with record of output per shift - BID sterile saline flushes.  - Routine wound care   Signed,  Dulcy Fanny. Earleen Newport, DO

## 2015-08-24 NOTE — Progress Notes (Signed)
Having nausea and vomiting with fever up to 103 after drain exchange.  She is coughing but nausea is better.  Check film and be sure she is not overly distended.  Labs in AM, I have ask Pharmacy to resume TNA. Prealbumin is pending.

## 2015-08-25 LAB — GLUCOSE, CAPILLARY
GLUCOSE-CAPILLARY: 112 mg/dL — AB (ref 65–99)
GLUCOSE-CAPILLARY: 112 mg/dL — AB (ref 65–99)
GLUCOSE-CAPILLARY: 118 mg/dL — AB (ref 65–99)
Glucose-Capillary: 110 mg/dL — ABNORMAL HIGH (ref 65–99)
Glucose-Capillary: 121 mg/dL — ABNORMAL HIGH (ref 65–99)

## 2015-08-25 LAB — CBC
HEMATOCRIT: 25.1 % — AB (ref 36.0–46.0)
HEMOGLOBIN: 8 g/dL — AB (ref 12.0–15.0)
MCH: 27.7 pg (ref 26.0–34.0)
MCHC: 31.9 g/dL (ref 30.0–36.0)
MCV: 86.9 fL (ref 78.0–100.0)
Platelets: 455 10*3/uL — ABNORMAL HIGH (ref 150–400)
RBC: 2.89 MIL/uL — AB (ref 3.87–5.11)
RDW: 16 % — ABNORMAL HIGH (ref 11.5–15.5)
WBC: 11.2 10*3/uL — ABNORMAL HIGH (ref 4.0–10.5)

## 2015-08-25 LAB — DIFFERENTIAL
BASOS ABS: 0 10*3/uL (ref 0.0–0.1)
Basophils Relative: 0 %
EOS ABS: 0.8 10*3/uL — AB (ref 0.0–0.7)
Eosinophils Relative: 7 %
LYMPHS ABS: 0.6 10*3/uL — AB (ref 0.7–4.0)
LYMPHS PCT: 5 %
Monocytes Absolute: 0.7 10*3/uL (ref 0.1–1.0)
Monocytes Relative: 6 %
NEUTROS PCT: 82 %
Neutro Abs: 9.1 10*3/uL — ABNORMAL HIGH (ref 1.7–7.7)

## 2015-08-25 LAB — COMPREHENSIVE METABOLIC PANEL
ALT: 12 U/L — AB (ref 14–54)
ANION GAP: 9 (ref 5–15)
AST: 13 U/L — AB (ref 15–41)
Albumin: 2.5 g/dL — ABNORMAL LOW (ref 3.5–5.0)
Alkaline Phosphatase: 91 U/L (ref 38–126)
BILIRUBIN TOTAL: 0.9 mg/dL (ref 0.3–1.2)
BUN: 7 mg/dL (ref 6–20)
CHLORIDE: 108 mmol/L (ref 101–111)
CO2: 25 mmol/L (ref 22–32)
Calcium: 8.4 mg/dL — ABNORMAL LOW (ref 8.9–10.3)
Creatinine, Ser: 0.54 mg/dL (ref 0.44–1.00)
GFR calc Af Amer: >60 mL/min (ref >60)
GFR calc non Af Amer: >60 mL/min (ref >60)
GLUCOSE: 134 mg/dL — AB (ref 65–99)
POTASSIUM: 3.3 mmol/L — AB (ref 3.5–5.1)
Sodium: 142 mmol/L (ref 135–145)
Total Protein: 6 g/dL — ABNORMAL LOW (ref 6.5–8.1)

## 2015-08-25 LAB — MAGNESIUM: MAGNESIUM: 1.8 mg/dL (ref 1.7–2.4)

## 2015-08-25 LAB — PREALBUMIN: Prealbumin: 8.6 mg/dL — ABNORMAL LOW (ref 18–38)

## 2015-08-25 LAB — PHOSPHORUS: Phosphorus: 2.1 mg/dL — ABNORMAL LOW (ref 2.5–4.6)

## 2015-08-25 LAB — TRIGLYCERIDES: TRIGLYCERIDES: 135 mg/dL (ref <150)

## 2015-08-25 MED ORDER — TRACE MINERALS CR-CU-MN-SE-ZN 10-1000-500-60 MCG/ML IV SOLN
INTRAVENOUS | Status: AC
  Administered 2015-08-25: 18:00:00 via INTRAVENOUS
  Filled 2015-08-25: qty 1320

## 2015-08-25 MED ORDER — FAT EMULSION 20 % IV EMUL
120.0000 mL | INTRAVENOUS | Status: AC
  Administered 2015-08-25: 120 mL via INTRAVENOUS
  Filled 2015-08-25: qty 250

## 2015-08-25 MED ORDER — POTASSIUM PHOSPHATES 15 MMOLE/5ML IV SOLN
10.0000 mmol | Freq: Once | INTRAVENOUS | Status: AC
  Administered 2015-08-25: 10 mmol via INTRAVENOUS
  Filled 2015-08-25: qty 3.33

## 2015-08-25 NOTE — Progress Notes (Signed)
PT Cancellation Note  Patient Details Name: Erin Santiago MRN: [id number] DOB: May 22, 1956   Cancelled Treatment:    Reason Eval/Treat Not Completed: Pain limiting ability to participate (reports drain , abd and that her knees are much worse since she cannot take the meds that she used.  Encouraged patient to call for assistance for ambulation.)   [facility] PT [id number]  [name] 08/25/2015, 2:49 PM

## 2015-08-25 NOTE — Progress Notes (Signed)
Subjective: Complaining of pain at drain site.  She feels better than last PM.  I put her back on TNA.    Objective: Vital signs in last 24 hours: Temp:  [98.4 F (36.9 C)-103 F (39.4 C)] 98.4 F (36.9 C) (03/21 0644) Pulse Rate:  [74-136] 74 (03/21 0644) Resp:  [18-20] 20 (03/21 0644) BP: (112-143)/(52-87) 118/55 mmHg (03/21 0644) SpO2:  [94 %-100 %] 96 % (03/21 0644) Last BM Date: 08/24/15   280 from the drain restarting TNA Temp 103 down to 99.9 at 1900 and no fever since.  VSS Prealbumin is 8.6, WBC is stable at 11.2, ongoing anemia K+ 3.3 Drain changed yesterday   Intake/Output from previous day: 03/20 0701 - 03/21 0700 In: 8586.8 [IV Piggyback:550; [id number] Out: 283 [Urine:2; Drains:280; Stool:1] Intake/Output this shift:    General appearance: alert, cooperative and no distress Resp: clear to auscultation bilaterally GI: remains sore, drainage still looks red feculant as before.  Lab Results:   Recent Labs  08/24/15 0445 08/25/15 0603  WBC 11.8* 11.2*  HGB 7.6* 8.0*  HCT 24.1* 25.1*  PLT 419* 455*    BMET  Recent Labs  08/24/15 0445 08/25/15 0603  NA 142 142  K 2.9* 3.3*  CL 109 108  CO2 25 25  GLUCOSE 97 134*  BUN 7 7  CREATININE 0.37* 0.54  CALCIUM 8.3* 8.4*   PT/INR No results for input(s): LABPROT, INR in the last 72 hours.   Recent Labs Lab 08/20/15 0534 08/25/15 0603  AST 19 13*  ALT 21 12*  ALKPHOS 140* 91  BILITOT 0.6 0.9  PROT 7.1 6.0*  ALBUMIN 2.9* 2.5*     Lipase     Component Value Date/Time   LIPASE 18 07/30/2015 1251     Studies/Results: Ir Catheter Tube Change  08/24/2015  INDICATION: 60 year old female with a history of endometrial sarcoma with diagnosis February 2016. She has had recent abscess drainage with percutaneous drainage performed from the right lateral pelvis/ abdomen 07/31/2015, and then from the left anterior 08/12/2015. EXAM: SINUS TRACT INJECTION / FISTULOGRAM COMPARISON:  Multiple prior  CT, most recent 08/22/2015 MEDICATIONS: No additional medications ANESTHESIA/SEDATION: None COMPLICATIONS: None PROCEDURE: Informed written consent was obtained from the patient after a thorough discussion of the procedural risks, benefits and alternatives. All questions were addressed. Maximal Sterile Barrier Technique was utilized including caps, mask, sterile gowns, sterile gloves, sterile drape, hand hygiene and skin antiseptic. A timeout was performed prior to the initiation of the procedure. Scout images were performed. The skin and subcutaneous tissues surrounding the indwelling drain were generously infiltrated 1% lidocaine for local anesthesia. The indwelling drain was injected with contrast with images stored. The catheter was ligated and a 035 Amplatz wire was advanced into abdominal collection. Using modified Seldinger technique, the indwelling drain was removed any new 14 French pigtail catheter was advanced into the collection. Pigtail catheter was formed, and 30 cc of frankly feculent material was aspirated. Approximately 180 cc of sterile saline was used to your a gate the tube in the cavity, with aspiration of the infused fluid. Catheter was sutured in position and attached to gravity drainage. Patient tolerated the procedure well and remained hemodynamically stable throughout. No complications were encountered and no significant blood loss encountered. 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 Residual abscess cavity. New 14 French pigtail catheter into the right abdomen. 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 The drain was then exchanged for a 14 French pigtail catheter. Signed, Dulcy Fanny. Earleen Newport, DO [facility] Radiology Electronically Signed   By: Corrie Mckusick D.O.   On: 08/24/2015 16:26    Dg Abd Portable 1v  08/24/2015  CLINICAL DATA:  Sudden nausea and vomiting, concern clinically for sepsis EXAM: PORTABLE ABDOMEN - 1 VIEW COMPARISON:  None. FINDINGS: Pigtail catheter right lower quadrant again noted. Gas throughout small large bowel. IMPRESSION: The top catheter again noted. No specific radiographic abnormalities. Electronically Signed   By: Skipper Cliche M.D.   On: 08/24/2015 17:46    Medications: . acetaminophen  1,000 mg Oral TID  . antiseptic oral rinse  7 mL Mouth Rinse BID  . feeding supplement  1 Container Oral TID PC & HS  . fluticasone  1 spray Each Nare Daily  . insulin aspart  0-15 Units Subcutaneous Q6H  . nystatin  5 mL Oral QID  . nystatin   Topical BID  . piperacillin-tazobactam (ZOSYN)  IV  3.375 g Intravenous 3 times per day  . potassium phosphate IVPB (mmol)  10 mmol Intravenous Once  . promethazine  12.5 mg Intravenous Once  . saccharomyces boulardii  250 mg Oral BID  . sodium chloride flush  3 mL Intravenous Q12H    Assessment/Plan IR drain 07/31/15 - culture: E coli CT IMAGE GUIDED DRAINAGE BY PERCUTANEOUS CATHETER, 08/12/15, LEFT lower abdomen 220 ml drained; Dr. 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     LOS: 26 days    Rithika Seel 08/25/2015

## 2015-08-25 NOTE — Progress Notes (Signed)
Nutrition Follow-up  DOCUMENTATION CODES:   Morbid obesity  INTERVENTION:  - Continue TPN per pharmacy - Continue CLD with Boost Breeze supplement and advance diet as medically feasible - RD will continue to monitor for needs and changes  NUTRITION DIAGNOSIS:   Inadequate oral intake related to altered GI function as evidenced by per patient/family report. 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 Patient had presented to the ED with complaints of 1-2 day history of diffuse abdominal pain.   New consult for restart of TPN. Pt on CLD since 3/20 at 72 and was on Soft diet prior to that time. Pt was NPO during the day yesterday prior to CLD order for drainage exchange with upsize from 10 [place] to 14 [place] tubing. Pt denies nausea today and states that she is feeling much better with TPN restart. Pt denies any nutrition-related questions or concerns and reports that Pharmacist spoke with her about TPN prior to restart.  Plan per pharmacy based on note today at 0752: Patient receives 36g protein and 1000 kcal from [employer] daily. Clinimix 5/15 at 55 ml/hr + 20% lipid emulsion at 5 ml/hr will provide 66g protein and 1177 kcal daily. Total daily nutritional intake = 102g protein, 2177 kcal. PLAN  This morning:  KPhos 10 mmol/250 mL D5W IV x  1 At 1800 today:  Advance Clinimix E 5/15 to 38ml/hr. Would like to cycle this if patient will require TPN at discharge.  20% fat emulsion at 5 ml/hr.  TPN to contain standard multivitamins and trace elements.  Continue moderate SSI & CBGs q6h  TPN lab panels on Mondays & Thursdays. BMET, Mag, Phos in AM.    RD will continue to monitor POC and needs concerning nutrition support and PO intakes. Will meet needs if intakes and nutrition support remain the same as outlined by pharmacy note above. Medications reviewed. IVF: NS-40 mEq KCl @ 100 mL/hr. Labs reviewed; K: 3.3 mmol/L, Ca: 8.4 mg/dL, Phos: 2.1 mg/dL.  Diet Order:  Diet clear liquid Room service appropriate?: Yes; Fluid consistency:: Thin TPN (CLINIMIX-E) Adult TPN (CLINIMIX-E) Adult  Skin:  Wound (see comment) (Stage 2 bilateral buttocks pressure injuries, R abdominal incision from 07/31/15)  Last BM:  3/20  Height:   Ht Readings from Last 1 Encounters:  08/22/15 5' (1.524 m)    Weight:   Wt Readings from Last 1 Encounters:  08/23/15 216 lb 12.8 oz (98.34 kg)    Ideal Body Weight:  45.45 kg  BMI:  Body mass index is 42.34 kg/(m^2).  Estimated Nutritional Needs:   Kcal:  1950-2150 (20-22 kcal/kg)  Protein:  120-140 grams of protein (1.25-1.45 grams/kg)  Fluid:  2 L/day  EDUCATION NEEDS:   No education needs identified at this time     [name], RD, LDN Inpatient Clinical Dietitian Pager # [phone] After hours/weekend pager # [phone]

## 2015-08-25 NOTE — Progress Notes (Signed)
[facility] PROGRESS NOTE  Dhara Tharaldson [id number] DOB: 1955/06/30 DOA: 07/30/2015 PCP: Merrilee Seashore, MD  Interim summary  Erin Santiago is a 60 y.o. 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 Patient is status post chemotherapy and radiation to the pelvis and back and was due to start chemotherapy on the day of admission due to progression of disease involving the left abdominal pain. Presented to the ED with complaints of 1-2 day history of diffuse abdominal pain. CT abdomen and pelvis showed bowel perforation. Large 9 x 4.5 cm complex right lower quadrant and pelvic abscess containing a small amount of contrast suggesting communication with the distal ileum. General surgery consulted recommended initially nonoperative percutaneous drainage.She was placed on bowel rest and monitored. Interventional radiology was also consulted for drain placement. Patient had a percutaneous drain placed on 07/31/2015 and was placed empirically on IV Zosyn from admission. Wound/abscess cultures grew out Escherichia coli. She had been made NPO with administration of TPN for the course of this hospitalization. Her diet was gradually advanced as she was monitored off of IV antibiotic therapy. Initially she seemed to do well however later developed increasing abdominal pain along with spiking temperatures for which she was restarted on IV Zosyn and again made nothing by mouth. TPN was restarted. In the interim she has had multiple follow-up CT scans. Her last CT scan on 08/22/2015 showed mildly increased size in the complex multilocular right pelvic abscess. On 08/25/2015 she underwent repositioning of her drain Postprocedure she spiked a temperature of 103. She remains on TPN.    HPI/Subjective: Patient sitting at bedside chair-feel little better today. Overnight  had a MAXIMUM TEMPERATURE of 103. Now on TPN  Assessment/Plan: #1 Bowel perforation, complex intra-abdominal fluid collection 9 x 4.5 cm right lower quadrant and pelvic -likely due to tumor invasion -Per CT abdomen and pelvis appears that perforation may have walled off and contained.  -S/p RLQ percutaneous drain 07/31/2015 per IR.  Wound/abscess cultures growing Escherichia coli. She has been on IV Zosyn.  -Gen. surgery and interventional radiology following. -On 08/11/2015 repeat CT scan of abdomen and pelvis did not reveal significant change in large pelvic abscess per radiology, measuring 7 x 11.5 cm -3/8 underwent CT guided placed of a 10 Fr drainage catheter placement into the pelvis via the left lower abdomen yielding 220 cc of purulent material.  - She was re-started on IV Zosyn on 08/21/2015 given fever and elevated white count. A repeat CT scan of abdomen and pelvis performed on 08/22/2015 reported by radiology to have a mildly increased size of a complex multiloculated right pelvic abscess. -On 08/22/2015 she reports feeling better, though spiked a fever of 101.9 at 4:40 am. She remains on zosyn  -Still not tolerating PO -On 08/24/2015 she underwent repositioning of drainage tube. Placed on TPN  #2 febrile illness -On 08/21/2015 patient reporting feeling poorly, found to have low-grade temperature 100.8. Labs reviewed all be trend in her white count to 12,700. -Chest x-ray did not reveal developing pneumonia. Urinalysis was unremarkable. -She was started on IV Zosyn. -RN reporting that patient has had several episodes of diarrhea, however, she states this started after drinking contrast.   #3 dehydration/metabolic acidosis -Resolving. She was treated with IV fluids.   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 Patient status post chemotherapy and radiation to the pelvis. Patient was to start chemotherapy on the day of admission due to progression of disease involving the left, iliac adenopathy, 2 masses in the right lower quadrant seen on CT scan on 07/21/2015.   -She had a repeat CT scan on 08/11/2015 that revealed mild progression of abdominal pelvic lymphadenopathy consistent with progression of metastatic disease. -Radiology also revealed increase in mild hydronephrosis due to metastatic lymphadenopathy. Renal function remains stable.  -Dr. Marko Plume of medical oncology following, overall prognosis is poor  prophylaxis SCDs for DVT prophylaxis.  Code Status: Full Family Communication: Updated patient. No family at bedside. Disposition Plan: Plan to have drain repositioned today    Consultants:  Gen. surgery: Dr. Excell Seltzer 07/30/2015   oncology: Dr. Marko Plume 07/31/2015  Interventional radiology  Procedures:  CT abdomen and pelvis 07/30/2015  Acute abdominal series 07/30/2015  Right lower quadrant abdominal abscess drain with CT 07/31/2015 for Dr. Annamaria Boots  Antibiotics:  IV Zosyn 07/30/2015   Objective: Filed Vitals:   08/25/15 0644 08/25/15 1100  BP: 118/55 116/51  Pulse: 74 81  Temp: 98.4 F (36.9 C) 98.8 F (37.1 C)  Resp: 20 18    Intake/Output Summary (Last 24 hours) at 08/25/15 1430 Last data filed at 08/25/15 0800  Gross per 24 hour  Intake 8596.75 ml  Output    242 ml  Net 8354.75 ml   Filed Weights   08/17/15 0500 08/22/15 0650 08/23/15 0602  Weight: 97.478 kg (214 lb 14.4 oz) 98.34 kg (216 lb 12.8 oz) 98.34 kg (216 lb 12.8 oz)    Exam:   General:   She seems a little better today compared to yesterday  cardiovascular: RRR  Respiratory: Normal respiratory effort, lungs are clear to auscultation  bilaterally  Abdomen: Soft, nontender, nondistended. Drain in LLQ- there is mild erythema surrounding drain. Patient reporting pain to palpation over this region.   Musculoskeletal: No clubbing cyanosis or edema.   Data Reviewed: Basic Metabolic Panel:  Recent Labs Lab 08/20/15 0534 08/22/15 0355 08/23/15 0430 08/24/15 0445 08/24/15 1500 08/25/15 0603  NA 138 138 140 142  --  142  K 3.5 3.0* 3.1* 2.9*  --  3.3*  CL 103 103 107 109  --  108  CO2 26 23 23 25   --  25  GLUCOSE 108* 127* 162* 97  --  134*  BUN 15 10 8 7   --  7  CREATININE 0.58 0.56 0.46 0.37*  --  0.54  CALCIUM 8.8* 8.4* 8.5* 8.3*  --  8.4*  MG 1.9  --   --   --  1.6* 1.8  PHOS 3.3  --   --   --   --  2.1*   Liver Function Tests:  Recent Labs Lab 08/20/15 0534 08/25/15 0603  AST 19 13*  ALT 21 12*  ALKPHOS 140* 91  BILITOT 0.6 0.9  PROT 7.1 6.0*  ALBUMIN 2.9* 2.5*   No results for input(s): LIPASE, AMYLASE in the last 168 hours. No results for input(s): AMMONIA in the last 168 hours. CBC:  Recent Labs Lab 08/21/15 0509 08/22/15 0355 08/23/15 0430 08/24/15 0445 08/25/15 0603  WBC 12.7* 13.9* 12.6* 11.8* 11.2*  NEUTROABS  --   --   --   --  9.1*  HGB 9.8*  8.5* 8.7* 7.6* 8.0*  HCT 30.6* 27.1* 27.3* 24.1* 25.1*  MCV 83.4 87.1 86.4 87.0 86.9  PLT 449* 419* 443* 419* 455*   Cardiac Enzymes: No results for input(s): CKTOTAL, CKMB, CKMBINDEX, TROPONINI in the last 168 hours. BNP (last 3 results) No results for input(s): BNP in the last 8760 hours.  ProBNP (last 3 results) No results for input(s): PROBNP in the last 8760 hours.  CBG:  Recent Labs Lab 08/20/15 0602 08/20/15 1159 08/25/15 0018 08/25/15 0641 08/25/15 1217  GLUCAP 97 118* 110* 112* 112*    Recent Results (from the past 240 hour(s))  C difficile quick scan w PCR reflex     Status: None   Collection Time: 08/22/15 10:14 PM  Result Value Ref Range Status   C Diff antigen NEGATIVE NEGATIVE Final   C Diff toxin NEGATIVE  NEGATIVE Final   C Diff interpretation Negative for toxigenic C. difficile  Final  Urine culture     Status: None   Collection Time: 08/23/15  1:30 AM  Result Value Ref Range Status   Specimen Description URINE, CLEAN CATCH  Final   Special Requests NONE  Final   Culture   Final    8,000 COLONIES/mL INSIGNIFICANT GROWTH Performed at [facility]    Report Status 08/24/2015 FINAL  Final     Studies: Ir Catheter Tube Change  08/24/2015  INDICATION: 60 year old female with a history of endometrial sarcoma with diagnosis February 2016. She has had recent abscess drainage with percutaneous drainage performed from the right lateral pelvis/ abdomen 07/31/2015, and then from the left anterior 08/12/2015. EXAM: SINUS TRACT INJECTION / FISTULOGRAM COMPARISON:  Multiple prior CT, most recent 08/22/2015 MEDICATIONS: No additional medications ANESTHESIA/SEDATION: None COMPLICATIONS: None PROCEDURE: Informed written consent was obtained from the patient after a thorough discussion of the procedural risks, benefits and alternatives. All questions were addressed. Maximal Sterile Barrier Technique was utilized including caps, mask, sterile gowns, sterile gloves, sterile drape, hand hygiene and skin antiseptic. A timeout was performed prior to the initiation of the procedure. Scout images were performed. The skin and subcutaneous tissues surrounding the indwelling drain were generously infiltrated 1% lidocaine for local anesthesia. The indwelling drain was injected with contrast with images stored. The catheter was ligated and a 035 Amplatz wire was advanced into abdominal collection. Using modified Seldinger technique, the indwelling drain was removed any new 14 French pigtail catheter was advanced into the collection. Pigtail catheter was formed, and 30 cc of frankly feculent material was aspirated. Approximately 180 cc of sterile saline was used to your a gate the tube in the cavity, with aspiration of the  infused fluid. Catheter was sutured in position and attached to gravity drainage. Patient tolerated the procedure well and remained hemodynamically stable throughout. No complications were encountered and no significant blood loss encountered. 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 Residual abscess cavity. New 14 French pigtail catheter into the right abdomen. 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 The drain was then exchanged for a 14 French pigtail catheter. Signed, Dulcy Fanny. Earleen Newport, DO [facility] Radiology Electronically Signed   By: Corrie Mckusick D.O.   On: 08/24/2015 16:26   Dg Abd Portable 1v  08/24/2015  CLINICAL DATA:  Sudden nausea and vomiting, concern clinically for sepsis EXAM: PORTABLE ABDOMEN - 1 VIEW COMPARISON:  None. FINDINGS: Pigtail catheter  right lower quadrant again noted. Gas throughout small large bowel. IMPRESSION: The top catheter again noted. No specific radiographic abnormalities. Electronically Signed   By: Skipper Cliche M.D.   On: 08/24/2015 17:46    Scheduled Meds: . acetaminophen  1,000 mg Oral TID  . antiseptic oral rinse  7 mL Mouth Rinse BID  . feeding supplement  1 Container Oral TID PC & HS  . fluticasone  1 spray Each Nare Daily  . insulin aspart  0-15 Units Subcutaneous Q6H  . nystatin  5 mL Oral QID  . nystatin   Topical BID  . piperacillin-tazobactam (ZOSYN)  IV  3.375 g Intravenous 3 times per day  . potassium phosphate IVPB (mmol)  10 mmol Intravenous Once  . promethazine  12.5 mg Intravenous Once  . saccharomyces boulardii  250 mg Oral BID  . sodium chloride flush  3 mL Intravenous Q12H   Continuous Infusions: . Marland KitchenTPN (CLINIMIX-E) Adult     And  . fat emulsion    . Marland KitchenTPN (CLINIMIX-E) Adult 30 mL/hr at 08/24/15 2157   And  . fat emulsion 250  mL (08/24/15 2204)  . 0.9 % sodium chloride with kcl 100 mL/hr at 08/24/15 1358    Time spent: 15 mins    Kelvin Cellar, MD [facility] Pager [email or web address], password [facility] 08/25/2015, 2:30 PM  LOS: 26 days

## 2015-08-25 NOTE — Progress Notes (Signed)
Patient ID: Erin Santiago, female   DOB: 02-06-1956, 60 y.o.   MRN: [id number]    Referring Physician(s): Stark Klein   Supervising Physician: Jacqulynn Cadet  Chief Complaint: RLQ abdominal abscess  Subjective: Pt worried about her fever yesterday, but WBC improved today.  Repeat x-rays this morning are normal.   Allergies: Review of patient's allergies indicates no known allergies.  Medications: Prior to Admission medications   Medication Sig Start Date End Date Taking? Authorizing Provider  calcium carbonate (TUMS - DOSED IN MG ELEMENTAL CALCIUM) 500 MG chewable tablet Chew 2 tablets by mouth daily.    Yes Historical Provider, MD  dexamethasone (DECADRON) 4 MG tablet Take 2 tablets by mouth once a day on the day after chemotherapy and then take 2 tablets two times a day for 2 days. Take with food. 07/27/15  Yes Lennis Marion Downer, MD  docusate sodium (COLACE) 100 MG capsule Take 100 mg by mouth 2 (two) times daily as needed for mild constipation. Reported on 05/28/2015 07/17/14  Yes Historical Provider, MD  hydrocortisone 2.5 % cream Apply 1 application topically as needed (itch/psoriasis.). Reported on 07/17/2015 05/19/14  Yes Historical Provider, MD  lidocaine-prilocaine (EMLA) cream Apply to Porta-cath 1-2 hrs prior to access. Cover with [employer]. 08/18/14  Yes Lennis Marion Downer, MD  LORazepam (ATIVAN) 1 MG tablet Place 1/2 to 1 tablet under the tongue or swallow every 6 hours as needed for nausea 07/22/15  Yes Lennis P Livesay, MD  meloxicam (MOBIC) 15 MG tablet Take 15 mg by mouth daily.  06/09/14  Yes Historical Provider, MD  ondansetron (ZOFRAN) 8 MG tablet Take 1 tablet (8 mg total) by mouth every 8 (eight) hours as needed for nausea or vomiting. 07/22/15  Yes Lennis Marion Downer, MD  prochlorperazine (COMPAZINE) 10 MG tablet Take 1 tablet (10 mg total) by mouth every 6 (six) hours as needed (Nausea or vomiting). 07/27/15  Yes Lennis Marion Downer, MD  Psyllium 28.3 % POWD Take 1 packet by  mouth daily as needed (for fiber). Reported on 05/26/2015   Yes Historical Provider, MD  Resveratrol 250 MG CAPS Take 250 mg by mouth 2 (two) times daily. Reported on 05/26/2015   Yes Historical Provider, MD  ferrous fumarate (HEMOCYTE) 325 (106 FE) MG TABS tablet Take 1 tab daily on an empty stomach with OJ or Vitamin C 500 mg Patient not taking: Reported on 07/09/2015 09/15/14   Lennis Marion Downer, MD  sucralfate (CARAFATE) 1 GM/10ML suspension Take 10 mLs (1 g total) by mouth 4 (four) times daily -  with meals and at bedtime. Patient not taking: Reported on 07/30/2015 05/20/15   [name], MD    Vital Signs: BP 118/55 mmHg  Pulse 74  Temp(Src) 98.4 F (36.9 C) (Oral)  Resp 20  Ht 5' (1.524 m)  Wt 216 lb 12.8 oz (98.34 kg)  BMI 42.34 kg/m2  SpO2 96%  LMP 02/04/2014  Physical Exam: Abd: soft, mildly tender around drain, drain with some bloody feculent output, 280cc since drain replaced. Drain site is clean, dry, no erythema.  Imaging: Dg Chest 2 View  08/21/2015  CLINICAL DATA:  Fever.  History of small bowel leak. EXAM: CHEST - 2 VIEW COMPARISON:  Abdominal series on 07/30/2015 FINDINGS: The heart size and mediastinal contours are within normal limits. Stable appearance of Port-A-Cath. Improved aeration at both lung bases with mild residual atelectasis remaining bilaterally. There is no evidence of pulmonary edema, consolidation, pneumothorax, nodule or pleural fluid. The visualized skeletal structures  are unremarkable. IMPRESSION: Improved bilateral aeration with residual bibasilar atelectasis remaining. Electronically Signed   By: Aletta Edouard M.D.   On: 08/21/2015 14:21   Ct Abdomen Pelvis W Contrast  08/22/2015  CLINICAL DATA:  60 year old female inpatient with metastatic endometrial sarcoma status post hysterectomy in February 2016 status post radiation therapy and chemotherapy, with progression of metastatic disease complicated by bowel perforation requiring percutaneous drainage  x 2. Low-grade fever. Persistent fluid draining from percutaneous tubes. EXAM: CT ABDOMEN AND PELVIS WITH CONTRAST TECHNIQUE: Multidetector CT imaging of the abdomen and pelvis was performed using the standard protocol following bolus administration of intravenous contrast. CONTRAST:  10m OMNIPAQUE IOHEXOL 300 MG/ML SOLN, 1030mOMNIPAQUE IOHEXOL 300 MG/ML SOLN COMPARISON:  08/18/2015 CT abdomen/pelvis . FINDINGS: Lower chest: Peripheral left upper lobe 3 mm pulmonary nodule (series 4/image 3) is stable. Mild bibasilar atelectasis. Partially visualized is the tip of a superior approach central venous catheter in the lower third of the superior vena cava. Hepatobiliary: Normal liver with no liver mass. Normal gallbladder with no radiopaque cholelithiasis. No biliary ductal dilatation. Pancreas: Normal, with no mass or duct dilation. Spleen: Normal size. No mass. Adrenals/Urinary Tract: Normal adrenals. No right hydronephrosis. Minimal left hydronephrosis, decreased. No renal mass. Collapsed and grossly normal bladder. Stomach/Bowel: Stable postsurgical changes in the stomach from prior sleeve surgery, with no gross gastric abnormality. No significantly dilated small bowel loops. Oral contrast progresses to the colon. No focal small bowel caliber transition. Reactive wall thickening within several distal ileal small bowel loops in the right lower quadrant. Normal appendix. Minimal diverticulosis in the sigmoid colon, with no large bowel wall thickening. Vascular/Lymphatic: Normal caliber abdominal aorta. Patent portal, splenic, hepatic and renal veins. Enlarged 3.7 cm aortocaval node (series 7/ image 55), previously 3.5 cm, slightly increased, with possible invasion of the third portion of the duodenum by this node. Enlarged clustered left common iliac nodes, largest 2.2 cm, not appreciably changed. Enlarged 2.3 cm irregular left internal iliac node (series 2/ image 81), not appreciably changed. Enlarged 1.9 cm right  external iliac node (series 2/image 70), not appreciably changed. Reproductive: Status post hysterectomy.  No adnexal mass. Other: Interval removal of the left ventral pelvic percutaneous drainage catheter. Tiny residual thick walled 1.8 x 1.6 cm collection at the right vaginal cuff margin (series 2/ image 83), previously 1.9 x 1.7 cm, not appreciably changed. Stable position of ventral right pelvic percutaneous drainage catheter with the distal pigtail tip within the right anterior pelvis. There is a complex multilocular gas containing thick walled abscess in the right lower quadrant, with a dominant 5.6 x 4.8 cm block ill superior to the drainage catheter (series 2/ image 73), previously 4.9 x 4.3 cm, mildly increased and a 4.6 x 4.0 cm o'clock ill posterior/ inferior to the drainage catheter tip (series 2/ image 79), previously 3.4 x 2.5 cm, mildly increased. No oral contrast is seen within the pelvic fluid collections. No free intraperitoneal air. No new fluid collections. Musculoskeletal: No aggressive appearing focal osseous lesions. Moderate degenerative changes in the visualized thoracolumbar spine. IMPRESSION: 1. Mildly increased size of complex multilocular right pelvic abscess as described. 2. Tiny residual abscess at the right vaginal cuff at the previous site of the removed left percutaneous drainage catheter, unchanged. 3. Stable to slightly increased retroperitoneal and bilateral pelvic nodal metastases. 4. Minimal left hydronephrosis, decreased, secondary to infiltrate left pelvic side wall adenopathy. 5. No evidence of bowel obstruction. Mild reactive wall thickening in the distal small bowel. No oral contrast  accumulates within the pelvic fluid collections. Electronically Signed   By: Ilona Sorrel M.D.   On: 08/22/2015 12:07   Ir Catheter Tube Change  08/24/2015  INDICATION: 60 year old female with a history of endometrial sarcoma with diagnosis February 2016. She has had recent abscess drainage  with percutaneous drainage performed from the right lateral pelvis/ abdomen 07/31/2015, and then from the left anterior 08/12/2015. EXAM: SINUS TRACT INJECTION / FISTULOGRAM COMPARISON:  Multiple prior CT, most recent 08/22/2015 MEDICATIONS: No additional medications ANESTHESIA/SEDATION: None COMPLICATIONS: None PROCEDURE: Informed written consent was obtained from the patient after a thorough discussion of the procedural risks, benefits and alternatives. All questions were addressed. Maximal Sterile Barrier Technique was utilized including caps, mask, sterile gowns, sterile gloves, sterile drape, hand hygiene and skin antiseptic. A timeout was performed prior to the initiation of the procedure. Scout images were performed. The skin and subcutaneous tissues surrounding the indwelling drain were generously infiltrated 1% lidocaine for local anesthesia. The indwelling drain was injected with contrast with images stored. The catheter was ligated and a 035 Amplatz wire was advanced into abdominal collection. Using modified Seldinger technique, the indwelling drain was removed any new 14 French pigtail catheter was advanced into the collection. Pigtail catheter was formed, and 30 cc of frankly feculent material was aspirated. Approximately 180 cc of sterile saline was used to your a gate the tube in the cavity, with aspiration of the infused fluid. Catheter was sutured in position and attached to gravity drainage. Patient tolerated the procedure well and remained hemodynamically stable throughout. No complications were encountered and no significant blood loss encountered. 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 Residual abscess cavity. New 14 French pigtail catheter into the right abdomen. 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 The drain was then exchanged for a  14 French pigtail catheter. Signed, Dulcy Fanny. Earleen Newport, DO [facility] Radiology Electronically Signed   By: Corrie Mckusick D.O.   On: 08/24/2015 16:26   Dg Abd Portable 1v  08/24/2015  CLINICAL DATA:  Sudden nausea and vomiting, concern clinically for sepsis EXAM: PORTABLE ABDOMEN - 1 VIEW COMPARISON:  None. FINDINGS: Pigtail catheter right lower quadrant again noted. Gas throughout small large bowel. IMPRESSION: The top catheter again noted. No specific radiographic abnormalities. Electronically Signed   By: Skipper Cliche M.D.   On: 08/24/2015 17:46    Labs:  CBC:  Recent Labs  08/22/15 0355 08/23/15 0430 08/24/15 0445 08/25/15 0603  WBC 13.9* 12.6* 11.8* 11.2*  HGB 8.5* 8.7* 7.6* 8.0*  HCT 27.1* 27.3* 24.1* 25.1*  PLT 419* 443* 419* 455*    COAGS:  Recent Labs  08/26/14 1300 07/30/15 2242 07/31/15 0428 08/12/15 0539  INR 0.95 1.49 1.41 1.35  APTT 27  --  32  --     BMP:  Recent Labs  08/22/15 0355 08/23/15 0430 08/24/15 0445 08/25/15 0603  NA 138 140 142 142  K 3.0* 3.1* 2.9* 3.3*  CL 103 107 109 108  CO2 _0 GLUCOSE 127* 162* 97 134*  BUN _1 CALCIUM 8.4* 8.5* 8.3* 8.4*  CREATININE 0.56 0.46 0.37* 0.54  GFRNONAA >60 >60 >60 >60  GFRAA >60 >60 >60 >60    LIVER FUNCTION TESTS:  Recent Labs  08/13/15 0822 08/17/15 0532 08/20/15 0534 08/25/15 0603  BILITOT 1.0 0.7 0.6 0.9  AST 13* 27 19 13*  ALT 11* 25 21 12*  ALKPHOS 101 184* 140* 91  PROT 7.1 6.9 7.1 6.0*  ALBUMIN 2.7* 2.8* 2.9* 2.5*    Assessment and Plan: Met endom ca; s/p drainage of RLQ abd abscess 2/24, repositioning 3/21 Earleen Newport - fever yesterday likely secondary to drain repositioning.  She is afebrile today and her WBC is trending down. 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 -cont drain with irrigations, further plans per surgery.  Electronically Signed: Henreitta Cea 08/25/2015, 9:31 AM   I  spent a total of 15 Minutes at the the patient's bedside AND on the patient's hospital floor or unit, greater than 50% of which was counseling/coordinating care for abdominal abscess

## 2015-08-25 NOTE — Progress Notes (Signed)
PARENTERAL NUTRITION CONSULT NOTE -   Pharmacy Consult for TPN Indication: Perforated bowel; intolerant to enteral feeds  No Known Allergies  Patient Measurements: Height: 5' (152.4 cm) Weight: 216 lb 12.8 oz (98.34 kg) IBW/kg (Calculated) : 45.5 Adjusted Body Weight: 58.5 kg Usual Weight: ~98kg  Vital Signs: Temp: 98.4 F (36.9 C) (03/21 0644) Temp Source: Oral (03/21 0644) BP: 118/55 mmHg (03/21 0644) Pulse Rate: 74 (03/21 0644) Intake/Output from previous day: 03/20 0701 - 03/21 0700 In: 8586.8 [IV Piggyback:550; [id number] Out: 283 [Urine:2; Drains:280; Stool:1] Intake/Output from this shift:    Labs:  Recent Labs  08/23/15 0430 08/24/15 0445 08/25/15 0603  WBC 12.6* 11.8* 11.2*  HGB 8.7* 7.6* 8.0*  HCT 27.3* 24.1* 25.1*  PLT 443* 419* 455*    Recent Labs  08/23/15 0430 08/24/15 0445 08/24/15 1500 08/25/15 0603  NA 140 142  --  142  K 3.1* 2.9*  --  3.3*  CL 107 109  --  108  CO2 23 25  --  25  GLUCOSE 162* 97  --  134*  BUN 8 7  --  7  CREATININE 0.46 0.37*  --  0.54  CALCIUM 8.5* 8.3*  --  8.4*  MG  --   --  1.6* 1.8  PHOS  --   --   --  2.1*  PROT  --   --   --  6.0*  ALBUMIN  --   --   --  2.5*  AST  --   --   --  13*  ALT  --   --   --  12*  ALKPHOS  --   --   --  91  BILITOT  --   --   --  0.9  PREALBUMIN  --   --  9.3*  --    Estimated Creatinine Clearance: 78.6 mL/min (by C-G formula based on Cr of 0.54).    Recent Labs  08/25/15 0018 08/25/15 0641  GLUCAP 110* 112*   Medical History: Past Medical History  Diagnosis Date  . Abnormal uterine bleeding   . Anemia   . Fibroid   . Heart murmur     under 5 yrs of age  . Cancer ([place]) dx'd 07/2014    endometrial stromal sarcoma  . Radiation 12/01/14-01/05/15    pelvis 45 gray  . Radiation 05/18/2015-06/05/15    T4 thoracic spine area 35 gray   Insulin Requirements during previous day:  - none - no hx DM  Current Nutrition: back to [facility] 3/20 + TPN (pt did not tolerate soft  diet) - Resource Breeze QID, pt states that she can drink 4-5 of these without problems daily. Each of these provides 9g protein and 250 kcal.  Total daily nutrition from 4 containers = 36g protein and 1000 kcal.  IVF: NS with 40 mEq KCl at 100 ml/hr  Central access: implanted port 11/19/14 TPN start date: 08/05/15  ASSESSMENT  HPI: 60 yo F admitted 2/23 with abdominal pain and noted to have perforated bowel.  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 She completed a course of Zosyn for IAI, which was resumed later this admission on 3/17 when pt became febrile again.  Patient was tolerating oral diet advancements so TPN was discontinued in hopes that this would continue to improve her appetite; however, pt started having continued nausea/vomiting and intolerance to diet so TPN was resumed.  Significant events:  3/1: IR consulted for drain adjustment (bulb changed) 3/3: TPN advanced to goal rate 3/5: abscess drainage decreased, 85 ml/24 hr, + BM's 3/6: patient reported BM this morning and eating about ~75% of meals (not documented in [facility]), Resource started 3/7 abd CT: no signif change in pelvic abscess, metastatic lymphadenopathy in the left lower pelvis. 3/8 drainage catheter in pelvis placed by IR 3/12 pt feels full, attributes to TPN. Decrease TPN  3/13 pt feels better re: fullness but having more abd pain, vomited x 1 this am 3/16 Patient tolerating diet.  TPN stopped.  3/20 Restarting TPN- patient not eating.  Drain exchanged/upsized - findings of residual abscess cavity with aspiration of ~40 cc of feculent fluid.  Today:   Glucose (goal cbgs <150): all cbgs <150  Electrolytes: K+ 3.3, Phos 2.1 - will need replacement today.   CorrCa wnl.    Renal:  stable at patient's baseline  LFTs: WNL  TGs: 188 (3/2), 115 (3/6), 135 (3/13)  Prealbumin: 6.4 (3/2), 13.7 (3/6), 20.5 (3/13), 9.3 (3/20)  NUTRITIONAL GOALS                                                                                             RD recs (as of 3/8 are below):  RD has been re-consulted today, waiting on updated recommendations and if using TBW is appropriate for nutritional estimates in this obese, non-critically ill patient.  Attempted paging for discussion.  Kcal: 1950-2150 (20-22 kcal/kg, using TBW) Protein: 120-140 grams of protein (1.25-1.45 grams/kg, using TBW) Fluid: 2 L/day  Patient receives 36g protein and 1000 kcal from [employer] daily.  Clinimix 5/15 at 55 ml/hr + 20% lipid emulsion at 5 ml/hr will provide 66g protein and 1177 kcal daily.  Total daily nutritional intake = 102g protein, 2177 kcal.  PLAN                                                                                                                         This morning:  KPhos 10 mmol/250 mL D5W IV x 1  At 1800 today:  Advance Clinimix E 5/15 to 73ml/hr.  Would like to cycle this if patient will require TPN at discharge.  20% fat emulsion at 5 ml/hr.  TPN to contain standard multivitamins and trace elements.  Continue moderate SSI & CBGs q6h  TPN lab panels on Mondays & Thursdays.  BMET, Mag, Phos in AM.  F/u daily.  Hershal Coria, PharmD, BCPS Pager: [phone] 08/25/2015 8:02 AM

## 2015-08-26 DIAGNOSIS — E46 Unspecified protein-calorie malnutrition: Secondary | ICD-10-CM

## 2015-08-26 DIAGNOSIS — K632 Fistula of intestine: Secondary | ICD-10-CM

## 2015-08-26 LAB — BASIC METABOLIC PANEL
Anion gap: 8 (ref 5–15)
BUN: 7 mg/dL (ref 6–20)
CALCIUM: 8.2 mg/dL — AB (ref 8.9–10.3)
CHLORIDE: 107 mmol/L (ref 101–111)
CO2: 24 mmol/L (ref 22–32)
CREATININE: 0.47 mg/dL (ref 0.44–1.00)
Glucose, Bld: 114 mg/dL — ABNORMAL HIGH (ref 65–99)
Potassium: 3.9 mmol/L (ref 3.5–5.1)
SODIUM: 139 mmol/L (ref 135–145)

## 2015-08-26 LAB — PHOSPHORUS: PHOSPHORUS: 2.8 mg/dL (ref 2.5–4.6)

## 2015-08-26 LAB — MAGNESIUM: Magnesium: 1.7 mg/dL (ref 1.7–2.4)

## 2015-08-26 LAB — CBC
HCT: 23.9 % — ABNORMAL LOW (ref 36.0–46.0)
Hemoglobin: 7.7 g/dL — ABNORMAL LOW (ref 12.0–15.0)
MCH: 27.9 pg (ref 26.0–34.0)
MCHC: 32.2 g/dL (ref 30.0–36.0)
MCV: 86.6 fL (ref 78.0–100.0)
PLATELETS: 434 10*3/uL — AB (ref 150–400)
RBC: 2.76 MIL/uL — AB (ref 3.87–5.11)
RDW: 16.2 % — ABNORMAL HIGH (ref 11.5–15.5)
WBC: 10.5 10*3/uL (ref 4.0–10.5)

## 2015-08-26 LAB — GLUCOSE, CAPILLARY
GLUCOSE-CAPILLARY: 110 mg/dL — AB (ref 65–99)
GLUCOSE-CAPILLARY: 115 mg/dL — AB (ref 65–99)
GLUCOSE-CAPILLARY: 105 mg/dL — AB (ref 65–99)
Glucose-Capillary: 98 mg/dL (ref 65–99)

## 2015-08-26 LAB — PREPARE RBC (CROSSMATCH)

## 2015-08-26 MED ORDER — MAGNESIUM SULFATE IN D5W 10-5 MG/ML-% IV SOLN
1.0000 g | Freq: Once | INTRAVENOUS | Status: AC
  Administered 2015-08-26: 1 g via INTRAVENOUS
  Filled 2015-08-26: qty 100

## 2015-08-26 MED ORDER — ACETAMINOPHEN 325 MG PO TABS
650.0000 mg | ORAL_TABLET | Freq: Once | ORAL | Status: AC
  Administered 2015-08-26: 650 mg via ORAL
  Filled 2015-08-26: qty 2

## 2015-08-26 MED ORDER — FAT EMULSION 20 % IV EMUL
126.0000 mL | INTRAVENOUS | Status: AC
  Administered 2015-08-26: 126 mL via INTRAVENOUS
  Filled 2015-08-26: qty 250

## 2015-08-26 MED ORDER — SODIUM CHLORIDE 0.9 % IV SOLN
INTRAVENOUS | Status: AC
  Filled 2015-08-26: qty 1000

## 2015-08-26 MED ORDER — INSULIN ASPART 100 UNIT/ML ~~LOC~~ SOLN: 0.0000 [IU] | Freq: Three times a day (TID) | SUBCUTANEOUS | Status: DC

## 2015-08-26 MED ORDER — INSULIN ASPART 100 UNIT/ML ~~LOC~~ SOLN: 0.0000 [IU] | SUBCUTANEOUS | Status: DC

## 2015-08-26 MED ORDER — SODIUM CHLORIDE 0.9 % IV SOLN: Freq: Once | INTRAVENOUS | Status: DC

## 2015-08-26 MED ORDER — M.V.I. ADULT IV INJ
INJECTION | INTRAVENOUS | Status: AC
  Administered 2015-08-26: 17:00:00 via INTRAVENOUS
  Filled 2015-08-26: qty 1320

## 2015-08-26 NOTE — Consult Note (Signed)
Consult Note: Gyn-Onc  Consult was requested by Dr. Harlow Asa for the evaluation of Erin Santiago 60 y.o. female  CC:  Chief Complaint  Patient presents with  . GI Bleeding  . Cancer    Assessment/Plan:  [name]  is a 60 y.o.  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  [name] has progressive disease in her retroperitoneal nodal basins, and a right pelvic mass that is likely infiltraing into the right colon/cecum (and possibly represents an enlarged metastatic node). She is failing conservative management with percutaneous drainage (controlled fistula) with persistent fevers. Due to her complex abdominal pathology, morbid obesity (BMI 42), and prior radiation to the abdomen, she is not felt to be a good surgical candidate here at [facility]. Any surgical effort would be to perform a diverting procedure, such as ileostomy, and not to resect disease.  Erin Santiago's prognosis is poor from her malignancy, as it is a high grade malignancy for which there are no good systemic treatment options and her disease has proven to be resistant to the optimal therapies (gem/taxotere and radiation). She has measurable disease in the retroperitoneal nodal basins, which is progressing after radiation to these sites, and a T4 metastasis that has been radiated and is stable. Intent of any future oncologic therapy is palliative. I discussed this with Erin Santiago, though I am not certain that this is something that she believes.  She strongly desires transfer to a tertiary center for consideration of surgical intervention with the goal being that this would enable recovery from her infectious state, and attempt to commence second line salvage chemotherapy with Adriamycin and olaratumab. I will contact my colleagues at [facility] in [place] to determine if transfer of care is deemed appropriate.   HPI: Erin Santiago is a 60 year old woman who  is seen in consultation at the request of Dr Marko Plume and Dr Harlow Asa for progressive high grade endometrial stromal sarcoma in the setting of a right colonic/cecal fistula and peritoneal infection. She was initially seen by me in January 2016 for evaluation of abnormal uterine bleeding and failed attempts at office sampling. She underwent a total abdominal hysterectomy, BSO and lymphadenectomy at [facility] with [name] on 07/14/14. Pathology revealed a high grade endometrial stromal sarcoma, stage IIIB 18cm with positive lymphatic involvement. ER/PR receptors were negative. She was dispositioned to receive adjuvant chemotherapy with gemcitabine and docetaxel and adjuvant pelvic and PA radiation.   She commenced front line systemic therapy in March, 2016 but progressed with restaging CT imaging in June 2016 after cycle 4 of Gem/Taxotere ordered to work up progressive lower extremity edema revealing progressive enlargement of pelvic and PA nodes. Of note her chemotherapy treatments were complicated by bone marrow toxicity and admission for neutropenic fever. Chemotherapy was aborted at that time and she went on to receive radiation to the pelvis and the PA nodal regions (45 Gy in 25 fractions between 12/01/14-01/20/15).   She entered a period of surveillance after completing her course of radiation, until, follow-up imaging on 05/07/15 revealed progressive disease in the nodal basins of the pelvic and PA regions, and a new lesion in T4 vertebral body. She received palliative radiation to the thoracic spine metastasis in December 2016.   She was evaluated at [facility] by the sarcoma program for consideration for alternative treatments and for possible enrolment in a MATCH trial, but none were appropriate.   Imaging on 07/21/15 (CT chest/abdo/pelvis) revealed:  Overall there has been interval progression of disease. There has been interval enlargement of left common iliac adenopathy. Additionally, there are 2  masses within the right lower quadrant (5cm) and iliac fossa (2.5cm) which demonstrate significant interval increase in size. Stable appearance of retroperitoneal adenopathy and lytic metastasis within the thoracic spine  She was scheduled to commence second line salvage systemic therapy with Adriamycin and Olaratumab in March, 201, however, developed symptoms of abdominal sepsis in late February 2017 requiring admission to [facility] hospital.   Imaging in that admission showed leak of oral contrast from the right lower abdominal GI structures, and she was felt by the general surgical service to not be a surgical candidate and therefore was managed conservatively with drain placement and IV antibiotics. She continued to spike intermittent fevers over the course of her hospitalization even after placement of a drain which drained feculent material. CT fistulogram through her drain on 08/26/15 showed evidence of communication with the right colon/cecum.  Her performance status prior to this hospitalization was excellent.  Current Meds:  No current facility-administered medications on file prior to encounter.   Current Outpatient Prescriptions on File Prior to Encounter  Medication Sig Dispense Refill  . calcium carbonate (TUMS - DOSED IN MG ELEMENTAL CALCIUM) 500 MG chewable tablet Chew 2 tablets by mouth daily.     Marland Kitchen dexamethasone (DECADRON) 4 MG tablet Take 2 tablets by mouth once a day on the day after chemotherapy and then take 2 tablets two times a day for 2 days. Take with food. 30 tablet 1  . docusate sodium (COLACE) 100 MG capsule Take 100 mg by mouth 2 (two) times daily as needed for mild constipation. Reported on 05/28/2015    . hydrocortisone 2.5 % cream Apply 1 application topically as needed (itch/psoriasis.). Reported on 07/17/2015    . lidocaine-prilocaine (EMLA) cream Apply to Porta-cath 1-2 hrs prior to access. Cover with [employer]. 30 g 1  . LORazepam (ATIVAN) 1 MG tablet Place 1/2 to 1  tablet under the tongue or swallow every 6 hours as needed for nausea 20 tablet 0  . meloxicam (MOBIC) 15 MG tablet Take 15 mg by mouth daily.   3  . ondansetron (ZOFRAN) 8 MG tablet Take 1 tablet (8 mg total) by mouth every 8 (eight) hours as needed for nausea or vomiting. 30 tablet 0  . prochlorperazine (COMPAZINE) 10 MG tablet Take 1 tablet (10 mg total) by mouth every 6 (six) hours as needed (Nausea or vomiting). 30 tablet 1  . Psyllium 28.3 % POWD Take 1 packet by mouth daily as needed (for fiber). Reported on 05/26/2015    . Resveratrol 250 MG CAPS Take 250 mg by mouth 2 (two) times daily. Reported on 05/26/2015    . ferrous fumarate (HEMOCYTE) 325 (106 FE) MG TABS tablet Take 1 tab daily on an empty stomach with OJ or Vitamin C 500 mg (Patient not taking: Reported on 07/09/2015) 30 each 3  . sucralfate (CARAFATE) 1 GM/10ML suspension Take 10 mLs (1 g total) by mouth 4 (four) times daily -  with meals and at bedtime. (Patient not taking: Reported on 07/30/2015) 420 mL 0     Allergy: No Known Allergies  Social Hx:   Social History   Social History  . Marital Status: Single    Spouse Name: N/A  . Number of Children: 0  . Years of Education: N/A   Occupational History  . secretarial    Social History Main Topics  .  Smoking status: Never Smoker   . Smokeless tobacco: Never Used  . Alcohol Use: No  . Drug Use: No  . Sexual Activity: No   Other Topics Concern  . Not on file   Social History Narrative    Past Surgical Hx:  Past Surgical History  Procedure Laterality Date  . Laparoscopic gastric sleeve resection  2011  . Abdominal hysterectomy  07/2014    at [facility]    Past Medical Hx:  Past Medical History  Diagnosis Date  . Abnormal uterine bleeding   . Anemia   . Fibroid   . Heart murmur     under 5 yrs of age  . Cancer ([place]) dx'd 07/2014    endometrial stromal sarcoma  . Radiation 12/01/14-01/05/15    pelvis 45 gray  . Radiation 05/18/2015-06/05/15    T4 thoracic  spine area 35 gray    Past Gynecological History:  G0  Patient's last menstrual period was 02/04/2014.  Family Hx:  Family History  Problem Relation Age of Onset  . Hypertension Mother   . Diabetes Mother   . Thyroid disease Father   . Heart attack Father   . Other Father     enlarged heart  . Non-Hodgkin's lymphoma Mother 69    Review of Systems:  Constitutional  Fatigued, febrile ENT Normal appearing ears and nares bilaterally Skin/Breast  No rash, sores, jaundice, itching, dryness Cardiovascular  No chest pain, shortness of breath, or edema  Pulmonary  No cough or wheeze.  Gastro Intestinal  Stool from abdominal drain Genito Urinary  No frequency, urgency, dysuria, no bleeding Musculo Skeletal  No myalgia, arthralgia, joint swelling or pain  Neurologic  No weakness, numbness, change in gait,  Psychology  No depression, anxiety, insomnia.   Vitals:  Blood pressure 149/79, pulse 86, temperature 97.6 F (36.4 C), temperature source Oral, resp. rate 18, height 5' (1.524 m), weight 216 lb 12.8 oz (98.34 kg), last menstrual period 02/04/2014, SpO2 99 %.  Physical Exam: (from 08/21/15) WD in NAD Neck  Supple NROM, without any enlargements.  Lymph Node Survey No cervical supraclavicular or inguinal adenopathy Cardiovascular  tachy Lungs  Clear to auscultation bilateraly, without wheezes/crackles/rhonchi. Good air movement.  Skin  No rash/lesions/breakdown  Psychiatry  Alert and oriented to person, place, and time  Abdomen  Normoactive bowel sounds, abdomen soft, non-tender and obese without evidence of hernia. Drain with feculent material from RLQ Back No CVA tenderness Genito Urinary  Vulva/vagina:deferred Rectal  deferred Extremities  Bilateral edema   Donaciano Eva, MD  08/26/2015, 5:40 PM

## 2015-08-26 NOTE — Progress Notes (Signed)
Patient ID: Erin Santiago, female   DOB: 29-Mar-1956, 60 y.o.   MRN: [id number]    Referring Physician(s): Stark Klein   Supervising Physician: Jacqulynn Cadet  Chief Complaint: RLQ abdominal abscess  Subjective: Pt worried about her fever yesterday, but WBC improved today.  Repeat x-rays this morning are normal.   Allergies: Review of patient's allergies indicates no known allergies.  Medications:  Current facility-administered medications:  .  0.9 %  sodium chloride infusion, , Intravenous, Once, Lennis P Livesay, MD .  acetaminophen (TYLENOL) tablet 1,000 mg, 1,000 mg, Oral, TID, Michael Boston, MD, 1,000 mg at 08/26/15 1053 .  acetaminophen (TYLENOL) tablet 650 mg, 650 mg, Oral, Once, Lennis P Livesay, MD .  albuterol (PROVENTIL) (2.5 MG/3ML) 0.083% nebulizer solution 2.5 mg, 2.5 mg, Nebulization, Q2H PRN, Irine Seal V, MD .  antiseptic oral rinse (CPC / CETYLPYRIDINIUM CHLORIDE 0.05%) solution 7 mL, 7 mL, Mouth Rinse, BID, Eugenie Filler, MD, 7 mL at 08/26/15 1055 .  bisacodyl (DULCOLAX) suppository 10 mg, 10 mg, Rectal, Daily PRN, Irine Seal V, MD .  diclofenac sodium (VOLTAREN) 1 % transdermal gel 2 g, 2 g, Topical, TID PRN, Domenic Polite, MD, 2 g at 08/25/15 2239 .  TPN (CLINIMIX-E) Adult, , Intravenous, Continuous TPN, Last Rate: 55 mL/hr at 08/25/15 1734 **AND** fat emulsion 20 % infusion 120 mL, 120 mL, Intravenous, Continuous TPN, Dara Hoyer, RPH, Last Rate: 5 mL/hr at 08/25/15 1733, 120 mL at 08/25/15 1733 .  TPN (CLINIMIX-E) Adult, , Intravenous, Cyclic-See Admin Instructions **AND** fat emulsion 20 % infusion 126 mL, 126 mL, Intravenous, Cyclic-See Admin Instructions, Dara Hoyer, RPH .  feeding supplement (BOOST / RESOURCE BREEZE) liquid 1 Container, 1 Container, Oral, TID PC & HS, Lennis Marion Downer, MD, 1 Container at 08/26/15 1055 .  fluticasone (FLONASE) 50 MCG/ACT nasal spray 1 spray, 1 spray, Each Nare, Daily, Kelvin Cellar, MD, 1 spray at  08/24/15 4128 .  hydrocortisone cream 1 %, , Topical, PRN, Irine Seal V, MD .  insulin aspart (novoLOG) injection 0-15 Units, 0-15 Units, Subcutaneous, 4 times per day, Dara Hoyer, RPH, 0 Units at 08/26/15 1157 .  iohexol (OMNIPAQUE) 300 MG/ML solution 50 mL, 50 mL, Oral, Once PRN, Milton Ferguson, MD, 5 mL at 08/24/15 1553 .  iohexol (OMNIPAQUE) 300 MG/ML solution 50 mL, 50 mL, Other, Once PRN, Corrie Mckusick, DO .  LORazepam (ATIVAN) injection 0.5 mg, 0.5 mg, Intravenous, Q6H PRN, Irine Seal V, MD, 0.5 mg at 08/14/15 0012 .  methocarbamol (ROBAXIN) 1,000 mg in dextrose 5 % 50 mL IVPB, 1,000 mg, Intravenous, Q6H PRN, Michael Boston, MD .  morphine 2 MG/ML injection 1-2 mg, 1-2 mg, Intravenous, Q2H PRN, Lennis P Livesay, MD, 1 mg at 08/04/15 1001 .  nystatin (MYCOSTATIN) 100000 UNIT/ML suspension 500,000 Units, 5 mL, Oral, QID, Lennis P Marko Plume, MD, 500,000 Units at 08/26/15 1053 .  nystatin (MYCOSTATIN/NYSTOP) topical powder, , Topical, BID, Lennis P Livesay, MD .  ondansetron (ZOFRAN) injection 4 mg, 4 mg, Intravenous, Q6H PRN, Earnstine Regal, PA-C, 4 mg at 08/24/15 1700 .  ondansetron (ZOFRAN) tablet 4 mg, 4 mg, Oral, Q6H PRN, 4 mg at 08/24/15 0808 **OR** [DISCONTINUED] ondansetron (ZOFRAN) 8 mg in sodium chloride 0.9 % 50 mL IVPB, 8 mg, Intravenous, Q6H PRN, Gordy Levan, MD, 8 mg at 08/04/15 1107 .  oxyCODONE (Oxy IR/ROXICODONE) immediate release tablet 5-10 mg, 5-10 mg, Oral, Q4H PRN, Michael Boston, MD, 5 mg at 08/26/15 0358 .  piperacillin-tazobactam (ZOSYN) IVPB 3.375  g, 3.375 g, Intravenous, 3 times per day, Kelvin Cellar, MD, 3.375 g at 08/26/15 0551 .  promethazine (PHENERGAN) injection 12.5 mg, 12.5 mg, Intravenous, Once, Dianne Dun, NP, Stopped at 08/23/15 0451 .  promethazine (PHENERGAN) injection 6.25 mg, 6.25 mg, Intravenous, Q6H PRN, Earnstine Regal, PA-C .  pseudoephedrine (SUDAFED) tablet 30 mg, 30 mg, Oral, Q6H PRN, Autumn Messing III, MD, 30 mg at  08/16/15 1225 .  saccharomyces boulardii (FLORASTOR) capsule 250 mg, 250 mg, Oral, BID, Earnstine Regal, PA-C, 250 mg at 08/26/15 1054 .  sodium chloride 0.9 % 1,000 mL with potassium chloride 40 mEq infusion, , Intravenous, Continuous, Dara Hoyer, RPH, Last Rate: 100 mL/hr at 08/26/15 1146 .  sodium chloride 0.9 % 1,000 mL with potassium chloride 40 mEq infusion, , Intravenous, Continuous, Dara Hoyer, RPH .  [START ON 08/27/2015] sodium chloride 0.9 % 1,000 mL with potassium chloride 40 mEq infusion, , Intravenous, Continuous, Amanda M Runyon, RPH .  sodium chloride flush (NS) 0.9 % injection 10-40 mL, 10-40 mL, Intracatheter, PRN, Kelvin Cellar, MD, 10 mL at 08/22/15 0358 .  sodium chloride flush (NS) 0.9 % injection 3 mL, 3 mL, Intravenous, Q12H, Eugenie Filler, MD, 3 mL at 08/24/15 2200   Vital Signs: BP 128/76 mmHg  Pulse 85  Temp(Src) 98.8 F (37.1 C) (Oral)  Resp 18  Ht 5' (1.524 m)  Wt 216 lb 12.8 oz (98.34 kg)  BMI 42.34 kg/m2  SpO2 97%  LMP 02/04/2014  Physical Exam: Abd: soft, tender at drain insertion site. Some erythema where suture is. Suture clipped today, drain secure with adhesive dressing.  Imaging: Ir Catheter Tube Change  08/24/2015  INDICATION: 60 year old female with a history of endometrial sarcoma with diagnosis February 2016. She has had recent abscess drainage with percutaneous drainage performed from the right lateral pelvis/ abdomen 07/31/2015, and then from the left anterior 08/12/2015. EXAM: SINUS TRACT INJECTION / FISTULOGRAM COMPARISON:  Multiple prior CT, most recent 08/22/2015 MEDICATIONS: No additional medications ANESTHESIA/SEDATION: None COMPLICATIONS: None PROCEDURE: Informed written consent was obtained from the patient after a thorough discussion of the procedural risks, benefits and alternatives. All questions were addressed. Maximal Sterile Barrier Technique was utilized including caps, mask, sterile gowns, sterile gloves, sterile  drape, hand hygiene and skin antiseptic. A timeout was performed prior to the initiation of the procedure. Scout images were performed. The skin and subcutaneous tissues surrounding the indwelling drain were generously infiltrated 1% lidocaine for local anesthesia. The indwelling drain was injected with contrast with images stored. The catheter was ligated and a 035 Amplatz wire was advanced into abdominal collection. Using modified Seldinger technique, the indwelling drain was removed any new 14 French pigtail catheter was advanced into the collection. Pigtail catheter was formed, and 30 cc of frankly feculent material was aspirated. Approximately 180 cc of sterile saline was used to your a gate the tube in the cavity, with aspiration of the infused fluid. Catheter was sutured in position and attached to gravity drainage. Patient tolerated the procedure well and remained hemodynamically stable throughout. No complications were encountered and no significant blood loss encountered. 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 Residual abscess cavity. New 14 French pigtail catheter into the right abdomen. 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 The drain was then exchanged for a 14 French pigtail catheter. Signed, Dulcy Fanny. Earleen Newport, DO Vascular and Interventional Radiology  Specialists [facility] Radiology Electronically Signed   By: Corrie Mckusick D.O.   On: 08/24/2015 16:26   Dg Abd Portable 1v  08/24/2015  CLINICAL DATA:  Sudden nausea and vomiting, concern clinically for sepsis EXAM: PORTABLE ABDOMEN - 1 VIEW COMPARISON:  None. FINDINGS: Pigtail catheter right lower quadrant again noted. Gas throughout small large bowel. IMPRESSION: The top catheter again noted. No specific radiographic abnormalities. Electronically Signed   By: Skipper Cliche M.D.   On:  08/24/2015 17:46    Labs:  CBC:  Recent Labs  08/23/15 0430 08/24/15 0445 08/25/15 0603 08/26/15 0539  WBC 12.6* 11.8* 11.2* 10.5  HGB 8.7* 7.6* 8.0* 7.7*  HCT 27.3* 24.1* 25.1* 23.9*  PLT 443* 419* 455* 434*    COAGS:  Recent Labs  08/26/14 1300 07/30/15 2242 07/31/15 0428 08/12/15 0539  INR 0.95 1.49 1.41 1.35  APTT 27  --  32  --     BMP:  Recent Labs  08/23/15 0430 08/24/15 0445 08/25/15 0603 08/26/15 0539  NA 140 142 142 139  K 3.1* 2.9* 3.3* 3.9  CL 107 109 108 107  CO2 _0 GLUCOSE 162* 97 134* 114*  BUN _1 CALCIUM 8.5* 8.3* 8.4* 8.2*  CREATININE 0.46 0.37* 0.54 0.47  GFRNONAA >60 >60 >60 >60  GFRAA >60 >60 >60 >60    LIVER FUNCTION TESTS:  Recent Labs  08/13/15 0822 08/17/15 0532 08/20/15 0534 08/25/15 0603  BILITOT 1.0 0.7 0.6 0.9  AST 13* 27 19 13*  ALT 11* 25 21 12*  ALKPHOS 101 184* 140* 91  PROT 7.1 6.9 7.1 6.0*  ALBUMIN 2.7* 2.8* 2.9* 2.5*    Assessment and Plan: Met endom ca; s/p drainage of RLQ abd abscess 2/24, repositioning 3/21 Earleen Newport She is afebrile today and her WBC cont to trend down. 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 -cont drain with irrigations, further plans per surgery.  Electronically Signed: Ascencion Dike 08/26/2015, 12:49 PM   I spent a total of 15 Minutes at the the patient's bedside AND on the patient's hospital floor or unit, greater than 50% of which was counseling/coordinating care for abdominal abscess

## 2015-08-26 NOTE — Progress Notes (Signed)
MEDICAL ONCOLOGY August 26, 2015, 9:19 AM  Hospital day 28 Antibiotics:  Chemotherapy:  planned adriamycin olaratumab on hold due to present complications  Outpatient Physicians:Emma Rossi/ Andrew Au, [name], [name] [facility] sarcoma clinic),[name] (PCP [facility]), [name]; [name], [name]  EMR reviewed. Patient seen, with nursing student in room  Subjective: No acute problems overnight. Present RLQ drain very uncomfortable with any movement, interfering with activity. Bowels moved ~ 3x thru night appears "a little bloody" to patient. Probably needs to use barrier cream to perirectal area more regularly. Still some cough, continues to improve and mostly NP now. Feels weak when up, knees too painful off of meloxicam to attempt PT, topical voltaren was helpful for knees yesterday and will continue this tid. No other bleeding. Peripheral IV right forearm uncomfortable position; PAC functioning well for TNA; peripheral IV blood draws not difficult per patient. Voiding ok. No soreness mouth/ throat. No swelling LE.    ONCOLOGIC HISTORY  Patient had been menopausal since age 26 until she had what seemed to her to be a menstrual period in Sept 2015, with large blood clot at completion of bleeding stopped then. She continued with lesser bleeding over next several months until she was seen in 06-2014 by [name]. Hemoglobin was 12.7 on 07-04-14. CT CAP 06-25-14 had uterus 16.4 x 13.5 x 14.4 cm with marked expansion of endometrial canal by heterogeneous mass, bilateral pelvic sidewall adenopathy, iliac adenopathy with no definite retroperitoneal or mesenteric adenopathy, no ascites, no liver mets. Attempted endometrial sampling 06-26-14 and 07-03-14 was nondiagnostic; around that time the patient was passing clots and using one large maxipad hourly. She was seen by [name] 07-05-14, with uterine fundus palpable above umbilicus. She  had surgery by [name] at [facility] on 07-14-14, which was exploratory laparotomy with TAH BSO, bilateral total pelvic lymphadenectomy and sampling of aortic and renal nodes. 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 Case was presented at [facility] multidisciplinary conference 07-23-14, with recommendation for PET CT to evaluate inguinal and portahepatis nodes, and to consider adjuvant gemzar taxotere and possibly follow with 4 cycles of adriamycin, then to consider whole pelvic RT. She saw [name] for post op follow up on 07-28-14, with recommendation for gemzar taxotere and adriamycin, whole pelvic RT after chemo and consideration of Megace maintenance after chemo (possibly prior to negative ER PR information). PET 08-01-14 had some uptake in aortocaval and left paraaortic regions. She had day 1 cycle 1 gemzar taxotere on 08-28-14, day 8 cycle 1 on 09-04-14 and neulasta on 09-06-14. Admitted with neutropenic fever on day 14 cycle 1, with oral mucositis and some diarrhea. Counts maintained cycle 2 using OnPro neulasta day 9. She had progressive LE swelling after cycle 3, with venous dopplers negative 10-23-14, then LE swelling and SOB so marked after day 1 cycle 4 10-30-14 that chemo was held. Restaging CT AP + CXR 11-10-14 had stable tiny left pulmonary nodule/ no pleural effusion and normal heart size; CT had necrotic aortocaval adenopathy, iliac adenopathy L>R and 7x10 cm fluid collection in pelvis. She received IMRT 45 gray in 25 fractions to pelvis from 6-27 thru 01-05-15, with resolution of LE swelling by completion of course. CT CAP 02-03-15 showed improvement in retroperitoneal and pelvic involvement. She went onto observation after RT, plan to wait until clear progressive disease before resuming  treatment, possibly adriamycin vs on study. CT CAP 05-07-15 showed new lytic lesion at T4,  stable 2-3 mm pulmonary nodules, increased left pelvic and retroperitoneal nodes and RLQ peritoneal nodule. Radiation therapy summary as follows: Indication for treatment: A new bone lesion in the right side of the T4 vertebral body, with soft tissue extension slightly impinging upon the central spinal canal, some pain from this lesion Radiation treatment dates: 05/18/2015 through 06/05/2015 Site/dose: T4 thoracic spine area, 35 gray in 14 fractions She was not eligible for any of the arms of MATCH trial by evaluation in Jan 2017. Plan was to begin treatment with adriamycin + olaratumab, however this not begun prior to hospitalization 07-30-15 with abdominal/ pelvic abscess.   Objective: Vital signs in last 24 hours: Blood pressure 131/70, pulse 83, temperature 99.1 F (37.3 C), temperature source Oral, resp. rate 18, height 5' (1.524 m), weight 216 lb 12.8 oz (98.34 kg), last menstrual period 02/04/2014, SpO2 97 %. Awake, alert, fully oriented and appropriate,  looks tired and pale but not in acute discomfort lying still supine in bed on RA. Cooperative and pleasant. Hair thin as baseline. PERRL, not icteric. Conjunctivae pale, oral mucosa pale. Mouth moist without lesions. PAC site ok. Heart RRR. Lungs without wheezes or rales anteriorly/ laterally. Abdomen soft, tender to light touch at tubing insertion RLQ. Dark brown liquid in drain. LE no edema, cords. No obvious effusion and no heat/ erythema knees. Moves all extremities.   Intake/Output from previous day: 03/21 0701 - 03/22 0700 In: 4113.1 [P.O.:237; I.V.:2431.7; IV Piggyback:150; TPN:1264.4] Out: 100 [Drains:100] Intake/Output this shift:   Lab Results:  Recent Labs  08/25/15 0603 08/26/15 0539  WBC 11.2* 10.5  HGB 8.0* 7.7*  HCT 25.1* 23.9*  PLT 455* 434*   BMET  Recent Labs  08/25/15 0603 08/26/15 0539  NA 142 139  K 3.3* 3.9  CL 108 107  CO2 25 24  GLUCOSE 134* 114*  BUN 7 7  CREATININE 0.54 0.47  CALCIUM  8.4* 8.2*    Studies/Results: Ir Catheter Tube Change  08/24/2015  INDICATION: 59 year old female with a history of endometrial sarcoma with diagnosis February 2016. She has had recent abscess drainage with percutaneous drainage performed from the right lateral pelvis/ abdomen 07/31/2015, and then from the left anterior 08/12/2015. EXAM: SINUS TRACT INJECTION / FISTULOGRAM COMPARISON:  Multiple prior CT, most recent 08/22/2015 MEDICATIONS: No additional medications ANESTHESIA/SEDATION: None COMPLICATIONS: None PROCEDURE: Informed written consent was obtained from the patient after a thorough discussion of the procedural risks, benefits and alternatives. All questions were addressed. Maximal Sterile Barrier Technique was utilized including caps, mask, sterile gowns, sterile gloves, sterile drape, hand hygiene and skin antiseptic. A timeout was performed prior to the initiation of the procedure. Scout images were performed. The skin and subcutaneous tissues surrounding the indwelling drain were generously infiltrated 1% lidocaine for local anesthesia. The indwelling drain was injected with contrast with images stored. The catheter was ligated and a 035 Amplatz wire was advanced into abdominal collection. Using modified Seldinger technique, the indwelling drain was removed any new 14 French pigtail catheter was advanced into the collection. Pigtail catheter was formed, and 30 cc of frankly feculent material was aspirated. Approximately 180 cc of sterile saline was used to your a gate the tube in the cavity, with aspiration of the infused fluid. Catheter was sutured in position and attached to gravity drainage. Patient tolerated the procedure well and remained hemodynamically stable throughout. No complications were encountered and no significant blood loss  encountered. 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 Residual abscess cavity. New 14 French pigtail  catheter into the right abdomen. 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 The drain was then exchanged for a 14 French pigtail catheter. Signed, Dulcy Fanny. Earleen Newport, DO Vascular and Interventional Radiology Specialists [facility] Radiology Electronically Signed   By: Corrie Mckusick D.O.   On: 08/24/2015 16:26   Dg Abd Portable 1v  08/24/2015  CLINICAL DATA:  Sudden nausea and vomiting, concern clinically for sepsis EXAM: PORTABLE ABDOMEN - 1 VIEW COMPARISON:  None. FINDINGS: Pigtail catheter right lower quadrant again noted. Gas throughout small large bowel. IMPRESSION: The top catheter again noted. No specific radiographic abnormalities. Electronically Signed   By: Skipper Cliche M.D.   On: 08/24/2015 17:46   DISCUSSION:  Patient requesting transfer to [facility] for consideration of surgery, understands this would likely be permanent colostomy.  I have explained that there will need to be an accepting physician at [facility] for the transfer, which I expect surgery and [name] are best to coordinate.  Discussed decrease in hemoglobin to 7.7, seems symptomatic. She is in agreement with transfusion, 1 unit PRBCs ordered and she understands likely would need additional if surgery Will try to secure RLQ drain with thick pad and possibly net stretch gyn panties, tho she tells me IR may also change the stitch securing drain.    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 Drains by IR since 07-31-15, several revisions. Endometrial stromal sarcoma, post gyn oncology surgery at [facility] 07-2014 [facility]) and subsequent radiation to pelvis and aortocaval/ bilateral iliac nodes. Patient would like to see if anything can be offered surgically at [facility]. I have discussed with Dr Harlow Asa by phone following my rounds this AM 2.porgressive endometrial stromal sarcoma: She actually was doing  generally very well, good PS,  despite metastatic disease prior to present pelvic abscess/ bowel fistula problems. Planned start of adriamycin olaratumab held with present problems. Known to Dr Faythe Ghee at [facility], did not match on [facility] trial. 3.PAC in 4.nutritional status: minimal po's now in over 3 weeks, back on TNA. Morbid obesity post gastric banding prior to cancer diagnosis 5.multifactorial anemia: hgb 7.7 and symptomatic with minimal activity: patient in agreement with transfusion PRBCs today 6. Degenerative arthritis knees limiting mobility. Topical voltaren helpful (used meloxicam PTA)   Please page me if needed between my rounds   [place]

## 2015-08-26 NOTE — Progress Notes (Signed)
PT Cancellation Note  Patient Details Name: Erin Santiago MRN: [id number] DOB: 03/23/1956   Cancelled Treatment:    Reason Eval/Treat Not Completed: Medical issues which prohibited therapy (receiving blood. May transfer to [facility].)   [name] 08/26/2015, 4:05 PM Tresa Endo PT [phone]

## 2015-08-26 NOTE — Progress Notes (Signed)
Subjective: Asking to go to [facility], feels better this AM. Drain adjustment has made her more comfortable.  Objective: Vital signs in last 24 hours: Temp:  [99.1 F (37.3 C)] 99.1 F (37.3 C) (03/21 2218) Pulse Rate:  [83] 83 (03/21 2218) Resp:  [18] 18 (03/21 2218) BP: (131)/(70) 131/70 mmHg (03/21 2218) SpO2:  [97 %] 97 % (03/21 2218) Last BM Date: 08/25/15  100 from the drain BM x 3 Labs OK, ongoing anemia Afebrile, VSS Intake/Output from previous day: 03/21 0701 - 03/22 0700 In: 4113.1 [P.O.:237; I.V.:2431.7; IV Piggyback:150; TPN:1264.4] Out: 100 [Drains:100] Intake/Output this shift:    General appearance: alert, cooperative and no distress GI: soft, no pain this Am, Drain is about the same.  Lab Results:   Recent Labs  08/25/15 0603 08/26/15 0539  WBC 11.2* 10.5  HGB 8.0* 7.7*  HCT 25.1* 23.9*  PLT 455* 434*    BMET  Recent Labs  08/25/15 0603 08/26/15 0539  NA 142 139  K 3.3* 3.9  CL 108 107  CO2 25 24  GLUCOSE 134* 114*  BUN 7 7  CREATININE 0.54 0.47  CALCIUM 8.4* 8.2*   PT/INR No results for input(s): LABPROT, INR in the last 72 hours.   Recent Labs Lab 08/20/15 0534 08/25/15 0603  AST 19 13*  ALT 21 12*  ALKPHOS 140* 91  BILITOT 0.6 0.9  PROT 7.1 6.0*  ALBUMIN 2.9* 2.5*     Lipase     Component Value Date/Time   LIPASE 18 07/30/2015 1251     Studies/Results: Ir Catheter Tube Change  08/24/2015  INDICATION: 60 year old female with a history of endometrial sarcoma with diagnosis February 2016. She has had recent abscess drainage with percutaneous drainage performed from the right lateral pelvis/ abdomen 07/31/2015, and then from the left anterior 08/12/2015. EXAM: SINUS TRACT INJECTION / FISTULOGRAM COMPARISON:  Multiple prior CT, most recent 08/22/2015 MEDICATIONS: No additional medications ANESTHESIA/SEDATION: None COMPLICATIONS: None PROCEDURE: Informed written consent was obtained from the patient after a thorough discussion  of the procedural risks, benefits and alternatives. All questions were addressed. Maximal Sterile Barrier Technique was utilized including caps, mask, sterile gowns, sterile gloves, sterile drape, hand hygiene and skin antiseptic. A timeout was performed prior to the initiation of the procedure. Scout images were performed. The skin and subcutaneous tissues surrounding the indwelling drain were generously infiltrated 1% lidocaine for local anesthesia. The indwelling drain was injected with contrast with images stored. The catheter was ligated and a 035 Amplatz wire was advanced into abdominal collection. Using modified Seldinger technique, the indwelling drain was removed any new 14 French pigtail catheter was advanced into the collection. Pigtail catheter was formed, and 30 cc of frankly feculent material was aspirated. Approximately 180 cc of sterile saline was used to your a gate the tube in the cavity, with aspiration of the infused fluid. Catheter was sutured in position and attached to gravity drainage. Patient tolerated the procedure well and remained hemodynamically stable throughout. No complications were encountered and no significant blood loss encountered. 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 Residual abscess cavity. New 14 French pigtail catheter into the right abdomen. 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 The drain was then exchanged for a 14 French pigtail catheter. Signed, Dulcy Fanny. Earleen Newport, DO [facility] Rad[facility] Electronically Signed   By: Corrie Mckusick D.O.  On: 08/24/2015 16:26   Dg Abd Portable 1v  08/24/2015  CLINICAL DATA:  Sudden nausea and vomiting, concern clinically for sepsis EXAM: PORTABLE ABDOMEN - 1 VIEW COMPARISON:  None. FINDINGS: Pigtail catheter right lower quadrant  again noted. Gas throughout small large bowel. IMPRESSION: The top catheter again noted. No specific radiographic abnormalities. Electronically Signed   By: Skipper Cliche M.D.   On: 08/24/2015 17:46    Medications: . sodium chloride   Intravenous Once  . acetaminophen  1,000 mg Oral TID  . acetaminophen  650 mg Oral Once  . antiseptic oral rinse  7 mL Mouth Rinse BID  . feeding supplement  1 Container Oral TID PC & HS  . fluticasone  1 spray Each Nare Daily  . insulin aspart  0-15 Units Subcutaneous 4 times per day  . nystatin  5 mL Oral QID  . nystatin   Topical BID  . piperacillin-tazobactam (ZOSYN)  IV  3.375 g Intravenous 3 times per day  . promethazine  12.5 mg Intravenous Once  . saccharomyces boulardii  250 mg Oral BID  . 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 Sandi Mariscal LLQ drain removed after CT 08/18/15 Endometrial sarcoma,with bilateral pelvic node involvement/spinal & intraabdominal metastasis Hx of hysterectomy/nephrectomy [facility] 2016 -- chemo/radiation therapy/[facility] Vivien Rossetti, MD  Hx of gastric sleeve  Anemia secondary to chronic blood loss Body mass index is 41.6 Malnutrition prealbumin 8.6 3/21//17   Antibiotics: Zosyn restarted she is now on day 24 of Zosyn DVT: SCD's added     Plan:  Pt talked with Dr. Marko Plume and would like to pursue evaluation at [facility].      LOS: 27 days    Jaquavious Mercer 08/26/2015

## 2015-08-26 NOTE — Progress Notes (Signed)
[facility] PROGRESS NOTE  Adelae Wegman [id number] DOB: 1955/06/18 DOA: 07/30/2015 PCP: Merrilee Seashore, MD  Interim summary  Erin Santiago is a 60 y.o. 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 Patient is status post chemotherapy and radiation to the pelvis and back and was due to start chemotherapy on the day of admission due to progression of disease involving the left abdominal pain. Presented to the ED with complaints of 1-2 day history of diffuse abdominal pain. CT abdomen and pelvis showed bowel perforation. Large 9 x 4.5 cm complex right lower quadrant and pelvic abscess containing a small amount of contrast suggesting communication with the distal ileum. General surgery consulted recommended initially nonoperative percutaneous drainage.She was placed on bowel rest and monitored. Interventional radiology was also consulted for drain placement. Patient had a percutaneous drain placed on 07/31/2015 and was placed empirically on IV Zosyn from admission. Wound/abscess cultures grew out Escherichia coli. She had been made NPO with administration of TPN for the course of this hospitalization. Her diet was gradually advanced as she was monitored off of IV antibiotic therapy. Initially she seemed to do well however later developed increasing abdominal pain along with spiking temperatures for which she was restarted on IV Zosyn and again made nothing by mouth. TPN was restarted. In the interim she has had multiple follow-up CT scans. Her last CT scan on 08/22/2015 showed mildly increased size in the complex multilocular right pelvic abscess. On 08/25/2015 she underwent repositioning of her drain Postprocedure she spiked a temperature of 103. Since then she has done well, has not had further temperature spikes. She remains on TPN and IV Zosyn.  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 Gen. surgery discussing case with Dr Marko Plume and [name] who recommended transfer to [facility] for operative intervention.    HPI/Subjective: She reports feeling little better today, states drain feels better not as painful. Remains on TPN  Assessment/Plan: #1 Bowel perforation, complex intra-abdominal fluid collection 9 x 4.5 cm right lower quadrant and pelvic -likely due to tumor invasion -Per CT abdomen and pelvis appears that perforation may have walled off and contained.  -S/p RLQ percutaneous drain 07/31/2015 per IR.  Wound/abscess cultures growing Escherichia coli. She has been on IV Zosyn.  -Gen. surgery and interventional radiology following. -On 08/11/2015 repeat CT scan of abdomen and pelvis did not reveal significant change in large pelvic abscess per radiology, measuring 7 x 11.5 cm -3/8 underwent CT guided placed of a 10 Fr drainage catheter placement into the pelvis via the left lower abdomen yielding 220 cc of purulent material.  - She was re-started on IV Zosyn on 08/21/2015 given fever and elevated white count. A repeat CT scan of abdomen and pelvis performed on 08/22/2015 reported by radiology to have a mildly increased size of a complex multiloculated right pelvic abscess. -On 08/22/2015 she reports feeling better, though spiked a fever of 101.9 at 4:40 am. She remains on zosyn  -Still not tolerating PO -On 08/24/2015 she underwent repositioning of drainage tube. Placed on TPN -Gen. surgery arranging transfer to [facility] for possible surgical intervention  #2 febrile illness -On 08/21/2015 patient reporting feeling poorly, found to have low-grade temperature 100.8. Labs reviewed all be trend in her white count to 12,700. -Chest x-ray did not reveal developing pneumonia. Urinalysis was unremarkable. -She was started on IV Zosyn. -RN reporting that  patient has had several episodes of  diarrhea, however, she states this started after drinking contrast.  -Improving, afebrile for the past 24 hours  #3 anemia -Labs on 08/26/2015 showing hemoglobin of 7.7 -She was typed and crossed and ordered transfusion with packed red blood cells  #4 hypokalemia -On 08/24/2015 having a potassium of 2.9, was replace with 40 mEq total of IV potassium -Repeat labs on 08/26/2015 showing potassium level of 3.9, will continue to monitor.  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 Patient status post chemotherapy and radiation to the pelvis. Patient was to start chemotherapy on the day of admission due to progression of disease involving the left, iliac adenopathy, 2 masses in the right lower quadrant seen on CT scan on 07/21/2015.   -She had a repeat CT scan on 08/11/2015 that revealed mild progression of abdominal pelvic lymphadenopathy consistent with progression of metastatic disease. -Radiology also revealed increase in mild hydronephrosis due to metastatic lymphadenopathy. Renal function remains stable.  -Dr. Marko Plume of medical oncology following, overall prognosis is poor  prophylaxis SCDs for DVT prophylaxis.  Code Status: Full Family Communication: Updated patient. No family at bedside. Disposition Plan: General surgery arranging transfer to [facility] for possible surgical intervention    Consultants:  Gen. surgery: Dr. Excell Seltzer 07/30/2015   oncology: Dr. Marko Plume 07/31/2015  Interventional radiology  Procedures:  CT abdomen and pelvis 07/30/2015  Acute abdominal series 07/30/2015  Right lower quadrant abdominal abscess drain with CT 07/31/2015 for Dr. Annamaria Boots  Antibiotics:  IV Zosyn 07/30/2015   Objective: Filed Vitals:   08/26/15 1338  08/26/15 1408  BP: 127/59 129/66  Pulse: 90 85  Temp: 98.3 F (36.8 C) 98.6 F (37 C)  Resp: 18 18    Intake/Output Summary (Last 24 hours) at 08/26/15 1537 Last data filed at 08/26/15 0600  Gross per 24 hour  Intake 4103.08 ml  Output    100 ml  Net 4003.08 ml   Filed Weights   08/17/15 0500 08/22/15 0650 08/23/15 0602  Weight: 97.478 kg (214 lb 14.4 oz) 98.34 kg (216 lb 12.8 oz) 98.34 kg (216 lb 12.8 oz)    Exam:   General:   She is sitting at bedside chair, looks better  cardiovascular: RRR  Respiratory: Normal respiratory effort, lungs are clear to auscultation bilaterally  Abdomen: Soft, nontender, nondistended. Drain in LLQ- there is mild erythema surrounding drain.   Musculoskeletal: No clubbing cyanosis, 1+ lower extremity edema  Data Reviewed: Basic Metabolic Panel:  Recent Labs Lab 08/20/15 0534 08/22/15 0355 08/23/15 0430 08/24/15 0445 08/24/15 1500 08/25/15 0603 08/26/15 0539  NA 138 138 140 142  --  142 139  K 3.5 3.0* 3.1* 2.9*  --  3.3* 3.9  CL 103 103 107 109  --  108 107  CO2 26 23 23 25   --  25 24  GLUCOSE 108* 127* 162* 97  --  134* 114*  BUN 15 10 8 7   --  7 7  CREATININE 0.58 0.56 0.46 0.37*  --  0.54 0.47  CALCIUM 8.8* 8.4* 8.5* 8.3*  --  8.4* 8.2*  MG 1.9  --   --   --  1.6* 1.8 1.7  PHOS 3.3  --   --   --   --  2.1* 2.8   Liver Function  Tests:  Recent Labs Lab 08/20/15 0534 08/25/15 0603  AST 19 13*  ALT 21 12*  ALKPHOS 140* 91  BILITOT 0.6 0.9  PROT 7.1 6.0*  ALBUMIN 2.9* 2.5*   No results for input(s): LIPASE, AMYLASE in the last 168 hours. No results for input(s): AMMONIA in the last 168 hours. CBC:  Recent Labs Lab 08/22/15 0355 08/23/15 0430 08/24/15 0445 08/25/15 0603 08/26/15 0539  WBC 13.9* 12.6* 11.8* 11.2* 10.5  NEUTROABS  --   --   --  9.1*  --   HGB 8.5* 8.7* 7.6* 8.0* 7.7*  HCT 27.1* 27.3* 24.1* 25.1* 23.9*  MCV 87.1 86.4 87.0 86.9 86.6  PLT 419* 443* 419* 455* 434*   Cardiac Enzymes: No  results for input(s): CKTOTAL, CKMB, CKMBINDEX, TROPONINI in the last 168 hours. BNP (last 3 results) No results for input(s): BNP in the last 8760 hours.  ProBNP (last 3 results) No results for input(s): PROBNP in the last 8760 hours.  CBG:  Recent Labs Lab 08/25/15 1217 08/25/15 1926 08/25/15 2355 08/26/15 0553 08/26/15 1155  GLUCAP 112* 118* 121* 110* 115*    Recent Results (from the past 240 hour(s))  C difficile quick scan w PCR reflex     Status: None   Collection Time: 08/22/15 10:14 PM  Result Value Ref Range Status   C Diff antigen NEGATIVE NEGATIVE Final   C Diff toxin NEGATIVE NEGATIVE Final   C Diff interpretation Negative for toxigenic C. difficile  Final  Urine culture     Status: None   Collection Time: 08/23/15  1:30 AM  Result Value Ref Range Status   Specimen Description URINE, CLEAN CATCH  Final   Special Requests NONE  Final   Culture   Final    8,000 COLONIES/mL INSIGNIFICANT GROWTH Performed at [facility]    Report Status 08/24/2015 FINAL  Final  Culture, blood (routine x 2)     Status: None (Preliminary result)   Collection Time: 08/24/15  6:20 PM  Result Value Ref Range Status   Specimen Description BLOOD RIGHT ARM  Final   Special Requests IN PEDIATRIC BOTTLE 3 CC  Final   Culture   Final    NO GROWTH 2 DAYS Performed at [facility]    Report Status PENDING  Incomplete  Culture, blood (routine x 2)     Status: None (Preliminary result)   Collection Time: 08/24/15  6:20 PM  Result Value Ref Range Status   Specimen Description BLOOD LEFT ARM  Final   Special Requests BOTTLES DRAWN AEROBIC AND ANAEROBIC 5 CC EACH  Final   Culture   Final    NO GROWTH 2 DAYS Performed at [facility]    Report Status PENDING  Incomplete     Studies: Ir Catheter Tube Change  08/24/2015  INDICATION: 60 year old female with a history of endometrial sarcoma with diagnosis February 2016. She has had recent abscess drainage with  percutaneous drainage performed from the right lateral pelvis/ abdomen 07/31/2015, and then from the left anterior 08/12/2015. EXAM: SINUS TRACT INJECTION / FISTULOGRAM COMPARISON:  Multiple prior CT, most recent 08/22/2015 MEDICATIONS: No additional medications ANESTHESIA/SEDATION: None COMPLICATIONS: None PROCEDURE: Informed written consent was obtained from the patient after a thorough discussion of the procedural risks, benefits and alternatives. All questions were addressed. Maximal Sterile Barrier Technique was utilized including caps, mask, sterile gowns, sterile gloves, sterile drape, hand hygiene and skin antiseptic. A timeout was performed prior to the initiation of the procedure.  Scout images were performed. The skin and subcutaneous tissues surrounding the indwelling drain were generously infiltrated 1% lidocaine for local anesthesia. The indwelling drain was injected with contrast with images stored. The catheter was ligated and a 035 Amplatz wire was advanced into abdominal collection. Using modified Seldinger technique, the indwelling drain was removed any new 14 French pigtail catheter was advanced into the collection. Pigtail catheter was formed, and 30 cc of frankly feculent material was aspirated. Approximately 180 cc of sterile saline was used to your a gate the tube in the cavity, with aspiration of the infused fluid. Catheter was sutured in position and attached to gravity drainage. Patient tolerated the procedure well and remained hemodynamically stable throughout. No complications were encountered and no significant blood loss encountered. 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 Residual abscess cavity. New 14 French pigtail catheter into the right abdomen. 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 The drain was then exchanged for a 14  French pigtail catheter. Signed, Dulcy Fanny. Earleen Newport, DO [facility] Radiology Electronically Signed   By: Corrie Mckusick D.O.   On: 08/24/2015 16:26   Dg Abd Portable 1v  08/24/2015  CLINICAL DATA:  Sudden nausea and vomiting, concern clinically for sepsis EXAM: PORTABLE ABDOMEN - 1 VIEW COMPARISON:  None. FINDINGS: Pigtail catheter right lower quadrant again noted. Gas throughout small large bowel. IMPRESSION: The top catheter again noted. No specific radiographic abnormalities. Electronically Signed   By: Skipper Cliche M.D.   On: 08/24/2015 17:46    Scheduled Meds: . sodium chloride   Intravenous Once  . acetaminophen  1,000 mg Oral TID  . antiseptic oral rinse  7 mL Mouth Rinse BID  . feeding supplement  1 Container Oral TID PC & HS  . fluticasone  1 spray Each Nare Daily  . insulin aspart  0-15 Units Subcutaneous 4 times per day  . nystatin  5 mL Oral QID  . nystatin   Topical BID  . piperacillin-tazobactam (ZOSYN)  IV  3.375 g Intravenous 3 times per day  . promethazine  12.5 mg Intravenous Once  . saccharomyces boulardii  250 mg Oral BID  . sodium chloride flush  3 mL Intravenous Q12H   Continuous Infusions: . Marland KitchenTPN (CLINIMIX-E) Adult 55 mL/hr at 08/25/15 1734   And  . fat emulsion 120 mL (08/25/15 1733)  . Marland KitchenTPN (CLINIMIX-E) Adult     And  . fat emulsion    . 0.9 % sodium chloride with kcl 100 mL/hr at 08/26/15 1146  . 0.9 % sodium chloride with kcl    . [START ON 08/27/2015] 0.9 % sodium chloride with kcl      Time spent: 15 mins    Kelvin Cellar, MD [facility] Pager [email or web address], password [facility] 08/26/2015, 3:37 PM  LOS: 27 days

## 2015-08-26 NOTE — Progress Notes (Signed)
PARENTERAL NUTRITION CONSULT NOTE -   Pharmacy Consult for TPN Indication: Perforated bowel; intolerant to enteral feeds  No Known Allergies  Patient Measurements: Height: 5' (152.4 cm) Weight: 216 lb 12.8 oz (98.34 kg) IBW/kg (Calculated) : 45.5 Adjusted Body Weight: 58.5 kg Usual Weight: ~98kg  Vital Signs: Temp: 99.1 F (37.3 C) (03/21 2218) Temp Source: Oral (03/21 2218) BP: 131/70 mmHg (03/21 2218) Pulse Rate: 83 (03/21 2218) Intake/Output from previous day: 03/21 0701 - 03/22 0700 In: 4113.1 [P.O.:237; I.V.:2431.7; IV Piggyback:150; TPN:1264.4] Out: 100 [Drains:100] Intake/Output from this shift:    Labs:  Recent Labs  08/24/15 0445 08/25/15 0603 08/26/15 0539  WBC 11.8* 11.2* 10.5  HGB 7.6* 8.0* 7.7*  HCT 24.1* 25.1* 23.9*  PLT 419* 455* 434*    Recent Labs  08/24/15 0445 08/24/15 1500 08/25/15 0603 08/26/15 0539  NA 142  --  142 139  K 2.9*  --  3.3* 3.9  CL 109  --  108 107  CO2 25  --  25 24  GLUCOSE 97  --  134* 114*  BUN 7  --  7 7  CREATININE 0.37*  --  0.54 0.47  CALCIUM 8.3*  --  8.4* 8.2*  MG  --  1.6* 1.8 1.7  PHOS  --   --  2.1* 2.8  PROT  --   --  6.0*  --   ALBUMIN  --   --  2.5*  --   AST  --   --  13*  --   ALT  --   --  12*  --   ALKPHOS  --   --  91  --   BILITOT  --   --  0.9  --   PREALBUMIN  --  9.3* 8.6*  --   TRIG  --   --  135  --    Estimated Creatinine Clearance: 78.6 mL/min (by C-G formula based on Cr of 0.47).    Recent Labs  08/25/15 1926 08/25/15 2355 08/26/15 0553  GLUCAP 118* 121* 110*   Medical History: Past Medical History  Diagnosis Date  . Abnormal uterine bleeding   . Anemia   . Fibroid   . Heart murmur     under 5 yrs of age  . Cancer ([place]) dx'd 07/2014    endometrial stromal sarcoma  . Radiation 12/01/14-01/05/15    pelvis 45 gray  . Radiation 05/18/2015-06/05/15    T4 thoracic spine area 35 gray   Insulin Requirements during previous day:  - none - no hx DM  Current Nutrition: back  to [facility] 3/20 + TPN (pt did not tolerate soft diet) - Resource Breeze QID, pt states that she can drink 4-5 of these without problems daily. Each of these provides 9g protein and 250 kcal.  Total daily nutrition from 4 containers = 36g protein and 1000 kcal.  IVF: NS with 40 mEq KCl at 100 ml/hr  Central access: implanted port 11/19/14 TPN start date: 08/05/15  ASSESSMENT  HPI: 60 yo F admitted 2/23 with abdominal pain and noted to have perforated bowel.  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 She completed a course of Zosyn for IAI, which was resumed later this admission on 3/17 when pt became febrile again.  Patient was tolerating oral diet advancements so TPN was discontinued in hopes that this would continue to improve her appetite; however, pt started having continued nausea/vomiting and intolerance to diet so TPN was resumed.  Significant events:  3/1: IR consulted for drain adjustment (bulb changed) 3/3: TPN advanced to goal rate 3/5: abscess drainage decreased, 85 ml/24 hr, + BM's 3/6: patient reported BM this morning and eating about ~75% of meals (not documented in [facility]), Resource started 3/7 abd CT: no signif change in pelvic abscess, metastatic lymphadenopathy in the left lower pelvis. 3/8 drainage catheter in pelvis placed by IR 3/12 pt feels full, attributes to TPN. Decrease TPN  3/13 pt feels better re: fullness but having more abd pain, vomited x 1 this am 3/16 Patient tolerating diet.  TPN stopped.  3/20 Restarting TPN- patient not eating.  Drain exchanged/upsized - findings of residual abscess cavity with aspiration of ~40 cc of feculent fluid.  Today:   Glucose (goal cbgs <150): all cbgs <150  Electrolytes: K+,  Phos WNL after replacement yesterday. Mag 1.7 (low end of normal). CorrCa wnl.    Renal:  stable at patient's baseline  LFTs: WNL  TGs: 188 (3/2), 115 (3/6), 135 (3/13), 135 (3/21)  Prealbumin: 6.4 (3/2), 13.7 (3/6), 20.5 (3/13), 9.3 (3/20), 8.6 (3/21)  NUTRITIONAL GOALS                                                                                             RD recs (3/21) Kcal: 1950-2150 (20-22 kcal/kg, using TBW) Protein: 120-140 grams of protein (1.25-1.45 grams/kg, using TBW) Fluid: 2 L/day  Patient receives 36g protein and 1000 kcal from [employer] daily.  Clinimix 5/15 at 55 ml/hr + 20% lipid emulsion at 5 ml/hr will provide 66g protein and 1177 kcal daily.  Total daily nutritional intake = 102g protein, 2177 kcal.  Cannot push protein administration much further without overfeeding kcal.  Note, this would equate to 1.75 g/kg and 37 kcal/kg using adjusted body weight.  PLAN                                                                                                                         This morning:  Mag sulfate 1g  IV x 1.  At 1800 today:  Since patient tolerating Clinimix E 5/15 at 37ml/hr, would like to transition to cycling the TPN to give patient some freedom during the day as well as plan for potential of TPN at discharge if that's a possibility.  Will start 18 hour cycle tonight.  Adjust rates as follows:  From 1800-1900: infuse at 50 ml/hr  From 1900-1100: infuse at 76 ml/hr  From 1100-1200: infuse at 50 ml/hr  20% fat emulsion at 7 ml/hr over 18 hours.  TPN to contain standard multivitamins and trace elements.  Adjust moderate SSI & CBGs to test on and off TPN.  IVF per surgery; however, will reduce IVF to [facility] while TPN running.  TPN lab panels on Mondays & Thursdays.    F/u daily.  Hershal Coria, PharmD, BCPS Pager: [phone] 08/26/2015 8:23 AM

## 2015-08-27 DIAGNOSIS — T451X5A Adverse effect of antineoplastic and immunosuppressive drugs, initial encounter: Secondary | ICD-10-CM

## 2015-08-27 DIAGNOSIS — D6481 Anemia due to antineoplastic chemotherapy: Secondary | ICD-10-CM

## 2015-08-27 LAB — COMPREHENSIVE METABOLIC PANEL
ALK PHOS: 93 U/L (ref 38–126)
ALT: 15 U/L (ref 14–54)
ANION GAP: 8 (ref 5–15)
AST: 19 U/L (ref 15–41)
Albumin: 2.7 g/dL — ABNORMAL LOW (ref 3.5–5.0)
BUN: 9 mg/dL (ref 6–20)
CALCIUM: 8.6 mg/dL — AB (ref 8.9–10.3)
CHLORIDE: 104 mmol/L (ref 101–111)
CO2: 26 mmol/L (ref 22–32)
CREATININE: 0.52 mg/dL (ref 0.44–1.00)
Glucose, Bld: 110 mg/dL — ABNORMAL HIGH (ref 65–99)
Potassium: 4.1 mmol/L (ref 3.5–5.1)
Sodium: 138 mmol/L (ref 135–145)
Total Bilirubin: 1 mg/dL (ref 0.3–1.2)
Total Protein: 6.6 g/dL (ref 6.5–8.1)

## 2015-08-27 LAB — CBC
HCT: 28.3 % — ABNORMAL LOW (ref 36.0–46.0)
Hemoglobin: 9.2 g/dL — ABNORMAL LOW (ref 12.0–15.0)
MCH: 26.5 pg (ref 26.0–34.0)
MCHC: 32.5 g/dL (ref 30.0–36.0)
MCV: 81.6 fL (ref 78.0–100.0)
PLATELETS: 499 10*3/uL — AB (ref 150–400)
RBC: 3.47 MIL/uL — AB (ref 3.87–5.11)
RDW: 16.3 % — ABNORMAL HIGH (ref 11.5–15.5)
WBC: 13.3 10*3/uL — ABNORMAL HIGH (ref 4.0–10.5)

## 2015-08-27 LAB — GLUCOSE, CAPILLARY
GLUCOSE-CAPILLARY: 103 mg/dL — AB (ref 65–99)
GLUCOSE-CAPILLARY: 97 mg/dL (ref 65–99)
GLUCOSE-CAPILLARY: 77 mg/dL (ref 65–99)
Glucose-Capillary: 99 mg/dL (ref 65–99)

## 2015-08-27 LAB — TYPE AND SCREEN
ABO/RH(D): O POS
ANTIBODY SCREEN: NEGATIVE
UNIT DIVISION: 0

## 2015-08-27 LAB — MAGNESIUM: MAGNESIUM: 2 mg/dL (ref 1.7–2.4)

## 2015-08-27 LAB — PHOSPHORUS: PHOSPHORUS: 3.4 mg/dL (ref 2.5–4.6)

## 2015-08-27 MED ORDER — PSEUDOEPHEDRINE HCL 30 MG PO TABS: 30.0000 mg | ORAL_TABLET | Freq: Four times a day (QID) | ORAL | Status: DC | PRN

## 2015-08-27 MED ORDER — BOOST / RESOURCE BREEZE PO LIQD: 1.0000 | Freq: Three times a day (TID) | ORAL | Status: DC

## 2015-08-27 MED ORDER — FLUTICASONE PROPIONATE 50 MCG/ACT NA SUSP: 1.0000 | Freq: Every day | NASAL | Status: DC

## 2015-08-27 MED ORDER — ACETAMINOPHEN 500 MG PO TABS: 1000.0000 mg | ORAL_TABLET | Freq: Three times a day (TID) | ORAL | Status: DC

## 2015-08-27 MED ORDER — NYSTATIN 100000 UNIT/ML MT SUSP: 5.0000 mL | Freq: Four times a day (QID) | OROMUCOSAL | Status: DC

## 2015-08-27 MED ORDER — MORPHINE SULFATE (PF) 2 MG/ML IV SOLN: 1.0000 mg | INTRAVENOUS | Status: DC | PRN

## 2015-08-27 MED ORDER — PIPERACILLIN-TAZOBACTAM 3.375 G IVPB: 3.3750 g | Freq: Three times a day (TID) | INTRAVENOUS | Status: DC

## 2015-08-27 MED ORDER — HEPARIN SOD (PORK) LOCK FLUSH 100 UNIT/ML IV SOLN
500.0000 [IU] | INTRAVENOUS | Status: DC | PRN
  Administered 2015-08-27: 500 [IU]
  Filled 2015-08-27 (×2): qty 5

## 2015-08-27 MED ORDER — VANCOMYCIN HCL 10 G IV SOLR: 1250.0000 mg | Freq: Two times a day (BID) | INTRAVENOUS | Status: DC

## 2015-08-27 MED ORDER — VANCOMYCIN HCL 10 G IV SOLR
1250.0000 mg | Freq: Two times a day (BID) | INTRAVENOUS | Status: DC
  Administered 2015-08-27: 1250 mg via INTRAVENOUS
  Filled 2015-08-27 (×3): qty 1250

## 2015-08-27 MED ORDER — SACCHAROMYCES BOULARDII 250 MG PO CAPS: 250.0000 mg | ORAL_CAPSULE | Freq: Two times a day (BID) | ORAL | Status: DC

## 2015-08-27 MED ORDER — METHOCARBAMOL 1000 MG/10ML IJ SOLN: 1000.0000 mg | Freq: Four times a day (QID) | INTRAVENOUS | Status: DC | PRN

## 2015-08-27 MED ORDER — NYSTATIN 100000 UNIT/GM EX POWD: CUTANEOUS | Status: DC

## 2015-08-27 MED ORDER — OXYCODONE HCL 5 MG PO TABS: 5.0000 mg | ORAL_TABLET | ORAL | Status: AC | PRN

## 2015-08-27 MED ORDER — HEPARIN SOD (PORK) LOCK FLUSH 100 UNIT/ML IV SOLN
500.0000 [IU] | INTRAVENOUS | Status: DC
  Filled 2015-08-27: qty 5

## 2015-08-27 MED ORDER — BISACODYL 10 MG RE SUPP: 10.0000 mg | Freq: Every day | RECTAL | Status: DC | PRN

## 2015-08-27 NOTE — Progress Notes (Signed)
Discharged to [facility] via [place] at 2000

## 2015-08-27 NOTE — Progress Notes (Signed)
Pharmacy Antibiotic Note  Erin Santiago is a 60 y.o. female admitted on 07/30/2015 with bacteremia.  Pharmacy has been consulted for Vancomycin dosing.  Plan: Vancomycin 1250 mg IV every 12 hours.  Goal trough 15-20 mcg/mL.  Height: 5' (152.4 cm) Weight: 216 lb 12.8 oz (98.34 kg) IBW/kg (Calculated) : 45.5  Temp (24hrs), Avg:98.4 F (36.9 C), Min:97.6 F (36.4 C), Max:98.8 F (37.1 C)   Recent Labs Lab 08/22/15 0355 08/23/15 0430 08/24/15 0445 08/25/15 0603 08/26/15 0539  WBC 13.9* 12.6* 11.8* 11.2* 10.5  CREATININE 0.56 0.46 0.37* 0.54 0.47    Estimated Creatinine Clearance: 78.6 mL/min (by C-G formula based on Cr of 0.47).    No Known Allergies  Antimicrobials this admission: 3/23 vancomycin >>  3/17 zosyn >>   Dose adjustments this admission:   Microbiology results:  BCx:   UCx:    Sputum:    MRSA PCR:   Thank you for allowing pharmacy to be a part of this patient's care.  Dorrene German 08/27/2015 5:17 AM

## 2015-08-27 NOTE — Progress Notes (Signed)
Date:  August 28, 2015 Chart reviewed for concurrent status and case management needs. Will continue to follow patient for changes and needs: Patient is being transferred to [facility] due to needing higher level of GYN-onco services that this facility does not have.  TCT-Amy at [facility] this information and that patient is being accepted by Dr. [name] a  [facility] due to condition and needs. Velva Harman, BSN, [place], [place]   [phone]

## 2015-08-27 NOTE — Progress Notes (Signed)
Subjective: Pt is stable this AM, she was transfused yesterday and noted she was voiding a great deal, after the transfusion.  Dr. Denman George has talked with Dr. Clarene Essex at [facility] and she is going to be transferred to [facility] hopefully today.  I have spoken with DR. Soper and she will go to a general medical surgical floor.    Objective: Vital signs in last 24 hours: Temp:  [97.6 F (36.4 C)-99.1 F (37.3 C)] 99.1 F (37.3 C) (03/23 0555) Pulse Rate:  [84-91] 84 (03/23 0555) Resp:  [16-19] 16 (03/23 0555) BP: (127-150)/(57-79) 128/57 mmHg (03/23 0555) SpO2:  [96 %-100 %] 96 % (03/23 0555) Last BM Date: 08/26/15  Intake/Output from previous day: 03/22 0701 - 03/23 0700 In: 2413.9 [Blood:307.5; IV Piggyback:400; TPN:1706.4] Out: 435 [Urine:400; Drains:35] Intake/Output this shift:    General appearance: alert, cooperative and no distress Gsoft, sore, no real change today. complaining less about the drain site.soft, sore and a little tender this AM. Site is OK.    Lab Results:   Recent Labs  08/25/15 0603 08/26/15 0539  WBC 11.2* 10.5  HGB 8.0* 7.7*  HCT 25.1* 23.9*  PLT 455* 434*    BMET  Recent Labs  08/25/15 0603 08/26/15 0539  NA 142 139  K 3.3* 3.9  CL 108 107  CO2 25 24  GLUCOSE 134* 114*  BUN 7 7  CREATININE 0.54 0.47  CALCIUM 8.4* 8.2*   PT/INR No results for input(s): LABPROT, INR in the last 72 hours.   Recent Labs Lab 08/25/15 0603  AST 13*  ALT 12*  ALKPHOS 91  BILITOT 0.9  PROT 6.0*  ALBUMIN 2.5*     Lipase     Component Value Date/Time   LIPASE 18 07/30/2015 1251     Studies/Results: No results found.  Medications: . sodium chloride   Intravenous Once  . acetaminophen  1,000 mg Oral TID  . antiseptic oral rinse  7 mL Mouth Rinse BID  . feeding supplement  1 Container Oral TID PC & HS  . fluticasone  1 spray Each Nare Daily  . insulin aspart  0-15 Units Subcutaneous 4 times per day  . nystatin  5 mL Oral QID  . nystatin   Topical BID   . piperacillin-tazobactam (ZOSYN)  IV  3.375 g Intravenous 3 times per day  . promethazine  12.5 mg Intravenous Once  . saccharomyces boulardii  250 mg Oral BID  . sodium chloride flush  3 mL Intravenous Q12H  . 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 IR drain 07/31/15 - culture: E coli CT IMAGE GUIDED DRAINAGE BY PERCUTANEOUS CATHETER, 08/12/15, LEFT lower abdomen 220 ml drained; Dr. Sandi Mariscal LLQ drain removed after CT 08/18/15 IR catheter change  08/24/15 Endometrial sarcoma,with bilateral pelvic node involvement/spinal & intraabdominal metastasis Hx of hysterectomy/nephrectomy [facility] 2016 -- chemo/radiation therapy/[facility] Vivien Rossetti, MD  Hx of gastric sleeve  Anemia secondary to chronic blood loss Body mass index is 41.6 Malnutrition prealbumin 8.6 3/21//17  Antibiotics: Zosyn restarted she is now on day 24 of Zosyn DVT: SCD's added    Plan:  We are working on transfer to [facility] today. I have talked with transfer service, and I discussed with Dr. Clarene Essex at [facility], Dr. Grandville Silos here at [facility] and DR. Gerkin.      LOS: 28 days    Erin Santiago 08/27/2015

## 2015-08-27 NOTE — Discharge Summary (Signed)
Physician Discharge Summary  Erin Santiago [id number] DOB: 07-13-1955 DOA: 07/30/2015  PCP: Merrilee Seashore, MD  Admit date: 07/30/2015 Discharge date: 08/27/2015  Time spent: 70 minutes  Recommendations for Outpatient Follow-up:  1. Patient to be transferred to [facility] tertiary center.   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)   Abscess of abdominal cavity (HCC)   Anemia   Leukocytosis   Morbid obesity with BMI of 40.0-44.9, adult (HCC)   Iron deficiency anemia due to chronic blood loss   Dehydration   Antineoplastic chemotherapy induced anemia   Metastatic cancer to spine ([place])   Protein-calorie malnutrition, moderate (HCC)   Pressure ulcer   Discharge Condition: Stable  Diet recommendation: Patient on TPN and fat emulsion 20% infusion  Filed Weights   08/17/15 0500 08/22/15 0650 08/23/15 0602  Weight: 97.478 kg (214 lb 14.4 oz) 98.34 kg (216 lb 12.8 oz) 98.34 kg (216 lb 12.8 oz)    History of present illness:  Erin Santiago is a 60 y.o. 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 Patient is status post chemotherapy and radiation to the pelvis and back and was due to start chemotherapy on the day of admission due to progression of disease involving the left abdominal pain. Patient had presented to the ED with complaints of 1-2 day history of diffuse abdominal pain. Patient stated that she woke up around 2 AM one day prior to admission with significant diffuse abdominal pain and when she stood up to go through her bathroom she lost control of her bladder due to probable incontinence. Patient stated that the whole day prior to admission was lethargic and very sleepy and slept  all day. Patient does endorse subjective fevers, chills, nausea, emesis, generalized weakness, diarrhea. Patient also endorses some hematochezia. Patient denies chest pain, no constipation, no dysuria, no hematemesis, no melena. Patient did endorse an episode of hematemesis. Patient denied any cough. Patient was seen in the emergency room comprehensive metabolic profile done at a sodium of 133 bicarbonate of 19 BUN of 29 creatinine of 1.06 glucose of 125 AST of 13 ALT of 23 protein of 7.5 bilirubin of 3.4 otherwise is within normal limits. Initial lactic acid level is elevated at 2.67 and repeat lactic acid level was down to 0.93. CBC had a white count of 11.1 with no other abnormalities. Urinalysis pending. Acute abdominal series showed dilated small bowel loops containing residual contrast in the midabdomen most likely represent a component of small bowel obstruction. Bibasilar atelectasis. CT abdomen and pelvis did show bowel perforation. Large 9 x 4.5 cm complex right lower quadrant and pelvic abscess containing a small amount of contrast suggesting communication with the distal ileum. This could be due to tumor erosion. Stable adenopathy in the abdomen and pelvis. Patient was seen in consultation by general surgery who recommended initially nonoperative percutaneous drainage and admission per hospitalist.   Hospital Course:  #1 Bowel perforation, complex intra-abdominal fluid collection 9 x 4.5 cm right lower quadrant and pelvic likely due to tumor invasion Patient was initially admitted to the step down unit and general surgery consulted and followed the patient throughout the hospitalization. Pt was admitted to Medicine and seen in consult by General Surgery and Dr. Excell Seltzer. Conservative management was recommended and the first of a  series of drains were place by IR. They are listed below. She was initially NPO, and started on TNA. We placed her on IV Zosyn and her pain and initial leukocytosis  improved. She was also followed by Dr. Brynda Greathouse Oncology while she was here. 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 We had planned to begin adriamycin with olaratumab in salvage attempt this week, but did not start due to abdominal pain beginning 2-22. Initial surgery [facility] 07-14-14, progression by start of cycle 4 adjuvant gemzar taxotere in 10-2014. Radiation to symptomatic aortocaval and bilateral iliac adenopathy. Further progression recently including T4, post radiation there. Not eligible for any of the arms of MATCH trial. Plan had been to start adriamycin + olaratumab at [facility] the week of admission, held due bowel perforation.  Her treatment has spanned several weeks and multiple CT scans. Additional drains were placed as noted below, along with ongoing Zosyn, and TNA. We did try to advance her diet, and she always had pain and discomfort. About the only thing she was comfortable with was Breeze a clear nutritional supplement. After we got her LLQ drain out, 08/18/15 we tried to increase her PO intake and weaned off her antibiotics for 2 days on 3/15, and 08/20/15. With that her WBC went up, she had pain with PO's, and started spiking fevers again. Work up found no other source of fever or WBC elevation and she was placed back on Zosyn. She was not taking in enough PO to sustain her because of additional abdominal pain with eating. At this point she was ready to end conservative management and wanted a surgical approach to move forward with salvage chemotherapy. It is Dr. Rayann Heman opinion that we do not have the resources and staff to perform the surgery and extensive support this patient would require here in [place].  We consulted with Dr. Everitt Amber [facility]yn Oncology and she saw the patient. "She noted her prognosis is  poor from her malignancy, as it is a high grade malignancy for which there are  no good systemic treatment options and her disease has proven to be resistant to the optimal therapies (gem/taxotere and radiation). She has measurable disease in the retroperitoneal nodal basins, which is progressing after radiation to these sites, and a T4 metastasis that has been radiated and is stable. Intent of any future oncologic therapy is palliative. Dr. Denman George discussed this with [place], though she was not certain that this is something that she believes. She strongly desires transfer to a tertiary center for consideration of surgical intervention with the goal being that this would enable recovery from her infectious state, and attempt to commence second line salvage chemotherapy with Adriamycin and olaratumab." Dr. Denman George has discussed this and Dr. Clarene Essex at [facility] is going to accept the patient in transfer to [facility].   We are working to make transfer later today. She has disc with her CT scans, labs and recent reports are copied below for the receiving facility.  She was transfused 08/26/2015, with appropriate response in hemoglobin currently at 9.2 from 7.7. TNA and antibiotic are thru a [employer].  #2 febrile illness -On 08/21/2015 patient reporting feeling poorly, found to have low-grade temperature 100.8. Labs reviewed all be trend in her white count to 12,700. -Chest x-ray did not reveal developing pneumonia. Urinalysis was unremarkable. -She was started on IV Zosyn. -RN reporting that patient has had several episodes of diarrhea, however,  she states this started after drinking contrast.  -Improving.  #3 anemia -Labs on 08/26/2015 showing hemoglobin of 7.7 -She was typed and crossed and ordered transfusion with packed red blood cells, 1 unit with hemoglobin 9.2 on 08/27/2015.  #4 hypokalemia -On 08/24/2015 having a potassium of 2.9, was replace with 40 mEq total of IV potassium -Repeat labs on 08/27/2015 showing potassium level of 4.1.  #5 hypomagnesemia -Labs showing magnesium  1.6. Magnesium was repleted and repeat magnesium was 2.0 on 08/27/2015.  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 Patient status post chemotherapy and radiation to the pelvis. Patient was to start chemotherapy on the day of admission due to progression of disease involving the left, iliac adenopathy, 2 masses in the right lower quadrant seen on CT scan on 07/21/2015.  -She had a repeat CT scan on 08/11/2015 that revealed mild progression of abdominal pelvic lymphadenopathy consistent with progression of metastatic disease. -Radiology also revealed increase in mild hydronephrosis due to metastatic lymphadenopathy. Renal function remains stable.  -Dr. Marko Plume of medical oncology following, overall prognosis is poor      Procedures:  CT abdomen and pelvis 07/30/2015, 08/04/2015, 08/05/2015, 08/11/2015, 08/18/2015, 08/22/2015.  Acute abdominal series 07/30/2015  Abdominal x-ray 08/24/2015  Right lower quadrant abdominal abscess drain with CT 07/31/2015 for Dr. Annamaria Boots  CT-guided placement of 10 French drainage catheter into the pelvis via left lower abdomen yielding 220 mL of purulent material per Dr. Pascal Lux 08/12/2015  Indwelling abscess drain injection and exchange/abs size from 10 [place] to 14 [place] per Dr. Earleen Newport 08/24/2015 with aspiration of 40 mL of frankly feculent fluid. 1 80 mL of saline irrigation and aspiration. 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  Consultations:  Gen. surgery: Dr. Excell Seltzer 07/30/2015  Interventional radiology: Dr. Reesa Chew 07/31/2015  GYN oncology: Dr. Denman George 08/26/2015  Oncology: Dr.Livesay 07/31/2015  Discharge Exam: Filed Vitals:   08/27/15 0900 08/27/15 1114  BP: 124/62 148/75  Pulse: 82 94  Temp: 99.3 F (37.4 C) 100.6 F (38.1 C)  Resp: 16 16    General: NAD Cardiovascular: RRR Respiratory: CTAB ABD:  Soft/ND/Obese/+BS/some tenderness to palpation in the lower quadrants.  Discharge Instructions   Discharge Instructions    Increase activity slowly    Complete by:  As directed           Current Discharge Medication List    START taking these medications   Details  acetaminophen (TYLENOL) 500 MG tablet Take 2 tablets (1,000 mg total) by mouth 3 (three) times daily. Qty: 30 tablet, Refills: 0    bisacodyl (DULCOLAX) 10 MG suppository Place 1 suppository (10 mg total) rectally daily as needed for moderate constipation. Qty: 12 suppository, Refills: 0    feeding supplement (BOOST / RESOURCE BREEZE) LIQD Take 1 Container by mouth 4 (four) times daily - after meals and at bedtime. Refills: 0   Associated Diagnoses: Endometrial stromal sarcoma (HCC)    fluticasone (FLONASE) 50 MCG/ACT nasal spray Place 1 spray into both nostrils daily. Refills: 2    methocarbamol 1,000 mg in dextrose 5 % 50 mL Inject 1,000 mg into the vein every 6 (six) hours as needed.    morphine 2 MG/ML injection Inject 0.5-1 mLs (1-2 mg total) into the vein every 2 (two) hours as needed. Qty: 1 mL, Refills: 0    nystatin (MYCOSTATIN) 100000 UNIT/ML suspension Take 5 mLs (500,000 Units total) by mouth 4 (four) times daily.  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 Qty: 30 tablet, Refills: 0    piperacillin-tazobactam (ZOSYN) 3.375 GM/50ML IVPB Inject 50 mLs (3.375 g total) into the vein every 8 (eight) hours. Qty: 50 mL    pseudoephedrine (SUDAFED) 30 MG tablet Take 1 tablet (30 mg total) by mouth every 6 (six) hours as  needed for congestion. Qty: 30 tablet, Refills: 0    saccharomyces boulardii (FLORASTOR) 250 MG capsule Take 1 capsule (250 mg total) by mouth 2 (two) times daily.    vancomycin 1,250 mg in sodium chloride 0.9 % 250 mL Inject 1,250 mg into the vein every 12 (twelve) hours.      CONTINUE these medications which have NOT CHANGED   Details  calcium carbonate (TUMS - DOSED IN MG ELEMENTAL CALCIUM) 500 MG chewable tablet Chew 2 tablets by mouth daily.     dexamethasone (DECADRON) 4 MG tablet Take 2 tablets by mouth once a day on the day after chemotherapy and then take 2 tablets two times a day for 2 days. Take with food. Qty: 30 tablet, Refills: 1   Associated Diagnoses: Endometrial stromal sarcoma (HCC)    docusate sodium (COLACE) 100 MG capsule Take 100 mg by mouth 2 (two) times daily as needed for mild constipation. Reported on 05/28/2015    hydrocortisone 2.5 % cream Apply 1 application topically as needed (itch/psoriasis.). Reported on 07/17/2015    lidocaine-prilocaine (EMLA) cream Apply to Porta-cath 1-2 hrs prior to access. Cover with [employer]. Qty: 30 g, Refills: 1   Associated Diagnoses: Endometrial stromal sarcoma (HCC)    LORazepam (ATIVAN) 1 MG tablet Place 1/2 to 1 tablet under the tongue or swallow every 6 hours as needed for nausea Qty: 20 tablet, Refills: 0   Associated Diagnoses: Endometrial stromal sarcoma (HCC)    ondansetron (ZOFRAN) 8 MG tablet Take 1 tablet (8 mg total) by mouth every 8 (eight) hours as needed for nausea or vomiting. Qty: 30 tablet, Refills: 0   Associated Diagnoses: Endometrial stromal sarcoma (HCC)    prochlorperazine (COMPAZINE) 10 MG tablet Take 1 tablet (10 mg total) by mouth every 6 (six) hours as needed (Nausea or vomiting). Qty: 30 tablet, Refills: 1   Associated Diagnoses: Endometrial stromal sarcoma (HCC)    Psyllium 28.3 % POWD Take 1 packet by mouth daily as needed (for fiber). Reported on 05/26/2015    Resveratrol 250 MG CAPS  Take 250 mg by mouth 2 (two) times daily. Reported on 05/26/2015      STOP taking these medications     meloxicam (MOBIC) 15 MG tablet      ferrous fumarate (HEMOCYTE) 325 (106 FE) MG TABS tablet      sucralfate (CARAFATE) 1 GM/10ML suspension        No Known Allergies Follow-up Information    Follow up with Sandi Mariscal, MD In 2 weeks.   Specialty:  Interventional Radiology   Why:  our office will call you   Contact information:   [address] [place] [address] [phone]        The results of significant diagnostics from  this hospitalization (including imaging, microbiology, ancillary and laboratory) are listed below for reference.    Significant Diagnostic Studies: Ct Abdomen Pelvis Wo Contrast  08/05/2015  CLINICAL DATA:  Metastatic endometrial carcinoma. Right lower quadrant abscess and bowel perforation. Percutaneous drain catheter placed 07/31/2015. Follow-up scan showed residual deep right pelvic collection with fistula. Optimized drainage is requested. EXAM: CT ABDOMEN AND PELVIS WITHOUT CONTRAST TECHNIQUE: Multidetector CT imaging of the abdomen and pelvis was performed following the standard protocol without IV contrast. Upper abdomen is excluded. Additional scans were obtained after contrast injection through the drain catheter. COMPARISON:  08/04/2015 and previous FINDINGS: Hepatobiliary: Negative, incompletely visualized. Pancreas: Negative, incompletely visualized. Spleen: Negative, incompletely visualized. Adrenals/Urinary Tract: Negative, incompletely visualized. Adrenal glands not included. Urinary bladder nondistended. Stomach/Bowel: No evidence of obstruction, inflammatory process. Scattered sigmoid diverticula. Vascular/Lymphatic: 18 mm left para-aortic adenopathy, previously 22 mm. 31 mm aortocaval mass, previously 3.3 cm. 17 mm right external iliac adenopathy, stable. Reproductive: Previous hysterectomy. Other: Persistent Anterior right pelvic  fluid collection with gas and fluid, 43 x 48 mm (previously 5.1 x 4.8 cm), with drain catheter in its dependent aspect. There has been significant decompression of the deep right pelvic fluid collection seen previously containing oral contrast material. The largest evident component measures approximately 4.8 x1.3 cm (previously 7.6 x 4.7). After injection of dilute contrast through the drain catheter, there is opacification of this collection which has clearly decreased in size since the previous day's exam indicating decompression by the drain catheter. No new or undrained fluid collections. Musculoskeletal: No suspicious bone lesions identified. Facet DJD in the lower lumbar spine. IMPRESSION: 1. The lower right pelvic fluid collection has significantly decompressed through the drain catheter since the previous day's scan. This could have been due to suction bulb failure due to high output. Therefore, additional drain catheter was not placed, and bulb was changed to a gravity back to allow continuous drainage. 2. No new or undrained fluid collections. 3. Stable pelvic and retroperitoneal adenopathy. Electronically Signed   By: Lucrezia Europe M.D.   On: 08/05/2015 14:29   Dg Chest 2 View  08/21/2015  CLINICAL DATA:  Fever.  History of small bowel leak. EXAM: CHEST - 2 VIEW COMPARISON:  Abdominal series on 07/30/2015 FINDINGS: The heart size and mediastinal contours are within normal limits. Stable appearance of Port-A-Cath. Improved aeration at both lung bases with mild residual atelectasis remaining bilaterally. There is no evidence of pulmonary edema, consolidation, pneumothorax, nodule or pleural fluid. The visualized skeletal structures are unremarkable. IMPRESSION: Improved bilateral aeration with residual bibasilar atelectasis remaining. Electronically Signed   By: Aletta Edouard M.D.   On: 08/21/2015 14:21   Ct Abdomen Pelvis W Contrast  08/22/2015  CLINICAL DATA:  60 year old female inpatient with  metastatic endometrial sarcoma status post hysterectomy in February 2016 status post radiation therapy and chemotherapy, with progression of metastatic disease complicated by bowel perforation requiring percutaneous drainage x 2. Low-grade fever. Persistent fluid draining from percutaneous tubes. EXAM: CT ABDOMEN AND PELVIS WITH CONTRAST TECHNIQUE: Multidetector CT imaging of the abdomen and pelvis was performed using the standard protocol following bolus administration of intravenous contrast. CONTRAST:  66mL OMNIPAQUE IOHEXOL 300 MG/ML SOLN, 119mL OMNIPAQUE IOHEXOL 300 MG/ML SOLN COMPARISON:  08/18/2015 CT abdomen/pelvis . FINDINGS: Lower chest: Peripheral left upper lobe 3 mm pulmonary nodule (series 4/image 3) is stable. Mild bibasilar atelectasis. Partially visualized is the tip of a superior approach central venous catheter in the lower third of the superior vena cava. Hepatobiliary: Normal liver with  no liver mass. Normal gallbladder with no radiopaque cholelithiasis. No biliary ductal dilatation. Pancreas: Normal, with no mass or duct dilation. Spleen: Normal size. No mass. Adrenals/Urinary Tract: Normal adrenals. No right hydronephrosis. Minimal left hydronephrosis, decreased. No renal mass. Collapsed and grossly normal bladder. Stomach/Bowel: Stable postsurgical changes in the stomach from prior sleeve surgery, with no gross gastric abnormality. No significantly dilated small bowel loops. Oral contrast progresses to the colon. No focal small bowel caliber transition. Reactive wall thickening within several distal ileal small bowel loops in the right lower quadrant. Normal appendix. Minimal diverticulosis in the sigmoid colon, with no large bowel wall thickening. Vascular/Lymphatic: Normal caliber abdominal aorta. Patent portal, splenic, hepatic and renal veins. Enlarged 3.7 cm aortocaval node (series 7/ image 55), previously 3.5 cm, slightly increased, with possible invasion of the third portion of the  duodenum by this node. Enlarged clustered left common iliac nodes, largest 2.2 cm, not appreciably changed. Enlarged 2.3 cm irregular left internal iliac node (series 2/ image 81), not appreciably changed. Enlarged 1.9 cm right external iliac node (series 2/image 70), not appreciably changed. Reproductive: Status post hysterectomy.  No adnexal mass. Other: Interval removal of the left ventral pelvic percutaneous drainage catheter. Tiny residual thick walled 1.8 x 1.6 cm collection at the right vaginal cuff margin (series 2/ image 83), previously 1.9 x 1.7 cm, not appreciably changed. Stable position of ventral right pelvic percutaneous drainage catheter with the distal pigtail tip within the right anterior pelvis. There is a complex multilocular gas containing thick walled abscess in the right lower quadrant, with a dominant 5.6 x 4.8 cm block ill superior to the drainage catheter (series 2/ image 73), previously 4.9 x 4.3 cm, mildly increased and a 4.6 x 4.0 cm o'clock ill posterior/ inferior to the drainage catheter tip (series 2/ image 79), previously 3.4 x 2.5 cm, mildly increased. No oral contrast is seen within the pelvic fluid collections. No free intraperitoneal air. No new fluid collections. Musculoskeletal: No aggressive appearing focal osseous lesions. Moderate degenerative changes in the visualized thoracolumbar spine. IMPRESSION: 1. Mildly increased size of complex multilocular right pelvic abscess as described. 2. Tiny residual abscess at the right vaginal cuff at the previous site of the removed left percutaneous drainage catheter, unchanged. 3. Stable to slightly increased retroperitoneal and bilateral pelvic nodal metastases. 4. Minimal left hydronephrosis, decreased, secondary to infiltrate left pelvic side wall adenopathy. 5. No evidence of bowel obstruction. Mild reactive wall thickening in the distal small bowel. No oral contrast accumulates within the pelvic fluid collections. Electronically  Signed   By: Ilona Sorrel M.D.   On: 08/22/2015 12:07   Ct Abdomen Pelvis W Contrast  08/18/2015  CLINICAL DATA:  Re-evaluate intra-abdominal abscess EXAM: CT ABDOMEN AND PELVIS WITH CONTRAST TECHNIQUE: Multidetector CT imaging of the abdomen and pelvis was performed using the standard protocol following bolus administration of intravenous contrast. CONTRAST:  127mL OMNIPAQUE IOHEXOL 300 MG/ML  SOLN COMPARISON:  08/12/2015, 08/11/2015 FINDINGS: Lower chest: Mild bilateral lung base atelectasis. No pleural effusions identified. There is mitral valve calcification. Hepatobiliary: Negative Pancreas: Pancreas is normal Spleen: Spleen is normal Adrenals/Urinary Tract: The adrenal glands are normal. Right kidney is normal. Mild left hydroureter again identified due to pelvic adenopathy. Dilatation of the left renal pelvis and ureter mildly less prominent when compared to 08/11/2015. Stomach/Bowel: Status post sleeve gastrectomy. Nonobstructive bowel gas pattern. Distal large bowel diverticulosis again identified. Vascular/Lymphatic: No acute vascular abnormalities. Stable upper aortocaval 14 mm lymph node. Stable 43 x 34 mm  partially necrotic lymph node mass in the aortocaval region at the level of the lower pole the kidneys. Stable 22 mm lymph node adjacent to the left common iliac artery. Stable 14 mm pelvic sidewall lymph node along the inner margin of the right acetabulum. Reproductive: Uterus is absent.  No pelvic masses. Other: Since 08/11/2015 a second pelvic drain has been placed. The previous right inferior anterior pelvic drain is again identified, and the air-fluid level containing structure associated with it has decreased in size significantly to about 3 cm in diameter. The left anterior pelvic drain, with tip extending obliquely into the right posterior pelvis, has been placed with significant reduction in the size of abscess to which it was targeted. This component of the abscess now measures only about  2 cm. In total the abscess previously measured 7 x 11 cm. The abscess is now difficult to measure as 1 abnormality as the anterior and posterior components described above no longer demonstrate obvious acute indication. Seen best on image number 68, just cephalad to the course of the initial right-sided pelvic drain, there is a persistent 4 cm abscess in the anterior right lower quadrant. This is unchanged. Musculoskeletal: There are no acute musculoskeletal findings IMPRESSION: Lobulated pelvic abscess previously identified is now markedly smaller, with unchanged position of previous right-sided drain and significant reduction in size of fluid collection associated with it, and placement of second left-sided pelvic drain with market reduction in the component of the abscess as well. A third, approximately 4 cm abscess component in the right lower quadrant anteriorly is unchanged. Electronically Signed   By: Skipper Cliche M.D.   On: 08/18/2015 12:49   Ct Abdomen Pelvis W Contrast  08/11/2015  CLINICAL DATA:  Followup endometrial stromal sarcoma and abscess. Recently began chemotherapy. Previous radiation therapy. EXAM: CT ABDOMEN AND PELVIS WITH CONTRAST TECHNIQUE: Multidetector CT imaging of the abdomen and pelvis was performed using the standard protocol following bolus administration of intravenous contrast. CONTRAST:  143mL OMNIPAQUE IOHEXOL 300 MG/ML  SOLN COMPARISON:  08/04/2015 FINDINGS: Lower chest: Bibasilar atelectasis shows improvement since previous study. Mild cardiomegaly is stable. Hepatobiliary: No masses or other significant abnormality. Gallbladder is unremarkable. Pancreas: No mass, inflammatory changes, or other significant abnormality. Spleen: Within normal limits in size and appearance. Adrenals/Urinary Tract: No adrenal or renal masses identified. No evidence of right-sided hydronephrosis. Mild left hydronephrosis and ureterectasis shows mild increase due to left iliac pelvic  lymphadenopathy which is mildly increased since previous study. 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 Left colonic diverticulosis is again demonstrated, without evidence of diverticulitis. No evidence of bowel obstruction. A left lower quadrant percutaneous drainage catheter remains in place within the anterior portion of a pelvic fluid collection. This pelvic fluid collection currently measures 7.0 x 11.5 cm and is not simply changed in size when measured similarly on previous study. This is consistent with abscess. Vascular/Lymphatic: Portacaval lymph node on image 32 measures 1.4 cm and is not significant changed. Aorto-caval lymphadenopathy shows increase in size, currently measuring 3.4 by 4.3 cm on image 51 compared to 3.3 x 3.7 cm previously. Left common iliac lymphadenopathy measures 2.2 cm on image 58 and shows no significant change in size. Pelvic iliac lymphadenopathy obstructing the distal left ureter currently measures 2.1 x 3.1 cm on image 77 compared to 2.0 x 2.6 cm previously. Right external iliac lymphadenopathy measures 2.2 cm on image 72 compared to 1.7 cm previously. Right obturator internus lymphadenopathy measures 1.4 cm on image 82 compared  to 1.2 cm previously. Reproductive: Prior hysterectomy noted. Above-described pelvic fluid collection involves the hysterectomy bed Other: None. Musculoskeletal:  No suspicious bone lesions identified. IMPRESSION: No significant change in large pelvic abscess measuring approximately 7 x 11.5 cm with percutaneous drainage catheter remaining in place. Overall mild progression of abdominal and pelvic lymphadenopathy, consistent with mild progression of metastatic disease. Mild increase in left hydroureteronephrosis due to metastatic lymphadenopathy in the left lower pelvis. Electronically Signed   By: Earle Gell M.D.   On: 08/11/2015 16:31   Ct Abdomen Pelvis W Contrast  08/04/2015  CLINICAL DATA:  "Has felt much worse  since last pm "worse than I ever felt with the cancer". Drowsy with IV morphine but pain in abdomen not resolved with 1 mg dose last 0500. Pain is diffuse in abdomen and now particularly left lateral upper abdomen, which was "hot and red" last pm; not as tender in mid abdomen as on admission. Increased drainage which appears fecal from RLQ drain. Passed small flatus this AM, no BM. Nausea intermittently including now, vomited x 1 last pm. Febrile now, no shaking chills. Hands puffy. Reports bladder pressure and dysuria. Has been getting up to [facility]. Rash thigh and lower abdomen possibly from toradol, resolved. Coughs with tries to breathe deeply" Hx endometrial sarcoma  Dx'd 07/2014 EXAM: CT ABDOMEN AND PELVIS WITH CONTRAST TECHNIQUE: Multidetector CT imaging of the abdomen and pelvis was performed using the standard protocol following bolus administration of intravenous contrast. CONTRAST:  193mL OMNIPAQUE IOHEXOL 300 MG/ML  SOLN COMPARISON:  07/30/2015 FINDINGS: Lung bases: Minimal pleural effusions, greater on the left. Lung base atelectasis. No convincing pneumonia or edema. Effusions are new since the prior study. Atelectasis is similar. Liver, spleen, gallbladder, pancreas, adrenal glands:  Unremarkable. Kidneys, ureters, bladder: No renal masses. Symmetric renal enhancement and excretion. Mild bilateral renal collecting system dilation. Ureters are normal in course and in caliber. Bladder is unremarkable. Uterus:  Surgically absent. Gastrointestinal: The right lower quadrant collection has been partly decompressed with the pigtail catheter. It measures 3.2 cm in thickness where it had measured 4.5 cm. There is still dependent fluid and nondependent air. The portion of the collection that its more medial and inferior, containing the pigtail portion of the catheter, measures approximately 4.8 x 5.1 cm, previously approximately 3.7 x 3.2 cm. Posterior to this there is extraluminal fluid that contains contrast. It  extends from the right lower quadrant adjacent to small bowel loops to the posterior pelvic recess. It measures 7.6 x 4.7 cm transversely, previously 6.6 x 3.5 cm. There is no residual free intraperitoneal air. There are no new collections. Inflammatory haziness in straining lies within the fat of the right lower quadrant and pelvis. Colon is mostly decompressed. There are diverticula mostly along the left colon without evidence of diverticulitis. A normal appendix is visualized. There is mild wall thickening of loops of small bowel adjacent to the right lower quadrant collections. Remainder of the small bowel is normal in caliber with no wall thickening. Stomach shows chronic changes from previous gastric surgery, stable. Lymph nodes: There are enlarged heterogeneous retroperitoneal lymph nodes. An aortocaval node just above the bifurcation of the aorta measures 3.7 x 3.3 cm, without change from the prior study. A left common iliac chain node measures 2.2 cm, also without significant change. There are additional enlarged nodes extending inferior from this, stable. A right external iliac chain node measures 17 mm in short axis, also unchanged. Musculoskeletal: No osteoblastic or osteolytic lesions. Stable degenerative changes  of the visualized spine. IMPRESSION: 1. Since the prior CT, percutaneous drainage of the right lower quadrant collection has been performed. Although the lateral portion of this collection has decreased in size, the more medial and inferior portion is larger. In addition, the collection extending from the right lower quadrant into the posterior pelvic recess has increased in size. It contains contrast reflecting continued communication with bowel. There are no new collections and there is no residual free air. Persistent inflammatory change surrounds the collections in the right lower quadrant small bowel. 2. Minimal pleural effusions, new since prior study. Lung base atelectasis is similar to  the prior exam. 3. No other changes.  Stable metastatic adenopathy. Electronically Signed   By: Lajean Manes M.D.   On: 08/04/2015 16:34   Ct Abdomen Pelvis W Contrast  07/30/2015  CLINICAL DATA:  Acute onset of abdominal pain today. Diarrhea. History of metastatic endometrial carcinoma. EXAM: CT ABDOMEN AND PELVIS WITH CONTRAST TECHNIQUE: Multidetector CT imaging of the abdomen and pelvis was performed using the standard protocol following bolus administration of intravenous contrast. CONTRAST:  122mL OMNIPAQUE IOHEXOL 300 MG/ML  SOLN COMPARISON:  07/21/2015. FINDINGS: Lower chest: Significant bibasilar atelectasis. No pleural effusion. No pericardial effusion. The distal esophagus is grossly normal. 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 Hepatobiliary: No focal hepatic lesions or intrahepatic biliary dilatation. The gallbladder is normal. No common bile duct dilatation. Pancreas: No mass, inflammation or ductal dilatation. Spleen: Normal size.  No focal lesions. Adrenals/Urinary Tract: The adrenal glands and kidneys are unremarkable and stable. Stomach/Bowel: Surgical changes involving the stomach with gastric sleeve procedure. The duodenum is unremarkable. The proximal and mid small bowel is unremarkable. There is moderate inflammation in falling the terminal and distal ileum. On the prior CT scan there was a 5 cm "Mass" near the cecum. This now contains air and contrast suggesting that there has been erosion into the small bowel or a walled-off small bowel perforation. In retrospect I am not sure that at the pericecal lesion was actually nodal disease and may have been a complex fluid collection at that point that has now fistualized. Also, if this was a solid mass that had necrosis and eroded into the small bowel I would not expect to see air throughout the entire area. Either way, this is a bowel perforation and abscess and need to be drained. There is also moderate fluid in the small  amount of air in the pelvis which may communicate. Vascular/Lymphatic: The nodal disease in the abdomen/pelvis appears relatively stable since 2 weeks ago. The periduodenal lesion on image 30 measures 12 mm. Retroperitoneal lesion on image 45 measures 37.5 mm. The left-sided retroperitoneal nodal lesion on image number 52 measures 21.5 mm. Left pelvic nodal lesion on image number 72 measures 20.5 mm. Other: Status post hysterectomy.  No inguinal adenopathy. Musculoskeletal: No significant bony findings. IMPRESSION: CT findings consistent with a bowel perforation. There is a large (9 x 4.5 cm) complex right lower quadrant and pelvic abscess containing a small amount of contrast suggesting communication with the distal ileum. This could be due to tumor erosion as discussed above. Stable adenopathy in the abdomen/pelvis. These results were called by telephone at the time of interpretation on 07/30/2015 at 3:41 pm to Dr. Margarita Mail , who verbally acknowledged these results. Electronically Signed   By: Marijo Sanes M.D.   On: 07/30/2015 15:41   Ir Catheter Tube Change  08/24/2015  INDICATION: 60 year old female with a history of endometrial sarcoma with  diagnosis February 2016. She has had recent abscess drainage with percutaneous drainage performed from the right lateral pelvis/ abdomen 07/31/2015, and then from the left anterior 08/12/2015. EXAM: SINUS TRACT INJECTION / FISTULOGRAM COMPARISON:  Multiple prior CT, most recent 08/22/2015 MEDICATIONS: No additional medications ANESTHESIA/SEDATION: None COMPLICATIONS: None PROCEDURE: Informed written consent was obtained from the patient after a thorough discussion of the procedural risks, benefits and alternatives. All questions were addressed. Maximal Sterile Barrier Technique was utilized including caps, mask, sterile gowns, sterile gloves, sterile drape, hand hygiene and skin antiseptic. A timeout was performed prior to the initiation of the procedure. Scout  images were performed. The skin and subcutaneous tissues surrounding the indwelling drain were generously infiltrated 1% lidocaine for local anesthesia. The indwelling drain was injected with contrast with images stored. The catheter was ligated and a 035 Amplatz wire was advanced into abdominal collection. Using modified Seldinger technique, the indwelling drain was removed any new 14 French pigtail catheter was advanced into the collection. Pigtail catheter was formed, and 30 cc of frankly feculent material was aspirated. Approximately 180 cc of sterile saline was used to your a gate the tube in the cavity, with aspiration of the infused fluid. Catheter was sutured in position and attached to gravity drainage. Patient tolerated the procedure well and remained hemodynamically stable throughout. No complications were encountered and no significant blood loss encountered. 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 Residual abscess cavity. New 14 French pigtail catheter into the right abdomen. 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 The drain was then exchanged for a 14 French pigtail catheter. Signed, Dulcy Fanny. Earleen Newport, DO [facility] Electronically Signed   By: Corrie Mckusick D.O.   On: 08/24/2015 16:26   Dg Abd Acute W/chest  07/30/2015  CLINICAL DATA:  Shortness of breath today, history endometrial cancer, diverticulosis, abdominal pain, cold sensation, blood in diarrhea, urinary incontinence, post chemotherapy and radiation therapy EXAM: DG ABDOMEN ACUTE W/ 1V CHEST COMPARISON:  Chest radiograph 11/10/2014, CT chest abdomen and pelvis 07/21/2015 FINDINGS: RIGHT jugular Port-A-Cath with tip projecting over SVC above cavoatrial junction. Minimal enlargement of cardiac silhouette. Mild tortuosity of  thoracic aorta. Mediastinal contours and pulmonary vascularity otherwise normal. Bibasilar atelectasis. Lungs otherwise clear. No pleural effusion or pneumothorax. Retained contrast within a few small bowel loops in the mid abdomen which appear prominent, raising question of small bowel obstruction. These small bowel loops appear more prominent than were seen on the recent CT. No definite free intraperitoneal air. Prior gastric surgery. Bones demineralized. IMPRESSION: Dilated small bowel loops containing residual contrast in the mid abdomen cold most likely representing a component of small bowel obstruction. Bibasilar atelectasis. Electronically Signed   By: Lavonia Dana M.D.   On: 07/30/2015 13:16   Dg Abd Portable 1v  08/24/2015  CLINICAL DATA:  Sudden nausea and vomiting, concern clinically for sepsis EXAM: PORTABLE ABDOMEN - 1 VIEW COMPARISON:  None. FINDINGS: Pigtail catheter right lower quadrant again noted. Gas throughout small large bowel. IMPRESSION: The top catheter again noted. No specific radiographic abnormalities. Electronically Signed   By: Skipper Cliche M.D.   On: 08/24/2015 17:46   Ct Image Guided Drainage By Percutaneous Catheter  08/12/2015  INDICATION: History of metastatic endometrial cancer complicated by bowel perforation. Patient underwent technically successful percutaneous drainage catheter placement into the dominant component within the right lower abdominal  quadrant on 07/31/2015, though subsequent imaging has demonstrated a persistent fluid collection with the lower pelvis. As such, the patient presents today for attempted placement of additional percutaneous drainage catheter into the residual pelvic fluid collection. EXAM: CT IMAGE GUIDED DRAINAGE BY PERCUTANEOUS CATHETER COMPARISON:  None. MEDICATIONS: The patient is currently admitted to the hospital and receiving intravenous antibiotics. The antibiotics were administered within an appropriate time frame prior to the  initiation of the procedure. ANESTHESIA/SEDATION: Moderate (conscious) sedation was employed during this procedure. A total of Versed 4 mg and Fentanyl 100 mcg was administered intravenously. Moderate Sedation Time: 22 minutes. The patient's level of consciousness and vital signs were monitored continuously by radiology nursing throughout the procedure under my direct supervision. CONTRAST:  None COMPLICATIONS: None immediate. PROCEDURE: Informed written consent was obtained from the patient after a discussion of the risks, benefits and alternatives to treatment. The patient was placed supine on the CT gantry and a pre procedural CT was performed re-demonstrating the known abscess/fluid collection within the midline of the lower pelvis with dominant component measuring 7.7 x 5.5 cm. The procedure was planned. A timeout was performed prior to the initiation of the procedure. The skin overlying the left lower anterior abdomen/pelvic was prepped and draped in the usual sterile fashion. The overlying soft tissues were anesthetized with 1% lidocaine with epinephrine. Appropriate trajectory was planned with the use of a 22 gauge spinal needle. An 18 gauge trocar needle was advanced into the abscess/fluid collection and a short Amplatz super stiff wire was coiled within the collection. Appropriate positioning was confirmed with a limited CT scan. The tract was serially dilated allowing placement of a 10 [place] all-purpose drainage catheter. Appropriate positioning was confirmed with a limited postprocedural CT scan. Approximately 220 Ml of purulent fluid was aspirated. The tube was connected to a JP bulb and sutured in place. A dressing was placed. The patient tolerated the procedure well without immediate post procedural complication. IMPRESSION: Successful CT guided placement of a 10 French all purpose drain catheter into the lower pelvis via left anterior lateral approach with aspiration of 220 mL of purulent fluid.  Samples were sent to the laboratory as requested by the ordering clinical team. Electronically Signed   By: Sandi Mariscal M.D.   On: 08/12/2015 16:50   Ct Image Guided Drainage By Percutaneous Catheter  07/31/2015  INDICATION: Right lower quadrant abdominal abscess, bowel perforation EXAM: CT GUIDED DRAINAGE OF RIGHT LOWER QUADRANT ABDOMINAL ABSCESS MEDICATIONS: The patient is currently admitted to the hospital and receiving intravenous antibiotics. The antibiotics were administered within an appropriate time frame prior to the initiation of the procedure. ANESTHESIA/SEDATION: 1.0 mg IV Versed 50 mcg IV Fentanyl Moderate Sedation Time:  24 MINUTES The patient was continuously monitored during the procedure by the interventional radiology nurse under my direct supervision. COMPLICATIONS: None immediate. TECHNIQUE: Informed written consent was obtained from the patient after a thorough discussion of the procedural risks, benefits and alternatives. All questions were addressed. Maximal Sterile Barrier Technique was utilized including caps, mask, sterile gowns, sterile gloves, sterile drape, hand hygiene and skin antiseptic. A timeout was performed prior to the initiation of the procedure. PROCEDURE: The right lower quadrant was prepped with ChloraPrep in a sterile fashion, and a sterile drape was applied covering the operative field. A sterile gown and sterile gloves were used for the procedure. Local anesthesia was provided with 1% Lidocaine. Previous imaging reviewed. Patient positioned supine. Noncontrast localization CT performed. The right lower quadrant air-fluid collection adjacent to  the terminal ileum was localized. Under sterile conditions and local anesthesia, an 18 gauge 15 cm access needle was advanced from a right lower quadrant lateral approach into the air-fluid collection. Guidewire inserted. position confirmed with serial CT. Tract dilatation performed to insert a 10 [place] drain. Catheter position  confirmed with CT. Syringe aspiration yielded bloody exudative malodorous fluid. Sample sent for Gram stain and culture. Catheter secured with a Prolene suture and connected to external suction bulb. No immediate complication. Patient tolerated the procedure well. Sterile dressing applied. FINDINGS: CT imaging confirms needle access of the right lower quadrant abscess for drain insertion IMPRESSION: Successful CT-guided right lower quadrant abscess drain insertion as above. Electronically Signed   By: Jerilynn Mages.  Shick M.D.   On: 07/31/2015 16:16    Microbiology: Recent Results (from the past 240 hour(s))  C difficile quick scan w PCR reflex     Status: None   Collection Time: 08/22/15 10:14 PM  Result Value Ref Range Status   C Diff antigen NEGATIVE NEGATIVE Final   C Diff toxin NEGATIVE NEGATIVE Final   C Diff interpretation Negative for toxigenic C. difficile  Final  Urine culture     Status: None   Collection Time: 08/23/15  1:30 AM  Result Value Ref Range Status   Specimen Description URINE, CLEAN CATCH  Final   Special Requests NONE  Final   Culture   Final    8,000 COLONIES/mL INSIGNIFICANT GROWTH Performed at [facility]    Report Status 08/24/2015 FINAL  Final  Culture, blood (routine x 2)     Status: None (Preliminary result)   Collection Time: 08/24/15  6:20 PM  Result Value Ref Range Status   Specimen Description BLOOD RIGHT ARM  Final   Special Requests IN PEDIATRIC BOTTLE 3 CC  Final   Culture   Final    NO GROWTH 3 DAYS Performed at [facility]    Report Status PENDING  Incomplete  Culture, blood (routine x 2)     Status: None (Preliminary result)   Collection Time: 08/24/15  6:20 PM  Result Value Ref Range Status   Specimen Description BLOOD LEFT ARM  Final   Special Requests BOTTLES DRAWN AEROBIC AND ANAEROBIC 5 CC EACH  Final   Culture  Setup Time   Final    GRAM POSITIVE COCCI IN PAIRS IN CHAINS ANAEROBIC BOTTLE ONLY CRITICAL RESULT CALLED TO, READ  BACK BY AND VERIFIED WITH: Monia Pouch RN 2038 08/26/15 A BROWNING    Culture   Final    GRAM POSITIVE COCCI CULTURE REINCUBATED FOR BETTER GROWTH Performed at [facility]    Report Status PENDING  Incomplete     Labs: Basic Metabolic Panel:  Recent Labs Lab 08/23/15 0430 08/24/15 0445 08/24/15 1500 08/25/15 0603 08/26/15 0539 08/27/15 0837  NA 140 142  --  142 139 138  K 3.1* 2.9*  --  3.3* 3.9 4.1  CL 107 109  --  108 107 104  CO2 23 25  --  25 24 26   GLUCOSE 162* 97  --  134* 114* 110*  BUN 8 7  --  7 7 9   CREATININE 0.46 0.37*  --  0.54 0.47 0.52  CALCIUM 8.5* 8.3*  --  8.4* 8.2* 8.6*  MG  --   --  1.6* 1.8 1.7 2.0  PHOS  --   --   --  2.1* 2.8 3.4   Liver Function Tests:  Recent Labs Lab 08/25/15 0603 08/27/15  0837  AST 13* 19  ALT 12* 15  ALKPHOS 91 93  BILITOT 0.9 1.0  PROT 6.0* 6.6  ALBUMIN 2.5* 2.7*   No results for input(s): LIPASE, AMYLASE in the last 168 hours. No results for input(s): AMMONIA in the last 168 hours. CBC:  Recent Labs Lab 08/23/15 0430 08/24/15 0445 08/25/15 0603 08/26/15 0539 08/27/15 0837  WBC 12.6* 11.8* 11.2* 10.5 13.3*  NEUTROABS  --   --  9.1*  --   --   HGB 8.7* 7.6* 8.0* 7.7* 9.2*  HCT 27.3* 24.1* 25.1* 23.9* 28.3*  MCV 86.4 87.0 86.9 86.6 81.6  PLT 443* 419* 455* 434* 499*   Cardiac Enzymes: No results for input(s): CKTOTAL, CKMB, CKMBINDEX, TROPONINI in the last 168 hours. BNP: BNP (last 3 results) No results for input(s): BNP in the last 8760 hours.  ProBNP (last 3 results) No results for input(s): PROBNP in the last 8760 hours.  CBG:  Recent Labs Lab 08/26/15 1155 08/26/15 1625 08/26/15 2116 08/27/15 0559 08/27/15 0803  GLUCAP 115* 98 105* 103* 99       Signed:  Bradie Sangiovanni MD.  [facility] 08/27/2015, 1:48 PM

## 2015-08-27 NOTE — Progress Notes (Signed)
PT Cancellation Note / Sign off  Patient Details Name: Erin Santiago MRN: [id number] DOB: December 09, 1955   Cancelled Treatment:    Reason Eval/Treat Not Completed: Other (comment) Patient to be transferred to [facility] tertiary center.  Will defer PT needs to [facility] 08/27/2015, 2:18 PM Carmelia Bake, PT, DPT 08/27/2015 Pager: [phone]

## 2015-08-27 NOTE — Progress Notes (Signed)
Gave report to Jearld Shines, RN at [facility]. Left number if she had additional questions.

## 2015-08-28 ENCOUNTER — Telehealth: Payer: Self-pay | Admitting: *Deleted

## 2015-08-28 LAB — GLUCOSE, CAPILLARY: Glucose-Capillary: 105 mg/dL — ABNORMAL HIGH (ref 65–99)

## 2015-08-28 NOTE — Telephone Encounter (Signed)
Erin Santiago called and Cancelled her drain appt, states she is going to [facility] to have surgery and will call us if she needs [place] again.

## 2015-08-29 LAB — CULTURE, BLOOD (ROUTINE X 2): CULTURE: NO GROWTH

## 2015-09-02 ENCOUNTER — Other Ambulatory Visit: Payer: Federal, State, Local not specified - PPO

## 2015-09-21 ENCOUNTER — Other Ambulatory Visit: Payer: Self-pay | Admitting: Oncology

## 2015-09-27 ENCOUNTER — Other Ambulatory Visit: Payer: Self-pay | Admitting: Oncology

## 2015-09-27 DIAGNOSIS — C772 Secondary and unspecified malignant neoplasm of intra-abdominal lymph nodes: Secondary | ICD-10-CM

## 2015-09-27 DIAGNOSIS — C7989 Secondary malignant neoplasm of other specified sites: Secondary | ICD-10-CM

## 2015-09-27 DIAGNOSIS — C541 Malignant neoplasm of endometrium: Secondary | ICD-10-CM

## 2015-09-28 ENCOUNTER — Other Ambulatory Visit: Payer: Federal, State, Local not specified - PPO

## 2015-09-28 ENCOUNTER — Inpatient Hospital Stay: Payer: Federal, State, Local not specified - PPO | Admitting: Oncology

## 2015-10-01 ENCOUNTER — Encounter: Payer: Self-pay | Admitting: Oncology

## 2015-10-01 ENCOUNTER — Ambulatory Visit (HOSPITAL_BASED_OUTPATIENT_CLINIC_OR_DEPARTMENT_OTHER): Payer: Federal, State, Local not specified - PPO

## 2015-10-01 ENCOUNTER — Other Ambulatory Visit (HOSPITAL_BASED_OUTPATIENT_CLINIC_OR_DEPARTMENT_OTHER): Payer: Federal, State, Local not specified - PPO

## 2015-10-01 ENCOUNTER — Ambulatory Visit (HOSPITAL_BASED_OUTPATIENT_CLINIC_OR_DEPARTMENT_OTHER): Payer: Federal, State, Local not specified - PPO | Admitting: Oncology

## 2015-10-01 VITALS — BP 132/62 | HR 87 | Temp 99.2°F | Resp 19 | Ht 60.0 in | Wt 211.9 lb

## 2015-10-01 DIAGNOSIS — E440 Moderate protein-calorie malnutrition: Secondary | ICD-10-CM

## 2015-10-01 DIAGNOSIS — C541 Malignant neoplasm of endometrium: Secondary | ICD-10-CM

## 2015-10-01 DIAGNOSIS — D649 Anemia, unspecified: Secondary | ICD-10-CM | POA: Diagnosis not present

## 2015-10-01 DIAGNOSIS — C772 Secondary and unspecified malignant neoplasm of intra-abdominal lymph nodes: Secondary | ICD-10-CM

## 2015-10-01 DIAGNOSIS — Z452 Encounter for adjustment and management of vascular access device: Secondary | ICD-10-CM | POA: Diagnosis not present

## 2015-10-01 DIAGNOSIS — K631 Perforation of intestine (nontraumatic): Secondary | ICD-10-CM

## 2015-10-01 DIAGNOSIS — C7989 Secondary malignant neoplasm of other specified sites: Secondary | ICD-10-CM

## 2015-10-01 DIAGNOSIS — IMO0002 Reserved for concepts with insufficient information to code with codable children: Secondary | ICD-10-CM

## 2015-10-01 DIAGNOSIS — C7951 Secondary malignant neoplasm of bone: Secondary | ICD-10-CM | POA: Diagnosis not present

## 2015-10-01 DIAGNOSIS — D500 Iron deficiency anemia secondary to blood loss (chronic): Secondary | ICD-10-CM

## 2015-10-01 DIAGNOSIS — Z95828 Presence of other vascular implants and grafts: Secondary | ICD-10-CM

## 2015-10-01 DIAGNOSIS — N739 Female pelvic inflammatory disease, unspecified: Secondary | ICD-10-CM

## 2015-10-01 DIAGNOSIS — R5381 Other malaise: Secondary | ICD-10-CM

## 2015-10-01 LAB — CBC WITH DIFFERENTIAL/PLATELET
BASO%: 0.1 % (ref 0.0–2.0)
BASOS ABS: 0 10*3/uL (ref 0.0–0.1)
EOS ABS: 0.7 10*3/uL — AB (ref 0.0–0.5)
EOS%: 3.1 % (ref 0.0–7.0)
HCT: 23 % — ABNORMAL LOW (ref 34.8–46.6)
HGB: 7.5 g/dL — ABNORMAL LOW (ref 11.6–15.9)
LYMPH%: 4.7 % — AB (ref 14.0–49.7)
MCH: 27.9 pg (ref 25.1–34.0)
MCHC: 32.6 g/dL (ref 31.5–36.0)
MCV: 85.5 fL (ref 79.5–101.0)
MONO#: 0.9 10*3/uL (ref 0.1–0.9)
MONO%: 3.8 % (ref 0.0–14.0)
NEUT#: 20.3 10*3/uL — ABNORMAL HIGH (ref 1.5–6.5)
NEUT%: 88.3 % — ABNORMAL HIGH (ref 38.4–76.8)
Platelets: 441 10*3/uL — ABNORMAL HIGH (ref 145–400)
RBC: 2.69 10*6/uL — AB (ref 3.70–5.45)
RDW: 18.2 % — AB (ref 11.2–14.5)
WBC: 23 10*3/uL — ABNORMAL HIGH (ref 3.9–10.3)
lymph#: 1.1 10*3/uL (ref 0.9–3.3)

## 2015-10-01 LAB — COMPREHENSIVE METABOLIC PANEL
ALK PHOS: 116 U/L (ref 40–150)
ALT: <9 U/L (ref 0–55)
AST: 12 U/L (ref 5–34)
Albumin: 2.5 g/dL — ABNORMAL LOW (ref 3.5–5.0)
Anion Gap: 11 mEq/L (ref 3–11)
BUN: 21.6 mg/dL (ref 7.0–26.0)
CHLORIDE: 110 meq/L — AB (ref 98–109)
Calcium: 8.4 mg/dL (ref 8.4–10.4)
Creatinine: 1.4 mg/dL — ABNORMAL HIGH (ref 0.6–1.1)
EGFR: 41 mL/min/{1.73_m2} — AB (ref >90)
GLUCOSE: 95 mg/dL (ref 70–140)
POTASSIUM: 3.6 meq/L (ref 3.5–5.1)
SODIUM: 135 meq/L — AB (ref 136–145)
Total Bilirubin: 0.48 mg/dL (ref 0.20–1.20)
Total Protein: 6.6 g/dL (ref 6.4–8.3)

## 2015-10-01 MED ORDER — SODIUM CHLORIDE 0.9% FLUSH
10.0000 mL | INTRAVENOUS | Status: DC | PRN
  Administered 2015-10-01: 10 mL via INTRAVENOUS
  Filled 2015-10-01: qty 10

## 2015-10-01 NOTE — Progress Notes (Signed)
OFFICE PROGRESS NOTE   October 01, 2015    Physicians: [name]/ [name]/ John Clarene Essex, Jeneen Rinks Kinard, [name] [facility]),[name] (PCP [facility]), [name]; [name], [name]  [facility] records reviewed before and after visit, including those received at office and thru [facility].  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 She was initially hospitalized at [facility] from 2-23 thru 08-27-15, with multiple IR drainage procedures, then transferred to [facility] from 3-23 thru ~ 09-28-15. I was not able to access operative or hospital notes/ DC summary from [place] at time of visit, tho some records additionaly available after this visit and are summarized below. Labs and imaging reports from [facility] were available at visit, as well as [facility] gyn oncology multidisciplinary conference note from 09-02-15. (The [place] note to be scanned into this EMR) Patient's sister is Therapist, sports.   At time of transfer from [place] 08-27-15, patient was receiving TPN, had drains by IR and was on IV antibiotics. Patient and family report that she had 2 surgeries at [facility], the first an exploratory laparotomy by [name] on 08-28-15 with "enterectomy small intestine and anastomosis" and the second an exploratory laparotomy on either 4-11 or 4-14. She was transfused 2 units PRBCs around second surgery for Hgb ~ 6.0. CBC 09-28-15 [facility] had WBC 25.3, Hgb 8.1, plt 370. Chemistries 09-28-15 [facility] had Na 137, K 3.7, creat 1.26, BUN 16, glu 95, ca 8.2, Mg 1.3. Last CMET [facility] 09-14-15 Tprot 6.3, alb 3.2, LFTs WNL. Urine culture negative 09-19-15. She is to follow up with [facility] IR for the RLQ drain, which they report has had no drainage in days, that appointment may be in ~ 2 weeks (?)   Last CT AP at [facility] was 09-25-15 (report copied below)  COPIED FROM [facility] DC SUMMARY AVALIABLE AFTER VISIT: Ileo-ascending bypass with primary reanastomosis and mucus fistula formation on 3/24. Postoperative course was c/b N/V 2/2 ileus. She was made NPO and NGT was placed. NGT was removed but subsequently was replaced on 4/3 when patient developed bilious vomiting, with CT concerning for SBO at anastamotic site. Patient continued to have vomiting around NGT through 4/3, likely due to PO intake despite being NPO. Decadron given to help with bowel wall edema, and improved bowel movement. NGT was removed, but subsequently replaced on 4/8 after advancing diet resulted in bilious emesis again. Repeat CT abd pelvis 4/8 unchanged from prior, and 3 way AXR again concerning for obstruction at anastamotic site. Patient was not passing gas, but exam notable for improvement and normal bowel sounds through POD#20. There was significant discussion regarding returning to the OR, but patient opted to continue conservative management given improving exam and desire to avoid ileostomy. However after multiple failed NGTs, straight drain trials, and intermittent bilious emesis, patient returned to the OR for revision of enterostomy on POD#20 (see Onc history above). She was trialed on straight drain on POD#2 s/p revision of enterostomy and ultimately advanced to soft diet. Out of concern for abdominal abscess (see ID below), patient had sinogram on POD#5 from enteroenterostomy. It showed increased size RLQ loculated fluid collection (see Drains below). CT abd/pelvis done on 4/21 for rising WBC showed mild increase in fluid collection but drain in the correct place although with minimal output (see drains below). We believe this  collection to most likely be a cancerous metastasis.Through the end of her hospital course, leukocytosis (see ID below) was thought to be 2/2 abdominal fluid collection. She was discharged with RLQ JP drain in  place and teaching on drain maintenance. From a nutritional standpoint, patient was on TPN at OSH and TPN was continued during admission until HD#26 at which point she was advanced to a soft diet. Otherwise, her electrolytes were repleted as needed. She was hypernatremic intermittently while on TPN (max Na 149). TPN was adjusted accordingly. She was given IV and PO anti-emetics as needed.  ID  Was febrile on admission to 38.3. WBC on admission was 13.3. 1 of 2 blood cx from OSH 3/20 with GPC in chains and clusters. 3/24 blood cultures at [facility], 1 of 2 with MSSA. Urine culture was negative. CXR negative, flu negative. Remained afebrile after 3/24. CT scan 4/3 showed increase in RLQ fluid collection from 6 to 9 cm. VIR adjusted placement of RLQ drain, vanc was added to antibiotic regimen (renal dosing, see GU above), and WBC subsequently dropped. Repeat CT 4/8 showed slight increase in RLQ fluid collection to 10 cm. All antibiotics were d/c'd on HOD#17 in context of decreasing WBC count and improving exam. WBC continued to drop after discontinuation of antibiotics. After enteroenterostomy, she was febrile on night of POD#1, but defervesced spontaneously. WBC trended upward, though clinically she continued to be well appearing with benign abdominal exam and no fever. However WBC trended up to 23 on POD#4, when levofloxacin and Flagyl were started. White count was elevated to 24.9 on on POD#7, so CT Abd/Pelvis repeated, showing some increase in RLQ fluid collection but optimal placement of drain. Due to stable clinical status and strong desire to return home, patient discharged on PO levofloxacin/Flagyl to complete total 14 day course.  Antibiotics:  -OSH: Zosyn (2/23); vancomycin (3/23)  -Zosyn (3/23-3/25)- d/c'ed d/t AKI  -Vancomycin (3/23- 3/24)- d/c'ed d/t high trough level in setting of AKI. Vancomycin restarted (4/3-4/9) at renal dosing 2/2 increased WBC up to 17.7 -Cefepime/flagyl (3/25  -4/9) -Levofloxacin/Flagyl (4/18- ), transitioned to oral 4/21. She will be discharged to complete a total 14 day course of Levofloxacin/Flagyl.  Drains: Mucus fistula was initially draining feculent material, but became sanguinous during admission. RLQ drain present on admission was originally draining minimal amounts of feculent material. Drain was exchanged by VIR 3/28, then began putting out serosanguinous fluid. CT 4/2 showed increase in fluid collection from 6 to 9 cm, so patient went to VIR for drain repositioning. Drain subsequently produced minimal amount of serosanguinous fluid and then melena. In the setting of sinogram and CT 4/21 showing increased fluid size, (see FEN/GI above), with the drain in place. Vascular Interventional Radiology did not feel that replacing the drain (which flushed correctly and CT showed its correct placement) would result in better drainage, and declined to replace the drain. Foley was placed in mucus fistula and put to intermittent suction, but did not drain for 24 hours, so was subsequently removed. This team believes the fluid collection on CT is likely metastatic cancerous material. Patient was discharged with RLQ JP drain in place.    Dispo: Palliative care consulted while admitted. Patient desires further chemotherapy, but current functional status makes this not possible. She hopes to eventually enroll in a clinical trial, but functional status would need to improve for enrollment to be possible. Goals of care is an ongoing discussion with this patient.  END OF COPIED MATERIAL FROM [facility] DC SUMMARY  Since DC home earlier this week, patient has continued flagyl and levaquin planned x 7 days. She is on prophylactic dose lovenox possibly for 4 weeks. She has [place] nursing and PT involved. She has staples still in place, which sister and patient tell me are to be removed by sister, however she was not given staple removal kit at DC (staple removal kit given now).  Patient is tolerating some regular diet and drinking fluids, discussed small amounts every 1-2 hrs while awake and supplements hopefully 3-4 daily. She has been ambulating at home with walker, is very weak and deconditioned but is already able to tell improvement. She denies fever or chills. She is not having much pain, does have prn tylenol and oxyIR 5 mg available. She is SOB with minimal exertion, no productive cough, not SOB seated in exam room now. She denies frank bleeding. She is not passing anything per rectum. She is voiding. She has had no problems with PAC. She "could not stand being in hospital any longer", apparently insisted on DC home when that was done. Patient has made arrangements for a caregiver to assist several hours daily after her siblings return to [place] on 10-03-15.     ONCOLOGIC HISTORY Patient had been menopausal since age 21 until she had what seemed to her to be a menstrual period in Sept 2015, with large blood clot at completion of bleeding stopped then. She continued with lesser bleeding over next several months until she was seen in 06-2014 by [name]. Hemoglobin was 12.7 on 07-04-14. CT CAP 06-25-14 had uterus 16.4 x 13.5 x 14.4 cm with marked expansion of endometrial canal by heterogeneous mass, bilateral pelvic sidewall adenopathy, iliac adenopathy with no definite retroperitoneal or mesenteric adenopathy, no ascites, no liver mets. Attempted endometrial sampling 06-26-14 and 07-03-14 was nondiagnostic; around that time the patient was passing clots and using one large maxipad hourly. She was seen by [name] 07-05-14, with uterine fundus palpable above umbilicus. She had surgery by [name] at [facility] on 07-14-14, which was exploratory laparotomy with TAH BSO, bilateral total pelvic lymphadenectomy and sampling of aortic and renal nodes. 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 Case was presented at [facility] multidisciplinary conference 07-23-14, with recommendation for PET CT to evaluate inguinal and portahepatis nodes, and to consider adjuvant gemzar taxotere and possibly follow with 4 cycles of adriamycin, then to consider whole pelvic RT. She saw [name] for post op follow up on 07-28-14, with recommendation for gemzar taxotere and adriamycin, whole pelvic RT after chemo and consideration of Megace maintenance after chemo (possibly prior to negative ER PR information). PET 08-01-14 had some uptake in aortocaval and left paraaortic regions. She had day 1 cycle 1 gemzar taxotere on 08-28-14, day 8 cycle 1 on 09-04-14 and neulasta on 09-06-14. Admitted with neutropenic fever on day 14 cycle 1, with oral mucositis and some diarrhea. Counts maintained cycle 2 using OnPro neulasta day 9. She had progressive LE swelling after cycle 3, with venous dopplers negative 10-23-14, then LE swelling and SOB so marked after day 1 cycle 4 10-30-14 that chemo was held. Restaging CT AP + CXR 11-10-14 had stable tiny left pulmonary nodule/ no pleural effusion and normal heart size; CT had necrotic aortocaval adenopathy, iliac adenopathy L>R and 7x10 cm fluid collection in pelvis. She received IMRT 45 gray in 25 fractions to  pelvis from 6-27 thru 01-05-15, with resolution of LE swelling by completion of course. CT CAP 02-03-15 showed improvement in retroperitoneal and pelvic involvement. She went onto observation after RT, plan to wait until clear progressive disease before resuming treatment, possibly adriamycin vs on study. CT CAP 05-07-15 showed new lytic lesion at T4, stable 2-3 mm pulmonary nodules, increased left pelvic and retroperitoneal nodes and RLQ peritoneal nodule. Radiation therapy summary as follows: Indication for treatment: A new bone lesion in the right side of the T4 vertebral body, with soft tissue extension slightly impinging upon the central spinal canal,  some pain from this lesion Radiation treatment dates: 05/18/2015 through 06/05/2015 Site/dose: T4 thoracic spine area, 35 gray in 14 fractions She was not eligible for any of the arms of MATCH trial by evaluation in Jan 2017. Plan was to begin treatment with adriamycin + olaratumab, however this not begun prior to hospitalization 07-30-15 with abdominal/ pelvic abscess. She was hospitalized at [facility] from 2-23 thru 08-27-15 with bowel fistulas and abdominopelvic abscesses. She was transferred to [facility] on 08-27-15 and remained hospitalized there thru ~ 09-28-15, with 2 surgeries for bowel involvement with malignancy.   Objective:  Vital signs in last 24 hours:  BP 132/62 mmHg  Pulse 87  Temp(Src) 99.2 F (37.3 C) (Oral)  Resp 19  Ht 5' (1.524 m)  Wt 211 lb 14.4 oz (96.117 kg)  BMI 41.38 kg/m2  SpO2 100%  LMP 02/04/2014 Weight Jan 2017 was 217 lb. In wheelchair Alert, oriented and appropriate. Pale, appears generally weak but not in acute distress. Respirations not labored seated in exam room. No cough. Sister and brother very supportive. Partial alopecia  HEENT:PERRL, sclerae not icteric. Oral mucosa a little dry without lesions, posterior pharynx clear. Tongue some whitish coating, no thrush on buccal mucosa. Mucous membranes pale. Neck supple. No JVD.  Lymphatics:no cervical,supraclavicular adenopathy Resp: BS heart bilaterally without wheezes or rales. Somewhat diminished BS in bases Cardio: tachy, regular rate and rhythm. No gallop. Clear heart sounds GI: soft, nontender, not more distended than at my last exam in 08-2015. Midline surgical incision closed with staples intact, some minimal erythema at staples, no tenderness or drainage. Ostomy with soft stool. Mucous fistula higher on right with small drainage. JP drain RLQ abdomen insertiion site with sutures slightly erythematious and minimal drainage at site, not particularly tender, minimal brown liquid in tubing only. A few BS.  Abdomen full in lower quadrants. Musculoskeletal/ Extremities: trace - 1+ pedal edema bilaterally without cords, tenderness Neuro: peripheral neuropathy stable. Speech fluent, CN appear intact, moves all extremities and repositions sitting easily. Otherwise nonfocal Skin without rash, ecchymosis, petechiae. Pale nailbeds Portacath-without erythema or tenderness  Lab Results:  Results for orders placed or performed in visit on 10/01/15  CBC with Differential  Result Value Ref Range   WBC 23.0 (H) 3.9 - 10.3 10e3/uL   NEUT# 20.3 (H) 1.5 - 6.5 10e3/uL   HGB 7.5 (L) 11.6 - 15.9 g/dL   HCT 23.0 (L) 34.8 - 46.6 %   Platelets 441 (H) 145 - 400 10e3/uL   MCV 85.5 79.5 - 101.0 fL   MCH 27.9 25.1 - 34.0 pg   MCHC 32.6 31.5 - 36.0 g/dL   RBC 2.69 (L) 3.70 - 5.45 10e6/uL   RDW 18.2 (H) 11.2 - 14.5 %   lymph# 1.1 0.9 - 3.3 10e3/uL   MONO# 0.9 0.1 - 0.9 10e3/uL   Eosinophils Absolute 0.7 (H) 0.0 - 0.5 10e3/uL   Basophils Absolute 0.0  0.0 - 0.1 10e3/uL   NEUT% 88.3 (H) 38.4 - 76.8 %   LYMPH% 4.7 (L) 14.0 - 49.7 %   MONO% 3.8 0.0 - 14.0 %   EOS% 3.1 0.0 - 7.0 %   BASO% 0.1 0.0 - 2.0 %  Comprehensive metabolic panel  Result Value Ref Range   Sodium 135 (L) 136 - 145 mEq/L   Potassium 3.6 3.5 - 5.1 mEq/L   Chloride 110 (H) 98 - 109 mEq/L   CO2 15 repeated (L) 22 - 29 mEq/L   Glucose 95 70 - 140 mg/dl   BUN 21.6 7.0 - 26.0 mg/dL   Creatinine 1.4 (H) 0.6 - 1.1 mg/dL   Total Bilirubin 0.48 0.20 - 1.20 mg/dL   Alkaline Phosphatase 116 40 - 150 U/L   AST 12 5 - 34 U/L   ALT <9 0 - 55 U/L   Total Protein 6.6 6.4 - 8.3 g/dL   Albumin 2.5 (L) 3.5 - 5.0 g/dL   Calcium 8.4 8.4 - 10.4 mg/dL   Anion Gap 11 3 - 11 mEq/L   EGFR 41 (L) >90 ml/min/1.73 m2     Studies/Results: CT Abdomen Pelvis Wo Contrast 09/25/2015  [facility]  Result Narrative  EXAM: CT abdomen and pelvis without contrast DATE: 09/25/15 09:39:00 ACCESSION: [id number] UN DICTATED: 09/25/15 09:56:49 INTERPRETATION  LOCATION: [place]  CLINICAL INDICATION: 60 Year Old (F): ABDOMINAL PAIN, has drain in RLQ, looking for any other abscess pockets. Per chart review, recent fluoroscopic fistula study demonstrated fistula between right lower quadrant fluid collection and adjacent loops of bowel. Pertinent history includes metastatic high grade endometrial stromal sarcoma.  COMPARISON: Thorascopic fistula/sinus tract study 09/23/15, CT abdomen and pelvis 09/12/15.  TECHNIQUE: A spiral CT scan was obtained without IV or oral contrast from the lung bases to the pubic symphysis.  Images were reconstructed in the axial plane. Coronal and sagittal reformatted images were also provided for further evaluation.  FINDINGS: Evaluation of the solid organs and vasculature is limited in the absence of intravenous contrast.  LUNG BASES: Platelike atelectasis versus scarring in the right greater than left lung bases. CARDIOVASCULAR:  The distalmost portion of a central venous catheter is minimally enlarged, tip in the distal SVC. Mitral annular calcification. Hypoattenuating mediastinal blood pool suggests anemia.  Minimal atherosclerotic calcification of the aorta and its branch vessels. The aorta and its branch vessels appear normal in caliber.  The IVC is normal in caliber. LIVER:  Unremarkable. GALLBLADDER:  Contracted. SPLEEN:  Enlarged measuring 14 cm. PANCREAS:  Unremarkable. ADRENAL GLANDS:  Unremarkable. KIDNEYS:  No hydronephrosis or urolithiasis. There is no perinephric stranding. Malrotated right kidney. Mild left hydronephrosis is similar to prior, possibly due to a prominent lymph node just anterior to the left psoas muscle (3:62). PELVIS: The uterus and ovaries are surgically absent. The bladder is unremarkable. FREE AIR OR FREE FLUID:  Small volume free fluid. See the GI section below regarding RLQ collection with tiny locules of internal gas. No pneumoperitoneum. GASTROINTESTINAL:  Post surgical sequelae of  gastric sleeve resection, ileocolonic bypass and transverse colon mucous fistula. Right lower quadrant ostomy without parastomal hernia. Percutaneous drain coils within a large fluid collection in the right lower quadrant adjacent to the ileocolic anastomosis, with a small amount of internal contrast and gas locules within this fluid collection. Contrast is likely from recent fluoroscopic fistula study. This collection is irregular in shape and interdigitates between adjacent loops of bowel; this measures approximately 11.8 x 6.7 cm, mildly increased when measured  similarly (3:72; measurement includes a loop of bowel). Contrast also extends to the adjacent colon, consistent with fistulization. The bowel proximal to the right lower quadrant ostomy now appears mildly thickwalled, although lack of distention limits evaluation. Interval decrease in small bowel dilation, without evidence of bowel obstruction. LYMPHADENOPATHY/MESENTERY/RETROPERITONEUM:  Retroperitoneal and pelvic adenopathy is overall similar to prior CT (13 days prior). Reference lesions include: -- 2.3 cm retroperitoneal lymph node adjacent to the left psoas muscle is similar to prior; measurement is approximate due to lack of IV contrast (3:52).  -- Left pelvic sidewall lymph node measures 2.4 cm, similar to prior (3:78). -- 3 cm aortocaval node posterior to the third portion of the duodenum. EXTERNAL SOFT TISSUES: Midline surgical scar, with skin staples in place. BONES: No acute osseous abnormality or lytic or blastic osseous lesion. Multilevel degenerative disc disease, with vacuum disc phenomenon at L2-L3.   IMPRESSION:  -- Right lower quadrant fluid collection is mildly increased in size despite the presence of a percutaneous pigtail catheter in place. Findings consistent with fistulization to the adjacent small bowel or cecal tip. Mild wall thickening of the adjacent bowel is likely reactive. -- Mild left hydronephrosis likely related  to retroperitoneal adenopathy. -- Retroperitoneal and pelvic lymphadenopathy with minimal short interval change.      Medications: I have reviewed the patient's current medications. Needs to resume hemocyte if able to tolerate  DISCUSSION Interval history as best possible from patient and family during visit and supplemented by additional EMR information available after visit.  She is very focused on resuming treatment for the metastatic endometrial stromal sarcoma, which we had planned with adriamycin and olaratumab prior to bowel perforations/ abscesses. She understands that she is not well enough yet to safely attempt this. We have discussed trying to optimize nutrition, increasing activity, completing present antibiotics. It seems best for now to have [facility] IR address the JP as last imaging was at that institution (tho she is also well known to [facility] IR).  She appears symptomatic from this multifactorial anemia and agrees with PRBC transfusion, which we will try to set up for 10-01-17. Note siblings are leaving 09-24-15.   I will see her again in ~ 10 days with labs. Message to gyn onc as she may need post op visit in [place]. Will request [facility] dietician coordinate with other visit if possible.     Assessment/Plan: 1. Metastatic endometrial stromal sarcoma extensive in pelvis and involving T4 and tiny pulmonary nodules unclear significance (CT chest 07-2015), progressive on gemzar taxotere and post radiation to T4. No treatment available on MATCH trial thru [facility]. Plan had been for adriamycin + olaratumab, that not begun due to interval bowel perforation(s) with abdominal and pelvic abscesses. Now post surgeries x 2 with entero- enterostomy and mucous fistula. Very debilitated from complicated 2 months' hospitalization. JP drain in by [facility] IR, not draining. Goal now is to improve nutrition and PS as possible, consider treatment IF improves to that point 2.multifactorial anemia: symptomatic. Transfuse  PRBCs x 2 units in next 1-2 days 3.PAC in 4.at least moderate protein calorie malnutrition: now oral intake only. WIll ask [place] nutritionist to assist. Patient to add Boost Breeze now, which she has available at home 5. Elevated WBC without fever now: she had E coli in pelvic abscesses in 07-2015, GPC in 1/2 blood cultures 08-24-15, MSSA in blood cx at [facility] Present levaquin and flagyl I believe empiric choices, completing 14 day course.  6.degenerative arthritis knees very symptomatic prior to all  of recent complications 7.advance directives in place 8.social situation: lives alone, equidistant [place] and [place]. Caregiver arranged for several hours daily, HH nursing and PT involved for now 9.hx psoriasis and nonmelanoma skin ca 10. Hx morbid obesity and previous gastric banding  All questions answered as best I am able. Patient and family aware that we will let them know about PRBCs possibly tomorrow, as timing of visit too late to accomplish now. Messages to [facility] gyn onc and [place] nutritionist. Time spent >60 min including >50% counseling and coordination of care.  Route PCP   Gordy Levan, MD   10/01/2015, 8:39 PM

## 2015-10-01 NOTE — Patient Instructions (Signed)
Pt to have port needle deaccessed after CT scan today 02/03/15 before leaving [facility].  Implanted Port Home Guide An implanted port is a type of central line that is placed under the skin. Central lines are used to provide IV access when treatment or nutrition needs to be given through a person's veins. Implanted ports are used for long-term IV access. An implanted port may be placed because:   You need IV medicine that would be irritating to the small veins in your hands or arms.   You need long-term IV medicines, such as antibiotics.   You need IV nutrition for a long period.   You need frequent blood draws for lab tests.   You need dialysis.  Implanted ports are usually placed in the chest area, but they can also be placed in the upper arm, the abdomen, or the leg. An implanted port has two main parts:   Reservoir. The reservoir is round and will appear as a small, raised area under your skin. The reservoir is the part where a needle is inserted to give medicines or draw blood.   Catheter. The catheter is a thin, flexible tube that extends from the reservoir. The catheter is placed into a large vein. Medicine that is inserted into the reservoir goes into the catheter and then into the vein.  HOW WILL I CARE FOR MY INCISION SITE? Do not get the incision site wet. Bathe or shower as directed by your health care provider.  HOW IS MY PORT ACCESSED? Special steps must be taken to access the port:   Before the port is accessed, a numbing cream can be placed on the skin. This helps numb the skin over the port site.   Your health care provider uses a sterile technique to access the port.  Your health care provider must put on a mask and sterile gloves.  The skin over your port is cleaned carefully with an antiseptic and allowed to dry.  The port is gently pinched between sterile gloves, and a needle is inserted into the port.  Only "non-coring" port needles should be  used to access the port. Once the port is accessed, a blood return should be checked. This helps ensure that the port is in the vein and is not clogged.   If your port needs to remain accessed for a constant infusion, a clear (transparent) bandage will be placed over the needle site. The bandage and needle will need to be changed every week, or as directed by your health care provider.   Keep the bandage covering the needle clean and dry. Do not get it wet. Follow your health care provider's instructions on how to take a shower or bath while the port is accessed.   If your port does not need to stay accessed, no bandage is needed over the port.  WHAT IS FLUSHING? Flushing helps keep the port from getting clogged. Follow your health care provider's instructions on how and when to flush the port. Ports are usually flushed with saline solution or a medicine called heparin. The need for flushing will depend on how the port is used.   If the port is used for intermittent medicines or blood draws, the port will need to be flushed:   After medicines have been given.   After blood has been drawn.   As part of routine maintenance.   If a constant infusion is running, the port may not need to be flushed.  HOW LONG   WILL MY PORT STAY IMPLANTED? The port can stay in for as long as your health care provider thinks it is needed. When it is time for the port to come out, surgery will be done to remove it. The procedure is similar to the one performed when the port was put in.  WHEN SHOULD I SEEK IMMEDIATE MEDICAL CARE? When you have an implanted port, you should seek immediate medical care if:   You notice a bad smell coming from the incision site.   You have swelling, redness, or drainage at the incision site.   You have more swelling or pain at the port site or the surrounding area.   You have a fever that is not controlled with medicine. Document Released: 05/23/2005 Document Revised:  03/13/2013 Document Reviewed: 01/28/2013 [facility] Patient Information 2015 [place], [place]. This information is not intended to replace advice given to you by your health care provider. Make sure you discuss any questions you have with your health care provider.

## 2015-10-02 ENCOUNTER — Other Ambulatory Visit: Payer: Self-pay | Admitting: Oncology

## 2015-10-02 ENCOUNTER — Ambulatory Visit: Payer: Federal, State, Local not specified - PPO

## 2015-10-02 ENCOUNTER — Telehealth: Payer: Self-pay | Admitting: Oncology

## 2015-10-02 ENCOUNTER — Ambulatory Visit (HOSPITAL_BASED_OUTPATIENT_CLINIC_OR_DEPARTMENT_OTHER): Payer: Federal, State, Local not specified - PPO

## 2015-10-02 ENCOUNTER — Ambulatory Visit (HOSPITAL_COMMUNITY)
Admission: RE | Admit: 2015-10-02 | Discharge: 2015-10-02 | Disposition: A | Discharge status: Home / Self Care | Payer: Federal, State, Local not specified - PPO | Source: Ambulatory Visit | Attending: Oncology | Admitting: Oncology

## 2015-10-02 VITALS — BP 142/69 | HR 90 | Temp 98.5°F | Resp 18

## 2015-10-02 DIAGNOSIS — D649 Anemia, unspecified: Secondary | ICD-10-CM

## 2015-10-02 DIAGNOSIS — D630 Anemia in neoplastic disease: Secondary | ICD-10-CM

## 2015-10-02 DIAGNOSIS — C541 Malignant neoplasm of endometrium: Secondary | ICD-10-CM

## 2015-10-02 DIAGNOSIS — Z95828 Presence of other vascular implants and grafts: Secondary | ICD-10-CM

## 2015-10-02 LAB — PREPARE RBC (CROSSMATCH)

## 2015-10-02 MED ORDER — SODIUM CHLORIDE 0.9% FLUSH
10.0000 mL | INTRAVENOUS | Status: DC | PRN
  Filled 2015-10-02: qty 10

## 2015-10-02 MED ORDER — HEPARIN SOD (PORK) LOCK FLUSH 100 UNIT/ML IV SOLN
500.0000 [IU] | Freq: Every day | INTRAVENOUS | Status: DC | PRN
  Filled 2015-10-02: qty 5

## 2015-10-02 MED ORDER — ACETAMINOPHEN 325 MG PO TABS
ORAL_TABLET | ORAL | Status: AC
  Filled 2015-10-02: qty 1

## 2015-10-02 MED ORDER — ACETAMINOPHEN 325 MG PO TABS
325.0000 mg | ORAL_TABLET | Freq: Once | ORAL | Status: AC
  Administered 2015-10-02: 325 mg via ORAL

## 2015-10-02 MED ORDER — SODIUM CHLORIDE 0.9% FLUSH
10.0000 mL | INTRAVENOUS | Status: AC | PRN
  Filled 2015-10-02: qty 10

## 2015-10-02 MED ORDER — SODIUM CHLORIDE 0.9 % IV SOLN
250.0000 mL | Freq: Once | INTRAVENOUS | Status: AC
  Administered 2015-10-02: 250 mL via INTRAVENOUS

## 2015-10-02 NOTE — Telephone Encounter (Signed)
per pof to sch appt-MW sch blood-Type & cross in lab notes-pt aware

## 2015-10-02 NOTE — Patient Instructions (Signed)

## 2015-10-04 ENCOUNTER — Other Ambulatory Visit: Payer: Self-pay | Admitting: Oncology

## 2015-10-04 DIAGNOSIS — K631 Perforation of intestine (nontraumatic): Secondary | ICD-10-CM | POA: Insufficient documentation

## 2015-10-04 DIAGNOSIS — IMO0002 Reserved for concepts with insufficient information to code with codable children: Secondary | ICD-10-CM | POA: Insufficient documentation

## 2015-10-04 DIAGNOSIS — R5381 Other malaise: Secondary | ICD-10-CM | POA: Insufficient documentation

## 2015-10-04 DIAGNOSIS — C541 Malignant neoplasm of endometrium: Secondary | ICD-10-CM

## 2015-10-04 DIAGNOSIS — N739 Female pelvic inflammatory disease, unspecified: Secondary | ICD-10-CM | POA: Insufficient documentation

## 2015-10-05 ENCOUNTER — Telehealth: Payer: Self-pay

## 2015-10-05 LAB — TYPE AND SCREEN
ABO/RH(D): O POS
Antibody Screen: NEGATIVE
UNIT DIVISION: 0
Unit division: 0

## 2015-10-05 NOTE — Telephone Encounter (Signed)
-----   Message from Gordy Levan, MD sent at 10/04/2015  1:32 PM EDT ----- Also earlier message asking RN to check on her by phone this week  Please find out when she is to be seen by [facility] re the RLQ drain. Ask if any output from that drain now. Ask if she has a contact # for [facility]   Please ask if she has any other [facility] appointments set up  Thank you

## 2015-10-05 NOTE — Telephone Encounter (Signed)
[name] states she feels less SOB after transfusion 10-02-15.  She is eating small amounts at a time.  Drinking fluids but will try to log the ounces so she has a more accurate record of fluid intake. She has to be careful not to over exert herself. She has people coming in to check on her through out the day. [name] has an appointment with [facility] IR on May 8,2017 at 1430 with a 1330 arrival time.  [facility] IR's phone number is [phone].  The out put from the  JP drain is very minimal.  It is not measurable. She has an appointment with Dr. Clarene Essex on May 22-17 at 1445 at [facility]. She is to see Ernestene Kiel tomorrow 10-06-15 for a nutritional consult at 1515. Follow up with Dr. Marko Plume on 10-19-15 at 1500.

## 2015-10-06 ENCOUNTER — Ambulatory Visit: Payer: Federal, State, Local not specified - PPO | Admitting: Nutrition

## 2015-10-06 NOTE — Progress Notes (Signed)
60 year old female diagnosed with endometrial cancer.  She is a patient of Dr. Marko Plume.  Past medical history includes anemia and radiation therapy.  Medications include Questran, Protonix, Lomotil, Zofran, magnesium oxide.  Labs were reviewed.  Height: 60 inches. Weight: 211.9 pounds on April 27. Usual body weight: 220 pounds. BMI: 41.38.  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  Patient had presented to the ED with complaints of 1-2 day history of diffuse abdominal pain. 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 She was initially hospitalized at [facility] from 2-23 thru 08-27-15, with multiple IR drainage procedures, then transferred to [facility] from 3-23 thru ~ 09-28-15.   Patient was on TPN in the hospital but is now on regular diet. She complains of poor appetite and increased gas.  States she has a minimum of 2 stools daily. Reports she is giving up dairy foods. 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  Nutrition diagnosis: Inadequate oral intake related to altered GI function as evidenced by recent surgeries.  Intervention:  Patient was educated to consume small frequent meals and snacks with high calorie foods to promote weight maintenance. Reviewed appropriate high-protein foods with patient and provided fact sheet. Educated patient on foods that can cause increased gas and provided fact sheet. Encouraged patient to continue drinking boost breeze. I provided additional samples. Questions were answered.  Teach back method was used.  Monitoring, evaluation, goals: Patient will tolerate adequate calories and protein to promote healing and minimize loss of lean body mass.  Next visit:  Patient will call me for further questions.  **Disclaimer: This note was dictated with voice recognition software. Similar sounding words can inadvertently be transcribed and this note may contain transcription errors which may not have been corrected upon publication of note.**

## 2015-10-18 ENCOUNTER — Other Ambulatory Visit: Payer: Self-pay | Admitting: Oncology

## 2015-10-19 ENCOUNTER — Telehealth: Payer: Self-pay | Admitting: Oncology

## 2015-10-19 ENCOUNTER — Ambulatory Visit (HOSPITAL_BASED_OUTPATIENT_CLINIC_OR_DEPARTMENT_OTHER): Payer: Federal, State, Local not specified - PPO | Admitting: Oncology

## 2015-10-19 ENCOUNTER — Encounter: Payer: Self-pay | Admitting: Oncology

## 2015-10-19 ENCOUNTER — Other Ambulatory Visit (HOSPITAL_BASED_OUTPATIENT_CLINIC_OR_DEPARTMENT_OTHER): Payer: Federal, State, Local not specified - PPO

## 2015-10-19 ENCOUNTER — Ambulatory Visit (HOSPITAL_BASED_OUTPATIENT_CLINIC_OR_DEPARTMENT_OTHER): Payer: Federal, State, Local not specified - PPO

## 2015-10-19 VITALS — BP 125/75 | HR 90 | Temp 98.8°F | Resp 18 | Ht 60.0 in | Wt 196.1 lb

## 2015-10-19 DIAGNOSIS — C541 Malignant neoplasm of endometrium: Secondary | ICD-10-CM | POA: Diagnosis not present

## 2015-10-19 DIAGNOSIS — C7951 Secondary malignant neoplasm of bone: Secondary | ICD-10-CM

## 2015-10-19 DIAGNOSIS — N739 Female pelvic inflammatory disease, unspecified: Secondary | ICD-10-CM

## 2015-10-19 DIAGNOSIS — Z452 Encounter for adjustment and management of vascular access device: Secondary | ICD-10-CM

## 2015-10-19 DIAGNOSIS — B370 Candidal stomatitis: Secondary | ICD-10-CM

## 2015-10-19 DIAGNOSIS — Z95828 Presence of other vascular implants and grafts: Secondary | ICD-10-CM

## 2015-10-19 DIAGNOSIS — C7989 Secondary malignant neoplasm of other specified sites: Secondary | ICD-10-CM

## 2015-10-19 DIAGNOSIS — C772 Secondary and unspecified malignant neoplasm of intra-abdominal lymph nodes: Secondary | ICD-10-CM

## 2015-10-19 DIAGNOSIS — D500 Iron deficiency anemia secondary to blood loss (chronic): Secondary | ICD-10-CM

## 2015-10-19 DIAGNOSIS — R634 Abnormal weight loss: Secondary | ICD-10-CM

## 2015-10-19 DIAGNOSIS — IMO0002 Reserved for concepts with insufficient information to code with codable children: Secondary | ICD-10-CM

## 2015-10-19 DIAGNOSIS — E440 Moderate protein-calorie malnutrition: Secondary | ICD-10-CM

## 2015-10-19 LAB — COMPREHENSIVE METABOLIC PANEL
ALK PHOS: 177 U/L — AB (ref 40–150)
ALT: 32 U/L (ref 0–55)
AST: 21 U/L (ref 5–34)
Albumin: 2.9 g/dL — ABNORMAL LOW (ref 3.5–5.0)
Anion Gap: 9 mEq/L (ref 3–11)
BUN: 15.6 mg/dL (ref 7.0–26.0)
CALCIUM: 9.1 mg/dL (ref 8.4–10.4)
CHLORIDE: 105 meq/L (ref 98–109)
CO2: 22 mEq/L (ref 22–29)
Creatinine: 1.2 mg/dL — ABNORMAL HIGH (ref 0.6–1.1)
EGFR: 48 mL/min/{1.73_m2} — AB (ref >90)
Glucose: 106 mg/dl (ref 70–140)
POTASSIUM: 3.8 meq/L (ref 3.5–5.1)
Sodium: 135 mEq/L — ABNORMAL LOW (ref 136–145)
Total Bilirubin: 0.78 mg/dL (ref 0.20–1.20)
Total Protein: 7.4 g/dL (ref 6.4–8.3)

## 2015-10-19 LAB — CBC WITH DIFFERENTIAL/PLATELET
BASO%: 0.2 % (ref 0.0–2.0)
BASOS ABS: 0 10*3/uL (ref 0.0–0.1)
EOS%: 5.1 % (ref 0.0–7.0)
Eosinophils Absolute: 1.4 10*3/uL — ABNORMAL HIGH (ref 0.0–0.5)
HEMATOCRIT: 27.8 % — AB (ref 34.8–46.6)
HGB: 9.2 g/dL — ABNORMAL LOW (ref 11.6–15.9)
LYMPH%: 2.8 % — AB (ref 14.0–49.7)
MCH: 28.5 pg (ref 25.1–34.0)
MCHC: 33.1 g/dL (ref 31.5–36.0)
MCV: 86.1 fL (ref 79.5–101.0)
MONO#: 1.7 10*3/uL — AB (ref 0.1–0.9)
MONO%: 6.3 % (ref 0.0–14.0)
NEUT#: 22.6 10*3/uL — ABNORMAL HIGH (ref 1.5–6.5)
NEUT%: 85.6 % — AB (ref 38.4–76.8)
Platelets: 373 10*3/uL (ref 145–400)
RBC: 3.23 10*6/uL — ABNORMAL LOW (ref 3.70–5.45)
RDW: 16.2 % — ABNORMAL HIGH (ref 11.2–14.5)
WBC: 26.4 10*3/uL — ABNORMAL HIGH (ref 3.9–10.3)
lymph#: 0.8 10*3/uL — ABNORMAL LOW (ref 0.9–3.3)
nRBC: 0 % (ref 0–0)

## 2015-10-19 MED ORDER — HEPARIN SOD (PORK) LOCK FLUSH 100 UNIT/ML IV SOLN
500.0000 [IU] | Freq: Once | INTRAVENOUS | Status: AC
Start: 2015-10-19 — End: 2015-10-19
  Administered 2015-10-19: 500 [IU] via INTRAVENOUS
  Filled 2015-10-19: qty 5

## 2015-10-19 MED ORDER — FLUCONAZOLE 100 MG PO TABS: 100.0000 mg | ORAL_TABLET | Freq: Every day | ORAL | Status: DC

## 2015-10-19 MED ORDER — SODIUM CHLORIDE 0.9% FLUSH
10.0000 mL | INTRAVENOUS | Status: DC | PRN
  Administered 2015-10-19: 10 mL via INTRAVENOUS
  Filled 2015-10-19: qty 10

## 2015-10-19 MED ORDER — SUCRALFATE 1 GM/10ML PO SUSP: 1.0000 g | Freq: Three times a day (TID) | ORAL | Status: DC

## 2015-10-19 MED ORDER — FERROUS FUMARATE 325 (106 FE) MG PO TABS: 1.0000 | ORAL_TABLET | Freq: Every day | ORAL | Status: AC

## 2015-10-19 NOTE — Progress Notes (Signed)
OFFICE PROGRESS NOTE   Oct 19, 2015   Physicians:Emma Rossi/ [name]/ John Clarene Essex, Cathe [facility]),[name] (PCP [facility]), [name]; [name], [name]  INTERVAL HISTORY:   Patient is seen, alone for visit tho brought to office by friend, in continuing follow up of metastatic endometrial stromal sarcoma, having had complicated course since 07-2015 with abdominal and pelvic abscesses, 2 month hospitalization [facility] and [facility] (2-23 thru 09-28-15), and 2 surgeries at [facility]. She is now at home alone, with [facility] PT 3x weekly  [place] RN 2x weekly and assistance from friends.  She had right sided abdominal drain replaced by [facility] IR on 10-12-15 as the previous drain had not functioned in a few weeks; the new drain was inadvertently pulled out on 10-15-15. Patient contacted [facility] IR, who asked her to discuss with me at this visit.  She is to see [name] on 10-26-15 at [facility]. She has had some minimal bleeding from the site of previous drain. She fell at home ~ 10-13-15, life alert button worked for assistance. 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 She has had more frank blood into the mucous fistula bag after that fall, tho no clear trauma at that area.  She has held prophylactic post op lovenox since drain replacement 10-12-15.  Last imaging was CT AP [facility] 09-25-15 and the IR drain exchange 10-12-15.  She has not been stable to proceed with any systemic treatment for the metastatic disease (planned adriamycin + olaratumab in Feb not begun due to complications, other option doxil).   Patient was transfused 2 units PRBCs at [facility] on 10-02-15, for hemoglobin 7.5 on 10-01-15. She did feel some better after the transfusion, but still generally weak. She feels "fullness" in low abdomen  and pelvis,  which is uncomfortable but not painful. She has early satiety and much decreased appetite; she has been able to eat 1/2 of English muffin, bread, soy milk, bananas, spaghetti. She decreased Boost Breeze from 3-4 daily to 2 daily, ,but will increase this again with progressive weight loss now. She has had cough and nasal drainage x 10 days, improving tho still NP cough, no fever, no SOB and several friends with same symptoms. She has had dry heaves after coughing, but no frank nausea otherwise, does have some possible GERD and would like to resume carafate. Bowels are moving multiple soft stools daily. She is ambulatory in home, [facility] PT as above. Swelling in LE much improved. She is voiding without difficulty. No new or different pain otherwise. No problems with PAC. She is able to sleep. Remainder of 10 point Review of Systems negative/ unchanged.   PAC flushed 10-19-15  ONCOLOGIC HISTORY Patient had been menopausal since age 30 until she had what seemed to her to be a menstrual period in Sept 2015, with large blood clot at completion of bleeding stopped then. She continued with lesser bleeding over next several months until she was seen in 06-2014 by [name]. Hemoglobin was 12.7 on 07-04-14. CT CAP 06-25-14 had uterus 16.4 x 13.5 x 14.4 cm with marked expansion of endometrial canal by heterogeneous mass, bilateral pelvic sidewall adenopathy, iliac adenopathy with no definite retroperitoneal or mesenteric adenopathy, no ascites, no liver mets. Attempted endometrial sampling 06-26-14 and 07-03-14 was nondiagnostic; around that time the  patient was passing clots and using one large maxipad hourly. She was seen by [name] 07-05-14, with uterine fundus palpable above umbilicus. She had surgery by [name] at [facility] on 07-14-14, which was exploratory laparotomy with TAH BSO, bilateral total pelvic lymphadenectomy and sampling of aortic and renal nodes. Pathology [facility] [phone]) found high grade  endometrial stromal sarcoma with primary 18 cm, 8/45 nodes involved including 2 right pelvic and 6 left pelvic nodes, ER PR negative. Case was presented at [facility] multidisciplinary conference 07-23-14, with recommendation for PET CT to evaluate inguinal and portahepatis nodes, and to consider adjuvant gemzar taxotere and possibly follow with 4 cycles of adriamycin, then to consider whole pelvic RT. She saw [name] for post op follow up on 07-28-14, with recommendation for gemzar taxotere and adriamycin, whole pelvic RT after chemo and consideration of Megace maintenance after chemo (possibly prior to negative ER PR information). PET 08-01-14 had some uptake in aortocaval and left paraaortic regions. She had day 1 cycle 1 gemzar taxotere on 08-28-14, day 8 cycle 1 on 09-04-14 and neulasta on 09-06-14. Admitted with neutropenic fever on day 14 cycle 1, with oral mucositis and some diarrhea. Counts maintained cycle 2 using OnPro neulasta day 9. She had progressive LE swelling after cycle 3, with venous dopplers negative 10-23-14, then LE swelling and SOB so marked after day 1 cycle 4 10-30-14 that chemo was held. Restaging CT AP + CXR 11-10-14 had stable tiny left pulmonary nodule/ no pleural effusion and normal heart size; CT had necrotic aortocaval adenopathy, iliac adenopathy L>R and 7x10 cm fluid collection in pelvis. She received IMRT 45 gray in 25 fractions to pelvis from 6-27 thru 01-05-15, with resolution of LE swelling by completion of course. CT CAP 02-03-15 showed improvement in retroperitoneal and pelvic involvement. She went onto observation after RT, plan to wait until clear progressive disease before resuming treatment, possibly adriamycin vs on study. CT CAP 05-07-15 showed new lytic lesion at T4, stable 2-3 mm pulmonary nodules, increased left pelvic and retroperitoneal nodes and RLQ peritoneal nodule. Radiation therapy summary as follows: Indication for treatment: A new bone lesion in the right side of the T4  vertebral body, with soft tissue extension slightly impinging upon the central spinal canal, some pain from this lesion Radiation treatment dates: 05/18/2015 through 06/05/2015 Site/dose: T4 thoracic spine area, 35 gray in 14 fractions She was not eligible for any of the arms of MATCH trial by evaluation in Jan 2017. Plan was to begin treatment with adriamycin + olaratumab, however this not begun prior to hospitalization 07-30-15 with abdominal/ pelvic abscess. She was hospitalized at [facility] from 2-23 thru 08-27-15 with bowel fistulas and abdominopelvic abscesses. She was transferred to [facility] on 08-27-15 and remained hospitalized there thru ~ 09-28-15, with 2 surgeries for bowel involvement with malignancy.    Objective:  Vital signs in last 24 hours:  BP 125/75 mmHg  Pulse 90  Temp(Src) 98.8 F (37.1 C) (Oral)  Resp 18  Ht 5' (1.524 m)  Wt 196 lb 1.6 oz (88.95 kg)  BMI 38.30 kg/m2  SpO2 100%  LMP 02/04/2014  weight is down 15 lbs from 10-01-15, tho some of this appears to be mobilization of fluid with LE swelling nearly resolved.  Alert, oriented and appropriate. Not as pale as at last visit. Looks generally tired, respirations not labored tho intermittent cough, not in acute discomfort. Using WC for office.   HEENT:PERRL, sclerae not icteric. Oral mucosa moist, thick white coating on  tongue likely candida, posterior pharynx slight dull erythema without exudate. Sounds slightly nasally congested, nasal turbinates without purulent drainage.  Neck supple. No JVD.  Lymphatics:probable left supraclavicular adenopathy not tender Resp: clear to auscultation bilaterally and normal percussion bilaterally Cardio: regular rate and rhythm. No gallop.Clear heart sounds. GI: soft, nontender, full but not tightly distended. Not tender epigastrium.  A few bowel sounds. Frank blood in mucous fistula bag estimated 30 cc, bag having been changed this AM. Incision closed with two <0.5 cm areas of  eschar upper incision.  Musculoskeletal/ Extremities: trace pedal edema without cords, tenderness Neuro: no change peripheral neuropathy. Speech fluent and appropriate, no obvious CN deficits, moves all extremities easily in WC. PSYCH mood and affect seem a little better, cooperative and pleasant Skin without rash, ecchymosis, petechiae Portacath-without erythema or tenderness, flushed without difficulty today.  Lab Results:  Results for orders placed or performed in visit on 10/19/15  CBC with Differential  Result Value Ref Range   WBC 26.4 (H) 3.9 - 10.3 10e3/uL   NEUT# 22.6 (H) 1.5 - 6.5 10e3/uL   HGB 9.2 (L) 11.6 - 15.9 g/dL   HCT 27.8 (L) 34.8 - 46.6 %   Platelets 373 145 - 400 10e3/uL   MCV 86.1 79.5 - 101.0 fL   MCH 28.5 25.1 - 34.0 pg   MCHC 33.1 31.5 - 36.0 g/dL   RBC 3.23 (L) 3.70 - 5.45 10e6/uL   RDW 16.2 (H) 11.2 - 14.5 %   lymph# 0.8 (L) 0.9 - 3.3 10e3/uL   MONO# 1.7 (H) 0.1 - 0.9 10e3/uL   Eosinophils Absolute 1.4 (H) 0.0 - 0.5 10e3/uL   Basophils Absolute 0.0 0.0 - 0.1 10e3/uL   NEUT% 85.6 (H) 38.4 - 76.8 %   LYMPH% 2.8 (L) 14.0 - 49.7 %   MONO% 6.3 0.0 - 14.0 %   EOS% 5.1 0.0 - 7.0 %   BASO% 0.2 0.0 - 2.0 %   nRBC 0 0 - 0 %  Comprehensive metabolic panel  Result Value Ref Range   Sodium 135 (L) 136 - 145 mEq/L   Potassium 3.8 3.5 - 5.1 mEq/L   Chloride 105 98 - 109 mEq/L   CO2 22 22 - 29 mEq/L   Glucose 106 70 - 140 mg/dl   BUN 15.6 7.0 - 26.0 mg/dL   Creatinine 1.2 (H) 0.6 - 1.1 mg/dL   Total Bilirubin 0.78 0.20 - 1.20 mg/dL   Alkaline Phosphatase 177 (H) 40 - 150 U/L   AST 21 5 - 34 U/L   ALT 32 0 - 55 U/L   Total Protein 7.4 6.4 - 8.3 g/dL   Albumin 2.9 (L) 3.5 - 5.0 g/dL   Calcium 9.1 8.4 - 10.4 mg/dL   Anion Gap 9 3 - 11 mEq/L   EGFR 48 (L) >90 ml/min/1.73 m2    Hemoglobin reflects 2 units PRBCs given 10-02-15, up from 7.5 prior to transfusion.   Studies/Results: From [facility] system 10-12-15: EXAM: SINOGRAM WITH  REPOSITIONING/EXCHANGE OF A DRAINAGE CATHETER DATE: 10/12/2015 3:16 PM ACCESSION: [id number] DICTATED: 10/13/2015 9:39 AM INTERPRETATION LOCATION: Main Campus  CLINICAL INDICATION: 60 years old Female with right lower quadrant hematoma presents for evaluation of an indwelling drain.   Drain is clogged and the patient is unable to flush at this time.  CONSENT: Informed consent was obtained from the patient including a discussion of the alternatives, benefits, and risks including but not limited to infection, bleeding, need for additional procedure, and/or ICU admission.  DRAIN EXCHANGE: The skin surrounding the drain entry site and the drain catheter itself were prepped and draped using all elements of maximal sterile barrier technique. 1% lidocaine was used for local anesthesia.   After cutting the end of the existing drain to release the pigtail, a 0.035-inch stiff guidewire was inserted through the drain into the cavity under fluoroscopy. A 5-Fr Kumpe catheter was used to direct the wire into the center of the collection.   With the patient in the supine position, the Kumpe catheter was gently injected with contrast under fluoroscopy. Demonstrating a persistent hematoma with colonic fistula present. Viscous bloody drainage was noted.  The catheter was exchanged for a new locking 14-F pigtail drain. By fluoroscopy, the pigtail of the new drain was positioned in the central aspect of the abscess/fluid collection. 1 mg of TPA was infused via the catheter.  The tube was placed to JP drainage, secured to the skin with a Stat-Lock, suture and covered with a sterile dressing.   SEDATION: I personally spent 18 minutes continuously monitoring the patient face-to-face during the administration of moderate sedation. Pre and Post Sedation activities have been reviewed.  FLUOROSCOPY TIME: 1.5 minutes EXPOSURES: 0 CONTRAST: 15 mL CUMULATIVE DOSE: 10 mGy    IMPRESSION: Successful repositioning and  exchange for a new 14-F locking pigtail catheter in a right lower quadrant hematoma. The patient will flush with 10 cc 3 a day and return in 4 weeks for a repeat sinogram. If the collection continues to be poorly drained, discussion can be had if percutaneous drainage should be continued.  Medications: I have reviewed the patient's current medications. DC prophylactic lovenox due to intermittent bleeding from mucous fistula (has not used this since 10-12-15 drain replacement). WIll decrease magnesium from present 800 mg bid to 400 mg bid due to frequency of stools, and check magnesium with next labs here; K good at 3.8 today. Add diflucan 100 mg daily x7 Will use either Hemocyte or OTC ferrous fumarate one daily Resume carafate as previously.    DISCUSSION  Particular new problems now are frank blood intermittently from mucous fistula (tho history not clear and this may not be new in past week) ,  slight increase in low abdomen / pelvis symptoms since drain replaced 5-8 came out inadvertently on 10-15-15, probable respiratory infection (improving). Also weight loss 15 lbs in last 2 weeks which seems in part fluid; she met with [facility] nutritionist on 10-06-15 and will increase Boost Breeze back to 3-4 daily now. Resolving respiratory infection likely viral. She had apparent tiny pulmonary mets by chest CT 07-2015 Medications as above. She is to see [name] on 10-26-15. We will contact [facility] to let them know situation including blood from mucous fistula and drain out, as they may want CT at [facility] prior to his visit.  PS not adequate yet for chemo, also concerns re bleeding as above. I mentioned that doxil likely would be easier to tolerate than adria/ olaratumab, so that may be a consideration when I see her again in ~ 2 weeks. Echocardiogrram 07-23-15 EF 60-65%     Assessment/Plan:  1. 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 No treatment available on MATCH trial thru [facility]. Plan had been for adriamycin + olaratumab,  not begun due to interval bowel perforation(s) with abdominal and pelvic abscesses. Now post surgeries x 2 with entero- enterostomy  and mucous fistula. Very debilitated from complications and 2 month hospitalization. She would still like to try more systemic treatment if able, see consideration of doxil above.  2.JP drain by IR at [facility] replaced 10-12-15 then inadvertently pulled out 10-15-15. Some symptoms of fullness and pressure in low abdomen/ pelvis. May need repeat imaging prior to scheduled visit at [facility] 10-26-15 3.bleeding from mucous fistula: [facility] DC summary reports serosanguinous fluid.. Patient reports intermittent bleeding prior to this week, but may be increased. Will let [facility] know in case repeat scans or other evaluation there appropriate. Post 2 units PRBCs at this office 10-02-15. Oral iron. 4. Upper and lower respiratory symptoms x a week, improving, likely viral respiratory illness. Last CXRs did not show pulmonary mets, tiny areas questioned on last chest CT in Feb.  5. protein calorie malnutrition: now oral intake only.  [place] nutritionist has seen. Patient to increase Boost Breeze now. Weight loss 15 lbs since 10-01-15, in part fluid mobilization. 5. Completed 14 day course of levaquin and flagyl, afebrile.  Had E coli in pelvic abscesses in 07-2015, GPC in 1/2 blood cultures 08-24-15, MSSA in blood cx at [facility].  6.degenerative arthritis knees very symptomatic prior to all of recent complications 7.advance directives in place 8.social situation: lives alone, equidistant [place] and [place]. Caregiver arranged for several hours daily, HH nursing and PT involved for now 9.hx psoriasis and nonmelanoma skin ca 10. Hx morbid obesity and previous gastric banding 11. PAC in, flushed today 12.oral thrush: diflucan  All  questions answered. Time spent 35 min including >50% counseling and coordination of care.  Route PCP. CC Dr Jena Gauss, MD   10/19/2015, 3:23 PM

## 2015-10-19 NOTE — Telephone Encounter (Signed)
appt made and avs printed. LL 5/30 appt need to be added. Staff message sent to open schedule

## 2015-10-19 NOTE — Patient Instructions (Signed)
Pt to have port needle deaccessed after CT scan today 02/03/15 before leaving [facility].  Implanted [facility] Guide An implanted port is a type of central line that is placed under the skin. Central lines are used to provide IV access when treatment or nutrition needs to be given through a person's veins. Implanted ports are used for long-term IV access. An implanted port may be placed because:   You need IV medicine that would be irritating to the small veins in your hands or arms.   You need long-term IV medicines, such as antibiotics.   You need IV nutrition for a long period.   You need frequent blood draws for lab tests.   You need dialysis.  Implanted ports are usually placed in the chest area, but they can also be placed in the upper arm, the abdomen, or the leg. An implanted port has two main parts:   Reservoir. The reservoir is round and will appear as a small, raised area under your skin. The reservoir is the part where a needle is inserted to give medicines or draw blood.   Catheter. The catheter is a thin, flexible tube that extends from the reservoir. The catheter is placed into a large vein. Medicine that is inserted into the reservoir goes into the catheter and then into the vein.  HOW WILL I CARE FOR MY INCISION SITE? Do not get the incision site wet. Bathe or shower as directed by your health care provider.  HOW IS MY PORT ACCESSED? Special steps must be taken to access the port:   Before the port is accessed, a numbing cream can be placed on the skin. This helps numb the skin over the port site.   Your health care provider uses a sterile technique to access the port.  Your health care provider must put on a mask and sterile gloves.  The skin over your port is cleaned carefully with an antiseptic and allowed to dry.  The port is gently pinched between sterile gloves, and a needle is inserted into the port.  Only "non-coring" port needles should be  used to access the port. Once the port is accessed, a blood return should be checked. This helps ensure that the port is in the vein and is not clogged.   If your port needs to remain accessed for a constant infusion, a clear (transparent) bandage will be placed over the needle site. The bandage and needle will need to be changed every week, or as directed by your health care provider.   Keep the bandage covering the needle clean and dry. Do not get it wet. Follow your health care provider's instructions on how to take a shower or bath while the port is accessed.   If your port does not need to stay accessed, no bandage is needed over the port.  WHAT IS FLUSHING? Flushing helps keep the port from getting clogged. Follow your health care provider's instructions on how and when to flush the port. Ports are usually flushed with saline solution or a medicine called heparin. The need for flushing will depend on how the port is used.   If the port is used for intermittent medicines or blood draws, the port will need to be flushed:   After medicines have been given.   After blood has been drawn.   As part of routine maintenance.   If a constant infusion is running, the port may not need to be flushed.  HOW LONG  WILL MY PORT STAY IMPLANTED? The port can stay in for as long as your health care provider thinks it is needed. When it is time for the port to come out, surgery will be done to remove it. The procedure is similar to the one performed when the port was put in.  WHEN SHOULD I SEEK IMMEDIATE MEDICAL CARE? When you have an implanted port, you should seek immediate medical care if:   You notice a bad smell coming from the incision site.   You have swelling, redness, or drainage at the incision site.   You have more swelling or pain at the port site or the surrounding area.   You have a fever that is not controlled with medicine. Document Released: 05/23/2005 Document Revised:  03/13/2013 Document Reviewed: 01/28/2013 [facility] Patient Information 2015 [place], [place]. This information is not intended to replace advice given to you by your health care provider. Make sure you discuss any questions you have with your health care provider.

## 2015-10-22 ENCOUNTER — Telehealth: Payer: Self-pay

## 2015-10-22 NOTE — Telephone Encounter (Deleted)
Ms Okland states

## 2015-10-22 NOTE — Telephone Encounter (Signed)
Pt called with complaints of pain to abdomen where she had before in Jan and Feb. Spoke to pt and told her that Dr.Livesay would like her to go to ED to be eval as her mucous drain is draining blood also. Pt has appt with Dr. Clarene Essex 10/26/15 and prev surgery done at [facility]. Recommended  pt got to [facility] ED. Pt verbalized understanding.

## 2015-10-23 ENCOUNTER — Telehealth: Payer: Self-pay

## 2015-10-23 NOTE — Telephone Encounter (Signed)
Erin Santiago stated that the CT doe yesterday revealed that her cancer is active and doing bad things. She wants an appointment with Dr. Marko Plume asap to discuss chemotherapy. Told her that she is to see Dr. Clarene Essex on Monday 10-26-15 in follow up her surgery and disease.  He can review the scan with her and  discuss plan of care with her and make recommendations. The ED notes, lab results, and CT scan report will be obtained for Dr. Marko Plume to review  on 10-26-15  and determine follow up appointment. Erin Santiago states that the abdominal pain is becoming  more constant.  She has not taken any narcotic pain medication medication nor tylenol for the pain.  Pain level is ~2-3/10. Encouraged her to use medication if pain increases to a level of 5 or greater.  She should go to the ED at [facility] or [facility] if her symptoms worsen. Ms. Casaday verbalized understanding.

## 2015-10-27 ENCOUNTER — Telehealth: Payer: Self-pay | Admitting: *Deleted

## 2015-10-27 ENCOUNTER — Other Ambulatory Visit: Payer: Self-pay | Admitting: Oncology

## 2015-10-27 NOTE — Telephone Encounter (Signed)
Obtained Dr. Prudy Feeler office note from 10-26-15 and it is in EMR  Under media tab. Says 5-17 [facility] offic note  so  note can  reviewed  No specific chemotherapy noted.

## 2015-10-27 NOTE — Telephone Encounter (Signed)
"  I saw my surgeon yesterday.  Recommends chemotherapy right away.  I'm scheduled to see her 11-03-2015 but no chemotherapy is scheduled.  Calling to speed up starting treatment.  We talked about Doxil previously.  Return number [id number]."  Aware Dr. Nichola Sizer to office 10-29-2015 but I will message this request.

## 2015-10-27 NOTE — Telephone Encounter (Signed)
RN please do doxil teaching by phone Let me see her on 5-26 with first doxil same day if possible, otherwise it will have to be next week I will put in orders, POF, notify managed care now  thanks

## 2015-10-28 ENCOUNTER — Telehealth: Payer: Self-pay | Admitting: Oncology

## 2015-10-28 NOTE — Telephone Encounter (Signed)
Reviewed Doxil chemotherapy side effects and recommendations for management of skin side effects. Will give Erin Santiago handouts of information reviewed at visit 10-30-15.  Erin Santiago verbalized understanding.

## 2015-10-28 NOTE — Telephone Encounter (Signed)
left msg confirming 5/26 apts

## 2015-10-29 ENCOUNTER — Other Ambulatory Visit: Payer: Self-pay | Admitting: Oncology

## 2015-10-29 DIAGNOSIS — C541 Malignant neoplasm of endometrium: Secondary | ICD-10-CM

## 2015-10-30 ENCOUNTER — Other Ambulatory Visit (HOSPITAL_BASED_OUTPATIENT_CLINIC_OR_DEPARTMENT_OTHER): Payer: Federal, State, Local not specified - PPO

## 2015-10-30 ENCOUNTER — Encounter: Payer: Self-pay | Admitting: Oncology

## 2015-10-30 ENCOUNTER — Ambulatory Visit: Payer: Federal, State, Local not specified - PPO

## 2015-10-30 ENCOUNTER — Ambulatory Visit (HOSPITAL_BASED_OUTPATIENT_CLINIC_OR_DEPARTMENT_OTHER): Payer: Federal, State, Local not specified - PPO

## 2015-10-30 ENCOUNTER — Telehealth: Payer: Self-pay

## 2015-10-30 ENCOUNTER — Other Ambulatory Visit: Payer: Self-pay

## 2015-10-30 ENCOUNTER — Ambulatory Visit (HOSPITAL_BASED_OUTPATIENT_CLINIC_OR_DEPARTMENT_OTHER): Payer: Federal, State, Local not specified - PPO | Admitting: Oncology

## 2015-10-30 ENCOUNTER — Telehealth: Payer: Self-pay | Admitting: Oncology

## 2015-10-30 VITALS — BP 135/75 | HR 92 | Temp 99.3°F | Resp 18

## 2015-10-30 VITALS — BP 130/65 | HR 96 | Temp 99.3°F | Resp 19 | Wt 191.0 lb

## 2015-10-30 DIAGNOSIS — C7989 Secondary malignant neoplasm of other specified sites: Secondary | ICD-10-CM

## 2015-10-30 DIAGNOSIS — N1339 Other hydronephrosis: Secondary | ICD-10-CM | POA: Insufficient documentation

## 2015-10-30 DIAGNOSIS — R5381 Other malaise: Secondary | ICD-10-CM

## 2015-10-30 DIAGNOSIS — Z5111 Encounter for antineoplastic chemotherapy: Secondary | ICD-10-CM | POA: Diagnosis not present

## 2015-10-30 DIAGNOSIS — N1330 Unspecified hydronephrosis: Secondary | ICD-10-CM

## 2015-10-30 DIAGNOSIS — C541 Malignant neoplasm of endometrium: Secondary | ICD-10-CM

## 2015-10-30 DIAGNOSIS — E440 Moderate protein-calorie malnutrition: Secondary | ICD-10-CM

## 2015-10-30 DIAGNOSIS — E46 Unspecified protein-calorie malnutrition: Secondary | ICD-10-CM

## 2015-10-30 DIAGNOSIS — D500 Iron deficiency anemia secondary to blood loss (chronic): Secondary | ICD-10-CM

## 2015-10-30 DIAGNOSIS — E876 Hypokalemia: Secondary | ICD-10-CM

## 2015-10-30 DIAGNOSIS — C7951 Secondary malignant neoplasm of bone: Secondary | ICD-10-CM | POA: Diagnosis not present

## 2015-10-30 DIAGNOSIS — C772 Secondary and unspecified malignant neoplasm of intra-abdominal lymph nodes: Secondary | ICD-10-CM

## 2015-10-30 DIAGNOSIS — R634 Abnormal weight loss: Secondary | ICD-10-CM

## 2015-10-30 LAB — COMPREHENSIVE METABOLIC PANEL
ALT: 16 U/L (ref 0–55)
AST: 15 U/L (ref 5–34)
Albumin: 2.8 g/dL — ABNORMAL LOW (ref 3.5–5.0)
Alkaline Phosphatase: 155 U/L — ABNORMAL HIGH (ref 40–150)
Anion Gap: 11 mEq/L (ref 3–11)
BUN: 11.9 mg/dL (ref 7.0–26.0)
CALCIUM: 8.9 mg/dL (ref 8.4–10.4)
CHLORIDE: 104 meq/L (ref 98–109)
CO2: 19 meq/L — AB (ref 22–29)
Creatinine: 1.4 mg/dL — ABNORMAL HIGH (ref 0.6–1.1)
EGFR: 42 mL/min/{1.73_m2} — ABNORMAL LOW (ref >90)
Glucose: 107 mg/dl (ref 70–140)
POTASSIUM: 3.3 meq/L — AB (ref 3.5–5.1)
Sodium: 135 mEq/L — ABNORMAL LOW (ref 136–145)
Total Bilirubin: 1.06 mg/dL (ref 0.20–1.20)
Total Protein: 7.1 g/dL (ref 6.4–8.3)

## 2015-10-30 LAB — TECHNOLOGIST REVIEW

## 2015-10-30 LAB — CBC WITH DIFFERENTIAL/PLATELET
BASO%: 0.2 % (ref 0.0–2.0)
BASOS ABS: 0 10*3/uL (ref 0.0–0.1)
EOS%: 7.5 % — AB (ref 0.0–7.0)
Eosinophils Absolute: 2.1 10*3/uL — ABNORMAL HIGH (ref 0.0–0.5)
HEMATOCRIT: 26.4 % — AB (ref 34.8–46.6)
HGB: 8.5 g/dL — ABNORMAL LOW (ref 11.6–15.9)
LYMPH#: 0.5 10*3/uL — AB (ref 0.9–3.3)
LYMPH%: 1.9 % — AB (ref 14.0–49.7)
MCH: 27.8 pg (ref 25.1–34.0)
MCHC: 32.2 g/dL (ref 31.5–36.0)
MCV: 86.3 fL (ref 79.5–101.0)
MONO#: 1.7 10*3/uL — ABNORMAL HIGH (ref 0.1–0.9)
MONO%: 5.8 % (ref 0.0–14.0)
NEUT#: 24.1 10*3/uL — ABNORMAL HIGH (ref 1.5–6.5)
NEUT%: 84.6 % — AB (ref 38.4–76.8)
Platelets: 435 10*3/uL — ABNORMAL HIGH (ref 145–400)
RBC: 3.06 10*6/uL — AB (ref 3.70–5.45)
RDW: 15.6 % — ABNORMAL HIGH (ref 11.2–14.5)
WBC: 28.4 10*3/uL — ABNORMAL HIGH (ref 3.9–10.3)

## 2015-10-30 MED ORDER — HEPARIN SOD (PORK) LOCK FLUSH 100 UNIT/ML IV SOLN
500.0000 [IU] | Freq: Once | INTRAVENOUS | Status: AC | PRN
  Administered 2015-10-30: 500 [IU]
  Filled 2015-10-30: qty 5

## 2015-10-30 MED ORDER — ONDANSETRON HCL 8 MG PO TABS: 8.0000 mg | ORAL_TABLET | Freq: Three times a day (TID) | ORAL | Status: DC | PRN

## 2015-10-30 MED ORDER — SODIUM CHLORIDE 0.9% FLUSH
10.0000 mL | INTRAVENOUS | Status: DC | PRN
  Filled 2015-10-30: qty 10

## 2015-10-30 MED ORDER — POTASSIUM CHLORIDE CRYS ER 20 MEQ PO TBCR
20.0000 meq | EXTENDED_RELEASE_TABLET | Freq: Once | ORAL | Status: DC
  Filled 2015-10-30: qty 1

## 2015-10-30 MED ORDER — LORAZEPAM 0.5 MG PO TABS: 0.5000 mg | ORAL_TABLET | Freq: Four times a day (QID) | ORAL | Status: DC | PRN

## 2015-10-30 MED ORDER — SODIUM CHLORIDE 0.9 % IV SOLN
10.0000 mg | Freq: Once | INTRAVENOUS | Status: AC
  Administered 2015-10-30: 10 mg via INTRAVENOUS
  Filled 2015-10-30: qty 1

## 2015-10-30 MED ORDER — ONDANSETRON HCL 8 MG PO TABS: 8.0000 mg | ORAL_TABLET | Freq: Three times a day (TID) | ORAL | Status: AC | PRN

## 2015-10-30 MED ORDER — SODIUM CHLORIDE 0.9% FLUSH
10.0000 mL | INTRAVENOUS | Status: DC | PRN
Start: 2015-10-30 — End: 2015-10-30
  Administered 2015-10-30: 10 mL
  Filled 2015-10-30: qty 10

## 2015-10-30 MED ORDER — HEPARIN SOD (PORK) LOCK FLUSH 100 UNIT/ML IV SOLN
500.0000 [IU] | Freq: Once | INTRAVENOUS | Status: DC
  Filled 2015-10-30: qty 5

## 2015-10-30 MED ORDER — DEXTROSE 5 % IV SOLN
31.0000 mg/m2 | Freq: Once | INTRAVENOUS | Status: AC
  Administered 2015-10-30: 60 mg via INTRAVENOUS
  Filled 2015-10-30: qty 30

## 2015-10-30 NOTE — Patient Instructions (Signed)
[facility] Discharge Instructions for Patients Receiving Chemotherapy  Today you received the following chemotherapy agents: Doxil.  To help prevent nausea and vomiting after your treatment, we encourage you to take your nausea medication as directed.  If you develop nausea and vomiting that is not controlled by your nausea medication, call the clinic.   BELOW ARE SYMPTOMS THAT SHOULD BE REPORTED IMMEDIATELY:  *FEVER GREATER THAN 100.5 F  *CHILLS WITH OR WITHOUT FEVER  NAUSEA AND VOMITING THAT IS NOT CONTROLLED WITH YOUR NAUSEA MEDICATION  *UNUSUAL SHORTNESS OF BREATH  *UNUSUAL BRUISING OR BLEEDING  TENDERNESS IN MOUTH AND THROAT WITH OR WITHOUT PRESENCE OF ULCERS  *URINARY PROBLEMS  *BOWEL PROBLEMS  UNUSUAL RASH Items with * indicate a potential emergency and should be followed up as soon as possible.  Feel free to call the clinic you have any questions or concerns. The clinic phone number is [phone].  Please show the CHEMO ALERT CARD at check-in to the Emergency Department and triage nurse.   Doxorubicin Liposomal injection What is this medicine? LIPOSOMAL DOXORUBICIN (LIP oh som al dox oh ROO bi sin) is a chemotherapy drug. This medicine is used to treat many kinds of cancer like Kaposi's sarcoma, multiple myeloma, and ovarian cancer. This medicine may be used for other purposes; ask your health care provider or pharmacist if you have questions. What should I tell my health care provider before I take this medicine? They need to know if you have any of these conditions: -blood disorders -heart disease -infection (especially a virus infection such as chickenpox, cold sores, or herpes) -liver disease -recent or ongoing radiation therapy -an unusual or allergic reaction to doxorubicin, other chemotherapy agents, soybeans, other medicines, foods, dyes, or preservatives -pregnant or trying to get pregnant -breast-feeding How should I use this  medicine? This drug is given as an infusion into a vein. It is administered in a hospital or clinic by a specially trained health care professional. If you have pain, swelling, burning or any unusual feeling around the site of your injection, tell your health care professional right away. Talk to your pediatrician regarding the use of this medicine in children. Special care may be needed. Overdosage: If you think you have taken too much of this medicine contact a poison control center or emergency room at once. NOTE: This medicine is only for you. Do not share this medicine with others. What if I miss a dose? It is important not to miss your dose. Call your doctor or health care professional if you are unable to keep an appointment. What may interact with this medicine? Do not take this medicine with any of the following medications: -zidovudine This medicine may also interact with the following medications: -medicines to increase blood counts like filgrastim, pegfilgrastim, sargramostim -vaccines Talk to your doctor or health care professional before taking any of these medicines: -acetaminophen -aspirin -ibuprofen -ketoprofen -naproxen This list may not describe all possible interactions. Give your health care provider a list of all the medicines, herbs, non-prescription drugs, or dietary supplements you use. Also tell them if you smoke, drink alcohol, or use illegal drugs. Some items may interact with your medicine. What should I watch for while using this medicine? Your condition will be monitored carefully while you are receiving this medicine. You will need important blood work done while you are taking this medicine. This drug may make you feel generally unwell. This is not uncommon, as chemotherapy can affect healthy cells as well as   cancer cells. Report any side effects. Continue your course of treatment even though you feel ill unless your doctor tells you to stop. Your urine may  turn orange-red for a few days after your dose. This is not blood. If your urine is dark or brown, call your doctor. In some cases, you may be given additional medicines to help with side effects. Follow all directions for their use. Call your doctor or health care professional for advice if you get a fever (100.5 degrees F or higher), chills or sore throat, or other symptoms of a cold or flu. Do not treat yourself. This drug decreases your body's ability to fight infections. Try to avoid being around people who are sick. This medicine may increase your risk to bruise or bleed. Call your doctor or health care professional if you notice any unusual bleeding. Be careful brushing and flossing your teeth or using a toothpick because you may get an infection or bleed more easily. If you have any dental work done, tell your dentist you are receiving this medicine. Avoid taking products that contain aspirin, acetaminophen, ibuprofen, naproxen, or ketoprofen unless instructed by your doctor. These medicines may hide a fever. Men and women of childbearing age should use effective birth control methods while using taking this medicine. Do not become pregnant while taking this medicine. There is a potential for serious side effects to an unborn child. Talk to your health care professional or pharmacist for more information. Do not breast-feed an infant while taking this medicine. Talk to your doctor about your risk of cancer. You may be more at risk for certain types of cancers if you take this medicine. What side effects may I notice from receiving this medicine? Side effects that you should report to your doctor or health care professional as soon as possible: -allergic reactions like skin rash, itching or hives, swelling of the face, lips, or tongue -low blood counts - this medicine may decrease the number of white blood cells, red blood cells and platelets. You may be at increased risk for infections and  bleeding. -signs of hand-foot syndrome - tingling or burning, redness, flaking, swelling, small blisters, or small sores on the palms of your hands or the soles of your feet -signs of infection - fever or chills, cough, sore throat, pain or difficulty passing urine -signs of decreased platelets or bleeding - bruising, pinpoint red spots on the skin, black, tarry stools, blood in the urine -signs of decreased red blood cells - unusually weak or tired, fainting spells, lightheadedness -back pain, chills, facial flushing, fever, headache, tightness in the chest or throat during the infusion -breathing problems -chest pain -fast, irregular heartbeat -mouth pain, redness, sores -pain, swelling, redness at site where injected -pain, tingling, numbness in the hands or feet -swelling of ankles, feet, or hands -vomiting Side effects that usually do not require medical attention (report to your doctor or health care professional if they continue or are bothersome): -diarrhea -hair loss -loss of appetite -nail discoloration or damage -nausea -red or watery eyes -red colored urine -stomach upset This list may not describe all possible side effects. Call your doctor for medical advice about side effects. You may report side effects to FDA at 1-800-FDA-1088. Where should I keep my medicine? This drug is given in a hospital or clinic and will not be stored at home. NOTE: This sheet is a summary. It may not cover all possible information. If you have questions about this medicine, talk to your   doctor, pharmacist, or health care provider.    2016, [employer]. (2012-02-10 10:12:56)  

## 2015-10-30 NOTE — Progress Notes (Signed)
NO FIRST TIME FOLLOW CALL PLACED -pt sees Dr Marko Plume on 5/30 which is the date the call would take place (Tues after the holiday).

## 2015-10-30 NOTE — Telephone Encounter (Signed)
appt made and avs printed °

## 2015-10-30 NOTE — Telephone Encounter (Signed)
-----   Message from Gordy Levan, MD sent at 10/30/2015 11:31 AM EDT ----- Scripts to pharmacy:  Refill zofran x 2 Refill ativan x1  Tell pharmacist she needs to pick up either flonase or nasocort, generics fine, one spray each nares daily  she may need to pick up Eucerin cream and coconut oil   thanks

## 2015-10-30 NOTE — Telephone Encounter (Signed)
S/w pt per Dr Livesays attached message. 

## 2015-10-30 NOTE — Progress Notes (Signed)
OFFICE PROGRESS NOTE   Oct 30, 2015   Physicians: [name]/ [name]/ John Clarene Essex, Jeneen Rinks [place]ard, [name] [facility] sarcoma clinic),[name] (PCP [facility]), [name]; [name], [name]   [facility] CT AP report from 10-22-15 and gyn oncology note 10-26-15 reviewed in [place]. PRN follow up now with [name] and none scheduled with [facility] IR   INTERVAL HISTORY:  Patient is seen, alone for visit, earlier than scheduled visit due to progressive metastatic endometrial stromal sarcoma. 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 Dr Prudy Feeler note mentions Hospice vs chemo, with comment that patient preferred treatment. Patient contacted this office after Dr Prudy Feeler visit, requesting that we proceed with chemotherapy.  She had teaching by RN for doxil by phone prior to visit today, with review and written information given now. She has given verbal consent for the doxil.   Patient was transfused 2 units PRBCs on 10-02-15 for hemoglobin 7.5, tolerated without difficulty and improved symptoms of anemia then. She has been taking oral iron twice daily. 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 She has no other bleeding. She had diarrhea x 1 this week, had omitted cholestyramine which she will now continue. She has had no fever, still some clear rhinorrhea despite 3 days claritin, some NP cough which she feels is secondary to the post nasal drainage. She denies chest pain or increased SOB. [facility] CT mentions some atelectasis in lung bases only. She has early satiety, tolerates poptarts and cereal, and  likes Boost Breeze; have encouraged her to drink at least 3 daily. She met with [facility] dietician recently also. She has not needed pain medication in at least 2 weeks, with low abdominal discomfort more "mild cramping". She does not have LE swelling. She is walking mostly without rolling walker now.  Remainder of 10 point Review of Systems negative/ unchanged.   PAC in   [facility] Patient had heavy postmenopausal bleeding Sept 2015, then lesser bleeding over next several months until she was seen in 06-2014 by [name]. Hemoglobin was 12.7 on 07-04-14. CT CAP 06-25-14 had uterus 16.4 x 13.5 x 14.4 cm with marked expansion of endometrial canal by heterogeneous mass, bilateral pelvic sidewall adenopathy, iliac adenopathy with no definite retroperitoneal or mesenteric adenopathy, no ascites, no liver mets. Attempted endometrial sampling 06-26-14 and 07-03-14 was nondiagnostic; around that time the patient was passing clots and using one large maxipad hourly. She was seen by [name] 07-05-14, with uterine fundus palpable above umbilicus. She had surgery by [name] at [facility] on 07-14-14, which was exploratory laparotomy with TAH BSO, bilateral total pelvic lymphadenectomy and sampling of aortic and renal nodes. Pathology [facility] [phone]) found high grade endometrial stromal sarcoma with primary 18 cm, 8/45 nodes involved including 2 right pelvic and 6 left pelvic nodes, ER PR negative. Case was presented at [facility] multidisciplinary conference 07-23-14, with recommendation for PET CT to evaluate inguinal and portahepatis nodes, and to consider adjuvant gemzar taxotere and possibly follow with 4 cycles of adriamycin, then to consider whole pelvic RT. She saw [name] for post op follow up on  07-28-14, with recommendation for gemzar taxotere and adriamycin, whole pelvic RT after chemo and consideration of Megace maintenance after chemo (possibly prior to negative ER PR information). PET 08-01-14 had some uptake  in aortocaval and left paraaortic regions. She had day 1 cycle 1 gemzar taxotere on 08-28-14, day 8 cycle 1 on 09-04-14 and neulasta on 09-06-14. Admitted with neutropenic fever on day 14 cycle 1, with oral mucositis and some diarrhea. Counts maintained cycle 2 using OnPro neulasta day 9. She had progressive LE swelling after cycle 3, with venous dopplers negative 10-23-14, then LE swelling and SOB so marked after day 1 cycle 4 10-30-14 that chemo was held. Restaging CT AP + CXR 11-10-14 had stable tiny left pulmonary nodule/ no pleural effusion and normal heart size; CT had necrotic aortocaval adenopathy, iliac adenopathy L>R and 7x10 cm fluid collection in pelvis. She received IMRT 45 gray in 25 fractions to pelvis from 6-27 thru 01-05-15, with resolution of LE swelling by completion of course. CT CAP 02-03-15 showed improvement in retroperitoneal and pelvic involvement. She went onto observation after RT, plan to wait until clear progressive disease before resuming treatment, possibly adriamycin vs on study. CT CAP 05-07-15 showed new lytic lesion at T4, stable 2-3 mm pulmonary nodules, increased left pelvic and retroperitoneal nodes and RLQ peritoneal nodule. Radiation therapy summary as follows: Indication for treatment: A new bone lesion in the right side of the T4 vertebral body, with soft tissue extension slightly impinging upon the central spinal canal, some pain from this lesion Radiation treatment dates: 05/18/2015 through 06/05/2015 Site/dose: T4 thoracic spine area, 35 gray in 14 fractions She was not eligible for any of the arms of MATCH trial by evaluation in Jan 2017. Plan was to begin treatment with adriamycin + olaratumab, however this not begun prior to hospitalization 07-30-15 with abdominal/ pelvic abscess. She was hospitalized at [facility] from 2-23 thru 08-27-15 with bowel fistulas and abdominopelvic abscesses. She was transferred to [facility] on 08-27-15 and remained hospitalized there thru ~ 09-28-15,  with 2 surgeries for bowel involvement with malignancy. IR drains fell out in 10-2015, with CT AP [facility] 10-22-15 showing progression of extensive intraabdominal disease.     Objective:  Vital signs in last 24 hours:  BP 130/65 mmHg  Pulse 96  Temp(Src) 99.3 F (37.4 C) (Oral)  Resp 19  Wt 191 lb (86.637 kg)  SpO2 100%  LMP 02/04/2014 Weight down 5 lbs. Alert, oriented and appropriate. Occasional coarse, NP cough, respirations not labored. Somewhat pale.  Ambulatory slowly without assistance. Not in acute distress.  No alopecia  HEENT:PERRL, sclerae not icteric. Oral mucosa moist without lesions, posterior pharynx clear.Nares without purulent drainage.  Neck supple. No JVD.  Lymphatics:no cervical,supraclavicular adenopathy Resp: clear to auscultation bilaterally, no dullness to percussion Cardio: regular rate and rhythm. No gallop. GI: abdomen obese, soft, nontender, moderately distended not tight, full especially lower abdomen bilaterally.  Occasional bowel sounds. Mucous fistula bag with a few cc's of bright red blood, bag changed this AM. Musculoskeletal/ Extremities: without pitting edema, cords, tenderness Neuro: speech fluent, moves all extremities, no focal deficits Skin somewhat pale including mucous membranes, without rash, ecchymosis, petechiae Portacath-without erythema or tenderness  Lab Results:  Results for orders placed or performed in visit on 10/30/15  TECHNOLOGIST REVIEW  Result Value Ref Range   Technologist Review Sl toxic granulation, dohle bodies, Mod vacuoles   CBC with Differential  Result Value Ref Range   WBC 28.4 (H) 3.9 - 10.3 10e3/uL  NEUT# 24.1 (H) 1.5 - 6.5 10e3/uL   HGB 8.5 (L) 11.6 - 15.9 g/dL   HCT 26.4 (L) 34.8 - 46.6 %   Platelets 435 (H) 145 - 400 10e3/uL   MCV 86.3 79.5 - 101.0 fL   MCH 27.8 25.1 - 34.0 pg   MCHC 32.2 31.5 - 36.0 g/dL   RBC 3.06 (L) 3.70 - 5.45 10e6/uL   RDW 15.6 (H) 11.2 - 14.5 %   lymph# 0.5 (L) 0.9 - 3.3 10e3/uL     MONO# 1.7 (H) 0.1 - 0.9 10e3/uL   Eosinophils Absolute 2.1 (H) 0.0 - 0.5 10e3/uL   Basophils Absolute 0.0 0.0 - 0.1 10e3/uL   NEUT% 84.6 (H) 38.4 - 76.8 %   LYMPH% 1.9 (L) 14.0 - 49.7 %   MONO% 5.8 0.0 - 14.0 %   EOS% 7.5 (H) 0.0 - 7.0 %   BASO% 0.2 0.0 - 2.0 %  Comprehensive metabolic panel  Result Value Ref Range   Sodium 135 (L) 136 - 145 mEq/L   Potassium 3.3 (L) 3.5 - 5.1 mEq/L   Chloride 104 98 - 109 mEq/L   CO2 19 (L) 22 - 29 mEq/L   Glucose 107 70 - 140 mg/dl   BUN 11.9 7.0 - 26.0 mg/dL   Creatinine 1.4 (H) 0.6 - 1.1 mg/dL   Total Bilirubin 1.06 0.20 - 1.20 mg/dL   Alkaline Phosphatase 155 (H) 40 - 150 U/L   AST 15 5 - 34 U/L   ALT 16 0 - 55 U/L   Total Protein 7.1 6.4 - 8.3 g/dL   Albumin 2.8 (L) 3.5 - 5.0 g/dL   Calcium 8.9 8.4 - 10.4 mg/dL   Anion Gap 11 3 - 11 mEq/L   EGFR 42 (L) >90 ml/min/1.73 m2     Studies/Results:  CT Abdomen Pelvis W IV and Oral Contrast5/18/2017  [facility]  Result Narrative  EXAM: CT abdomen and pelvis with contrast DATE: 10/22/2015 9:28 PM ACCESSION: [id number] UN DICTATED: 10/22/2015 11:14 PM INTERPRETATION LOCATION: [place]: 60 years old Female with ABDOMINAL PAIN, RIGHT LOWER QUADRANT--    COMPARISON: CTA abdomen and pelvis 09/25/2015  TECHNIQUE: A spiral CT scan was obtained with IV contrast from the lung bases to the pubic symphysis.  Images were reconstructed in the axial plane. Coronal and sagittal reformatted images were also provided for further evaluation.  FINDINGS:  Mild atelectasis at the lung bases. Mitral annular calcifications.  Hepatomegaly with likely focal fat along the fissure for the ligamentum teres. Curvilinear hypoattenuating focus in the lateral aspect of the left hepatic lobe (3:19), not clearly seen on prior contrast-enhanced CT of 08/22/2015; query perfusional abnormality, small branch vessel thrombosis, or focal biliary dilation. Additional scattered foci of hypoattenuation  in the liver too small to characterize.  Splenomegaly, measures 13.9 cm.  Pancreas and adrenals are unremarkable.  Malrotated right kidney with mild hydronephrosis, slightly increased since prior. Moderate left hydronephrosis, increased since prior. Delayed left nephrogram.  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 Right lower quadrant ostomy with fat-containing parastomal hernia into which colon protrudes. Interval removal of right lower quadrant percutaneous drain. Heterogeneously enhancing, multilobulated mass in the right lower quadrant abutting the cecum measures approximately 9.6 x 15.7 x 8.3 cm, previously 7.7 x 10.5 x 8.2 cm (3:69). The mass appears to extend into the tract left by the previous right lower quadrant percutaneous drain (3:70) and obliterates the fat plane with the right iliopsoas  muscle (3:71). Contrast opacifies the rectum, ascending, and transverse colon up to the resection site without extravasation. Additional retroperitoneal mass or conglomerate adenopathy which may involve small bowel in the midabdomen measures up to 4.8 cm AP (3:52), previously 4.3 cm. This lesion abuts and has mass effect on the anterior aspect of the aorta just above the iliac bifurcation. A third mass abutting small bowel in the left hemiabdomen measuring up to 4.4 cm (5:47) may be present, versus fluid-filled bowel; it is difficult to evaluate due to lack of oral contrast at this level. No evidence of bowel obstruction.      Multiple additional abdominal/pelvic masses/implants including:  left pelvic sidewall, 1.6 cm (3:72), previously 1.2 cm; left pelvic sidewall, 3.3 cm (3:75), previously 3.2 cm; right pelvic sidewall (2.5 cm, 3:80), previously 2.5 cm; overall appearance is stable to slightly increased since the previous study. Subcentimeter implant in the left anterior pelvis (3:68), unchanged.   There is mild thickening of the  superior bladder wall which close abuts abdominal masses (5:36). Uterus is absent. Ovaries not clearly delineated.  Multiple additional soft tissue implants identified: -Multilobulated lesion abutting the left psoas and encasing the left common and external iliac arteries measuring 5.1 x 2.4 x 8.4 cm, previously measuring 5.2 x 2.4 x 7.5 cm (3:60). -Right pelvic sidewall lesion measuring 2.5 x 2.4 cm, previously 2.4 x 2.2 cm (3:80). -Left pelvic sidewall/UVJ lesion measuring 3.3 x 2.6 cm, previously 3.2 x 2.4 cm (3:75).  Likely mass effect on iliac veins bilaterally, right greater than left.  Fat stranding within the mesentery.  Midline fat stranding; query small volume soft tissue at the midline in the subcutaneous tissues as on prior.  No worrisome osseous lesions.   IMPRESSION:  Increased size of right lower quadrant mass involving bowel with likely extension into the percutaneous drain tract.  Stable to slightly increased size of multiple additional intra-abdominal disease implants with interval development of mild right hydronephrosis and moderate left hydronephrosis with mildly delayed left nephrogram.  Mild superior bladder wall thickening adjacent to the right lower quadrant mass. May represent disease involvement and/or.  Fat stranding in the mesentery, similar to prior.   Curvilinear hypoattenuating focus in the peripheral left hepatic lobe; query perfusional abnormality, small branch vessel thrombosis, or focal biliary dilation; attention on follow up.     Medications: I have reviewed the patient's current medications. WIll give po potassium today. Continue bid oral iron. Zofran and ativan prescriptions.  Does not need additional diflucan.  WIll try flonase or nasocort.  DISCUSSION  Interval history reviewed, including information from [facility] scans and Dr Calton Dach note. Obviously she is at high risk for acute or more gradual worsening, however she does appear clinically  better overall today than in last 2-3 months, including better nutrition, improved activity and less pain. We have discussed possible side effects of doxil, which hopefully she will be able to tolerate more easily than other agents. She understands use of antiemetics. Carefully discussed skin care related to doxil, including oral ice during chemo and ice packs to palms and soles beginning at least cycles 2-3. I have told her that this treatment may not benefit, and certainly will not cure,  tho we can try if she tolerates. If she does not tolerate or this is not helpful, I have told her that Hospice would probably be best care; she seems to be in agreement.  She gives verbal consent for doxil treatment. She is aware that she may need additional transfusions with  the ongoing bleeding.  She prefers appointments on days other than Fridays, but understands that she was worked in for visit and first treatment today. She is not driving, which is appropriate. She prefers next appointment moved later next week than had been scheduled on 11-03-15.    Assessment/Plan:  1. Metastatic endometrial stromal sarcoma extensive in pelvis and involving T4 and tiny pulmonary nodules unclear significance (CT chest 07-2015), progressive on gemzar taxotere and post radiation to T4. No treatment available on MATCH trial thru [facility]. Plan had been for adriamycin + olaratumab, not begun due to interval bowel perforation(s) with abdominal and pelvic abscesses. Now post surgeries x 2 with entero- enterostomy and mucous fistula. She has improved from very complicated 2 month hospitalization, very focused on trying more systemic treatment, appears stable at least today for first doxil.  2.JP drain by IR at [facility] replaced 10-12-15 then inadvertently pulled out 10-15-15. Some symptoms of fullness and pressure in low abdomen/ pelvis. 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 Post 2 units PRBCs at this office 10-02-15. Oral iron. She likely needs transfusion for hgb <8 4. Upper and lower respiratory symptoms improved, may be some environmental allergies now. Lung bases imaged on [facility] CT 10-22-15 no mention of pulmonary nodules, that questioned on last chest CT in Feb.  5. protein calorie malnutrition: now oral intake only. [place] nutritionist has seen. Encouraged Boost Breeze 3x daily.  5. No fever. Completed 14 day course of levaquin and flagyl. Had E coli in pelvic abscesses in 07-2015, GPC in 1/2 blood cultures 08-24-15, MSSA in blood cx at [facility].  6.degenerative arthritis knees very symptomatic prior to all of recent complications 7.advance directives in place 8.social situation: lives alone, equidistant [place] and [place]. Caregiver arranged for several hours daily, HH nursing and PT involved for now 9.mild-moderate bilateral hydronephrosis by [place] CT, creatinine stable in past 4 weeks, follow 10.hx psoriasis and nonmelanoma skin ca. Hx morbid obesity and previous gastric banding 11. PAC  12.oral thrush: resolved with diflucan    All questions answered and patient is in agreement with recommendations and plans, knows to call if concerns prior to next scheduled visit.Marland Kitchen Doxil dose discussed with [facility] pharmacist; have dose reduced  due to overall status; orders completed. I will see her back in a week with labs. Time spent 45 min including >50% counseling and coordination of care.  Route PCP.    Gordy Levan, MD   10/30/2015, 12:32 PM

## 2015-11-02 ENCOUNTER — Other Ambulatory Visit: Payer: Self-pay | Admitting: Oncology

## 2015-11-02 DIAGNOSIS — C541 Malignant neoplasm of endometrium: Secondary | ICD-10-CM

## 2015-11-02 DIAGNOSIS — D500 Iron deficiency anemia secondary to blood loss (chronic): Secondary | ICD-10-CM

## 2015-11-03 ENCOUNTER — Ambulatory Visit: Payer: Federal, State, Local not specified - PPO | Admitting: Oncology

## 2015-11-03 ENCOUNTER — Other Ambulatory Visit: Payer: Federal, State, Local not specified - PPO

## 2015-11-05 ENCOUNTER — Encounter: Payer: Self-pay | Admitting: Oncology

## 2015-11-05 ENCOUNTER — Ambulatory Visit (HOSPITAL_BASED_OUTPATIENT_CLINIC_OR_DEPARTMENT_OTHER): Payer: Federal, State, Local not specified - PPO

## 2015-11-05 ENCOUNTER — Other Ambulatory Visit (HOSPITAL_BASED_OUTPATIENT_CLINIC_OR_DEPARTMENT_OTHER): Payer: Federal, State, Local not specified - PPO

## 2015-11-05 ENCOUNTER — Ambulatory Visit (HOSPITAL_BASED_OUTPATIENT_CLINIC_OR_DEPARTMENT_OTHER): Payer: Federal, State, Local not specified - PPO | Admitting: Oncology

## 2015-11-05 ENCOUNTER — Ambulatory Visit (HOSPITAL_COMMUNITY)
Admission: RE | Admit: 2015-11-05 | Discharge: 2015-11-05 | Disposition: A | Discharge status: Home / Self Care | Payer: Federal, State, Local not specified - PPO | Source: Ambulatory Visit | Attending: Oncology | Admitting: Oncology

## 2015-11-05 VITALS — BP 120/75 | HR 121 | Temp 99.8°F | Resp 20 | Ht 60.0 in | Wt 189.3 lb

## 2015-11-05 DIAGNOSIS — C541 Malignant neoplasm of endometrium: Secondary | ICD-10-CM | POA: Insufficient documentation

## 2015-11-05 DIAGNOSIS — E46 Unspecified protein-calorie malnutrition: Secondary | ICD-10-CM

## 2015-11-05 DIAGNOSIS — R5381 Other malaise: Secondary | ICD-10-CM

## 2015-11-05 DIAGNOSIS — D649 Anemia, unspecified: Secondary | ICD-10-CM | POA: Diagnosis not present

## 2015-11-05 DIAGNOSIS — C772 Secondary and unspecified malignant neoplasm of intra-abdominal lymph nodes: Secondary | ICD-10-CM

## 2015-11-05 DIAGNOSIS — E876 Hypokalemia: Secondary | ICD-10-CM

## 2015-11-05 DIAGNOSIS — C7989 Secondary malignant neoplasm of other specified sites: Secondary | ICD-10-CM | POA: Diagnosis not present

## 2015-11-05 DIAGNOSIS — E440 Moderate protein-calorie malnutrition: Secondary | ICD-10-CM

## 2015-11-05 DIAGNOSIS — Z452 Encounter for adjustment and management of vascular access device: Secondary | ICD-10-CM | POA: Diagnosis not present

## 2015-11-05 DIAGNOSIS — Z95828 Presence of other vascular implants and grafts: Secondary | ICD-10-CM

## 2015-11-05 DIAGNOSIS — R634 Abnormal weight loss: Secondary | ICD-10-CM

## 2015-11-05 DIAGNOSIS — N739 Female pelvic inflammatory disease, unspecified: Secondary | ICD-10-CM

## 2015-11-05 DIAGNOSIS — K922 Gastrointestinal hemorrhage, unspecified: Secondary | ICD-10-CM

## 2015-11-05 DIAGNOSIS — D500 Iron deficiency anemia secondary to blood loss (chronic): Secondary | ICD-10-CM

## 2015-11-05 LAB — COMPREHENSIVE METABOLIC PANEL
ALBUMIN: 2.6 g/dL — AB (ref 3.5–5.0)
ALK PHOS: 142 U/L (ref 40–150)
ALT: 9 U/L (ref 0–55)
ANION GAP: 8 meq/L (ref 3–11)
AST: 11 U/L (ref 5–34)
BILIRUBIN TOTAL: 1.23 mg/dL — AB (ref 0.20–1.20)
BUN: 11.4 mg/dL (ref 7.0–26.0)
CALCIUM: 8.8 mg/dL (ref 8.4–10.4)
CO2: 22 mEq/L (ref 22–29)
Chloride: 102 mEq/L (ref 98–109)
Creatinine: 1.3 mg/dL — ABNORMAL HIGH (ref 0.6–1.1)
EGFR: 45 mL/min/{1.73_m2} — AB (ref >90)
GLUCOSE: 131 mg/dL (ref 70–140)
POTASSIUM: 2.8 meq/L — AB (ref 3.5–5.1)
SODIUM: 132 meq/L — AB (ref 136–145)
TOTAL PROTEIN: 6.8 g/dL (ref 6.4–8.3)

## 2015-11-05 LAB — CBC WITH DIFFERENTIAL/PLATELET
BASO%: 0.2 % (ref 0.0–2.0)
Basophils Absolute: 0.1 10*3/uL (ref 0.0–0.1)
EOS%: 9 % — ABNORMAL HIGH (ref 0.0–7.0)
Eosinophils Absolute: 4.4 10*3/uL — ABNORMAL HIGH (ref 0.0–0.5)
HCT: 25.6 % — ABNORMAL LOW (ref 34.8–46.6)
HGB: 8.3 g/dL — ABNORMAL LOW (ref 11.6–15.9)
LYMPH%: 1.3 % — ABNORMAL LOW (ref 14.0–49.7)
MCH: 28 pg (ref 25.1–34.0)
MCHC: 32.4 g/dL (ref 31.5–36.0)
MCV: 86.4 fL (ref 79.5–101.0)
MONO#: 1.5 10*3/uL — ABNORMAL HIGH (ref 0.1–0.9)
MONO%: 3.1 % (ref 0.0–14.0)
NEUT#: 42.2 10*3/uL — ABNORMAL HIGH (ref 1.5–6.5)
NEUT%: 86.4 % — ABNORMAL HIGH (ref 38.4–76.8)
Platelets: 437 10*3/uL — ABNORMAL HIGH (ref 145–400)
RBC: 2.96 10*6/uL — ABNORMAL LOW (ref 3.70–5.45)
RDW: 15.3 % — ABNORMAL HIGH (ref 11.2–14.5)
WBC: 48.8 10*3/uL — ABNORMAL HIGH (ref 3.9–10.3)
lymph#: 0.6 10*3/uL — ABNORMAL LOW (ref 0.9–3.3)

## 2015-11-05 LAB — PREPARE RBC (CROSSMATCH)

## 2015-11-05 LAB — IRON AND TIBC
%SAT: 8 % — AB (ref 21–57)
IRON: 14 ug/dL — AB (ref 41–142)
TIBC: 173 ug/dL — AB (ref 236–444)
UIBC: 159 ug/dL (ref 120–384)

## 2015-11-05 MED ORDER — HYDROCOD POLST-CPM POLST ER 10-8 MG/5ML PO SUER: ORAL | Status: DC

## 2015-11-05 MED ORDER — SODIUM CHLORIDE 0.9 % IJ SOLN
10.0000 mL | INTRAMUSCULAR | Status: DC | PRN
  Administered 2015-11-05: 10 mL via INTRAVENOUS
  Filled 2015-11-05: qty 10

## 2015-11-05 MED ORDER — POTASSIUM CHLORIDE ER 10 MEQ PO TBCR: 10.0000 meq | EXTENDED_RELEASE_TABLET | Freq: Two times a day (BID) | ORAL | Status: DC

## 2015-11-05 MED ORDER — POTASSIUM CHLORIDE CRYS ER 20 MEQ PO TBCR
20.0000 meq | EXTENDED_RELEASE_TABLET | Freq: Two times a day (BID) | ORAL | Status: DC
  Administered 2015-11-05: 20 meq via ORAL
  Filled 2015-11-05: qty 1

## 2015-11-05 NOTE — Progress Notes (Signed)
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 She has had very complicated course in past several months, including 2 month hospitalization Feb - April at [place] [facility].  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 He talked with patient about Hospice vs further attempt at chemo in palliative attempt; patient much preferred to try more treatment. 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  Home health is still involved, PT 2x weekly and HH RN ( I believe). They check her BP and HR. Last 2 visits with PT had to stop early due to elevated heart rate and fatigue. She is not able to do PT exercises between those visits.   Patient tolerated doxil infusion with no difficulty; she has had no skin irritation and no nausea or other problems since that chemo. Appetite was better for next couple of days after steroids used with antiemetics for chemo, tho she had abdominal pain after eating more than usual. The abdominal pain began day 3-4 after doxil and lasted a couple of days before resolving. She did not have vomiting and bowels are moving with soft stool ~ 3x daily. SHe is drinking [employer]. 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 No other bleeding. She is taking oral iron 2x daily without  difficulty. She has felt more tired last couple of days, not SOB at rest, no chest pain. She has not fallen again at home. She has no increased swelling LE. She is voiding. She has not had fever. Cough from recent respiratory infection continues to improve; post nasal drainage and sinus congestion better. No problems with PAC Remainder of 10 point Review of Systems negative.    ONCOLOGIC HISTORY Patient had heavy postmenopausal bleeding Sept 2015, then lesser bleeding over next several months until she was seen in 06-2014 by [name]. Hemoglobin was 12.7 on 07-04-14. CT CAP 06-25-14 had uterus 16.4 x 13.5 x 14.4 cm with marked expansion of endometrial canal by heterogeneous mass, bilateral pelvic sidewall adenopathy, iliac adenopathy with no definite retroperitoneal or mesenteric adenopathy, no ascites, no liver mets. Attempted endometrial sampling 06-26-14 and 07-03-14 was nondiagnostic; around that time the patient was passing clots and using one large maxipad hourly. She was seen by [name] 07-05-14, with uterine fundus palpable above umbilicus. She had surgery by [name] at [facility] on 07-14-14, which was exploratory laparotomy with TAH BSO, bilateral total pelvic lymphadenectomy and sampling of aortic and renal nodes. Pathology [facility] [phone]) found high grade endometrial stromal sarcoma with primary 18 cm, 8/45 nodes involved including 2 right pelvic and 6 left pelvic nodes, ER PR negative. Case was presented at [facility] multidisciplinary conference 07-23-14, with recommendation for PET CT to evaluate inguinal and portahepatis nodes, and to consider adjuvant gemzar taxotere and possibly follow with 4 cycles of adriamycin, then to consider whole pelvic RT. She saw [name] for post op  follow up on 07-28-14, with recommendation for gemzar taxotere and adriamycin, whole pelvic RT after chemo and consideration of Megace maintenance after chemo (possibly prior to negative ER PR information). PET 08-01-14 had some  uptake in aortocaval and left paraaortic regions. She had day 1 cycle 1 gemzar taxotere on 08-28-14, day 8 cycle 1 on 09-04-14 and neulasta on 09-06-14. Admitted with neutropenic fever on day 14 cycle 1, with oral mucositis and some diarrhea. Counts maintained cycle 2 using OnPro neulasta day 9. She had progressive LE swelling after cycle 3, with venous dopplers negative 10-23-14, then LE swelling and SOB so marked after day 1 cycle 4 10-30-14 that chemo was held. Restaging CT AP + CXR 11-10-14 had stable tiny left pulmonary nodule/ no pleural effusion and normal heart size; CT had necrotic aortocaval adenopathy, iliac adenopathy L>R and 7x10 cm fluid collection in pelvis. She received IMRT 45 gray in 25 fractions to pelvis from 6-27 thru 01-05-15, with resolution of LE swelling by completion of course. CT CAP 02-03-15 showed improvement in retroperitoneal and pelvic involvement. She went onto observation after RT, plan to wait until clear progressive disease before resuming treatment, possibly adriamycin vs on study. CT CAP 05-07-15 showed new lytic lesion at T4, stable 2-3 mm pulmonary nodules, increased left pelvic and retroperitoneal nodes and RLQ peritoneal nodule. Radiation therapy summary as follows: Indication for treatment: A new bone lesion in the right side of the T4 vertebral body, with soft tissue extension slightly impinging upon the central spinal canal, some pain from this lesion Radiation treatment dates: 05/18/2015 through 06/05/2015 Site/dose: T4 thoracic spine area, 35 gray in 14 fractions She was not eligible for any of the arms of MATCH trial by evaluation in Jan 2017. Plan was to begin treatment with adriamycin + olaratumab, however this not begun prior to hospitalization 07-30-15 with abdominal/ pelvic abscess. She was hospitalized at [facility] from 2-23 thru 08-27-15 with bowel fistulas and abdominopelvic abscesses. She was transferred to [facility] on 08-27-15 and remained hospitalized there thru ~  09-28-15, with 2 surgeries for bowel involvement with malignancy. IR drains fell out in 10-2015, with CT AP [facility] 10-22-15 showing progression of extensive intraabdominal disease. 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 Patient requested trying further chemo, first doxil given 10-30-15.    Objective:  Vital signs in last 24 hours:  BP 120/75 mmHg  Pulse 121  Temp(Src) 99.8 F (37.7 C) (Oral)  Resp 20  Ht 5' (1.524 m)  Wt 189 lb 4.8 oz (85.866 kg)  BMI 36.97 kg/m2  SpO2 100%  LMP 02/04/2014  weight down 2 lbs Alert, oriented and appropriate. Pale, not icteric, looks fatigued and chronically ill, not as good in general as when I saw her on 10-30-15. . Ambulatory with effort across exam room to [facility]. Respirations not labored seated. Partial alopecia  HEENT:PERRL, sclerae not icteric. Oral mucosa moist without lesions, posterior pharynx clear. Mucous membranes pale. Neck supple. No JVD.  Lymphatics:no cervical,supraclavicular adenopathy Resp: clear to auscultation bilaterally and normal percussion bilaterally Cardio: tachy, regular rate and rhythm. No gallop. Raspy cough when difficulty swallowing oral K now GI: abdomen obese, soft, nontender, not obviously more distended, full thru mid and lower abdomen, cannot appreciate organomegaly. Some bowel sounds. Surgical incision not remarkable. Bag on mucous fistula with ~ 30 cc frank blood, this emptied earlier today.  Stoma pink, no pain in that area Musculoskeletal/ Extremities: without pitting edema, cords, tenderness Neuro: speech fluent, moves all extremities, no focal deficits.  Skin without rash, ecchymosis, petechiae Portacath-without erythema or tenderness  Lab Results:  Results for orders placed or performed in visit on 11/05/15  CBC with Differential  Result Value Ref Range    WBC 48.8 (H) 3.9 - 10.3 10e3/uL   NEUT# 42.2 (H) 1.5 - 6.5 10e3/uL   HGB 8.3 (L) 11.6 - 15.9 g/dL   HCT 25.6 (L) 34.8 - 46.6 %   Platelets 437 (H) 145 - 400 10e3/uL   MCV 86.4 79.5 - 101.0 fL   MCH 28.0 25.1 - 34.0 pg   MCHC 32.4 31.5 - 36.0 g/dL   RBC 2.96 (L) 3.70 - 5.45 10e6/uL   RDW 15.3 (H) 11.2 - 14.5 %   lymph# 0.6 (L) 0.9 - 3.3 10e3/uL   MONO# 1.5 (H) 0.1 - 0.9 10e3/uL   Eosinophils Absolute 4.4 (H) 0.0 - 0.5 10e3/uL   Basophils Absolute 0.1 0.0 - 0.1 10e3/uL   NEUT% 86.4 (H) 38.4 - 76.8 %   LYMPH% 1.3 (L) 14.0 - 49.7 %   MONO% 3.1 0.0 - 14.0 %   EOS% 9.0 (H) 0.0 - 7.0 %   BASO% 0.2 0.0 - 2.0 %  Comprehensive metabolic panel  Result Value Ref Range   Sodium 132 (L) 136 - 145 mEq/L   Potassium 2.8 (LL) 3.5 - 5.1 mEq/L   Chloride 102 98 - 109 mEq/L   CO2 22 22 - 29 mEq/L   Glucose 131 70 - 140 mg/dl   BUN 11.4 7.0 - 26.0 mg/dL   Creatinine 1.3 (H) 0.6 - 1.1 mg/dL   Total Bilirubin 1.23 (H) 0.20 - 1.20 mg/dL   Alkaline Phosphatase 142 40 - 150 U/L   AST 11 5 - 34 U/L   ALT 9 0 - 55 U/L   Total Protein 6.8 6.4 - 8.3 g/dL   Albumin 2.6 (L) 3.5 - 5.0 g/dL   Calcium 8.8 8.4 - 10.4 mg/dL   Anion Gap 8 3 - 11 mEq/L   EGFR 45 (L) >90 ml/min/1.73 m2  Iron and TIBC  Result Value Ref Range   Iron 14 (L) 41 - 142 ug/dL   TIBC 173 (L) 236 - 444 ug/dL   UIBC 159 120 - 384 ug/dL   %SAT 8 (L) 21 - 57 %   Iron studies available after visit.  Studies/Results:  No results found.  Medications: I have reviewed the patient's current medications. Continue iron bid, mentioned IV iron if iron studies return low (see above) Hold cozaar. She will let me know BPs as HH is checking these.    DISCUSSION Patient is pleased that she tolerated first doxil without problems. I have reminded her to use lotion liberally (which she has not done at all this first cycle). I have told her that counts generally maintain better with doxil than other chemo. She is symptomatic again with anemia from  ongoing bleeding at mucous fistula. She agrees to PRBC transfusion, which can be done at [facility] on  11-06-15; orders placed now. She will continue oral iron and we will talk with her about IV iron as iron studies resulted low after visit. I wonder if any benefit to GI looking into the mucous fistula, in case isolated bleeding area could be addressed; she does not have GI physician in [place]. It may be easiest to ask gyn onc at [facility] to  set up GI referral there, likely could be done in more timely fashion that outpatient GI in [place]. Patient in agreement with GI referral if able to get this arranged.  Patient asks "why could they not remove all of the cancer with surgery" and I have explained that involvement was simply too extensive.    Patient still seems to have significant denial about severity of these problems.    Assessment/Plan:  1. Metastatic endometrial stromal sarcoma extensive in pelvis and involving T4, also tiny pulmonary nodules unclear significance (CT chest 07-2015), progressive on gemzar taxotere and post radiation to T4. No treatment available on MATCH trial thru [facility]. Plan had been for adriamycin + olaratumab, not begun due to interval bowel perforation(s) with abdominal and pelvic abscesses. Now post surgeries x 2 with entero- enterostomy and mucous fistula. She initially  improved from very complicated 2 month hospitalization,and at her request was given doxil (dose reduced) on 10-30-15 in palliative attempt. Plan doxil q 4 weeks if stable and appropriate.  2.JP drain by IR at [facility] replaced 10-12-15 then inadvertently pulled out 10-15-15. Last imaging was CT AP at [facility] ED 10-22-15. No recommendation for replacement of drains at recent [facility] onc visit. She does not have scheduled visit back to [facility] as best I can tell. 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 Post 2 units PRBCs at this office 10-02-15. Oral iron. Patient agrees to transfusion again, set up for [facility]. 4. Upper and lower respiratory symptoms improved, may be some environmental allergies now. Lung bases imaged on [facility] CT 10-22-15 no mention of pulmonary nodules, that questioned on last chest CT in Feb.  5. protein calorie malnutrition: now oral intake only. [place] nutritionist has seen. Encouraged Boost Breeze 3x daily. Recent abdominal pain x ~ 2 days after increased solid food 5. No fever. Completed 14 day course of levaquin and flagyl prior to 10-30-15 Had E coli in pelvic abscesses in 07-2015, GPC in 1/2 blood cultures 08-24-15, MSSA in blood cx at [facility].  6.degenerative arthritis knees very symptomatic prior to all of recent complications 7.advance directives in place 8.social situation: lives alone, equidistant [place] and [place]. Caregiver arranged for several hours daily, HH nursing and PT involved for now. Still very deconditioned. 9.mild-moderate bilateral hydronephrosis by [facility] CT, creatinine stable in past 4 weeks, follow 10.hx psoriasis and nonmelanoma skin ca. Hx morbid obesity and previous gastric banding 11. PAC  12.oral thrush: resolved with diflucan 13. Hypokalemia: KCl 20 mEq now and she will start 10 mEq bid, follow with next labs. Likely from bowels moving several x daily. Note patient had difficulty swallowing 20 mEq tablet now  All questions answered and patient is in agreement with recommendations and plans. We will be in touch re IV iron. PRBC orders placed for 2 units on 11-06-15. GI referral to be coordinated if possible.  I will see her on 11-19-15. Time spent  40 min including >50% counseling and coordination of care. Route PCP, cc Dr Jena Gauss, MD   11/05/2015, 5:57 PM

## 2015-11-05 NOTE — Patient Instructions (Signed)

## 2015-11-06 ENCOUNTER — Encounter: Payer: Self-pay | Admitting: Nurse Practitioner

## 2015-11-06 ENCOUNTER — Telehealth: Payer: Self-pay

## 2015-11-06 ENCOUNTER — Encounter (HOSPITAL_COMMUNITY): Payer: Self-pay | Admitting: *Deleted

## 2015-11-06 ENCOUNTER — Telehealth: Payer: Self-pay | Admitting: *Deleted

## 2015-11-06 ENCOUNTER — Ambulatory Visit (HOSPITAL_BASED_OUTPATIENT_CLINIC_OR_DEPARTMENT_OTHER): Payer: Federal, State, Local not specified - PPO

## 2015-11-06 ENCOUNTER — Ambulatory Visit (HOSPITAL_BASED_OUTPATIENT_CLINIC_OR_DEPARTMENT_OTHER): Payer: Federal, State, Local not specified - PPO | Admitting: Nurse Practitioner

## 2015-11-06 ENCOUNTER — Emergency Department (HOSPITAL_COMMUNITY): Payer: Federal, State, Local not specified - PPO

## 2015-11-06 ENCOUNTER — Inpatient Hospital Stay (HOSPITAL_COMMUNITY)
Admission: EM | Admit: 2015-11-06 | Discharge: 2015-11-11 | DRG: 871 | Disposition: A | Discharge status: Home Health | Payer: Federal, State, Local not specified - PPO | Attending: Internal Medicine | Admitting: Internal Medicine

## 2015-11-06 ENCOUNTER — Ambulatory Visit (HOSPITAL_COMMUNITY)
Admission: RE | Admit: 2015-11-06 | Discharge: 2015-11-06 | Disposition: A | Discharge status: Home / Self Care | Payer: Federal, State, Local not specified - PPO | Source: Ambulatory Visit | Attending: Internal Medicine | Admitting: Internal Medicine

## 2015-11-06 ENCOUNTER — Other Ambulatory Visit: Payer: Self-pay | Admitting: *Deleted

## 2015-11-06 ENCOUNTER — Other Ambulatory Visit: Payer: Self-pay

## 2015-11-06 VITALS — BP 108/70 | HR 109 | Temp 100.7°F | Resp 20

## 2015-11-06 VITALS — BP 93/73 | HR 111 | Temp 99.6°F | Resp 18 | Ht 60.0 in | Wt 189.0 lb

## 2015-11-06 DIAGNOSIS — Z791 Long term (current) use of non-steroidal anti-inflammatories (NSAID): Secondary | ICD-10-CM

## 2015-11-06 DIAGNOSIS — Z85828 Personal history of other malignant neoplasm of skin: Secondary | ICD-10-CM | POA: Diagnosis not present

## 2015-11-06 DIAGNOSIS — Z66 Do not resuscitate: Secondary | ICD-10-CM | POA: Diagnosis present

## 2015-11-06 DIAGNOSIS — D6481 Anemia due to antineoplastic chemotherapy: Secondary | ICD-10-CM | POA: Diagnosis present

## 2015-11-06 DIAGNOSIS — C541 Malignant neoplasm of endometrium: Secondary | ICD-10-CM | POA: Diagnosis present

## 2015-11-06 DIAGNOSIS — E876 Hypokalemia: Secondary | ICD-10-CM | POA: Insufficient documentation

## 2015-11-06 DIAGNOSIS — K529 Noninfective gastroenteritis and colitis, unspecified: Secondary | ICD-10-CM | POA: Diagnosis present

## 2015-11-06 DIAGNOSIS — Z807 Family history of other malignant neoplasms of lymphoid, hematopoietic and related tissues: Secondary | ICD-10-CM

## 2015-11-06 DIAGNOSIS — B370 Candidal stomatitis: Secondary | ICD-10-CM | POA: Diagnosis present

## 2015-11-06 DIAGNOSIS — E440 Moderate protein-calorie malnutrition: Secondary | ICD-10-CM | POA: Diagnosis present

## 2015-11-06 DIAGNOSIS — Z79899 Other long term (current) drug therapy: Secondary | ICD-10-CM

## 2015-11-06 DIAGNOSIS — C7951 Secondary malignant neoplasm of bone: Secondary | ICD-10-CM | POA: Diagnosis present

## 2015-11-06 DIAGNOSIS — L409 Psoriasis, unspecified: Secondary | ICD-10-CM | POA: Diagnosis present

## 2015-11-06 DIAGNOSIS — E46 Unspecified protein-calorie malnutrition: Secondary | ICD-10-CM

## 2015-11-06 DIAGNOSIS — Z833 Family history of diabetes mellitus: Secondary | ICD-10-CM | POA: Diagnosis not present

## 2015-11-06 DIAGNOSIS — D509 Iron deficiency anemia, unspecified: Secondary | ICD-10-CM | POA: Diagnosis not present

## 2015-11-06 DIAGNOSIS — R509 Fever, unspecified: Secondary | ICD-10-CM | POA: Diagnosis present

## 2015-11-06 DIAGNOSIS — E860 Dehydration: Secondary | ICD-10-CM | POA: Diagnosis present

## 2015-11-06 DIAGNOSIS — N390 Urinary tract infection, site not specified: Secondary | ICD-10-CM | POA: Diagnosis present

## 2015-11-06 DIAGNOSIS — R770 Abnormality of albumin: Secondary | ICD-10-CM

## 2015-11-06 DIAGNOSIS — R059 Cough, unspecified: Secondary | ICD-10-CM

## 2015-11-06 DIAGNOSIS — R1030 Lower abdominal pain, unspecified: Secondary | ICD-10-CM

## 2015-11-06 DIAGNOSIS — N1330 Unspecified hydronephrosis: Secondary | ICD-10-CM | POA: Diagnosis present

## 2015-11-06 DIAGNOSIS — T451X5A Adverse effect of antineoplastic and immunosuppressive drugs, initial encounter: Secondary | ICD-10-CM | POA: Diagnosis present

## 2015-11-06 DIAGNOSIS — R197 Diarrhea, unspecified: Secondary | ICD-10-CM

## 2015-11-06 DIAGNOSIS — D500 Iron deficiency anemia secondary to blood loss (chronic): Secondary | ICD-10-CM

## 2015-11-06 DIAGNOSIS — R32 Unspecified urinary incontinence: Secondary | ICD-10-CM | POA: Diagnosis present

## 2015-11-06 DIAGNOSIS — A419 Sepsis, unspecified organism: Secondary | ICD-10-CM | POA: Diagnosis not present

## 2015-11-06 DIAGNOSIS — C7800 Secondary malignant neoplasm of unspecified lung: Secondary | ICD-10-CM | POA: Diagnosis present

## 2015-11-06 DIAGNOSIS — A4151 Sepsis due to Escherichia coli [E. coli]: Secondary | ICD-10-CM | POA: Diagnosis present

## 2015-11-06 DIAGNOSIS — C7989 Secondary malignant neoplasm of other specified sites: Secondary | ICD-10-CM

## 2015-11-06 DIAGNOSIS — K922 Gastrointestinal hemorrhage, unspecified: Secondary | ICD-10-CM

## 2015-11-06 DIAGNOSIS — E43 Unspecified severe protein-calorie malnutrition: Secondary | ICD-10-CM | POA: Diagnosis present

## 2015-11-06 DIAGNOSIS — M170 Bilateral primary osteoarthritis of knee: Secondary | ICD-10-CM | POA: Diagnosis present

## 2015-11-06 DIAGNOSIS — R05 Cough: Secondary | ICD-10-CM

## 2015-11-06 DIAGNOSIS — I959 Hypotension, unspecified: Secondary | ICD-10-CM | POA: Diagnosis present

## 2015-11-06 DIAGNOSIS — Z8249 Family history of ischemic heart disease and other diseases of the circulatory system: Secondary | ICD-10-CM | POA: Diagnosis not present

## 2015-11-06 DIAGNOSIS — E8809 Other disorders of plasma-protein metabolism, not elsewhere classified: Secondary | ICD-10-CM | POA: Insufficient documentation

## 2015-11-06 DIAGNOSIS — N179 Acute kidney failure, unspecified: Secondary | ICD-10-CM

## 2015-11-06 DIAGNOSIS — K632 Fistula of intestine: Secondary | ICD-10-CM | POA: Diagnosis present

## 2015-11-06 DIAGNOSIS — N189 Chronic kidney disease, unspecified: Secondary | ICD-10-CM

## 2015-11-06 DIAGNOSIS — A498 Other bacterial infections of unspecified site: Secondary | ICD-10-CM | POA: Diagnosis not present

## 2015-11-06 DIAGNOSIS — N289 Disorder of kidney and ureter, unspecified: Secondary | ICD-10-CM | POA: Insufficient documentation

## 2015-11-06 DIAGNOSIS — Z9884 Bariatric surgery status: Secondary | ICD-10-CM | POA: Diagnosis not present

## 2015-11-06 DIAGNOSIS — G893 Neoplasm related pain (acute) (chronic): Secondary | ICD-10-CM

## 2015-11-06 DIAGNOSIS — Z933 Colostomy status: Secondary | ICD-10-CM

## 2015-11-06 LAB — GASTROINTESTINAL PANEL BY PCR, STOOL (REPLACES STOOL CULTURE)
ASTROVIRUS: NOT DETECTED
Adenovirus F40/41: NOT DETECTED
Campylobacter species: NOT DETECTED
Cryptosporidium: NOT DETECTED
Cyclospora cayetanensis: NOT DETECTED
E. coli O157: NOT DETECTED
ENTAMOEBA HISTOLYTICA: NOT DETECTED
ENTEROAGGREGATIVE E COLI (EAEC): NOT DETECTED
ENTEROTOXIGENIC E COLI (ETEC): NOT DETECTED
Enteropathogenic E coli (EPEC): NOT DETECTED
GIARDIA LAMBLIA: NOT DETECTED
NOROVIRUS GI/GII: NOT DETECTED
Plesimonas shigelloides: NOT DETECTED
Rotavirus A: NOT DETECTED
SALMONELLA SPECIES: NOT DETECTED
SHIGA LIKE TOXIN PRODUCING E COLI (STEC): NOT DETECTED
Sapovirus (I, II, IV, and V): NOT DETECTED
Shigella/Enteroinvasive E coli (EIEC): NOT DETECTED
VIBRIO CHOLERAE: NOT DETECTED
Vibrio species: NOT DETECTED
Yersinia enterocolitica: NOT DETECTED

## 2015-11-06 LAB — URINALYSIS, ROUTINE W REFLEX MICROSCOPIC
BILIRUBIN URINE: NEGATIVE
Glucose, UA: NEGATIVE mg/dL
Hgb urine dipstick: NEGATIVE
KETONES UR: NEGATIVE mg/dL
NITRITE: POSITIVE — AB
PH: 6 (ref 5.0–8.0)
Protein, ur: 100 mg/dL — AB
SPECIFIC GRAVITY, URINE: 1.019 (ref 1.005–1.030)

## 2015-11-06 LAB — CBC WITH DIFFERENTIAL/PLATELET
BASO%: 0.1 % (ref 0.0–2.0)
BASOS ABS: 0.1 10*3/uL (ref 0.0–0.1)
EOS ABS: 3.9 10*3/uL — AB (ref 0.0–0.5)
EOS%: 9.6 % — AB (ref 0.0–7.0)
HEMATOCRIT: 23.8 % — AB (ref 34.8–46.6)
HEMOGLOBIN: 8 g/dL — AB (ref 11.6–15.9)
LYMPH#: 0.5 10*3/uL — AB (ref 0.9–3.3)
LYMPH%: 1.1 % — ABNORMAL LOW (ref 14.0–49.7)
MCH: 28.7 pg (ref 25.1–34.0)
MCHC: 33.6 g/dL (ref 31.5–36.0)
MCV: 85.3 fL (ref 79.5–101.0)
MONO#: 1.9 10*3/uL — AB (ref 0.1–0.9)
MONO%: 4.7 % (ref 0.0–14.0)
NEUT#: 34.3 10*3/uL — ABNORMAL HIGH (ref 1.5–6.5)
NEUT%: 84.5 % — AB (ref 38.4–76.8)
PLATELETS: 304 10*3/uL (ref 145–400)
RBC: 2.79 10*6/uL — ABNORMAL LOW (ref 3.70–5.45)
RDW: 15.4 % — AB (ref 11.2–14.5)
WBC: 40.5 10*3/uL — ABNORMAL HIGH (ref 3.9–10.3)

## 2015-11-06 LAB — URINE MICROSCOPIC-ADD ON

## 2015-11-06 LAB — TECHNOLOGIST REVIEW

## 2015-11-06 LAB — COMPREHENSIVE METABOLIC PANEL
ALT: 9 U/L (ref 0–55)
ANION GAP: 12 meq/L — AB (ref 3–11)
AST: 9 U/L (ref 5–34)
Albumin: 2.5 g/dL — ABNORMAL LOW (ref 3.5–5.0)
Alkaline Phosphatase: 126 U/L (ref 40–150)
BILIRUBIN TOTAL: 1.29 mg/dL — AB (ref 0.20–1.20)
BUN: 11.4 mg/dL (ref 7.0–26.0)
CHLORIDE: 101 meq/L (ref 98–109)
CO2: 19 meq/L — AB (ref 22–29)
Calcium: 8.7 mg/dL (ref 8.4–10.4)
Creatinine: 1.2 mg/dL — ABNORMAL HIGH (ref 0.6–1.1)
EGFR: 49 mL/min/{1.73_m2} — AB (ref >90)
GLUCOSE: 105 mg/dL (ref 70–140)
Potassium: 3.1 mEq/L — ABNORMAL LOW (ref 3.5–5.1)
SODIUM: 132 meq/L — AB (ref 136–145)
TOTAL PROTEIN: 6.6 g/dL (ref 6.4–8.3)

## 2015-11-06 LAB — C DIFFICILE QUICK SCREEN W PCR REFLEX
C DIFFICILE (CDIFF) INTERP: NEGATIVE
C DIFFICILE (CDIFF) TOXIN: NEGATIVE
C DIFFICLE (CDIFF) ANTIGEN: NEGATIVE

## 2015-11-06 LAB — I-STAT CG4 LACTIC ACID, ED: Lactic Acid, Venous: 1.01 mmol/L (ref 0.5–2.0)

## 2015-11-06 LAB — PREPARE RBC (CROSSMATCH)

## 2015-11-06 MED ORDER — PANTOPRAZOLE SODIUM 40 MG PO TBEC
40.0000 mg | DELAYED_RELEASE_TABLET | Freq: Every day | ORAL | Status: DC
  Administered 2015-11-06 – 2015-11-11 (×6): 40 mg via ORAL
  Filled 2015-11-06 (×6): qty 1

## 2015-11-06 MED ORDER — SODIUM CHLORIDE 0.9% FLUSH: 3.0000 mL | INTRAVENOUS | Status: DC | PRN

## 2015-11-06 MED ORDER — ACETAMINOPHEN 325 MG PO TABS: 325.0000 mg | ORAL_TABLET | Freq: Once | ORAL | Status: DC

## 2015-11-06 MED ORDER — SODIUM CHLORIDE 0.9 % IV SOLN: 250.0000 mL | Freq: Once | INTRAVENOUS | Status: DC

## 2015-11-06 MED ORDER — CHOLESTYRAMINE 4 G PO PACK
1.0000 | PACK | Freq: Two times a day (BID) | ORAL | Status: DC
  Administered 2015-11-06 – 2015-11-11 (×9): 1 via ORAL
  Filled 2015-11-06 (×11): qty 1

## 2015-11-06 MED ORDER — DIPHENOXYLATE-ATROPINE 2.5-0.025 MG PO TABS: 1.0000 | ORAL_TABLET | Freq: Four times a day (QID) | ORAL | Status: DC | PRN

## 2015-11-06 MED ORDER — SODIUM CHLORIDE 0.9 % IV SOLN: Freq: Once | INTRAVENOUS | Status: DC

## 2015-11-06 MED ORDER — PIPERACILLIN-TAZOBACTAM 3.375 G IVPB
3.3750 g | Freq: Three times a day (TID) | INTRAVENOUS | Status: DC
  Administered 2015-11-06 – 2015-11-09 (×7): 3.375 g via INTRAVENOUS
  Filled 2015-11-06 (×5): qty 50

## 2015-11-06 MED ORDER — ACETAMINOPHEN 325 MG PO TABS: 325.0000 mg | ORAL_TABLET | ORAL | Status: DC | PRN

## 2015-11-06 MED ORDER — ENOXAPARIN SODIUM 40 MG/0.4ML ~~LOC~~ SOLN
40.0000 mg | SUBCUTANEOUS | Status: DC
  Filled 2015-11-06 (×3): qty 0.4

## 2015-11-06 MED ORDER — ACETAMINOPHEN 500 MG PO TABS
1000.0000 mg | ORAL_TABLET | Freq: Four times a day (QID) | ORAL | Status: DC | PRN
  Administered 2015-11-06 – 2015-11-07 (×3): 1000 mg via ORAL
  Filled 2015-11-06 (×3): qty 2

## 2015-11-06 MED ORDER — POTASSIUM CHLORIDE CRYS ER 20 MEQ PO TBCR
40.0000 meq | EXTENDED_RELEASE_TABLET | Freq: Once | ORAL | Status: AC
  Administered 2015-11-06: 40 meq via ORAL
  Filled 2015-11-06: qty 2

## 2015-11-06 MED ORDER — SODIUM CHLORIDE 0.9 % IV BOLUS (SEPSIS)
1000.0000 mL | Freq: Once | INTRAVENOUS | Status: AC
  Administered 2015-11-06: 1000 mL via INTRAVENOUS

## 2015-11-06 MED ORDER — HEPARIN SOD (PORK) LOCK FLUSH 100 UNIT/ML IV SOLN: 500.0000 [IU] | Freq: Every day | INTRAVENOUS | Status: DC | PRN

## 2015-11-06 MED ORDER — POTASSIUM CHLORIDE ER 10 MEQ PO TBCR
20.0000 meq | EXTENDED_RELEASE_TABLET | Freq: Two times a day (BID) | ORAL | Status: DC
  Administered 2015-11-07 (×2): 20 meq via ORAL
  Filled 2015-11-06 (×5): qty 2

## 2015-11-06 MED ORDER — SODIUM CHLORIDE 0.9% FLUSH: 10.0000 mL | INTRAVENOUS | Status: DC | PRN

## 2015-11-06 MED ORDER — VANCOMYCIN HCL IN DEXTROSE 750-5 MG/150ML-% IV SOLN
750.0000 mg | Freq: Two times a day (BID) | INTRAVENOUS | Status: DC
  Administered 2015-11-07 – 2015-11-08 (×3): 750 mg via INTRAVENOUS
  Filled 2015-11-06 (×3): qty 150

## 2015-11-06 MED ORDER — SODIUM CHLORIDE 0.9 % IV BOLUS (SEPSIS)
2000.0000 mL | Freq: Once | INTRAVENOUS | Status: AC
  Administered 2015-11-06: 2000 mL via INTRAVENOUS

## 2015-11-06 MED ORDER — HEPARIN SOD (PORK) LOCK FLUSH 100 UNIT/ML IV SOLN: 250.0000 [IU] | INTRAVENOUS | Status: DC | PRN

## 2015-11-06 MED ORDER — VANCOMYCIN HCL IN DEXTROSE 1-5 GM/200ML-% IV SOLN
1000.0000 mg | Freq: Once | INTRAVENOUS | Status: AC
  Administered 2015-11-06: 1000 mg via INTRAVENOUS
  Filled 2015-11-06: qty 200

## 2015-11-06 MED ORDER — ONDANSETRON HCL 4 MG/2ML IJ SOLN: 4.0000 mg | Freq: Four times a day (QID) | INTRAMUSCULAR | Status: DC | PRN

## 2015-11-06 MED ORDER — ONDANSETRON HCL 4 MG PO TABS: 4.0000 mg | ORAL_TABLET | Freq: Four times a day (QID) | ORAL | Status: DC | PRN

## 2015-11-06 MED ORDER — PIPERACILLIN-TAZOBACTAM 3.375 G IVPB
3.3750 g | INTRAVENOUS | Status: AC
  Administered 2015-11-06: 3.375 g via INTRAVENOUS

## 2015-11-06 MED ORDER — HYDROMORPHONE HCL 1 MG/ML IJ SOLN: 1.0000 mg | INTRAMUSCULAR | Status: DC | PRN

## 2015-11-06 NOTE — Progress Notes (Signed)
This patient is here for a blood transfusion. Pt's temperature was 100.7 with the pre blood vitals. Temperature was rechecked and was 101.8. Temperature was checked again and was 100.8. Dr. Marko Plume was notified. Pt is alert, oriented and denies feeling hot. Will cont to monitor patient.

## 2015-11-06 NOTE — Assessment & Plan Note (Signed)
Potassium was 2.8 yesterday; and patient received potassium 20 mEq orally while in the [place].  Patient was also prescribed potassium 10 mEq to take twice daily while at home.  Potassium had improved today to 3.1.

## 2015-11-06 NOTE — Assessment & Plan Note (Signed)
Patient has history of chronic renal insufficiency; creatinine was 1.2 today.  Patient is also dehydrated today as well.

## 2015-11-06 NOTE — Progress Notes (Signed)
Pharmacy Antibiotic Note  Erin Santiago is a 60 y.o. female admitted on 11/06/2015 with sepsis.  She is seen at [facility] for endometrial stromal sarcoma with spinal metastasis & is currently undergoing Doxil chemotherapy (last dose 10/30/15).  She was scheduled to get blood transfusion today while was delayed due to fever & abdominal fisture that is oozing blood (Hg 8.0.  She was sent to ED for evaluation.  Pharmacy has been consulted for Vancomycin & Zosyn dosing.  11/06/2015:   Leucocytosis (WBC 40.5, ANC 34.3).  Received Neulasta on 5/16.    CRI- currently at patient's baseline.  Estimated CrCl ~ 62ml/min.   Low grade fever on admission (Tm 101.45F)  LA wnl  Hypotensive, tachycardic.    CXR normal  Plan: Zosyn 3.375g IV q8h (4 hour infusion).  Vancomycin 1gm IV x1 in Ed then 750mg  IV q12h (goal trough 15-39mcg/ml) Check Vancomycin trough at steady state Monitor renal function and cx data   Height: 5' (152.4 cm) Weight: 189 lb (85.73 kg) IBW/kg (Calculated) : 45.5  Temp (24hrs), Avg:99.5 F (37.5 C), Min:98.1 F (36.7 C), Max:100.7 F (38.2 C)   Recent Labs Lab 11/05/15 1202 11/06/15 1126 11/06/15 1134 11/06/15 1441  WBC 48.8*  --  40.5*  --   CREATININE 1.3* 1.2*  --   --   LATICACIDVEN  --   --   --  1.01    Estimated Creatinine Clearance: 48.5 mL/min (by C-G formula based on Cr of 1.2).    No Known Allergies  Antimicrobials this admission: 6/2  Vanc >>  6/2 Zosyn >>   Dose adjustments this admission:  Microbiology results: 6/2 BCx: sent (RN verified these were drawn at [facility] lab & sent out.  Not currently displaying in [facility]).   6/2 UCx: IP 6/2 Cdiff PCR: ordered 6/2 GI Panel:ordered  Thank you for allowing pharmacy to be a part of this patient's care.  Netta Cedars, PharmD, BCPS Pager: [phone] 11/06/2015 2:59 PM

## 2015-11-06 NOTE — Progress Notes (Signed)
Oral temperature ranging from 101 to 100.7 on pre transfusion VS check. Dr. Marko Plume notified and she said to hold off on the blood transfusion until she calls her office. Patient notified and will wait to hear how to proceed.

## 2015-11-06 NOTE — Assessment & Plan Note (Signed)
Patient does feel dehydrated today; admits to decreased oral intake recently.  Sodium was down to 132.  Patient will be transported to the emergency department for further evaluation and management.

## 2015-11-06 NOTE — Telephone Encounter (Signed)
Patient escorted to the ED. Report given to charge. Pt is resting in room 7. Nurse in room with patient.

## 2015-11-06 NOTE — ED Provider Notes (Signed)
CSN: [id number]     Arrival date & time 11/06/15  1314 History   First MD Initiated Contact with Patient 11/06/15 1320     No chief complaint on file.    (Consider location/radiation/quality/duration/timing/severity/associated sxs/prior Treatment) The history is provided by the patient.  Patient is a 60 yo F with a history of metastatic endometrial stromal sarcoma presenting to the ED from the cancer center. At cancer center today with a HgB of 8.0, an abdominal fistula that's oozing blood. Was receiving blood transfusion when she got a temp of 101.8 and a BP of 93/73. Patient states her last chemo was on 10/30/15. She reports feeling tired at home, however, no fevers or chills. States her appetite is "non-existent" and she intermittently feels nauseous due to chemo.  She has tried drinking boost and water but believes she is dehydrated. She is also having watery diarrhea since 11/02/15; 2-3 episodes per day. Denies having any hematochezia or melena. Reports having chronic lower abdominal pain due to her cancer but states her pain is worse lately. She has been having a non-productive cough for several months. Denies having any SOB or wheezing. Patient states she has had blood transfusions in the past and never had an allergic reaction.   Past Medical History  Diagnosis Date  . Abnormal uterine bleeding   . Anemia   . Fibroid   . Heart murmur     under 5 yrs of age  . Cancer ([place]) dx'd 07/2014    endometrial stromal sarcoma  . Radiation 12/01/14-01/05/15    pelvis 45 gray  . Radiation 05/18/2015-06/05/15    T4 thoracic spine area 35 gray   Past Surgical History  Procedure Laterality Date  . Laparoscopic gastric sleeve resection  2011  . Abdominal hysterectomy  07/2014    at [facility] History  Problem Relation Age of Onset  . Hypertension Mother   . Diabetes Mother   . Thyroid disease Father   . Heart attack Father   . Other Father     enlarged heart  . Non-Hodgkin's lymphoma Mother  64   Social History  Substance Use Topics  . Smoking status: Never Smoker   . Smokeless tobacco: Never Used  . Alcohol Use: No   OB History    Gravida Para Term Preterm AB TAB SAB Ectopic Multiple Living   0 0 0 0 0 0 0 0 0 0      Review of Systems  Constitutional: Positive for appetite change and fatigue. Negative for fever and chills.  HENT: Negative for congestion and sore throat.   Eyes: Negative for pain and visual disturbance.  Respiratory: Positive for cough. Negative for shortness of breath and wheezing.   Cardiovascular: Negative for chest pain and palpitations.  Gastrointestinal: Positive for nausea, vomiting, abdominal pain and diarrhea. Negative for constipation and blood in stool.  Genitourinary: Negative for flank pain and difficulty urinating.  Musculoskeletal: Negative for back pain and neck pain.  Skin: Negative for rash.  Neurological: Negative for dizziness, weakness, light-headedness and numbness.      Allergies  Review of patient's allergies indicates no known allergies.  Home Medications   Prior to Admission medications   Medication Sig Start Date End Date Taking? Authorizing Provider  ferrous fumarate (HEMOCYTE - 106 MG FE) 325 (106 Fe) MG TABS tablet Take 1 tablet (106 mg of iron total) by mouth daily. 10/19/15  Yes Lennis Marion Downer, MD  lidocaine-prilocaine (EMLA) cream Apply to Porta-cath 1-2 hrs prior to  access. Cover with [employer]. 08/18/14  Yes Lennis Marion Downer, MD  ondansetron (ZOFRAN) 8 MG tablet Take 1 tablet (8 mg total) by mouth every 8 (eight) hours as needed for nausea or vomiting. 10/30/15  Yes Lennis Marion Downer, MD  acetaminophen (TYLENOL) 325 MG tablet Take 650 mg by mouth. 09/28/15   Historical Provider, MD  chlorpheniramine-HYDROcodone (TUSSIONEX PENNKINETIC ER) 10-8 MG/5ML SUER Take 5 ml every 6-8 hrs as needed for cough. Patient not taking: Reported on 11/06/2015 11/05/15   Gordy Levan, MD  cholestyramine (QUESTRAN) 4 g packet Take 1  packet by mouth 2 (two) times daily. 09/28/15   Historical Provider, MD  diphenoxylate-atropine (LOMOTIL) 2.5-0.025 MG tablet Take 1 tablet by mouth 2 (two) times daily. 09/28/15   Historical Provider, MD  LORazepam (ATIVAN) 0.5 MG tablet Take 1 tablet (0.5 mg total) by mouth every 6 (six) hours as needed (nausea). May take sublingual Patient not taking: Reported on 11/06/2015 10/30/15   Gordy Levan, MD  losartan (COZAAR) 25 MG tablet Take 25 mg by mouth daily. 09/28/15   Historical Provider, MD  meloxicam (MOBIC) 15 MG tablet Take 15 mg by mouth daily. 09/06/15   Historical Provider, MD  nystatin (MYCOSTATIN) 100000 UNIT/ML suspension Take 5 mLs by mouth 4 (four) times daily. Reported on 11/05/2015 09/28/15   Historical Provider, MD  oxyCODONE (OXY IR/ROXICODONE) 5 MG immediate release tablet Take 1-2 tablets (5-10 mg total) by mouth every 4 (four) hours as needed for moderate pain, severe pain or breakthrough pain. Patient not taking: Reported on 10/19/2015 08/27/15   Eugenie Filler, MD  pantoprazole (PROTONIX) 40 MG tablet Take 40 mg by mouth daily.    Historical Provider, MD  potassium chloride (K-DUR) 10 MEQ tablet Take 1 tablet (10 mEq total) by mouth 2 (two) times daily. 11/05/15   Lennis P Livesay, MD   BP 146/76 mmHg  Pulse 104  Temp(Src) 98.1 F (36.7 C) (Oral)  Resp 16  Ht 5' (1.524 m)  Wt 85.73 kg  BMI 36.91 kg/m2  SpO2 100%  LMP 02/04/2014 Physical Exam  Constitutional: She is oriented to person, place, and time. She appears well-developed and well-nourished.  HENT:  Head: Normocephalic and atraumatic.  Mouth/Throat: No oropharyngeal exudate.  Tongue appears dry   Eyes: EOM are normal. Pupils are equal, round, and reactive to light.  Neck: Neck supple. No tracheal deviation present.  Cardiovascular: Regular rhythm and intact distal pulses.   Tachycardic   Pulmonary/Chest: Effort normal and breath sounds normal. No respiratory distress. She has no wheezes. She has no rales.   Abdominal: Soft. Bowel sounds are normal. She exhibits no distension. There is no rebound and no guarding.  Milder abdominal tenderness in the lower quadrants.  Abdominal fistula bag contains 10 cc of sanginous fluid.  Musculoskeletal: Normal range of motion.  Trace pitting edema of bilateral lower extremities  Site of chemo port insertion (right chest) does not appear infected. No erythema, increased warmth, or drainage.   Lymphadenopathy:    She has no cervical adenopathy.  Neurological: She is alert and oriented to person, place, and time.  Skin: Skin is warm and dry. No rash noted. No erythema.    ED Course  Procedures (including critical care time) Labs Review Labs Reviewed  COMPREHENSIVE METABOLIC PANEL  CBC WITH DIFFERENTIAL/PLATELET  LACTIC ACID, PLASMA    Imaging Review No results found. I have personally reviewed and evaluated these images and lab results as part of my medical decision-making.  EKG Interpretation None      MDM   Final diagnoses:  None   Patient is a 60 yo F with a history of metastatic endometrial stromal sarcoma presenting to the ED for fever, hypotension, and tachycardia concerning for sepsis.   Sepsis and abdominal pain  Patient is presenting with fever (temp of 101.8) and BP 93/73. WBC 40.5 but is elevated in the 20s since 09/2015. Lactic acid 1.01. She is also hypokalemic with K 3.1. She does have worsening abdominal pain which could possibly be due to the cancer. Hypotension likely from dehydration due to decreased PO intake and diarrhea. She has been having a non-productive cough but lungs clear on exam. CXR is not showing any acute abnormality. UA suggestive of an infection (nitrite positive). Fever could also possibly be due to an allergic reaction to transfusion. Patient's Hgb checked is 8.0, as such, will hold any further transfusion at this time.  -Wild hold off ordering CT of abdomen and pelvis at this time. However, if her abdominal  pain worsens or no other source of infection identified, consider ordering imaging of abdomen and pelvis. -Patient has already received 1L bolus IVF. Give another 2L IVF -Vancomycin and Zosyn for broad coverage -KDur 40 meq once   -Blood cx x2 pending  -Urine cx pending   -Stool GI panel pending  -Stool C-diff PCR pending   AKI Scr 1.2 with baseline approximately 0.8. Likely pre-renal due to dehydration and diarrhea. -Fluids as above   I spoke to Dr. Fanny Bien ([facility] hospitalist) and she is admitting the patient to her service.    Shela Leff, MD 11/06/15 1505  Shela Leff, MD 11/06/15 1601  Shela Leff, MD 11/06/15 1603  Elnora Morrison, MD 11/08/15 [phone]

## 2015-11-06 NOTE — Assessment & Plan Note (Signed)
Patient has a chronic right abdominal mucous fistula that is now draining more bloody output within the past week or so.  Hemoglobin has dropped down to 8.0 today.  Patient feels increasingly fatigued; but denies any shortness of breath with exertion.  Patient was scheduled to receive 2 units packed red blood cells transfusion today in the sickle cell unit-but was noted to have a temperature to maximum of 101.8 all in the sickle cell earlier this morning.  Patient did not receive any of the blood transfusion as planned; and will be transported to the emergency department instead for further evaluation and management.  Brief history and report were called to the emergency department charge nurse; prior to the patient being transported to the emergency department via wheelchair.  Per the cancer Center nurse.

## 2015-11-06 NOTE — Telephone Encounter (Signed)
-----   Message from Gordy Levan, MD sent at 11/06/2015  8:55 AM EDT ----- Labs seen and need follow up: can let her know iron is very low; we discussed IV iron if so. Could do it next week, or with my visit 6-15 if she prefers. Lab day of infusion if next week. I will put in orders and request preauth, can move date of orders if needed    Still will not hurt to continue oral iron, but doubt oral will be sufficient with ongoing bleeding  thanks

## 2015-11-06 NOTE — Assessment & Plan Note (Signed)
Albumin continues low at 2.5.  Patient was encouraged to push protein in her diet is much as possible.

## 2015-11-06 NOTE — Assessment & Plan Note (Signed)
Patient states that she continues with some chronic diarrhea; stating that she has up to 3 diarrhea episodes per day.  She has been taking Lomotil as needed.

## 2015-11-06 NOTE — ED Notes (Signed)
Attempted to call report. RN unavailable 

## 2015-11-06 NOTE — ED Notes (Signed)
Verified with cancer center lab that blood cultures were drawn.

## 2015-11-06 NOTE — Telephone Encounter (Signed)
Per Dr. Marko Plume pt was to get blood transfusion today in sickle cell. Pt has a fever of 100.8. Transfusion cancelled and RN advised to have pt come to the [facility] and register for lab, flush and [facility].   Labs entered and POF sent to make appts. Pt is en route.

## 2015-11-06 NOTE — ED Notes (Signed)
Called by NP. Hx of Endo cancer with spinal mets, last chemo 5/26. At cancer center today with a HgB of 8.0, an abdominal fistula that's oozing blood. Was receiving blood transfusion when she got a temp of 101.8 and a BP of 93/73. Sending over here d/t possible sepsis or need for abdominal CT

## 2015-11-06 NOTE — Assessment & Plan Note (Signed)
Patient has a chronic right abdominal mucous fistula that is now draining more bloody output within the past week or so.  Hemoglobin has dropped down to 8.0 today.  Patient feels increasingly fatigued; but denies any shortness of breath with exertion.  Patient was scheduled to receive 2 units packed red blood cells transfusion today in the sickle cell unit-but was noted to have a temperature to maximum of 101.8 all in the sickle cell earlier this morning.  Patient denies any URI symptoms; but does admit to a congested cough that she is associated with some sinus drainage.  She denies any UTI symptoms as well; but does admit to leaking some urine on one occasion within this past week.  She was unable to give a urine sample while at the cancer center.  Blood cultures 2 were obtained while at the cancer center and are pending results.   Patient did not receive any of the blood transfusion as planned; and will be transported to the emergency department instead for further evaluation and management.  Brief history and report were called to the emergency department charge nurse; prior to the patient being transported to the emergency department via wheelchair.  Per the cancer Center nurse.  Note: Patient should be considered a full code; since there are no advanced directives in patient's chart.

## 2015-11-06 NOTE — Assessment & Plan Note (Signed)
Bilirubin continues to be elevated at 1.29.  This could be secondary to dehydration.  Will need to continue to monitor closely.

## 2015-11-06 NOTE — Assessment & Plan Note (Signed)
Patient received cycle one of her Doxil chemotherapy on Friday, 10/30/2015.  She is scheduled to return for labs, flush, and a visit on 11/19/2015.

## 2015-11-06 NOTE — Assessment & Plan Note (Signed)
Blood pressure checked while at the [facility] today was 93/73, and her heart rate at 111.  Patient states that she has been holding the Cozaar per instructions of Dr. Marko Plume.  She did not take any blood pressure medications earlier this morning.  Most likely, patient's hypertension is secondary to dehydration and anemia.  However, patient is febrile today; and differential diagnosis should also include possible impending sepsis.  Patient will be transported to the emergency department for further evaluation and management today.

## 2015-11-06 NOTE — ED Notes (Signed)
Bed: WA07 Expected date:  Expected time:  Means of arrival:  Comments: Ca Patient

## 2015-11-06 NOTE — Telephone Encounter (Signed)
Pt was admitted today through the ED.

## 2015-11-06 NOTE — ED Notes (Signed)
Dr. Broadus John made aware of pt's temperature.

## 2015-11-06 NOTE — Progress Notes (Signed)
Cancer center nursing called and said for [place] to send Erin Santiago down to the [facility] for symptom management. Patient notified and Dr. Marko Plume called to confirm the order to send patient to the cancer center for symptom management. Patient transported to the [facility] via wheelchair. Alert, oriented and ambulatory to the wheelchair. Patient will notify her family/friends.

## 2015-11-06 NOTE — Telephone Encounter (Signed)
Told Erin Santiago the results of the iron studies as noted below by Dr. Marko Plume. V Erin Santiago open to receive IV iron next week or with visit on 11-19-15 Did not  send POF to Scheduling as Erin. Santiago was waiting to see Symptom Management Nurse for fever and possible admission.

## 2015-11-06 NOTE — Progress Notes (Signed)
SYMPTOM MANAGEMENT CLINIC    Chief Complaint: Fever, dehydration, hypotension  HPI:  Erin Santiago 60 y.o. 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  Currently undergoing Doxil chemotherapy therapy regimen.   Patient has a chronic right abdominal mucous fistula that is now draining more bloody output within the past week or so.  Hemoglobin has dropped down to 8.0 today.  Patient feels increasingly fatigued; but denies any shortness of breath with exertion.  Patient was scheduled to receive 2 units packed red blood cells transfusion today in the sickle cell unit-but was noted to have a temperature to maximum of 101.8 all in the sickle cell earlier this morning.  Patient denies any URI symptoms; but does admit to a congested cough that she is associated with some sinus drainage.  She denies any UTI symptoms as well; but does admit to leaking some urine on one occasion within this past week.  She was unable to give a urine sample while at the cancer center.  Blood cultures 2 were obtained while at the cancer center and are pending results.   Patient did not receive any of the blood transfusion as planned; and will be transported to the emergency department instead for further evaluation and management.  Brief history and report were called to the emergency department charge nurse; prior to the patient being transported to the emergency department via wheelchair.  Per the cancer Center nurse.  Note: Patient should be considered a full code; since there are no advanced directives in patient's chart.   No history exists.    Review of Systems  Constitutional: Positive for fever and malaise/fatigue. Negative for chills.  Respiratory: Positive for cough. Negative for hemoptysis, sputum production, shortness of breath and wheezing.   Gastrointestinal: Positive for abdominal pain and diarrhea.  All other systems reviewed and are negative.   Past Medical History    Diagnosis Date  . Abnormal uterine bleeding   . Anemia   . Fibroid   . Heart murmur     under 5 yrs of age  . Cancer ([place]) dx'd 07/2014    endometrial stromal sarcoma  . Radiation 12/01/14-01/05/15    pelvis 45 gray  . Radiation 05/18/2015-06/05/15    T4 thoracic spine area 35 gray    Past Surgical History  Procedure Laterality Date  . Laparoscopic gastric sleeve resection  2011  . 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[place]); 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 [facility]); Port catheter in place; Metastatic cancer to spine [facility]); Diverticulitis of large intestine without perforation or abscess without bleeding; Abscess of abdominal cavity ([place]); Intra-abdominal abscess ([place]); Protein-calorie malnutrition, moderate ([place]); Pressure ulcer; Physical deconditioning; Abdominal abscess ([place]); Pelvic abscess in female; Perforation bowel ([place]); Other hydronephrosis; Chronic GI bleeding; Hypokalemia due to loss of potassium; Hyperbilirubinemia; Hypoalbuminemia due to protein-calorie malnutrition ([place]); Fever; Chronic renal insufficiency; Cancer associated pain; Diarrhea; and Hypotension on her problem list.    has No Known Allergies.    Medication List       This list is accurate as of: 11/06/15  1:14 PM.  Always use  your most recent med list.               acetaminophen 325 MG tablet  Commonly known as:  TYLENOL  Take 650 mg by mouth.     chlorpheniramine-HYDROcodone 10-8 MG/5ML Suer  Commonly known as:   TUSSIONEX PENNKINETIC ER  Take 5 ml every 6-8 hrs as needed for cough.     cholestyramine 4 g packet  Commonly known as:  QUESTRAN  Take 1 packet by mouth 2 (two) times daily.     diphenoxylate-atropine 2.5-0.025 MG tablet  Commonly known as:  LOMOTIL  Take 1 tablet by mouth 2 (two) times daily.     ferrous fumarate 325 (106 Fe) MG Tabs tablet  Commonly known as:  HEMOCYTE - 106 mg FE  Take 1 tablet (106 mg of iron total) by mouth daily.     lidocaine-prilocaine cream  Commonly known as:  EMLA  Apply to Porta-cath 1-2 hrs prior to access. Cover with [employer].     LORazepam 0.5 MG tablet  Commonly known as:  ATIVAN  Take 1 tablet (0.5 mg total) by mouth every 6 (six) hours as needed (nausea). May take sublingual     losartan 25 MG tablet  Commonly known as:  COZAAR  Take 25 mg by mouth daily.     meloxicam 15 MG tablet  Commonly known as:  MOBIC  Take 15 mg by mouth daily.     nystatin 100000 UNIT/ML suspension  Commonly known as:  MYCOSTATIN  Take 5 mLs by mouth 4 (four) times daily. Reported on 11/05/2015     ondansetron 8 MG tablet  Commonly known as:  ZOFRAN  Take 1 tablet (8 mg total) by mouth every 8 (eight) hours as needed for nausea or vomiting.     oxyCODONE 5 MG immediate release tablet  Commonly known as:  Oxy IR/ROXICODONE  Take 1-2 tablets (5-10 mg total) by mouth every 4 (four) hours as needed for moderate pain, severe pain or breakthrough pain.     pantoprazole 40 MG tablet  Commonly known as:  PROTONIX  Take 40 mg by mouth daily.     potassium chloride 10 MEQ tablet  Commonly known as:  K-DUR  Take 1 tablet (10 mEq total) by mouth 2 (two) times daily.         PHYSICAL EXAMINATION  Oncology Vitals 11/06/2015 11/06/2015  Height - 152 cm  Weight - 85.73 kg  Weight (lbs) - 189 lbs  BMI (kg/m2) - 36.91 kg/m2  Temp - 98.1  Pulse - 104  Resp - 16  SpO2 100 100  BSA (m2) - 1.9 m2   BP Readings from Last 2 Encounters:  11/06/15 93/73  11/06/15  146/76    Physical Exam  Constitutional: She is oriented to person, place, and time. She appears dehydrated. She appears unhealthy.  HENT:  Head: Normocephalic and atraumatic.  Mouth/Throat: Oropharynx is clear and moist.  Eyes: Conjunctivae and EOM are normal. Pupils are equal, round, and reactive to light. Right eye exhibits no discharge. Left eye exhibits no discharge. No scleral icterus.  Neck: Normal range of motion. Neck supple. No JVD present. No tracheal deviation present. No thyromegaly present.  Cardiovascular: Normal heart sounds and intact distal pulses.   Tachycardic  Pulmonary/Chest: Effort normal and breath sounds normal. No respiratory distress. She has no wheezes. She has no rales. She exhibits no tenderness.  Patient noted to have an occasional dry cough only.  Abdominal: Soft. Bowel sounds are  normal. She exhibits no distension and no mass. There is no tenderness. There is no rebound and no guarding.  Right lower abdominal fistula with ostomy intact and draining dark bloody output.  No obvious abdominal discomfort with palpation.  Musculoskeletal: Normal range of motion. She exhibits no edema or tenderness.  Lymphadenopathy:    She has no cervical adenopathy.  Neurological: She is alert and oriented to person, place, and time. Gait normal.  Skin: Skin is warm and dry. No rash noted. No erythema. There is pallor.  Psychiatric: Affect normal.  Nursing note and vitals reviewed.   LABORATORY DATA:. Appointment on 11/06/2015  Component Date Value Ref Range Status  . Sodium 11/06/2015 132* 136 - 145 mEq/L Final  . Potassium 11/06/2015 3.1* 3.5 - 5.1 mEq/L Final  . Chloride 11/06/2015 101  98 - 109 mEq/L Final  . CO2 11/06/2015 19* 22 - 29 mEq/L Final  . Glucose 11/06/2015 105  70 - 140 mg/dl Final   Glucose reference range is for nonfasting patients. Fasting glucose reference range is 70- 100.  Marland Kitchen BUN 11/06/2015 11.4  7.0 - 26.0 mg/dL Final  . Creatinine 11/06/2015 1.2*  0.6 - 1.1 mg/dL Final  . Total Bilirubin 11/06/2015 1.29* 0.20 - 1.20 mg/dL Final  . Alkaline Phosphatase 11/06/2015 126  40 - 150 U/L Final  . AST 11/06/2015 9  5 - 34 U/L Final  . ALT 11/06/2015 9  0 - 55 U/L Final  . Total Protein 11/06/2015 6.6  6.4 - 8.3 g/dL Final  . Albumin 11/06/2015 2.5* 3.5 - 5.0 g/dL Final  . Calcium 11/06/2015 8.7  8.4 - 10.4 mg/dL Final  . Anion Gap 11/06/2015 12* 3 - 11 mEq/L Final  . EGFR 11/06/2015 49* >90 ml/min/1.73 m2 Final   eGFR is calculated using the CKD-EPI Creatinine Equation (2009)  . WBC 11/06/2015 40.5* 3.9 - 10.3 10e3/uL Final  . NEUT# 11/06/2015 34.3* 1.5 - 6.5 10e3/uL Final  . HGB 11/06/2015 8.0* 11.6 - 15.9 g/dL Final  . HCT 11/06/2015 23.8* 34.8 - 46.6 % Final  . Platelets 11/06/2015 304  145 - 400 10e3/uL Final  . MCV 11/06/2015 85.3  79.5 - 101.0 fL Final  . MCH 11/06/2015 28.7  25.1 - 34.0 pg Final  . MCHC 11/06/2015 33.6  31.5 - 36.0 g/dL Final  . RBC 11/06/2015 2.79* 3.70 - 5.45 10e6/uL Final  . RDW 11/06/2015 15.4* 11.2 - 14.5 % Final  . lymph# 11/06/2015 0.5* 0.9 - 3.3 10e3/uL Final  . MONO# 11/06/2015 1.9* 0.1 - 0.9 10e3/uL Final  . Eosinophils Absolute 11/06/2015 3.9* 0.0 - 0.5 10e3/uL Final  . Basophils Absolute 11/06/2015 0.1  0.0 - 0.1 10e3/uL Final  . NEUT% 11/06/2015 84.5* 38.4 - 76.8 % Final  . LYMPH% 11/06/2015 1.1* 14.0 - 49.7 % Final  . MONO% 11/06/2015 4.7  0.0 - 14.0 % Final  . EOS% 11/06/2015 9.6* 0.0 - 7.0 % Final  . BASO% 11/06/2015 0.1  0.0 - 2.0 % Final  . Technologist Review 11/06/2015 Sl toxic granulation, Occ vacuoles and dohle bodies   Final  Admission on 11/06/2015  Component Date Value Ref Range Status  . Lactic Acid, Venous 11/06/2015 1.01  0.5 - 2.0 mmol/L Final  Hospital Outpatient Visit on 11/06/2015  Component Date Value Ref Range Status  . Order Confirmation 11/06/2015 ORDER PROCESSED BY BLOOD BANK   Final  . Order Confirmation 11/06/2015 ORDER PROCESSED BY BLOOD BANK   Final  Appointment on  11/05/2015  Component Date Value  Ref Range Status  . WBC 11/05/2015 48.8* 3.9 - 10.3 10e3/uL Final  . NEUT# 11/05/2015 42.2* 1.5 - 6.5 10e3/uL Final  . HGB 11/05/2015 8.3* 11.6 - 15.9 g/dL Final  . HCT 11/05/2015 25.6* 34.8 - 46.6 % Final  . Platelets 11/05/2015 437* 145 - 400 10e3/uL Final  . MCV 11/05/2015 86.4  79.5 - 101.0 fL Final  . MCH 11/05/2015 28.0  25.1 - 34.0 pg Final  . MCHC 11/05/2015 32.4  31.5 - 36.0 g/dL Final  . RBC 11/05/2015 2.96* 3.70 - 5.45 10e6/uL Final  . RDW 11/05/2015 15.3* 11.2 - 14.5 % Final  . lymph# 11/05/2015 0.6* 0.9 - 3.3 10e3/uL Final  . MONO# 11/05/2015 1.5* 0.1 - 0.9 10e3/uL Final  . Eosinophils Absolute 11/05/2015 4.4* 0.0 - 0.5 10e3/uL Final  . Basophils Absolute 11/05/2015 0.1  0.0 - 0.1 10e3/uL Final  . NEUT% 11/05/2015 86.4* 38.4 - 76.8 % Final  . LYMPH% 11/05/2015 1.3* 14.0 - 49.7 % Final  . MONO% 11/05/2015 3.1  0.0 - 14.0 % Final  . EOS% 11/05/2015 9.0* 0.0 - 7.0 % Final  . BASO% 11/05/2015 0.2  0.0 - 2.0 % Final  . Sodium 11/05/2015 132* 136 - 145 mEq/L Final  . Potassium 11/05/2015 2.8* 3.5 - 5.1 mEq/L Final  . Chloride 11/05/2015 102  98 - 109 mEq/L Final  . CO2 11/05/2015 22  22 - 29 mEq/L Final  . Glucose 11/05/2015 131  70 - 140 mg/dl Final   Glucose reference range is for nonfasting patients. Fasting glucose reference range is 70- 100.  Marland Kitchen BUN 11/05/2015 11.4  7.0 - 26.0 mg/dL Final  . Creatinine 11/05/2015 1.3* 0.6 - 1.1 mg/dL Final  . Total Bilirubin 11/05/2015 1.23* 0.20 - 1.20 mg/dL Final  . Alkaline Phosphatase 11/05/2015 142  40 - 150 U/L Final  . AST 11/05/2015 11  5 - 34 U/L Final  . ALT 11/05/2015 9  0 - 55 U/L Final  . Total Protein 11/05/2015 6.8  6.4 - 8.3 g/dL Final  . Albumin 11/05/2015 2.6* 3.5 - 5.0 g/dL Final  . Calcium 11/05/2015 8.8  8.4 - 10.4 mg/dL Final  . Anion Gap 11/05/2015 8  3 - 11 mEq/L Final  . EGFR 11/05/2015 45* >90 ml/min/1.73 m2 Final   eGFR is calculated using the CKD-EPI Creatinine Equation  (2009)  . Iron 11/05/2015 14* 41 - 142 ug/dL Final  . TIBC 11/05/2015 173* 236 - 444 ug/dL Final  . UIBC 11/05/2015 159  120 - 384 ug/dL Final  . %SAT 11/05/2015 8* 21 - 57 % Final  Hospital Outpatient Visit on 11/05/2015  Component Date Value Ref Range Status  . Order Confirmation 11/06/2015 ORDER PROCESSED BY BLOOD BANK   Final  . ABO/RH(D) 11/05/2015 O POS   Final  . Antibody Screen 11/05/2015 NEG   Final  . Sample Expiration 11/05/2015 11/08/2015   Final  . Unit Number 11/05/2015 [id number]   Final  . Blood Component Type 11/05/2015 RED CELLS,LR   Final  . Unit division 11/05/2015 00   Final  . Status of Unit 11/05/2015 ALLOCATED   Final  . Transfusion Status 11/05/2015 OK TO TRANSFUSE   Final  . Crossmatch Result 11/05/2015 Compatible   Final  . Unit Number 11/05/2015 [id number]   Final  . Blood Component Type 11/05/2015 RED CELLS,LR   Final  . Unit division 11/05/2015 00   Final  . Status of Unit 11/05/2015 ALLOCATED   Final  . Transfusion Status 11/05/2015 OK  TO TRANSFUSE   Final  . Crossmatch Result 11/05/2015 Compatible   Final    RADIOGRAPHIC STUDIES: No results found.  ASSESSMENT/PLAN:    Endometrial stromal sarcoma [facility]) Patient received cycle one of her Doxil chemotherapy on Friday, 10/30/2015.  She is scheduled to return for labs, flush, and a visit on 11/19/2015.  Iron deficiency anemia due to chronic blood loss Patient has a chronic right abdominal mucous fistula that is now draining more bloody output within the past week or so.  Hemoglobin has dropped down to 8.0 today.  Patient feels increasingly fatigued; but denies any shortness of breath with exertion.  Patient was scheduled to receive 2 units packed red blood cells transfusion today in the sickle cell unit-but was noted to have a temperature to maximum of 101.8 all in the sickle cell earlier this morning.  Patient did not receive any of the blood transfusion as planned; and will be transported to the  emergency department instead for further evaluation and management.  Brief history and report were called to the emergency department charge nurse; prior to the patient being transported to the emergency department via wheelchair.  Per the cancer Center nurse.  Dehydration Patient does feel dehydrated today; admits to decreased oral intake recently.  Sodium was down to 132.  Patient will be transported to the emergency department for further evaluation and management.  Hypokalemia due to loss of potassium Potassium was 2.8 yesterday; and patient received potassium 20 mEq orally while in the [place].  Patient was also prescribed potassium 10 mEq to take twice daily while at home.  Potassium had improved today to 3.1.  Hyperbilirubinemia Bilirubin continues to be elevated at 1.29.  This could be secondary to dehydration.  Will need to continue to monitor closely.  Hypoalbuminemia due to protein-calorie malnutrition ([facility]) Albumin continues low at 2.5.  Patient was encouraged to push protein in her diet is much as possible.  Fever Patient has a chronic right abdominal mucous fistula that is now draining more bloody output within the past week or so.  Hemoglobin has dropped down to 8.0 today.  Patient feels increasingly fatigued; but denies any shortness of breath with exertion.  Patient was scheduled to receive 2 units packed red blood cells transfusion today in the sickle cell unit-but was noted to have a temperature to maximum of 101.8 all in the sickle cell earlier this morning.  Patient denies any URI symptoms; but does admit to a congested cough that she is associated with some sinus drainage.  She denies any UTI symptoms as well; but does admit to leaking some urine on one occasion within this past week.  She was unable to give a urine sample while at the cancer center.  Blood cultures 2 were obtained while at the cancer center and are pending results.   Patient did not receive any  of the blood transfusion as planned; and will be transported to the emergency department instead for further evaluation and management.  Brief history and report were called to the emergency department charge nurse; prior to the patient being transported to the emergency department via wheelchair.  Per the cancer Center nurse.  Note: Patient should be considered a full code; since there are no advanced directives in patient's chart.  Chronic renal insufficiency Patient has history of chronic renal insufficiency; creatinine was 1.2 today.  Patient is also dehydrated today as well.  Cancer associated pain Patient states that she is experiencing some mildly increased abdominal discomfort and aching  to the area surrounding her right abdominal fistula site.  Patient states that she does not take any pain medication at home.  At this time.  Diarrhea Patient states that she continues with some chronic diarrhea; stating that she has up to 3 diarrhea episodes per day.  She has been taking Lomotil as needed.  Hypotension Blood pressure checked while at the cancer Center today was 93/73, and her heart rate at 111.  Patient states that she has been holding the Cozaar per instructions of Dr. Marko Plume.  She did not take any blood pressure medications earlier this morning.  Most likely, patient's hypertension is secondary to dehydration and anemia.  However, patient is febrile today; and differential diagnosis should also include possible impending sepsis.  Patient will be transported to the emergency department for further evaluation and management today.   Patient stated understanding of all instructions; and was in agreement with this plan of care. The patient knows to call the clinic with any problems, questions or concerns.   Total time spent with patient was 40 minutes;  with greater than 75 percent of that time spent in face to face counseling regarding patient's symptoms,  and coordination of care  and follow up.  Disclaimer:This dictation was prepared with Dragon/digital dictation along with [employer]. Any transcriptional errors that result from this process are unintentional.  Drue Second, NP 11/06/2015

## 2015-11-06 NOTE — Assessment & Plan Note (Signed)
Patient states that she is experiencing some mildly increased abdominal discomfort and aching to the area surrounding her right abdominal fistula site.  Patient states that she does not take any pain medication at home.  At this time.

## 2015-11-06 NOTE — ED Notes (Addendum)
Pt reports last chemotherapy 10/30/15; new onset poor intake and diarrhea onset 11/02/15.

## 2015-11-06 NOTE — H&P (Addendum)
[facility] History and Physical  Erin Santiago [id number] DOB: [birth year] DOA: 11/06/2015  Referring physician: EDP PCP: Merrilee Seashore, MD   Chief Complaint: Fever  HPI: Erin Santiago is a 60 y.o. 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 She just started palliative chemotherapy after 4 month hiatus, last Friday, she went to the [place] for blood transfusion and was found to be febrile up to 101, and tachycardic hence she was sent to the emergency room for further evaluation and management. She has chronic diarrhea for the last few months she takes Imodium when necessary. She has chronic lower abdominal discomfort ongoing for several months as well which is unchanged now. 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 She also reports some dysuria for 1 day. ED Course: In the ER, she was noted to have sodium of 132, potassium of 3.1, WBC of 40.5, hemoglobin of 8, urinalysis with many bacteria positive nitrite and leuk esterase and numerous WBCs. She was given a fluid bolus, IV vancomycin and Zosyn and reports feeling better  Review of Systems: Positives bolded Constitutional:  No weight loss, night sweats, Fevers, chills, fatigue.  HEENT:  No headaches, Difficulty swallowing,Tooth/dental problems,Sore throat,  No sneezing, itching, ear  ache, nasal congestion, post nasal drip,  Cardio-vascular:  No chest pain, Orthopnea, PND, swelling in lower extremities, anasarca, dizziness, palpitations  GI:  No heartburn, indigestion, abdominal pain, nausea, vomiting, diarrhea, change in bowel habits, loss of appetite  Resp:  No shortness of breath with exertion or at rest. No excess mucus, no productive cough, No non-productive cough, No coughing up of blood.No change in color of mucus.No wheezing.No chest wall deformity  Skin:  no rash or lesions.  GU:  no dysuria, change in color of urine, no urgency or frequency. No flank pain.  Musculoskeletal:  No joint pain or swelling. No decreased range of motion. No back pain.  Psych:  No change in mood or affect. No depression or anxiety. No memory loss.   Past Medical History  Diagnosis Date  . Abnormal uterine bleeding   . Anemia   . Fibroid   . Heart murmur     under 5 yrs of age  . Cancer ([place]) dx'd 07/2014    endometrial stromal sarcoma  . Radiation 12/01/14-01/05/15    pelvis 45 gray  . Radiation 05/18/2015-06/05/15    T4 thoracic spine area 35 gray   Past Surgical History  Procedure Laterality Date  . Laparoscopic gastric sleeve resection  2011  . Abdominal hysterectomy  07/2014    at [place] History:  reports that she has never smoked. She has never used smokeless tobacco. She reports that she does not drink alcohol or use illicit drugs.  No Known Allergies  Family History  Problem Relation Age of Onset  . Hypertension Mother   . Diabetes Mother   . Thyroid disease Father   . Heart attack Father   . Other  Father     enlarged heart  . Non-Hodgkin's lymphoma Mother 101    Prior to Admission medications   Medication Sig Start Date End Date Taking? Authorizing Provider  acetaminophen (TYLENOL) 500 MG tablet Take 1,000 mg by mouth every 6 (six) hours as needed for moderate pain.   Yes Historical Provider, MD  cholestyramine (QUESTRAN) 4 g packet Take 1 packet  by mouth 2 (two) times daily. 09/28/15  Yes Historical Provider, MD  diphenoxylate-atropine (LOMOTIL) 2.5-0.025 MG tablet Take 1 tablet by mouth 2 (two) times daily. 09/28/15  Yes Historical Provider, MD  ferrous fumarate (HEMOCYTE - 106 MG FE) 325 (106 Fe) MG TABS tablet Take 1 tablet (106 mg of iron total) by mouth daily. 10/19/15  Yes Lennis Marion Downer, MD  lidocaine-prilocaine (EMLA) cream Apply to Porta-cath 1-2 hrs prior to access. Cover with [employer]. 08/18/14  Yes Lennis Marion Downer, MD  meloxicam (MOBIC) 15 MG tablet Take 15 mg by mouth daily. 09/06/15  Yes Historical Provider, MD  ondansetron (ZOFRAN) 8 MG tablet Take 1 tablet (8 mg total) by mouth every 8 (eight) hours as needed for nausea or vomiting. 10/30/15  Yes Lennis P Livesay, MD  pantoprazole (PROTONIX) 40 MG tablet Take 40 mg by mouth daily.   Yes Historical Provider, MD  chlorpheniramine-HYDROcodone (TUSSIONEX PENNKINETIC ER) 10-8 MG/5ML SUER Take 5 ml every 6-8 hrs as needed for cough. Patient not taking: Reported on 11/06/2015 11/05/15   Gordy Levan, MD  LORazepam (ATIVAN) 0.5 MG tablet Take 1 tablet (0.5 mg total) by mouth every 6 (six) hours as needed (nausea). May take sublingual Patient not taking: Reported on 11/06/2015 10/30/15   Gordy Levan, MD  oxyCODONE (OXY IR/ROXICODONE) 5 MG immediate release tablet Take 1-2 tablets (5-10 mg total) by mouth every 4 (four) hours as needed for moderate pain, severe pain or breakthrough pain. Patient not taking: Reported on 10/19/2015 08/27/15   Eugenie Filler, MD  potassium chloride (K-DUR) 10 MEQ tablet Take 1 tablet (10 mEq total) by mouth 2 (two) times daily. 11/05/15   Lennis Marion Downer, MD   Physical Exam: Filed Vitals:   11/06/15 1506 11/06/15 1526 11/06/15 1546 11/06/15 1551  BP: 132/69  144/58   Pulse: 111 112 115   Temp: 99.5 F (37.5 C)   101.3 F (38.5 C)  TempSrc: Oral     Resp: 18  18   Height:      Weight:      SpO2: 96% 94% 97%     Wt Readings from Last 3  Encounters:  11/06/15 85.73 kg (189 lb)  11/05/15 85.866 kg (189 lb 4.8 oz)  10/30/15 86.637 kg (191 lb)    General:  Appears calm and comfortable, chronically ill-appearing obese,  Eyes: PERRLpallor noted NT: grossly normal hearing,  dry lips & tongue Neck: no LAD, masses or thyromegaly Cardiovascular: RRR, no m/r/g.  1+LE edema. Telemetry: SR, no arrhythmias  Respiratory: CTA bilaterally, no w/r/r. Normal respiratory effort. Abdomen: softobese, nontender, nondistended, mucocutaneous fistula with blood-tinged drainage Skin: no rash or induration seen on limited exam Musculoskeletal: grossly normal tone BUE/BLE PsychiatricFlat affect, avoids eye contact Neurologic : grossly non-focal, very deconditioned           Labs on Admission:  Basic Metabolic Panel:  Recent Labs Lab 11/05/15 1202 11/06/15 1126  NA 132* 132*  K 2.8* 3.1*  CO2 22 19*  GLUCOSE 131 105  BUN 11.4 11.4  CREATININE 1.3* 1.2*  CALCIUM 8.8 8.7  Liver Function Tests:  Recent Labs Lab 11/05/15 1202 11/06/15 1126  AST 11 9  ALT 9 9  ALKPHOS 142 126  BILITOT 1.23* 1.29*  PROT 6.8 6.6  ALBUMIN 2.6* 2.5*   No results for input(s): LIPASE, AMYLASE in the last 168 hours. No results for input(s): AMMONIA in the last 168 hours. CBC:  Recent Labs Lab 11/05/15 1202 11/06/15 1134  WBC 48.8* 40.5*  NEUTROABS 42.2* 34.3*  HGB 8.3* 8.0*  HCT 25.6* 23.8*  MCV 86.4 85.3  PLT 437* 304   Cardiac Enzymes: No results for input(s): CKTOTAL, CKMB, CKMBINDEX, TROPONINI in the last 168 hours.  BNP (last 3 results) No results for input(s): BNP in the last 8760 hours.  ProBNP (last 3 results) No results for input(s): PROBNP in the last 8760 hours.  CBG: No results for input(s): GLUCAP in the last 168 hours.  Radiological Exams on Admission: Dg Chest 2 View  11/06/2015  CLINICAL DATA:  Cough and fever, several days duration. Endometrial cancer. EXAM: CHEST  2 VIEW COMPARISON:  08/21/2015 FINDINGS: Power  port has its tip in the SVC above the right atrium. Heart size is normal. Mediastinal shadows are normal. The lungs are clear. The vascularity is normal. No effusions. Chronic curvature in degeneration of the spine. IMPRESSION: No active cardiopulmonary disease. Electronically Signed   By: Nelson Chimes M.D.   On: 11/06/2015 14:54    EKG: Independently reviewed. Sinus tachycardia, no acute St T wave changes  Assessment/Plan  1. Fever/SIRS -Blood pressure stable, lactate reassuring -UA grossly abnormal, suggestive of UTI with positive nitrite, leukocyte esterase trace, numerous WBC and bacteria -It is possible that she has an intra-abdominal process/infection that is brewing, however I suspect her options will be significantly limited due to the advanced nature of her underlying cancer and disease progression, and h/o abd surgeries/drains. -Will order CT abdomen pelvis in case of any new abdominal symptoms or clinical deterioration -last CT 5/18 at [facility] with progression of intra Abd disease, and some hydronephrosis  2. Widely metastatic endometrial sarcoma with progression of disease in the abdomen/pelvis, metastases to the spine, possibly long just started palliative chemotherapy last week -Severely limited in terms of available options given the complexity of her disease -Hospice was discussed last month, patient elected for palliative chemotherapy instead -Defer to Dr. Marko Plume, suspect she'll need a palliative care consultation soon,she chooses to remain a full code at this time despite understanding the severity and extent of her disease  3. Leukocytosis -Baseline W BC count in the 25-30,000 range -Now elevated at 40.5 K with fever, monitor with above treatment -Low threshold to scan her abdomen however options significantly limited, last CT 5/18 at [facility] with progression of intra Abd disease, and some hydronephrosis  4. 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 Mild to moderate hydronephrosis noted on last CT abdomen pelvis in [place] on 5/18 -Making good amounts of urine, with her making good amounts of urine at this time doubt intervention/stenting would be required at this time  6. Mild Aki -Baseline creatinine close to 1 now it is 1.2 -Hydrate, monitor  7. Severe protein calorie malnutrition -Related to progressive malignancy and intra-abdominal complications    Code Status: Full Code DVT Prophylaxis:lovenox Family Communication:No family at bedside Disposition Plan: inpatient, to be determined  Time spent: 75min  Janathan Bribiesca [facility]  Pager [phone]

## 2015-11-07 DIAGNOSIS — C541 Malignant neoplasm of endometrium: Secondary | ICD-10-CM

## 2015-11-07 DIAGNOSIS — E876 Hypokalemia: Secondary | ICD-10-CM

## 2015-11-07 DIAGNOSIS — N390 Urinary tract infection, site not specified: Secondary | ICD-10-CM

## 2015-11-07 DIAGNOSIS — D509 Iron deficiency anemia, unspecified: Secondary | ICD-10-CM

## 2015-11-07 DIAGNOSIS — K922 Gastrointestinal hemorrhage, unspecified: Secondary | ICD-10-CM

## 2015-11-07 DIAGNOSIS — R509 Fever, unspecified: Secondary | ICD-10-CM

## 2015-11-07 DIAGNOSIS — E46 Unspecified protein-calorie malnutrition: Secondary | ICD-10-CM

## 2015-11-07 DIAGNOSIS — N1330 Unspecified hydronephrosis: Secondary | ICD-10-CM

## 2015-11-07 LAB — COMPREHENSIVE METABOLIC PANEL
ALBUMIN: 2.4 g/dL — AB (ref 3.5–5.0)
ALT: 12 U/L — ABNORMAL LOW (ref 14–54)
ANION GAP: 7 (ref 5–15)
AST: 12 U/L — AB (ref 15–41)
Alkaline Phosphatase: 99 U/L (ref 38–126)
BUN: 10 mg/dL (ref 6–20)
CHLORIDE: 104 mmol/L (ref 101–111)
CO2: 18 mmol/L — ABNORMAL LOW (ref 22–32)
Calcium: 7.3 mg/dL — ABNORMAL LOW (ref 8.9–10.3)
Creatinine, Ser: 1.27 mg/dL — ABNORMAL HIGH (ref 0.44–1.00)
GFR calc Af Amer: 52 mL/min — ABNORMAL LOW (ref >60)
GFR calc non Af Amer: 45 mL/min — ABNORMAL LOW (ref >60)
GLUCOSE: 115 mg/dL — AB (ref 65–99)
POTASSIUM: 3.1 mmol/L — AB (ref 3.5–5.1)
SODIUM: 129 mmol/L — AB (ref 135–145)
Total Bilirubin: 1.3 mg/dL — ABNORMAL HIGH (ref 0.3–1.2)
Total Protein: 5.8 g/dL — ABNORMAL LOW (ref 6.5–8.1)

## 2015-11-07 LAB — CBC
HCT: 23.4 % — ABNORMAL LOW (ref 36.0–46.0)
Hemoglobin: 7.7 g/dL — ABNORMAL LOW (ref 12.0–15.0)
MCH: 28.4 pg (ref 26.0–34.0)
MCHC: 32.9 g/dL (ref 30.0–36.0)
MCV: 86.3 fL (ref 78.0–100.0)
PLATELETS: 329 10*3/uL (ref 150–400)
RBC: 2.71 MIL/uL — ABNORMAL LOW (ref 3.87–5.11)
RDW: 15.6 % — AB (ref 11.5–15.5)
WBC: 39.6 10*3/uL — AB (ref 4.0–10.5)

## 2015-11-07 LAB — PREPARE RBC (CROSSMATCH)

## 2015-11-07 LAB — PROTIME-INR
INR: 1.65 — AB (ref 0.00–1.49)
PROTHROMBIN TIME: 19 s — AB (ref 11.6–15.2)

## 2015-11-07 LAB — APTT: aPTT: 38 seconds — ABNORMAL HIGH (ref 24–37)

## 2015-11-07 MED ORDER — DIPHENOXYLATE-ATROPINE 2.5-0.025 MG PO TABS
1.0000 | ORAL_TABLET | Freq: Two times a day (BID) | ORAL | Status: DC
  Administered 2015-11-07 – 2015-11-11 (×9): 1 via ORAL
  Filled 2015-11-07 (×9): qty 1

## 2015-11-07 MED ORDER — ACETAMINOPHEN 325 MG PO TABS
325.0000 mg | ORAL_TABLET | Freq: Once | ORAL | Status: AC
  Administered 2015-11-07: 325 mg via ORAL
  Filled 2015-11-07: qty 1

## 2015-11-07 MED ORDER — SODIUM CHLORIDE 0.9 % IV SOLN
Freq: Once | INTRAVENOUS | Status: AC
  Administered 2015-11-07: 13:00:00 via INTRAVENOUS

## 2015-11-07 MED ORDER — FERUMOXYTOL INJECTION 510 MG/17 ML
510.0000 mg | Freq: Once | INTRAVENOUS | Status: AC
  Administered 2015-11-08: 510 mg via INTRAVENOUS
  Filled 2015-11-07: qty 17

## 2015-11-07 NOTE — Progress Notes (Signed)
MEDICAL ONCOLOGY November 07, 2015, 9:35 AM  Hospital day 2 Antibiotics: day 2 vanc zosyn Chemotherapy: first doxil 10-30-15. No gCSF. 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 EMR reviewed. Discussed on unit with Dr Broadus John now.  Subjective: Patient is tired, but nothing acutely worse overnight or this AM. She had bladder discomfort and urinary incontinence which began day of admission and is improved this AM. She denies shaking chills. She does not have any significant abdominal pain this AM. 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 Stools are loose, x 2 during night. No nausea or vomiting. On clear liquids, likes [employer]. Less cough, not productive, not SOB lying in bed. No problems with PAC. No LE swelling.    ONCOLOGIC HISTORY Patient had heavy postmenopausal bleeding Sept 2015, then lesser bleeding over next several months until she was seen in 06-2014 by [name]. Hemoglobin was 12.7 on 07-04-14. CT CAP 06-25-14 had uterus 16.4 x 13.5 x 14.4 cm with marked expansion of endometrial canal by heterogeneous mass, bilateral pelvic sidewall adenopathy, iliac adenopathy with no definite retroperitoneal or mesenteric adenopathy, no ascites, no liver mets. Attempted endometrial sampling 06-26-14 and 07-03-14 was nondiagnostic; around that time the patient was passing clots and using one large maxipad hourly. She was seen by [name] 07-05-14, with uterine fundus palpable  above umbilicus. She had surgery by [name] at [facility] on 07-14-14, which was exploratory laparotomy with TAH BSO, bilateral total pelvic lymphadenectomy and sampling of aortic and renal nodes. Pathology [facility] [phone]) found high grade endometrial stromal sarcoma with primary 18 cm, 8/45 nodes involved including 2 right pelvic and 6 left pelvic nodes, ER PR negative. Case was presented at [facility] multidisciplinary conference 07-23-14, with recommendation for PET CT to evaluate inguinal and portahepatis nodes, and to consider adjuvant gemzar taxotere and possibly follow with 4 cycles of adriamycin, then to consider whole pelvic RT. She saw [name] for post op follow up on 07-28-14, with recommendation for gemzar taxotere and adriamycin, whole pelvic RT after chemo and consideration of Megace maintenance after chemo (possibly prior to negative ER PR information). PET 08-01-14 had some uptake in aortocaval and left paraaortic regions. She had day 1 cycle 1 gemzar taxotere on 08-28-14, day 8 cycle 1 on 09-04-14 and neulasta on 09-06-14. Admitted with neutropenic fever on day 14 cycle 1, with oral mucositis and some diarrhea. Counts maintained cycle 2 using OnPro neulasta day 9. She had progressive LE swelling after cycle 3, with venous dopplers negative 10-23-14, then LE swelling and SOB so marked after day 1 cycle 4 10-30-14 that chemo was held. Restaging CT AP + CXR 11-10-14 had stable tiny left pulmonary nodule/ no pleural effusion and normal heart size; CT had necrotic aortocaval adenopathy, iliac adenopathy L>R and 7x10 cm fluid collection in pelvis. She received IMRT 45 gray in 25 fractions to pelvis from 6-27 thru 01-05-15, with resolution of LE swelling by completion of course. CT CAP 02-03-15 showed improvement in retroperitoneal and pelvic involvement. She went onto observation after RT, plan to wait until  clear progressive disease before resuming treatment, possibly adriamycin vs on study. CT CAP 05-07-15 showed new  lytic lesion at T4, stable 2-3 mm pulmonary nodules, increased left pelvic and retroperitoneal nodes and RLQ peritoneal nodule. Radiation therapy summary as follows: Indication for treatment: A new bone lesion in the right side of the T4 vertebral body, with soft tissue extension slightly impinging upon the central spinal canal, some pain from this lesion Radiation treatment dates: 05/18/2015 through 06/05/2015 Site/dose: T4 thoracic spine area, 35 gray in 14 fractions She was not eligible for any of the arms of MATCH trial by evaluation in Jan 2017. Plan was to begin treatment with adriamycin + olaratumab, however this not begun prior to hospitalization 07-30-15 with abdominal/ pelvic abscess. She was hospitalized at [facility] from 2-23 thru 08-27-15 with bowel fistulas and abdominopelvic abscesses. She was transferred to [facility] on 08-27-15 and remained hospitalized there thru ~ 09-28-15, with 2 surgeries for bowel involvement with malignancy. IR drains fell out in 10-2015, with CT AP [facility] 10-22-15 showing progression of extensive intraabdominal disease. 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 Patient requested trying further chemo, first doxil given 10-30-15.   Objective: Vital signs in last 24 hours: Blood pressure 114/69, pulse 111, temperature 101.1 F (38.4 C), temperature source Oral, resp. rate 20, height 5' (1.524 m), weight 190 lb (86.183 kg), last menstrual period 02/04/2014, SpO2 98 %. Awake, alert, fully oriented and appropriate,  looks more comfortable now than when I saw her in office on 11-05-15. Pale, not icteric. PERRL. Oral mucosa moist and clear. Mucous membranes pale.Respirations not labored RA.  Lungs clear anteriorly, no cough during my visit. No JVD. PAC site fine. Heart RRR no gallop. Abdomen soft, a few normal bowel  sounds, not distended, not tender. . Mucous fistula with thin dark red fluid in bag estimated 20 cc now. LE no pitting edema, cords, tenderness. Feet warm. Moves all extremities in bed. Speech fluent and appropriate. No focal deficits. PSYCH appropriate mood and afffect.  Lab Results:  Recent Labs  11/06/15 1134 11/07/15 0354  WBC 40.5* 39.6*  HGB 8.0* 7.7*  HCT 23.8* 23.4*  PLT 304 329   BMET  Recent Labs  11/06/15 1126 11/07/15 0354  NA 132* 129*  K 3.1* 3.1*  CL  --  104  CO2 19* 18*  GLUCOSE 105 115*  BUN 11.4 10  CREATININE 1.2* 1.27*  CALCIUM 8.7 7.3*   C diff negative 6-2 GI panel negative 6-2  UA 11-06-15  TNTC WBC, many bact, 0-5 RBC, culture pending' Blood cultures x 2 done at [facility] on 11-06-15, pending.  Iron studies from 11-05-15: serum iron 14 and %sat 8  Studies/Results: Dg Chest 2 View  11/06/2015  CLINICAL DATA:  Cough and fever, several days duration. Endometrial cancer. EXAM: CHEST  2 VIEW COMPARISON:  08/21/2015 FINDINGS: Power port has its tip in the SVC above the right atrium. Heart size is normal. Mediastinal shadows are normal. The lungs are clear. The vascularity is normal. No effusions. Chronic curvature in degeneration of the spine. IMPRESSION: No active cardiopulmonary disease. Electronically Signed   By: Nelson Chimes M.D.   On: 11/06/2015 14:54    Most recent CT was at [facility] 10-22-15, report in [place] as follows:  CT Abdomen Pelvis W IV and Oral Contrast5/18/2017  [place]  Result Narrative  EXAM: CT abdomen and pelvis with contrast DATE: 10/22/2015 9:28 PM ACCESSION: [id number] DICTATED: 10/22/2015 11:14 PM INTERPRETATION LOCATION: [place]  CLINICAL INDICATION: 60 years old Female with ABDOMINAL PAIN, RIGHT LOWER QUADRANT--    COMPARISON: CTA abdomen and pelvis 09/25/2015  TECHNIQUE: A spiral CT scan was obtained with IV contrast from the lung bases to the pubic symphysis.  Images were reconstructed in the axial plane.  Coronal and sagittal reformatted images were also provided for further evaluation.  FINDINGS:  Mild atelectasis at the lung bases. Mitral annular calcifications.  Hepatomegaly with likely focal fat along the fissure for the ligamentum teres. Curvilinear hypoattenuating focus in the lateral aspect of the left hepatic lobe (3:19), not clearly seen on prior contrast-enhanced CT of 08/22/2015; query perfusional abnormality, small branch vessel thrombosis, or focal biliary dilation. Additional scattered foci of hypoattenuation in the liver too small to characterize.  Splenomegaly, measures 13.9 cm.  Pancreas and adrenals are unremarkable.  Malrotated right kidney with mild hydronephrosis, slightly increased since prior. Moderate left hydronephrosis, increased since prior. Delayed left nephrogram.  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 Right lower quadrant ostomy with fat-containing parastomal hernia into which colon protrudes. Interval removal of right lower quadrant percutaneous drain. Heterogeneously enhancing, multilobulated mass in the right lower quadrant abutting the cecum measures approximately 9.6 x 15.7 x 8.3 cm, previously 7.7 x 10.5 x 8.2 cm (3:69). The mass appears to extend into the tract left by the previous right lower quadrant percutaneous drain (3:70) and obliterates the fat plane with the right iliopsoas muscle (3:71). Contrast opacifies the rectum, ascending, and transverse colon up to the resection site without extravasation. Additional retroperitoneal mass or conglomerate adenopathy which may involve small bowel in the midabdomen measures up to 4.8 cm AP (3:52), previously 4.3 cm. This lesion abuts and has mass effect on the anterior aspect of the aorta just above the iliac bifurcation. A third mass abutting small bowel in the left hemiabdomen measuring up to 4.4 cm (5:47) may be present, versus fluid-filled bowel; it  is difficult to evaluate due to lack of oral contrast at this level. No evidence of bowel obstruction.      Multiple additional abdominal/pelvic masses/implants including:  left pelvic sidewall, 1.6 cm (3:72), previously 1.2 cm; left pelvic sidewall, 3.3 cm (3:75), previously 3.2 cm; right pelvic sidewall (2.5 cm, 3:80), previously 2.5 cm; overall appearance is stable to slightly increased since the previous study. Subcentimeter implant in the left anterior pelvis (3:68), unchanged.   There is mild thickening of the superior bladder wall which close abuts abdominal masses (5:36). Uterus is absent. Ovaries not clearly delineated.  Multiple additional soft tissue implants identified: -Multilobulated lesion abutting the left psoas and encasing the left common and external iliac arteries measuring 5.1 x 2.4 x 8.4 cm, previously measuring 5.2 x 2.4 x 7.5 cm (3:60). -Right pelvic sidewall lesion measuring 2.5 x 2.4 cm, previously 2.4 x 2.2 cm (3:80). -Left pelvic sidewall/UVJ lesion measuring 3.3 x 2.6 cm, previously 3.2 x 2.4 cm (3:75).  Likely mass effect on iliac veins bilaterally, right greater than left.  Fat stranding within the mesentery.  Midline fat stranding; query small volume soft tissue at the midline in the subcutaneous tissues as on prior.  No worrisome osseous lesions.   IMPRESSION:  Increased size of right lower quadrant mass involving bowel with likely extension into the percutaneous drain tract.  Stable to slightly increased size of multiple additional intra-abdominal disease implants  with interval development of mild right hydronephrosis and moderate left hydronephrosis with mildly delayed left nephrogram.  Mild superior bladder wall thickening adjacent to the right lower quadrant mass. May represent disease involvement and/or.  Fat stranding in the mesentery, similar to prior.   Curvilinear hypoattenuating focus in the peripheral left hepatic lobe; query perfusional  abnormality, small branch vessel thrombosis, or focal biliary dilation; attention on follow up.   DISCUSSION Interval history reviewed.     Assessment/Plan: 1. Metastatic endometrial stromal sarcoma extensive in pelvis and involving T4, also tiny pulmonary nodules unclear significance (CT chest 07-2015), progressive on gemzar taxotere and post radiation to T4. No treatment available on MATCH trial thru [facility]. Plan had been for adriamycin + olaratumab in Feb, not begun due to interval bowel perforation(s) with abdominal and pelvic abscesses. Now post surgeries x 2 with entero- enterostomy and mucous fistula. She initially improved from very complicated 2 month hospitalization,and at her request was given doxil (dose reduced) on 10-30-15 in palliative attempt.  Had CT AP at [facility] 10-22-15 as above. IR drains at [facility] were not successful. Overall prognosis obviously very poor, doxil in palliative attempt, chosen particularly as generally easy to tolerate. She has not wanted to stop all treatment yet, tho Hospice has been discussed. Palliative Care follow up in hospital would be useful.   2.Fever in context of complicated medical problems as above: probable UTI, cultures pending. Febrile again to 101 this AM. Agree with present broad spectrum antibiotics for now. Agree with using information from recent [facility] CT if obvious source otherwise.  Recent information: completed 14 day course of levaquin and flagyl prior to 10-30-15 Had E coli in pelvic abscesses in 07-2015, GPC in 1/2 blood cultures 08-24-15, MSSA in blood cx at [facility].  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 Bleeding is significant and it would be helpful if any way to improve this. Coags sent. She has not seen GI; I would like to ask if GI would consider endoscopic visualization of mucous fistula  in case isolated area of bleeding could be locally addressed. Not emergent now, ok to wait for GI consult until Mon if  needed 4. Severe iron deficiency anemia, hgb lower with ongoing blood loss and rehydration. Would transfuse 2 units PRBCs today or tomorrow depending on fever.  I have ordered feraheme.  5. protein calorie malnutrition: now oral intake only, likes [employer]ree[employer].  6.degenerative arthritis knees very symptomatic prior to all of recent complications 7. DNR confirmed with patient by this MD on 11-07-15.  advance directives in place 8.social situation: lives alone, equidistant [place] and [place]. Caregiver arranged for several hours daily, [facility] and PT involved prior to this admission, set up by [facility] after surgery. Still very deconditioned. 9.mild-moderate bilateral hydronephrosis by [facility] CT, creatinine stable today.  10.hx psoriasis and nonmelanoma skin ca. Hx morbid obesity and previous gastric banding 11. PAC fine for IVs/ blood draws 12.oral thrush: resolved with diflucan 13. Hypokalemia: K 3.1 this AM and other labs noted. Primary service addressing  I will follow with you. Please page me if needed between my rounds [phone], or med onc on call. Kadra Kohan P

## 2015-11-07 NOTE — Progress Notes (Signed)
PROGRESS NOTE    Erin Santiago  [id number] DOB: [birth year] DOA: 11/06/2015 PCP: Merrilee Seashore, MD  Outpatient Specialists:Dr.Livesay, Dr.Soper at [facility] Brief Narrative: Erin Santiago is a 60 y.o. 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 She just started palliative chemotherapy after 4 month hiatus, last Friday, she went to the [place] for blood transfusion on 6/2 and was found to be febrile up to 101, and tachycardic hence she was sent to the emergency room for further evaluation and management.  Assessment & Plan:   1. Fever/SIRS -UA grossly abnormal, suggestive of UTI with positive nitrite, leukocyte esterase trace, numerous WBC and bacteria -It is possible that she has an intra-abdominal process/infection that is brewing, however I suspect her options will be significantly limited due to the advanced nature of her underlying cancer and disease progression, and h/o abd surgeries/drains. -Will defer CT abdomen pelvis at this time, d/w Dr.Livesay -last CT 5/18 at [facility] with progression of intra Abd disease, and some hydronephrosis  2. Widely metastatic endometrial sarcoma with progression of disease in the abdomen/pelvis, metastases to the spine, possibly long just started palliative chemotherapy last week -Severely limited in terms of available options given the complexity of her disease -Hospice was discussed last month,  patient elected for palliative chemotherapy instead -d/w Dr.Livesay, DNR now  3. Leukocytosis -Baseline WBC count in the 25-30,000 range -40.5 K on admission, with fever, now 39K this am -Low threshold to scan her abdomen however options significantly limited, last CT 5/18 at [facility] with progression of intra Abd disease, and some hydronephrosis  4. Anemia -Due to chronic disease, malignancy, chemotherapy -Ongoing chronic blood loss related to her small bowel fistula -will need PRBC in 24-48H after fevers better -Dr.Livesay requests GI input to see if scoping a stoma would be a option, due to ongoing bloody drainage chronically, will ask for GI input on Monday  5. Mild to moderate hydronephrosis noted on last CT abdomen pelvis in [place] on 5/18 -Making good amounts of urine, with her making good amounts of urine at this time doubt intervention/stenting would be required at this time  6. Mild Aki -Baseline creatinine close to 1 now it is 1.2 -Hydrate, monitor  7. Severe protein calorie malnutrition -Related to progressive malignancy and intra-abdominal complications   Code Status: DNR now DVT Prophylaxis:lovenox Family Communication:No family at bedside Disposition Plan:  to be determined, home vs SNF   Consultants:  Dr.Livesay    Antimicrobials: Vanc Zosyn 6/2->   Subjective: Feels a little better, continues to have bloody drainage from stoma  Objective: Filed Vitals:   11/06/15 1931 11/06/15 2252 11/07/15 0617 11/07/15 0949  BP:  145/65 114/69   Pulse:  73 111   Temp: 98.7 F (37.1 C) 97.9 F (36.6 C) 101.1 F (38.4 C) 98.2 F (36.8 C)  TempSrc:  Oral Oral Oral  Resp:  20 20   Height:      Weight:      SpO2:  100% 98%    No intake or  output data in the 24 hours ending 11/07/15 1001 Filed Weights   11/06/15 1321 11/06/15 1717  Weight: 85.73 kg (189 lb) 86.183 kg (190 lb)    Examination:  General: Appears calm and comfortable, chronically  ill-appearing obese,   Eyes: PERRLpallor noted  NT: grossly normal hearing, dry lips & tongue  Neck: no LAD, masses or thyromegaly  Cardiovascular: RRR, no m/r/g. 1+LE edema.  Telemetry: SR, no arrhythmias   Respiratory: CTA bilaterally, no w/r/r. Normal respiratory effort.  Abdomen: softobese, nontender, nondistended, mucocutaneous fistula with blood-tinged drainage  Skin: no rash or induration seen on limited exam  Musculoskeletal: grossly normal tone BUE/BLE  PsychiatricFlat affect, avoids eye contact  Neurologic : grossly non-focal, very deconditioned     Data Reviewed: I have personally reviewed following labs and imaging studies  CBC:  Recent Labs Lab 11/05/15 1202 11/06/15 1134 11/07/15 0354  WBC 48.8* 40.5* 39.6*  NEUTROABS 42.2* 34.3*  --   HGB 8.3* 8.0* 7.7*  HCT 25.6* 23.8* 23.4*  MCV 86.4 85.3 86.3  PLT 437* 304 [id number]   Basic Metabolic Panel:  Recent Labs Lab 11/05/15 1202 11/06/15 1126 11/07/15 0354  NA 132* 132* 129*  K 2.8* 3.1* 3.1*  CL  --   --  104  CO2 22 19* 18*  GLUCOSE 131 105 115*  BUN 11.4 11.4 10  CREATININE 1.3* 1.2* 1.27*  CALCIUM 8.8 8.7 7.3*   GFR: Estimated Creatinine Clearance: 46 mL/min (by C-G formula based on Cr of 1.27). Liver Function Tests:  Recent Labs Lab 11/05/15 1202 11/06/15 1126 11/07/15 0354  AST 11 9 12*  ALT 9 9 12*  ALKPHOS 142 126 99  BILITOT 1.23* 1.29* 1.3*  PROT 6.8 6.6 5.8*  ALBUMIN 2.6* 2.5* 2.4*   No results for input(s): LIPASE, AMYLASE in the last 168 hours. No results for input(s): AMMONIA in the last 168 hours. Coagulation Profile: No results for input(s): INR, PROTIME in the last 168 hours. Cardiac Enzymes: No results for input(s): CKTOTAL, CKMB, CKMBINDEX, TROPONINI in the last 168 hours. BNP (last 3 results) No results for input(s): PROBNP in the last 8760 hours. HbA1C: No results for input(s): HGBA1C in the last 72 hours. CBG: No results for input(s): GLUCAP in the  last 168 hours. Lipid Profile: No results for input(s): CHOL, HDL, LDLCALC, TRIG, CHOLHDL, LDLDIRECT in the last 72 hours. Thyroid Function Tests: No results for input(s): TSH, T4TOTAL, FREET4, T3FREE, THYROIDAB in the last 72 hours. Anemia Panel:  Recent Labs  11/05/15 1202  TIBC 173*  IRON 14*   Urine analysis:    Component Value Date/Time   COLORURINE AMBER* 11/06/2015 1454   APPEARANCEUR CLOUDY* 11/06/2015 1454   LABSPEC 1.019 11/06/2015 1454   LABSPEC 1.010 12/30/2014 1524   PHURINE 6.0 11/06/2015 1454   PHURINE 7.5 12/30/2014 1524   GLUCOSEU NEGATIVE 11/06/2015 1454   GLUCOSEU Negative 12/30/2014 1524   HGBUR NEGATIVE 11/06/2015 1454   HGBUR Negative 12/30/2014 1524   BILIRUBINUR NEGATIVE 11/06/2015 1454   BILIRUBINUR Negative 12/30/2014 1524   KETONESUR NEGATIVE 11/06/2015 1454   KETONESUR Negative 12/30/2014 1524   PROTEINUR 100* 11/06/2015 1454   PROTEINUR Negative 12/30/2014 1524   UROBILINOGEN 0.2 12/30/2014 1524   UROBILINOGEN 0.2 09/11/2014 0335   NITRITE POSITIVE* 11/06/2015 1454   NITRITE Negative 12/30/2014 1524   LEUKOCYTESUR SMALL* 11/06/2015 1454   LEUKOCYTESUR Moderate 12/30/2014 1524   Sepsis Labs: @LABRCNTIP (procalcitonin:4,lacticidven:4)  ) Recent Results (from the past 240 hour(s))  TECHNOLOGIST REVIEW     Status:  None   Collection Time: 10/30/15  9:22 AM  Result Value Ref Range Status   Technologist Review Sl toxic granulation, dohle bodies, Mod vacuoles  Final  TECHNOLOGIST REVIEW     Status: None   Collection Time: 11/06/15 11:34 AM  Result Value Ref Range Status   Technologist Review   Final    Sl toxic granulation, Occ vacuoles and dohle bodies  C difficile quick scan w PCR reflex     Status: None   Collection Time: 11/06/15  4:19 PM  Result Value Ref Range Status   C Diff antigen NEGATIVE NEGATIVE Final   C Diff toxin NEGATIVE NEGATIVE Final   C Diff interpretation Negative for toxigenic C. difficile  Final  Gastrointestinal  Panel by PCR , Stool     Status: None   Collection Time: 11/06/15  4:19 PM  Result Value Ref Range Status   Campylobacter species NOT DETECTED NOT DETECTED Final   Plesimonas shigelloides NOT DETECTED NOT DETECTED Final   Salmonella species NOT DETECTED NOT DETECTED Final   Yersinia enterocolitica NOT DETECTED NOT DETECTED Final   Vibrio species NOT DETECTED NOT DETECTED Final   Vibrio cholerae NOT DETECTED NOT DETECTED Final   Enteroaggregative E coli (EAEC) NOT DETECTED NOT DETECTED Final   Enteropathogenic E coli (EPEC) NOT DETECTED NOT DETECTED Final   Enterotoxigenic E coli (ETEC) NOT DETECTED NOT DETECTED Final   Shiga like toxin producing E coli (STEC) NOT DETECTED NOT DETECTED Final   E. coli O157 NOT DETECTED NOT DETECTED Final   Shigella/Enteroinvasive E coli (EIEC) NOT DETECTED NOT DETECTED Final   Cryptosporidium NOT DETECTED NOT DETECTED Final   Cyclospora cayetanensis NOT DETECTED NOT DETECTED Final   Entamoeba histolytica NOT DETECTED NOT DETECTED Final   Giardia lamblia NOT DETECTED NOT DETECTED Final   Adenovirus F40/41 NOT DETECTED NOT DETECTED Final   Astrovirus NOT DETECTED NOT DETECTED Final   Norovirus GI/GII NOT DETECTED NOT DETECTED Final   Rotavirus A NOT DETECTED NOT DETECTED Final   Sapovirus (I, II, IV, and V) NOT DETECTED NOT DETECTED Final         Radiology Studies: Dg Chest 2 View  11/06/2015  CLINICAL DATA:  Cough and fever, several days duration. Endometrial cancer. EXAM: CHEST  2 VIEW COMPARISON:  08/21/2015 FINDINGS: Power port has its tip in the SVC above the right atrium. Heart size is normal. Mediastinal shadows are normal. The lungs are clear. The vascularity is normal. No effusions. Chronic curvature in degeneration of the spine. IMPRESSION: No active cardiopulmonary disease. Electronically Signed   By: Nelson Chimes M.D.   On: 11/06/2015 14:54        Scheduled Meds: . sodium chloride   Intravenous Once  . acetaminophen  325 mg Oral Once    . cholestyramine  1 packet Oral Q12H  . diphenoxylate-atropine  1 tablet Oral BID  . enoxaparin (LOVENOX) injection  40 mg Subcutaneous Q24H  . [START ON 11/08/2015] ferumoxytol  510 mg Intravenous Once  . pantoprazole  40 mg Oral Daily  . piperacillin-tazobactam (ZOSYN)  IV  3.375 g Intravenous Q8H  . potassium chloride  20 mEq Oral BID  . vancomycin  750 mg Intravenous Q12H   Continuous Infusions:    LOS: 1 day    Time spent: 38min    Domenic Polite, MD [facility] Pager [phone]  If 7PM-7AM, please contact night-coverage [email or web address] Password [email or web address] 11/07/2015, 10:01 AM

## 2015-11-07 NOTE — Progress Notes (Signed)
Dr Broadus John paged concerning temp of 102.6 at end of blood transfusion. Will continue to monitor.

## 2015-11-08 LAB — CBC
HCT: 26.4 % — ABNORMAL LOW (ref 36.0–46.0)
Hemoglobin: 8.9 g/dL — ABNORMAL LOW (ref 12.0–15.0)
MCH: 28.3 pg (ref 26.0–34.0)
MCHC: 33.7 g/dL (ref 30.0–36.0)
MCV: 83.8 fL (ref 78.0–100.0)
PLATELETS: 292 10*3/uL (ref 150–400)
RBC: 3.15 MIL/uL — AB (ref 3.87–5.11)
RDW: 15.4 % (ref 11.5–15.5)
WBC: 30.5 10*3/uL — ABNORMAL HIGH (ref 4.0–10.5)

## 2015-11-08 LAB — BASIC METABOLIC PANEL
Anion gap: 9 (ref 5–15)
BUN: 10 mg/dL (ref 6–20)
CHLORIDE: 108 mmol/L (ref 101–111)
CO2: 17 mmol/L — ABNORMAL LOW (ref 22–32)
Calcium: 7.5 mg/dL — ABNORMAL LOW (ref 8.9–10.3)
Creatinine, Ser: 1.32 mg/dL — ABNORMAL HIGH (ref 0.44–1.00)
GFR calc Af Amer: 50 mL/min — ABNORMAL LOW (ref >60)
GFR calc non Af Amer: 43 mL/min — ABNORMAL LOW (ref >60)
GLUCOSE: 130 mg/dL — AB (ref 65–99)
POTASSIUM: 2.8 mmol/L — AB (ref 3.5–5.1)
Sodium: 134 mmol/L — ABNORMAL LOW (ref 135–145)

## 2015-11-08 LAB — URINE CULTURE

## 2015-11-08 MED ORDER — BOOST / RESOURCE BREEZE PO LIQD
1.0000 | Freq: Three times a day (TID) | ORAL | Status: DC
  Administered 2015-11-08 – 2015-11-11 (×7): 1 via ORAL

## 2015-11-08 MED ORDER — POTASSIUM CHLORIDE ER 10 MEQ PO TBCR
40.0000 meq | EXTENDED_RELEASE_TABLET | ORAL | Status: AC
  Administered 2015-11-08 (×3): 40 meq via ORAL
  Filled 2015-11-08 (×3): qty 4

## 2015-11-08 MED ORDER — POTASSIUM CHLORIDE ER 10 MEQ PO TBCR
40.0000 meq | EXTENDED_RELEASE_TABLET | Freq: Two times a day (BID) | ORAL | Status: DC
  Filled 2015-11-08 (×2): qty 4

## 2015-11-08 NOTE — Progress Notes (Signed)
PROGRESS NOTE    Erin Santiago  [id number] DOB: 05/30/1956 DOA: 11/06/2015 PCP: Merrilee Seashore, MD  Outpatient Specialists:Dr.Livesay, Dr.Soper at [facility] Brief Narrative: Erin Santiago is a 60 y.o. 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 She just started palliative chemotherapy after 4 month hiatus, last Friday, she went to the [place] for blood transfusion on 6/2 and was found to be febrile up to 101, and tachycardic hence she was sent to the emergency room for further evaluation and management.  Assessment & Plan:   1. Fever/SIRS -U Cx with >100K EColi, Blood cultures drawn at Cancer center -will request results tomorrow -It is also possible that she has an intra-abdominal process/infection that is brewing, however I suspect her options will be significantly limited due to the advanced nature of her underlying cancer and disease progression, and h/o abd surgeries/drains. -Hold off CT abdomen pelvis at this time, due to lack of any abd symptoms, d/w Dr.Livesay -last CT 5/18 at [facility] with progression of intra Abd disease, and some hydronephrosis -Stop Vancomycin, continue Zosyn -fevers improving  2. Widely metastatic endometrial sarcoma with progression of disease in the abdomen/pelvis, metastases to the spine, possibly long just started palliative chemotherapy last week -Severely limited in terms of available options given the  complexity of her disease -Hospice was discussed last month, patient elected for palliative chemotherapy instead -d/w Dr.Livesay, DNR now  3. Leukocytosis -Baseline WBC count in the 25-30,000 range -40.5 K on admission, with fever, >39K>30K -last CT 5/18 at [facility] with progression of intra Abd disease, and some hydronephrosis  4. Anemia -Due to chronic disease, malignancy, chemotherapy -Ongoing chronic blood loss related to her small bowel fistula -s/p 2units PRBC 6/3 -Dr.Livesay requests GI input to see if scoping a stoma would be a option, due to ongoing bloody drainage chronically, will ask for GI input on Monday   5. Mild to moderate hydronephrosis noted on last CT abdomen pelvis in [place] on 5/18 -Making good amounts of urine, with her making good amounts of urine at this time doubt intervention/stenting would be required at this time  6. Mild Aki -Baseline creatinine close to 1 now it is 1.2 -Hydrate, monitor  7. Severe protein calorie malnutrition -Related to progressive malignancy and intra-abdominal complications   Code Status: DNR now DVT Prophylaxis:lovenox Family Communication:No family at bedside Disposition Plan:  to be determined, home vs SNF   Consultants:  Dr.Livesay    Antimicrobials: Vanc Zosyn 6/2->   Subjective: Feels better, continues to have bloody drainage from stoma -no issues overnight  Objective: Filed Vitals:   11/07/15 1830 11/07/15 2006 11/07/15 2228 11/08/15 0458  BP: 130/63 109/67  125/60  Pulse: 100 120  90  Temp: 102.3 F (39.1 C) 101.8 F (38.8 C) 98.6 F (37 C) 98.9 F (37.2 C)  TempSrc: Oral Oral Oral Oral  Resp: 18 20  20   Height:      Weight:      SpO2: 100% 97%  100%    Intake/Output Summary (Last 24  hours) at 11/08/15 0734 Last data filed at 11/07/15 2231  Gross per 24 hour  Intake  777.5 ml  Output    142 ml  Net  635.5 ml   Filed Weights   11/06/15 1321 11/06/15 1717  Weight: 85.73 kg (189 lb) 86.183 kg  (190 lb)    Examination:  General: Appears calm and comfortable, chronically ill-appearing obese,   Eyes: PERRLpallor noted  NT: grossly normal hearing, dry lips & tongue  Neck: no LAD, masses or thyromegaly  Cardiovascular: RRR, no m/r/g. 1+LE edema.  Telemetry: SR, no arrhythmias   Respiratory: CTA bilaterally, no w/r/r. Normal respiratory effort.  Abdomen: softobese, nontender, nondistended, mucocutaneous fistula with blood-tinged drainage  Skin: no rash or induration seen on limited exam  Musculoskeletal: grossly normal tone BUE/BLE  PsychiatricFlat affect, avoids eye contact  Neurologic : grossly non-focal, very deconditioned     Data Reviewed: I have personally reviewed following labs and imaging studies  CBC:  Recent Labs Lab 11/05/15 1202 11/06/15 1134 11/07/15 0354 11/08/15 0500  WBC 48.8* 40.5* 39.6* 30.5*  NEUTROABS 42.2* 34.3*  --   --   HGB 8.3* 8.0* 7.7* 8.9*  HCT 25.6* 23.8* 23.4* 26.4*  MCV 86.4 85.3 86.3 83.8  PLT 437* 304 329 [id number]   Basic Metabolic Panel:  Recent Labs Lab 11/05/15 1202 11/06/15 1126 11/07/15 0354 11/08/15 0500  NA 132* 132* 129* 134*  K 2.8* 3.1* 3.1* 2.8*  CL  --   --  104 108  CO2 22 19* 18* 17*  GLUCOSE 131 105 115* 130*  BUN 11.4 11.4 10 10   CREATININE 1.3* 1.2* 1.27* 1.32*  CALCIUM 8.8 8.7 7.3* 7.5*   GFR: Estimated Creatinine Clearance: 44.2 mL/min (by C-G formula based on Cr of 1.32). Liver Function Tests:  Recent Labs Lab 11/05/15 1202 11/06/15 1126 11/07/15 0354  AST 11 9 12*  ALT 9 9 12*  ALKPHOS 142 126 99  BILITOT 1.23* 1.29* 1.3*  PROT 6.8 6.6 5.8*  ALBUMIN 2.6* 2.5* 2.4*   No results for input(s): LIPASE, AMYLASE in the last 168 hours. No results for input(s): AMMONIA in the last 168 hours. Coagulation Profile:  Recent Labs Lab 11/07/15 0943  INR 1.65*   Cardiac Enzymes: No results for input(s): CKTOTAL, CKMB, CKMBINDEX, TROPONINI in the last 168 hours. BNP (last 3  results) No results for input(s): PROBNP in the last 8760 hours. HbA1C: No results for input(s): HGBA1C in the last 72 hours. CBG: No results for input(s): GLUCAP in the last 168 hours. Lipid Profile: No results for input(s): CHOL, HDL, LDLCALC, TRIG, CHOLHDL, LDLDIRECT in the last 72 hours. Thyroid Function Tests: No results for input(s): TSH, T4TOTAL, FREET4, T3FREE, THYROIDAB in the last 72 hours. Anemia Panel:  Recent Labs  11/05/15 1202  TIBC 173*  IRON 14*   Urine analysis:    Component Value Date/Time   COLORURINE AMBER* 11/06/2015 1454   APPEARANCEUR CLOUDY* 11/06/2015 1454   LABSPEC 1.019 11/06/2015 1454   LABSPEC 1.010 12/30/2014 1524   PHURINE 6.0 11/06/2015 1454   PHURINE 7.5 12/30/2014 1524   GLUCOSEU NEGATIVE 11/06/2015 1454   GLUCOSEU Negative 12/30/2014 1524   HGBUR NEGATIVE 11/06/2015 1454   HGBUR Negative 12/30/2014 1524   BILIRUBINUR NEGATIVE 11/06/2015 1454   BILIRUBINUR Negative 12/30/2014 1524   KETONESUR NEGATIVE 11/06/2015 1454   KETONESUR Negative 12/30/2014 1524   PROTEINUR 100* 11/06/2015 1454   PROTEINUR Negative 12/30/2014 1524   UROBILINOGEN 0.2 12/30/2014 1524   UROBILINOGEN 0.2 09/11/2014 0335  NITRITE POSITIVE* 11/06/2015 1454   NITRITE Negative 12/30/2014 1524   LEUKOCYTESUR SMALL* 11/06/2015 1454   LEUKOCYTESUR Moderate 12/30/2014 1524   Sepsis Labs: @LABRCNTIP (procalcitonin:4,lacticidven:4)  ) Recent Results (from the past 240 hour(s))  TECHNOLOGIST REVIEW     Status: None   Collection Time: 10/30/15  9:22 AM  Result Value Ref Range Status   Technologist Review Sl toxic granulation, dohle bodies, Mod vacuoles  Final  Culture, Blood     Status: None (Preliminary result)   Collection Time: 11/06/15 11:27 AM  Result Value Ref Range Status   BLOOD CULTURE, ROUTINE Preliminary report  Preliminary   RESULT 1 Comment  Preliminary    Comment: No growth detected at this time.  TECHNOLOGIST REVIEW     Status: None   Collection  Time: 11/06/15 11:34 AM  Result Value Ref Range Status   Technologist Review   Final    Sl toxic granulation, Occ vacuoles and dohle bodies  Culture, Blood     Status: None (Preliminary result)   Collection Time: 11/06/15 11:40 AM  Result Value Ref Range Status   BLOOD CULTURE, ROUTINE Preliminary report  Preliminary   RESULT 1 Comment  Preliminary    Comment: No growth detected at this time.  Urine culture     Status: Abnormal (Preliminary result)   Collection Time: 11/06/15  2:54 PM  Result Value Ref Range Status   Specimen Description URINE, CLEAN CATCH  Final   Special Requests NONE  Final   Culture >=100,000 COLONIES/mL ESCHERICHIA COLI (A)  Final   Report Status PENDING  Incomplete  C difficile quick scan w PCR reflex     Status: None   Collection Time: 11/06/15  4:19 PM  Result Value Ref Range Status   C Diff antigen NEGATIVE NEGATIVE Final   C Diff toxin NEGATIVE NEGATIVE Final   C Diff interpretation Negative for toxigenic C. difficile  Final  Gastrointestinal Panel by PCR , Stool     Status: None   Collection Time: 11/06/15  4:19 PM  Result Value Ref Range Status   Campylobacter species NOT DETECTED NOT DETECTED Final   Plesimonas shigelloides NOT DETECTED NOT DETECTED Final   Salmonella species NOT DETECTED NOT DETECTED Final   Yersinia enterocolitica NOT DETECTED NOT DETECTED Final   Vibrio species NOT DETECTED NOT DETECTED Final   Vibrio cholerae NOT DETECTED NOT DETECTED Final   Enteroaggregative E coli (EAEC) NOT DETECTED NOT DETECTED Final   Enteropathogenic E coli (EPEC) NOT DETECTED NOT DETECTED Final   Enterotoxigenic E coli (ETEC) NOT DETECTED NOT DETECTED Final   Shiga like toxin producing E coli (STEC) NOT DETECTED NOT DETECTED Final   E. coli O157 NOT DETECTED NOT DETECTED Final   Shigella/Enteroinvasive E coli (EIEC) NOT DETECTED NOT DETECTED Final   Cryptosporidium NOT DETECTED NOT DETECTED Final   Cyclospora cayetanensis NOT DETECTED NOT DETECTED Final    Entamoeba histolytica NOT DETECTED NOT DETECTED Final   Giardia lamblia NOT DETECTED NOT DETECTED Final   Adenovirus F40/41 NOT DETECTED NOT DETECTED Final   Astrovirus NOT DETECTED NOT DETECTED Final   Norovirus GI/GII NOT DETECTED NOT DETECTED Final   Rotavirus A NOT DETECTED NOT DETECTED Final   Sapovirus (I, II, IV, and V) NOT DETECTED NOT DETECTED Final         Radiology Studies: Dg Chest 2 View  11/06/2015  CLINICAL DATA:  Cough and fever, several days duration. Endometrial cancer. EXAM: CHEST  2 VIEW COMPARISON:  08/21/2015 FINDINGS: Power port has  its tip in the SVC above the right atrium. Heart size is normal. Mediastinal shadows are normal. The lungs are clear. The vascularity is normal. No effusions. Chronic curvature in degeneration of the spine. IMPRESSION: No active cardiopulmonary disease. Electronically Signed   By: Nelson Chimes M.D.   On: 11/06/2015 14:54        Scheduled Meds: . cholestyramine  1 packet Oral Q12H  . diphenoxylate-atropine  1 tablet Oral BID  . enoxaparin (LOVENOX) injection  40 mg Subcutaneous Q24H  . ferumoxytol  510 mg Intravenous Once  . pantoprazole  40 mg Oral Daily  . piperacillin-tazobactam (ZOSYN)  IV  3.375 g Intravenous Q8H  . [START ON 11/09/2015] potassium chloride  40 mEq Oral BID  . potassium chloride  40 mEq Oral Q2H   Continuous Infusions:    LOS: 2 days    Time spent: 10min    Domenic Polite, MD [facility] Pager [phone]  If 7PM-7AM, please contact night-coverage [email or web address] Password [email or web address] 11/08/2015, 7:34 AM

## 2015-11-08 NOTE — Progress Notes (Signed)
Initial Nutrition Assessment  DOCUMENTATION CODES:   Severe malnutrition in context of chronic illness, Obesity unspecified  INTERVENTION:   Continue Boost Breeze po TID, each supplement provides 250 kcal and 9 grams of protein Encourage small frequent meals RD to continue to monitor  NUTRITION DIAGNOSIS:   Increased nutrient needs related to cancer and cancer related treatments as evidenced by estimated needs.  GOAL:   Patient will meet greater than or equal to 90% of their needs  MONITOR:   PO intake, Supplement acceptance, Labs, Weight trends, I & O's  REASON FOR ASSESSMENT:   Malnutrition Screening Tool    ASSESSMENT:   60 y.o. 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 Pt states she continues to follow her vitamin regimen at home but declined vitamin supplements during this admission. Pt has had weight loss but this is not related to her surgery. Pt denies any swallowing or chewing issues. Pt has been receiving palliative chemotherapy. The patient reports receiving TPN for 2 months, she states TPN was stopped in mid-April. Ever since the patient has been off nutrition support, her appetite has been very poor. She states she has been subsisting on Boost Breeze supplements at home. Boost Gwyneth Revels has been ordered. Pt is not interested in any other supplements or snacks at this time. Will monitor for PO intake of soft diet. Pt has developed thrush per report. Encouraged use of mouth rinses.  Nutrition focused physical exam shows no sign of depletion of muscle mass or body fat. Per weight history, pt has lost 21 lb since 4/27 (10% wt loss x 1 month, significant for time frame).  Medications: K-DUR BID Labs reviewed: Low Na, K  Diet Order:  DIET SOFT Room service appropriate?: Yes;  Fluid consistency:: Thin  Skin:  Reviewed, no issues  Last BM:  6/4  Height:   Ht Readings from Last 1 Encounters:  11/06/15 5' (1.524 m)    Weight:   Wt Readings from Last 1 Encounters:  11/06/15 190 lb (86.183 kg)    Ideal Body Weight:  45.5 kg  BMI:  Body mass index is 37.11 kg/(m^2).  Estimated Nutritional Needs:   Kcal:  1700-1900  Protein:  70-80g  Fluid:  1.9L/day  EDUCATION NEEDS:   No education needs identified at this time  Clayton Bibles, MS, RD, LDN Pager: [phone] After Hours Pager: [phone]

## 2015-11-09 DIAGNOSIS — A498 Other bacterial infections of unspecified site: Secondary | ICD-10-CM

## 2015-11-09 DIAGNOSIS — E43 Unspecified severe protein-calorie malnutrition: Secondary | ICD-10-CM | POA: Insufficient documentation

## 2015-11-09 LAB — COMPREHENSIVE METABOLIC PANEL
ALK PHOS: 84 U/L (ref 38–126)
ALT: 8 U/L — ABNORMAL LOW (ref 14–54)
ANION GAP: 9 (ref 5–15)
AST: 8 U/L — ABNORMAL LOW (ref 15–41)
Albumin: 2.2 g/dL — ABNORMAL LOW (ref 3.5–5.0)
BILIRUBIN TOTAL: 1 mg/dL (ref 0.3–1.2)
BUN: 8 mg/dL (ref 6–20)
CALCIUM: 7.7 mg/dL — AB (ref 8.9–10.3)
CO2: 18 mmol/L — ABNORMAL LOW (ref 22–32)
Chloride: 108 mmol/L (ref 101–111)
Creatinine, Ser: 1.2 mg/dL — ABNORMAL HIGH (ref 0.44–1.00)
GFR, EST AFRICAN AMERICAN: 56 mL/min — AB (ref >60)
GFR, EST NON AFRICAN AMERICAN: 48 mL/min — AB (ref >60)
GLUCOSE: 93 mg/dL (ref 65–99)
POTASSIUM: 2.8 mmol/L — AB (ref 3.5–5.1)
Sodium: 135 mmol/L (ref 135–145)
TOTAL PROTEIN: 5.3 g/dL — AB (ref 6.5–8.1)

## 2015-11-09 LAB — TYPE AND SCREEN
ABO/RH(D): O POS
Antibody Screen: NEGATIVE
UNIT DIVISION: 0
Unit division: 0

## 2015-11-09 LAB — CBC
HEMATOCRIT: 25.5 % — AB (ref 36.0–46.0)
HEMOGLOBIN: 8.4 g/dL — AB (ref 12.0–15.0)
MCH: 28.4 pg (ref 26.0–34.0)
MCHC: 32.9 g/dL (ref 30.0–36.0)
MCV: 86.1 fL (ref 78.0–100.0)
Platelets: 351 10*3/uL (ref 150–400)
RBC: 2.96 MIL/uL — ABNORMAL LOW (ref 3.87–5.11)
RDW: 15.8 % — AB (ref 11.5–15.5)
WBC: 26.2 10*3/uL — ABNORMAL HIGH (ref 4.0–10.5)

## 2015-11-09 MED ORDER — PHYTONADIONE 5 MG PO TABS
10.0000 mg | ORAL_TABLET | Freq: Once | ORAL | Status: AC
  Administered 2015-11-09: 10 mg via ORAL
  Filled 2015-11-09 (×2): qty 2

## 2015-11-09 MED ORDER — POTASSIUM CHLORIDE ER 10 MEQ PO TBCR
40.0000 meq | EXTENDED_RELEASE_TABLET | ORAL | Status: AC
  Administered 2015-11-09 (×2): 40 meq via ORAL
  Filled 2015-11-09 (×2): qty 4

## 2015-11-09 MED ORDER — POTASSIUM CHLORIDE ER 10 MEQ PO TBCR
40.0000 meq | EXTENDED_RELEASE_TABLET | Freq: Two times a day (BID) | ORAL | Status: DC
  Administered 2015-11-09 – 2015-11-11 (×4): 40 meq via ORAL
  Filled 2015-11-09 (×5): qty 4

## 2015-11-09 MED ORDER — DEXTROSE 5 % IV SOLN
1.0000 g | INTRAVENOUS | Status: DC
  Administered 2015-11-09 – 2015-11-10 (×2): 1 g via INTRAVENOUS
  Filled 2015-11-09 (×2): qty 10

## 2015-11-09 NOTE — Progress Notes (Signed)
Sitting up in chair.

## 2015-11-09 NOTE — Consult Note (Signed)
WOC ostomy consult note Stoma type/location: RUQ Colostomy Stomal assessment/size: 1 inch round, pale pink, moist Peristomal assessment: intact Treatment options for stomal/peristomal skin: none indicated Output serosanguinous effluent Ostomy pouching: 1pc.pouching system, Kellie Simmering [phone]  Education provided: Patient provided comfort; with bleeding into pouch, a scope is planned and may be done later today.  She is relieved that I offered to change her pouch; she is clearly independent, just wanted reassurance that bleeding was not from stoma. I have provided abn extra pouch and cut it out for her since if a scop is preformed, the existing 1-piece pouch will have to be removed. [facility] nursing team will not follow, but will remain available to this patient, the nursing and medical teams.  Please re-consult if needed. Thanks, Maudie Flakes, MSN, RN, [place], Arther Abbott  Pager# [phone]

## 2015-11-09 NOTE — Progress Notes (Signed)
Pharmacy Antibiotic Note  Erin Santiago is a 60 y.o. female admitted on 11/06/2015 with sepsis.  She is seen at [facility] for endometrial stromal sarcoma with spinal metastasis & is currently undergoing Doxil chemotherapy (last dose 10/30/15). Pharmacy has been consulted for Zosyn dosing.  WBC 26.2, afeb, ANC 34.3  Plan: Zosyn 3.375g IV q8h (4 hour infusion).  Monitor renal function and cx data  Blood cultures negative to date and urine cultures showing Ecoli, consider switching to PO cefazolin (Keflex) to treat UTI  Height: 5' (152.4 cm) Weight: 190 lb (86.183 kg) IBW/kg (Calculated) : 45.5  Temp (24hrs), Avg:99.6 F (37.6 C), Min:99.2 F (37.3 C), Max:99.9 F (37.7 C)   Recent Labs Lab 11/05/15 1202 11/06/15 1126 11/06/15 1134 11/06/15 1441 11/07/15 0354 11/08/15 0500 11/09/15 0654  WBC 48.8*  --  40.5*  --  39.6* 30.5* 26.2*  CREATININE 1.3* 1.2*  --   --  1.27* 1.32* 1.20*  LATICACIDVEN  --   --   --  1.01  --   --   --     Estimated Creatinine Clearance: 48.6 mL/min (by C-G formula based on Cr of 1.2).    No Known Allergies  Antimicrobials this admission: 6/2  Vanc >> 6/4 6/2 Zosyn >>   Dose adjustments this admission:  Microbiology results: 6/2 BCx: ngtd 6/2 UCx: EColi (R-ampicillin, unasyn, cipro) 6/2 Cdiff PCR: neg 6/2 GI Panel:neg   Thank you for allowing pharmacy to be a part of this patient's care.  Darl Pikes, PharmD Clinical Pharmacist- Resident Pager: [phone]   11/09/2015 8:48 AM

## 2015-11-09 NOTE — Progress Notes (Signed)
PROGRESS NOTE    Erin Santiago  [id number] DOB: Dec 20, 1955 DOA: 11/06/2015 PCP: Merrilee Seashore, MD  Outpatient Specialists:Dr.Livesay, Dr.Soper at [facility] Brief Narrative: Erin Santiago is a 60 y.o. 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 She just started palliative chemotherapy after 4 month hiatus, last Friday, she went to the [facility] for blood transfusion on 6/2 and was found to be febrile up to 101, and tachycardic hence she was sent to the emergency room for further evaluation and management.  Assessment & Plan:   1. Ecoli UTI -U Cx with >100K EColi, Blood cultures drawn at Cancer center -will request results today, unable to view this  -It is also possible that she has an intra-abdominal process/infection that is brewing, however I suspect her options will be significantly limited due to the advanced nature of her underlying cancer and disease progression, and h/o abd surgeries/drains. -Hold off CT abdomen pelvis at this time, due to lack of any abd symptoms, d/w Dr.Livesay -last CT 5/18 at [facility] with progression of intra Abd disease, and some hydronephrosis -change Zosyn to IV ceftriaxone, could change to Po Cefpodoxime or Ceftin in am if stable -fevers improving, some abd symptoms today  2. Widely metastatic endometrial sarcoma with progression of disease in the abdomen/pelvis, metastases to the spine, possibly long just  started palliative chemotherapy last week -Severely limited in terms of available options given the complexity of her disease -Hospice was discussed last month, patient elected for palliative chemotherapy instead -d/w Dr.Livesay, DNR now  3. Leukocytosis -Baseline WBC count in the 25-30,000 range -40.5 K on admission, with fever, >39K>30K -last CT 5/18 at [facility] with progression of intra Abd disease, and some hydronephrosis  4. Anemia -Due to chronic disease, malignancy, chemotherapy -Ongoing chronic blood loss related to her small bowel fistula -s/p 2units PRBC 6/3 -Dr.Livesay requests GI input to see if scoping a stoma would be a option, due to ongoing bloody drainage chronically, d/w Dr.Ganem today, he will consult-thank you   5. Mild to moderate hydronephrosis noted on last CT abdomen pelvis in [place] on 5/18 -Making good amounts of urine, with her making good amounts of urine at this time doubt intervention/stenting would be required at this time  6. Mild AKI -Baseline creatinine close to 1 now it is 1.2 -Hydrate, monitor  7. Severe protein calorie malnutrition -Related to progressive malignancy and intra-abdominal complications   Code Status: DNR now DVT Prophylaxis:lovenox Family Communication:No family at bedside Disposition Plan:  to be determined, Home in few days if stable   Consultants:  Dr.Livesay    Antimicrobials: Vanc Zosyn 6/2->   Subjective: Feels better, continues to have bloody drainage from stoma -no issues overnight  Objective: Filed Vitals:   11/08/15 0458 11/08/15 1240 11/08/15 2059 11/09/15 0607  BP: 125/60 122/73 124/68 137/73  Pulse: 90 98 94 86  Temp: 98.9 F (37.2 C) 99.7 F (37.6 C) 99.9 F (37.7 C) 99.2 F (37.3 C)  TempSrc: Oral Oral Oral Oral  Resp: 20 15 16  20  Height:      Weight:      SpO2: 100% 98% 100% 99%    Intake/Output Summary (Last 24 hours) at 11/09/15 1000 Last data filed at 11/09/15 0934  Gross per 24 hour   Intake    240 ml  Output      0 ml  Net    240 ml   Filed Weights   11/06/15 1321 11/06/15 1717  Weight: 85.73 kg (189 lb) 86.183 kg (190 lb)    Examination:  General: Appears calm and comfortable, chronically ill-appearing obese,   Eyes: PERRLpallor noted  NT: grossly normal hearing, dry lips & tongue  Neck: no LAD, masses or thyromegaly  Cardiovascular: RRR, no m/r/g. 1+LE edema.  Telemetry: SR, no arrhythmias   Respiratory: CTA bilaterally, no w/r/r. Normal respiratory effort.  Abdomen: softobese, nontender, nondistended, mucocutaneous fistula with blood-tinged drainage  Skin: no rash or induration seen on limited exam  Musculoskeletal: grossly normal tone BUE/BLE  PsychiatricFlat affect, avoids eye contact  Neurologic : grossly non-focal, very deconditioned     Data Reviewed: I have personally reviewed following labs and imaging studies  CBC:  Recent Labs Lab 11/05/15 1202 11/06/15 1134 11/07/15 0354 11/08/15 0500 11/09/15 0654  WBC 48.8* 40.5* 39.6* 30.5* 26.2*  NEUTROABS 42.2* 34.3*  --   --   --   HGB 8.3* 8.0* 7.7* 8.9* 8.4*  HCT 25.6* 23.8* 23.4* 26.4* 25.5*  MCV 86.4 85.3 86.3 83.8 86.1  PLT 437* 304 329 292 [id number]   Basic Metabolic Panel:  Recent Labs Lab 11/05/15 1202 11/06/15 1126 11/07/15 0354 11/08/15 0500 11/09/15 0654  NA 132* 132* 129* 134* 135  K 2.8* 3.1* 3.1* 2.8* 2.8*  CL  --   --  104 108 108  CO2 22 19* 18* 17* 18*  GLUCOSE 131 105 115* 130* 93  BUN 11.4 11.4 10 10 8   CREATININE 1.3* 1.2* 1.27* 1.32* 1.20*  CALCIUM 8.8 8.7 7.3* 7.5* 7.7*   GFR: Estimated Creatinine Clearance: 48.6 mL/min (by C-G formula based on Cr of 1.2). Liver Function Tests:  Recent Labs Lab 11/05/15 1202 11/06/15 1126 11/07/15 0354 11/09/15 0654  AST 11 9 12* 8*  ALT 9 9 12* 8*  ALKPHOS 142 126 99 84  BILITOT 1.23* 1.29* 1.3* 1.0  PROT 6.8 6.6 5.8* 5.3*  ALBUMIN 2.6* 2.5* 2.4* 2.2*   No results for input(s): LIPASE, AMYLASE in  the last 168 hours. No results for input(s): AMMONIA in the last 168 hours. Coagulation Profile:  Recent Labs Lab 11/07/15 0943  INR 1.65*   Cardiac Enzymes: No results for input(s): CKTOTAL, CKMB, CKMBINDEX, TROPONINI in the last 168 hours. BNP (last 3 results) No results for input(s): PROBNP in the last 8760 hours. HbA1C: No results for input(s): HGBA1C in the last 72 hours. CBG: No results for input(s): GLUCAP in the last 168 hours. Lipid Profile: No results for input(s): CHOL, HDL, LDLCALC, TRIG, CHOLHDL, LDLDIRECT in the last 72 hours. Thyroid Function Tests: No results for input(s): TSH, T4TOTAL, FREET4, T3FREE, THYROIDAB in the last 72 hours. Anemia Panel: No results for input(s): VITAMINB12, FOLATE, FERRITIN, TIBC, IRON, RETICCTPCT in the last 72 hours. Urine analysis:    Component Value Date/Time   COLORURINE AMBER* 11/06/2015 1454   APPEARANCEUR CLOUDY* 11/06/2015 1454   LABSPEC 1.019 11/06/2015 1454   LABSPEC 1.010 12/30/2014 1524   PHURINE 6.0 11/06/2015 1454   PHURINE 7.5 12/30/2014 1524   GLUCOSEU NEGATIVE 11/06/2015 1454   GLUCOSEU Negative 12/30/2014 1524  HGBUR NEGATIVE 11/06/2015 1454   HGBUR Negative 12/30/2014 1524   BILIRUBINUR NEGATIVE 11/06/2015 1454   BILIRUBINUR Negative 12/30/2014 1524   KETONESUR NEGATIVE 11/06/2015 1454   KETONESUR Negative 12/30/2014 1524   PROTEINUR 100* 11/06/2015 1454   PROTEINUR Negative 12/30/2014 1524   UROBILINOGEN 0.2 12/30/2014 1524   UROBILINOGEN 0.2 09/11/2014 0335   NITRITE POSITIVE* 11/06/2015 1454   NITRITE Negative 12/30/2014 1524   LEUKOCYTESUR SMALL* 11/06/2015 1454   LEUKOCYTESUR Moderate 12/30/2014 1524   Sepsis Labs: @LABRCNTIP (procalcitonin:4,lacticidven:4)  ) Recent Results (from the past 240 hour(s))  Culture, Blood     Status: None (Preliminary result)   Collection Time: 11/06/15 11:27 AM  Result Value Ref Range Status   BLOOD CULTURE, ROUTINE Preliminary report  Preliminary   RESULT 1  Comment  Preliminary    Comment: No growth detected at this time.  TECHNOLOGIST REVIEW     Status: None   Collection Time: 11/06/15 11:34 AM  Result Value Ref Range Status   Technologist Review   Final    Sl toxic granulation, Occ vacuoles and dohle bodies  Culture, Blood     Status: None (Preliminary result)   Collection Time: 11/06/15 11:40 AM  Result Value Ref Range Status   BLOOD CULTURE, ROUTINE Preliminary report  Preliminary   RESULT 1 Comment  Preliminary    Comment: No growth detected at this time.  Urine culture     Status: Abnormal   Collection Time: 11/06/15  2:54 PM  Result Value Ref Range Status   Specimen Description URINE, CLEAN CATCH  Final   Special Requests NONE  Final   Culture >=100,000 COLONIES/mL ESCHERICHIA COLI (A)  Final   Report Status 11/08/2015 FINAL  Final   Organism ID, Bacteria ESCHERICHIA COLI (A)  Final      Susceptibility   Escherichia coli - MIC*    AMPICILLIN >=32 RESISTANT Resistant     CEFAZOLIN <=4 SENSITIVE Sensitive     CEFTRIAXONE <=1 SENSITIVE Sensitive     CIPROFLOXACIN >=4 RESISTANT Resistant     GENTAMICIN <=1 SENSITIVE Sensitive     IMIPENEM <=0.25 SENSITIVE Sensitive     NITROFURANTOIN <=16 SENSITIVE Sensitive     TRIMETH/SULFA <=20 SENSITIVE Sensitive     AMPICILLIN/SULBACTAM >=32 RESISTANT Resistant     PIP/TAZO <=4 SENSITIVE Sensitive     * >=100,000 COLONIES/mL ESCHERICHIA COLI  C difficile quick scan w PCR reflex     Status: None   Collection Time: 11/06/15  4:19 PM  Result Value Ref Range Status   C Diff antigen NEGATIVE NEGATIVE Final   C Diff toxin NEGATIVE NEGATIVE Final   C Diff interpretation Negative for toxigenic C. difficile  Final  Gastrointestinal Panel by PCR , Stool     Status: None   Collection Time: 11/06/15  4:19 PM  Result Value Ref Range Status   Campylobacter species NOT DETECTED NOT DETECTED Final   Plesimonas shigelloides NOT DETECTED NOT DETECTED Final   Salmonella species NOT DETECTED NOT  DETECTED Final   Yersinia enterocolitica NOT DETECTED NOT DETECTED Final   Vibrio species NOT DETECTED NOT DETECTED Final   Vibrio cholerae NOT DETECTED NOT DETECTED Final   Enteroaggregative E coli (EAEC) NOT DETECTED NOT DETECTED Final   Enteropathogenic E coli (EPEC) NOT DETECTED NOT DETECTED Final   Enterotoxigenic E coli (ETEC) NOT DETECTED NOT DETECTED Final   Shiga like toxin producing E coli (STEC) NOT DETECTED NOT DETECTED Final   E. coli O157 NOT DETECTED NOT DETECTED Final  Shigella/Enteroinvasive E coli (EIEC) NOT DETECTED NOT DETECTED Final   Cryptosporidium NOT DETECTED NOT DETECTED Final   Cyclospora cayetanensis NOT DETECTED NOT DETECTED Final   Entamoeba histolytica NOT DETECTED NOT DETECTED Final   Giardia lamblia NOT DETECTED NOT DETECTED Final   Adenovirus F40/41 NOT DETECTED NOT DETECTED Final   Astrovirus NOT DETECTED NOT DETECTED Final   Norovirus GI/GII NOT DETECTED NOT DETECTED Final   Rotavirus A NOT DETECTED NOT DETECTED Final   Sapovirus (I, II, IV, and V) NOT DETECTED NOT DETECTED Final         Radiology Studies: No results found.      Scheduled Meds: . cefTRIAXone (ROCEPHIN)  IV  1 g Intravenous Q24H  . cholestyramine  1 packet Oral Q12H  . diphenoxylate-atropine  1 tablet Oral BID  . feeding supplement  1 Container Oral TID BM  . pantoprazole  40 mg Oral Daily  . phytonadione  10 mg Oral Once  . [START ON 11/10/2015] potassium chloride  40 mEq Oral BID  . potassium chloride  40 mEq Oral Q2H   Continuous Infusions:    LOS: 3 days    Time spent: 44min    Domenic Polite, MD [facility] Pager [phone]  If 7PM-7AM, please contact night-coverage [email or web address] Password [email or web address] 11/09/2015, 10:00 AM

## 2015-11-09 NOTE — Evaluation (Signed)
Physical Therapy Evaluation Patient Details Name: Erin Santiago MRN: [id number] DOB: 1955/12/22 Today's Date: 11/09/2015   History of Present Illness  60 yo female admitted with sepsis, mucous fistula, new lesion T4. Hx of endometrial stromal sarcoma, pelvic abscesses.   Clinical Impression  On eval, pt was supervision level assist for mobility-walked ~300 feet. Dyspnea 2/4. O2 sats 95%, HR 115 bpm with ambulation. Pt states she plans to return home after d/c. She reports she has assistance as needed.     Follow Up Recommendations Home health PT;Supervision - Intermittent    Equipment Recommendations  None recommended by PT    Recommendations for Other Services       Precautions / Restrictions Precautions Precautions: Fall Restrictions Weight Bearing Restrictions: No      Mobility  Bed Mobility               General bed mobility comments: oob in recliner  Transfers Overall transfer level: Needs assistance   Transfers: Sit to/from Stand Sit to Stand: Modified independent (Device/Increase time)            Ambulation/Gait Ambulation/Gait assistance: Supervision Ambulation Distance (Feet): 300 Feet Assistive device:  (intermittent use of hallway handrail) Gait Pattern/deviations: Wide base of support     General Gait Details: increased lateral sway. Pt held onto hallway handrail intermittently. Dyspnea 2/4. O2 sats 95%, HR 115 bpm. Pt declined rollator use.   Stairs            Wheelchair Mobility    Modified Rankin (Stroke Patients Only)       Balance                                             Pertinent Vitals/Pain Pain Assessment: Faces Faces Pain Scale: Hurts little more Pain Location: abdomen Pain Intervention(s): Monitored during session    Home Living Family/patient expects to be discharged to:: Private residence Living Arrangements: Alone Available Help at Discharge: Friend(s);Available PRN/intermittently Type  of Home: Mobile home Home Access: Ramped entrance     Home Layout: One level Home Equipment: Walker - 4 wheels      Prior Function Level of Independence: Independent               Hand Dominance        Extremity/Trunk Assessment   Upper Extremity Assessment: Overall WFL for tasks assessed           Lower Extremity Assessment: Generalized weakness      Cervical / Trunk Assessment: Normal  Communication   Communication: No difficulties  Cognition Arousal/Alertness: Awake/alert Behavior During Therapy: WFL for tasks assessed/performed Overall Cognitive Status: Within Functional Limits for tasks assessed                      General Comments      Exercises        Assessment/Plan    PT Assessment Patient needs continued PT services  PT Diagnosis Difficulty walking   PT Problem List Decreased mobility  PT Treatment Interventions Gait training;Functional mobility training   PT Goals (Current goals can be found in the Care Plan section) Acute Rehab PT Goals Patient Stated Goal: home soon PT Goal Formulation: With patient Time For Goal Achievement: 11/23/15 Potential to Achieve Goals: Good    Frequency Min 3X/week   Barriers to discharge  Co-evaluation               End of Session   Activity Tolerance: Patient tolerated treatment well Patient left: in chair;with call bell/phone within reach           Time: 1526-1535 PT Time Calculation (min) (ACUTE ONLY): 9 min   Charges:   PT Evaluation $PT Eval Low Complexity: 1 Procedure     PT G Codes:        Weston Anna, MPT Pager: [phone]

## 2015-11-09 NOTE — Consult Note (Signed)
Subjective:   HPI  The patient is a 60 year old female with a history of metastatic endometrial stromal sarcoma extensive to the pelvis. She has undergone 2 surgeries in the past in regards to this. Her most recent surgery was in April of this year a [facility] where she had creation of a mucous fistula of the small bowel. We are asked to see her in regards to ongoing bleeding from this mucous fistula. She describes the blood as old-appearing and dark most of the time. According to her she still has bowel movements and is eating and no food or anything else comes out of this mucous fistula other than blood and mucus. I do not know the length of this mucous fistula for exactly what portion of the small intestine it is at. In any event she has had continued bleeding from this. In reviewing Dr.Livesay's note, her surgeon Dr. Clarene Essex feels that the bleeding from this mucous fistula is secondary to tumor involvement. She has been bleeding daily and periodically requiring blood transfusions. We are asked to see her to see if endoscopic evaluation of the mucous fistula could pinpoint the site of bleeding or would be of benefit to her overall condition.  Review of Systems No chest pain or shortness of breath  Past Medical History  Diagnosis Date  . Abnormal uterine bleeding   . Anemia   . Fibroid   . Heart murmur     under 5 yrs of age  . Cancer ([place]) dx'd 07/2014    endometrial stromal sarcoma  . Radiation 12/01/14-01/05/15    pelvis 45 gray  . Radiation 05/18/2015-06/05/15    T4 thoracic spine area 35 gray   Past Surgical History  Procedure Laterality Date  . Laparoscopic gastric sleeve resection  2011  . Abdominal hysterectomy  07/2014    at [place] History  . Marital Status: Single    Spouse Name: N/A  . Number of Children: 0  . Years of Education: N/A   Occupational History  . secretarial    Social History Main Topics  . Smoking status: Never Smoker   .  Smokeless tobacco: Never Used  . Alcohol Use: No  . Drug Use: No  . Sexual Activity: No   Other Topics Concern  . Not on file   Social History Narrative   family history includes Diabetes in her mother; Heart attack in her father; Hypertension in her mother; Non-Hodgkin's lymphoma (age of onset: 7) in her mother; Other in her father; Thyroid disease in her father.  Current facility-administered medications:  .  acetaminophen (TYLENOL) tablet 1,000 mg, 1,000 mg, Oral, Q6H PRN, Domenic Polite, MD, 1,000 mg at 11/07/15 1902 .  cefTRIAXone (ROCEPHIN) 1 g in dextrose 5 % 50 mL IVPB, 1 g, Intravenous, Q24H, Domenic Polite, MD, 1 g at 11/09/15 1225 .  cholestyramine Lucrezia Starch) packet 1 packet, 1 packet, Oral, Q12H, Domenic Polite, MD, 1 packet at 11/09/15 1227 .  diphenoxylate-atropine (LOMOTIL) 2.5-0.025 MG per tablet 1 tablet, 1 tablet, Oral, BID, Domenic Polite, MD, 1 tablet at 11/09/15 1028 .  feeding supplement (BOOST / RESOURCE BREEZE) liquid 1 Container, 1 Container, Oral, TID BM, Domenic Polite, MD, 1 Container at 11/09/15 1027 .  HYDROmorphone (DILAUDID) injection 1 mg, 1 mg, Intravenous, Q3H PRN, Domenic Polite, MD .  ondansetron (ZOFRAN) tablet 4 mg, 4 mg, Oral, Q6H PRN **OR** ondansetron (ZOFRAN) injection 4 mg, 4 mg, Intravenous, Q6H PRN, Domenic Polite, MD .  pantoprazole (  PROTONIX) EC tablet 40 mg, 40 mg, Oral, Daily, Domenic Polite, MD, 40 mg at 11/09/15 1028 .  [START ON 11/10/2015] potassium chloride (K-DUR) CR tablet 40 mEq, 40 mEq, Oral, BID, Domenic Polite, MD .  potassium chloride (K-DUR) CR tablet 40 mEq, 40 mEq, Oral, Q2H, Domenic Polite, MD, 40 mEq at 11/09/15 1630  Facility-Administered Medications Ordered in Other Encounters:  .  sodium chloride flush (NS) 0.9 % injection 10 mL, 10 mL, Intravenous, PRN, Gordy Levan, MD No Known Allergies   Objective:     BP 123/71 mmHg  Pulse 93  Temp(Src) 99.3 F (37.4 C) (Oral)  Resp 18  Ht 5' (1.524 m)  Wt 86.183 kg (190  lb)  BMI 37.11 kg/m2  SpO2 98%  LMP 02/04/2014  No distress  Nonicteric  Heart regular rhythm no murmurs  Lungs clear  Abdomen: Bowel sounds present, soft, not tender to palpation, mucous fistula stoma noted on the left side of the abdomen. No obvious blood coming from it at this time.  Laboratory No components found for: D1    Assessment:     Metastatic endometrial stomal sarcoma.  Ongoing bleeding from mucous fistula probably related to tumor involvement      Plan:     This patient has been bleeding from her mucous fistula since surgery according to her. She states that Dr. Clarene Essex the surgeon knows about this. In reviewing oncology's note it was Dr. Clarene Essex is feeling that the bleeding was related to tumor involvement. If this bleeding is from tumor involvement there is nothing specifically endoscopically that one could do to stop the bleeding as it would be coming from necrotic tumor. I mentioned this to the patient. I also told her that if there was necrotic tumor at the end of her mucous fistula or somewhere in the mucous fistula that introducing air into the mucous fistula at the time of endoscopy could cause perforation. She was unaware of that but after thinking about it said she didn't want that to happen. At the present time I'm not sure how to definitely approach this problem. I suspect endoscopy would not find a treatable cause for her bleeding and may be of higher risk than benefit. I will give the patient some time to think about this and discuss further with her oncologist. I will recheck on her in a day or 2 and see what she is thinking Lab Results  Component Value Date   HGB 8.4* 11/09/2015   HGB 8.9* 11/08/2015   HGB 7.7* 11/07/2015   HGB 8.0* 11/06/2015   HGB 8.3* 11/05/2015   HGB 8.5* 10/30/2015   HCT 25.5* 11/09/2015   HCT 26.4* 11/08/2015   HCT 23.4* 11/07/2015   HCT 23.8* 11/06/2015   HCT 25.6* 11/05/2015   HCT 26.4* 10/30/2015   ALKPHOS 84 11/09/2015    ALKPHOS 99 11/07/2015   ALKPHOS 126 11/06/2015   ALKPHOS 142 11/05/2015   ALKPHOS 155* 10/30/2015   ALKPHOS 93 08/27/2015   AST 8* 11/09/2015   AST 12* 11/07/2015   AST 9 11/06/2015   AST 11 11/05/2015   AST 15 10/30/2015   AST 19 08/27/2015   ALT 8* 11/09/2015   ALT 12* 11/07/2015   ALT 9 11/06/2015   ALT 9 11/05/2015   ALT 16 10/30/2015   ALT 15 08/27/2015

## 2015-11-09 NOTE — Progress Notes (Signed)
MEDICAL ONCOLOGY November 09, 2015, 9:27 AM  Hospital day 4 Antibiotics: zosyn Chemotherapy: first doxil 10-30-15. No gCSF. (this was first chemo since gem taxotere thru 10-2014)  Outpatient Physicians: [name]/ [name]/ [name] [facility]), [name], [name] [facility]),[name] (PCP [facility]), [name]; [name], [name] Jim Desanctis  GI)   EMR reviewed. PRBCs x 2 units given 11-07-15 and #1 feraheme on 11-08-15. Urine culture from admission E coli.   Patient up in recliner chair, no visitors here presently  Subjective: Has felt some better since transfusion and IV iron, now able to get to BR without assistance. 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 Some "bladder pressure", no frank dysuria. No persistent abdominal pain otherwise. No other bleeding. Drinking at least 3 Boost Breeze daily. Bowels moving. Denies increased SOB. Some cough when swallows liquids or other, not productive and overall cough is improved since respiratory infection a few weeks ago. No problems with PAC. No skin irritation from first doxil. No mouth or throat soreness.  Patient has refused prophylactic lovenox, which I have DCd now with ongoing bleeding. She has not tolerated SCDs well in past but is willing to try these again.     ONCOLOGIC HISTORY Patient had heavy postmenopausal bleeding Sept 2015, then lesser bleeding over next several months until she was seen in 06-2014 by [name]. Hemoglobin was 12.7 on 07-04-14. CT CAP 06-25-14 had uterus 16.4 x 13.5 x 14.4 cm with marked expansion of endometrial canal by heterogeneous mass, bilateral pelvic sidewall adenopathy, iliac adenopathy with no definite retroperitoneal or mesenteric adenopathy, no ascites, no liver mets. Attempted endometrial sampling 06-26-14 and 07-03-14 was nondiagnostic; around that time the patient was passing clots and using one  large maxipad hourly. She was seen by [name] 07-05-14, with uterine fundus palpable above umbilicus. She had surgery by [name] at [facility] on 07-14-14, which was exploratory laparotomy with TAH BSO, bilateral total pelvic lymphadenectomy and sampling of aortic and renal nodes. 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 Case was presented at [facility] multidisciplinary conference 07-23-14, with recommendation for PET CT to evaluate inguinal and portahepatis nodes, and to consider adjuvant gemzar taxotere and possibly follow with 4 cycles of adriamycin, then to consider whole pelvic RT. She saw [name] for post op follow up on 07-28-14, with recommendation for gemzar taxotere and adriamycin, whole pelvic RT after chemo and consideration of Megace maintenance after chemo (possibly prior to negative ER PR information). PET 08-01-14 had some uptake in aortocaval and left paraaortic regions. She had day 1 cycle 1 gemzar taxotere on 08-28-14, day 8 cycle 1 on 09-04-14 and neulasta on 09-06-14. Admitted with neutropenic fever on day 14 cycle 1, with oral mucositis and some diarrhea. Counts maintained cycle 2 using OnPro neulasta day 9. She had progressive LE swelling after cycle 3, with venous dopplers negative 10-23-14, then LE swelling and SOB so marked after day 1 cycle 4 10-30-14 that chemo was held. Restaging CT AP + CXR 11-10-14 had stable tiny left pulmonary nodule/ no pleural effusion and normal heart size; CT had necrotic aortocaval adenopathy, iliac adenopathy L>R and 7x10 cm fluid collection in pelvis. She received IMRT 45 gray in 25 fractions to pelvis from 6-27 thru 01-05-15, with resolution of LE swelling by completion of course.  CT CAP 02-03-15 showed improvement in retroperitoneal and pelvic involvement. She went onto observation after RT, plan to wait until clear progressive disease  before resuming treatment, possibly adriamycin vs on study. CT CAP 05-07-15 showed new lytic lesion at T4, stable 2-3 mm pulmonary nodules, increased left pelvic and retroperitoneal nodes and RLQ peritoneal nodule. Radiation therapy summary as follows: Indication for treatment: A new bone lesion in the right side of the T4 vertebral body, with soft tissue extension slightly impinging upon the central spinal canal, some pain from this lesion Radiation treatment dates: 05/18/2015 through 06/05/2015 Site/dose: T4 thoracic spine area, 35 gray in 14 fractions She was not eligible for any of the arms of MATCH trial by evaluation in Jan 2017. Plan was to begin treatment with adriamycin + olaratumab, however this not begun prior to hospitalization 07-30-15 with abdominal/ pelvic abscess. She was hospitalized at [facility] from 2-23 thru 08-27-15 with bowel fistulas and abdominopelvic abscesses. She was transferred to [facility] on 08-27-15 and remained hospitalized there thru ~ 09-28-15, with 2 surgeries for bowel involvement with malignancy. IR drains fell out in 10-2015, with CT AP [facility] 10-22-15 showing progression of extensive intraabdominal disease. 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 Patient requested trying further chemo, first doxil given 10-30-15.   Objective: Vital signs in last 24 hours: Blood pressure 137/73, pulse 86, temperature 99.2 F (37.3 C), temperature source Oral, resp. rate 20, height 5' (1.524 m), weight 190 lb (86.183 kg), last menstrual period 02/04/2014, SpO2 99 %. Awake, alert, color better and looks more comfortable than when I saw her prior to PRBCs. Oral mucosa moist, tongue with some coating. Breath sounds heard to lower fields bilaterally. Heart RRR. PAC site ok. Abdomen soft, not distended, BS present. Blood in ostomy bag.  Abdomen not tender. LE no pitting edema, cords, tenderness  Intake/Output from previous day: 06/04 0701 - 06/05 0700 In: 0  Out: 225 [Stool:225] Intake/Output this shift:     Lab Results:  Recent Labs  11/08/15 0500 11/09/15 0654  WBC 30.5* 26.2*  HGB 8.9* 8.4*  HCT 26.4* 25.5*  PLT 292 351   BMET  Recent Labs  11/08/15 0500 11/09/15 0654  NA 134* 135  K 2.8* 2.8*  CL 108 108  CO2 17* 18*  GLUCOSE 130* 93  BUN 10 8  CREATININE 1.32* 1.20*  CALCIUM 7.5* 7.7*    Studies/Results: No results found.   DISCUSSION Patient has talked with me again about code status. I have explained again that, if she declines to point that we would consider intubation or cardiac resuscitation, these interventions would not improve her quality of life or otherwise improve the underlying advanced malignancy. She does not want to be on life support. She does want interventions short of resuscitation and mechanical life support that might improve and extend quality time. She is aware that this advanced sarcoma is not curable. She tells me that she needs to complete her will, which she prefers to do with her lawyer.   Dr Broadus John has contacted GI consultants, for consideration of evaluating the bleeding mucous fistula. I have added oral Vit K as INR slightly elevated.   Discussed again the choice of doxil, particularly due to active complications from metastatic sarcoma. She wants to continue treatment, understands that we will decide based on status at the time treatment is  due.   Assessment/Plan: 1. 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 No treatment available on MATCH trial thru [facility]. Plan had been for adriamycin + olaratumab in Feb, not begun due to interval bowel perforation(s) with abdominal and pelvic abscesses. Now post surgeries x 2 with entero- enterostomy and mucous fistula. She initially  improved from very complicated 2 month hospitalization,and at her request was given doxil (dose reduced) on 10-30-15 in palliative attempt. Had CT AP at [facility] 10-22-15 as above. IR drains at [facility] were not successful. Overall prognosis obviously very poor, doxil in palliative attempt, chosen particularly as generally easy to tolerate. She has not wanted to stop all treatment yet, tho Hospice has been discussed. Palliative Care follow up in hospital would be useful.  2.E coli UTI on admission, blood cultures negative to date.  Recent information: completed 14 day course of levaquin and flagyl prior to 10-30-15 Had E coli in pelvic abscesses in 07-2015, GPC in 1/2 blood cultures 08-24-15, MSSA in blood cx at [facility].  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 Bleeding is significant and it would be helpful if any way to improve this. May need additional transfusion on 6-6, Vit K now. Hold prophylactic lovenox. Appreciate GI consult, ? Endoscopic evaluation of the mucous fistula? 4. Severe iron deficiency anemia; 2 units PRBCs and feraheme since admission. 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 May need additional PRBCs prior to DC; will give second feraheme dose at [facility]. 5. protein calorie malnutrition: now oral intake only, likes Boost Breeze. Has been on clear liquids 6.degenerative arthritis knees very symptomatic prior to all of recent complications 7. DNR discussed and confirmed, advance directives in place 8.social situation: lives alone, equidistant [place] and [place]. Caregiver arranged for several hours daily, [place] nursing and PT involved prior to this admission, set up by [facility] after surgery. Still very deconditioned. 9.mild-moderate bilateral hydronephrosis by [facility] CT, creatinine stable today.  10.hx psoriasis and nonmelanoma skin ca. Hx morbid obesity and previous gastric banding 11. PAC fine for IVs/ blood draws 12.oral thrush previously,   resolved with diflucan. Follow with antibiotics 13. Hypokalemia supplementing, follow  Please page me if needed between my rounds [place]

## 2015-11-10 ENCOUNTER — Other Ambulatory Visit: Payer: Self-pay | Admitting: Oncology

## 2015-11-10 ENCOUNTER — Inpatient Hospital Stay (HOSPITAL_COMMUNITY): Payer: Federal, State, Local not specified - PPO

## 2015-11-10 DIAGNOSIS — A419 Sepsis, unspecified organism: Secondary | ICD-10-CM

## 2015-11-10 DIAGNOSIS — E43 Unspecified severe protein-calorie malnutrition: Secondary | ICD-10-CM

## 2015-11-10 DIAGNOSIS — D500 Iron deficiency anemia secondary to blood loss (chronic): Secondary | ICD-10-CM

## 2015-11-10 LAB — CBC
HEMATOCRIT: 25.7 % — AB (ref 36.0–46.0)
Hemoglobin: 8.5 g/dL — ABNORMAL LOW (ref 12.0–15.0)
MCH: 28.7 pg (ref 26.0–34.0)
MCHC: 33.1 g/dL (ref 30.0–36.0)
MCV: 86.8 fL (ref 78.0–100.0)
PLATELETS: 375 10*3/uL (ref 150–400)
RBC: 2.96 MIL/uL — ABNORMAL LOW (ref 3.87–5.11)
RDW: 15.9 % — AB (ref 11.5–15.5)
WBC: 25.8 10*3/uL — AB (ref 4.0–10.5)

## 2015-11-10 LAB — BASIC METABOLIC PANEL
ANION GAP: 8 (ref 5–15)
BUN: 8 mg/dL (ref 6–20)
CALCIUM: 8 mg/dL — AB (ref 8.9–10.3)
CHLORIDE: 110 mmol/L (ref 101–111)
CO2: 17 mmol/L — AB (ref 22–32)
Creatinine, Ser: 1.21 mg/dL — ABNORMAL HIGH (ref 0.44–1.00)
GFR calc Af Amer: 55 mL/min — ABNORMAL LOW (ref >60)
GFR calc non Af Amer: 48 mL/min — ABNORMAL LOW (ref >60)
Glucose, Bld: 104 mg/dL — ABNORMAL HIGH (ref 65–99)
POTASSIUM: 3.4 mmol/L — AB (ref 3.5–5.1)
Sodium: 135 mmol/L (ref 135–145)

## 2015-11-10 MED ORDER — SODIUM CHLORIDE 0.9 % IV SOLN
510.0000 mg | Freq: Once | INTRAVENOUS | Status: AC
  Filled 2015-11-10: qty 17

## 2015-11-10 MED ORDER — BOOST / RESOURCE BREEZE PO LIQD: 1.0000 | Freq: Three times a day (TID) | ORAL | Status: AC

## 2015-11-10 MED ORDER — FLUTICASONE PROPIONATE 50 MCG/ACT NA SUSP
1.0000 | Freq: Every day | NASAL | Status: DC
  Administered 2015-11-10 – 2015-11-11 (×2): 1 via NASAL
  Filled 2015-11-10: qty 16

## 2015-11-10 MED ORDER — BENZONATATE 100 MG PO CAPS
100.0000 mg | ORAL_CAPSULE | Freq: Three times a day (TID) | ORAL | Status: DC
  Administered 2015-11-10 (×2): 100 mg via ORAL
  Filled 2015-11-10 (×2): qty 1

## 2015-11-10 MED ORDER — CEPHALEXIN 500 MG PO CAPS
500.0000 mg | ORAL_CAPSULE | Freq: Three times a day (TID) | ORAL | Status: DC
  Administered 2015-11-10 – 2015-11-11 (×3): 500 mg via ORAL
  Filled 2015-11-10 (×4): qty 1

## 2015-11-10 MED ORDER — POTASSIUM CHLORIDE ER 10 MEQ PO TBCR: 20.0000 meq | EXTENDED_RELEASE_TABLET | Freq: Two times a day (BID) | ORAL | Status: DC

## 2015-11-10 MED ORDER — HYDROCOD POLST-CPM POLST ER 10-8 MG/5ML PO SUER: 5.0000 mL | Freq: Two times a day (BID) | ORAL | Status: DC | PRN

## 2015-11-10 MED ORDER — CEPHALEXIN 500 MG PO CAPS: 500.0000 mg | ORAL_CAPSULE | Freq: Three times a day (TID) | ORAL | Status: DC

## 2015-11-10 NOTE — Care Management Note (Signed)
Case Management Note  Patient Details  Name: Sheanna Dashnaw MRN: [id number] Date of Birth: June 14, 1955  Subjective/Objective:   60 yo admitted with Sepsis                 Action/Plan: Pt from home and had home health PT/RN from [place].  Pt would like to continue to have [facility] for HHPT/RN at discharge.  Will need MD orders for resumption of HHPT/RN. CM will continue to follow and assist as needed.  Expected Discharge Date:   (unknown)               Expected Discharge Plan:  [place]  In-House Referral:     Discharge planning Services  CM Consult  Post Acute Care Choice:  Resumption of Svcs/PTA Provider Choice offered to:  Patient  DME Arranged:    DME Agency:     HH Arranged:  RN, PT [place] Agency:  [place]  Status of Service:  In process, will continue to follow  Medicare Important Message Given:    Date Medicare IM Given:    Medicare IM give by:    Date Additional Medicare IM Given:    Additional Medicare Important Message give by:     If discussed at [place] of Stay Meetings, dates discussed:    Additional CommentsLynnell Catalan, RN 11/10/2015, 1:18 PM  [phone]

## 2015-11-10 NOTE — Progress Notes (Signed)
Nursing Note: Report given to [place],[email or web address]

## 2015-11-10 NOTE — Progress Notes (Signed)
OT Cancellation Note  Patient Details Name: Erin Santiago MRN: [id number] DOB: Jun 04, 1956   Cancelled Treatment:    Reason Eval/Treat Not Completed: OT screened, no needs identified, will sign off.  Noted, pt has a d/c summary written.  She was getting back from bathroom when I arrived. She feels comfortable with ADLs and has assistance as needed. She doesn't feel she needs any DME  Will sign off.  Kadden Osterhout 11/10/2015, 3:34 PM  Lesle Chris, OTR/L [phone] 11/10/2015

## 2015-11-10 NOTE — Progress Notes (Signed)
Results of renal US text paged to Dr. Broadus John.

## 2015-11-10 NOTE — Progress Notes (Signed)
November 10, 2015, 9:33 AM  Hospital day 5 Antibiotics: zosyn Chemotherapy: day 12 cycle 1 doxil  Outpatient Physicians: [name]/ [name]/ [name] [facility]), [name], [name] [facility] sarcoma clinic),[name], [name]; [name], [name] 11-09-15 Dr Anson Fret  EMR reviewed, including Dr Estell Harpin consult - thank you.  Patient seen, no visitors here.  Subjective: Reports continued discomfort when voids, tho less than on admission, and some urinary incontinence. Denies hematuria or pruritis vaginal area. Coughs with activity, nonproductive, denies SOB. Drinking 3 Boost daily, not ordering other food "I am waiting until I get home to eat", tho has had cheese/ crackers and I have encouraged her to eat today. Less bloody output from mucous fistula in last day, with present pouch not emptied since applied by ostomy RN [id number] on 11-09-15. Stools loose ~ 3x last 24 hrs, cx negative, frequency may be due to surgical changes in bowels. Walked with PT yesterday, not needing rolling walker now tho has this available at home. Not complaining of pain.  Patient understands from [name] that endoscopic evaluation of the mucous fistula might cause perforation and that bleeding if from tumor would not be amenable to intervention. She understands that endoscopic evaluation was considered so as not to miss something that could potentially improve the bleeding. I believe that she prefers not to have endoscopy done.   ONCOLOGIC HISTORY Patient had heavy postmenopausal bleeding Sept 2015, then lesser bleeding over next several months until she was seen in 06-2014 by [name]. Hemoglobin was 12.7 on 07-04-14. CT CAP 06-25-14 had uterus 16.4 x 13.5 x 14.4 cm with marked expansion of endometrial canal by heterogeneous mass, bilateral pelvic sidewall adenopathy, iliac adenopathy with no definite retroperitoneal or mesenteric adenopathy,  no ascites, no liver mets. Attempted endometrial sampling 06-26-14 and 07-03-14 was nondiagnostic; around that time the patient was passing clots and using one large maxipad hourly. She was seen by [name] 07-05-14, with uterine fundus palpable above umbilicus. She had surgery by [name] at [facility] on 07-14-14, which was exploratory laparotomy with TAH BSO, bilateral total pelvic lymphadenectomy and sampling of aortic and renal nodes. 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 Case was presented at [facility] multidisciplinary conference 07-23-14, with recommendation for PET CT to evaluate inguinal and portahepatis nodes, and to consider adjuvant gemzar taxotere and possibly follow with 4 cycles of adriamycin, then to consider whole pelvic RT. She saw [name] for post op follow up on 07-28-14, with recommendation for gemzar taxotere and adriamycin, whole pelvic RT after chemo and consideration of Megace maintenance after chemo (possibly prior to negative ER PR information). PET 08-01-14 had some uptake in aortocaval and left paraaortic regions. She had day 1 cycle 1 gemzar taxotere on 08-28-14, day 8 cycle 1 on 09-04-14 and neulasta on 09-06-14. Admitted with neutropenic fever on day 14 cycle 1, with oral mucositis and some diarrhea. Counts maintained cycle 2 using OnPro neulasta day 9. She had progressive LE swelling after cycle 3, with venous dopplers negative 10-23-14, then LE swelling and SOB so marked after day 1 cycle 4 10-30-14 that chemo was held. Restaging CT AP + CXR 11-10-14 had stable tiny left pulmonary nodule/ no pleural effusion and normal heart size; CT had necrotic aortocaval adenopathy, iliac adenopathy L>R and 7x10 cm fluid collection in pelvis. She  received IMRT 45 gray in 25 fractions to pelvis from 6-27 thru 01-05-15, with resolution of LE swelling by completion of course. CT  CAP 02-03-15 showed improvement in retroperitoneal and pelvic involvement. She went onto observation after RT, plan to wait until clear progressive disease before resuming treatment, possibly adriamycin vs on study. CT CAP 05-07-15 showed new lytic lesion at T4, stable 2-3 mm pulmonary nodules, increased left pelvic and retroperitoneal nodes and RLQ peritoneal nodule. Radiation therapy summary as follows: Indication for treatment: A new bone lesion in the right side of the T4 vertebral body, with soft tissue extension slightly impinging upon the central spinal canal, some pain from this lesion Radiation treatment dates: 05/18/2015 through 06/05/2015 Site/dose: T4 thoracic spine area, 35 gray in 14 fractions She was not eligible for any of the arms of MATCH trial by evaluation in Jan 2017. Plan was to begin treatment with adriamycin + olaratumab, however this not begun prior to hospitalization 07-30-15 with abdominal/ pelvic abscess. She was hospitalized at [facility] from 2-23 thru 08-27-15 with bowel fistulas and abdominopelvic abscesses. She was transferred to [facility] on 08-27-15 and remained hospitalized there thru ~ 09-28-15, with 2 surgeries for bowel involvement with malignancy. IR drains fell out in 10-2015, with CT AP [facility] 10-22-15 showing progression of extensive intraabdominal disease. 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 Patient requested trying further chemo, first doxil given 10-30-15.  Objective: Vital signs in last 24 hours: Blood pressure 125/66, pulse 92, temperature 99.1 F (37.3 C), temperature source Oral, resp. rate 18, height 5' (1.524 m), weight 190 lb (86.183 kg), last menstrual period 02/04/2014, SpO2 99 %.  Awake, alert, some coughing during conversation but overall looks fairly comfortable in recliner on RA. PAC infusing  without difficulty. Lungs with BS bilaterally, no wheezes or rales. Heart RRR. Abdomen soft, not tender. Approx 50 cc dark reddish liquid in mucous fistula ostomy bag. LE no edema, cords, tenderness.   Intake/Output from previous day: 06/05 0701 - 06/06 0700 In: 510 [P.O.:460; IV Piggyback:50] Out: -  Intake/Output this shift:      Lab Results:  Recent Labs  11/09/15 0654 11/10/15 0755  WBC 26.2* 25.8*  HGB 8.4* 8.5*  HCT 25.5* 25.7*  PLT 351 375   BMET  Recent Labs  11/09/15 0654 11/10/15 0755  NA 135 135  K 2.8* 3.4*  CL 108 110  CO2 18* 17*  GLUCOSE 93 104*  BUN 8 8  CREATININE 1.20* 1.21*  CALCIUM 7.7* 8.0*    Blood cultures still negative, not final  Studies/Results:  CXR on admission no acute findings and no clear pulmonary mets.  Renal US and post void bladder scan ordered, due to some hydronephrosis seen on [facility] 10-22-15 and complaints of bladder discomfort/ some low grade temp ongoing.  MEDICATIONS: added tessalon perles and tussionex . Added flonase as she has had post nasal drainage aggravating cough PTA.   DISCUSSION Patient wants to go home - note previously insisted on leaving [facility] after complicated course around those surgeries. Agrees to trying meds for cough. I have recommended post void bladder scan and renal US, which hopefully can be done this AM. I have explained that she is still on IV antibiotics and still some low grade temperature, tho certainly we understand that she would like to be DC as soon as appropriate.  She is aware of appointment to [facility] 11-19-15. I will add feraheme to that visit.   Assessment/Plan: 1. 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 No treatment available on MATCH trial thru [facility]. Plan had been for adriamycin + olaratumab in Feb, not begun due to interval bowel perforation(s) with abdominal and pelvic abscesses. Now  post surgeries x 2 with entero- enterostomy and mucous fistula. She initially improved from very complicated 2 month hospitalization,and at her request was given doxil (dose reduced) on 10-30-15 in palliative attempt. Had CT AP at [facility] 10-22-15 as above. IR drains at [facility] were not successful. Overall prognosis obviously very poor, doxil in palliative attempt, chosen particularly as generally easy to tolerate. She has not wanted to stop all treatment yet, tho Hospice has been discussed.  She is to see me at [facility] on 11-19-15.  2.E coli UTI on admission, blood cultures negative to date. Still complaining of some voiding symptoms, T max 99.7. Check post void bladder scan and renal US. Still on IV zosyn Recent information: completed 14 day course of levaquin and flagyl prior to 10-30-15 Had E coli in pelvic abscesses in 07-2015, GPC in 1/2 blood cultures 08-24-15, MSSA in blood cx at [facility].  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 Bleeding is significant and it would be helpful if any way to improve this. May need additional transfusion on 6-6, Vit K now. Hold prophylactic lovenox. Appreciate GI consult by [name] done 11-09-15, patient reluctant to proceed and risks as noted in Dr Estell Harpin information. 4. Severe iron deficiency anemia; 2 units PRBCs and feraheme since admission. 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 . Will give second feraheme at [facility] next week 5. protein calorie malnutrition: now oral intake only, likes Boost Breeze but otherwise not willing to eat much in hospital - at home ate pop tarts, spaghetti.  6.degenerative arthritis knees very symptomatic prior to all of recent complications 7. DNR discussed and confirmed, advance directives in place 8.social situation: lives alone, equidistant [place] and [place]. Caregiver arranged for several hours daily, [place] nursing and PT involved prior to this admission, set up by  [facility] after surgery. Still very deconditioned. 9.mild-moderate bilateral hydronephrosis by [facility] CT, creatinine stable today. Renal US ordered.  10.hx psoriasis and nonmelanoma skin ca. Hx morbid obesity and previous gastric banding 11. PAC fine for IVs/ blood draws 12.oral thrush previously, resolved with diflucan. Follow with antibiotics 13. Hypokalemia supplementing, improved, follow 14.deconditioning from several months of illness improving. HH PT and RN still very helpful if these can continue at DC  Time spent 40 min including >50% counseling and coordination of care. Please page me if I can help prior to my next rounds/ visit. Pager [phone]  Erin Santiago P

## 2015-11-10 NOTE — Clinical Documentation Improvement (Addendum)
Attending Hospitalist  Please document query responses in the progress notes and discharge summary, not on the [facility]I BPA Report in [facility] Please do not deactivate queries without responding to them. Thank you!  "Sepsis, due to unspecified organism" is documented in the ED provider not 11/08/15.  "Fever/SIRS -Blood pressure stable, lactate reassuring" is documented in the H&P 11/06/15 and subsequent progress notes until 11/08/25 when the documentation changes to "Ecoli UTI -U Cx with >100K EColi, Blood cultures drawn at Cancer center -will request results today, unable to view this."  "Sepsis" is also documented on the current hospital problem list dated 11/06/15 and marked as POA.  Please document if the diagnosis of "Sepsis" has been:  - Ruled Out and is not applicable to this admission, Ecoli UTI is the admission diagnosis.  - Sepsis has been confirmed and is applicable to this admission  - Unable to clinically determine     Please exercise your independent, professional judgment when responding. A specific answer is not anticipated or expected.   Thank You, Erling Conte  RN BSN CCDS [phone] Health Information Management [place]

## 2015-11-10 NOTE — Discharge Summary (Addendum)
Physician Discharge Summary  Erin Santiago [id number] DOB: [birth year] DOA: 11/06/2015  PCP: Merrilee Seashore, MD  Admit date: 11/06/2015 Discharge date: 11/11/2015  Time spent: 51minutes  Recommendations for Outpatient Follow-up:  1. Dr.Livesay in 1 week, will need periodic transfusions likely in 1-2weeks from discharge 2. 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 female with PMH of progressive metastatic endometrial stromal sarcoma extensively involving abdomen and pelvis, metastasis to the spine, and possible lung metastasis She's had several months of hospitalization this year with extremely complicated courses, including February 2 April at [facility] followed by [facility]. 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 She just started palliative chemotherapy after 4 month hiatus, last Friday, she went to the [facility] for blood transfusion and was found to be febrile up to 101, and tachycardic hence she was sent to the emergency room for further evaluation and management. She had chronic diarrhea and chronic baseline  abd discomfort which was unchanged, some dysuria and urinalysis with many bacteria positive nitrite and leuk esterase and numerous WBCs.  Hospital Course:  1. Sepsis due to [facility] UTI -admitted with fever, tachycardia, mild hypotension, Abnormal UA and dysuria -U Cx grew >100K EColi, Blood cultures drawn at Cancer center (not ER) are negative to date, confirmed with Dr.Livesay.(Different interface on EPic) -we also considered an intra-abdominal process/infection could have been brewing, however due to rapid improved, lack of change in Abd symptoms and limited options due to the advanced nature of her underlying cancer and disease progression, and h/o abd abscesses/ surgeries/drains, Myself and Dr.Livesay decided to hold off on ordering a CT Abd pelvis this admission. -last CT 5/18 at [facility] with progression of intra Abd disease, and some hydronephrosis -After 24hours of IV Vanc and Zosyn she was changed to IV ceftriaxone, and will be changed to Po Keflex today for 46more days. -no fevers for >48hours -had hydronephrosis on CT at [facility] 2 weeks ago, Renal US ordered by Dr.Livesay today to FU, pending at this time  2. Widely metastatic endometrial sarcoma with progression of disease in the abdomen/pelvis, metastases to the spine and possibly lung, just started palliative chemotherapy last week (one Rx) -Severely limited in terms of available options given the complexity of her disease -Hospice was discussed last month, patient elected for palliative chemotherapy instead -d/w Dr.Livesay, DNR now  3. Leukocytosis -Baseline WBC count in the 25-30,000 range -40.5 K on admission, with fever, >39K>30K, now back to basal range and afebrile -last CT 5/18 at [facility] with progression of intra Abd disease, and some hydronephrosis  4. Anemia -Due to chronic  disease, malignancy, chemotherapy -Ongoing chronic blood loss related to her small bowel muco-cutaneous fistula -s/p 2units PRBC 6/3 -Dr.Livesay requests GI  input to see if scoping a stoma would be a option, due to ongoing bloody drainage chronically, d/w Dr.Ganem 6/5, appreciate GI consult, Pt would like to hold off on Endoscopy due to the risk of perforation  5. Mild to moderate hydronephrosis noted on last CT abdomen pelvis in [place] on 5/18 -Making good amounts of urine, with her making good amounts of urine at this time doubt intervention/stenting would be required at this time, creatinine stable -FU Renal US pending  6. Mild AKI -Baseline creatinine close to 1, on admission was 1.3 -now stable at 1.2  7. Severe protein calorie malnutrition -Related to progressive malignancy and intra-abdominal complications  8. Chronic diarrhea -due to small bowel resection/surgeries -Cdiff and GI pathogen panel negative -continue lomotil per home regimen  Consultations:  Oncology Dr.Livesay  Gi Dr.Ganem  Discharge Exam: Filed Vitals:   11/09/15 2217 11/10/15 0700  BP: 117/75 125/66  Pulse: 96 92  Temp: 99.2 F (37.3 C) 99.1 F (37.3 C)  Resp: 18 18    General: AAOx3 Cardiovascular: S1S2/RRR Respiratory: Diminished BS at bases  Discharge Instructions   Discharge Instructions    Diet - low sodium heart healthy    Complete by:  As directed      Increase activity slowly    Complete by:  As directed           Current Discharge Medication List    START taking these medications   Details  cephALEXin (KEFLEX) 500 MG capsule Take 1 capsule (500 mg total) by mouth every 8 (eight) hours. For 3days Qty: 12 capsule, Refills: 0    feeding supplement (BOOST / RESOURCE BREEZE) LIQD Take 1 Container by mouth 3 (three) times daily between meals. Qty: 30 Container, Refills: 0      CONTINUE these medications which have CHANGED   Details  potassium chloride (K-DUR) 10 MEQ tablet Take 2 tablets (20 mEq total) by mouth 2 (two) times daily. Qty: 60 tablet, Refills: 0   Associated Diagnoses: Hypokalemia due to loss of potassium       CONTINUE these medications which have NOT CHANGED   Details  acetaminophen (TYLENOL) 500 MG tablet Take 1,000 mg by mouth every 6 (six) hours as needed for moderate pain.    cholestyramine (QUESTRAN) 4 g packet Take 1 packet by mouth 2 (two) times daily.   Associated Diagnoses: Endometrial stromal sarcoma (HCC)    diphenoxylate-atropine (LOMOTIL) 2.5-0.025 MG tablet Take 1 tablet by mouth 2 (two) times daily.   Associated Diagnoses: Endometrial stromal sarcoma (HCC)    ferrous fumarate (HEMOCYTE - 106 MG FE) 325 (106 Fe) MG TABS tablet Take 1 tablet (106 mg of iron total) by mouth daily. Qty: 30 each, Refills: 3   Associated Diagnoses: Endometrial stromal sarcoma (HCC)    lidocaine-prilocaine (EMLA) cream Apply to Porta-cath 1-2 hrs prior to access. Cover with [employer]. Qty: 30 g, Refills: 1   Associated Diagnoses: Endometrial stromal sarcoma (HCC)    ondansetron (ZOFRAN) 8 MG tablet Take 1 tablet (8 mg total) by mouth every 8 (eight) hours as needed for nausea or vomiting. Qty: 30 tablet, Refills: 2    pantoprazole (PROTONIX) 40 MG tablet Take 40 mg by mouth daily.    oxyCODONE (OXY IR/ROXICODONE) 5 MG immediate release tablet Take 1-2 tablets (5-10 mg total) by mouth every 4 (four) hours as needed for moderate  pain, severe pain or breakthrough pain. Qty: 30 tablet, Refills: 0      STOP taking these medications     meloxicam (MOBIC) 15 MG tablet      chlorpheniramine-HYDROcodone (TUSSIONEX PENNKINETIC ER) 10-8 MG/5ML SUER      LORazepam (ATIVAN) 0.5 MG tablet        No Known Allergies Follow-up Information    Follow up with [facility], MD In 1 week.   Specialty:  Internal Medicine   Contact information:   [address] [place] [address] [phone]       Follow up with Gordy Levan, MD. Schedule an appointment as soon as possible for a visit in 1 week.   Specialty:  Oncology   Contact information:   [address] [place]  [place] [address] [phone]        The results of significant diagnostics from this hospitalization (including imaging, microbiology, ancillary and laboratory) are listed below for reference.    Significant Diagnostic Studies: Dg Chest 2 View  11/06/2015  CLINICAL DATA:  Cough and fever, several days duration. Endometrial cancer. EXAM: CHEST  2 VIEW COMPARISON:  08/21/2015 FINDINGS: Power port has its tip in the SVC above the right atrium. Heart size is normal. Mediastinal shadows are normal. The lungs are clear. The vascularity is normal. No effusions. Chronic curvature in degeneration of the spine. IMPRESSION: No active cardiopulmonary disease. Electronically Signed   By: Nelson Chimes M.D.   On: 11/06/2015 14:54    Microbiology: Recent Results (from the past 240 hour(s))  Culture, Blood     Status: None (Preliminary result)   Collection Time: 11/06/15 11:27 AM  Result Value Ref Range Status   BLOOD CULTURE, ROUTINE Preliminary report  Preliminary   RESULT 1 Comment  Preliminary    Comment: No growth in 36 - 48 hours.  TECHNOLOGIST REVIEW     Status: None   Collection Time: 11/06/15 11:34 AM  Result Value Ref Range Status   Technologist Review   Final    Sl toxic granulation, Occ vacuoles and dohle bodies  Culture, Blood     Status: None (Preliminary result)   Collection Time: 11/06/15 11:40 AM  Result Value Ref Range Status   BLOOD CULTURE, ROUTINE Preliminary report  Preliminary   RESULT 1 Comment  Preliminary    Comment: No growth in 36 - 48 hours.  Urine culture     Status: Abnormal   Collection Time: 11/06/15  2:54 PM  Result Value Ref Range Status   Specimen Description URINE, CLEAN CATCH  Final   Special Requests NONE  Final   Culture >=100,000 COLONIES/mL ESCHERICHIA COLI (A)  Final   Report Status 11/08/2015 FINAL  Final   Organism ID, Bacteria ESCHERICHIA COLI (A)  Final      Susceptibility   Escherichia coli - MIC*    AMPICILLIN >=32 RESISTANT Resistant     CEFAZOLIN  <=4 SENSITIVE Sensitive     CEFTRIAXONE <=1 SENSITIVE Sensitive     CIPROFLOXACIN >=4 RESISTANT Resistant     GENTAMICIN <=1 SENSITIVE Sensitive     IMIPENEM <=0.25 SENSITIVE Sensitive     NITROFURANTOIN <=16 SENSITIVE Sensitive     TRIMETH/SULFA <=20 SENSITIVE Sensitive     AMPICILLIN/SULBACTAM >=32 RESISTANT Resistant     PIP/TAZO <=4 SENSITIVE Sensitive     * >=100,000 COLONIES/mL ESCHERICHIA COLI  C difficile quick scan w PCR reflex     Status: None   Collection Time: 11/06/15  4:19 PM  Result  Value Ref Range Status   C Diff antigen NEGATIVE NEGATIVE Final   C Diff toxin NEGATIVE NEGATIVE Final   C Diff interpretation Negative for toxigenic C. difficile  Final  Gastrointestinal Panel by PCR , Stool     Status: None   Collection Time: 11/06/15  4:19 PM  Result Value Ref Range Status   Campylobacter species NOT DETECTED NOT DETECTED Final   Plesimonas shigelloides NOT DETECTED NOT DETECTED Final   Salmonella species NOT DETECTED NOT DETECTED Final   Yersinia enterocolitica NOT DETECTED NOT DETECTED Final   Vibrio species NOT DETECTED NOT DETECTED Final   Vibrio cholerae NOT DETECTED NOT DETECTED Final   Enteroaggregative E coli (EAEC) NOT DETECTED NOT DETECTED Final   Enteropathogenic E coli (EPEC) NOT DETECTED NOT DETECTED Final   Enterotoxigenic E coli (ETEC) NOT DETECTED NOT DETECTED Final   Shiga like toxin producing E coli (STEC) NOT DETECTED NOT DETECTED Final   E. coli O157 NOT DETECTED NOT DETECTED Final   Shigella/Enteroinvasive E coli (EIEC) NOT DETECTED NOT DETECTED Final   Cryptosporidium NOT DETECTED NOT DETECTED Final   Cyclospora cayetanensis NOT DETECTED NOT DETECTED Final   Entamoeba histolytica NOT DETECTED NOT DETECTED Final   Giardia lamblia NOT DETECTED NOT DETECTED Final   Adenovirus F40/41 NOT DETECTED NOT DETECTED Final   Astrovirus NOT DETECTED NOT DETECTED Final   Norovirus GI/GII NOT DETECTED NOT DETECTED Final   Rotavirus A NOT DETECTED NOT DETECTED  Final   Sapovirus (I, II, IV, and V) NOT DETECTED NOT DETECTED Final     Labs: Basic Metabolic Panel:  Recent Labs Lab 11/06/15 1126 11/07/15 0354 11/08/15 0500 11/09/15 0654 11/10/15 0755  NA 132* 129* 134* 135 135  K 3.1* 3.1* 2.8* 2.8* 3.4*  CL  --  104 108 108 110  CO2 19* 18* 17* 18* 17*  GLUCOSE 105 115* 130* 93 104*  BUN 11.4 10 10 8 8   CREATININE 1.2* 1.27* 1.32* 1.20* 1.21*  CALCIUM 8.7 7.3* 7.5* 7.7* 8.0*   Liver Function Tests:  Recent Labs Lab 11/05/15 1202 11/06/15 1126 11/07/15 0354 11/09/15 0654  AST 11 9 12* 8*  ALT 9 9 12* 8*  ALKPHOS 142 126 99 84  BILITOT 1.23* 1.29* 1.3* 1.0  PROT 6.8 6.6 5.8* 5.3*  ALBUMIN 2.6* 2.5* 2.4* 2.2*   No results for input(s): LIPASE, AMYLASE in the last 168 hours. No results for input(s): AMMONIA in the last 168 hours. CBC:  Recent Labs Lab 11/05/15 1202 11/06/15 1134 11/07/15 0354 11/08/15 0500 11/09/15 0654 11/10/15 0755  WBC 48.8* 40.5* 39.6* 30.5* 26.2* 25.8*  NEUTROABS 42.2* 34.3*  --   --   --   --   HGB 8.3* 8.0* 7.7* 8.9* 8.4* 8.5*  HCT 25.6* 23.8* 23.4* 26.4* 25.5* 25.7*  MCV 86.4 85.3 86.3 83.8 86.1 86.8  PLT 437* 304 329 292 351 375   Cardiac Enzymes: No results for input(s): CKTOTAL, CKMB, CKMBINDEX, TROPONINI in the last 168 hours. BNP: BNP (last 3 results) No results for input(s): BNP in the last 8760 hours.  ProBNP (last 3 results) No results for input(s): PROBNP in the last 8760 hours.  CBG: No results for input(s): GLUCAP in the last 168 hours.     SignedDomenic Polite MD.  [facility] 11/10/2015, 1:40 PM

## 2015-11-10 NOTE — Progress Notes (Signed)
Bladder scan performed by NT earlier after void, result was 125 cc. Will continue to monitor.

## 2015-11-11 DIAGNOSIS — D6481 Anemia due to antineoplastic chemotherapy: Secondary | ICD-10-CM

## 2015-11-11 DIAGNOSIS — T451X5A Adverse effect of antineoplastic and immunosuppressive drugs, initial encounter: Secondary | ICD-10-CM

## 2015-11-11 LAB — TYPE AND SCREEN
ABO/RH(D): O POS
Antibody Screen: NEGATIVE
UNIT DIVISION: 0
Unit division: 0

## 2015-11-11 LAB — CBC
HEMATOCRIT: 25.9 % — AB (ref 36.0–46.0)
Hemoglobin: 8.6 g/dL — ABNORMAL LOW (ref 12.0–15.0)
MCH: 29.2 pg (ref 26.0–34.0)
MCHC: 33.2 g/dL (ref 30.0–36.0)
MCV: 87.8 fL (ref 78.0–100.0)
PLATELETS: 386 10*3/uL (ref 150–400)
RBC: 2.95 MIL/uL — ABNORMAL LOW (ref 3.87–5.11)
RDW: 15.9 % — AB (ref 11.5–15.5)
WBC: 22.6 10*3/uL — ABNORMAL HIGH (ref 4.0–10.5)

## 2015-11-11 LAB — BASIC METABOLIC PANEL
Anion gap: 8 (ref 5–15)
BUN: 7 mg/dL (ref 6–20)
CO2: 19 mmol/L — AB (ref 22–32)
Calcium: 8.1 mg/dL — ABNORMAL LOW (ref 8.9–10.3)
Chloride: 108 mmol/L (ref 101–111)
Creatinine, Ser: 1.1 mg/dL — ABNORMAL HIGH (ref 0.44–1.00)
GFR calc Af Amer: >60 mL/min (ref >60)
GFR, EST NON AFRICAN AMERICAN: 53 mL/min — AB (ref >60)
GLUCOSE: 101 mg/dL — AB (ref 65–99)
POTASSIUM: 3.2 mmol/L — AB (ref 3.5–5.1)
Sodium: 135 mmol/L (ref 135–145)

## 2015-11-11 MED ORDER — POTASSIUM CHLORIDE CRYS ER 20 MEQ PO TBCR: 20.0000 meq | EXTENDED_RELEASE_TABLET | Freq: Once | ORAL | Status: DC

## 2015-11-11 MED ORDER — HEPARIN SOD (PORK) LOCK FLUSH 100 UNIT/ML IV SOLN
500.0000 [IU] | INTRAVENOUS | Status: AC | PRN
  Administered 2015-11-11: 500 [IU]
  Filled 2015-11-11: qty 5

## 2015-11-11 NOTE — Progress Notes (Signed)
[facility] PROGRESS NOTE  Patient: Erin Santiago [id number]   PCP: Merrilee Seashore, MD DOB: 03-28-1956   DOA: 11/06/2015   DOS: 11/11/2015    Subjective: Patient denies any acute complaint. No chest pain and abdominal pain no shortness of breath. No diarrhea no constipation. No bleeding. Would like to go home.  Objective:  Filed Vitals:   11/10/15 2200 11/11/15 0602  BP: 135/65 131/82  Pulse: 100 94  Temp: 98.9 F (37.2 C) 98.6 F (37 C)  Resp:  18   BMP Latest Ref Rng 11/11/2015 11/10/2015 11/09/2015  Glucose 65 - 99 mg/dL 101(H) 104(H) 93  BUN 6 - 20 mg/dL 7 8 8   Creatinine 0.44 - 1.00 mg/dL 1.10(H) 1.21(H) 1.20(H)  Sodium 135 - 145 mmol/L 135 135 135  Potassium 3.5 - 5.1 mmol/L 3.2(L) 3.4(L) 2.8(L)  Chloride 101 - 111 mmol/L 108 110 108  CO2 22 - 32 mmol/L 19(L) 17(L) 18(L)  Calcium 8.9 - 10.3 mg/dL 8.1(L) 8.0(L) 7.7(L)    Assessment and plan: Patient appears to be stable to be discharged home. Please refer to the discharge summary dictated by Dr. Broadus John on 11/10/2015. Patient will follow up with Dr. Marko Plume on 11/19/2015 for transfusion. Home health PT RN arranged.  Renal hydronephrosis appears to be improving from earlier CT scan. Will need outpatient monitoring  Author: Berle Mull, MD [facility] Pager: [phone] 11/11/2015 11:52 AM   If 7PM-7AM, please contact night-coverage at [email or web address], password [facility]

## 2015-11-11 NOTE — Progress Notes (Signed)
Medical Oncology November 11, 2015, 10:50 AM  Hospital day 6 Antibiotics: zosyn DCd 6-6, Keflex begun 6-6 Chemotherapy: day 13 cycle 1 doxil  Outpatient Physicians: [name]/ [name]/ John Soper [facility]), [name], [name] [facility]),[name] (PCP [facility]), [name]; [name], [name] 11-09-15 Dr Anson Fret  Subjective: Patient awake, alert, anxious to go home. Tolerating oral Keflex without difficulty. 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 Feels she was sedated from tessalon perles tried yesterday for cough. Ate pancakes, banana pudding yesterday, drinkng Boost Breeze. No other bleeding. No new or different pain. Up to BR without assistance. Voiding ok, no longer any discomfort with that. No problems with PAC.   ONCOLOGIC HISTORY Patient had heavy postmenopausal bleeding Sept 2015, then lesser bleeding over next several months until she was seen in 06-2014 by [name]. Hemoglobin was 12.7 on 07-04-14. CT CAP 06-25-14 had uterus 16.4 x 13.5 x 14.4 cm with marked expansion of endometrial canal by heterogeneous mass, bilateral pelvic sidewall adenopathy, iliac adenopathy with no definite retroperitoneal or mesenteric adenopathy, no ascites, no liver mets. Attempted endometrial sampling 06-26-14 and 07-03-14 was nondiagnostic; around that time the patient was passing clots and using one large maxipad hourly. She was seen by [name] 07-05-14, with uterine fundus palpable above umbilicus. She had surgery by [name] at [facility] on 07-14-14, which was exploratory laparotomy with TAH BSO, bilateral total pelvic lymphadenectomy and sampling of aortic and renal nodes. 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  Case was presented at [facility] multidisciplinary conference 07-23-14, with recommendation for PET CT to evaluate inguinal and portahepatis nodes, and to consider adjuvant gemzar taxotere and possibly follow with 4 cycles of adriamycin, then to consider whole pelvic RT. She saw [name] for post op follow up on 07-28-14, with recommendation for gemzar taxotere and adriamycin, whole pelvic RT after chemo and consideration of Megace maintenance after chemo (possibly prior to negative ER PR information). PET 08-01-14 had some uptake in aortocaval and left paraaortic regions. She had day 1 cycle 1 gemzar taxotere on 08-28-14, day 8 cycle 1 on 09-04-14 and neulasta on 09-06-14. Admitted with neutropenic fever on day 14 cycle 1, with oral mucositis and some diarrhea. Counts maintained cycle 2 using OnPro neulasta day 9. She had progressive LE swelling after cycle 3, with venous dopplers negative 10-23-14, then LE swelling and SOB so marked after day 1 cycle 4 10-30-14 that chemo was held. Restaging CT AP + CXR 11-10-14 had stable tiny left pulmonary nodule/ no pleural effusion and normal heart size; CT had necrotic aortocaval adenopathy, iliac adenopathy L>R and 7x10 cm fluid collection in pelvis. She received IMRT 45 gray in 25 fractions to pelvis from 6-27 thru 01-05-15, with resolution of LE swelling by completion of course. CT CAP 02-03-15 showed improvement in retroperitoneal and pelvic involvement. She went onto observation after RT, plan to wait until clear progressive disease before resuming treatment, possibly adriamycin vs on study. CT CAP 05-07-15 showed new lytic lesion at T4, stable 2-3 mm pulmonary nodules, increased left pelvic and retroperitoneal nodes and RLQ peritoneal nodule. Radiation therapy summary as follows: Indication for treatment: A new bone lesion in the right side of the T4 vertebral body, with soft tissue extension slightly  impinging upon the central spinal canal, some pain from this lesion Radiation  treatment dates: 05/18/2015 through 06/05/2015 Site/dose: T4 thoracic spine area, 35 gray in 14 fractions She was not eligible for any of the arms of MATCH trial by evaluation in Jan 2017. Plan was to begin treatment with adriamycin + olaratumab, however this not begun prior to hospitalization 07-30-15 with abdominal/ pelvic abscess. She was hospitalized at [facility] from 2-23 thru 08-27-15 with bowel fistulas and abdominopelvic abscesses. She was transferred to [facility] on 08-27-15 and remained hospitalized there thru ~ 09-28-15, with 2 surgeries for bowel involvement with malignancy. IR drains fell out in 10-2015, with CT AP [facility] 10-22-15 showing progression of extensive intraabdominal disease. She had follow up at 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 Patient requested trying further chemo, first doxil given 10-30-15.   Objective: Vital signs in last 24 hours: Blood pressure 131/82, pulse 94, temperature 98.6 F (37 C), temperature source Oral, resp. rate 18, height 5' (1.524 m), weight 190 lb (86.183 kg), last menstrual period 02/04/2014, SpO2 97 %. Awake, alert, respirations not labored supine on RA. Oral mucosa slightly dry without thrush or lesions. PERRL. Lungs without wheezes or rales. PAC still accessed, locked, site ok. Heart RRR. Abdomen soft, not more distended, some BS. Bloody fluid in mucous fistula bag as noted above. LE without pitting edema, cords, tenderness. Skin not remarkable. Moves all extremities easily.   Intake/Output from previous day: 06/06 0701 - 06/07 0700 In: 420 [P.O.:420] Out: 260 [Urine:260] Intake/Output this shift: Total I/O In: 360 [P.O.:360] Out: -     Lab Results:  Recent Labs  11/10/15 0755 11/11/15 0500  WBC 25.8* 22.6*  HGB 8.5* 8.6*  HCT 25.7* 25.9*  PLT 375 386   BMET  Recent Labs  11/10/15 0755  11/11/15 0500  NA 135 135  K 3.4* 3.2*  CL 110 108  CO2 17* 19*  GLUCOSE 104* 101*  BUN 8 7  CREATININE 1.21* 1.10*  CALCIUM 8.0* 8.1*    Studies/Results: US Renal  11/10/2015  CLINICAL DATA:  Followup hydronephrosis. EXAM: RENAL / URINARY TRACT ULTRASOUND COMPLETE COMPARISON:  CT scan 08/22/2015 FINDINGS: Right Kidney: Length: 12.5 cm. Normal renal cortical thickness and echogenicity without focal lesions or hydronephrosis. Left Kidney: Length: 12.3 cm. Normal renal cortical thickness and echogenicity. Mild to moderate hydronephrosis. No obvious renal calculi. Bladder: Normal. Other: Midline pelvic fluid collection noted measuring 8.3 x 8.2 x 7.6 cm. This may reflect persistent abscess noted on prior CTs. Followup CT may be helpful for further evaluation. IMPRESSION: Mild to moderate left-sided hydronephrosis. 8 cm midline pelvic fluid collection. Electronically Signed   By: Marijo Sanes M.D.   On: 11/10/2015 14:25   DISCUSSION: Reviewed findings on renal US as above, seems to be some better than what was seen on last [facility] CT and will follow without other intervention needed presently.  She will get second feraheme at time of my appointment on 11-19-15, with labs rechecked that day. Doxil is every 4 weeks.   Assessment/Plan: 1. 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 No treatment available on MATCH trial thru [facility]. Plan had been for adriamycin + olaratumab in Feb, not begun due to interval bowel perforation(s) with abdominal and pelvic abscesses. Now post surgeries x 2 with entero- enterostomy and mucous  fistula. She initially improved from very complicated 2 month hospitalization,and at her request was given doxil (dose reduced) on 10-30-15 in palliative attempt. Had CT AP at [facility] 10-22-15 as above. IR drains at [facility] were not successful. Overall prognosis obviously very poor, doxil in palliative  attempt, chosen particularly as generally easy to tolerate. She has not wanted to stop all treatment yet, tho Hospice has been discussed.  She is to see me at [facility] on 11-19-15.  2.E coli UTI on admission, blood cultures negative to date. Now on oral antibiotic, afebrile and voiding symptoms resolved.   Recent information: completed 14 day course of levaquin and flagyl prior to 10-30-15 Had E coli in pelvic abscesses in 07-2015, GPC in 1/2 blood cultures 08-24-15, MSSA in blood cx at [facility].  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 Appreciate GI consult by [name] done 11-09-15, patient reluctant to proceed and risks as noted in Dr Estell Harpin information. Will give second feraheme outpatient and will transfuse outpatient as needed.  4. Severe iron deficiency anemia; 2 units PRBCs and feraheme since admission. 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 . Will give second feraheme at [facility] next week 5. protein calorie malnutrition: likes Boost Breeze, good nutrition emphasized again 6.degenerative arthritis knees very symptomatic prior to all of recent complications 7. DNR discussed and confirmed, advance directives in place 8.social situation: lives alone, equidistant [place] and [place]. Caregiver arranged for several hours daily, [place] nursing and PT involved prior to this admission, set up by [facility] after surgery. Still very deconditioned. 9.Renal US shows some left hydronephrosis only, bladder ok, this may be better than the mild-moderate bilateral hydronephrosis by [facility] CT5-18-17, creatinine stable today.  10.hx psoriasis and nonmelanoma skin ca. Hx morbid obesity and previous gastric banding 11. PAC in 12.oral thrush previously, resolved with diflucan. Follow with antibiotics, none now 13. Hypokalemia supplementing, improved, follow 14.deconditioning from several months of illness improving. HH PT and RN still very helpful if  these can continue at DC  Please page me if questions prior to DC  [phone] Appreciate excellent care by hospitalist team  Amber Williard P

## 2015-11-11 NOTE — Progress Notes (Signed)
Discharge summary, HHPT/RN orders and face to face faxed to [facility] with confirmation of success. [phone]). Marney Doctor RN,BSN,NCM [phone]

## 2015-11-11 NOTE — Progress Notes (Signed)
Patient d/c home, patient is stable,no c/o pain.

## 2015-11-11 NOTE — Progress Notes (Signed)
Patient's d/c instructions given, prescriptions handed to patient, teach back utilized, verbalized understanding. Denies pain. Portacath flushed per protocol, site unremarkable.

## 2015-11-11 NOTE — Progress Notes (Signed)
PT Cancellation Note  Patient Details Name: Erin Santiago MRN: [id number] DOB: July 24, 1955   Cancelled Treatment:     Pt declined stated "I'm going home today" and "I can walk".     Rica Koyanagi  PTA [facility]  Acute  Rehab Pager      [phone]

## 2015-11-11 NOTE — Progress Notes (Signed)
Patient refused extra dose of potassium, Dr. Posey Pronto aware.

## 2015-11-12 LAB — CULTURE, BLOOD (SINGLE)

## 2015-11-18 ENCOUNTER — Other Ambulatory Visit: Payer: Self-pay | Admitting: Oncology

## 2015-11-18 DIAGNOSIS — C541 Malignant neoplasm of endometrium: Secondary | ICD-10-CM

## 2015-11-19 ENCOUNTER — Ambulatory Visit (HOSPITAL_BASED_OUTPATIENT_CLINIC_OR_DEPARTMENT_OTHER): Payer: Federal, State, Local not specified - PPO

## 2015-11-19 ENCOUNTER — Ambulatory Visit (HOSPITAL_BASED_OUTPATIENT_CLINIC_OR_DEPARTMENT_OTHER): Payer: Federal, State, Local not specified - PPO | Admitting: Oncology

## 2015-11-19 ENCOUNTER — Ambulatory Visit: Payer: Federal, State, Local not specified - PPO

## 2015-11-19 ENCOUNTER — Other Ambulatory Visit (HOSPITAL_BASED_OUTPATIENT_CLINIC_OR_DEPARTMENT_OTHER): Payer: Federal, State, Local not specified - PPO

## 2015-11-19 ENCOUNTER — Encounter: Payer: Self-pay | Admitting: Oncology

## 2015-11-19 VITALS — BP 124/65 | HR 86 | Resp 18

## 2015-11-19 VITALS — BP 129/80 | HR 95 | Temp 98.6°F | Resp 18 | Ht 60.0 in | Wt 185.0 lb

## 2015-11-19 DIAGNOSIS — Z452 Encounter for adjustment and management of vascular access device: Secondary | ICD-10-CM

## 2015-11-19 DIAGNOSIS — K922 Gastrointestinal hemorrhage, unspecified: Secondary | ICD-10-CM

## 2015-11-19 DIAGNOSIS — C7989 Secondary malignant neoplasm of other specified sites: Secondary | ICD-10-CM

## 2015-11-19 DIAGNOSIS — D509 Iron deficiency anemia, unspecified: Secondary | ICD-10-CM

## 2015-11-19 DIAGNOSIS — Z95828 Presence of other vascular implants and grafts: Secondary | ICD-10-CM

## 2015-11-19 DIAGNOSIS — C7951 Secondary malignant neoplasm of bone: Secondary | ICD-10-CM

## 2015-11-19 DIAGNOSIS — C772 Secondary and unspecified malignant neoplasm of intra-abdominal lymph nodes: Secondary | ICD-10-CM

## 2015-11-19 DIAGNOSIS — E876 Hypokalemia: Secondary | ICD-10-CM | POA: Diagnosis not present

## 2015-11-19 DIAGNOSIS — C541 Malignant neoplasm of endometrium: Secondary | ICD-10-CM | POA: Diagnosis not present

## 2015-11-19 DIAGNOSIS — Z79899 Other long term (current) drug therapy: Secondary | ICD-10-CM

## 2015-11-19 DIAGNOSIS — D500 Iron deficiency anemia secondary to blood loss (chronic): Secondary | ICD-10-CM

## 2015-11-19 DIAGNOSIS — R5381 Other malaise: Secondary | ICD-10-CM

## 2015-11-19 LAB — COMPREHENSIVE METABOLIC PANEL
ALBUMIN: 2.8 g/dL — AB (ref 3.5–5.0)
ALK PHOS: 159 U/L — AB (ref 40–150)
ALT: 21 U/L (ref 0–55)
ANION GAP: 11 meq/L (ref 3–11)
AST: 13 U/L (ref 5–34)
BUN: 10.3 mg/dL (ref 7.0–26.0)
CALCIUM: 9 mg/dL (ref 8.4–10.4)
CHLORIDE: 109 meq/L (ref 98–109)
CO2: 17 mEq/L — ABNORMAL LOW (ref 22–29)
CREATININE: 1.2 mg/dL — AB (ref 0.6–1.1)
EGFR: 50 mL/min/{1.73_m2} — ABNORMAL LOW (ref >90)
Glucose: 92 mg/dl (ref 70–140)
POTASSIUM: 3.4 meq/L — AB (ref 3.5–5.1)
Sodium: 137 mEq/L (ref 136–145)
Total Bilirubin: 0.78 mg/dL (ref 0.20–1.20)
Total Protein: 6.8 g/dL (ref 6.4–8.3)

## 2015-11-19 LAB — CBC WITH DIFFERENTIAL/PLATELET
BASO%: 0.5 % (ref 0.0–2.0)
BASOS ABS: 0.1 10*3/uL (ref 0.0–0.1)
EOS ABS: 3.4 10*3/uL — AB (ref 0.0–0.5)
EOS%: 13.3 % — AB (ref 0.0–7.0)
HEMATOCRIT: 32.3 % — AB (ref 34.8–46.6)
HEMOGLOBIN: 10.3 g/dL — AB (ref 11.6–15.9)
LYMPH#: 0.4 10*3/uL — AB (ref 0.9–3.3)
LYMPH%: 1.6 % — ABNORMAL LOW (ref 14.0–49.7)
MCH: 28.2 pg (ref 25.1–34.0)
MCHC: 32 g/dL (ref 31.5–36.0)
MCV: 88.2 fL (ref 79.5–101.0)
MONO#: 1.1 10*3/uL — AB (ref 0.1–0.9)
MONO%: 4.2 % (ref 0.0–14.0)
NEUT#: 20.7 10*3/uL — ABNORMAL HIGH (ref 1.5–6.5)
NEUT%: 80.4 % — AB (ref 38.4–76.8)
PLATELETS: 442 10*3/uL — AB (ref 145–400)
RBC: 3.67 10*6/uL — ABNORMAL LOW (ref 3.70–5.45)
RDW: 16.1 % — AB (ref 11.2–14.5)
WBC: 25.7 10*3/uL — ABNORMAL HIGH (ref 3.9–10.3)

## 2015-11-19 LAB — URINALYSIS, MICROSCOPIC - CHCC
BILIRUBIN (URINE): NEGATIVE
BLOOD: NEGATIVE
GLUCOSE UR CHCC: NEGATIVE mg/dL
Ketones: NEGATIVE mg/dL
NITRITE: NEGATIVE
PH: 6 (ref 4.6–8.0)
PROTEIN: 100 mg/dL
SPECIFIC GRAVITY, URINE: 1.025 (ref 1.003–1.035)
Urobilinogen, UR: 0.2 mg/dL (ref 0.2–1)

## 2015-11-19 LAB — TECHNOLOGIST REVIEW

## 2015-11-19 MED ORDER — SODIUM CHLORIDE 0.9 % IJ SOLN
10.0000 mL | INTRAMUSCULAR | Status: DC | PRN
  Administered 2015-11-19: 10 mL via INTRAVENOUS
  Filled 2015-11-19: qty 10

## 2015-11-19 MED ORDER — HEPARIN SOD (PORK) LOCK FLUSH 100 UNIT/ML IV SOLN
500.0000 [IU] | Freq: Once | INTRAVENOUS | Status: AC | PRN
  Administered 2015-11-19: 500 [IU] via INTRAVENOUS
  Filled 2015-11-19: qty 5

## 2015-11-19 MED ORDER — SODIUM CHLORIDE 0.9 % IV SOLN
510.0000 mg | Freq: Once | INTRAVENOUS | Status: AC
  Administered 2015-11-19: 510 mg via INTRAVENOUS
  Filled 2015-11-19: qty 17

## 2015-11-19 NOTE — Patient Instructions (Signed)

## 2015-11-19 NOTE — Progress Notes (Signed)
OFFICE PROGRESS NOTE   November 19, 2015   Physicians: [name]/ [name]/ [name] [facility]), [name], [name] [facility] sarcoma clinic),[name] (PCP [facility]), [name]; [name], [name] 11-09-15 Dr Anson Fret  INTERVAL HISTORY:  Patient is seen, alone for visit / brought to office by friend, in continuing attention to metastatic endometrial stromal sarcoma extensively involving abdomen and pelvis. She has had very complicated course since Feb '17, most recently again hospitalized 6-2 thru 11-11-15. First doxil was 10-30-15 She will have second feraheme today.   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 GI consult in hospital felt that endoscopic evaluation of mucous fistula would be high risk and probably not beneficial. She was transfused 2 units PRBCs on 11-07-15 for hgb ~ 8.3 and had first feraheme in hospital.  She has completed antibiotics, no fever or chills, no bladder symptoms and repeat urine sent today to be sure cleared. 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 She has felt stronger since PRBCs and first feraheme. Appetite is better, tho had abdominal pain and diarrhea after liverwurst. HH RN is seeing her 2x weekly and HH PT 3x weekly, ambulating better. Denies SOB, other new or different pain, no change chronic arthritis, no problems with PAC, no other bleeding. No skin irritation from first doxil. Bowels formed today, were loose after change in diet as above Remainder of 10 point Review of Systems negative.  PAC in feraheme 11-08-15 and 11-19-15  ONCOLOGIC HISTORY Patient had heavy postmenopausal bleeding Sept 2015, then lesser bleeding over next several months until she was seen in 06-2014 by [name]. Hemoglobin  was 12.7 on 07-04-14. CT CAP 06-25-14 had uterus 16.4 x 13.5 x 14.4 cm with marked expansion of endometrial canal by heterogeneous mass, bilateral pelvic sidewall adenopathy, iliac adenopathy with no definite retroperitoneal or mesenteric adenopathy, no ascites, no liver mets. Attempted endometrial sampling 06-26-14 and 07-03-14 was nondiagnostic; around that time the patient was passing clots and using one large maxipad hourly. She was seen by [name] 07-05-14, with uterine fundus palpable above umbilicus. She had surgery by [name] at [facility] on 07-14-14, which was exploratory laparotomy with TAH BSO, bilateral total pelvic lymphadenectomy and sampling of aortic and renal nodes. 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 Case was presented at [facility] multidisciplinary conference 07-23-14, with recommendation for PET CT to evaluate inguinal and portahepatis nodes, and to consider adjuvant gemzar taxotere and possibly follow with 4 cycles of adriamycin, then to consider whole pelvic RT. She saw [name] for post op follow up on 07-28-14, with recommendation for gemzar taxotere and adriamycin, whole pelvic RT after chemo and consideration of Megace maintenance after chemo (possibly prior to negative ER PR information). PET 08-01-14 had some uptake in aortocaval and left paraaortic regions. She had day 1 cycle 1 gemzar taxotere on 08-28-14, day 8 cycle 1 on 09-04-14 and neulasta on 09-06-14. Admitted with neutropenic fever on day 14 cycle 1, with oral mucositis and some diarrhea. Counts maintained cycle 2 using OnPro neulasta day 9. She had progressive LE swelling after cycle 3, with venous dopplers negative 10-23-14, then LE swelling and SOB so marked  after day 1 cycle 4 10-30-14 that chemo was held. Restaging CT AP + CXR 11-10-14 had stable tiny left pulmonary nodule/ no pleural effusion and  normal heart size; CT had necrotic aortocaval adenopathy, iliac adenopathy L>R and 7x10 cm fluid collection in pelvis. She received IMRT 45 gray in 25 fractions to pelvis from 6-27 thru 01-05-15, with resolution of LE swelling by completion of course. CT CAP 02-03-15 showed improvement in retroperitoneal and pelvic involvement. She went onto observation after RT, plan to wait until clear progressive disease before resuming treatment, possibly adriamycin vs on study. CT CAP 05-07-15 showed new lytic lesion at T4, stable 2-3 mm pulmonary nodules, increased left pelvic and retroperitoneal nodes and RLQ peritoneal nodule. Radiation therapy summary as follows: Indication for treatment: A new bone lesion in the right side of the T4 vertebral body, with soft tissue extension slightly impinging upon the central spinal canal, some pain from this lesion Radiation treatment dates: 05/18/2015 through 06/05/2015 Site/dose: T4 thoracic spine area, 35 gray in 14 fractions She was not eligible for any of the arms of MATCH trial by evaluation in Jan 2017. Plan was to begin treatment with adriamycin + olaratumab, however this not begun prior to hospitalization 07-30-15 with abdominal/ pelvic abscess. She was hospitalized at [facility] from 2-23 thru 08-27-15 with bowel fistulas and abdominopelvic abscesses. She was transferred to [facility] on 08-27-15 and remained hospitalized there thru ~ 09-28-15, with 2 surgeries for bowel involvement with malignancy. IR drains fell out in 10-2015, with CT AP [facility] 10-22-15 showing progression of extensive intraabdominal disease. 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 Patient requested trying further chemo, first doxil given 10-30-15. Hospitalized for E coli UTI and bleeding mucous fistula 6-2 thru 11-11-15, GI consult in hospital did not  recommend endoscopic evaluation of mucous fistula.    Objective:  Vital signs in last 24 hours:  BP 129/80 mmHg  Pulse 95  Temp(Src) 98.6 F (37 C) (Oral)  Resp 18  Ht 5' (1.524 m)  Wt 185 lb (83.915 kg)  BMI 36.13 kg/m2  SpO2 100%  LMP 02/04/2014  Alert, oriented and appropriate. In [facility] for office, but ambulatory at home with walker . Color better, looks much more comfortable and more energetic today. Respirations not labored RA.  Chronic partial alopecia  HEENT:PERRL, sclerae not icteric. Oral mucosa moist without lesions, posterior pharynx clear.  Neck supple. No JVD.  Lymphatics:no cervical,supraclavicular adenopathy Resp: clear to auscultation bilaterally and normal percussion bilaterally Cardio: regular rate and rhythm. No gallop. GI: abdomen obese, soft, nontender, not distended, no mass or organomegaly. Normally active bowel sounds. Mucous fistula slight oosing serosanguinous fluid, with ~ 20 cc thin bloody fluid in bag which was changed this AM Musculoskeletal/ Extremities: trace pedal edema bilaterally without cords, tenderness Neuro: no peripheral neuropathy. Otherwise nonfocal on exam in WC. PSYCH brighter mood and affect, joking Skin without rash, ecchymosis, petechiae. Palms ok Portacath-without erythema or tenderness  Lab Results:  Results for orders placed or performed in visit on 11/19/15  TECHNOLOGIST REVIEW  Result Value Ref Range   Technologist Review occ Metas and Myelocytes present   CBC with Differential  Result Value Ref Range   WBC 25.7 (H) 3.9 - 10.3 10e3/uL   NEUT# 20.7 (H) 1.5 - 6.5 10e3/uL   HGB 10.3 (L) 11.6 - 15.9 g/dL   HCT 32.3 (  L) 34.8 - 46.6 %   Platelets 442 (H) 145 - 400 10e3/uL   MCV 88.2 79.5 - 101.0 fL   MCH 28.2 25.1 - 34.0 pg   MCHC 32.0 31.5 - 36.0 g/dL   RBC 3.67 (L) 3.70 - 5.45 10e6/uL   RDW 16.1 (H) 11.2 - 14.5 %   lymph# 0.4 (L) 0.9 - 3.3 10e3/uL   MONO# 1.1 (H) 0.1 - 0.9 10e3/uL   Eosinophils Absolute 3.4 (H) 0.0 - 0.5  10e3/uL   Basophils Absolute 0.1 0.0 - 0.1 10e3/uL   NEUT% 80.4 (H) 38.4 - 76.8 %   LYMPH% 1.6 (L) 14.0 - 49.7 %   MONO% 4.2 0.0 - 14.0 %   EOS% 13.3 (H) 0.0 - 7.0 %   BASO% 0.5 0.0 - 2.0 %  Comprehensive metabolic panel  Result Value Ref Range   Sodium 137 136 - 145 mEq/L   Potassium 3.4 (L) 3.5 - 5.1 mEq/L   Chloride 109 98 - 109 mEq/L   CO2 17 (L) 22 - 29 mEq/L   Glucose 92 70 - 140 mg/dl   BUN 10.3 7.0 - 26.0 mg/dL   Creatinine 1.2 (H) 0.6 - 1.1 mg/dL   Total Bilirubin 0.78 0.20 - 1.20 mg/dL   Alkaline Phosphatase 159 (H) 40 - 150 U/L   AST 13 5 - 34 U/L   ALT 21 0 - 55 U/L   Total Protein 6.8 6.4 - 8.3 g/dL   Albumin 2.8 (L) 3.5 - 5.0 g/dL   Calcium 9.0 8.4 - 10.4 mg/dL   Anion Gap 11 3 - 11 mEq/L   EGFR 50 (L) >90 ml/min/1.73 m2  Urinalysis with microscopic  Result Value Ref Range   Glucose Negative Negative mg/dL   Bilirubin (Urine) Negative Negative   Ketones Negative Negative mg/dL   Specific Gravity, Urine 1.025 1.003 - 1.035   Blood Negative Negative   pH 6.0 4.6 - 8.0   Protein 100 Negative- <30 mg/dL   Urobilinogen, UR 0.2 0.2 - 1 mg/dL   Nitrite Negative Negative   Leukocyte Esterase Trace Negative   RBC / HPF 0-2 0 - 2   WBC, UA 0-2 0 - 2   Bacteria, UA Moderate Negative- Trace   Casts Granular Negative   Epithelial Cells Moderate Negative- Few   Amorphous, UA Small Negative- Small    Urine culture pending Studies/Results:  No results found.  Medications: I have reviewed the patient's current medications.  DISCUSSION She is in agreement with second feraheme today and would like to proceed with second doxil on 6-26 due to transportation. Reviewed carefully need to use lots of lotion and skin care especially for several days after doxil, as skin effects are cumulative. Appreciate extra assistance from [facility] for as long as that is available.  Assessment/Plan:  1. 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 No treatment available on MATCH trial thru [facility]. Plan had been for adriamycin + olaratumab in Feb, not begun due to interval bowel perforation(s) with abdominal and pelvic abscesses. Now post surgeries x 2 with entero- enterostomy and mucous fistula. She initially improved from very complicated 2 month hospitalization,and at her request was given doxil (dose reduced) on 10-30-15 in palliative attempt. IR drains at [facility] were not successful. Overall prognosis obviously very poor, doxil in palliative attempt and tolerated cycle 1 well. Overall some improvement in last 2 weeks with other supportive interventions. Will give cycle 2  doxil on 11-30-15 if stable.  She has not wanted to stop all treatment yet, tho Hospice has been discussed. HH in place for now.  2.E coli UTI at recent hospital admission: clinically resolved, will confirm with urine studies now  Recent information: completed 14 day course of levaquin and flagyl prior to 10-30-15 Had E coli in pelvic abscesses in 07-2015, GPC in 1/2 blood cultures 08-24-15, MSSA in blood cx at [facility].  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 Appreciate GI consult by [name] done 11-09-15, patient reluctant to proceed and risks as noted in Dr Estell Harpin information. Will give second feraheme outpatient and will transfuse outpatient as needed.  4. Severe iron deficiency anemia; 2 units PRBCs and feraheme during recent admission. 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 Second feraheme today. She also had Vit K in hospital for slightly prolonged INR  5. protein calorie malnutrition: likes Boost Breeze, good nutrition emphasized again 6.degenerative arthritis knees very symptomatic prior to all of recent complications 7. DNR discussed and confirmed, advance directives in place 8.social situation: lives alone, equidistant [place] and [place]. Caregiver arranged for several  hours daily, HH nursing and PT involved. Still very deconditioned. 9.Renal US 6-6 showed some left hydronephrosis only, bladder ok, this may be better than the mild-moderate bilateral hydronephrosis by [facility] CT5-18-17, creatinine stable today.  10.hx psoriasis and nonmelanoma skin ca. Hx morbid obesity and previous gastric banding 11. PAC in 12.oral thrush previously, resolved with diflucan. 13. Hypokalemia supplementing, was not using oral K as instructed and will increase that now   All questions answered and she knows to call if concerns prior to scheduled appointments. Feraheme orders in and managed care notified prior to visit today,  Doxil orders placed. Time spent 30 min including >50% counseling and coordination of care. Route PCP, in EPIC and CareEverywhere for other MDs.    LIVESAY,LENNIS P, MD   11/19/2015, 3:45 PM

## 2015-11-19 NOTE — Patient Instructions (Signed)

## 2015-11-20 LAB — URINE CULTURE: ORGANISM ID, BACTERIA: NO GROWTH

## 2015-11-24 ENCOUNTER — Telehealth: Payer: Self-pay

## 2015-11-24 NOTE — Telephone Encounter (Signed)
LM in patient's VM stating results of the urine culture as noted below by Dr. Marko Plume.

## 2015-11-24 NOTE — Telephone Encounter (Signed)
-----   Message from Gordy Levan, MD sent at 11/22/2015  9:20 AM EDT ----- Labs seen and need follow up: please let her know culture negative

## 2015-11-30 ENCOUNTER — Ambulatory Visit (HOSPITAL_BASED_OUTPATIENT_CLINIC_OR_DEPARTMENT_OTHER): Payer: Federal, State, Local not specified - PPO

## 2015-11-30 ENCOUNTER — Ambulatory Visit: Payer: Federal, State, Local not specified - PPO | Admitting: Nutrition

## 2015-11-30 ENCOUNTER — Other Ambulatory Visit (HOSPITAL_BASED_OUTPATIENT_CLINIC_OR_DEPARTMENT_OTHER): Payer: Federal, State, Local not specified - PPO

## 2015-11-30 ENCOUNTER — Ambulatory Visit: Payer: Federal, State, Local not specified - PPO

## 2015-11-30 ENCOUNTER — Encounter: Payer: Self-pay | Admitting: Oncology

## 2015-11-30 ENCOUNTER — Telehealth: Payer: Self-pay | Admitting: Oncology

## 2015-11-30 ENCOUNTER — Other Ambulatory Visit: Payer: Self-pay | Admitting: Oncology

## 2015-11-30 ENCOUNTER — Other Ambulatory Visit: Payer: Self-pay | Admitting: *Deleted

## 2015-11-30 VITALS — BP 115/57 | HR 102 | Temp 99.7°F | Resp 20

## 2015-11-30 DIAGNOSIS — E876 Hypokalemia: Secondary | ICD-10-CM

## 2015-11-30 DIAGNOSIS — C541 Malignant neoplasm of endometrium: Secondary | ICD-10-CM

## 2015-11-30 DIAGNOSIS — Z95828 Presence of other vascular implants and grafts: Secondary | ICD-10-CM

## 2015-11-30 DIAGNOSIS — N390 Urinary tract infection, site not specified: Secondary | ICD-10-CM

## 2015-11-30 DIAGNOSIS — C772 Secondary and unspecified malignant neoplasm of intra-abdominal lymph nodes: Secondary | ICD-10-CM

## 2015-11-30 LAB — COMPREHENSIVE METABOLIC PANEL
ALT: 10 U/L (ref 0–55)
AST: 8 U/L (ref 5–34)
Albumin: 2.7 g/dL — ABNORMAL LOW (ref 3.5–5.0)
Alkaline Phosphatase: 108 U/L (ref 40–150)
Anion Gap: 9 mEq/L (ref 3–11)
BILIRUBIN TOTAL: 1.17 mg/dL (ref 0.20–1.20)
BUN: 10.4 mg/dL (ref 7.0–26.0)
CHLORIDE: 103 meq/L (ref 98–109)
CO2: 21 meq/L — AB (ref 22–29)
CREATININE: 1.1 mg/dL (ref 0.6–1.1)
Calcium: 8.8 mg/dL (ref 8.4–10.4)
EGFR: 52 mL/min/{1.73_m2} — ABNORMAL LOW (ref >90)
GLUCOSE: 109 mg/dL (ref 70–140)
Potassium: 2.7 mEq/L — CL (ref 3.5–5.1)
SODIUM: 134 meq/L — AB (ref 136–145)
TOTAL PROTEIN: 6.4 g/dL (ref 6.4–8.3)

## 2015-11-30 LAB — URINALYSIS, MICROSCOPIC - CHCC
Bilirubin (Urine): NEGATIVE
Blood: NEGATIVE
Glucose: NEGATIVE mg/dL
KETONES: NEGATIVE mg/dL
Leukocyte Esterase: NEGATIVE
Nitrite: NEGATIVE
PROTEIN: 100 mg/dL
RBC / HPF: NEGATIVE (ref 0–2)
SPECIFIC GRAVITY, URINE: 1.025 (ref 1.003–1.035)
Urobilinogen, UR: 0.2 mg/dL (ref 0.2–1)
pH: 6 (ref 4.6–8.0)

## 2015-11-30 LAB — CBC WITH DIFFERENTIAL/PLATELET
BASO%: 0.2 % (ref 0.0–2.0)
Basophils Absolute: 0.1 10*3/uL (ref 0.0–0.1)
EOS ABS: 3 10*3/uL — AB (ref 0.0–0.5)
EOS%: 6.5 % (ref 0.0–7.0)
HCT: 29.7 % — ABNORMAL LOW (ref 34.8–46.6)
HGB: 9.8 g/dL — ABNORMAL LOW (ref 11.6–15.9)
LYMPH%: 2.8 % — AB (ref 14.0–49.7)
MCH: 29 pg (ref 25.1–34.0)
MCHC: 33 g/dL (ref 31.5–36.0)
MCV: 87.9 fL (ref 79.5–101.0)
MONO#: 2.7 10*3/uL — AB (ref 0.1–0.9)
MONO%: 5.9 % (ref 0.0–14.0)
NEUT%: 84.6 % — ABNORMAL HIGH (ref 38.4–76.8)
NEUTROS ABS: 38.5 10*3/uL — AB (ref 1.5–6.5)
PLATELETS: 285 10*3/uL (ref 145–400)
RBC: 3.38 10*6/uL — AB (ref 3.70–5.45)
RDW: 16.4 % — ABNORMAL HIGH (ref 11.2–14.5)
WBC: 45.4 10*3/uL — AB (ref 3.9–10.3)
lymph#: 1.3 10*3/uL (ref 0.9–3.3)

## 2015-11-30 LAB — MAGNESIUM: Magnesium: 1.3 mg/dl — CL (ref 1.5–2.5)

## 2015-11-30 LAB — TECHNOLOGIST REVIEW

## 2015-11-30 MED ORDER — POTASSIUM CHLORIDE CRYS ER 20 MEQ PO TBCR
20.0000 meq | EXTENDED_RELEASE_TABLET | Freq: Two times a day (BID) | ORAL | Status: DC
  Administered 2015-11-30: 20 meq via ORAL
  Filled 2015-11-30: qty 1

## 2015-11-30 MED ORDER — SODIUM CHLORIDE 0.9 % IJ SOLN
10.0000 mL | INTRAMUSCULAR | Status: DC | PRN
  Administered 2015-11-30: 10 mL via INTRAVENOUS
  Filled 2015-11-30: qty 10

## 2015-11-30 MED ORDER — SODIUM CHLORIDE 0.9% FLUSH
10.0000 mL | INTRAVENOUS | Status: DC | PRN
  Administered 2015-11-30: 10 mL via INTRAVENOUS
  Filled 2015-11-30: qty 10

## 2015-11-30 MED ORDER — HEPARIN SOD (PORK) LOCK FLUSH 100 UNIT/ML IV SOLN
500.0000 [IU] | Freq: Once | INTRAVENOUS | Status: AC
  Administered 2015-11-30: 500 [IU] via INTRAVENOUS
  Filled 2015-11-30: qty 5

## 2015-11-30 MED ORDER — SODIUM CHLORIDE 0.9 % IV SOLN
1.0000 g | Freq: Once | INTRAVENOUS | Status: DC
  Administered 2015-11-30: 1 g via INTRAVENOUS
  Filled 2015-11-30: qty 2

## 2015-11-30 NOTE — Patient Instructions (Signed)
Hypokalemia °Hypokalemia means that the amount of potassium in the blood is lower than normal. Potassium is a chemical, called an electrolyte, that helps regulate the amount of fluid in the body. It also stimulates muscle contraction and helps nerves function properly. Most of the body's potassium is inside of cells, and only a very small amount is in the blood. Because the amount in the blood is so small, minor changes can be life-threatening. °CAUSES °· Antibiotics. °· Diarrhea or vomiting. °· Using laxatives too much, which can cause diarrhea. °· Chronic kidney disease. °· Water pills (diuretics). °· Eating disorders (bulimia). °· Low magnesium level. °· Sweating a lot. °SIGNS AND SYMPTOMS °· Weakness. °· Constipation. °· Fatigue. °· Muscle cramps. °· Mental confusion. °· Skipped heartbeats or irregular heartbeat (palpitations). °· Tingling or numbness. °DIAGNOSIS  °Your health care provider can diagnose hypokalemia with blood tests. In addition to checking your potassium level, your health care provider may also check other lab tests. °TREATMENT °Hypokalemia can be treated with potassium supplements taken by mouth or adjustments in your current medicines. If your potassium level is very low, you may need to get potassium through a vein (IV) and be monitored in the hospital. A diet high in potassium is also helpful. Foods high in potassium are: °· Nuts, such as peanuts and pistachios. °· Seeds, such as sunflower seeds and pumpkin seeds. °· Peas, lentils, and lima beans. °· Whole grain and bran cereals and breads. °· Fresh fruit and vegetables, such as apricots, avocado, bananas, cantaloupe, kiwi, oranges, tomatoes, asparagus, and potatoes. °· Orange and tomato juices. °· Red meats. °· Fruit yogurt. °HOME CARE INSTRUCTIONS °· Take all medicines as prescribed by your health care provider. °· Maintain a healthy diet by including nutritious food, such as fruits, vegetables, nuts, whole grains, and lean meats. °· If  you are taking a laxative, be sure to follow the directions on the label. °SEEK MEDICAL CARE IF: °· Your weakness gets worse. °· You feel your heart pounding or racing. °· You are vomiting or having diarrhea. °· You are diabetic and having trouble keeping your blood glucose in the normal range. °SEEK IMMEDIATE MEDICAL CARE IF: °· You have chest pain, shortness of breath, or dizziness. °· You are vomiting or having diarrhea for more than 2 days. °· You faint. °MAKE SURE YOU:  °· Understand these instructions. °· Will watch your condition. °· Will get help right away if you are not doing well or get worse. °  °This information is not intended to replace advice given to you by your health care provider. Make sure you discuss any questions you have with your health care provider. °  °Document Released: 05/23/2005 Document Revised: 06/13/2014 Document Reviewed: 11/23/2012 °Elsevier Interactive Patient Education ©2016 [employer]. ° °Hypomagnesemia °Hypomagnesemia is a condition in which the level of magnesium in the blood is low. Magnesium is a mineral that is found in many foods. It is used in many different processes in the body. Hypomagnesemia can affect every organ in the body. It can cause life-threatening problems. °CAUSES °Causes of hypomagnesemia include: °· Not getting enough magnesium in your diet. °· Malnutrition. °· Problems with absorbing magnesium from the intestines. °· Dehydration. °· Alcohol abuse. °· Vomiting. °· Severe diarrhea. °· Some medicines, including medicines that make you urinate more. °· Certain diseases, such as kidney disease, diabetes, and overactive thyroid. °SIGNS AND SYMPTOMS °· Involuntary shaking or trembling of a body part (tremor). °· Confusion. °· Muscle weakness. °· Sensitivity to light, sound, and touch. °·   Psychiatric issues, such as depression, irritability, or psychosis. °· Sudden tightening of muscles (muscle spasms). °· Tingling in the arms and legs. °· A feeling of  fluttering of the heart. °These symptoms are more severe if magnesium levels drop suddenly. °DIAGNOSIS °To make a diagnosis, your health care provider will do a physical exam and order blood and urine tests. °TREATMENT °Treatment will depend on the cause and the severity of your condition. It may involve: °· A magnesium supplement. This can be taken in pill form. It can also be given through an IV tube. This is usually done if the condition is severe. °· Changes to your diet. You may be directed to eat foods that have a lot of magnesium, such as green leafy vegetables, peas, beans, and nuts. °· Eliminating alcohol from your diet. °HOME CARE INSTRUCTIONS °· Include foods with magnesium in your diet. Foods that are rich in magnesium include green vegetables, beans, nuts and seeds, and whole grains. °· Take medicines only as directed by your health care provider. °· Take magnesium supplements if your health care provider instructs you to do that. Take them as directed. °· Have your magnesium levels monitored as directed by your health care provider. °· When you are active, drink fluids that contain electrolytes. °· Keep all follow-up visits as directed by your health care provider. This is important. °SEEK MEDICAL CARE IF: °· You get worse instead of better. °· Your symptoms return. °SEEK IMMEDIATE MEDICAL CARE IF: °· Your symptoms are severe. °  °This information is not intended to replace advice given to you by your health care provider. Make sure you discuss any questions you have with your health care provider. °  °Document Released: 02/16/2005 Document Revised: 06/13/2014 Document Reviewed: 01/06/2014 °Elsevier Interactive Patient Education ©2016 Elsevier Inc. ° °

## 2015-11-30 NOTE — Progress Notes (Signed)
Medical Oncology  K 2.7 on 6-26 when here for doxil. Magnesium added and pending. Given 20 mEq po x 2 here and told to increase to 20 mEq tid For recheck BMET on 6-29 or 6-30 Spoke with infusion RN Message to collaborative RN  L.Marko Plume, MD

## 2015-11-30 NOTE — Progress Notes (Signed)
Nutrition follow-up completed with patient in infusion is being treated for endometrial cancer. Weight has decreased was documented as 185 pounds on June 15, down from 211.9 pounds April 27. Patient requests referral to see a GI physician/Specialist because she cannot eat and she is still bleeding. 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 States she is afraid to eat. She discontinued boost breeze and Gatorade because it was too high in sugar.  However, now that she is dehydrated, she has added these back into her diet.  Nutrition diagnosis: Inadequate oral intake continues.  Intervention: Patient educated to follow a low fiber diet consuming small amounts of food throughout the day and keeping a food journal on what is tolerated and was not tolerated. Encouraged patient to try low fiber diet for increased energy and healing. Encouraged her to continue liquids including boost breeze for additional calories and protein. Provided a copy of low fiber diet.  Questions were answered.  Teach back method was used.  Monitoring, evaluation, goals: Patient will tolerate increased calories and protein to minimize further weight loss and improve quality-of-life.  Next visit: Monday, July 24, during infusion.  **Disclaimer: This note was dictated with voice recognition software. Similar sounding words can inadvertently be transcribed and this note may contain transcription errors which may not have been corrected upon publication of note.**

## 2015-11-30 NOTE — Progress Notes (Signed)
Pt vss upon discharge. UA sent and results were reviewed by Dr. Marko Plume, Pt okay to leave. No further treatment at this time. Instructed pt to increase fluid hydration, and to check her temperature 2x a day and to call if it is over 100.5 and symptomatic. Pt voiced understanding and will call for any concerns. Dr. Marko Plume will reschedule pt doxil treatment later this week. Pt will get confirmation of appt time and schedule.

## 2015-11-30 NOTE — Patient Instructions (Signed)

## 2015-11-30 NOTE — Telephone Encounter (Signed)
Pt will get updated sched in tx room °

## 2015-11-30 NOTE — Progress Notes (Signed)
NO chemo today with lab results and low grade fever per Dr. Marko Plume. Pt aware MD ordered a urine for C and S and was provided collection materials and she reported she cannot go right now.

## 2015-12-01 LAB — URINE CULTURE: ORGANISM ID, BACTERIA: NO GROWTH

## 2015-12-02 ENCOUNTER — Telehealth: Payer: Self-pay

## 2015-12-02 NOTE — Telephone Encounter (Signed)
lvm for pt to call if any UTI symptoms remaining or any fever or other problems. Doxil scheduled for tomorrow.

## 2015-12-02 NOTE — Telephone Encounter (Signed)
-----   Message from Gordy Levan, MD sent at 12/02/2015  7:48 AM EDT ----- Labs seen and need follow up: please let her know no UTI. Ask if any fever now, as doxil rescheduled to 6-29

## 2015-12-03 ENCOUNTER — Ambulatory Visit (HOSPITAL_BASED_OUTPATIENT_CLINIC_OR_DEPARTMENT_OTHER): Payer: Federal, State, Local not specified - PPO

## 2015-12-03 ENCOUNTER — Ambulatory Visit: Payer: Federal, State, Local not specified - PPO

## 2015-12-03 ENCOUNTER — Other Ambulatory Visit: Payer: Self-pay

## 2015-12-03 ENCOUNTER — Other Ambulatory Visit: Payer: Self-pay | Admitting: Oncology

## 2015-12-03 VITALS — BP 125/60 | HR 93 | Temp 99.0°F | Resp 18

## 2015-12-03 DIAGNOSIS — C772 Secondary and unspecified malignant neoplasm of intra-abdominal lymph nodes: Secondary | ICD-10-CM

## 2015-12-03 DIAGNOSIS — E876 Hypokalemia: Secondary | ICD-10-CM

## 2015-12-03 DIAGNOSIS — Z95828 Presence of other vascular implants and grafts: Secondary | ICD-10-CM

## 2015-12-03 DIAGNOSIS — Z452 Encounter for adjustment and management of vascular access device: Secondary | ICD-10-CM

## 2015-12-03 DIAGNOSIS — C541 Malignant neoplasm of endometrium: Secondary | ICD-10-CM

## 2015-12-03 LAB — COMPREHENSIVE METABOLIC PANEL
ALK PHOS: 116 U/L (ref 40–150)
Albumin: 2.5 g/dL — ABNORMAL LOW (ref 3.5–5.0)
Anion Gap: 11 mEq/L (ref 3–11)
BUN: 8.5 mg/dL (ref 7.0–26.0)
CALCIUM: 8.5 mg/dL (ref 8.4–10.4)
CO2: 21 mEq/L — ABNORMAL LOW (ref 22–29)
CREATININE: 1.1 mg/dL (ref 0.6–1.1)
Chloride: 102 mEq/L (ref 98–109)
EGFR: 52 mL/min/{1.73_m2} — ABNORMAL LOW (ref >90)
GLUCOSE: 107 mg/dL (ref 70–140)
Potassium: 2.3 mEq/L — CL (ref 3.5–5.1)
Sodium: 134 mEq/L — ABNORMAL LOW (ref 136–145)
Total Bilirubin: 1.03 mg/dL (ref 0.20–1.20)
Total Protein: 5.8 g/dL — ABNORMAL LOW (ref 6.4–8.3)

## 2015-12-03 LAB — CBC WITH DIFFERENTIAL/PLATELET
BASO%: 0.8 % (ref 0.0–2.0)
Basophils Absolute: 0.3 10*3/uL — ABNORMAL HIGH (ref 0.0–0.1)
EOS%: 8.7 % — AB (ref 0.0–7.0)
Eosinophils Absolute: 3.4 10*3/uL — ABNORMAL HIGH (ref 0.0–0.5)
HEMATOCRIT: 27.9 % — AB (ref 34.8–46.6)
HGB: 9 g/dL — ABNORMAL LOW (ref 11.6–15.9)
LYMPH#: 0.8 10*3/uL — AB (ref 0.9–3.3)
LYMPH%: 2.1 % — ABNORMAL LOW (ref 14.0–49.7)
MCH: 28.2 pg (ref 25.1–34.0)
MCHC: 32.5 g/dL (ref 31.5–36.0)
MCV: 86.8 fL (ref 79.5–101.0)
MONO#: 2.9 10*3/uL — ABNORMAL HIGH (ref 0.1–0.9)
MONO%: 7.5 % (ref 0.0–14.0)
NEUT%: 80.9 % — ABNORMAL HIGH (ref 38.4–76.8)
NEUTROS ABS: 31.1 10*3/uL — AB (ref 1.5–6.5)
Platelets: 348 10*3/uL (ref 145–400)
RBC: 3.21 10*6/uL — ABNORMAL LOW (ref 3.70–5.45)
RDW: 16.2 % — AB (ref 11.2–14.5)
WBC: 38.4 10*3/uL — AB (ref 3.9–10.3)

## 2015-12-03 LAB — TECHNOLOGIST REVIEW

## 2015-12-03 MED ORDER — SODIUM CHLORIDE 0.9 % IV SOLN
Freq: Once | INTRAVENOUS | Status: AC
  Administered 2015-12-03: 16:00:00 via INTRAVENOUS
  Filled 2015-12-03: qty 250

## 2015-12-03 MED ORDER — POTASSIUM CHLORIDE ER 10 MEQ PO TBCR: 20.0000 meq | EXTENDED_RELEASE_TABLET | Freq: Two times a day (BID) | ORAL | Status: DC

## 2015-12-03 MED ORDER — HEPARIN SOD (PORK) LOCK FLUSH 100 UNIT/ML IV SOLN
500.0000 [IU] | Freq: Once | INTRAVENOUS | Status: AC | PRN
  Administered 2015-12-03: 500 [IU] via INTRAVENOUS
  Filled 2015-12-03: qty 5

## 2015-12-03 MED ORDER — SODIUM CHLORIDE 0.9 % IJ SOLN
10.0000 mL | INTRAMUSCULAR | Status: DC | PRN
  Administered 2015-12-03: 10 mL via INTRAVENOUS
  Filled 2015-12-03: qty 10

## 2015-12-03 MED ORDER — ALTEPLASE 2 MG IJ SOLR
2.0000 mg | Freq: Once | INTRAMUSCULAR | Status: AC | PRN
  Administered 2015-12-03: 2 mg
  Filled 2015-12-03: qty 2

## 2015-12-03 MED ORDER — POTASSIUM CHLORIDE CRYS ER 20 MEQ PO TBCR
40.0000 meq | EXTENDED_RELEASE_TABLET | ORAL | Status: AC
  Administered 2015-12-03: 40 meq via ORAL
  Filled 2015-12-03: qty 2

## 2015-12-03 NOTE — Progress Notes (Signed)
Pt refusing to take additional 40 meq potassium PO.  Dr. Marko Plume notified of pt's refusal.  Patient states she has coated 10 meq tablets at home and would prefer to wait and take those.  Per Dr. Marko Plume patient should take 40 meq of potassium when she gets home.  Educated patient on importance of potassium and need to be compliant with instructions.  Patient and family member voiced understanding.

## 2015-12-03 NOTE — Patient Instructions (Signed)

## 2015-12-03 NOTE — Patient Instructions (Addendum)
**Take 18mEq of potassium chloride three times per day.**  Hypomagnesemia Hypomagnesemia is a condition in which the level of magnesium in the blood is low. Magnesium is a mineral that is found in many foods. It is used in many different processes in the body. Hypomagnesemia can affect every organ in the body. It can cause life-threatening problems. CAUSES Causes of hypomagnesemia include:  Not getting enough magnesium in your diet.  Malnutrition.  Problems with absorbing magnesium from the intestines.  Dehydration.  Alcohol abuse.  Vomiting.  Severe diarrhea.  Some medicines, including medicines that make you urinate more.  Certain diseases, such as kidney disease, diabetes, and overactive thyroid. SIGNS AND SYMPTOMS  Involuntary shaking or trembling of a body part (tremor).  Confusion.  Muscle weakness.  Sensitivity to light, sound, and touch.  Psychiatric issues, such as depression, irritability, or psychosis.  Sudden tightening of muscles (muscle spasms).  Tingling in the arms and legs.  A feeling of fluttering of the heart. These symptoms are more severe if magnesium levels drop suddenly. DIAGNOSIS To make a diagnosis, your health care provider will do a physical exam and order blood and urine tests. TREATMENT Treatment will depend on the cause and the severity of your condition. It may involve:  A magnesium supplement. This can be taken in pill form. It can also be given through an IV tube. This is usually done if the condition is severe.  Changes to your diet. You may be directed to eat foods that have a lot of magnesium, such as green leafy vegetables, peas, beans, and nuts.  Eliminating alcohol from your diet. HOME CARE INSTRUCTIONS  Include foods with magnesium in your diet. Foods that are rich in magnesium include green vegetables, beans, nuts and seeds, and whole grains.  Take medicines only as directed by your health care provider.  Take  magnesium supplements if your health care provider instructs you to do that. Take them as directed.  Have your magnesium levels monitored as directed by your health care provider.  When you are active, drink fluids that contain electrolytes.  Keep all follow-up visits as directed by your health care provider. This is important. SEEK MEDICAL CARE IF:  You get worse instead of better.  Your symptoms return. SEEK IMMEDIATE MEDICAL CARE IF:  Your symptoms are severe.   This information is not intended to replace advice given to you by your health care provider. Make sure you discuss any questions you have with your health care provider.   Document Released: 02/16/2005 Document Revised: 06/13/2014 Document Reviewed: 01/06/2014 Elsevier Interactive Patient Education 2016 [employer].  Hypokalemia Hypokalemia means that the amount of potassium in the blood is lower than normal.Potassium is a chemical, called an electrolyte, that helps regulate the amount of fluid in the body. It also stimulates muscle contraction and helps nerves function properly.Most of the body's potassium is inside of cells, and only a very small amount is in the blood. Because the amount in the blood is so small, minor changes can be life-threatening. CAUSES  Antibiotics.  Diarrhea or vomiting.  Using laxatives too much, which can cause diarrhea.  Chronic kidney disease.  Water pills (diuretics).  Eating disorders (bulimia).  Low magnesium level.  Sweating a lot. SIGNS AND SYMPTOMS  Weakness.  Constipation.  Fatigue.  Muscle cramps.  Mental confusion.  Skipped heartbeats or irregular heartbeat (palpitations).  Tingling or numbness. DIAGNOSIS  Your health care provider can diagnose hypokalemia with blood tests. In addition to checking your potassium  level, your health care provider may also check other lab tests. TREATMENT Hypokalemia can be treated with potassium supplements taken by mouth  or adjustments in your current medicines. If your potassium level is very low, you may need to get potassium through a vein (IV) and be monitored in the hospital. A diet high in potassium is also helpful. Foods high in potassium are:  Nuts, such as peanuts and pistachios.  Seeds, such as sunflower seeds and pumpkin seeds.  Peas, lentils, and lima beans.  Whole grain and bran cereals and breads.  Fresh fruit and vegetables, such as apricots, avocado, bananas, cantaloupe, kiwi, oranges, tomatoes, asparagus, and potatoes.  Orange and tomato juices.  Red meats.  Fruit yogurt. HOME CARE INSTRUCTIONS  Take all medicines as prescribed by your health care provider.  Maintain a healthy diet by including nutritious food, such as fruits, vegetables, nuts, whole grains, and lean meats.  If you are taking a laxative, be sure to follow the directions on the label. SEEK MEDICAL CARE IF:  Your weakness gets worse.  You feel your heart pounding or racing.  You are vomiting or having diarrhea.  You are diabetic and having trouble keeping your blood glucose in the normal range. SEEK IMMEDIATE MEDICAL CARE IF:  You have chest pain, shortness of breath, or dizziness.  You are vomiting or having diarrhea for more than 2 days.  You faint. MAKE SURE YOU:   Understand these instructions.  Will watch your condition.  Will get help right away if you are not doing well or get worse.   This information is not intended to replace advice given to you by your health care provider. Make sure you discuss any questions you have with your health care provider.   Document Released: 05/23/2005 Document Revised: 06/13/2014 Document Reviewed: 11/23/2012 Elsevier Interactive Patient Education 2016 [employer]vier Inc.  **Take 24mEq of potassium chloride three times per day.**

## 2015-12-03 NOTE — Progress Notes (Signed)
1344: Pt arrived to infusion room from flush.  Unable to obtain adequate blood for labs d/t sluggish blood return.  Cathflo instilled per policy.  Pt offered peripheral stick for labs/infusion while cathflo instilled; however, she declined and stated she would rather do labs/treatment through [facility].   1415: 30 minute cathflo check; blood remains sluggish.  Will recheck after additional 30 minutes.   1445: Blood return noted at next cathflo check.  Cathflo aspirated.  Labs drawn and sent to lab.   1545: Labs resulted and reviewed.  K+

## 2015-12-04 ENCOUNTER — Telehealth: Payer: Self-pay | Admitting: Oncology

## 2015-12-04 ENCOUNTER — Telehealth: Payer: Self-pay | Admitting: *Deleted

## 2015-12-04 NOTE — Telephone Encounter (Signed)
lvm for pt regarding to July appts

## 2015-12-04 NOTE — Telephone Encounter (Signed)
Per staff message and POF I have scheduled appts. Advised scheduler of appts and first available given. JMW  

## 2015-12-07 ENCOUNTER — Telehealth: Payer: Self-pay

## 2015-12-07 NOTE — Telephone Encounter (Signed)
Fine to get UA C&S, CBC and PT INR if she wants to come today

## 2015-12-07 NOTE — Telephone Encounter (Signed)
Called pt with Dr Camila Li response. Pt stated she would wait until her appt on Thursday.

## 2015-12-07 NOTE — Telephone Encounter (Signed)
Pt called c/o blood in urine. No pain. Started last night.  No urgency, no increased odor. She wears adult diapers and it comes out when she is walking around, not only when she urinates. Not as heavy as a period. It is far forward on the diaper. 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 She has abdominal pain and gas.  She has diarrea all the time.

## 2015-12-09 ENCOUNTER — Other Ambulatory Visit: Payer: Self-pay | Admitting: Oncology

## 2015-12-09 DIAGNOSIS — R197 Diarrhea, unspecified: Secondary | ICD-10-CM

## 2015-12-09 DIAGNOSIS — D500 Iron deficiency anemia secondary to blood loss (chronic): Secondary | ICD-10-CM

## 2015-12-10 ENCOUNTER — Ambulatory Visit (HOSPITAL_BASED_OUTPATIENT_CLINIC_OR_DEPARTMENT_OTHER): Payer: Federal, State, Local not specified - PPO | Admitting: Oncology

## 2015-12-10 ENCOUNTER — Ambulatory Visit: Payer: Federal, State, Local not specified - PPO

## 2015-12-10 ENCOUNTER — Encounter: Payer: Self-pay | Admitting: Oncology

## 2015-12-10 ENCOUNTER — Ambulatory Visit (HOSPITAL_BASED_OUTPATIENT_CLINIC_OR_DEPARTMENT_OTHER): Payer: Federal, State, Local not specified - PPO

## 2015-12-10 ENCOUNTER — Other Ambulatory Visit (HOSPITAL_BASED_OUTPATIENT_CLINIC_OR_DEPARTMENT_OTHER): Payer: Federal, State, Local not specified - PPO

## 2015-12-10 ENCOUNTER — Ambulatory Visit (HOSPITAL_COMMUNITY)
Admission: RE | Admit: 2015-12-10 | Discharge: 2015-12-10 | Disposition: A | Discharge status: Home / Self Care | Payer: Federal, State, Local not specified - PPO | Source: Ambulatory Visit | Attending: Oncology | Admitting: Oncology

## 2015-12-10 VITALS — BP 87/49 | HR 104 | Temp 98.5°F | Resp 18 | Ht 60.0 in | Wt 181.0 lb

## 2015-12-10 VITALS — BP 96/53 | HR 86 | Temp 98.6°F | Resp 18

## 2015-12-10 DIAGNOSIS — D500 Iron deficiency anemia secondary to blood loss (chronic): Secondary | ICD-10-CM

## 2015-12-10 DIAGNOSIS — C541 Malignant neoplasm of endometrium: Secondary | ICD-10-CM

## 2015-12-10 DIAGNOSIS — N939 Abnormal uterine and vaginal bleeding, unspecified: Secondary | ICD-10-CM

## 2015-12-10 DIAGNOSIS — C772 Secondary and unspecified malignant neoplasm of intra-abdominal lymph nodes: Secondary | ICD-10-CM

## 2015-12-10 DIAGNOSIS — M7989 Other specified soft tissue disorders: Secondary | ICD-10-CM | POA: Diagnosis not present

## 2015-12-10 DIAGNOSIS — E876 Hypokalemia: Secondary | ICD-10-CM | POA: Diagnosis not present

## 2015-12-10 DIAGNOSIS — C7989 Secondary malignant neoplasm of other specified sites: Secondary | ICD-10-CM

## 2015-12-10 LAB — URINALYSIS, MICROSCOPIC - CHCC
Bilirubin (Urine): NEGATIVE
GLUCOSE UR CHCC: NEGATIVE mg/dL
Ketones: NEGATIVE mg/dL
NITRITE: NEGATIVE
PH: 6 (ref 4.6–8.0)
PROTEIN: 30 mg/dL
Specific Gravity, Urine: 1.02 (ref 1.003–1.035)
Urobilinogen, UR: 0.2 mg/dL (ref 0.2–1)

## 2015-12-10 LAB — CBC WITH DIFFERENTIAL/PLATELET
BASO%: 1.2 % (ref 0.0–2.0)
BASOS ABS: 0.7 10*3/uL — AB (ref 0.0–0.1)
EOS%: 9.7 % — AB (ref 0.0–7.0)
Eosinophils Absolute: 5.1 10*3/uL — ABNORMAL HIGH (ref 0.0–0.5)
HCT: 28.7 % — ABNORMAL LOW (ref 34.8–46.6)
HGB: 9.1 g/dL — ABNORMAL LOW (ref 11.6–15.9)
LYMPH%: 2.5 % — AB (ref 14.0–49.7)
MCH: 27.9 pg (ref 25.1–34.0)
MCHC: 31.5 g/dL (ref 31.5–36.0)
MCV: 88.3 fL (ref 79.5–101.0)
MONO#: 2.7 10*3/uL — ABNORMAL HIGH (ref 0.1–0.9)
MONO%: 5.1 % (ref 0.0–14.0)
NEUT#: 42.8 10*3/uL — ABNORMAL HIGH (ref 1.5–6.5)
NEUT%: 81.5 % — ABNORMAL HIGH (ref 38.4–76.8)
Platelets: 331 10*3/uL (ref 145–400)
RBC: 3.25 10*6/uL — ABNORMAL LOW (ref 3.70–5.45)
RDW: 16.1 % — AB (ref 11.2–14.5)
WBC: 52.5 10*3/uL (ref 3.9–10.3)
lymph#: 1.3 10*3/uL (ref 0.9–3.3)
nRBC: 0 % (ref 0–0)

## 2015-12-10 LAB — COMPREHENSIVE METABOLIC PANEL
ALT: <9 U/L (ref 0–55)
AST: <7 U/L (ref 5–34)
Albumin: 2.4 g/dL — ABNORMAL LOW (ref 3.5–5.0)
Alkaline Phosphatase: 113 U/L (ref 40–150)
Anion Gap: 11 mEq/L (ref 3–11)
BUN: 17.6 mg/dL (ref 7.0–26.0)
CO2: 16 meq/L — AB (ref 22–29)
Calcium: 8.4 mg/dL (ref 8.4–10.4)
Chloride: 104 mEq/L (ref 98–109)
Creatinine: 1.2 mg/dL — ABNORMAL HIGH (ref 0.6–1.1)
EGFR: 48 mL/min/{1.73_m2} — AB (ref >90)
GLUCOSE: 89 mg/dL (ref 70–140)
POTASSIUM: 3.6 meq/L (ref 3.5–5.1)
SODIUM: 132 meq/L — AB (ref 136–145)
Total Bilirubin: 1 mg/dL (ref 0.20–1.20)
Total Protein: 6 g/dL — ABNORMAL LOW (ref 6.4–8.3)

## 2015-12-10 LAB — PROTIME-INR
INR: 1.2 — ABNORMAL LOW (ref 2.00–3.50)
PROTIME: 14.4 s — AB (ref 10.6–13.4)

## 2015-12-10 LAB — MAGNESIUM: Magnesium: 1.5 mg/dl (ref 1.5–2.5)

## 2015-12-10 LAB — PREPARE RBC (CROSSMATCH)

## 2015-12-10 MED ORDER — ACETAMINOPHEN 325 MG PO TABS
ORAL_TABLET | ORAL | Status: AC
  Filled 2015-12-10: qty 2

## 2015-12-10 MED ORDER — HEPARIN SOD (PORK) LOCK FLUSH 100 UNIT/ML IV SOLN
500.0000 [IU] | Freq: Every day | INTRAVENOUS | Status: AC | PRN
  Administered 2015-12-10: 500 [IU]
  Filled 2015-12-10: qty 5

## 2015-12-10 MED ORDER — VITAMIN K (PHYTONADIONE) 100 MCG PO TABS: 100.0000 ug | ORAL_TABLET | Freq: Once | ORAL | Status: AC

## 2015-12-10 MED ORDER — HEPARIN SOD (PORK) LOCK FLUSH 100 UNIT/ML IV SOLN
250.0000 [IU] | INTRAVENOUS | Status: DC | PRN
  Filled 2015-12-10: qty 5

## 2015-12-10 MED ORDER — POTASSIUM CHLORIDE 20 MEQ/15ML (10%) PO SOLN: 20.0000 meq | Freq: Two times a day (BID) | ORAL | Status: AC

## 2015-12-10 MED ORDER — SODIUM CHLORIDE 0.9% FLUSH
10.0000 mL | INTRAVENOUS | Status: AC | PRN
  Administered 2015-12-10: 10 mL
  Filled 2015-12-10: qty 10

## 2015-12-10 MED ORDER — ACETAMINOPHEN 325 MG PO TABS
325.0000 mg | ORAL_TABLET | Freq: Once | ORAL | Status: AC
  Administered 2015-12-10: 325 mg via ORAL

## 2015-12-10 MED ORDER — SODIUM CHLORIDE 0.9% FLUSH
3.0000 mL | INTRAVENOUS | Status: DC | PRN
  Filled 2015-12-10: qty 10

## 2015-12-10 NOTE — Progress Notes (Signed)
Per Dr Marko Plume, pt not to have chemotherapy today r/t abn lab values. Pt is to receive 1 unit of PRBC.

## 2015-12-10 NOTE — Progress Notes (Signed)
OFFICE PROGRESS NOTE   December 10, 2015   Physicians: [name]/ [name]/ [name] [facility]), [name], [name] [facility] sarcoma clinic),[name] (PCP [facility]), [name]; [name], [name] 11-09-15 Dr Anson Fret Dr Acey Lav [facility] sarcoma clinic, consultation 12-16-15.  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 PS improved enough to begin treatment with doxil in palliative attempt on 10-30-15, as patient strongly desired treatment. Second doxil planned 12-03-15 was held for potassium 2.3 that day; plan was for the second cycle doxil to be given today, however patient tells me that she has consultation with Dr Acey Lav [facility] sarcoma clinic on 12-16-15. In order not to interfere with other possible recommendations with that consultation, we have decided to hold doxil again today.  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 With progressive decline in hemoglobin and ongoing bleeding, she will be transfused 1 unit PRBCs today.  Patient has had this vaginal bleeding since 12-06-15, soaking adult diapers with blood about every 6 hrs. This is not painful and not hematuria. She has no fecal incontinence, no marked diarrhea now. The mucous fistula, which has had almost continuous bleeding x weeks, in past 24 hrs has had only a large amount of clear liquid (bag filled this AM and half filled by noon apt now. She has no new or different pain in abdomen or at mucous fistula now. She notices increased swelling in LE in past week, not painful. She denies increased SOB or chest pain. No problems with PAC. No fever or clear symptoms of infection. Appetite better, ate large amount of barbeque on 12-09-15,  but has not been hungry today.   Remainder of 10 point Review of Systems negative / unchanged.    PAC in feraheme 11-08-15 and 11-19-15 Bilateral LE venous dopplers done after MD visit 12-10-15 negative for DVT   ONCOLOGIC HISTORY Patient had heavy postmenopausal bleeding Sept 2015, then lesser bleeding over next several months until she was seen in 06-2014 by [name]. Hemoglobin was 12.7 on 07-04-14. CT CAP 06-25-14 had uterus 16.4 x 13.5 x 14.4 cm with marked expansion of endometrial canal by heterogeneous mass, bilateral pelvic sidewall adenopathy, iliac adenopathy with no definite retroperitoneal or mesenteric adenopathy, no ascites, no liver mets. Attempted endometrial sampling 06-26-14 and 07-03-14 was nondiagnostic; around that time the patient was passing clots and using one large maxipad hourly. She was seen by [name] 07-05-14, with uterine fundus palpable above umbilicus. She had surgery by [name] at [facility] on 07-14-14, which was exploratory laparotomy with TAH BSO, bilateral total pelvic lymphadenectomy and sampling of aortic and renal nodes. Pathology [facility] [phone]) found high grade endometrial stromal sarcoma with primary 18 cm, 8/45 nodes involved including 2 right pelvic and 6 left pelvic nodes, ER PR negative. Case was presented at [facility] multidisciplinary conference 07-23-14, with recommendation for PET CT to evaluate inguinal and portahepatis nodes, and to consider adjuvant gemzar taxotere and possibly follow with 4 cycles of adriamycin, then to consider whole pelvic RT. She saw [name] for post op follow up on 07-28-14, with recommendation for gemzar taxotere and adriamycin, whole pelvic RT after chemo and consideration of Megace maintenance after chemo (possibly prior to negative ER PR information). PET 08-01-14 had some uptake in aortocaval and left  paraaortic regions. She had day 1 cycle 1 gemzar taxotere on 08-28-14, day 8 cycle 1 on 09-04-14 and neulasta on 09-06-14. Admitted with  neutropenic fever on day 14 cycle 1, with oral mucositis and some diarrhea. Counts maintained cycle 2 using OnPro neulasta day 9. She had progressive LE swelling after cycle 3, with venous dopplers negative 10-23-14, then LE swelling and SOB so marked after day 1 cycle 4 10-30-14 that chemo was held. Restaging CT AP + CXR 11-10-14 had stable tiny left pulmonary nodule/ no pleural effusion and normal heart size; CT had necrotic aortocaval adenopathy, iliac adenopathy L>R and 7x10 cm fluid collection in pelvis. She received IMRT 45 gray in 25 fractions to pelvis from 6-27 thru 01-05-15, with resolution of LE swelling by completion of course. CT CAP 02-03-15 showed improvement in retroperitoneal and pelvic involvement. She went onto observation after RT, plan to wait until clear progressive disease before resuming treatment, possibly adriamycin vs on study. CT CAP 05-07-15 showed new lytic lesion at T4, stable 2-3 mm pulmonary nodules, increased left pelvic and retroperitoneal nodes and RLQ peritoneal nodule. Radiation therapy summary as follows: Indication for treatment: A new bone lesion in the right side of the T4 vertebral body, with soft tissue extension slightly impinging upon the central spinal canal, some pain from this lesion Radiation treatment dates: 05/18/2015 through 06/05/2015 Site/dose: T4 thoracic spine area, 35 gray in 14 fractions She was not eligible for any of the arms of MATCH trial by evaluation in Jan 2017. Plan was to begin treatment with adriamycin + olaratumab, however this not begun prior to hospitalization 07-30-15 with abdominal/ pelvic abscess. She was hospitalized at [facility] from 2-23 thru 08-27-15 with bowel fistulas and abdominopelvic abscesses. She was transferred to [facility] on 08-27-15 and remained hospitalized there thru ~ 09-28-15, with 2 surgeries for bowel involvement with malignancy. IR drains fell out in 10-2015, with CT AP [facility] 10-22-15 showing progression of extensive  intraabdominal disease. 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 Patient requested trying further chemo, first doxil given 10-30-15. Hospitalized for E coli UTI and bleeding mucous fistula 6-2 thru 11-11-15, GI consult in hospital did not recommend endoscopic evaluation of mucous fistula due to risk of perforation.      Objective:  Vital signs in last 24 hours:  BP 87/49 mmHg  Pulse 104  Temp(Src) 98.5 F (36.9 C) (Oral)  Resp 18  Ht 5' (1.524 m)  Wt 181 lb (82.101 kg)  BMI 35.35 kg/m2  SpO2 100%  LMP 02/04/2014 Weight down 4 lbs from 11-19-15 Alert, oriented and appropriate, looks tired and somewhat pale but otherwise no acute discomfort in WC on RA.   HEENT:PERRL, sclerae not icteric. Oral mucosa moist without lesions, posterior pharynx clear.  Neck supple. No JVD.  Lymphatics:no cervical,supraclavicular adenopathy Resp: clear to auscultation bilaterally  Cardio: regular rate and rhythm. No gallop. GI: abdomen soft, nontender, not more stended.  A few bowel sounds. Surgical incision closed. Mucous fistula bag half filled with watery liquid, no bleeding now from stoma.  Musculoskeletal/ Extremities: 2+ pedal edema bilaterally without  cords, tenderness Neuro: speech fluent, moves all extremities, no focal deficits on exam in wheelchair. PSYCH appropriate mood and affect Skin without rash, ecchymosis, petechiae Portacath-without erythema or tenderness  Lab Results:  Results for orders placed or performed in visit on 12/10/15  Protime-INR  Result Value Ref Range   Protime 14.4 (H) 10.6 - 13.4 Seconds   INR 1.20 (L) 2.00 - 3.50   Lovenox No   CBC with Differential  Result Value Ref Range   WBC 52.5 (HH) 3.9 - 10.3 10e3/uL   NEUT# 42.8 (H) 1.5 - 6.5 10e3/uL   HGB 9.1 (L) 11.6 - 15.9 g/dL   HCT 28.7 (L) 34.8 - 46.6 %    Platelets 331 145 - 400 10e3/uL   MCV 88.3 79.5 - 101.0 fL   MCH 27.9 25.1 - 34.0 pg   MCHC 31.5 31.5 - 36.0 g/dL   RBC 3.25 (L) 3.70 - 5.45 10e6/uL   RDW 16.1 (H) 11.2 - 14.5 %   lymph# 1.3 0.9 - 3.3 10e3/uL   MONO# 2.7 (H) 0.1 - 0.9 10e3/uL   Eosinophils Absolute 5.1 (H) 0.0 - 0.5 10e3/uL   Basophils Absolute 0.7 (H) 0.0 - 0.1 10e3/uL   NEUT% 81.5 (H) 38.4 - 76.8 %   LYMPH% 2.5 (L) 14.0 - 49.7 %   MONO% 5.1 0.0 - 14.0 %   EOS% 9.7 (H) 0.0 - 7.0 %   BASO% 1.2 0.0 - 2.0 %   nRBC 0 0 - 0 %   labs reviewed with patient at time of visit Hgb down from 9.8 on 6-26 Urine culture sent and pending today  Studies/Results:  Bilateral LE venous dopplers negative for DVT after MD visit today  Medications: I have reviewed the patient's current medications. She has not taken supplemental K as prescribed; not on oral magnesium due to diarrhea, did have IV magnesium 2 gm on 12-03-15 She prefers liquid potassium as she saw 2 tablets pass in stool intact. Will continue 20 mEq bid.   DISCUSSION I have explained to patient that treating her today could potentially limit or delay other treatment from [facility] sarcoma clinic, and she much prefers to hold #2 doxil now. She agrees with PRBC transfusion, this given 1 unit after visit today.  I have told her that Dr Angelina Ok may want to repeat scans at [facility]. We can certainly get gyn oncology to do pelvic exam here or at [facility] if needed.   I have spoken directly with [place] clinic new patient scheduler Vivi Martens, confirmed that they have access to [facility] records thru [facility]. Radiology discs were received at [facility] today.   I will wait to hear back from patient or other physicians before any additional scheduling at [facility].  Assessment/Plan:  1. 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 No treatment available on MATCH trial  thru [facility]. Plan had been for adriamycin + olaratumab in Feb, not begun due to interval bowel perforation(s) with abdominal and pelvic abscesses. Now post surgeries x 2 with entero- enterostomy and mucous fistula. She initially improved from very complicated 2 month hospitalization,and at her request was given doxil (dose reduced) on 10-30-15 in palliative attempt. IR drains at [facility] were not successful. Overall prognosis obviously very poor, doxil in palliative attempt and tolerated cycle 1 well. Cycle 2 doxil held 6-29 with hypokalemia and hypomag, and held today due to consultation at [place] clinic planned 12-16-15. I will see her back if I can be of help after that consultation. She has not wanted to stop all treatment yet, tho Hospice has been discussed. HH in place for now.  2.new bilateral LE swelling: venous dopplers today negative for DVT.  Elevate. Increase protein in diet.  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  [name] feels this is tumor involvement.  GI consult by [name] 11-09-15, patient reluctant to proceed with endoscopy of mucous fistula given risks. Recent feraheme, 1 unit PRBCs today and will transfuse outpatient as needed.  4.New vaginal bleeding x 5 days, likely also tumor related. PRBCs now and Vit K po as INR slightly elevated.  May need CT, may need gyn onc exam. I would not be surprised if [facility] wants repeat scan at that facility.  5. protein calorie malnutrition: likes Boost Breeze, good nutrition emphasized again 6.degenerative arthritis knees very symptomatic prior to all of recent complications 7. DNR discussed and confirmed, advance directives in place 8.social situation: lives alone, equidistant [place] and [place]. Caregiver arranged for several hours daily, HH nursing and PT involved. Still very deconditioned. 9.Renal US 6-6 showed some left hydronephrosis only, bladder ok, this may be better than the mild-moderate bilateral hydronephrosis by [facility]  CT5-18-17, creatinine stable today.  10.hx psoriasis and nonmelanoma skin ca. Hx morbid obesity and previous gastric banding 11. PAC in 12. Hypokalemia supplementing, potassium and magnesium better today 13. E coli UTI at recent hospital admission: clinically resolved, repeat urine culture pending today. Recent information: completed 14 day course of levaquin and flagyl prior to 10-30-15 Had E coli in pelvic abscesses in 07-2015, GPC in 1/2 blood cultures 08-24-15, MSSA in blood cx at [facility].   All questions answered and she knows to call if concerns over next few days, will let us know plan after she meets with Dr Angelina Ok. Chemo orders held, transfusion orders placed. Time spent 35 min including >50% counseling and coordination of care.    Zyrah Wiswell P, MD   12/10/2015, 12:27 PM

## 2015-12-10 NOTE — Progress Notes (Addendum)
*  Preliminary Results* Bilateral lower extremity venous duplex completed. Study was technically difficult due to edema and patient body habitus. Visualized veins of bilateral lower extremities are negative for deep vein thrombosis. There is no evidence of Baker's cyst bilaterally.  Preliminary results discussed with Dr. Marko Plume.  12/10/2015 2:52 PM  Maudry Mayhew, RVT, RDCS, RDMS

## 2015-12-10 NOTE — Patient Instructions (Signed)

## 2015-12-11 LAB — TYPE AND SCREEN
ABO/RH(D): O POS
Antibody Screen: NEGATIVE
Unit division: 0

## 2015-12-12 LAB — URINE CULTURE

## 2015-12-13 ENCOUNTER — Telehealth: Payer: Self-pay

## 2015-12-13 DIAGNOSIS — N390 Urinary tract infection, site not specified: Secondary | ICD-10-CM

## 2015-12-13 DIAGNOSIS — R319 Hematuria, unspecified: Principal | ICD-10-CM

## 2015-12-13 MED ORDER — NITROFURANTOIN MONOHYD MACRO 100 MG PO CAPS: 100.0000 mg | ORAL_CAPSULE | Freq: Two times a day (BID) | ORAL | Status: AC

## 2015-12-13 NOTE — Telephone Encounter (Signed)
-----   Message from Gordy Levan, MD sent at 12/12/2015  4:51 PM EDT ----- Labs seen and need follow up: macrobid 100 mg bid x 5 days for E coli UTI

## 2015-12-14 NOTE — Telephone Encounter (Signed)
Called pt to inform her that she has a UTI and Rx of Macrobid has been sent to her pharmacy to p/u. Pt was informed to take antibiotic twice daily for 5 days. Pt verbalized understanding. No further concerns.

## 2015-12-18 ENCOUNTER — Telehealth: Payer: Self-pay

## 2015-12-18 NOTE — Telephone Encounter (Signed)
Pt called to cancel Monday appt. She is inpatient at [facility]. She had abdominal bleed. She had kidney stents placed. The spinal cancer has recurred, they r/o spinal compression at present. Her MD at [facility] is [place]. She is cancelling 7/17 (and possibly 7/24). She thinks primary management may be transferred to Dr Ridel.

## 2015-12-21 ENCOUNTER — Other Ambulatory Visit: Payer: Federal, State, Local not specified - PPO

## 2015-12-21 ENCOUNTER — Ambulatory Visit: Payer: Federal, State, Local not specified - PPO | Admitting: Oncology

## 2015-12-28 ENCOUNTER — Encounter: Payer: Federal, State, Local not specified - PPO | Admitting: Nutrition

## 2015-12-28 ENCOUNTER — Ambulatory Visit: Payer: Federal, State, Local not specified - PPO

## 2015-12-28 ENCOUNTER — Other Ambulatory Visit: Payer: Federal, State, Local not specified - PPO

## 2016-01-03 ENCOUNTER — Other Ambulatory Visit: Payer: Self-pay

## 2016-01-03 ENCOUNTER — Encounter: Payer: Self-pay | Admitting: Emergency Medicine

## 2016-01-03 ENCOUNTER — Emergency Department
Admission: EM | Admit: 2016-01-03 | Discharge: 2016-01-04 | Disposition: A | Discharge status: Other Acute Inpatient Hospital | Payer: Federal, State, Local not specified - PPO | Attending: Emergency Medicine | Admitting: Emergency Medicine

## 2016-01-03 ENCOUNTER — Emergency Department: Payer: Federal, State, Local not specified - PPO

## 2016-01-03 DIAGNOSIS — N3001 Acute cystitis with hematuria: Secondary | ICD-10-CM | POA: Insufficient documentation

## 2016-01-03 DIAGNOSIS — Z8542 Personal history of malignant neoplasm of other parts of uterus: Secondary | ICD-10-CM | POA: Insufficient documentation

## 2016-01-03 DIAGNOSIS — I9589 Other hypotension: Secondary | ICD-10-CM

## 2016-01-03 DIAGNOSIS — Z79899 Other long term (current) drug therapy: Secondary | ICD-10-CM | POA: Diagnosis not present

## 2016-01-03 DIAGNOSIS — R531 Weakness: Secondary | ICD-10-CM | POA: Diagnosis present

## 2016-01-03 DIAGNOSIS — A419 Sepsis, unspecified organism: Secondary | ICD-10-CM | POA: Insufficient documentation

## 2016-01-03 LAB — URINALYSIS COMPLETE WITH MICROSCOPIC (ARMC ONLY)
Bilirubin Urine: NEGATIVE
Glucose, UA: NEGATIVE mg/dL
Ketones, ur: NEGATIVE mg/dL
Nitrite: NEGATIVE
PROTEIN: 100 mg/dL — AB
SPECIFIC GRAVITY, URINE: 1.01 (ref 1.005–1.030)
pH: 7 (ref 5.0–8.0)

## 2016-01-03 LAB — LACTIC ACID, PLASMA: LACTIC ACID, VENOUS: 2.1 mmol/L — AB (ref 0.5–1.9)

## 2016-01-03 LAB — CBC WITH DIFFERENTIAL/PLATELET
Basophils Absolute: 0.5 10*3/uL — ABNORMAL HIGH (ref 0–0.1)
Basophils Relative: 1 %
EOS ABS: 4.1 10*3/uL — AB (ref 0–0.7)
HCT: 25.8 % — ABNORMAL LOW (ref 35.0–47.0)
Hemoglobin: 8.5 g/dL — ABNORMAL LOW (ref 12.0–16.0)
LYMPHS ABS: 0.4 10*3/uL — AB (ref 1.0–3.6)
MCH: 29.1 pg (ref 26.0–34.0)
MCHC: 33.1 g/dL (ref 32.0–36.0)
MCV: 87.9 fL (ref 80.0–100.0)
Monocytes Absolute: 0.5 10*3/uL (ref 0.2–0.9)
Neutro Abs: 33.6 10*3/uL — ABNORMAL HIGH (ref 1.4–6.5)
Neutrophils Relative %: 86 %
PLATELETS: 210 10*3/uL (ref 150–440)
RBC: 2.93 MIL/uL — ABNORMAL LOW (ref 3.80–5.20)
RDW: 17.5 % — AB (ref 11.5–14.5)
WBC: 39.1 10*3/uL — ABNORMAL HIGH (ref 3.6–11.0)

## 2016-01-03 LAB — COMPREHENSIVE METABOLIC PANEL
ALT: 6 U/L — ABNORMAL LOW (ref 14–54)
ANION GAP: 9 (ref 5–15)
AST: 10 U/L — ABNORMAL LOW (ref 15–41)
Albumin: 2.2 g/dL — ABNORMAL LOW (ref 3.5–5.0)
Alkaline Phosphatase: 114 U/L (ref 38–126)
BUN: 23 mg/dL — ABNORMAL HIGH (ref 6–20)
CHLORIDE: 106 mmol/L (ref 101–111)
CO2: 16 mmol/L — ABNORMAL LOW (ref 22–32)
CREATININE: 1.13 mg/dL — AB (ref 0.44–1.00)
Calcium: 6.5 mg/dL — ABNORMAL LOW (ref 8.9–10.3)
GFR, EST AFRICAN AMERICAN: 60 mL/min — AB (ref >60)
GFR, EST NON AFRICAN AMERICAN: 52 mL/min — AB (ref >60)
Glucose, Bld: 122 mg/dL — ABNORMAL HIGH (ref 65–99)
POTASSIUM: 3.9 mmol/L (ref 3.5–5.1)
Sodium: 131 mmol/L — ABNORMAL LOW (ref 135–145)
Total Bilirubin: 1.2 mg/dL (ref 0.3–1.2)
Total Protein: 5.2 g/dL — ABNORMAL LOW (ref 6.5–8.1)

## 2016-01-03 LAB — MAGNESIUM: Magnesium: 1.6 mg/dL — ABNORMAL LOW (ref 1.7–2.4)

## 2016-01-03 LAB — TROPONIN I: TROPONIN I: 0.03 ng/mL — AB (ref <0.03)

## 2016-01-03 LAB — PROTIME-INR
INR: 1.27
PROTHROMBIN TIME: 16 s — AB (ref 11.4–15.2)

## 2016-01-03 LAB — LIPASE, BLOOD: LIPASE: 14 U/L (ref 11–51)

## 2016-01-03 MED ORDER — SODIUM CHLORIDE 0.9 % IV BOLUS (SEPSIS)
1000.0000 mL | Freq: Once | INTRAVENOUS | Status: AC
  Administered 2016-01-03: 1000 mL via INTRAVENOUS

## 2016-01-03 MED ORDER — SODIUM CHLORIDE 0.9 % IV BOLUS (SEPSIS)
500.0000 mL | Freq: Once | INTRAVENOUS | Status: AC
  Administered 2016-01-04: 500 mL via INTRAVENOUS

## 2016-01-03 MED ORDER — SODIUM CHLORIDE 0.9 % IV BOLUS (SEPSIS): 1000.0000 mL | INTRAVENOUS | Status: DC

## 2016-01-03 MED ORDER — PENTAFLUOROPROP-TETRAFLUOROETH EX AERO
INHALATION_SPRAY | CUTANEOUS | Status: AC
  Filled 2016-01-03: qty 30

## 2016-01-03 NOTE — ED Provider Notes (Signed)
[facility] Emergency Department Provider Note  ____________________________________________   First MD Initiated Contact with Patient 01/03/16 2148     (approximate)  I have reviewed the triage vital signs and the nursing notes.   HISTORY  Chief Complaint Weakness    HPI Erin Santiago is a 60 y.o. female doses of metastatic uterine leiomyosarcoma who is currently managed by oncologist Dr. Angelina Ok and his team at [facility].  She presents for evaluation of severe generalized weakness and inability to get up off the toilet or stand on her own.  She reports that she continues to have bleeding from her vagina which has been going on for an extended period of time.  She also reports an increase in her chronic diarrhea and decreased appetite and decreased oral intake of fluids and solids.  She had a dose of chemotherapy 5 days ago and was scheduled to see her oncologist in 2 more days.  She has been gradually feeling worse but her symptoms are severe today and nothing is making them better or worse.  She went to the bathroom and was unable to get up and with assistance was able to get to the shower to clean off the diarrhea, but she nearly passed out from the effort and from the lightheadedness.  When she arrived in the emergency department her blood pressure was noted to be 65/52 with repeated testing.  She denies fever/chills, chest pain, shortness of breath.  She has chronic abdominal pain.  She also has chronic, or at least frequent , electrolyte abnormalities including hypokalemia and hypomagnesemia.  I verified with the patient that she does have a DNR/DNI order in place and that that is consistent with her wishes.   Past Medical History:  Diagnosis Date  . Abnormal uterine bleeding   . Anemia   . Cancer ([place]) dx'd 07/2014   endometrial stromal sarcoma  . Fibroid   . Heart murmur    under 5 yrs of age  . Radiation 12/01/14-01/05/15   pelvis 45 gray  . Radiation  05/18/2015-06/05/15   T4 thoracic spine area 35 gray    Patient Active Problem List   Diagnosis Date Noted  . Protein-calorie malnutrition, severe 11/09/2015  . Chronic GI bleeding 11/06/2015  . Hypokalemia due to loss of potassium 11/06/2015  . Hyperbilirubinemia 11/06/2015  . Hypoalbuminemia due to protein-calorie malnutrition ([place]) 11/06/2015  . Fever 11/06/2015  . Chronic renal insufficiency 11/06/2015  . Cancer associated pain 11/06/2015  . Diarrhea 11/06/2015  . Hypotension 11/06/2015  . Sepsis ([place]) 11/06/2015  . Anemia associated with chemotherapy 11/06/2015  . Other hydronephrosis 10/30/2015  . Physical deconditioning 10/04/2015  . Abdominal abscess ([place]) 10/04/2015  . Pelvic abscess in female 10/04/2015  . Perforation bowel ([place]) 10/04/2015  . Pressure ulcer 08/24/2015  . Protein-calorie malnutrition, moderate ([place]) 08/09/2015  . Abscess of abdominal cavity ([place])   . Intra-abdominal abscess ([place])   . Diverticulitis of large intestine without perforation or abscess without bleeding 06/27/2015  . Metastatic cancer to spine ([place]) 05/12/2015  . 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 Port catheter in place 02/07/2015  . 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 Bilateral leg edema 11/08/2014  . SOB (shortness of breath) on exertion 11/08/2014  . Skin changes related to chemotherapy 10/10/2014  . Thrush 09/16/2014  . Dehydration   . Myalgia 09/08/2014  . Mucositis due to chemotherapy 09/08/2014  . Morbid obesity with BMI of 40.0-44.9, adult ([place]) 08/20/2014  . Hx  of laparoscopic gastric banding 08/20/2014  . Iron deficiency anemia due to chronic blood loss 08/20/2014  . Alopecia 08/20/2014  . Arthritis of both knees 08/20/2014  . 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 Leukocytosis   . Vaginal bleeding 07/04/2014  . Fibroid 07/04/2014  . Postmenopausal vaginal bleeding 07/04/2014    Past Surgical History:  Procedure  Laterality Date  . ABDOMINAL HYSTERECTOMY  07/2014   at [facility]  . LAPAROSCOPIC GASTRIC SLEEVE RESECTION  2011    Prior to Admission medications   Medication Sig Start Date End Date Taking? Authorizing Provider  acetaminophen (TYLENOL) 500 MG tablet Take 1,000 mg by mouth every 6 (six) hours as needed for moderate pain.    Historical Provider, MD  cholestyramine (QUESTRAN) 4 g packet Take 1 packet by mouth 2 (two) times daily. Reported on 12/10/2015 09/28/15   Historical Provider, MD  diphenoxylate-atropine (LOMOTIL) 2.5-0.025 MG tablet Take 1 tablet by mouth 2 (two) times daily. 09/28/15   Historical Provider, MD  feeding supplement (BOOST / RESOURCE BREEZE) LIQD Take 1 Container by mouth 3 (three) times daily between meals. 11/10/15   Domenic Polite, MD  ferrous fumarate (HEMOCYTE - 106 MG FE) 325 (106 Fe) MG TABS tablet Take 1 tablet (106 mg of iron total) by mouth daily. 10/19/15   Lennis Marion Downer, MD  lidocaine-prilocaine (EMLA) cream Apply to Porta-cath 1-2 hrs prior to access. Cover with [employer]. 08/18/14   Lennis Marion Downer, MD  meloxicam (MOBIC) 15 MG tablet Take 15 mg by mouth daily.    Historical Provider, MD  nitrofurantoin, macrocrystal-monohydrate, (MACROBID) 100 MG capsule Take 1 capsule (100 mg total) by mouth 2 (two) times daily. For 5 days for UTI 12/13/15   Gordy Levan, MD  ondansetron (ZOFRAN) 8 MG tablet Take 1 tablet (8 mg total) by mouth every 8 (eight) hours as needed for nausea or vomiting. Patient not taking: Reported on 11/19/2015 10/30/15   Gordy Levan, MD  oxyCODONE (OXY IR/ROXICODONE) 5 MG immediate release tablet Take 1-2 tablets (5-10 mg total) by mouth every 4 (four) hours as needed for moderate pain, severe pain or breakthrough pain. Patient not taking: Reported on 10/19/2015 08/27/15   Eugenie Filler, MD  pantoprazole (PROTONIX) 40 MG tablet Take 40 mg by mouth daily.    Historical Provider, MD  potassium chloride 20 MEQ/15ML (10%) SOLN Take 15 mLs (20 mEq total)  by mouth 2 (two) times daily. 12/10/15   Lennis Marion Downer, MD  Vitamin K, Phytonadione, 100 MCG TABS Take 1 tablet (100 mcg total) by mouth once. To help prevent bleeding. 12/10/15   Lennis Marion Downer, MD    Allergies Review of patient's allergies indicates no known allergies.  Family History  Problem Relation Age of Onset  . Hypertension Mother   . Diabetes Mother   . Non-Hodgkin's lymphoma Mother 42  . Thyroid disease Father   . Heart attack Father   . Other Father     enlarged heart    Social History Social History  Substance Use Topics  . Smoking status: Never Smoker  . Smokeless tobacco: Never Used  . Alcohol use No    Review of Systems Constitutional: No fever/chills Eyes: No visual changes. ENT: No sore throat. Cardiovascular: Denies chest pain. Respiratory: Denies shortness of breath. Gastrointestinal: Chronic abd pain, nausea, severe acute on chronic diarrhea Genitourinary: Negative for dysuria. Musculoskeletal: Negative for back pain. Skin: Negative for rash. Neurological: Negative for headaches, focal weakness or numbness.  Severe generalized  weakness, unable to stand or walk on her own  10-point ROS otherwise negative.  ____________________________________________   PHYSICAL EXAM:  VITAL SIGNS: ED Triage Vitals  Enc Vitals Group     BP --      Pulse Rate 01/03/16 2133 (!) 107     Resp 01/03/16 2133 20     Temp 01/03/16 2133 98.2 F (36.8 C)     Temp src --      SpO2 01/03/16 2133 100 %     Weight 01/03/16 2133 180 lb (81.6 kg)     Height 01/03/16 2133 5' (1.524 m)     Head Circumference --      Peak Flow --      Pain Score 01/03/16 2135 4     Pain Loc --      Pain Edu? --      Excl. in [place]? --     Constitutional: Has the appearance of severe chronic illness.  Ill-appearing at this time but alert and oriented and conversant. Eyes: Conjunctivae are normal. PERRL. EOMI. Head: Atraumatic. Nose: No congestion/rhinnorhea. Mouth/Throat: Mucous  membranes are dry. Neck: No stridor.  No meningeal signs.   Cardiovascular: Normal rate, regular rhythm. Port-a-cath in right chest.  Grossly normal heart sounds.   Respiratory: Normal respiratory effort.  No retractions. Lungs CTAB. Gastrointestinal: Soft , mild distention.  Bilateral nephrostomy tubes.  Two abdominal fistulas draining into bags.   Moderate tenderness throughout abdomen. Musculoskeletal: No lower extremity tenderness nor edema. No gross deformities of extremities. Neurologic:  Normal speech and language. No gross focal neurologic deficits are appreciated.  Skin:  Skin is pale, warm, dry and intact. No rash noted.   ____________________________________________   LABS (all labs ordered are listed, but only abnormal results are displayed)  Labs Reviewed - No data to display (pending) ____________________________________________  EKG  ED ECG REPORT I, Jiana Lemaire, the attending physician, personally viewed and interpreted this ECG.  Date: 01/03/2016 EKG Time: 21:55 Rate: 98 Rhythm: normal sinus rhythm QRS Axis: normal Intervals: normal ST/T Wave abnormalities: normal Conduction Disturbances: none Narrative Interpretation: unremarkable  ____________________________________________  RADIOLOGY   No results found.  ____________________________________________   PROCEDURES  Procedure(s) performed:   Procedures   ____________________________________________   INITIAL IMPRESSION / ASSESSMENT AND PLAN / ED COURSE  Pertinent labs & imaging results that were available during my care of the patient were reviewed by me and considered in my medical decision making (see chart for details).  The patient is ill-appearing but alert, oriented, and conversant.  She is significantly hypotensive.  I reviewed her recent clinic note and saw that her blood pressure at that time was 90/59 but her current blood pressure of 65/52 is obviously concerning.  In the  setting of decreased oral intake and increased diarrhea, she is likely volume depleted and I will begin volume resuscitation with a goal of 30 mL/kg.  I will not start empiric antibiotics at this time as she does not meet sepsis criteria and I think general decline to volume depletion is more likely.  Of note her clinic note did not indicate she had abdominal tenderness and she is tender today.  I discussed with her her plan of care and she would prefer to be transferred to [facility].  I will wait for her lab work to come back and then anticipate contacting the do transfer center for the patient's request and for continued care by those familiar with the patient.  11:00 PM:  Transferring ED care to  Dr. Dahlia Client to follow up labs and likely arrange transfer to [facility] as needed.  Clinical Course    ____________________________________________  FINAL CLINICAL IMPRESSION(S) / ED DIAGNOSES  Generalized weakness Hypotension   MEDICATIONS GIVEN DURING THIS VISIT:  Medications - No data to display   NEW OUTPATIENT MEDICATIONS STARTED DURING THIS VISIT:  New Prescriptions   No medications on file      Note:  This document was prepared using Dragon voice recognition software and may include unintentional dictation errors.    Hinda Kehr, MD 01/04/16 [phone]

## 2016-01-03 NOTE — ED Notes (Signed)
Pt reports she is still receiving chemo therapy treatments

## 2016-01-03 NOTE — ED Notes (Signed)
Pt c/o weakness and near syncope at 1800. 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

## 2016-01-03 NOTE — ED Notes (Signed)
Pt brought back to room d/t bp while sitting was 80/40.

## 2016-01-04 LAB — LACTIC ACID, PLASMA: LACTIC ACID, VENOUS: 1.3 mmol/L (ref 0.5–1.9)

## 2016-01-04 MED ORDER — DEXTROSE 5 % IV SOLN
INTRAVENOUS | Status: AC
  Filled 2016-01-04: qty 10

## 2016-01-04 MED ORDER — KETOROLAC TROMETHAMINE 30 MG/ML IJ SOLN
30.0000 mg | Freq: Once | INTRAMUSCULAR | Status: AC
  Administered 2016-01-04: 30 mg via INTRAVENOUS
  Filled 2016-01-04: qty 1

## 2016-01-04 MED ORDER — SODIUM CHLORIDE 0.9 % IV BOLUS (SEPSIS)
500.0000 mL | Freq: Once | INTRAVENOUS | Status: AC
Start: 2016-01-04 — End: 2016-01-04
  Administered 2016-01-04: 500 mL via INTRAVENOUS

## 2016-01-04 MED ORDER — DEXTROSE 5 % IV SOLN
1.0000 g | Freq: Once | INTRAVENOUS | Status: AC
  Administered 2016-01-04: 1 g via INTRAVENOUS
  Filled 2016-01-04: qty 10

## 2016-01-04 NOTE — ED Provider Notes (Signed)
-----------------------------------------   2:11 AM on 01/04/2016 -----------------------------------------   Blood pressure (!) 103/55, pulse 95, temperature 98.2 F (36.8 C), resp. rate 12, height 5' (1.524 m), weight 180 lb (81.6 kg), last menstrual period 02/04/2014, SpO2 99 %.  Assuming care from Dr. Karma Greaser.  In short, Erin Santiago is a 60 y.o. female with a chief complaint of Weakness .  Refer to the original H&P for additional details.  The current plan of care is to follow up the results of the blood work and disposition the patient.  Urinalysis    Component Value Date/Time   COLORURINE YELLOW (A) 01/03/2016 2313   APPEARANCEUR CLOUDY (A) 01/03/2016 2313   LABSPEC 1.010 01/03/2016 2313   LABSPEC 1.020 12/10/2015 1132   PHURINE 7.0 01/03/2016 2313   GLUCOSEU NEGATIVE 01/03/2016 2313   GLUCOSEU Negative 12/10/2015 1132   HGBUR 1+ (A) 01/03/2016 2313   BILIRUBINUR NEGATIVE 01/03/2016 2313   BILIRUBINUR Negative 12/10/2015 1132   KETONESUR NEGATIVE 01/03/2016 2313   PROTEINUR 100 (A) 01/03/2016 2313   UROBILINOGEN 0.2 12/10/2015 1132   NITRITE NEGATIVE 01/03/2016 2313   LEUKOCYTESUR 3+ (A) 01/03/2016 2313   LEUKOCYTESUR Moderate 12/10/2015 1132    Looking at the patient's urinalysis it appears as though she has a urinary tract infection. The patient also has an elevated white blood cell count but that is not abnormal. The remainder of the patient's blood work is fairly unremarkable. She does have a lactic acid of 2.1 and a troponin of 0.03. I did give the patient a dose of ceftriaxone as well as a 500 mL bolus of normal saline. The patient's blood pressure did normalize. I contacted [facility] to transfer the patient and she was accepted in transfer. The patient will be transferred to [facility] for further treatment of her urinary tract infection and sepsis.  ED Sepsis - Repeat Assessment   Performed at:    01/04/16 0220  Last Vitals:    Blood pressure (!) 103/55, pulse 95, temperature  98.2 F (36.8 C), resp. rate 12, height 5' (1.524 m), weight 180 lb (81.6 kg), last menstrual period 02/04/2014, SpO2 99 %.  Heart:      Regular rate and rhythm, systolic murmur  Lungs:     Clear to auscultation bilaterally  Capillary Refill:   Less than 2 seconds  Peripheral Pulse (include location): 2+ in bilateral radial arteries, bilateral pitting edema   Skin (include color):   Pale  The patient will be transferred to [facility].    Loney Hering, MD 01/04/16 [phone]

## 2016-01-04 NOTE — ED Notes (Signed)
Dressing applied to leaking wound to right lower quadrant. Pt repositioned in bed for comfort.

## 2016-01-04 NOTE — ED Notes (Signed)
Report from [place], rn. Pt updated on transfer process. Pt and friend at bedside verbalize understanding. Call bell at side. Pt denies needs at this time.

## 2016-01-04 NOTE — ED Notes (Signed)
Pt assisted up to bedside commode for urine void. Pt refused to use bedpan.

## 2016-01-05 LAB — BLOOD CULTURE ID PANEL (REFLEXED)
ACINETOBACTER BAUMANNII: NOT DETECTED
CANDIDA KRUSEI: NOT DETECTED
CARBAPENEM RESISTANCE: NOT DETECTED
Candida albicans: NOT DETECTED
Candida glabrata: NOT DETECTED
Candida parapsilosis: NOT DETECTED
Candida tropicalis: NOT DETECTED
ENTEROBACTERIACEAE SPECIES: NOT DETECTED
ESCHERICHIA COLI: NOT DETECTED
Enterobacter cloacae complex: NOT DETECTED
Enterococcus species: NOT DETECTED
Haemophilus influenzae: NOT DETECTED
KLEBSIELLA OXYTOCA: NOT DETECTED
Klebsiella pneumoniae: NOT DETECTED
Listeria monocytogenes: NOT DETECTED
Methicillin resistance: NOT DETECTED
NEISSERIA MENINGITIDIS: NOT DETECTED
PSEUDOMONAS AERUGINOSA: NOT DETECTED
Proteus species: NOT DETECTED
STAPHYLOCOCCUS SPECIES: NOT DETECTED
STREPTOCOCCUS AGALACTIAE: NOT DETECTED
STREPTOCOCCUS SPECIES: NOT DETECTED
Serratia marcescens: NOT DETECTED
Staphylococcus aureus (BCID): NOT DETECTED
Streptococcus pneumoniae: NOT DETECTED
Streptococcus pyogenes: NOT DETECTED
Vancomycin resistance: NOT DETECTED

## 2016-01-05 NOTE — Progress Notes (Signed)
PHARMACY - PHYSICIAN COMMUNICATION CRITICAL VALUE ALERT - BLOOD CULTURE IDENTIFICATION (BCID)  Recent Results (from the past 240 hour(s))  Blood Culture (routine x 2)     Status: None (Preliminary result)   Collection Time: 01/03/16 10:17 PM  Result Value Ref Range Status   Specimen Description BLOOD PORTA CATH  Final   Special Requests   Final    BOTTLES DRAWN AEROBIC AND ANAEROBIC [id number]   Culture  Setup Time   Final    GRAM NEGATIVE RODS ANAEROBIC BOTTLE ONLY CRITICAL RESULT CALLED TO, READ BACK BY AND VERIFIED WITH: [place] ON 01/05/16 A 0641 BY TLB CONFIRMED BY TLB/CAF Organism ID to follow    Culture   Final    GRAM NEGATIVE RODS ANAEROBIC BOTTLE ONLY IDENTIFICATION TO FOLLOW    Report Status PENDING  Incomplete  Blood Culture ID Panel (Reflexed)     Status: None   Collection Time: 01/03/16 10:17 PM  Result Value Ref Range Status   Enterococcus species NOT DETECTED NOT DETECTED Final   Vancomycin resistance NOT DETECTED NOT DETECTED Final   Listeria monocytogenes NOT DETECTED NOT DETECTED Final   Staphylococcus species NOT DETECTED NOT DETECTED Final   Staphylococcus aureus NOT DETECTED NOT DETECTED Final   Methicillin resistance NOT DETECTED NOT DETECTED Final   Streptococcus species NOT DETECTED NOT DETECTED Final   Streptococcus agalactiae NOT DETECTED NOT DETECTED Final   Streptococcus pneumoniae NOT DETECTED NOT DETECTED Final   Streptococcus pyogenes NOT DETECTED NOT DETECTED Final   Acinetobacter baumannii NOT DETECTED NOT DETECTED Final   Enterobacteriaceae species NOT DETECTED NOT DETECTED Final   Enterobacter cloacae complex NOT DETECTED NOT DETECTED Final   Escherichia coli NOT DETECTED NOT DETECTED Final   Klebsiella oxytoca NOT DETECTED NOT DETECTED Final   Klebsiella pneumoniae NOT DETECTED NOT DETECTED Final   Proteus species NOT DETECTED NOT DETECTED Final   Serratia marcescens NOT DETECTED NOT DETECTED Final   Carbapenem resistance NOT  DETECTED NOT DETECTED Final   Haemophilus influenzae NOT DETECTED NOT DETECTED Final   Neisseria meningitidis NOT DETECTED NOT DETECTED Final   Pseudomonas aeruginosa NOT DETECTED NOT DETECTED Final   Candida albicans NOT DETECTED NOT DETECTED Final   Candida glabrata NOT DETECTED NOT DETECTED Final   Candida krusei NOT DETECTED NOT DETECTED Final   Candida parapsilosis NOT DETECTED NOT DETECTED Final   Candida tropicalis NOT DETECTED NOT DETECTED Final  Blood Culture (routine x 2)     Status: None (Preliminary result)   Collection Time: 01/03/16 11:13 PM  Result Value Ref Range Status   Specimen Description BLOOD PORTA CATH  Final   Special Requests   Final    BOTTLES DRAWN AEROBIC AND ANAEROBIC [place]   Culture NO GROWTH 2 DAYS  Final   Report Status PENDING  Incomplete    Name of physician (or Provider) Contacted: Patient transferred to [facility]. Spoke with oncologists office, will fax results  Cheri Guppy 01/05/2016  10:39 AM

## 2016-01-06 LAB — URINE CULTURE: Culture: >=100000 — AB

## 2016-01-07 LAB — BLOOD CULTURE ID PANEL (REFLEXED)
ACINETOBACTER BAUMANNII: NOT DETECTED
CANDIDA GLABRATA: NOT DETECTED
CANDIDA TROPICALIS: NOT DETECTED
CARBAPENEM RESISTANCE: NOT DETECTED
Candida albicans: NOT DETECTED
Candida krusei: NOT DETECTED
Candida parapsilosis: NOT DETECTED
ENTEROCOCCUS SPECIES: NOT DETECTED
Enterobacter cloacae complex: NOT DETECTED
Enterobacteriaceae species: DETECTED — AB
Escherichia coli: DETECTED — AB
HAEMOPHILUS INFLUENZAE: NOT DETECTED
Klebsiella oxytoca: NOT DETECTED
Klebsiella pneumoniae: NOT DETECTED
LISTERIA MONOCYTOGENES: NOT DETECTED
METHICILLIN RESISTANCE: NOT DETECTED
NEISSERIA MENINGITIDIS: NOT DETECTED
PROTEUS SPECIES: NOT DETECTED
Pseudomonas aeruginosa: NOT DETECTED
SERRATIA MARCESCENS: NOT DETECTED
STAPHYLOCOCCUS SPECIES: NOT DETECTED
STREPTOCOCCUS PYOGENES: NOT DETECTED
STREPTOCOCCUS SPECIES: NOT DETECTED
Staphylococcus aureus (BCID): NOT DETECTED
Streptococcus agalactiae: NOT DETECTED
Streptococcus pneumoniae: NOT DETECTED
VANCOMYCIN RESISTANCE: NOT DETECTED

## 2016-01-07 NOTE — Progress Notes (Addendum)
ED Antimicrobial Stewardship Positive Culture Follow Up   Erin Santiago is an 59 y.o. female who presented to [facility] on 01/03/2016 with a chief complaint of  Chief Complaint  Patient presents with  . Weakness    Recent Results (from the past 720 hour(s))  Urine Culture     Status: Abnormal   Collection Time: 12/10/15 11:32 AM  Result Value Ref Range Status   Urine Culture, Routine Final report (A)  Final   Urine Culture result 1 Escherichia coli (A)  Final    Comment: 50,000-100,000 colony forming units per mL   ANTIMICROBIAL SUSCEPTIBILITY Comment  Final    Comment:       ** S = Susceptible; I = Intermediate; R = Resistant **                    P = Positive; N = Negative             MICS are expressed in micrograms per mL    Antibiotic                 RSLT#1    RSLT#2    RSLT#3    RSLT#4 Amoxicillin/Clavulanic Acid    I Ampicillin                     R Cefepime                       S Ceftriaxone                    S Cefuroxime                     I Cephalothin                    I Ciprofloxacin                  R Ertapenem                      S Gentamicin                     S Imipenem                       S Levofloxacin                   R Nitrofurantoin                 S Piperacillin                   R Tetracycline                   S Tobramycin                     S Trimethoprim/Sulfa             S   Blood Culture (routine x 2)     Status: Abnormal (Preliminary result)   Collection Time: 01/03/16 10:17 PM  Result Value Ref Range Status   Specimen Description BLOOD PORTA CATH  Final   Special Requests   Final    BOTTLES DRAWN AEROBIC AND ANAEROBIC 18CCAERO,14CCANA   Culture  Setup Time   Final    GRAM NEGATIVE RODS IN BOTH AEROBIC AND ANAEROBIC BOTTLES CRITICAL RESULT  CALLED TO, READ BACK BY AND VERIFIED WITH: NATE COOKSON ON 01/05/16 A 0641 BY TLB CRITICAL RESULT CALLED TO, READ BACK BY AND VERIFIED WITH: Rayna Sexton 01/07/16 1130 KLW    Culture (A)  Final   ANAEROBIC GRAM NEGATIVE ROD CULTURE REINCUBATED FOR BETTER GROWTH GRAM NEGATIVE RODS 2ND GNR = E.COLI    Report Status PENDING  Incomplete  Blood Culture ID Panel (Reflexed)     Status: None   Collection Time: 01/03/16 10:17 PM  Result Value Ref Range Status   Enterococcus species NOT DETECTED NOT DETECTED Final   Vancomycin resistance NOT DETECTED NOT DETECTED Final   Listeria monocytogenes NOT DETECTED NOT DETECTED Final   Staphylococcus species NOT DETECTED NOT DETECTED Final   Staphylococcus aureus NOT DETECTED NOT DETECTED Final   Methicillin resistance NOT DETECTED NOT DETECTED Final   Streptococcus species NOT DETECTED NOT DETECTED Final   Streptococcus agalactiae NOT DETECTED NOT DETECTED Final   Streptococcus pneumoniae NOT DETECTED NOT DETECTED Final   Streptococcus pyogenes NOT DETECTED NOT DETECTED Final   Acinetobacter baumannii NOT DETECTED NOT DETECTED Final   Enterobacteriaceae species NOT DETECTED NOT DETECTED Final   Enterobacter cloacae complex NOT DETECTED NOT DETECTED Final   Escherichia coli NOT DETECTED NOT DETECTED Final   Klebsiella oxytoca NOT DETECTED NOT DETECTED Final   Klebsiella pneumoniae NOT DETECTED NOT DETECTED Final   Proteus species NOT DETECTED NOT DETECTED Final   Serratia marcescens NOT DETECTED NOT DETECTED Final   Carbapenem resistance NOT DETECTED NOT DETECTED Final   Haemophilus influenzae NOT DETECTED NOT DETECTED Final   Neisseria meningitidis NOT DETECTED NOT DETECTED Final   Pseudomonas aeruginosa NOT DETECTED NOT DETECTED Final   Candida albicans NOT DETECTED NOT DETECTED Final   Candida glabrata NOT DETECTED NOT DETECTED Final   Candida krusei NOT DETECTED NOT DETECTED Final   Candida parapsilosis NOT DETECTED NOT DETECTED Final   Candida tropicalis NOT DETECTED NOT DETECTED Final  Blood Culture ID Panel (Reflexed)     Status: Abnormal   Collection Time: 01/03/16 10:17 PM  Result Value Ref Range Status   Enterococcus species NOT  DETECTED NOT DETECTED Final   Vancomycin resistance NOT DETECTED NOT DETECTED Final   Listeria monocytogenes NOT DETECTED NOT DETECTED Final   Staphylococcus species NOT DETECTED NOT DETECTED Final   Staphylococcus aureus NOT DETECTED NOT DETECTED Final   Methicillin resistance NOT DETECTED NOT DETECTED Final   Streptococcus species NOT DETECTED NOT DETECTED Final   Streptococcus agalactiae NOT DETECTED NOT DETECTED Final   Streptococcus pneumoniae NOT DETECTED NOT DETECTED Final   Streptococcus pyogenes NOT DETECTED NOT DETECTED Final   Acinetobacter baumannii NOT DETECTED NOT DETECTED Final   Enterobacteriaceae species DETECTED (A) NOT DETECTED Final    Comment: CRITICAL RESULT CALLED TO, READ BACK BY AND VERIFIED WITH: Rayna Sexton 01/07/16 1130 KLW    Enterobacter cloacae complex NOT DETECTED NOT DETECTED Final   Escherichia coli DETECTED (A) NOT DETECTED Final    Comment: CRITICAL RESULT CALLED TO, READ BACK BY AND VERIFIED WITH: Rayna Sexton 01/07/16 1130 KLW    Klebsiella oxytoca NOT DETECTED NOT DETECTED Final   Klebsiella pneumoniae NOT DETECTED NOT DETECTED Final   Proteus species NOT DETECTED NOT DETECTED Final   Serratia marcescens NOT DETECTED NOT DETECTED Final   Carbapenem resistance NOT DETECTED NOT DETECTED Final   Haemophilus influenzae NOT DETECTED NOT DETECTED Final   Neisseria meningitidis NOT DETECTED NOT DETECTED Final   Pseudomonas aeruginosa NOT DETECTED NOT DETECTED Final  Candida albicans NOT DETECTED NOT DETECTED Final   Candida glabrata NOT DETECTED NOT DETECTED Final   Candida krusei NOT DETECTED NOT DETECTED Final   Candida parapsilosis NOT DETECTED NOT DETECTED Final   Candida tropicalis NOT DETECTED NOT DETECTED Final  Blood Culture (routine x 2)     Status: None (Preliminary result)   Collection Time: 01/03/16 11:13 PM  Result Value Ref Range Status   Specimen Description BLOOD PORTA CATH  Final   Special Requests   Final    BOTTLES DRAWN AEROBIC AND  ANAEROBIC 17CCAERO,13CCANA   Culture NO GROWTH 4 DAYS  Final   Report Status PENDING  Incomplete  Urine culture     Status: Abnormal   Collection Time: 01/03/16 11:13 PM  Result Value Ref Range Status   Specimen Description URINE, RANDOM  Final   Special Requests Immunocompromised  Final   Culture >=100,000 COLONIES/mL KLEBSIELLA OXYTOCA (A)  Final   Report Status 01/06/2016 FINAL  Final   Organism ID, Bacteria KLEBSIELLA OXYTOCA (A)  Final      Susceptibility   Klebsiella oxytoca - MIC*    AMPICILLIN >=32 RESISTANT Resistant     CEFAZOLIN >=64 RESISTANT Resistant     CEFTRIAXONE <=1 SENSITIVE Sensitive     CIPROFLOXACIN <=0.25 SENSITIVE Sensitive     GENTAMICIN <=1 SENSITIVE Sensitive     IMIPENEM <=0.25 SENSITIVE Sensitive     NITROFURANTOIN 32 SENSITIVE Sensitive     TRIMETH/SULFA <=20 SENSITIVE Sensitive     AMPICILLIN/SULBACTAM 16 INTERMEDIATE Intermediate     PIP/TAZO <=4 SENSITIVE Sensitive     Extended ESBL NEGATIVE Sensitive     * >=100,000 COLONIES/mL KLEBSIELLA OXYTOCA    Patient was transferred to [facility] from [facility] on 07/31 and is being followed by oncology. Per previous note (07/31), cultures were faxed to [phone]. Faxed today's cultures to this number. Will follow up to assure cultures were received.   Darrow Bussing, PharmD Pharmacy Resident 01/07/2016 1:07 PM

## 2016-01-08 LAB — CULTURE, BLOOD (ROUTINE X 2): CULTURE: NO GROWTH

## 2016-01-11 LAB — CULTURE, BLOOD (ROUTINE X 2)

## 2016-01-13 NOTE — Progress Notes (Signed)
sensitivites for pt blood cx resulted. Results were faxed to oncology office at [place], Pharm.D Clinical Pharmacist

## 2016-01-21 ENCOUNTER — Other Ambulatory Visit: Payer: Self-pay | Admitting: Oncology

## 2016-01-25 ENCOUNTER — Other Ambulatory Visit: Payer: Federal, State, Local not specified - PPO

## 2016-01-25 ENCOUNTER — Ambulatory Visit: Payer: Federal, State, Local not specified - PPO

## 2016-03-06 DEATH — deceased

## 2016-04-05 IMAGING — CT CT ABD-PELV W/ CM
1 of 2 series · 14 of 32 positions shown, 18 images · IV contrast (OMNIPAQUE)
Comparison: 08/12/2015, 08/11/2015

CLINICAL DATA: Re-evaluate intra-abdominal abscess

EXAM:
CT ABDOMEN AND PELVIS WITH CONTRAST
TECHNIQUE: Multidetector CT imaging of the abdomen and pelvis was performed
using the standard protocol following bolus administration of
intravenous contrast.
CONTRAST:  100mL OMNIPAQUE IOHEXOL 300 MG/ML  SOLN

[Series 2: rtn a/p with · axial · 0.93mm/px · z∈[-480,-40]mm · 14 of 98 slices shown, 18 images]
[im 5/98  soft-tissue]
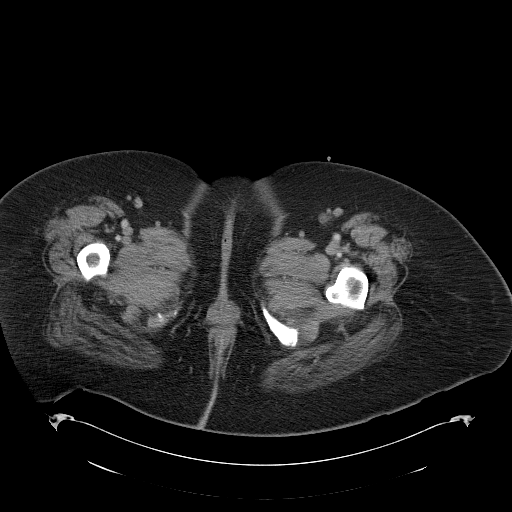
[im 5/98  bone]
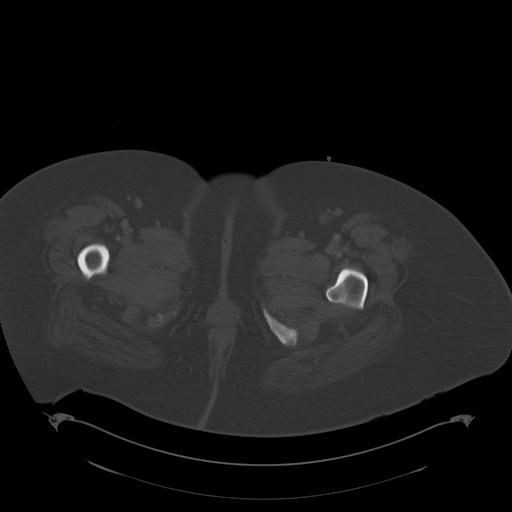
[im 13/98  soft-tissue]
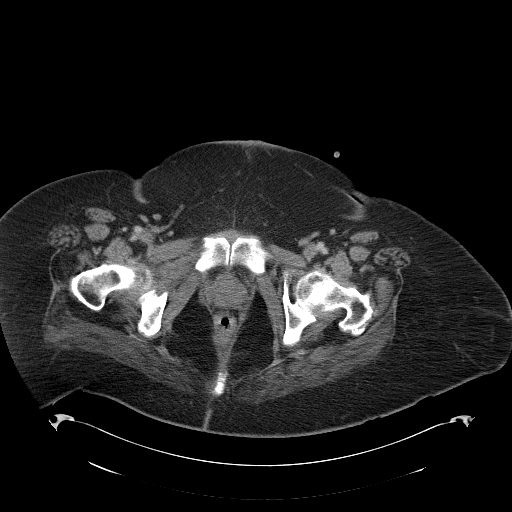
[im 21/98  soft-tissue]
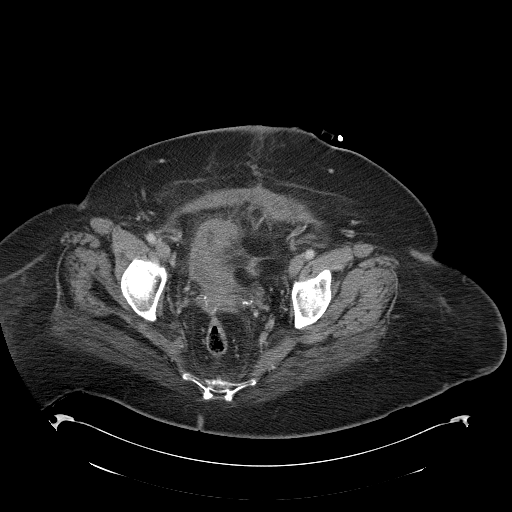
[im 29/98  soft-tissue]
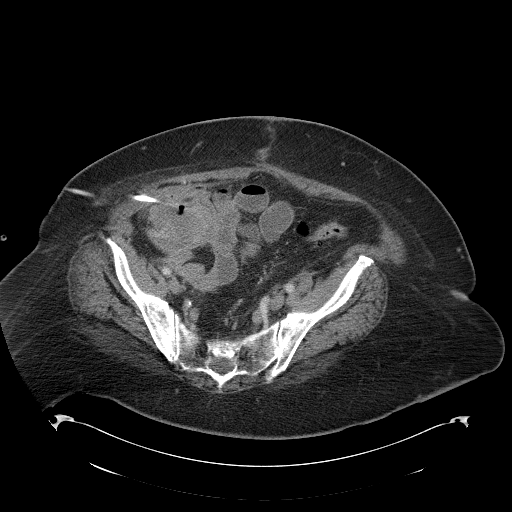
[im 37/98  soft-tissue]
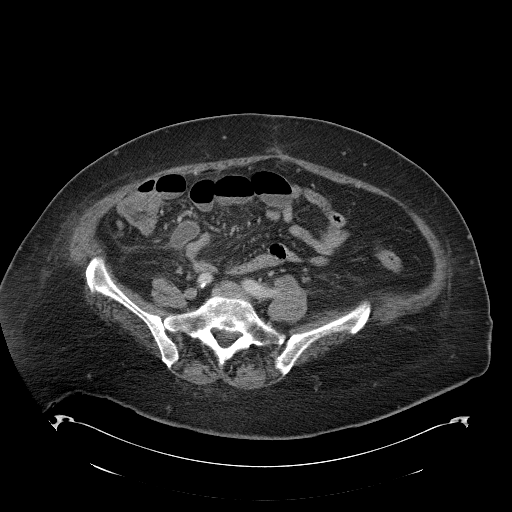
[im 45/98  soft-tissue]
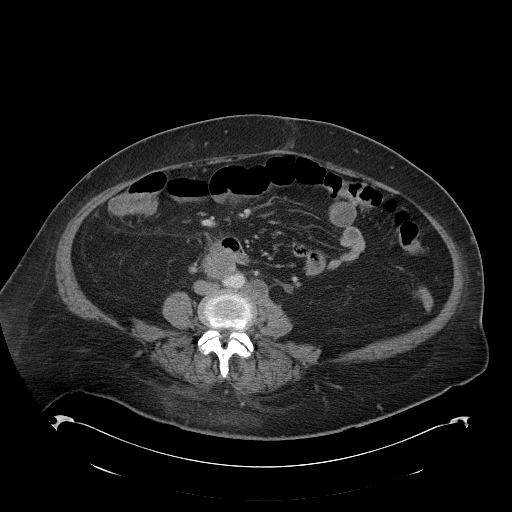
[im 53/98  soft-tissue]
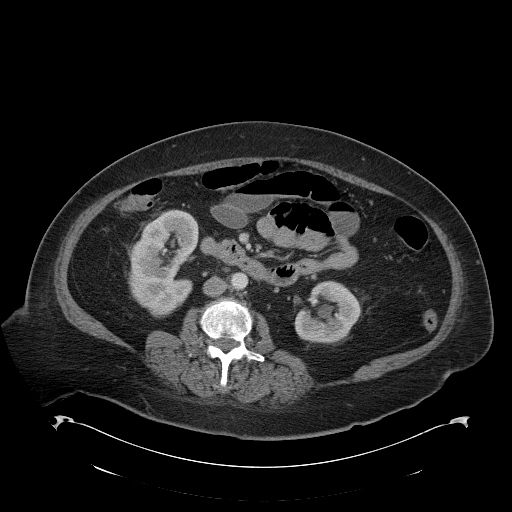
[im 61/98  soft-tissue]
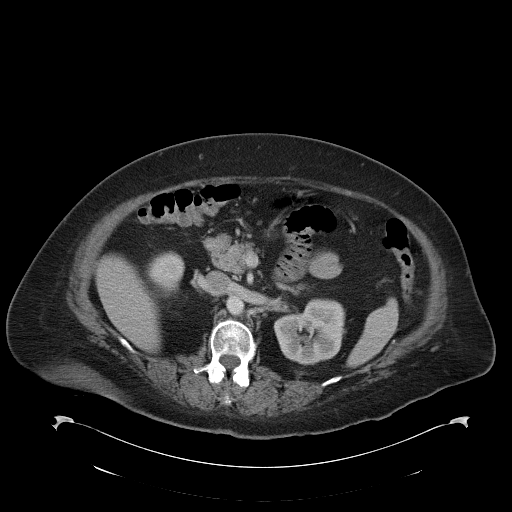
[im 69/98  soft-tissue]
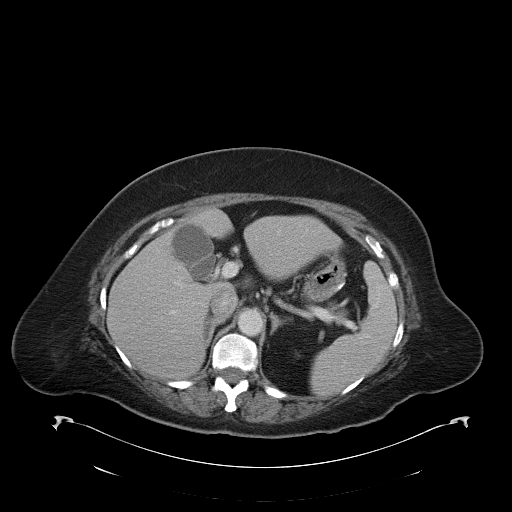
[im 69/98  bone]
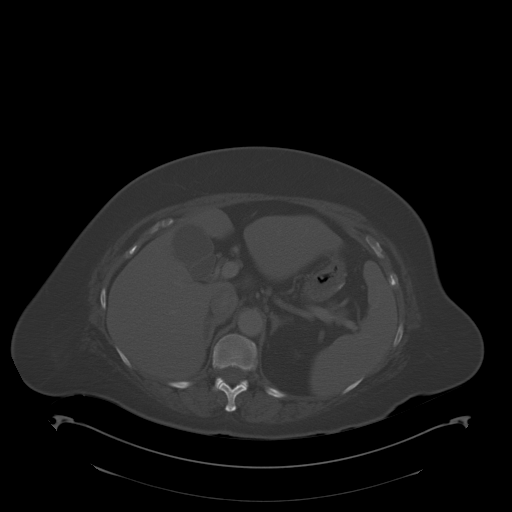
[im 77/98  soft-tissue]
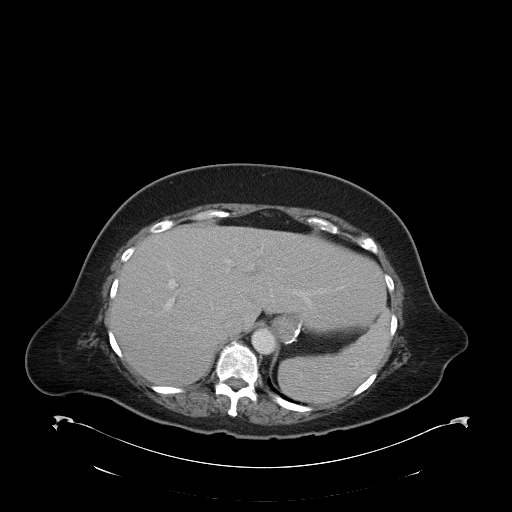
[im 81/98  lung]
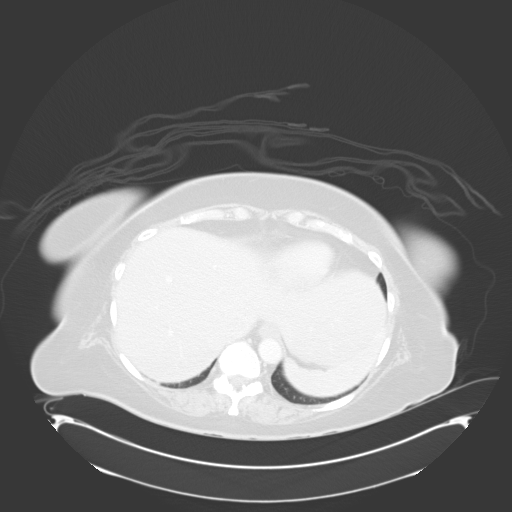
[im 85/98  soft-tissue]
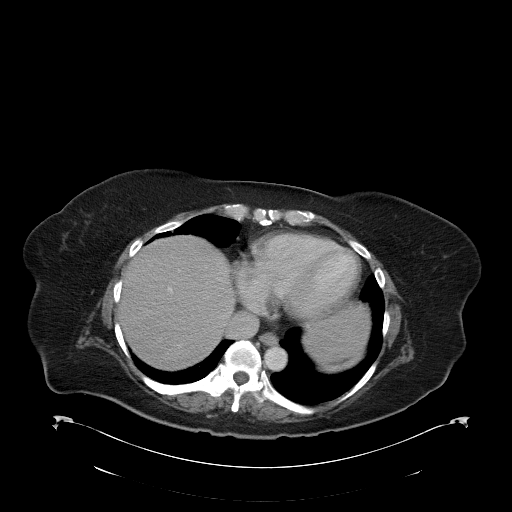
[im 85/98  lung]
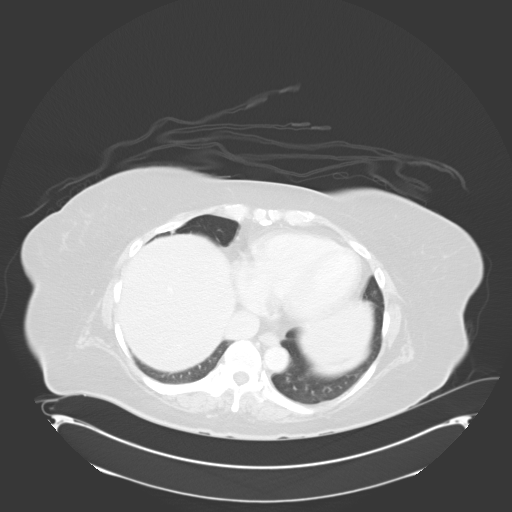
[im 89/98  lung]
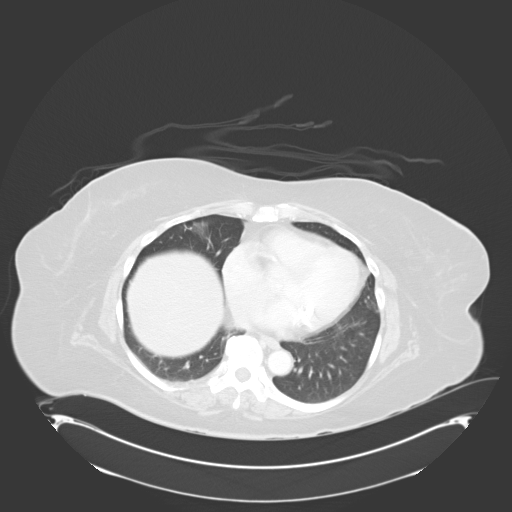
[im 93/98  soft-tissue]
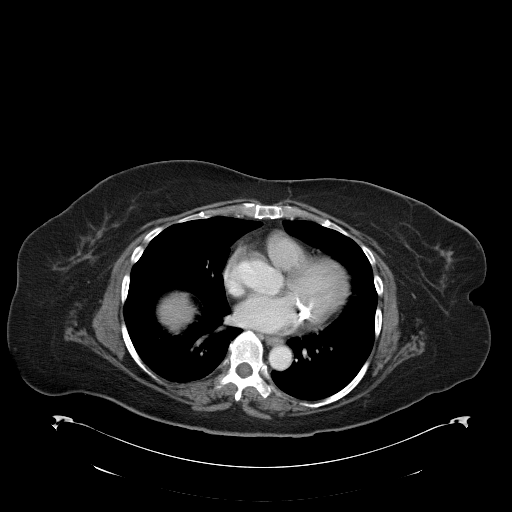
[im 93/98  lung]
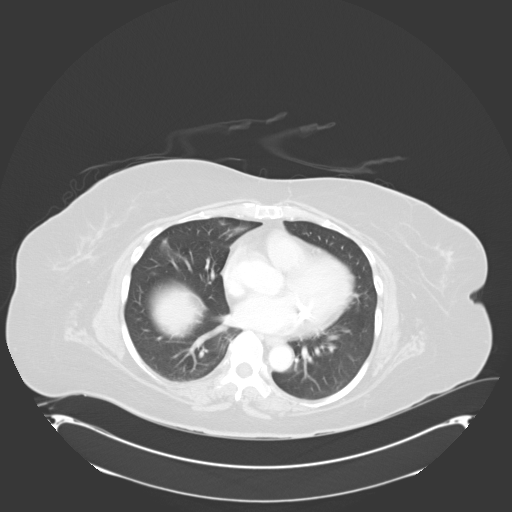

[14 of 32 positions shown; findings below may reference images not displayed]

FINDINGS: Lower chest: Mild bilateral lung base atelectasis. No pleural
effusions identified. There is mitral valve calcification.

Hepatobiliary: Negative

Pancreas: Pancreas is normal

Spleen: Spleen is normal

Adrenals/Urinary Tract: The adrenal glands are normal. Right kidney
is normal. Mild left hydroureter again identified due to pelvic
adenopathy. Dilatation of the left renal pelvis and ureter mildly
less prominent when compared to 08/11/2015.

Stomach/Bowel: Status post sleeve gastrectomy. Nonobstructive bowel
gas pattern. Distal large bowel diverticulosis again identified.

Vascular/Lymphatic: No acute vascular abnormalities. Stable upper
aortocaval 14 mm lymph node. Stable 43 x 34 mm partially necrotic
lymph node mass in the aortocaval region at the level of the lower
pole the kidneys. Stable 22 mm lymph node adjacent to the left
common iliac artery. Stable 14 mm pelvic sidewall lymph node along
the inner margin of the right acetabulum.

Reproductive: Uterus is absent.  No pelvic masses.

Other: Since 08/11/2015 a second pelvic drain has been placed. The
previous right inferior anterior pelvic drain is again identified,
and the air-fluid level containing structure associated with it has
decreased in size significantly to about 3 cm in diameter. The left
anterior pelvic drain, with tip extending obliquely into the right
posterior pelvis, has been placed with significant reduction in the
size of abscess to which it was targeted. This component of the
abscess now measures only about 2 cm. In total the abscess
previously measured 7 x 11 cm. The abscess is now difficult to
measure as 1 abnormality as the anterior and posterior components
described above no longer demonstrate obvious acute indication. Seen
best on image number 68, just cephalad to the course of the initial
right-sided pelvic drain, there is a persistent 4 cm abscess in the
anterior right lower quadrant. This is unchanged.

Musculoskeletal: There are no acute musculoskeletal findings
IMPRESSION: Lobulated pelvic abscess previously identified is now markedly
smaller, with unchanged position of previous right-sided drain and
significant reduction in size of fluid collection associated with
it, and placement of second left-sided pelvic drain with market
reduction in the component of the abscess as well. A third,
approximately 4 cm abscess component in the right lower quadrant
anteriorly is unchanged.

## 2016-04-11 IMAGING — XA IR CATHETER TUBE CHANGE
1 series · 2 of 2 positions shown · non-contrast
Comparison: Multiple prior CT, most recent 08/22/2015

INDICATION: 59-year-old female with a history of endometrial sarcoma with
diagnosis July 2014.

She has had recent abscess drainage with percutaneous drainage
performed from the right lateral pelvis/ abdomen 07/31/2015, and
then from the left anterior 08/12/2015.
EXAM:
SINUS TRACT INJECTION / FISTULOGRAM

[Series 300: tube placements · 2 of 2 slices shown]
[im 1/2]
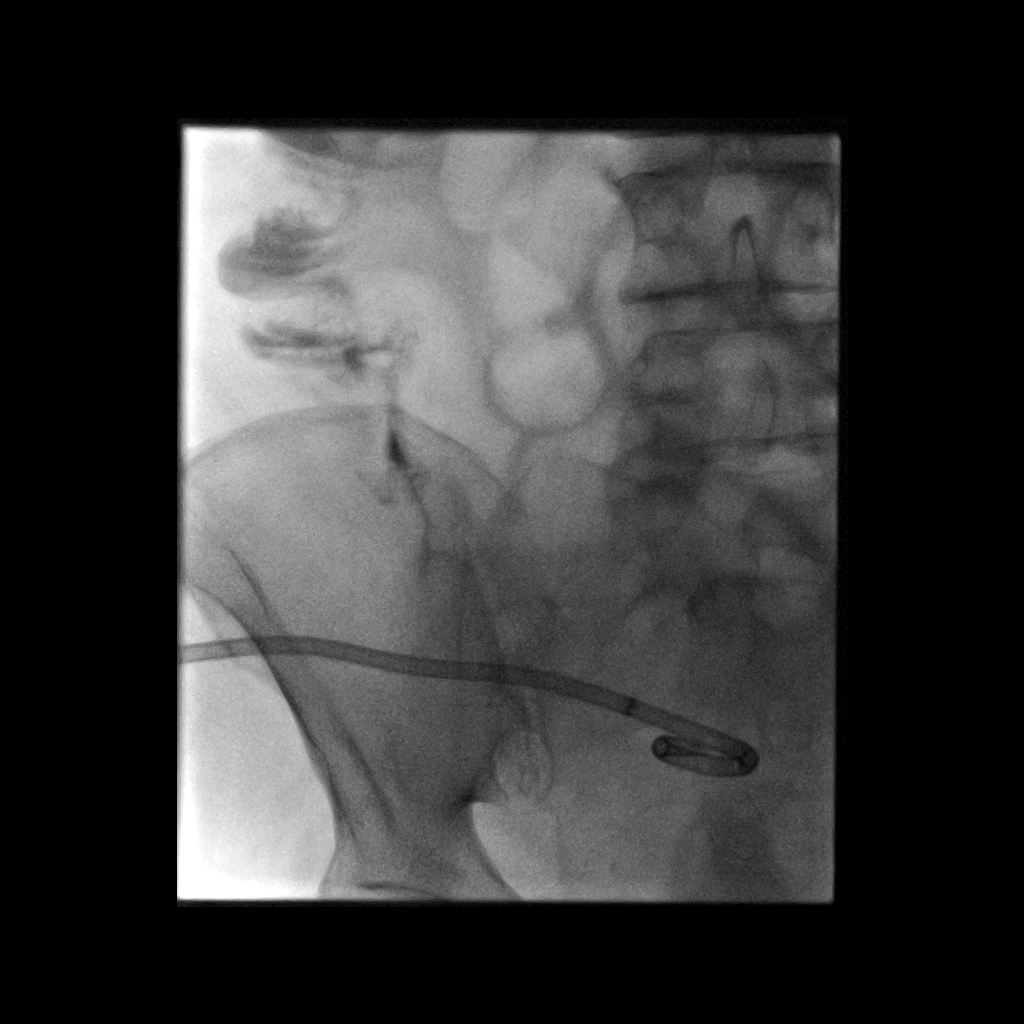
[im 2/2]
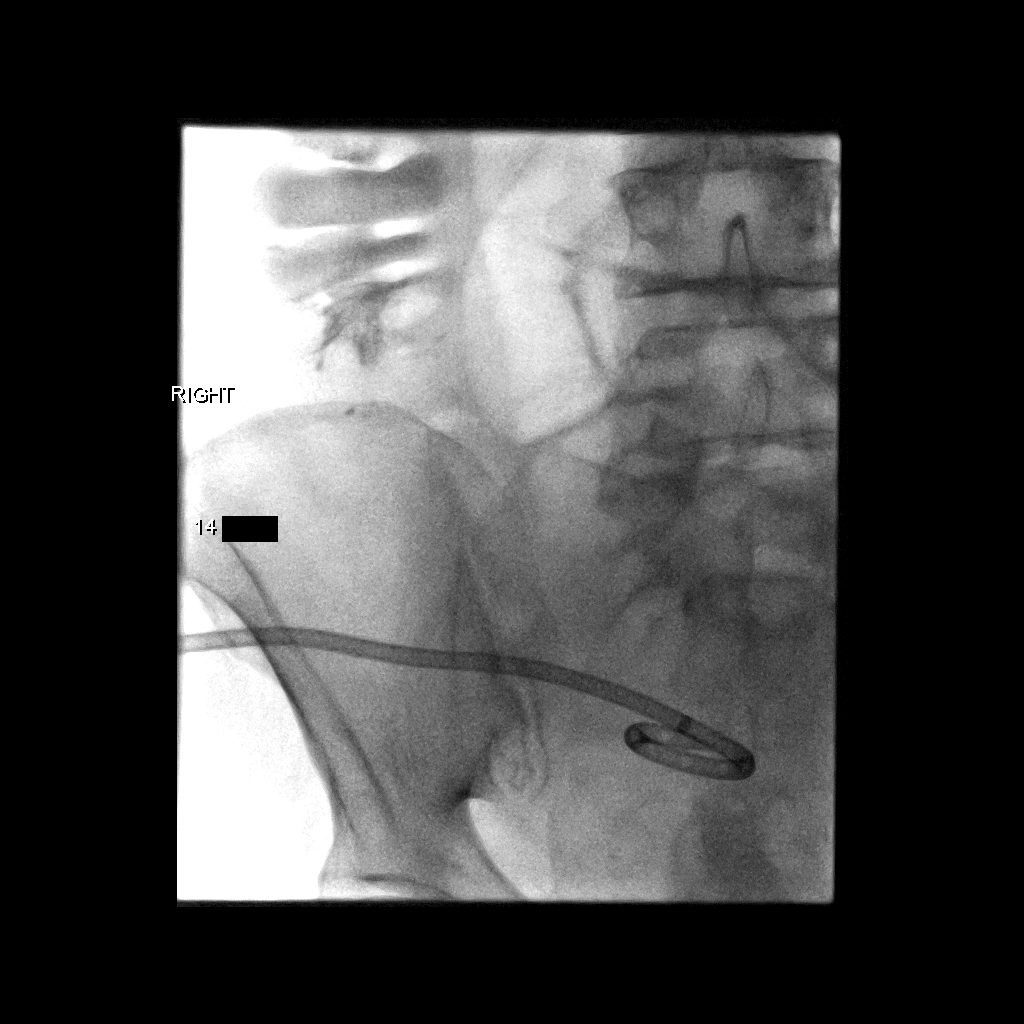

[2 of 2 positions shown; findings below may reference images not displayed]

MEDICATIONS:
No additional medications

ANESTHESIA/SEDATION:
None

COMPLICATIONS:
None

PROCEDURE:
Informed written consent was obtained from the patient after a
thorough discussion of the procedural risks, benefits and
alternatives. All questions were addressed. Maximal Sterile Barrier
Technique was utilized including caps, mask, sterile gowns, sterile
gloves, sterile drape, hand hygiene and skin antiseptic. A timeout
was performed prior to the initiation of the procedure.

Scout images were performed.

The skin and subcutaneous tissues surrounding the indwelling drain
were generously infiltrated 1% lidocaine for local anesthesia.

The indwelling drain was injected with contrast with images stored.

The catheter was ligated and a 035 Amplatz wire was advanced into
abdominal collection. Using modified Seldinger technique, the
indwelling drain was removed any new 14 French pigtail catheter was
advanced into the collection. Pigtail catheter was formed, and 30 cc
of frankly feculent material was aspirated.

Approximately 180 cc of sterile saline was used to your a gate the
tube in the cavity, with aspiration of the infused fluid.

Catheter was sutured in position and attached to gravity drainage.

Patient tolerated the procedure well and remained hemodynamically
stable throughout.

No complications were encountered and no significant blood loss
encountered.
FINDINGS: Injection of the indwelling tube demonstrates a clear fistula to the
cecum/right colon.

Residual abscess cavity.

New 14 French pigtail catheter into the right abdomen.
IMPRESSION: Status post injection of the indwelling catheter of the right low
abdomen/ pelvis, confirming a fistula to the right colon/cecum. The
drain was then exchanged for a 14 French pigtail catheter.

## 2016-04-11 IMAGING — DX DG ABD PORTABLE 1V
2 series · 2 of 2 positions shown · non-contrast
Comparison: None.

CLINICAL DATA: Sudden nausea and vomiting, concern clinically for
sepsis

EXAM:
PORTABLE ABDOMEN - 1 VIEW

[abdomen kub]
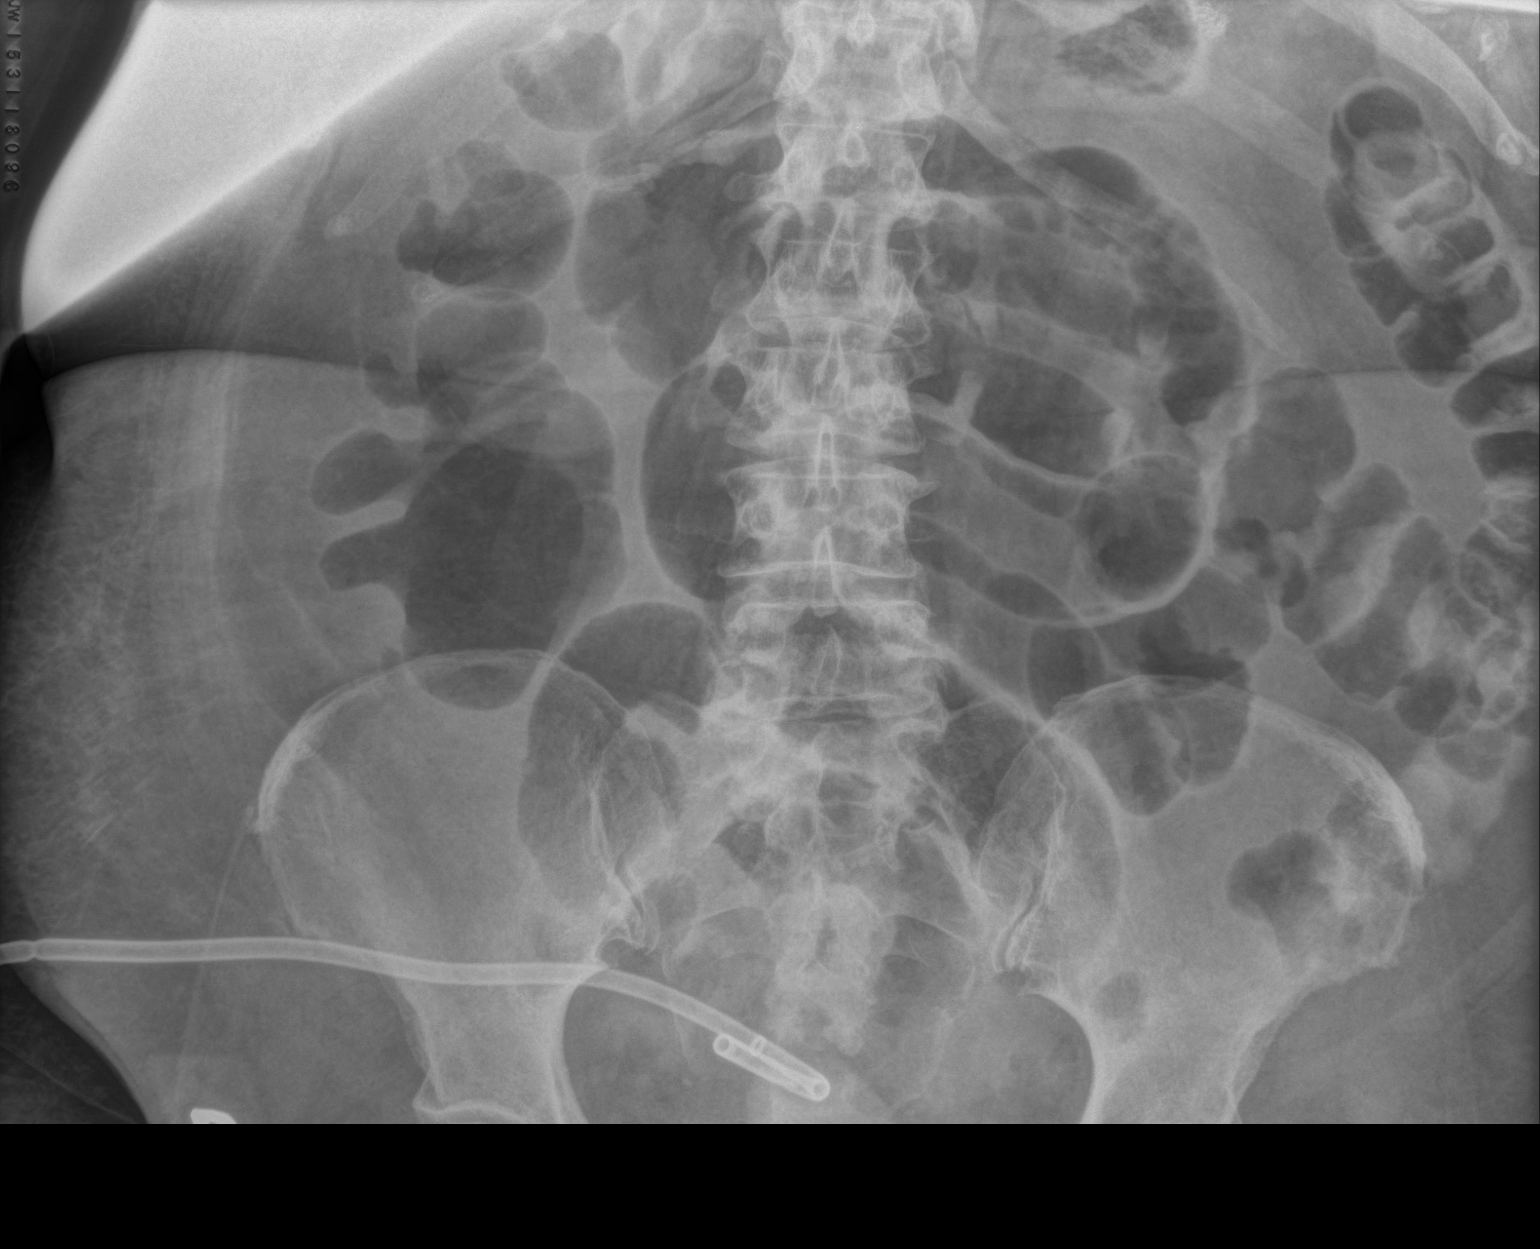

[abdomen supine]
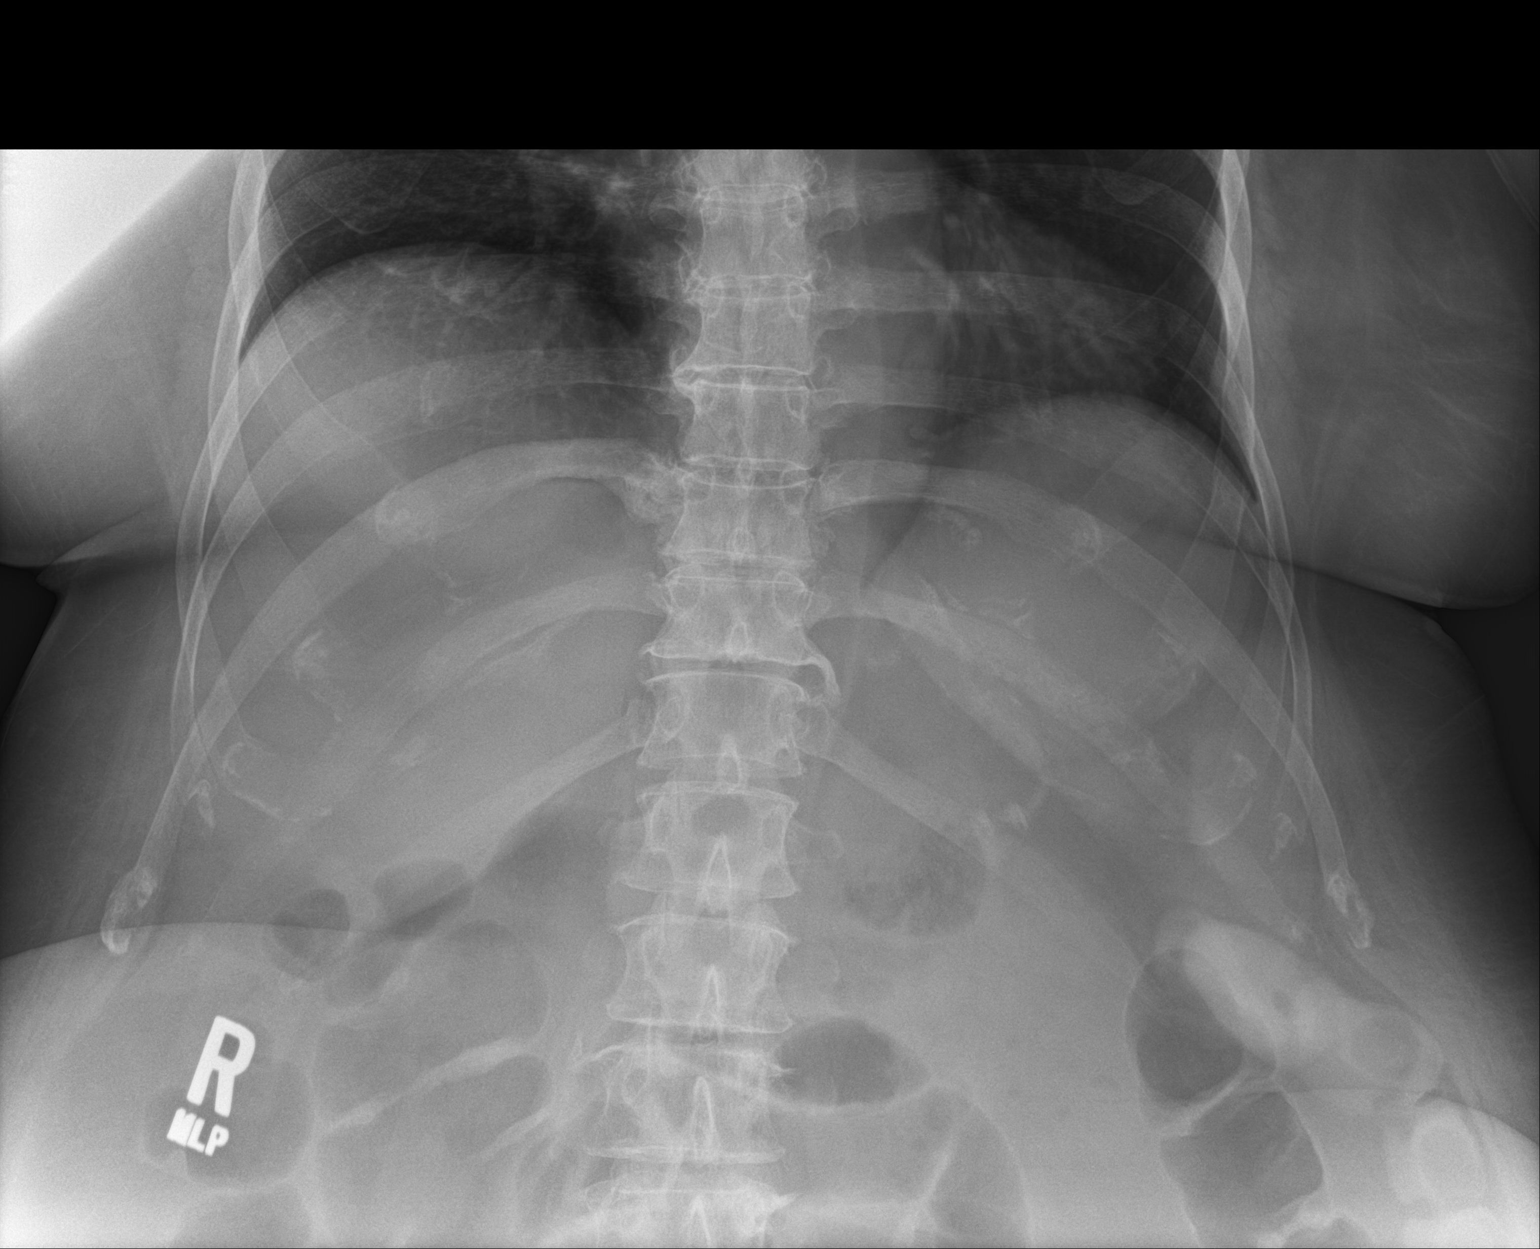

[2 of 2 positions shown; findings below may reference images not displayed]

FINDINGS: Pigtail catheter right lower quadrant again noted. Gas throughout
small large bowel.
IMPRESSION: The top catheter again noted. No specific radiographic
abnormalities.

## 2017-03-17 ENCOUNTER — Other Ambulatory Visit: Payer: Self-pay | Admitting: Nurse Practitioner
